# Patient Record
Sex: Male | Born: 1955
Health system: Southern US, Community
[De-identification: ages and names within clinical notes are randomized; demographics above are authoritative.]

## PROBLEM LIST (undated history)

## (undated) DIAGNOSIS — E785 Hyperlipidemia, unspecified: Secondary | ICD-10-CM

## (undated) DIAGNOSIS — I219 Acute myocardial infarction, unspecified: Secondary | ICD-10-CM

## (undated) DIAGNOSIS — Z9289 Personal history of other medical treatment: Secondary | ICD-10-CM

## (undated) DIAGNOSIS — G4730 Sleep apnea, unspecified: Secondary | ICD-10-CM

## (undated) DIAGNOSIS — I480 Paroxysmal atrial fibrillation: Secondary | ICD-10-CM

## (undated) DIAGNOSIS — K219 Gastro-esophageal reflux disease without esophagitis: Secondary | ICD-10-CM

## (undated) DIAGNOSIS — I4892 Unspecified atrial flutter: Secondary | ICD-10-CM

## (undated) DIAGNOSIS — I10 Essential (primary) hypertension: Secondary | ICD-10-CM

## (undated) DIAGNOSIS — I2510 Atherosclerotic heart disease of native coronary artery without angina pectoris: Secondary | ICD-10-CM

## (undated) DIAGNOSIS — I4510 Unspecified right bundle-branch block: Secondary | ICD-10-CM

## (undated) DIAGNOSIS — E1165 Type 2 diabetes mellitus with hyperglycemia: Secondary | ICD-10-CM

## (undated) HISTORY — DX: Hyperlipidemia, unspecified: E78.5

## (undated) HISTORY — DX: Type 2 diabetes mellitus with hyperglycemia: E11.65

## (undated) HISTORY — DX: Gastro-esophageal reflux disease without esophagitis: K21.9

## (undated) HISTORY — DX: Unspecified right bundle-branch block: I45.10

## (undated) HISTORY — DX: Paroxysmal atrial fibrillation: I48.0

## (undated) HISTORY — DX: Atherosclerotic heart disease of native coronary artery without angina pectoris: I25.10

## (undated) HISTORY — DX: Unspecified atrial flutter: I48.92

## (undated) HISTORY — DX: Essential (primary) hypertension: I10

---

## 2004-11-04 ENCOUNTER — Ambulatory Visit: Payer: Self-pay | Admitting: Internal Medicine

## 2009-06-28 ENCOUNTER — Emergency Department: Payer: Self-pay

## 2010-07-04 DIAGNOSIS — I2510 Atherosclerotic heart disease of native coronary artery without angina pectoris: Secondary | ICD-10-CM

## 2010-07-04 HISTORY — DX: Atherosclerotic heart disease of native coronary artery without angina pectoris: I25.10

## 2011-08-02 ENCOUNTER — Inpatient Hospital Stay: Payer: Self-pay | Admitting: *Deleted

## 2011-08-02 LAB — CBC
HCT: 43.1 % (ref 40.0–52.0)
HGB: 14.3 g/dL (ref 13.0–18.0)
MCH: 31.1 pg (ref 26.0–34.0)
MCHC: 33.2 g/dL (ref 32.0–36.0)
MCV: 94 fL (ref 80–100)
Platelet: 265 10*3/uL (ref 150–440)
RBC: 4.59 10*6/uL (ref 4.40–5.90)
RDW: 13.2 % (ref 11.5–14.5)
WBC: 11 10*3/uL — ABNORMAL HIGH (ref 3.8–10.6)

## 2011-08-02 LAB — BASIC METABOLIC PANEL
BUN: 15 mg/dL (ref 7–18)
Creatinine: 0.97 mg/dL (ref 0.60–1.30)
EGFR (African American): >60
Glucose: 106 mg/dL — ABNORMAL HIGH (ref 65–99)
Potassium: 3.7 mmol/L (ref 3.5–5.1)

## 2011-08-02 LAB — CK TOTAL AND CKMB (NOT AT ARMC)
CK, Total: 212 U/L (ref 35–232)
CK-MB: 6.4 ng/mL — ABNORMAL HIGH (ref 0.5–3.6)

## 2011-08-02 LAB — TROPONIN I: Troponin-I: 0.56 ng/mL — ABNORMAL HIGH

## 2011-08-03 DIAGNOSIS — I517 Cardiomegaly: Secondary | ICD-10-CM

## 2011-08-03 DIAGNOSIS — I2510 Atherosclerotic heart disease of native coronary artery without angina pectoris: Secondary | ICD-10-CM | POA: Insufficient documentation

## 2011-08-03 DIAGNOSIS — R748 Abnormal levels of other serum enzymes: Secondary | ICD-10-CM

## 2011-08-03 DIAGNOSIS — R079 Chest pain, unspecified: Secondary | ICD-10-CM

## 2011-08-03 HISTORY — PX: CARDIAC CATHETERIZATION: SHX172

## 2011-08-03 LAB — LIPID PANEL
HDL Cholesterol: 49 mg/dL (ref 40–60)
Triglycerides: 89 mg/dL (ref 0–200)
VLDL Cholesterol, Calc: 18 mg/dL (ref 5–40)

## 2011-08-03 LAB — CBC WITH DIFFERENTIAL/PLATELET
Basophil #: 0 10*3/uL (ref 0.0–0.1)
Basophil %: 0.2 %
Eosinophil #: 0.4 10*3/uL (ref 0.0–0.7)
HCT: 39 % — ABNORMAL LOW (ref 40.0–52.0)
HGB: 13.1 g/dL (ref 13.0–18.0)
Lymphocyte #: 2.4 10*3/uL (ref 1.0–3.6)
Lymphocyte %: 23.6 %
MCHC: 33.7 g/dL (ref 32.0–36.0)
MCV: 94 fL (ref 80–100)
Neutrophil #: 6.4 10*3/uL (ref 1.4–6.5)
Neutrophil %: 63.2 %
Platelet: 250 10*3/uL (ref 150–440)
RDW: 13.1 % (ref 11.5–14.5)

## 2011-08-03 LAB — BASIC METABOLIC PANEL
BUN: 15 mg/dL (ref 7–18)
Calcium, Total: 8.6 mg/dL (ref 8.5–10.1)
Co2: 24 mmol/L (ref 21–32)
EGFR (African American): >60
EGFR (Non-African Amer.): >60
Glucose: 216 mg/dL — ABNORMAL HIGH (ref 65–99)
Potassium: 3.9 mmol/L (ref 3.5–5.1)
Sodium: 137 mmol/L (ref 136–145)

## 2011-08-03 LAB — CK TOTAL AND CKMB (NOT AT ARMC)
CK, Total: 133 U/L (ref 35–232)
CK, Total: 105 U/L (ref 35–232)
CK-MB: 3.3 ng/mL (ref 0.5–3.6)
CK-MB: 1.8 ng/mL (ref 0.5–3.6)

## 2011-08-03 LAB — HEMOGLOBIN A1C: Hemoglobin A1C: 7.4 % — ABNORMAL HIGH (ref 4.2–6.3)

## 2011-08-03 LAB — MAGNESIUM: Magnesium: 1.6 mg/dL — ABNORMAL LOW

## 2011-08-03 LAB — TROPONIN I: Troponin-I: 0.36 ng/mL — ABNORMAL HIGH

## 2011-08-04 DIAGNOSIS — I214 Non-ST elevation (NSTEMI) myocardial infarction: Secondary | ICD-10-CM

## 2011-08-10 ENCOUNTER — Telehealth: Payer: Self-pay | Admitting: Cardiovascular Disease

## 2011-08-10 ENCOUNTER — Observation Stay: Payer: Self-pay | Admitting: Internal Medicine

## 2011-08-10 LAB — TROPONIN I: Troponin-I: <0.02 ng/mL

## 2011-08-10 LAB — BASIC METABOLIC PANEL
Anion Gap: 6 — ABNORMAL LOW (ref 7–16)
Calcium, Total: 9.3 mg/dL (ref 8.5–10.1)
Creatinine: 0.95 mg/dL (ref 0.60–1.30)
EGFR (African American): >60
Osmolality: 280 (ref 275–301)
Potassium: 4.1 mmol/L (ref 3.5–5.1)
Sodium: 139 mmol/L (ref 136–145)

## 2011-08-10 LAB — CBC
HCT: 40.5 % (ref 40.0–52.0)
HGB: 13.5 g/dL (ref 13.0–18.0)
MCH: 31.5 pg (ref 26.0–34.0)
MCV: 94 fL (ref 80–100)
Platelet: 251 10*3/uL (ref 150–440)
RDW: 13.1 % (ref 11.5–14.5)
WBC: 9 10*3/uL (ref 3.8–10.6)

## 2011-08-10 LAB — CK TOTAL AND CKMB (NOT AT ARMC): CK-MB: 2.2 ng/mL (ref 0.5–3.6)

## 2011-08-10 NOTE — Telephone Encounter (Signed)
Complaining of a "nagging pain" most of the day.  He states he was hospitalized last week and has been having the pain off and on since that time.  He has tried taking one ntg without relief.  The pain is in the center of his chest, dull (3-/10) lasting about 2-3 hours.  Eating helps relieve the pain.  TUMS may help a little.  BP is running 120-135/60-70.  HR is 50's.  No sob.  No weakness.  No dizziness.  No sweating.   Walking does make the pain worse.  Pain does not seem to bother him when he lays down at night.  He is not having the pain currently.

## 2011-08-10 NOTE — Telephone Encounter (Signed)
Still having some chest pain and wants to speak w/RN.  PT is post hospital

## 2011-08-10 NOTE — Telephone Encounter (Signed)
Perhaps he could try over-the-counter omeprazole twice a day Okay to take Pepcid or Zantac generic version with omeprazole Will probably need followup in the clinic. 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 Medical management recommended

## 2011-08-11 DIAGNOSIS — I517 Cardiomegaly: Secondary | ICD-10-CM

## 2011-08-11 DIAGNOSIS — R079 Chest pain, unspecified: Secondary | ICD-10-CM

## 2011-08-11 LAB — BASIC METABOLIC PANEL
Anion Gap: 8 (ref 7–16)
Calcium, Total: 8.4 mg/dL — ABNORMAL LOW (ref 8.5–10.1)
Creatinine: 1.02 mg/dL (ref 0.60–1.30)
EGFR (African American): >60
Glucose: 314 mg/dL — ABNORMAL HIGH (ref 65–99)
Potassium: 4.3 mmol/L (ref 3.5–5.1)
Sodium: 137 mmol/L (ref 136–145)

## 2011-08-11 LAB — CK TOTAL AND CKMB (NOT AT ARMC)
CK, Total: 97 U/L (ref 35–232)
CK-MB: 1.6 ng/mL (ref 0.5–3.6)
CK-MB: 1.6 ng/mL (ref 0.5–3.6)

## 2011-08-11 LAB — CBC WITH DIFFERENTIAL/PLATELET
Basophil #: 0 10*3/uL (ref 0.0–0.1)
Basophil %: 0.4 %
Eosinophil %: 4.4 %
HGB: 12.9 g/dL — ABNORMAL LOW (ref 13.0–18.0)
MCH: 31.3 pg (ref 26.0–34.0)
Monocyte #: 1 x10 3/mm (ref 0.2–1.0)
Monocyte %: 10.9 %
Neutrophil #: 5 10*3/uL (ref 1.4–6.5)
Neutrophil %: 53.4 %
RDW: 13.1 % (ref 11.5–14.5)
WBC: 9.4 10*3/uL (ref 3.8–10.6)

## 2011-08-17 ENCOUNTER — Encounter: Payer: Self-pay | Admitting: Cardiovascular Disease

## 2011-08-19 ENCOUNTER — Ambulatory Visit (INDEPENDENT_AMBULATORY_CARE_PROVIDER_SITE_OTHER): Payer: 59 | Admitting: Cardiovascular Disease

## 2011-08-19 ENCOUNTER — Encounter: Payer: Self-pay | Admitting: Cardiovascular Disease

## 2011-08-19 VITALS — BP 132/70 | HR 58 | Resp 18 | Ht 71.0 in | Wt 224.4 lb

## 2011-08-19 DIAGNOSIS — E1165 Type 2 diabetes mellitus with hyperglycemia: Secondary | ICD-10-CM | POA: Diagnosis present

## 2011-08-19 DIAGNOSIS — I10 Essential (primary) hypertension: Secondary | ICD-10-CM | POA: Insufficient documentation

## 2011-08-19 DIAGNOSIS — R079 Chest pain, unspecified: Secondary | ICD-10-CM

## 2011-08-19 DIAGNOSIS — I2510 Atherosclerotic heart disease of native coronary artery without angina pectoris: Secondary | ICD-10-CM

## 2011-08-19 DIAGNOSIS — E785 Hyperlipidemia, unspecified: Secondary | ICD-10-CM

## 2011-08-19 DIAGNOSIS — E119 Type 2 diabetes mellitus without complications: Secondary | ICD-10-CM

## 2011-08-19 MED ORDER — METOPROLOL TARTRATE 25 MG PO TABS: 12.5000 mg | ORAL_TABLET | Freq: Two times a day (BID) | ORAL | Status: DC

## 2011-08-19 MED ORDER — CLOPIDOGREL BISULFATE 75 MG PO TABS: 75.0000 mg | ORAL_TABLET | Freq: Every day | ORAL | Status: DC

## 2011-08-19 MED ORDER — ISOSORBIDE MONONITRATE ER 30 MG PO TB24: 30.0000 mg | ORAL_TABLET | Freq: Every day | ORAL | Status: DC

## 2011-08-19 MED ORDER — RANOLAZINE ER 1000 MG PO TB12: 1000.0000 mg | ORAL_TABLET | Freq: Two times a day (BID) | ORAL | Status: DC

## 2011-08-19 MED ORDER — ATORVASTATIN CALCIUM 40 MG PO TABS: 40.0000 mg | ORAL_TABLET | Freq: Every day | ORAL | Status: DC

## 2011-08-19 MED ORDER — BENAZEPRIL HCL 20 MG PO TABS: 20.0000 mg | ORAL_TABLET | Freq: Every day | ORAL | Status: DC

## 2011-08-19 NOTE — Assessment & Plan Note (Signed)
Severe left circumflex disease with collaterals. Medical management recommended.

## 2011-08-19 NOTE — Progress Notes (Signed)
Patient ID: Douglas Hunter, male    DOB: 10-Jan-1956, 56 y.o.   MRN: [id number]  HPI Comments: Douglas Hunter is a pleasant 56 year old gentleman with recent history of chest pain, non-STEMI with admission to the hospital in late April with cardiac catheterization showing severe mid left circumflex disease, subtotally occluded, with collaterals from the RCA to the LAD circumflex, normal ejection fraction who read presented to the hospital shortly later with chest pain. He was started on nitrates and ranexa for his symptoms.   He presents today and reports that he feels well. He denies any further chest pain. He is uncertain if symptoms were made better by starting proton pump inhibitor or ranexa. He is otherwise active. He continues to take isosorbide 30 mg daily. He is tolerating his statin well. He reports that his hemoglobin A1c is 7.3.  His case was discussed at the cardiac catheterization conference and medical management was recommended.  Echocardiogram shows normal LV systolic function, essentially normal study, May 2013  EKG today shows normal sinus rhythm with right bundle branch block, rate 52 beats per minute  Outpatient Encounter Prescriptions as of 08/19/2011  Medication Sig Dispense Refill  . aspirin 81 MG tablet Take 81 mg by mouth daily.      Marland Kitchen atorvastatin (LIPITOR) 40 MG tablet Take 1 tablet (40 mg total) by mouth daily.  30 tablet  11  . benazepril (LOTENSIN) 20 MG tablet Take 1 tablet (20 mg total) by mouth daily.  30 tablet  11  . clopidogrel (PLAVIX) 75 MG tablet Take 1 tablet (75 mg total) by mouth daily.  30 tablet  11  . insulin lispro protamine-insulin lispro (HUMALOG 75/25) (75-25) 100 UNIT/ML SUSP Inject 50-55 Units into the skin daily. 55 units in the am and 50 units in the pm.      . isosorbide mononitrate (IMDUR) 30 MG 24 hr tablet Take 1 tablet (30 mg total) by mouth daily.  30 tablet  11  . NOVOFINE 30G X 8 MM MISC       . omeprazole (PRILOSEC) 20 MG capsule Take 20  mg by mouth daily.       . ONE TOUCH ULTRA TEST test strip       . ranolazine (RANEXA) 1000 MG SR tablet Take 1 tablet (1,000 mg total) by mouth 2 (two) times daily.  60 tablet  11  . DISCONTD: atorvastatin (LIPITOR) 40 MG tablet Take 40 mg by mouth daily.      Marland Kitchen DISCONTD: benazepril (LOTENSIN) 20 MG tablet Take 20 mg by mouth daily.      Marland Kitchen DISCONTD: clopidogrel (PLAVIX) 75 MG tablet Take 75 mg by mouth daily.      Marland Kitchen DISCONTD: isosorbide mononitrate (IMDUR) 30 MG 24 hr tablet Take 30 mg by mouth daily.      Marland Kitchen DISCONTD: RANEXA 500 MG 12 hr tablet       . metoprolol tartrate (LOPRESSOR) 25 MG tablet Take 0.5 tablets (12.5 mg total) by mouth 2 (two) times daily.  90 tablet  3  . VIAGRA 100 MG tablet         Review of Systems  Constitutional: Negative.   HENT: Negative.   Eyes: Negative.   Respiratory: Negative.   Cardiovascular: Negative.   Gastrointestinal: Negative.   Musculoskeletal: Negative.   Skin: Negative.   Neurological: Negative.   Hematological: Negative.   Psychiatric/Behavioral: Negative.   All other systems reviewed and are negative.   BP 132/70  Pulse 58  Resp 18  Ht 5\' 11"  (1.803 m)  Wt 224 lb 6.4 oz (101.787 kg)  BMI 31.30 kg/m2  SpO2 95%  Physical Exam  Nursing note and vitals reviewed. Constitutional: He is oriented to person, place, and time. He appears well-developed and well-nourished.  HENT:  Head: Normocephalic.  Nose: Nose normal.  Mouth/Throat: Oropharynx is clear and moist.  Eyes: Conjunctivae are normal. Pupils are equal, round, and reactive to light.  Neck: Normal range of motion. Neck supple. No JVD present.  Cardiovascular: Normal rate, regular rhythm, S1 normal, S2 normal, normal heart sounds and intact distal pulses.  Exam reveals no gallop and no friction rub.   No murmur heard. Pulmonary/Chest: Effort normal and breath sounds normal. No respiratory distress. He has no wheezes. He has no rales. He exhibits no tenderness.  Abdominal: Soft.  Bowel sounds are normal. He exhibits no distension. There is no tenderness.  Musculoskeletal: Normal range of motion. He exhibits no edema and no tenderness.  Lymphadenopathy:    He has no cervical adenopathy.  Neurological: He is alert and oriented to person, place, and time. Coordination normal.  Skin: Skin is warm and dry. No rash noted. No erythema.  Psychiatric: He has a normal mood and affect. His behavior is normal. Judgment and thought content normal.           Assessment and Plan

## 2011-08-19 NOTE — Assessment & Plan Note (Signed)
He reports having followup with Dr. Dossie Arbour for routine blood work in the next few months. Call LDL less than 70 .

## 2011-08-19 NOTE — Assessment & Plan Note (Signed)
No recent chest pain. We have suggested he could hold his ranexa for a trial to see if his chest pain will recur. If chest pain does come back, we would continue him on 1000 mg twice a day.

## 2011-08-19 NOTE — Assessment & Plan Note (Signed)
Blood pressure is well controlled on today's visit. No changes made to the medications. 

## 2011-08-19 NOTE — Patient Instructions (Addendum)
You are doing well. No medication changes were made.  You can do a trial without the ranexa  Ranexa is used for chest pain If you continue to have chest pain, restart the ranexa  Please call us if you have new issues that need to be addressed before your next appt.  Your physician wants you to follow-up in: 6 months.  You will receive a reminder letter in the mail two months in advance. If you don't receive a letter, please call our office to schedule the follow-up appointment.

## 2011-10-10 ENCOUNTER — Other Ambulatory Visit: Payer: Self-pay | Admitting: Cardiovascular Disease

## 2011-10-10 NOTE — Telephone Encounter (Signed)
Refilled Omeprazole 

## 2012-02-20 ENCOUNTER — Encounter: Payer: Self-pay | Admitting: Cardiovascular Disease

## 2012-02-20 ENCOUNTER — Ambulatory Visit (INDEPENDENT_AMBULATORY_CARE_PROVIDER_SITE_OTHER): Payer: 59 | Admitting: Cardiovascular Disease

## 2012-02-20 VITALS — BP 142/64 | HR 57 | Ht 71.0 in | Wt 239.2 lb

## 2012-02-20 DIAGNOSIS — I499 Cardiac arrhythmia, unspecified: Secondary | ICD-10-CM

## 2012-02-20 DIAGNOSIS — I10 Essential (primary) hypertension: Secondary | ICD-10-CM

## 2012-02-20 DIAGNOSIS — I2510 Atherosclerotic heart disease of native coronary artery without angina pectoris: Secondary | ICD-10-CM

## 2012-02-20 DIAGNOSIS — E785 Hyperlipidemia, unspecified: Secondary | ICD-10-CM

## 2012-02-20 DIAGNOSIS — E119 Type 2 diabetes mellitus without complications: Secondary | ICD-10-CM

## 2012-02-20 MED ORDER — NITROGLYCERIN 0.4 MG SL SUBL: 0.4000 mg | SUBLINGUAL_TABLET | SUBLINGUAL | Status: DC | PRN

## 2012-02-20 NOTE — Assessment & Plan Note (Signed)
We have encouraged continued exercise, careful diet management in an effort to lose weight. 

## 2012-02-20 NOTE — Assessment & Plan Note (Signed)
Blood pressure is adequate on today's visit. No changes made to the medications.  

## 2012-02-20 NOTE — Patient Instructions (Addendum)
You are doing well. No medication changes were made.  For palpitations, take extra metoprolol whole pill (25 mg)  Please call us if you have new issues that need to be addressed before your next appt.  Your physician wants you to follow-up in: 6 months.  You will receive a reminder letter in the mail two months in advance. If you don't receive a letter, please call our office to schedule the follow-up appointment.

## 2012-02-20 NOTE — Assessment & Plan Note (Signed)
Continue statin. Goal LDL less than 70 

## 2012-02-20 NOTE — Progress Notes (Signed)
Patient ID: Douglas Hunter, male    DOB: 10-20-1955, 56 y.o.   MRN: [id number]  HPI Comments: Douglas Hunter is a pleasant 55 year old gentleman with CAD, non-STEMI with admission to the hospital in late April 2012 with cardiac catheterization showing severe mid left circumflex disease, subtotally occluded, with collaterals from the RCA to the LAD circumflex, normal ejection fraction who represented to the hospital shortly later with chest pain. He was started on nitrates and ranexa for his symptoms.   He presents today and reports that he feels well. He denies any further chest pain. He is otherwise active. He continues to take isosorbide 30 mg daily. He is tolerating his statin well. He does not exercise on a regular basis he has been having some palpitations at nighttime, rarely in the daytime. This is rare, sometimes lasting for 30 minutes Rare chest pain episodes at rest.  sometimes has to take nitroglycerin  He reports that his hemoglobin A1c is greater than 8, up from 7.3  Echocardiogram shows normal LV systolic function, essentially normal study, May 2013  EKG today shows normal sinus rhythm with right bundle branch block, rate 57 beats per minute  Outpatient Encounter Prescriptions as of 02/20/2012  Medication Sig Dispense Refill  . aspirin 81 MG tablet Take 81 mg by mouth daily.      Marland Kitchen atorvastatin (LIPITOR) 40 MG tablet Take 1 tablet (40 mg total) by mouth daily.  30 tablet  11  . benazepril (LOTENSIN) 20 MG tablet Take 1 tablet (20 mg total) by mouth daily.  30 tablet  11  . clopidogrel (PLAVIX) 75 MG tablet Take 1 tablet (75 mg total) by mouth daily.  30 tablet  11  . insulin lispro protamine-insulin lispro (HUMALOG 75/25) (75-25) 100 UNIT/ML SUSP Inject 50-55 Units into the skin daily. 55 units in the am and 50 units in the pm.      . isosorbide mononitrate (IMDUR) 30 MG 24 hr tablet Take 1 tablet (30 mg total) by mouth daily.  30 tablet  11  . metoprolol tartrate (LOPRESSOR) 25 MG  tablet Take 0.5 tablets (12.5 mg total) by mouth 2 (two) times daily.  90 tablet  3  . NOVOFINE 30G X 8 MM MISC       . omeprazole (PRILOSEC) 20 MG capsule TAKE 1 TABLET BY MOUTH EVERY DAY  30 capsule  5  . ONE TOUCH ULTRA TEST test strip       . ranolazine (RANEXA) 1000 MG SR tablet Take 1 tablet (1,000 mg total) by mouth 2 (two) times daily.  60 tablet  11  . VIAGRA 100 MG tablet Take 100 mg by mouth as needed.       . nitroGLYCERIN (NITROSTAT) 0.4 MG SL tablet Place 1 tablet (0.4 mg total) under the tongue every 5 (five) minutes as needed for chest pain.  25 tablet  6     Review of Systems  Constitutional: Negative.   HENT: Negative.   Eyes: Negative.   Respiratory: Negative.   Cardiovascular: Positive for chest pain and palpitations.  Gastrointestinal: Negative.   Musculoskeletal: Negative.   Skin: Negative.   Neurological: Negative.   Hematological: Negative.   Psychiatric/Behavioral: Negative.   All other systems reviewed and are negative.   BP 142/64  Pulse 57  Ht 5\' 11"  (1.803 m)  Wt 239 lb 4 oz (108.523 kg)  BMI 33.37 kg/m2  Physical Exam  Nursing note and vitals reviewed. Constitutional: He is oriented to person, place, and time.  He appears well-developed and well-nourished.  HENT:  Head: Normocephalic.  Nose: Nose normal.  Mouth/Throat: Oropharynx is clear and moist.  Eyes: Conjunctivae normal are normal. Pupils are equal, round, and reactive to light.  Neck: Normal Hunter of motion. Neck supple. No JVD present.  Cardiovascular: Normal rate, regular rhythm, S1 normal, S2 normal, normal heart sounds and intact distal pulses.  Exam reveals no gallop and no friction rub.   No murmur heard. Pulmonary/Chest: Effort normal and breath sounds normal. No respiratory distress. He has no wheezes. He has no rales. He exhibits no tenderness.  Abdominal: Soft. Bowel sounds are normal. He exhibits no distension. There is no tenderness.  Musculoskeletal: Normal Hunter of motion.  He exhibits no edema and no tenderness.  Lymphadenopathy:    He has no cervical adenopathy.  Neurological: He is alert and oriented to person, place, and time. Coordination normal.  Skin: Skin is warm and dry. No rash noted. No erythema.  Psychiatric: He has a normal mood and affect. His behavior is normal. Judgment and thought content normal.           Assessment and Plan

## 2012-02-20 NOTE — Assessment & Plan Note (Signed)
Currently with no symptoms of angina. No further workup at this time. Continue current medication regimen. Atypical type chest pain at rest. We have encouraged him to call our office if symptoms change.

## 2012-08-03 ENCOUNTER — Other Ambulatory Visit: Payer: Self-pay | Admitting: *Deleted

## 2012-08-03 MED ORDER — METOPROLOL TARTRATE 25 MG PO TABS: 12.5000 mg | ORAL_TABLET | Freq: Two times a day (BID) | ORAL | Status: DC

## 2012-08-03 NOTE — Telephone Encounter (Signed)
Refilled Metoprolol sent to [facility] pharmacy.

## 2012-08-11 ENCOUNTER — Inpatient Hospital Stay: Payer: Self-pay | Admitting: Specialist

## 2012-08-11 DIAGNOSIS — R7989 Other specified abnormal findings of blood chemistry: Secondary | ICD-10-CM

## 2012-08-11 DIAGNOSIS — I4892 Unspecified atrial flutter: Secondary | ICD-10-CM

## 2012-08-11 LAB — COMPREHENSIVE METABOLIC PANEL
Albumin: 3.4 g/dL (ref 3.4–5.0)
Alkaline Phosphatase: 121 U/L (ref 50–136)
Anion Gap: 7 (ref 7–16)
BUN: 18 mg/dL (ref 7–18)
Bilirubin,Total: 1 mg/dL (ref 0.2–1.0)
Calcium, Total: 8.9 mg/dL (ref 8.5–10.1)
Co2: 24 mmol/L (ref 21–32)
Creatinine: 1.16 mg/dL (ref 0.60–1.30)
EGFR (Non-African Amer.): >60
Osmolality: 286 (ref 275–301)
SGOT(AST): 21 U/L (ref 15–37)

## 2012-08-11 LAB — CBC
HCT: 42.2 % (ref 40.0–52.0)
HGB: 14.6 g/dL (ref 13.0–18.0)
MCH: 31.7 pg (ref 26.0–34.0)
RBC: 4.6 10*6/uL (ref 4.40–5.90)
RDW: 13.4 % (ref 11.5–14.5)
WBC: 10 10*3/uL (ref 3.8–10.6)

## 2012-08-11 LAB — TROPONIN I: Troponin-I: 0.21 ng/mL — ABNORMAL HIGH

## 2012-08-11 LAB — PROTIME-INR: INR: 0.9

## 2012-08-11 LAB — TSH: Thyroid Stimulating Horm: 1.67 u[IU]/mL

## 2012-08-11 LAB — MAGNESIUM: Magnesium: 1.2 mg/dL — ABNORMAL LOW

## 2012-08-11 LAB — CK TOTAL AND CKMB (NOT AT ARMC): CK-MB: 5.2 ng/mL — ABNORMAL HIGH (ref 0.5–3.6)

## 2012-08-11 LAB — APTT: Activated PTT: 27.2 secs (ref 23.6–35.9)

## 2012-08-12 LAB — CK TOTAL AND CKMB (NOT AT ARMC)
CK, Total: 126 U/L (ref 35–232)
CK-MB: 6.6 ng/mL — ABNORMAL HIGH (ref 0.5–3.6)

## 2012-08-12 LAB — BASIC METABOLIC PANEL
Anion Gap: 4 — ABNORMAL LOW (ref 7–16)
BUN: 15 mg/dL (ref 7–18)
Calcium, Total: 8.5 mg/dL (ref 8.5–10.1)
EGFR (African American): >60
EGFR (Non-African Amer.): >60
Osmolality: 281 (ref 275–301)
Sodium: 137 mmol/L (ref 136–145)

## 2012-08-12 LAB — CBC WITH DIFFERENTIAL/PLATELET
Eosinophil #: 0.4 10*3/uL (ref 0.0–0.7)
Eosinophil %: 3.2 %
Lymphocyte %: 26.8 %
Monocyte %: 9 %
Neutrophil #: 6.5 10*3/uL (ref 1.4–6.5)
Neutrophil %: 58.2 %
Platelet: 221 10*3/uL (ref 150–440)
RDW: 13.5 % (ref 11.5–14.5)

## 2012-08-12 LAB — APTT: Activated PTT: 103.1 secs — ABNORMAL HIGH (ref 23.6–35.9)

## 2012-08-12 LAB — TROPONIN I: Troponin-I: 0.28 ng/mL — ABNORMAL HIGH

## 2012-08-12 LAB — LIPID PANEL
HDL Cholesterol: 51 mg/dL (ref 40–60)
Triglycerides: 78 mg/dL (ref 0–200)
VLDL Cholesterol, Calc: 16 mg/dL (ref 5–40)

## 2012-08-16 ENCOUNTER — Other Ambulatory Visit: Payer: Self-pay

## 2012-08-16 ENCOUNTER — Other Ambulatory Visit: Payer: 59

## 2012-08-16 ENCOUNTER — Other Ambulatory Visit (INDEPENDENT_AMBULATORY_CARE_PROVIDER_SITE_OTHER): Payer: 59

## 2012-08-16 DIAGNOSIS — I10 Essential (primary) hypertension: Secondary | ICD-10-CM

## 2012-08-16 DIAGNOSIS — I4892 Unspecified atrial flutter: Secondary | ICD-10-CM

## 2012-08-16 DIAGNOSIS — I2510 Atherosclerotic heart disease of native coronary artery without angina pectoris: Secondary | ICD-10-CM

## 2012-08-16 DIAGNOSIS — I499 Cardiac arrhythmia, unspecified: Secondary | ICD-10-CM

## 2012-08-16 DIAGNOSIS — E785 Hyperlipidemia, unspecified: Secondary | ICD-10-CM

## 2012-08-20 ENCOUNTER — Encounter: Payer: Self-pay | Admitting: Cardiovascular Disease

## 2012-08-20 ENCOUNTER — Ambulatory Visit (INDEPENDENT_AMBULATORY_CARE_PROVIDER_SITE_OTHER): Payer: 59 | Admitting: Cardiovascular Disease

## 2012-08-20 VITALS — BP 128/74 | HR 53 | Ht 71.0 in | Wt 244.5 lb

## 2012-08-20 DIAGNOSIS — R000 Tachycardia, unspecified: Secondary | ICD-10-CM

## 2012-08-20 DIAGNOSIS — I10 Essential (primary) hypertension: Secondary | ICD-10-CM

## 2012-08-20 DIAGNOSIS — I4892 Unspecified atrial flutter: Secondary | ICD-10-CM | POA: Insufficient documentation

## 2012-08-20 DIAGNOSIS — E119 Type 2 diabetes mellitus without complications: Secondary | ICD-10-CM

## 2012-08-20 DIAGNOSIS — E785 Hyperlipidemia, unspecified: Secondary | ICD-10-CM

## 2012-08-20 DIAGNOSIS — I2510 Atherosclerotic heart disease of native coronary artery without angina pectoris: Secondary | ICD-10-CM

## 2012-08-20 MED ORDER — METOPROLOL TARTRATE 25 MG PO TABS: 25.0000 mg | ORAL_TABLET | Freq: Two times a day (BID) | ORAL | Status: DC

## 2012-08-20 MED ORDER — DILTIAZEM HCL 30 MG PO TABS: 30.0000 mg | ORAL_TABLET | Freq: Four times a day (QID) | ORAL | Status: DC | PRN

## 2012-08-20 NOTE — Progress Notes (Signed)
Patient ID: Douglas Hunter, male    DOB: 1955-05-04, 57 y.o.   MRN: [id number]  HPI Comments: Douglas Hunter is a pleasant 57 year old gentleman with CAD, non-STEMI with admission to the hospital in late April 2012 with cardiac catheterization showing severe mid left circumflex disease, subtotally occluded, with collaterals from the RCA to the LAD circumflex, normal ejection fraction who represented to the hospital shortly later with chest pain. He was started on nitrates and ranexa for his symptoms.   He has had 4 episodes of atrial flutter. Most recently admitted to the hospital 08/11/2012 with atrial flutter. Flutter waves were seen after adenosine was given IV. He was started on diltiazem infusion and his rhythm broke to normal sinus rhythm. He presents today for followup. He was started on xarelto. Metoprolol was increased. Otherwise active with no complaints  Echocardiogram shows normal LV systolic function, essentially normal study, May 2013  Recent lab work shows LDL 81, hemoglobin A1c 8.1, total cholesterol 144, HDL 50 In the hospital total cholesterol 130, LDL 30, HDL 51  EKG today shows normal sinus rhythm with right bundle branch block, rate 53 beats per minute  Outpatient Encounter Prescriptions as of 08/20/2012  Medication Sig Dispense Refill  . aspirin 81 MG tablet Take 81 mg by mouth daily.      Marland Kitchen atorvastatin (LIPITOR) 40 MG tablet Take 1 tablet (40 mg total) by mouth daily.  30 tablet  11  . benazepril (LOTENSIN) 20 MG tablet Take 1 tablet (20 mg total) by mouth daily.  30 tablet  11  . HUMULIN 70/30 (70-30) 100 UNIT/ML injection 60 units am and 50 units pm daily.      . isosorbide mononitrate (IMDUR) 30 MG 24 hr tablet Take 1 tablet (30 mg total) by mouth daily.  30 tablet  11  . metoprolol tartrate (LOPRESSOR) 25 MG tablet Take 25 mg by mouth 2 (two) times daily.      . nitroGLYCERIN (NITROSTAT) 0.4 MG SL tablet Place 1 tablet (0.4 mg total) under the tongue every 5 (five)  minutes as needed for chest pain.  25 tablet  6  . NOVOFINE 30G X 8 MM MISC       . ONE TOUCH ULTRA TEST test strip       . VIAGRA 100 MG tablet Take 100 mg by mouth as needed.       Douglas Hunter 20 MG TABS 20 mg daily.       . [DISCONTINUED] metoprolol tartrate (LOPRESSOR) 25 MG tablet Take 0.5 tablets (12.5 mg total) by mouth 2 (two) times daily.  90 tablet  3  . [DISCONTINUED] clopidogrel (PLAVIX) 75 MG tablet Take 1 tablet (75 mg total) by mouth daily.  30 tablet  11  . [DISCONTINUED] insulin lispro protamine-insulin lispro (HUMALOG 75/25) (75-25) 100 UNIT/ML SUSP Inject 50-55 Units into the skin daily. 55 units in the am and 50 units in the pm.      . [DISCONTINUED] omeprazole (PRILOSEC) 20 MG capsule TAKE 1 TABLET BY MOUTH EVERY DAY  30 capsule  5  . [DISCONTINUED] ranolazine (RANEXA) 1000 MG SR tablet Take 1 tablet (1,000 mg total) by mouth 2 (two) times daily.  60 tablet  11   No facility-administered encounter medications on file as of 08/20/2012.     Review of Systems  Constitutional: Negative.   HENT: Negative.   Eyes: Negative.   Respiratory: Negative.   Gastrointestinal: Negative.   Musculoskeletal: Negative.   Skin: Negative.   Neurological: Negative.  Psychiatric/Behavioral: Negative.   All other systems reviewed and are negative.   BP 128/74  Pulse 53  Ht 5\' 11"  (1.803 m)  Wt 244 lb 8 oz (110.904 kg)  BMI 34.12 kg/m2  Physical Exam  Nursing note and vitals reviewed. Constitutional: He is oriented to person, place, and time. He appears well-developed and well-nourished.  HENT:  Head: Normocephalic.  Nose: Nose normal.  Mouth/Throat: Oropharynx is clear and moist.  Eyes: Conjunctivae are normal. Pupils are equal, round, and reactive to light.  Neck: Normal range of motion. Neck supple. No JVD present.  Cardiovascular: Normal rate, regular rhythm, S1 normal, S2 normal, normal heart sounds and intact distal pulses.  Exam reveals no gallop and no friction rub.   No  murmur heard. Pulmonary/Chest: Effort normal and breath sounds normal. No respiratory distress. He has no wheezes. He has no rales. He exhibits no tenderness.  Abdominal: Soft. Bowel sounds are normal. He exhibits no distension. There is no tenderness.  Musculoskeletal: Normal range of motion. He exhibits no edema and no tenderness.  Lymphadenopathy:    He has no cervical adenopathy.  Neurological: He is alert and oriented to person, place, and time. Coordination normal.  Skin: Skin is warm and dry. No rash noted. No erythema.  Psychiatric: He has a normal mood and affect. His behavior is normal. Judgment and thought content normal.      Assessment and Plan        And

## 2012-08-20 NOTE — Assessment & Plan Note (Signed)
Encouraged him to stay on his Lipitor 

## 2012-08-20 NOTE — Assessment & Plan Note (Signed)
Blood pressure is well controlled on today's visit. No changes made to the medications. 

## 2012-08-20 NOTE — Patient Instructions (Addendum)
You are doing well. Please take diltiazem as needed with metoprolol for fast rhythm episodes  Please call us if you have new issues that need to be addressed before your next appt.  Your physician wants you to follow-up in: 6 months.  You will receive a reminder letter in the mail two months in advance. If you don't receive a letter, please call our office to schedule the follow-up appointment.

## 2012-08-20 NOTE — Assessment & Plan Note (Signed)
Currently with no symptoms of angina. No further workup at this time. Continue current medication regimen. 

## 2012-08-20 NOTE — Assessment & Plan Note (Signed)
He'll continue on anticoagulation and metoprolol. We have given him diltiazem to take as needed for arrhythmia

## 2012-08-20 NOTE — Assessment & Plan Note (Signed)
We have encouraged continued exercise, careful diet management in an effort to lose weight. Hemoglobin A1c is elevated

## 2012-09-03 ENCOUNTER — Other Ambulatory Visit: Payer: Self-pay

## 2012-09-03 MED ORDER — BENAZEPRIL HCL 20 MG PO TABS: 20.0000 mg | ORAL_TABLET | Freq: Every day | ORAL | Status: DC

## 2012-09-03 NOTE — Telephone Encounter (Signed)
Benazepril 

## 2012-09-03 NOTE — Telephone Encounter (Signed)
Refill sent for Benazepril 20 mg

## 2012-09-20 ENCOUNTER — Other Ambulatory Visit: Payer: Self-pay | Admitting: *Deleted

## 2012-09-20 MED ORDER — ISOSORBIDE MONONITRATE ER 30 MG PO TB24: 30.0000 mg | ORAL_TABLET | Freq: Every day | ORAL | Status: DC

## 2012-09-20 MED ORDER — ATORVASTATIN CALCIUM 40 MG PO TABS: 40.0000 mg | ORAL_TABLET | Freq: Every day | ORAL | Status: DC

## 2012-09-20 MED ORDER — SILDENAFIL CITRATE 100 MG PO TABS: 100.0000 mg | ORAL_TABLET | ORAL | Status: DC | PRN

## 2012-09-20 NOTE — Telephone Encounter (Signed)
Refilled Viagra #10 Refill#1 Isosorbide #30Refill#6 Atorvastatin #30 Refill#6 sent to [facility] pharmacy.

## 2012-10-31 ENCOUNTER — Other Ambulatory Visit: Payer: Self-pay | Admitting: *Deleted

## 2012-10-31 MED ORDER — RIVAROXABAN 20 MG PO TABS: 20.0000 mg | ORAL_TABLET | Freq: Every day | ORAL | Status: DC

## 2012-10-31 NOTE — Telephone Encounter (Signed)
Refilled Xarelto sent to [facility] pharmacy.

## 2013-01-11 ENCOUNTER — Observation Stay: Payer: Self-pay | Admitting: Internal Medicine

## 2013-01-11 DIAGNOSIS — I4891 Unspecified atrial fibrillation: Secondary | ICD-10-CM

## 2013-01-11 LAB — CBC
HCT: 44.8 % (ref 40.0–52.0)
HGB: 15.5 g/dL (ref 13.0–18.0)
MCH: 32.1 pg (ref 26.0–34.0)
Platelet: 238 10*3/uL (ref 150–440)
RBC: 4.83 10*6/uL (ref 4.40–5.90)
RDW: 13.2 % (ref 11.5–14.5)
WBC: 9 10*3/uL (ref 3.8–10.6)

## 2013-01-11 LAB — PROTIME-INR
INR: 1.1
Prothrombin Time: 14.4 secs (ref 11.5–14.7)

## 2013-01-11 LAB — BASIC METABOLIC PANEL
Chloride: 104 mmol/L (ref 98–107)
Co2: 26 mmol/L (ref 21–32)
EGFR (African American): >60
EGFR (Non-African Amer.): >60
Glucose: 229 mg/dL — ABNORMAL HIGH (ref 65–99)
Osmolality: 280 (ref 275–301)
Potassium: 4.3 mmol/L (ref 3.5–5.1)
Sodium: 136 mmol/L (ref 136–145)

## 2013-01-12 LAB — CBC WITH DIFFERENTIAL/PLATELET
Basophil #: 0.1 10*3/uL (ref 0.0–0.1)
Basophil %: 0.7 %
Eosinophil %: 3.8 %
HGB: 13 g/dL (ref 13.0–18.0)
Lymphocyte #: 2.5 10*3/uL (ref 1.0–3.6)
Lymphocyte %: 29.3 %
MCH: 32.1 pg (ref 26.0–34.0)
Monocyte %: 12.6 %
Neutrophil %: 53.6 %
RBC: 4.05 10*6/uL — ABNORMAL LOW (ref 4.40–5.90)

## 2013-01-12 LAB — LIPID PANEL
Ldl Cholesterol, Calc: 49 mg/dL (ref 0–100)
VLDL Cholesterol, Calc: 16 mg/dL (ref 5–40)

## 2013-01-12 LAB — BASIC METABOLIC PANEL
Calcium, Total: 8.4 mg/dL — ABNORMAL LOW (ref 8.5–10.1)
Chloride: 104 mmol/L (ref 98–107)
Co2: 24 mmol/L (ref 21–32)
Creatinine: 1.25 mg/dL (ref 0.60–1.30)
EGFR (African American): >60
EGFR (Non-African Amer.): >60
Glucose: 281 mg/dL — ABNORMAL HIGH (ref 65–99)
Osmolality: 283 (ref 275–301)

## 2013-01-12 LAB — TSH: Thyroid Stimulating Horm: 2.28 u[IU]/mL

## 2013-01-15 ENCOUNTER — Ambulatory Visit (INDEPENDENT_AMBULATORY_CARE_PROVIDER_SITE_OTHER): Payer: 59 | Admitting: Cardiovascular Disease

## 2013-01-15 ENCOUNTER — Encounter: Payer: Self-pay | Admitting: *Deleted

## 2013-01-15 ENCOUNTER — Encounter: Payer: Self-pay | Admitting: Cardiovascular Disease

## 2013-01-15 VITALS — BP 162/80 | HR 58 | Ht 71.0 in | Wt 249.2 lb

## 2013-01-15 DIAGNOSIS — R079 Chest pain, unspecified: Secondary | ICD-10-CM

## 2013-01-15 DIAGNOSIS — I10 Essential (primary) hypertension: Secondary | ICD-10-CM

## 2013-01-15 DIAGNOSIS — I2510 Atherosclerotic heart disease of native coronary artery without angina pectoris: Secondary | ICD-10-CM

## 2013-01-15 DIAGNOSIS — I4892 Unspecified atrial flutter: Secondary | ICD-10-CM

## 2013-01-15 DIAGNOSIS — E785 Hyperlipidemia, unspecified: Secondary | ICD-10-CM

## 2013-01-15 MED ORDER — AMLODIPINE BESYLATE 10 MG PO TABS: 10.0000 mg | ORAL_TABLET | Freq: Every day | ORAL | Status: DC

## 2013-01-15 NOTE — Addendum Note (Signed)
Addended by: [name] on: 01/15/2013 06:27 PM   Modules accepted: Level of Service

## 2013-01-15 NOTE — Assessment & Plan Note (Signed)
We have suggested he continue on his statin. Goal LDL less than 70.

## 2013-01-15 NOTE — Assessment & Plan Note (Addendum)
Prior cardiac cath showing severely diseased mid left circumflex, patent LAD and RCA. Collaterals  To the circumflex. Medical management. If he has chest pain , could repeat cardiac catheterization. Most recent troponin elevation likely from tachycardia from arrhythmia.

## 2013-01-15 NOTE — Progress Notes (Signed)
Patient ID: Douglas Hunter, male    DOB: 10-21-1955, 57 y.o.   MRN: [id number]  HPI Comments: Mr. Knecht is a pleasant 57 year old gentleman with CAD, non-STEMI with admission to the hospital in late April 2012 with cardiac catheterization showing severe mid left circumflex disease, subtotally occluded, with collaterals from the RCA to the LAD circumflex, normal ejection fraction who represented to the hospital shortly later with chest pain. He was started on medical management,  nitrates and ranexa for his symptoms.  He has had numerous episodes of atrial flutter. He reports symptoms dating back into his 30s. He has just tolerated it over the past many years. He has had 2 recent hospitalizations for atrial flutter, 08/11/2012 and recently on 01/11/2013. Both episodes required hospitalization, medications for cardioversion. He has had several episodes that broke on their own, one episode 2 months ago.  His most recent admission several days ago was for tachycardia/palpitations. He woke up at 3:30 in the morning with lightheadedness, took his morning metoprolol and diltiazem that he takes when necessary. He had no relief. 2 months prior, the episode didn't break with diltiazem. His wife reports that he has sleep apnea but has not had a sleep study. He has been taking anticoagulation on a consistent basis hemoglobin A1c 8.1. In the hospital, he was given several boluses of diltiazem before converting to normal sinus rhythm. He was started on Multaq twice a day at discharge with his metoprolol twice a day.  Troponin climbed to 1.1 in the setting of rapid rate of 140 beats per minute. Labs showed total cholesterol 112, LDL 49, TSH 2.28   In followup today, he is interested in learning more about ablation. He reports having similar episodes of tachycardia dating back to age 57. Events are happening frequently. He does not go to the emergency room for every episode .   Echocardiogram shows normal LV systolic  function, essentially normal study, May 2013  Recent lab work shows LDL 81, hemoglobin A1c 8.1, total cholesterol 144, HDL 50 In the hospital total cholesterol 130, LDL 30, HDL 51  EKG today shows normal sinus rhythm with right bundle branch block, rate 58 beats per minute  Outpatient Encounter Prescriptions as of 01/15/2013  Medication Sig Dispense Refill  . aspirin 81 MG tablet Take 81 mg by mouth daily.      Marland Kitchen atorvastatin (LIPITOR) 40 MG tablet Take 1 tablet (40 mg total) by mouth daily.  30 tablet  6  . benazepril (LOTENSIN) 20 MG tablet Take 40 mg by mouth daily.      Marland Kitchen diltiazem (CARDIZEM) 30 MG tablet Take 1 tablet (30 mg total) by mouth 4 (four) times daily as needed.  90 tablet  6  . HUMULIN 70/30 (70-30) 100 UNIT/ML injection 60 units am and 50 units pm daily.      . isosorbide mononitrate (IMDUR) 30 MG 24 hr tablet Take 1 tablet (30 mg total) by mouth daily.  30 tablet  6  . metoprolol tartrate (LOPRESSOR) 25 MG tablet Take 1 tablet (25 mg total) by mouth 2 (two) times daily.  180 tablet  3  . MULTAQ 400 MG tablet Take 400 mg by mouth 2 (two) times daily with a meal.       . nitroGLYCERIN (NITROSTAT) 0.4 MG SL tablet Place 1 tablet (0.4 mg total) under the tongue every 5 (five) minutes as needed for chest pain.  25 tablet  6  . ONE TOUCH ULTRA TEST test strip       .  Rivaroxaban (XARELTO) 20 MG TABS Take 1 tablet (20 mg total) by mouth daily.  30 tablet  5  . sildenafil (VIAGRA) 100 MG tablet Take 1 tablet (100 mg total) by mouth as needed.  10 tablet  1  . [DISCONTINUED] benazepril (LOTENSIN) 20 MG tablet Take 1 tablet (20 mg total) by mouth daily.  30 tablet  11  . [DISCONTINUED] NOVOFINE 30G X 8 MM MISC        No facility-administered encounter medications on file as of 01/15/2013.     Review of Systems  Constitutional: Negative.   HENT: Negative.   Eyes: Negative.   Respiratory: Negative.   Cardiovascular: Negative.   Gastrointestinal: Negative.   Endocrine:  Negative.   Musculoskeletal: Negative.   Skin: Negative.   Allergic/Immunologic: Negative.   Neurological: Negative.   Hematological: Negative.   Psychiatric/Behavioral: Negative.   All other systems reviewed and are negative.   BP 162/80  Pulse 58  Ht 5\' 11"  (1.803 m)  Wt 249 lb 4 oz (113.059 kg)  BMI 34.78 kg/m2  Physical Exam  Nursing note and vitals reviewed. Constitutional: He is oriented to person, place, and time. He appears well-developed and well-nourished.  HENT:  Head: Normocephalic.  Nose: Nose normal.  Mouth/Throat: Oropharynx is clear and moist.  Eyes: Conjunctivae are normal. Pupils are equal, round, and reactive to light.  Neck: Normal range of motion. Neck supple. No JVD present.  Cardiovascular: Normal rate, regular rhythm, S1 normal, S2 normal, normal heart sounds and intact distal pulses.  Exam reveals no gallop and no friction rub.   No murmur heard. Pulmonary/Chest: Effort normal and breath sounds normal. No respiratory distress. He has no wheezes. He has no rales. He exhibits no tenderness.  Abdominal: Soft. Bowel sounds are normal. He exhibits no distension. There is no tenderness.  Musculoskeletal: Normal range of motion. He exhibits no edema and no tenderness.  Lymphadenopathy:    He has no cervical adenopathy.  Neurological: He is alert and oriented to person, place, and time. Coordination normal.  Skin: Skin is warm and dry. No rash noted. No erythema.  Psychiatric: He has a normal mood and affect. His behavior is normal. Judgment and thought content normal.      Assessment and Plan        And

## 2013-01-15 NOTE — Assessment & Plan Note (Signed)
No recent episodes of chest pain since his discharge. Continue medical management. Recent climb in his troponin likely from episode of tachycardia January 11, 2013.

## 2013-01-15 NOTE — Assessment & Plan Note (Signed)
Several recent admissions this year for atrial flutter. Other episodes that he has not gone to the hospital for that broke on their own with extra diltiazem. He reports having arrhythmia dating back to his 30s. He is interested in learning more about flutter ablation. At his request, we will refer him to [facility] EP.

## 2013-01-15 NOTE — Assessment & Plan Note (Signed)
Blood pressure is elevated. We will add amlodipine 5 mg daily. Will increase to 10 mg as needed for hypertension.

## 2013-01-15 NOTE — Patient Instructions (Addendum)
You are doing well. Please start amlodipine 1/2 pill per day for high blood pressure If blood pressure continues to run high after one week, Take a full pill  We will set up an appt with Dr. Johney Frame for atrial flutter ablation  Oct 22nd  Please call us if you have new issues that need to be addressed before your next appt.  Your physician wants you to follow-up in: 6 months.  You will receive a reminder letter in the mail two months in advance. If you don't receive a letter, please call our office to schedule the follow-up appointment.

## 2013-01-23 ENCOUNTER — Ambulatory Visit (INDEPENDENT_AMBULATORY_CARE_PROVIDER_SITE_OTHER): Payer: 59 | Admitting: Internal Medicine

## 2013-01-23 ENCOUNTER — Encounter: Payer: Self-pay | Admitting: *Deleted

## 2013-01-23 ENCOUNTER — Encounter: Payer: Self-pay | Admitting: Internal Medicine

## 2013-01-23 VITALS — BP 134/74 | HR 52 | Ht 71.0 in | Wt 248.4 lb

## 2013-01-23 DIAGNOSIS — R0683 Snoring: Secondary | ICD-10-CM | POA: Insufficient documentation

## 2013-01-23 DIAGNOSIS — I4891 Unspecified atrial fibrillation: Secondary | ICD-10-CM

## 2013-01-23 DIAGNOSIS — R0609 Other forms of dyspnea: Secondary | ICD-10-CM

## 2013-01-23 DIAGNOSIS — I4892 Unspecified atrial flutter: Secondary | ICD-10-CM

## 2013-01-23 LAB — BASIC METABOLIC PANEL
BUN: 18 mg/dL (ref 6–23)
Calcium: 9.6 mg/dL (ref 8.4–10.5)
Chloride: 105 mEq/L (ref 96–112)
GFR: 62.92 mL/min (ref >60.00)
Glucose, Bld: 261 mg/dL — ABNORMAL HIGH (ref 70–99)
Sodium: 136 mEq/L (ref 135–145)

## 2013-01-23 LAB — CBC WITH DIFFERENTIAL/PLATELET
Basophils Absolute: 0 10*3/uL (ref 0.0–0.1)
Eosinophils Absolute: 0.3 10*3/uL (ref 0.0–0.7)
Eosinophils Relative: 3.2 % (ref 0.0–5.0)
Hemoglobin: 14.3 g/dL (ref 13.0–17.0)
Lymphocytes Relative: 19.2 % (ref 12.0–46.0)
MCV: 93.1 fl (ref 78.0–100.0)
Monocytes Relative: 10.6 % (ref 3.0–12.0)
Platelets: 267 10*3/uL (ref 150.0–400.0)
RDW: 13.6 % (ref 11.5–14.6)
WBC: 10.9 10*3/uL — ABNORMAL HIGH (ref 4.5–10.5)

## 2013-01-23 NOTE — Patient Instructions (Addendum)
Your physician has recommended that you have an ablation. Catheter ablation is a medical procedure used to treat some cardiac arrhythmias (irregular heartbeats). During catheter ablation, a long, thin, flexible tube is put into a blood vessel in your groin (upper thigh), or neck. This tube is called an ablation catheter. It is then guided to your heart through the blood vessel. Radio frequency waves destroy small areas of heart tissue where abnormal heartbeats may cause an arrhythmia to start. Please see the instruction sheet given to you today.  See instruction sheet   Your physician has recommended that you have a sleep study. This test records several body functions during sleep, including: brain activity, eye movement, oxygen and carbon dioxide blood levels, heart rate and rhythm, breathing rate and rhythm, the flow of air through your mouth and nose, snoring, body muscle movements, and chest and belly movement.

## 2013-01-23 NOTE — Progress Notes (Signed)
Primary Care Physician: Vonita Moss, MD Referring Physician:  Dr Dennison Nancy Dugdale is a 57 y.o. male with a h/o CAD, atrial flutter and atrial fibrillation who presents today for EP consultation.  He had non-STEMI with admission to the hospital in late April 2012 with cardiac catheterization showing severe mid left circumflex disease, subtotally occluded, with collaterals from the RCA to the LAD circumflex, normal ejection fraction.  Medical management was required.  He presented 5/14 with SVT.  He reports that adenosine was not successful in terminating this arrhythmia.  He was placed on IV diltiazem.  In retrospect, he has had similar tachypalpitations in the past.  Per Dr Mariah Milling, he has had numerous episodes of atrial flutter. He has had 2 recent hospitalizations for atrial flutter, 08/11/2012 and recently on 01/11/2013.  Both episodes required hospitalization, medications for rate control. He has had several episodes that broke on their own, one episode 2 months ago.  His most recent admission several days ago was for tachycardia/palpitations. He woke up at 3:30 in the morning with lightheadedness, took his morning metoprolol and diltiazem that he takes when necessary.  He snores but has not had a sleep study. He has been taking anticoagulation on a consistent basis.  He has been initiated medical therapy with Multaq and metoprolol twice a day.  He reports associated GI side effects with this medicine.   Echocardiogram shows normal LV systolic function, essentially normal study, May 2013  Today, he denies symptoms of palpitations, chest pain, shortness of breath, orthopnea, PND, lower extremity edema, dizziness, presyncope, syncope, or neurologic sequela. The patient is tolerating medications without difficulties and is otherwise without complaint today.   Past Medical History  Diagnosis Date  . Hypertension   . Diabetes mellitus   . Hyperlipidemia   . Coronary artery disease 4/12   NSTEMI, occluded RCA with collaterals, no intervention required  . Atrial flutter   . Paroxysmal atrial fibrillation   . RBBB   . GERD (gastroesophageal reflux disease)    Past Surgical History  Procedure Laterality Date  . Cardiac catheterization  08/03/2011    Current Outpatient Prescriptions  Medication Sig Dispense Refill  . amLODipine (NORVASC) 10 MG tablet Take 1 tablet (10 mg total) by mouth daily.  30 tablet  6  . aspirin 81 MG tablet Take 81 mg by mouth daily.      Marland Kitchen atorvastatin (LIPITOR) 40 MG tablet Take 1 tablet (40 mg total) by mouth daily.  30 tablet  6  . benazepril (LOTENSIN) 20 MG tablet Take 40 mg by mouth daily.      Marland Kitchen diltiazem (CARDIZEM) 30 MG tablet Take 1 tablet (30 mg total) by mouth 4 (four) times daily as needed.  90 tablet  6  . HUMULIN 70/30 (70-30) 100 UNIT/ML injection 60 units am and 50 units pm daily.      . isosorbide mononitrate (IMDUR) 30 MG 24 hr tablet Take 1 tablet (30 mg total) by mouth daily.  30 tablet  6  . metoprolol tartrate (LOPRESSOR) 25 MG tablet Take 1 tablet (25 mg total) by mouth 2 (two) times daily.  180 tablet  3  . MULTAQ 400 MG tablet Take 400 mg by mouth 2 (two) times daily with a meal.       . nitroGLYCERIN (NITROSTAT) 0.4 MG SL tablet Place 1 tablet (0.4 mg total) under the tongue every 5 (five) minutes as needed for chest pain.  25 tablet  6  . ONE TOUCH ULTRA TEST  test strip       . Rivaroxaban (XARELTO) 20 MG TABS Take 1 tablet (20 mg total) by mouth daily.  30 tablet  5  . sildenafil (VIAGRA) 100 MG tablet Take 1 tablet (100 mg total) by mouth as needed.  10 tablet  1   No current facility-administered medications for this visit.    Allergies  Allergen Reactions  . No Known Allergies     History   Social History  . Marital Status: Unknown    Spouse Name: N/A    Number of Children: N/A  . Years of Education: N/A   Occupational History  . Not on file.   Social History Main Topics  . Smoking status: Former Smoker  -- 1.50 packs/day for 35 years    Types: Cigarettes    Quit date: 01/16/2008  . Smokeless tobacco: Not on file  . Alcohol Use: Yes     Comment: occassional  . Drug Use: No  . Sexual Activity: Not on file   Other Topics Concern  . 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 Heart attack Maternal Uncle   . Heart attack Father 71    ROS- All systems are reviewed and negative except as per the HPI above  Physical Exam: Filed Vitals:   01/23/13 1113  BP: 134/74  Pulse: 52  Height: 5\' 11"  (1.803 m)  Weight: 248 lb 6.4 oz (112.674 kg)    GEN- The patient is well appearing, alert and oriented x 3 today.   Head- normocephalic, atraumatic Eyes-  Sclera clear, conjunctiva pink Ears- hearing intact Oropharynx- clear Neck- supple, no JVP Lymph- no cervical lymphadenopathy Lungs- Clear to ausculation bilaterally, normal work of breathing Heart- Regular rate and rhythm, no murmurs, rubs or gallops, PMI not laterally displaced GI- soft, NT, ND, + BS Extremities- no clubbing, cyanosis, or edema MS- no significant deformity or atrophy Skin- no rash or lesion Psych- euthymic mood, full affect Neuro- strength and sensation are intact  EKGs are reviewed which reveal both atrial flutter and also atrial fibrillation Echo reviewed Dr Ethelene Hal notes are reviewed  Assessment and Plan:  1. Atrial fibrillation and atrial flutter The patient has symptomatic atrial fibrillation and atrial flutter.  His predominant arrhythmia appears to be atrial flutter.  He has not tolerated multaq. Therapeutic strategies for afib and atrial flutter including medicine and ablation were discussed in detail with the patient today. Risk, benefits, and alternatives to EP study and radiofrequency ablation were also discussed in detail today. These  risks include but are not limited to stroke, bleeding, vascular damage, tamponade, perforation, damage to the esophagus, lungs, and other structures, pulmonary vein stenosis, AV block requiring PPM worsening renal function, and death. The patient understands these risk and wishes to proceed.  We will therefore proceed with catheter ablation at the next available time.  2. Snoring Sleep study is ordered.  3. CAD Stable No change required today

## 2013-01-24 ENCOUNTER — Ambulatory Visit: Payer: 59 | Admitting: Internal Medicine

## 2013-01-28 ENCOUNTER — Encounter (HOSPITAL_COMMUNITY): Payer: Self-pay | Admitting: *Deleted

## 2013-01-28 ENCOUNTER — Ambulatory Visit (HOSPITAL_COMMUNITY)
Admission: RE | Admit: 2013-01-28 | Discharge: 2013-01-28 | Disposition: A | Discharge status: Home / Self Care | Payer: 59 | Source: Ambulatory Visit | Attending: Cardiology | Admitting: Cardiology

## 2013-01-28 ENCOUNTER — Encounter (HOSPITAL_COMMUNITY)
Admission: RE | Disposition: A | Discharge status: Home / Self Care | Payer: Self-pay | Source: Ambulatory Visit | Attending: Cardiology

## 2013-01-28 DIAGNOSIS — I2510 Atherosclerotic heart disease of native coronary artery without angina pectoris: Secondary | ICD-10-CM | POA: Insufficient documentation

## 2013-01-28 DIAGNOSIS — R0609 Other forms of dyspnea: Secondary | ICD-10-CM | POA: Insufficient documentation

## 2013-01-28 DIAGNOSIS — I4892 Unspecified atrial flutter: Secondary | ICD-10-CM | POA: Insufficient documentation

## 2013-01-28 DIAGNOSIS — E785 Hyperlipidemia, unspecified: Secondary | ICD-10-CM | POA: Insufficient documentation

## 2013-01-28 DIAGNOSIS — R0989 Other specified symptoms and signs involving the circulatory and respiratory systems: Secondary | ICD-10-CM | POA: Insufficient documentation

## 2013-01-28 DIAGNOSIS — I4891 Unspecified atrial fibrillation: Secondary | ICD-10-CM | POA: Insufficient documentation

## 2013-01-28 DIAGNOSIS — K219 Gastro-esophageal reflux disease without esophagitis: Secondary | ICD-10-CM | POA: Insufficient documentation

## 2013-01-28 DIAGNOSIS — I4510 Unspecified right bundle-branch block: Secondary | ICD-10-CM | POA: Insufficient documentation

## 2013-01-28 DIAGNOSIS — I059 Rheumatic mitral valve disease, unspecified: Secondary | ICD-10-CM

## 2013-01-28 DIAGNOSIS — I10 Essential (primary) hypertension: Secondary | ICD-10-CM | POA: Insufficient documentation

## 2013-01-28 DIAGNOSIS — E119 Type 2 diabetes mellitus without complications: Secondary | ICD-10-CM | POA: Insufficient documentation

## 2013-01-28 HISTORY — PX: TEE WITHOUT CARDIOVERSION: SHX5443

## 2013-01-28 LAB — GLUCOSE, CAPILLARY: Glucose-Capillary: 217 mg/dL — ABNORMAL HIGH (ref 70–99)

## 2013-01-28 SURGERY — ECHOCARDIOGRAM, TRANSESOPHAGEAL
Anesthesia: Moderate Sedation

## 2013-01-28 MED ORDER — BUTAMBEN-TETRACAINE-BENZOCAINE 2-2-14 % EX AERO
INHALATION_SPRAY | CUTANEOUS | Status: DC | PRN
  Administered 2013-01-28: 2 via TOPICAL

## 2013-01-28 MED ORDER — MIDAZOLAM HCL 5 MG/ML IJ SOLN
INTRAMUSCULAR | Status: AC
  Filled 2013-01-28: qty 1

## 2013-01-28 MED ORDER — FENTANYL CITRATE 0.05 MG/ML IJ SOLN
INTRAMUSCULAR | Status: AC
  Filled 2013-01-28: qty 2

## 2013-01-28 MED ORDER — MIDAZOLAM HCL 10 MG/2ML IJ SOLN
INTRAMUSCULAR | Status: DC | PRN
  Administered 2013-01-28 (×2): 2 mg via INTRAVENOUS
  Administered 2013-01-28: 1 mg via INTRAVENOUS

## 2013-01-28 MED ORDER — SODIUM CHLORIDE 0.9 % IV SOLN
INTRAVENOUS | Status: DC
  Administered 2013-01-28: 500 mL via INTRAVENOUS

## 2013-01-28 MED ORDER — FENTANYL CITRATE 0.05 MG/ML IJ SOLN
INTRAMUSCULAR | Status: DC | PRN
  Administered 2013-01-28 (×2): 25 ug via INTRAVENOUS

## 2013-01-28 NOTE — H&P (Signed)
Douglas Hunter  01/23/2013 10:30 AM   Office Visit  MRN:  [id number]   Description: 57 year old male  Provider: Hillis Range, MD  Department: [facility] [facility]        Referring Provider    Vonita Moss, MD      Diagnoses    Atrial flutter    -  Primary    427.32    Snoring        786.09    Atrial fibrillation        427.31          Progress Notes    Hillis Range, MD at 01/23/2013 12:14 PM    Status: Signed                    Primary Care Physician: Vonita Moss, MD Referring Physician:  Dr Dennison Nancy Coone is a 57 y.o. male with a h/o CAD, atrial flutter and atrial fibrillation who presents today for EP consultation.  He had non-STEMI with admission to the hospital in late April 2012 with cardiac catheterization showing severe mid left circumflex disease, subtotally occluded, with collaterals from the RCA to the LAD circumflex, normal ejection fraction.  Medical management was required.   He presented 5/14 with SVT.  He reports that adenosine was not successful in terminating this arrhythmia.  He was placed on IV diltiazem.  In retrospect, he has had similar tachypalpitations in the past.  Per Dr Mariah Milling, he has had numerous episodes of atrial flutter. He has had 2 recent hospitalizations for atrial flutter, 08/11/2012 and recently on 01/11/2013.  Both episodes required hospitalization, medications for rate control. He has had several episodes that broke on their own, one episode 2 months ago.   His most recent admission several days ago was for tachycardia/palpitations. He woke up at 3:30 in the morning with lightheadedness, took his morning metoprolol and diltiazem that he takes when necessary.  He snores but has not had a sleep study. He has been taking anticoagulation on a consistent basis.  He has been initiated medical therapy with Multaq and metoprolol twice a day.  He reports associated GI side effects with this medicine.    Echocardiogram shows normal  LV systolic function, essentially normal study, May 2013   Today, he denies symptoms of palpitations, chest pain, shortness of breath, orthopnea, PND, lower extremity edema, dizziness, presyncope, syncope, or neurologic sequela. The patient is tolerating medications without difficulties and is otherwise without complaint today.     Past Medical History   Diagnosis  Date   .  Hypertension     .  Diabetes mellitus     .  Hyperlipidemia     .  Coronary artery disease  4/12       NSTEMI, occluded RCA with collaterals, no intervention required   .  Atrial flutter     .  Paroxysmal atrial fibrillation     .  RBBB     .  GERD (gastroesophageal reflux disease)         Past Surgical History   Procedure  Laterality  Date   .  Cardiac catheterization    08/03/2011         Current Outpatient Prescriptions   Medication  Sig  Dispense  Refill   .  amLODipine (NORVASC) 10 MG tablet  Take 1 tablet (10 mg total) by mouth daily.   30 tablet   6   .  aspirin 81 MG tablet  Take 81 mg by mouth daily.         Marland Kitchen  atorvastatin (LIPITOR) 40 MG tablet  Take 1 tablet (40 mg total) by mouth daily.   30 tablet   6   .  benazepril (LOTENSIN) 20 MG tablet  Take 40 mg by mouth daily.         Marland Kitchen  diltiazem (CARDIZEM) 30 MG tablet  Take 1 tablet (30 mg total) by mouth 4 (four) times daily as needed.   90 tablet   6   .  HUMULIN 70/30 (70-30) 100 UNIT/ML injection  60 units am and 50 units pm daily.         .  isosorbide mononitrate (IMDUR) 30 MG 24 hr tablet  Take 1 tablet (30 mg total) by mouth daily.   30 tablet   6   .  metoprolol tartrate (LOPRESSOR) 25 MG tablet  Take 1 tablet (25 mg total) by mouth 2 (two) times daily.   180 tablet   3   .  MULTAQ 400 MG tablet  Take 400 mg by mouth 2 (two) times daily with a meal.          .  nitroGLYCERIN (NITROSTAT) 0.4 MG SL tablet  Place 1 tablet (0.4 mg total) under the tongue every 5 (five) minutes as needed for chest pain.   25 tablet   6   .  ONE TOUCH ULTRA TEST test  strip           .  Rivaroxaban (XARELTO) 20 MG TABS  Take 1 tablet (20 mg total) by mouth daily.   30 tablet   5   .  sildenafil (VIAGRA) 100 MG tablet  Take 1 tablet (100 mg total) by mouth as needed.   10 tablet   1       No current facility-administered medications for this visit.         Allergies   Allergen  Reactions   .  No Known Allergies           History       Social History   .  Marital Status:  Unknown       Spouse Name:  N/A       Number of Children:  N/A   .  Years of Education:  N/A       Occupational History   .  Not on file.       Social History Main Topics   .  Smoking status:  Former Smoker -- 1.50 packs/day for 35 years       Types:  Cigarettes       Quit date:  01/16/2008   .  Smokeless tobacco:  Not on file   .  Alcohol Use:  Yes         Comment: occassional   .  Drug Use:  No   .  Sexual Activity:  Not on file       Other Topics  Concern   .  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  Heart attack  Maternal Uncle     .  Heart attack  Father  24  ROS- All systems are reviewed and negative except as per the HPI above   Physical Exam: Filed Vitals:     01/23/13 1113   BP:  134/74   Pulse:  52   Height:  5\' 11"  (1.803 m)   Weight:  248 lb 6.4 oz (112.674 kg)        GEN- The patient is well appearing, alert and oriented x 3 today.    Head- normocephalic, atraumatic Eyes-  Sclera clear, conjunctiva pink Ears- hearing intact Oropharynx- clear Neck- supple, no JVP Lymph- no cervical lymphadenopathy Lungs- Clear to ausculation bilaterally, normal work of breathing Heart- Regular rate and rhythm, no murmurs, rubs or gallops, PMI not laterally displaced GI- soft, NT, ND, + BS Extremities- no clubbing, cyanosis, or edema MS- no  significant deformity or atrophy Skin- no rash or lesion Psych- euthymic mood, full affect Neuro- strength and sensation are intact   EKGs are reviewed which reveal both atrial flutter and also atrial fibrillation Echo reviewed Dr Ethelene Hal notes are reviewed   Assessment and Plan:   1. Atrial fibrillation and atrial flutter The patient has symptomatic atrial fibrillation and atrial flutter.  His predominant arrhythmia appears to be atrial flutter.  He has not tolerated multaq. Therapeutic strategies for afib and atrial flutter including medicine and ablation were discussed in detail with the patient today. Risk, benefits, and alternatives to EP study and radiofrequency ablation were also discussed in detail today. These risks include but are not limited to stroke, bleeding, vascular damage, tamponade, perforation, damage to the esophagus, lungs, and other structures, pulmonary vein stenosis, AV block requiring PPM worsening renal function, and death. The patient understands these risk and wishes to proceed.  We will therefore proceed with catheter ablation at the next available time.   2. Snoring Sleep study is ordered.   3. CAD Stable No change required today    For TEE prior to ablation; no changes. Olga Millers

## 2013-01-28 NOTE — Interval H&P Note (Signed)
History and Physical Interval Note:  01/28/2013 9:19 AM  Douglas Hunter  has presented today for surgery, with the diagnosis of AFIB  The various methods of treatment have been discussed with the patient and family. After consideration of risks, benefits and other options for treatment, the patient has consented to  Procedure(s): TRANSESOPHAGEAL ECHOCARDIOGRAM (TEE) (N/A) as a surgical intervention .  The patient's history has been reviewed, patient examined, no change in status, stable for surgery.  I have reviewed the patient's chart and labs.  Questions were answered to the patient's satisfaction.     Olga Millers

## 2013-01-28 NOTE — Progress Notes (Signed)
  Echocardiogram Echocardiogram Transesophageal has been performed.  Perl Folmar 01/28/2013, 9:45 AM

## 2013-01-28 NOTE — CV Procedure (Signed)
See full TEE report in [facility]; normal LV function, mild LAE; no LAA thrombus; mild [name]

## 2013-01-29 ENCOUNTER — Encounter (HOSPITAL_COMMUNITY): Payer: Self-pay | Admitting: Cardiology

## 2013-01-29 ENCOUNTER — Encounter (HOSPITAL_COMMUNITY): Payer: 59 | Admitting: Anesthesiology

## 2013-01-29 ENCOUNTER — Encounter (HOSPITAL_COMMUNITY)
Admission: RE | Disposition: A | Discharge status: Home / Self Care | Payer: Self-pay | Source: Ambulatory Visit | Attending: Internal Medicine

## 2013-01-29 ENCOUNTER — Ambulatory Visit (HOSPITAL_COMMUNITY): Admission: RE | Admit: 2013-01-29 | Payer: 59 | Source: Ambulatory Visit | Admitting: Internal Medicine

## 2013-01-29 ENCOUNTER — Encounter (HOSPITAL_COMMUNITY)
Admission: RE | Disposition: A | Discharge status: Home / Self Care | Payer: Self-pay | Source: Ambulatory Visit | Attending: Cardiology

## 2013-01-29 ENCOUNTER — Ambulatory Visit (HOSPITAL_COMMUNITY): Payer: 59 | Admitting: Anesthesiology

## 2013-01-29 ENCOUNTER — Ambulatory Visit (HOSPITAL_COMMUNITY)
Admission: RE | Admit: 2013-01-29 | Discharge: 2013-01-30 | Disposition: A | Discharge status: Home / Self Care | Payer: 59 | Source: Ambulatory Visit | Attending: Internal Medicine | Admitting: Internal Medicine

## 2013-01-29 ENCOUNTER — Ambulatory Visit (HOSPITAL_COMMUNITY): Admit: 2013-01-29 | Payer: Self-pay | Admitting: Internal Medicine

## 2013-01-29 DIAGNOSIS — I4892 Unspecified atrial flutter: Secondary | ICD-10-CM

## 2013-01-29 DIAGNOSIS — E119 Type 2 diabetes mellitus without complications: Secondary | ICD-10-CM | POA: Insufficient documentation

## 2013-01-29 DIAGNOSIS — I4891 Unspecified atrial fibrillation: Secondary | ICD-10-CM

## 2013-01-29 DIAGNOSIS — I252 Old myocardial infarction: Secondary | ICD-10-CM | POA: Insufficient documentation

## 2013-01-29 DIAGNOSIS — I10 Essential (primary) hypertension: Secondary | ICD-10-CM | POA: Insufficient documentation

## 2013-01-29 DIAGNOSIS — E785 Hyperlipidemia, unspecified: Secondary | ICD-10-CM | POA: Insufficient documentation

## 2013-01-29 DIAGNOSIS — K219 Gastro-esophageal reflux disease without esophagitis: Secondary | ICD-10-CM | POA: Insufficient documentation

## 2013-01-29 DIAGNOSIS — I2510 Atherosclerotic heart disease of native coronary artery without angina pectoris: Secondary | ICD-10-CM | POA: Insufficient documentation

## 2013-01-29 DIAGNOSIS — Z7901 Long term (current) use of anticoagulants: Secondary | ICD-10-CM | POA: Insufficient documentation

## 2013-01-29 DIAGNOSIS — I4510 Unspecified right bundle-branch block: Secondary | ICD-10-CM | POA: Insufficient documentation

## 2013-01-29 DIAGNOSIS — Z79899 Other long term (current) drug therapy: Secondary | ICD-10-CM | POA: Insufficient documentation

## 2013-01-29 DIAGNOSIS — Z794 Long term (current) use of insulin: Secondary | ICD-10-CM | POA: Insufficient documentation

## 2013-01-29 HISTORY — PX: ATRIAL FIBRILLATION ABLATION: SHX5456

## 2013-01-29 HISTORY — PX: ABLATION: SHX5711

## 2013-01-29 LAB — GLUCOSE, CAPILLARY
Glucose-Capillary: 130 mg/dL — ABNORMAL HIGH (ref 70–99)
Glucose-Capillary: 229 mg/dL — ABNORMAL HIGH (ref 70–99)
Glucose-Capillary: 316 mg/dL — ABNORMAL HIGH (ref 70–99)

## 2013-01-29 LAB — POCT ACTIVATED CLOTTING TIME
Activated Clotting Time: 247 seconds
Activated Clotting Time: 268 seconds
Activated Clotting Time: 155 seconds

## 2013-01-29 SURGERY — ATRIAL FIBRILLATION ABLATION
Anesthesia: General

## 2013-01-29 SURGERY — ATRIAL FIBRILLATION ABLATION
Anesthesia: Monitor Anesthesia Care

## 2013-01-29 MED ORDER — INSULIN ASPART PROT & ASPART (70-30 MIX) 100 UNIT/ML ~~LOC~~ SUSP
50.0000 [IU] | Freq: Every day | SUBCUTANEOUS | Status: DC
  Administered 2013-01-29: 50 [IU] via SUBCUTANEOUS
  Filled 2013-01-29: qty 10

## 2013-01-29 MED ORDER — SODIUM CHLORIDE 0.9 % IV SOLN
INTRAVENOUS | Status: DC | PRN
  Administered 2013-01-29: 07:00:00 via INTRAVENOUS

## 2013-01-29 MED ORDER — ALBUMIN HUMAN 5 % IV SOLN
INTRAVENOUS | Status: DC | PRN
  Administered 2013-01-29: 08:00:00 via INTRAVENOUS

## 2013-01-29 MED ORDER — ISOSORBIDE MONONITRATE ER 30 MG PO TB24
30.0000 mg | ORAL_TABLET | Freq: Every day | ORAL | Status: DC
  Administered 2013-01-29 – 2013-01-30 (×2): 30 mg via ORAL
  Filled 2013-01-29 (×2): qty 1

## 2013-01-29 MED ORDER — PROPOFOL 10 MG/ML IV BOLUS
INTRAVENOUS | Status: DC | PRN
  Administered 2013-01-29: 120 mg via INTRAVENOUS
  Administered 2013-01-29 (×3): 10 mg via INTRAVENOUS

## 2013-01-29 MED ORDER — PROTAMINE SULFATE 10 MG/ML IV SOLN
INTRAVENOUS | Status: DC | PRN
  Administered 2013-01-29: 10 mg via INTRAVENOUS
  Administered 2013-01-29: 20 mg via INTRAVENOUS

## 2013-01-29 MED ORDER — ACETAMINOPHEN 325 MG PO TABS: 650.0000 mg | ORAL_TABLET | ORAL | Status: DC | PRN

## 2013-01-29 MED ORDER — INSULIN ASPART 100 UNIT/ML ~~LOC~~ SOLN
0.0000 [IU] | Freq: Every day | SUBCUTANEOUS | Status: DC
  Administered 2013-01-29: 5 [IU] via SUBCUTANEOUS

## 2013-01-29 MED ORDER — BENAZEPRIL HCL 40 MG PO TABS
40.0000 mg | ORAL_TABLET | Freq: Every day | ORAL | Status: DC
  Administered 2013-01-29 – 2013-01-30 (×2): 40 mg via ORAL
  Filled 2013-01-29 (×2): qty 1

## 2013-01-29 MED ORDER — FENTANYL CITRATE 0.05 MG/ML IJ SOLN
INTRAMUSCULAR | Status: DC | PRN
  Administered 2013-01-29 (×2): 50 ug via INTRAVENOUS
  Administered 2013-01-29 (×2): 25 ug via INTRAVENOUS

## 2013-01-29 MED ORDER — SODIUM CHLORIDE 0.9 % IJ SOLN
3.0000 mL | Freq: Two times a day (BID) | INTRAMUSCULAR | Status: DC
  Administered 2013-01-29: 16:00:00 via INTRAVENOUS
  Administered 2013-01-29 – 2013-01-30 (×2): 3 mL via INTRAVENOUS

## 2013-01-29 MED ORDER — INSULIN ASPART PROT & ASPART (70-30 MIX) 100 UNIT/ML ~~LOC~~ SUSP
60.0000 [IU] | Freq: Every day | SUBCUTANEOUS | Status: DC
  Administered 2013-01-30: 60 [IU] via SUBCUTANEOUS

## 2013-01-29 MED ORDER — HEPARIN SODIUM (PORCINE) 1000 UNIT/ML IJ SOLN
INTRAMUSCULAR | Status: AC
  Filled 2013-01-29: qty 1

## 2013-01-29 MED ORDER — HEPARIN SODIUM (PORCINE) 1000 UNIT/ML IJ SOLN
INTRAMUSCULAR | Status: DC | PRN
  Administered 2013-01-29: 4000 [IU] via INTRAVENOUS

## 2013-01-29 MED ORDER — LIDOCAINE HCL (CARDIAC) 20 MG/ML IV SOLN
INTRAVENOUS | Status: DC | PRN
  Administered 2013-01-29: 50 mg via INTRAVENOUS

## 2013-01-29 MED ORDER — HYDROMORPHONE HCL PF 1 MG/ML IJ SOLN: 0.2500 mg | INTRAMUSCULAR | Status: DC | PRN

## 2013-01-29 MED ORDER — MIDAZOLAM HCL 5 MG/5ML IJ SOLN
INTRAMUSCULAR | Status: DC | PRN
  Administered 2013-01-29: 2 mg via INTRAVENOUS

## 2013-01-29 MED ORDER — NITROGLYCERIN 0.4 MG SL SUBL: 0.4000 mg | SUBLINGUAL_TABLET | SUBLINGUAL | Status: DC | PRN

## 2013-01-29 MED ORDER — SODIUM CHLORIDE 0.9 % IJ SOLN: 3.0000 mL | INTRAMUSCULAR | Status: DC | PRN

## 2013-01-29 MED ORDER — INSULIN ASPART 100 UNIT/ML ~~LOC~~ SOLN
0.0000 [IU] | Freq: Three times a day (TID) | SUBCUTANEOUS | Status: DC
  Administered 2013-01-30: 5 [IU] via SUBCUTANEOUS

## 2013-01-29 MED ORDER — FUROSEMIDE 10 MG/ML IJ SOLN
INTRAMUSCULAR | Status: AC
  Filled 2013-01-29: qty 4

## 2013-01-29 MED ORDER — RIVAROXABAN 20 MG PO TABS
20.0000 mg | ORAL_TABLET | Freq: Every day | ORAL | Status: DC
  Administered 2013-01-29: 20 mg via ORAL
  Filled 2013-01-29 (×2): qty 1

## 2013-01-29 MED ORDER — SODIUM CHLORIDE 0.9 % IV SOLN: 250.0000 mL | INTRAVENOUS | Status: DC | PRN

## 2013-01-29 MED ORDER — FUROSEMIDE 10 MG/ML IJ SOLN
40.0000 mg | Freq: Once | INTRAMUSCULAR | Status: AC
  Filled 2013-01-29: qty 4

## 2013-01-29 MED ORDER — AMLODIPINE BESYLATE 5 MG PO TABS
5.0000 mg | ORAL_TABLET | Freq: Every day | ORAL | Status: DC
  Administered 2013-01-29 – 2013-01-30 (×2): 5 mg via ORAL
  Filled 2013-01-29 (×2): qty 1

## 2013-01-29 MED ORDER — HYDROCODONE-ACETAMINOPHEN 5-325 MG PO TABS
1.0000 | ORAL_TABLET | ORAL | Status: DC | PRN
  Administered 2013-01-30: 1 via ORAL
  Administered 2013-01-30: 2 via ORAL
  Filled 2013-01-29: qty 2
  Filled 2013-01-29: qty 1

## 2013-01-29 MED ORDER — ONDANSETRON HCL 4 MG/2ML IJ SOLN: 4.0000 mg | Freq: Four times a day (QID) | INTRAMUSCULAR | Status: DC | PRN

## 2013-01-29 NOTE — Anesthesia Postprocedure Evaluation (Signed)
  Anesthesia Post-op Note  Patient: Douglas Hunter  Procedure(s) Performed: Procedure(s): ATRIAL FIBRILLATION ABLATION (N/A)  Patient Location: Cath Lab  Anesthesia Type:General  Level of Consciousness: awake and alert   Airway and Oxygen Therapy: Patient Spontanous Breathing  Post-op Pain: none  Post-op Assessment: Post-op Vital signs reviewed, Patient's Cardiovascular Status Stable and Respiratory Function Stable  Post-op Vital Signs: Reviewed and stable  Complications: No apparent anesthesia complications

## 2013-01-29 NOTE — Interval H&P Note (Signed)
History and Physical Interval Note:  01/29/2013 7:32 AM  Douglas Hunter  has presented today for surgery, with the diagnosis of afib  The various methods of treatment have been discussed with the patient and family. After consideration of risks, benefits and other options for treatment, the patient has consented to  Procedure(s): ATRIAL FIBRILLATION ABLATION (N/A) as a surgical intervention .  The patient's history has been reviewed, patient examined, no change in status, stable for surgery.  I have reviewed the patient's chart and labs.  Questions were answered to the patient's satisfaction.     Hillis Range

## 2013-01-29 NOTE — Op Note (Signed)
SURGEON:  Hillis Range, MD  PREPROCEDURE DIAGNOSES: 1. Paroxysmal atrial fibrillation. 2. Typical appearing atrial flutter  POSTPROCEDURE DIAGNOSES: 1. Paroxysmal  atrial fibrillation. 2. Isthmus dependant right atrial flutter 3. Transient left atrial flutter not mapped or ablated today  PROCEDURES: 1. Comprehensive electrophysiologic study. 2. Coronary sinus pacing and recording. 3. Three-dimensional mapping of atrial fibrillation with additional mapping and ablation of a second discrete focus (atrial flutter) 4. Ablation of atrial fibrillation with additional mapping and ablation of a second discrete focus (atrial flutter) 5. Intracardiac echocardiography. 6. Transseptal puncture of an intact septum. 7. Arrhythmia induction with pacing   INTRODUCTION:  Douglas Hunter is a 57 y.o. male with a history of paroxysmal atrial fibrillation and typical appearing atrial flutter who now presents for EP study and radiofrequency ablation.  The patient reports initially being diagnosed with atrial fibrillation after presenting with symptomatic palpitations and fatgiue. The patient reports increasing frequency and duration of atrial arrhythmias since that time.  The patient has failed medical therapy with multaq.  The patient therefore presents today for catheter ablation of atrial fibrillation and atrial flutter.  DESCRIPTION OF PROCEDURE:  Informed written consent was obtained, and the patient was brought to the electrophysiology lab in a fasting state.  The patient was adequately sedated with intravenous medications as outlined in the anesthesia report.  The patient's left and right groins were prepped and draped in the usual sterile fashion by the EP lab staff.  Using a percutaneous Seldinger technique, two 7-French and one 11-French hemostasis sheaths were placed into the right common femoral vein.    3 Dimensional Rotational Angiography: A 5 french pigtail catheter was introduced through the right  common femoral vein and advanced into the inferior venocava.  3 demential rotational angiography was then performed by power injection of 100cc of nonionic contrast.  Due to technical difficulty with image acquisition, reprocessing of the image could not be performed today. The pigtail catheter was then removed.  Catheter Placement:  A 7-French [employer] Decapolar coronary sinus catheter was introduced through the right common femoral vein and advanced into the coronary sinus for recording and pacing from this location.  A quadrapolar catheter was introduced through the right common femoral vein and advanced into the right ventricle for recording and pacing.  This catheter was then pulled back to the His bundle location.    Initial Measurements: The patient presented to the electrophysiology lab in sinus rhythm.  The patients PR interval measured 1141 msec with a QRS duration of 136 msec (RBBB) and a QT interval of 451 msec.  The AH interval measured 72 msec and the HV interval measured 41 msec.     Intracardiac Echocardiography: A 10-French [employer] AcuNav intracardiac echocardiography catheter was introduced through the left common femoral vein and advanced into the right atrium. Intracardiac echocardiography was performed of the left atrium, and a three-dimensional anatomical rendering of the left atrium was performed using CARTO sound technology.  The patient was noted to have a moderate sized left atrium.  The interatrial septum was prominent but not aneurysmal. All 4 pulmonary veins were visualized and noted to have separate ostia.  The pulmonary veins were moderate in size.  The left atrial appendage was visualized and did not reveal thrombus.   There was no evidence of pulmonary vein stenosis.   Transseptal Puncture: The middle right common femoral vein sheath was exchanged for an 8.5 [place] SL2 transseptal sheath and transseptal access was achieved in a standard fashion using a  Brockenbrough needle under biplane fluoroscopy with intracardiac echocardiography confirmation of the transseptal puncture.  Once transseptal access had been achieved, heparin was administered intravenously and intra- arterially in order to maintain an ACT of greater than 300 seconds throughout the procedure.   3D Mapping and Ablation: The His bundle catheter was removed and in its place a 3.5 mm [employer] ablation catheter was advanced into the right atrium.  The transseptal sheath was pulled back into the IVC over a guidewire.  The ablation catheter was advanced across the transseptal hole using the wire as a guide.  The transseptal sheath was then re-advanced over the guidewire into the left atrium.  A duodecapolar Bios[employer] Webster circular mapping catheter was introduced through the transseptal sheath and positioned over the mouth of all 4 pulmonary veins.  Three-dimensional electroanatomical mapping was performed using CARTO technology.  This demonstrated electrical activity within all four pulmonary veins at baseline. The patient underwent successful sequential electrical isolation and anatomical encircling of all four pulmonary veins using radiofrequency current with a circular mapping catheter as a guide.    Rapid atrial pacing was performed which demonstrated sustained atrial flutter with a CL of 290 msec.  The surface electrocardiogram was consistent with typical atrial flutter.  The CS activation sequence was proximal to distal and suggestive of right atrial flutter.  Entrainment from the left atrium was performed which revealed a long PPI when compaired to the TCL.  Entrainment mapping from the isthmus was performed which revealed PPI=TCL confirming isthmus dependant right atrial flutter. The ablation catheter was then pulled back into the right atrial and positioned along the cavo-tricuspid isthmus.  Mapping along the atrial side of the isthmus was performed.  This  demonstrated a standard isthmus.  A series of extensive radiofrequency applications were then delivered along the isthmus.  Unfortunately I could not achieve isthmus block despite extensive ablation including through the SL2 sheath.  With rapid atrial pacing, atrial flutter could still be induced.  Interestingly, the atrial flutter converted to a left atrial flutter with clear distal to proximal CS activation.  This atrial flutter spontaneously terminated and could not be re-induced for additional mapping or ablation.   I therefore elected to use a better suited catheter for isthmus ablation. A Biosense webster 7.94F 8mm Celsius Flutter catheter was introduced through the 11 F femoral venous sheath and advanced into the right atrium.  Additional RF was delivered at 60 watts with a target temperature of 60 degrees.  Complete bidirectional cavotricuspid isthmus block was achieved as confirmed by differential atrial pacing from the low lateral right atrium.  A stimulus to earliest atrial activation across the isthmus measured 150 msec bi-directionally.  The patient was observe without return of conduction through the isthmus.  Measurements Following Ablation: In sinus rhythm with RR interval was 1359 msec, with PR 138 msec, QRS 146 msec, and Qt 495 msec.  Following ablation the AH interval measured 85 msec with an HV interval of 49 msec. Electroisolation was then again confirmed in all four pulmonary veins.  Frequent junctional rhythm was observed.  Ventricular pacing was performed, which revealed concentric decremental VA conduction with a VA WCL of 470 msec.  VEST was performed with a BCL of 500 msec which revealed decremental midline conduction with no retrograde echo beats, tachycardias, or jumps observed.  The retrograde AV nodal ERP was 500/330msec.   Rapid atrial pacing was performed, which revealed an AV Wenckebach cycle length of 360 msec with PR=RR but not >  RR.  AEST was performed which revealed  decremental AV conduction with no AH jumps, echo beats, or tachycardia induced.  The AV nodal ERP was 500/314msec.  No arrhythmias were induced with pacing down to a CL of 250 msec post ablation. The procedure was therefore considered completed.  All catheters were removed, and the sheaths were aspirated and flushed.  The patient was transferred to the recovery area for sheath removal per protocol.  A limited bedside transthoracic echocardiogram revealed no pericardial effusion.  There were no early apparent complications.  CONCLUSIONS: 1. Sinus rhythm upon presentation.   2.  No evidence of Accessory pathways or dual AV nodal physiology 3. Successful electrical isolation and anatomical encircling of all four pulmonary veins with radiofrequency current.    4. Cavo-tricuspid isthmus ablation was performed with complete bidirectional isthmus block achieved.  5. No inducible arrhythmias following ablation both on and off of Isuprel 6. No early apparent complications.   Annastasia Haskins,MD 11:28 AM 01/29/2013

## 2013-01-29 NOTE — Progress Notes (Signed)
Pts. Blood sugar was >400 pt. Appears comfortable, asymptomatic Dr. Adolm Joseph was notified and with orders made. Will give novolog bedtime coverage per MD and cont. to monitor blood sugar.

## 2013-01-29 NOTE — Anesthesia Preprocedure Evaluation (Addendum)
Anesthesia Evaluation  Patient identified by MRN, date of birth, ID band Patient awake    Reviewed: Allergy & Precautions, H&P , NPO status , Patient's Chart, lab work & pertinent test results, reviewed documented beta blocker date and time   Airway Mallampati: II TM Distance: >3 FB Neck ROM: Full    Dental no notable dental hx. (+) Dental Advisory Given and Teeth Intact   Pulmonary sleep apnea ,  breath sounds clear to auscultation  Pulmonary exam normal       Cardiovascular hypertension, On Medications, On Home Beta Blockers and Pt. on medications + CAD + dysrhythmias Atrial Fibrillation Rhythm:Regular Rate:Normal     Neuro/Psych negative neurological ROS  negative psych ROS   GI/Hepatic Neg liver ROS, GERD-  Medicated and Controlled,  Endo/Other  diabetes, Well Controlled, Type 1, Insulin Dependent  Renal/GU negative Renal ROS  negative genitourinary   Musculoskeletal   Abdominal (+)  Abdomen: soft. Bowel sounds: normal.  Peds  Hematology negative hematology ROS (+)   Anesthesia Other Findings   Reproductive/Obstetrics negative OB ROS                        Anesthesia Physical Anesthesia Plan  ASA: III  Anesthesia Plan: General   Post-op Pain Management:    Induction: Intravenous  Airway Management Planned: LMA  Additional Equipment:   Intra-op Plan:   Post-operative Plan: Extubation in OR  Informed Consent: I have reviewed the patients History and Physical, chart, labs and discussed the procedure including the risks, benefits and alternatives for the proposed anesthesia with the patient or authorized representative who has indicated his/her understanding and acceptance.   Dental advisory given  Plan Discussed with: CRNA  Anesthesia Plan Comments:         Anesthesia Quick Evaluation

## 2013-01-29 NOTE — Preoperative (Signed)
Beta Blockers   Reason not to administer Beta Blockers:Not Applicable 

## 2013-01-29 NOTE — Transfer of Care (Signed)
Immediate Anesthesia Transfer of Care Note  Patient: Douglas Hunter  Procedure(s) Performed: Procedure(s): ATRIAL FIBRILLATION ABLATION (N/A)  Patient Location: PACU  Anesthesia Type:General  Level of Consciousness: awake, alert  and oriented  Airway & Oxygen Therapy: Patient Spontanous Breathing  Post-op Assessment: Report given to PACU RN  Post vital signs: stable  Complications: No apparent anesthesia complications

## 2013-01-29 NOTE — H&P (View-Only) (Signed)
Primary Care Physician: CRISSMAN,MARK, MD Referring Physician:  Dr Gollan   Douglas Hunter is a 57 y.o. male with a h/o CAD, atrial flutter and atrial fibrillation who presents today for EP consultation.  He had non-STEMI with admission to the hospital in late April 2012 with cardiac catheterization showing severe mid left circumflex disease, subtotally occluded, with collaterals from the RCA to the LAD circumflex, normal ejection fraction.  Medical management was required.  He presented 5/14 with SVT.  He reports that adenosine was not successful in terminating this arrhythmia.  He was placed on IV diltiazem.  In retrospect, he has had similar tachypalpitations in the past.  Per Dr Gollan, he has had numerous episodes of atrial flutter. He has had 2 recent hospitalizations for atrial flutter, 08/11/2012 and recently on 01/11/2013.  Both episodes required hospitalization, medications for rate control. He has had several episodes that broke on their own, one episode 2 months ago.  His most recent admission several days ago was for tachycardia/palpitations. He woke up at 3:30 in the morning with lightheadedness, took his morning metoprolol and diltiazem that he takes when necessary.  He snores but has not had a sleep study. He has been taking anticoagulation on a consistent basis.  He has been initiated medical therapy with Multaq and metoprolol twice a day.  He reports associated GI side effects with this medicine.   Echocardiogram shows normal LV systolic function, essentially normal study, May 2013  Today, he denies symptoms of palpitations, chest pain, shortness of breath, orthopnea, PND, lower extremity edema, dizziness, presyncope, syncope, or neurologic sequela. The patient is tolerating medications without difficulties and is otherwise without complaint today.   Past Medical History  Diagnosis Date  . Hypertension   . Diabetes mellitus   . Hyperlipidemia   . Coronary artery disease 4/12   NSTEMI, occluded RCA with collaterals, no intervention required  . Atrial flutter   . Paroxysmal atrial fibrillation   . RBBB   . GERD (gastroesophageal reflux disease)    Past Surgical History  Procedure Laterality Date  . Cardiac catheterization  08/03/2011    Current Outpatient Prescriptions  Medication Sig Dispense Refill  . amLODipine (NORVASC) 10 MG tablet Take 1 tablet (10 mg total) by mouth daily.  30 tablet  6  . aspirin 81 MG tablet Take 81 mg by mouth daily.      . atorvastatin (LIPITOR) 40 MG tablet Take 1 tablet (40 mg total) by mouth daily.  30 tablet  6  . benazepril (LOTENSIN) 20 MG tablet Take 40 mg by mouth daily.      . diltiazem (CARDIZEM) 30 MG tablet Take 1 tablet (30 mg total) by mouth 4 (four) times daily as needed.  90 tablet  6  . HUMULIN 70/30 (70-30) 100 UNIT/ML injection 60 units am and 50 units pm daily.      . isosorbide mononitrate (IMDUR) 30 MG 24 hr tablet Take 1 tablet (30 mg total) by mouth daily.  30 tablet  6  . metoprolol tartrate (LOPRESSOR) 25 MG tablet Take 1 tablet (25 mg total) by mouth 2 (two) times daily.  180 tablet  3  . MULTAQ 400 MG tablet Take 400 mg by mouth 2 (two) times daily with a meal.       . nitroGLYCERIN (NITROSTAT) 0.4 MG SL tablet Place 1 tablet (0.4 mg total) under the tongue every 5 (five) minutes as needed for chest pain.  25 tablet  6  . ONE TOUCH ULTRA TEST   test strip       . Rivaroxaban (XARELTO) 20 MG TABS Take 1 tablet (20 mg total) by mouth daily.  30 tablet  5  . sildenafil (VIAGRA) 100 MG tablet Take 1 tablet (100 mg total) by mouth as needed.  10 tablet  1   No current facility-administered medications for this visit.    Allergies  Allergen Reactions  . No Known Allergies     History   Social History  . Marital Status: Unknown    Spouse Name: N/A    Number of Children: N/A  . Years of Education: N/A   Occupational History  . Not on file.   Social History Main Topics  . Smoking status: Former Smoker  -- 1.50 packs/day for 35 years    Types: Cigarettes    Quit date: 01/16/2008  . Smokeless tobacco: Not on file  . Alcohol Use: Yes     Comment: occassional  . Drug Use: No  . Sexual Activity: Not on file   Other Topics Concern  . 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 Heart attack Maternal Uncle   . Heart attack Father 81    ROS- All systems are reviewed and negative except as per the HPI above  Physical Exam: Filed Vitals:   01/23/13 1113  BP: 134/74  Pulse: 52  Height: 5' 11" (1.803 m)  Weight: 248 lb 6.4 oz (112.674 kg)    GEN- The patient is well appearing, alert and oriented x 3 today.   Head- normocephalic, atraumatic Eyes-  Sclera clear, conjunctiva pink Ears- hearing intact Oropharynx- clear Neck- supple, no JVP Lymph- no cervical lymphadenopathy Lungs- Clear to ausculation bilaterally, normal work of breathing Heart- Regular rate and rhythm, no murmurs, rubs or gallops, PMI not laterally displaced GI- soft, NT, ND, + BS Extremities- no clubbing, cyanosis, or edema MS- no significant deformity or atrophy Skin- no rash or lesion Psych- euthymic mood, full affect Neuro- strength and sensation are intact  EKGs are reviewed which reveal both atrial flutter and also atrial fibrillation Echo reviewed Dr Gollans notes are reviewed  Assessment and Plan:  1. Atrial fibrillation and atrial flutter The patient has symptomatic atrial fibrillation and atrial flutter.  His predominant arrhythmia appears to be atrial flutter.  He has not tolerated multaq. Therapeutic strategies for afib and atrial flutter including medicine and ablation were discussed in detail with the patient today. Risk, benefits, and alternatives to EP study and radiofrequency ablation were also discussed in detail today. These  risks include but are not limited to stroke, bleeding, vascular damage, tamponade, perforation, damage to the esophagus, lungs, and other structures, pulmonary vein stenosis, AV block requiring PPM worsening renal function, and death. The patient understands these risk and wishes to proceed.  We will therefore proceed with catheter ablation at the next available time.  2. Snoring Sleep study is ordered.  3. CAD Stable No change required today  

## 2013-01-29 NOTE — Anesthesia Procedure Notes (Signed)
Procedure Name: LMA Insertion Date/Time: 01/29/2013 7:54 AM Performed by: Ellin Goodie Pre-anesthesia Checklist: Patient identified, Emergency Drugs available, Suction available, Patient being monitored and Timeout performed Patient Re-evaluated:Patient Re-evaluated prior to inductionOxygen Delivery Method: Circle system utilized Preoxygenation: Pre-oxygenation with 100% oxygen Intubation Type: IV induction Ventilation: Mask ventilation without difficulty LMA: LMA inserted LMA Size: 5.0 Number of attempts: 1 Placement Confirmation: positive ETCO2 and breath sounds checked- equal and bilateral Tube secured with: Tape Dental Injury: Teeth and Oropharynx as per pre-operative assessment  Comments: Easy atraumatic induction and LMA insertion.  Dr. Sampson Goon verified placement of LMA.  Carlynn Herald, CRNA

## 2013-01-29 NOTE — Discharge Summary (Signed)
ELECTROPHYSIOLOGY PROCEDURE DISCHARGE SUMMARY    Patient ID: Douglas Hunter,  MRN: [id number], DOB/AGE: Aug 05, 1955 57 y.o.  Admit date: 01/29/2013 Discharge date: 01/30/2013  Primary Care Physician: Vonita Moss, MD Primary Cardiologist: Dossie Arbour, MD Electrophysiologist: Hillis Range, MD  Primary Discharge Diagnosis:  Atrial fibrillation and atrial flutter status post electrophysiology study and radiofrequency catheter ablation this admission.   Secondary Discharge Diagnosis:  1.  CAD - s/p NSTEMI in 07/2010 with cardiac catheterization showing severe mid left circumflex disease, subtotally occluded, with collaterals from the RCA to the LAD circumflex, normal ejection fraction. Medical management was recommended. 2.  Hypertension 3.  Diabetes 4.  Hyperlipidemia 5.  RBBB 6.  GERD   Procedures This Admission:  1.  Electrophysiology study and radiofrequency catheter ablation of atrial fibrillation and atrial flutter on 01-29-2013 by Dr Johney Frame. This study demonstrated sinus rhythm upon presentation; no evidence of Accessory pathways or dual AV nodal physiology; successful electrical isolation and anatomical encircling of all four pulmonary veins with radiofrequency current; cavo-tricuspid isthmus ablation was performed with complete bidirectional isthmus block achieved. There were no inducible arrhythmias following ablation both on and off of Isuprel and no early apparent complications.   Brief HPI: Douglas Hunter is a 57 y.o. male with a history of paroxysmal atrial fibrillation and typical appearing atrial flutter who now presents for EP study and radiofrequency ablation. The patient reports initially being diagnosed with atrial fibrillation after presenting with symptomatic palpitations and fatgiue. The patient reports increasing frequency and duration of atrial arrhythmias since that time. The patient has failed medical therapy with multaq. The patient therefore presents today  for catheter ablation of atrial fibrillation and atrial flutter.  Hospital Course:  The patient was admitted and underwent EPS/RFCA with details as outlined above.  TEE the day prior to the procedure demonstrated normal LV function, mild LAE, no LAA thrombus and mild MR.  He was monitored overnight on telemetry which demonstrated sinus rhythm with short nonsustained afib with RVR.  His groin was without complication.  His medications were continued.  He was evaluated by Dr Johney Frame and considered stable for discharge to home.    Physical Exam: Filed Vitals:   01/30/13 0550 01/30/13 0600 01/30/13 0800 01/30/13 0805  BP: 125/58  125/69 147/61  Pulse:    91  Temp:    98 F (36.7 C)  TempSrc:    Oral  Resp: 19  20 20   Height:      Weight:  246 lb 7.6 oz (111.8 kg)    SpO2: 96%   97%    GEN- The patient is well appearing, alert and oriented x 3 today.   Head- normocephalic, atraumatic Eyes-  Sclera clear, conjunctiva pink Ears- hearing intact Oropharynx- clear Neck- supple, no JVP Lymph- no cervical lymphadenopathy Lungs- Clear to ausculation bilaterally, normal work of breathing Heart- Regular rate and rhythm, no murmurs, rubs or gallops, PMI not laterally displaced GI- soft, NT, ND, + BS Extremities- no clubbing, cyanosis, or edema, no hematoma/ bruit MS- no significant deformity or atrophy Skin- no rash or lesion Psych- euthymic mood, full affect Neuro- strength and sensation are intact   Labs:   Lab Results  Component Value Date   WBC 10.9* 01/23/2013   HGB 14.3 01/23/2013   HCT 41.9 01/23/2013   MCV 93.1 01/23/2013   PLT 267.0 01/23/2013     Recent Labs Lab 01/30/13 0441  NA 134*  K 4.0  CL 101  CO2 24  BUN 22  CREATININE 1.22  CALCIUM 8.7  GLUCOSE 217*    Discharge Medications:    Medication List    ASK your doctor about these medications       amLODipine 10 MG tablet  Commonly known as:  NORVASC  Take 5 mg by mouth daily.     aspirin EC 81 MG  tablet  Take 81 mg by mouth daily.     atorvastatin 40 MG tablet  Commonly known as:  LIPITOR  Take 1 tablet (40 mg total) by mouth daily.     benazepril 20 MG tablet  Commonly known as:  LOTENSIN  Take 40 mg by mouth daily.     diltiazem 30 MG tablet  Commonly known as:  CARDIZEM  Take 30 mg by mouth 4 (four) times daily as needed (heart rate increases).     HUMULIN 70/30 (70-30) 100 UNIT/ML injection  Generic drug:  insulin NPH-regular  Inject 50-60 Units into the skin 2 (two) times daily with a meal. 60 units am and 50 units pm daily.     isosorbide mononitrate 30 MG 24 hr tablet  Commonly known as:  IMDUR  Take 1 tablet (30 mg total) by mouth daily.     metoprolol tartrate 25 MG tablet  Commonly known as:  LOPRESSOR  Take 1 tablet (25 mg total) by mouth 2 (two) times daily.     MULTAQ 400 MG tablet  Generic drug:  dronedarone  Take 400 mg by mouth 2 (two) times daily with a meal.     nitroGLYCERIN 0.4 MG SL tablet  Commonly known as:  NITROSTAT  Place 1 tablet (0.4 mg total) under the tongue every 5 (five) minutes as needed for chest pain.     ONE TOUCH ULTRA TEST test strip  Generic drug:  glucose blood     Rivaroxaban 20 MG Tabs tablet  Commonly known as:  XARELTO  Take 1 tablet (20 mg total) by mouth daily.     sildenafil 100 MG tablet  Commonly known as:  VIAGRA  Take 1 tablet (100 mg total) by mouth as needed.        Disposition:   Future Appointments Provider Department Dept Phone   02/18/2013 8:00 AM [name], MD [facility]  [phone]   03/01/2013 12:00 PM Hillis Range, MD [facility] Office [phone]   05/01/2013 11:15 AM Hillis Range, MD [facility] Office [phone]       Duration of Discharge Encounter: Greater than 30 minutes including physician time.  Signed,

## 2013-01-30 ENCOUNTER — Encounter (HOSPITAL_COMMUNITY): Payer: Self-pay | Admitting: *Deleted

## 2013-01-30 DIAGNOSIS — I4891 Unspecified atrial fibrillation: Secondary | ICD-10-CM

## 2013-01-30 LAB — BASIC METABOLIC PANEL
BUN: 22 mg/dL (ref 6–23)
CO2: 24 mEq/L (ref 19–32)
Chloride: 101 mEq/L (ref 96–112)
GFR calc Af Amer: 74 mL/min — ABNORMAL LOW (ref >90)
Potassium: 4 mEq/L (ref 3.5–5.1)

## 2013-01-30 LAB — GLUCOSE, CAPILLARY
Glucose-Capillary: 252 mg/dL — ABNORMAL HIGH (ref 70–99)
Glucose-Capillary: 244 mg/dL — ABNORMAL HIGH (ref 70–99)

## 2013-01-30 MED ORDER — PANTOPRAZOLE SODIUM 40 MG PO TBEC: 40.0000 mg | DELAYED_RELEASE_TABLET | Freq: Every day | ORAL | Status: DC

## 2013-01-30 MED ORDER — ACETAMINOPHEN 325 MG PO TABS: 650.0000 mg | ORAL_TABLET | Freq: Four times a day (QID) | ORAL | Status: DC | PRN

## 2013-01-30 NOTE — Progress Notes (Signed)
With on and off heart rate of 150's-180's ,pt cough out and bear down for few sec. And heart goes down to 80's-90's. MD made aware, explained to the pt. why the heart rate is rapid at times. Continue to monitor.

## 2013-01-30 NOTE — Progress Notes (Signed)
Discharged home accompanied by wife , stable. Discharge instructions and summary given to pt. Belongings with wife.

## 2013-02-01 ENCOUNTER — Telehealth: Payer: Self-pay

## 2013-02-01 ENCOUNTER — Encounter: Payer: Self-pay | Admitting: Cardiovascular Disease

## 2013-02-01 ENCOUNTER — Ambulatory Visit (INDEPENDENT_AMBULATORY_CARE_PROVIDER_SITE_OTHER): Payer: 59 | Admitting: Cardiovascular Disease

## 2013-02-01 ENCOUNTER — Telehealth: Payer: Self-pay | Admitting: *Deleted

## 2013-02-01 VITALS — BP 128/70 | HR 65 | Ht 71.0 in | Wt 246.5 lb

## 2013-02-01 DIAGNOSIS — R000 Tachycardia, unspecified: Secondary | ICD-10-CM

## 2013-02-01 DIAGNOSIS — I10 Essential (primary) hypertension: Secondary | ICD-10-CM

## 2013-02-01 DIAGNOSIS — I4891 Unspecified atrial fibrillation: Secondary | ICD-10-CM

## 2013-02-01 DIAGNOSIS — I2510 Atherosclerotic heart disease of native coronary artery without angina pectoris: Secondary | ICD-10-CM

## 2013-02-01 MED ORDER — AMIODARONE HCL 200 MG PO TABS: 200.0000 mg | ORAL_TABLET | Freq: Three times a day (TID) | ORAL | Status: DC

## 2013-02-01 MED ORDER — DILTIAZEM HCL ER COATED BEADS 120 MG PO CP24: 120.0000 mg | ORAL_CAPSULE | Freq: Every day | ORAL | Status: DC

## 2013-02-01 NOTE — Telephone Encounter (Signed)
Pt's wife called this am stating that pt is s/p cardiac ablation and his hr has been running high, but she does not have a reading. Instructed her on how to attain heart rate.   States that she was instructed to bring pt to our office to have his "heart shocked back into rhythm." Spoke w/ Dr. Mariah Milling and asked pt to come to office for evaluation.

## 2013-02-01 NOTE — Patient Instructions (Signed)
Please stop amlodipine Please start diltiazem 120 mg pill one a day  Stop multaq Start amiodarone 200 mg twice a day  If you have atrial fib or flutter, Take 1 to 2 extra amiodarone, with diltiazem 30 mg pill as needed  Please call us if you have new issues that need to be addressed before your next appt.  Your physician wants you to follow-up in: 1 month.

## 2013-02-01 NOTE — Assessment & Plan Note (Signed)
Currently with no symptoms of angina. No further workup at this time. Continue current medication regimen. 

## 2013-02-01 NOTE — Assessment & Plan Note (Signed)
Medication changes as above 

## 2013-02-01 NOTE — Assessment & Plan Note (Addendum)
Very symptomatic in followup today. Suspect he has irritation/inflammation following his recent ablation. An effort to improve his symptoms over the next several months while we wait for information to improve, we have suggested he hold his multaq and start amiodarone 200 mg twice a day, hold his amlodipine and start diltiazem 120 g daily. Suggested we could load him with amiodarone but I'm concerned about bradycardia. Suggested he take extra amiodarone 30 mg pills for any tachycardia in addition to diltiazem 30 mg pills. He has followup with Dr. Johney Frame next week

## 2013-02-01 NOTE — Telephone Encounter (Signed)
Called patient to follow up on how he was doing s/p atrial fibrillation ablation 01-29-2013.  Pt stated he had seen Dr Mariah Milling in the [place] office earlier today because he had been woken up this morning from sleep with atrial fibrillation. He had been discharged on Multaq, Metoprolol and Norvasc.  Dr Mariah Milling discontinued Multaq and Norvasc today and added Diltiazem 120mg  daily as well as low dose Amiodarone.   Pt states his pain with inspiration has improved.  His groin is bruised, but no swelling or tenderness.  He does have a dry non productive cough.  No fevers or chills, no trouble swallowing.  He will call back if not improved by Monday.  Otherwise, follow up as scheduled from discharge summary.  Pt aware and agrees with plan.   Gypsy Balsam, RN, BSN 02/01/2013 2:29 PM

## 2013-02-01 NOTE — Progress Notes (Signed)
Patient ID: Douglas Hunter, male    DOB: Nov 08, 1955, 57 y.o.   MRN: [id number]  HPI Comments: Douglas Hunter is a pleasant 57 year old gentleman with CAD, non-STEMI with admission to the hospital in late April 2012 with cardiac catheterization showing severe mid left circumflex disease, subtotally occluded, with collaterals from the RCA to the LAD circumflex, normal ejection fraction who represented to the hospital shortly later with chest pain.  started on medical management,  nitrates and ranexa for his symptoms. His wife reports that he has sleep apnea but has not had a sleep study.  He has had numerous episodes of atrial flutter. He reports symptoms dating back into his 30s.  He has had 2 recent hospitalizations for atrial flutter, 08/11/2012 and recently on 01/11/2013. Both episodes required hospitalization, medications for cardioversion. He has had several episodes that broke on their own  He recently underwent ablation by Dr. Johney Frame. Ablation for typical atrial flutter, atrial fibrillation.  Since the ablation last week, he has had frequent episodes of tachycardia, wife reporting rates up to 160 beats per minute. Frequent waking at nighttime. He presents to the clinic today for urgent add-on for tachycardia developing overnight and continuing this morning. Reports symptoms consistent with prior episodes of arrhythmia. He has been taking extra diltiazem 30 mg pills for symptom relief  In the clinic, heart rate was 65 beats per minute, normal sinus rhythm. He believes the rhythm broke on the car ride over to clinic. He is requesting medication changes for symptom relief.    Labs showed total cholesterol 112, LDL 49, TSH 2.28   Prior Echocardiogram shows normal LV systolic function, essentially normal study, May 2013  Recent lab work shows LDL 81, hemoglobin A1c 8.1, total cholesterol 144, HDL 50 In the hospital total cholesterol 130, LDL 30, HDL 51  EKG today shows normal sinus rhythm with  right bundle branch block, rate 58 beats per minute  Outpatient Encounter Prescriptions as of 02/01/2013  Medication Sig  . acetaminophen (TYLENOL) 325 MG tablet Take 2 tablets (650 mg total) by mouth every 6 (six) hours as needed for pain.  Marland Kitchen aspirin EC 81 MG tablet Take 81 mg by mouth daily.  Marland Kitchen atorvastatin (LIPITOR) 40 MG tablet Take 1 tablet (40 mg total) by mouth daily.  . benazepril (LOTENSIN) 20 MG tablet Take 40 mg by mouth daily.  Marland Kitchen diltiazem (CARDIZEM) 30 MG tablet Take 30 mg by mouth 4 (four) times daily as needed (heart rate increases).  Marland Kitchen HUMULIN 70/30 (70-30) 100 UNIT/ML injection Inject 50-60 Units into the skin 2 (two) times daily with a meal. 60 units am and 50 units pm daily.  . isosorbide mononitrate (IMDUR) 30 MG 24 hr tablet Take 1 tablet (30 mg total) by mouth daily.  . metoprolol tartrate (LOPRESSOR) 25 MG tablet Take 1 tablet (25 mg total) by mouth 2 (two) times daily.  . nitroGLYCERIN (NITROSTAT) 0.4 MG SL tablet Place 1 tablet (0.4 mg total) under the tongue every 5 (five) minutes as needed for chest pain.  . ONE TOUCH ULTRA TEST test strip   . pantoprazole (PROTONIX) 40 MG tablet Take 1 tablet (40 mg total) by mouth daily. For 6 weeks then stop.  . Rivaroxaban (XARELTO) 20 MG TABS Take 1 tablet (20 mg total) by mouth daily.  . sildenafil (VIAGRA) 100 MG tablet Take 1 tablet (100 mg total) by mouth as needed.  Marland Kitchen  amLODipine (NORVASC) 10 MG tablet Take 5 mg by mouth daily.  . MULTAQ 400  MG tablet Take 400 mg by mouth 2 (two) times daily with a meal.     Review of Systems  Constitutional: Negative.   HENT: Negative.   Eyes: Negative.   Respiratory: Negative.   Cardiovascular: Positive for palpitations.       Tachycardia  Gastrointestinal: Negative.   Endocrine: Negative.   Musculoskeletal: Negative.   Skin: Negative.   Allergic/Immunologic: Negative.   Neurological: Negative.   Hematological: Negative.   Psychiatric/Behavioral: Negative.   All other systems  reviewed and are negative.   BP 128/70  Pulse 65  Ht 5\' 11"  (1.803 m)  Wt 246 lb 8 oz (111.812 kg)  BMI 34.4 kg/m2  Physical Exam  Nursing note and vitals reviewed. Constitutional: He is oriented to person, place, and time. He appears well-developed and well-nourished.  HENT:  Head: Normocephalic.  Nose: Nose normal.  Mouth/Throat: Oropharynx is clear and moist.  Eyes: Conjunctivae are normal. Pupils are equal, round, and reactive to light.  Neck: Normal range of motion. Neck supple. No JVD present.  Cardiovascular: Normal rate, regular rhythm, S1 normal, S2 normal, normal heart sounds and intact distal pulses.  Exam reveals no gallop and no friction rub.   No murmur heard. Pulmonary/Chest: Effort normal and breath sounds normal. No respiratory distress. He has no wheezes. He has no rales. He exhibits no tenderness.  Abdominal: Soft. Bowel sounds are normal. He exhibits no distension. There is no tenderness.  Musculoskeletal: Normal range of motion. He exhibits no edema and no tenderness.  Lymphadenopathy:    He has no cervical adenopathy.  Neurological: He is alert and oriented to person, place, and time. Coordination normal.  Skin: Skin is warm and dry. No rash noted. No erythema.  Psychiatric: He has a normal mood and affect. His behavior is normal. Judgment and thought content normal.      Assessment and Plan

## 2013-02-05 ENCOUNTER — Encounter: Payer: Self-pay | Admitting: Physician Assistant

## 2013-02-05 ENCOUNTER — Ambulatory Visit (INDEPENDENT_AMBULATORY_CARE_PROVIDER_SITE_OTHER): Payer: 59 | Admitting: Physician Assistant

## 2013-02-05 ENCOUNTER — Telehealth: Payer: Self-pay

## 2013-02-05 VITALS — BP 138/76 | HR 55 | Ht 71.0 in | Wt 245.0 lb

## 2013-02-05 DIAGNOSIS — I4891 Unspecified atrial fibrillation: Secondary | ICD-10-CM

## 2013-02-05 DIAGNOSIS — I4892 Unspecified atrial flutter: Secondary | ICD-10-CM

## 2013-02-05 NOTE — Progress Notes (Signed)
Patient ID: Douglas Hunter, male   DOB: [birth year], 57 y.o.   MRN: [id number]   Date:  02/05/2013   ID:  Douglas Hunter, DOB 1955-11-27, MRN [id number]  PCP:  Vonita Moss, MD  Primary Cardiologist:  Concha Se, MD Primary Electrophysiologist:  Shela Commons. Allred, MD    History of Present Illness: [name] is a 57 y.o. male w/ PMHx s/f PAF, typical atrial flutter (s/p RFCA 01/29/13), CAD (s/p cath 07/2010 showing subtotal LCx occlusion with collaterals from the RCA and LAD, medically managed), chronic RBBB, DM2, HTN, HLD, OSA (not had a sleep study), obesity and GERD who presents today for palpitations.  He underwent pulmonary vein isolation + ablation and ablation of two discrete atrial flutter foci on 01/29/13. He tolerated the procedure well w/o complications and was discharged on home dilt 30mg  q6hr and Multaq.   He followed up with Dr. Mariah Milling on 10/31. He had been having several episodes of tachy-palpitations, HR as high as 160s. He was using diltiazem 30mg  PRN for rate-control. Irritation/inflammation and healing from recent ablation procedure was suspected to be contributing. Multaq was replaced with amiodarone 200mg  BID. Long-acting diltiazem 120mg  replaced amlodipine. Amio load not prescribed with concerns for bradycardia (HR low in past). Amiodarone 30mg  and dilt 30mg  PO PRN were recommended for breakthrough tachycardia. He has follow-up with Dr. Johney Frame on 11/28.  Labwork 01/2013: TSH 2.28, LDL 49, HDL 47, TG 80 on 01/12/13   He has continued to experience palpitations despite PRN measures described above. He had an episode of tach-palpitations and "flip flopping" from 5pm to midnight yesterday at rest. No lightheadedness, syncope or chest pain. No SOB/DOE. No caffeine or EtOH. He comes in today requesting further control of these palpitations.   EKG: NSR, 55 bpm, RBBB, no ST/T changes  Wt Readings from Last 3 Encounters:  02/05/13 245 lb (111.131 kg)  02/01/13 246 lb 8 oz (111.812 kg)    01/30/13 246 lb 7.6 oz (111.8 kg)     Past Medical History  Diagnosis Date  . Hypertension   . Diabetes mellitus   . Hyperlipidemia   . Coronary artery disease 4/12    NSTEMI, occluded RCA with collaterals, no intervention required  . Atrial flutter   . Paroxysmal atrial fibrillation   . RBBB   . GERD (gastroesophageal reflux disease)     Current Outpatient Prescriptions  Medication Sig Dispense Refill  . amiodarone (PACERONE) 200 MG tablet Take 2 tablets (400 mg total) by mouth BID (two) times daily.  90 tablet  3  . aspirin EC 81 MG tablet Take 81 mg by mouth daily.      Marland Kitchen atorvastatin (LIPITOR) 40 MG tablet Take 1 tablet (40 mg total) by mouth daily.  30 tablet  6  . benazepril (LOTENSIN) 20 MG tablet Take 20 mg by mouth daily.       Marland Kitchen diltiazem (CARDIZEM CD) 120 MG 24 hr capsule Take 1 capsule (120 mg total) by mouth daily.  30 capsule  6  . diltiazem (CARDIZEM) 30 MG tablet Take 30 mg by mouth 4 (four) times daily as needed (heart rate increases).      Marland Kitchen HUMULIN 70/30 (70-30) 100 UNIT/ML injection Inject 50-60 Units into the skin 2 (two) times daily with a meal. 60 units am and 50 units pm daily.      . isosorbide mononitrate (IMDUR) 30 MG 24 hr tablet Take 1 tablet (30 mg total) by mouth daily.  30 tablet  6  . metoprolol tartrate (LOPRESSOR) 25  MG tablet Take 25 mg by mouth daily.      . nitroGLYCERIN (NITROSTAT) 0.4 MG SL tablet Place 1 tablet (0.4 mg total) under the tongue every 5 (five) minutes as needed for chest pain.  25 tablet  6  . ONE TOUCH ULTRA TEST test strip       . pantoprazole (PROTONIX) 40 MG tablet Take 1 tablet (40 mg total) by mouth daily. For 6 weeks then stop.  30 tablet  1  . Rivaroxaban (XARELTO) 20 MG TABS Take 1 tablet (20 mg total) by mouth daily.  30 tablet  5  . sildenafil (VIAGRA) 100 MG tablet Take 1 tablet (100 mg total) by mouth as needed.  10 tablet  1   No current facility-administered medications for this visit.    Allergies:     Allergies  Allergen Reactions  . No Known Allergies     Social History:  The patient  reports that he quit smoking about 5 years ago. His smoking use included Cigarettes. He has a 52.5 pack-year smoking history. He does not have any smokeless tobacco history on file. He reports that he drinks alcohol. He reports that he does not use illicit drugs.   Family History:  Family History  Problem Relation Age of Onset  . Heart attack Maternal Uncle   . Heart attack Father 28    Review of Systems: General: negative for chills, fever, night sweats or weight changes.  Cardiovascular: positive for palpitations, negative for chest pain, dyspnea on exertion, edema, orthopnea, paroxysmal nocturnal dyspnea or shortness of breath Dermatological: negative for rash Respiratory:  negative for cough or wheezing Urologic:  negative for hematuria Abdominal: negative for nausea, vomiting, diarrhea, bright red blood per rectum, melena, or hematemesis Neurologic:  negative for visual changes, syncope, or dizziness All other systems reviewed and are otherwise negative except as noted above.  PHYSICAL EXAM: VS:  BP 138/76  Pulse 55  Ht 5\' 11"  (1.803 m)  Wt 245 lb (111.131 kg)  BMI 34.19 kg/m2 Well nourished, well developed, in no acute distress HEENT: normal, PERRL Neck: no JVD or bruits Cardiac:  normal S1, S2; RRR; no murmur or gallops Lungs:  clear to auscultation bilaterally, no wheezing, rhonchi or rales Abd: soft, nontender, no hepatomegaly, normoactive BS x 4 quads Ext: no edema, cyanosis or clubbing Skin: warm and dry, cap refill < 2 sec Neuro:  CNs 2-12 intact, no focal abnormalities noted Musculoskeletal: strength and tone appropriate for age  Psych: normal affect

## 2013-02-05 NOTE — Assessment & Plan Note (Addendum)
Patient continues to experience palpitations since recent ablation. Reassured given that he still has some degree of inflammation/irritation, these episodes are expected in the short term, and should improve with further recovery. Discussed further measures with Dr. Mariah Milling, will increase amiodarone to 400mg  PO BID. Amiodarone likely has not reached therapeutic concentrations at this point. Continue long-acting diltiazem, additional amiodarone and diltiazem 30 PRN for breakthrough palpitations. He will continue Xarelto as prescribed. He will continue to monitor these episodes and symptoms as he recovers further.

## 2013-02-05 NOTE — Patient Instructions (Signed)
We will increase your amiodarone to 400 mg (two tablets) twice a day for 4-5 days. Then back to 200mg  (one tablet) twice a day.   Continue to take amiodarone 200mg  one to two tablets as needed for breakthrough fluttering.  Continue to take diltiazem 30mg  one tablet as needed for breakthrough fluttering.   We will be in contact with Dr. Johney Frame regarding further recommendations.   Suspect that as you continued to heal and recover, and as amiodarone has time to develop a sufficient concentration in your body, these episodes will become less frequent and improve. These break through episodes of fluttering are expected shortly after an ablation procedure due to irritation/inflammation of the area.

## 2013-02-05 NOTE — Telephone Encounter (Signed)
Pt called stating that he is still in afib.   He does not know the rate, but states that his heart is "skipping, fluttering, and carrying on". He has been following instructions given on his last ov and taking extra amiodarone, but it does not seem to be helping.  He would really like to come in and been seen today. Pt sched to see Alinda Money, [place] today at 2:00.

## 2013-02-10 ENCOUNTER — Encounter (HOSPITAL_COMMUNITY): Payer: Self-pay | Admitting: *Deleted

## 2013-02-12 ENCOUNTER — Telehealth: Payer: Self-pay

## 2013-02-12 NOTE — Telephone Encounter (Signed)
Message copied by Marilynne Halsted on Tue Feb 12, 2013  4:43 PM ------      Message from: Odella Aquas A      Created: Mon Feb 11, 2013  7:50 AM       Can we contact Mr. Hamrick to follow-up on his symptoms? If persisting, I'd like to offer him the option of colchicine outlined by Dr. Johney Frame below. Just let me know and I can send in to his pharmacy if needed.             Thanks,             Alinda Money                  ----- Message -----         From: Hillis Range, MD         Sent: 02/08/2013  10:43 PM           To: Gery Pray, PA-C            You could add colchicine 0.6mg  BID x 3 months. This has been supported in the literature to decrease afib post ablation during the inflammatory/ healing phase.  Otherwise, I think you have covered it pretty well.                        ----- Message -----         From: Gery Pray, PA-C         Sent: 02/05/2013   9:39 PM           To: Hillis Range, MD            Hey Dr. Johney Frame, do you have any further recommendations for this gentleman regarding breakthrough palpitations in the short term post-AF/flutter ablation? Have increased amio, continued long acting dilt and PRN amio and dilt. Encouraged that further recovery will lead to improvement. Lesia Hausen             ------

## 2013-02-12 NOTE — Telephone Encounter (Signed)
Left message on machine for pt to call back.

## 2013-02-13 NOTE — Telephone Encounter (Signed)
Spoke w/ pt.  He reports that he is feeling "pretty good" and that his "episodes have cut way back".   He has not had any symptoms since Monday. States that he would like to hold off on the colchicine and see if he continues to improve. Pt to call the office if he would like rx sent in.

## 2013-02-18 ENCOUNTER — Ambulatory Visit: Payer: 59 | Admitting: Cardiovascular Disease

## 2013-03-01 ENCOUNTER — Encounter: Payer: Self-pay | Admitting: Internal Medicine

## 2013-03-01 ENCOUNTER — Ambulatory Visit (INDEPENDENT_AMBULATORY_CARE_PROVIDER_SITE_OTHER): Payer: 59 | Admitting: Internal Medicine

## 2013-03-01 VITALS — BP 143/69 | HR 53 | Ht 71.0 in | Wt 253.1 lb

## 2013-03-01 DIAGNOSIS — I4892 Unspecified atrial flutter: Secondary | ICD-10-CM

## 2013-03-01 DIAGNOSIS — I4891 Unspecified atrial fibrillation: Secondary | ICD-10-CM

## 2013-03-01 MED ORDER — AMIODARONE HCL 200 MG PO TABS: 200.0000 mg | ORAL_TABLET | Freq: Every day | ORAL | Status: DC

## 2013-03-01 NOTE — Patient Instructions (Signed)
Your physician has recommended you make the following change in your medication:  1) Decrease your Amiodarone to 200 mg daily  Your physician recommends that you schedule a follow-up appointment in: keep schedule appointment on 05/01/2013 at 11:15 am with Dr. Johney Frame.

## 2013-03-01 NOTE — Progress Notes (Signed)
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  Since his afib ablation, the patient reports doing reasonably well.  He did have ERAF early for which Dr Mariah Milling started amiodarone.  He has done very well with this.  He has had no afib in 2 weeks.  He denies procedure related complications.  Today, he denies symptoms of palpitations, chest pain, shortness of breath,  lower extremity edema, dizziness, presyncope, or syncope.  The patient is otherwise without complaint today.   Past Medical History  Diagnosis Date  . Hypertension   . Diabetes mellitus   . Hyperlipidemia   . Coronary artery disease 4/12    NSTEMI, occluded RCA with collaterals, no intervention required  . Atrial flutter   . Paroxysmal atrial fibrillation   . RBBB   . GERD (gastroesophageal reflux disease)    Past Surgical History  Procedure Laterality Date  . Cardiac catheterization  08/03/2011  . Tee without cardioversion N/A 01/28/2013    Procedure: TRANSESOPHAGEAL ECHOCARDIOGRAM (TEE);  Surgeon: [name], MD;  Location: [facility] ENDOSCOPY;  Service: Cardiovascular;  Laterality: N/A;  . Ablation  01/29/2013    PVI and CTI by Dr Johney Frame for atrial flutter and paroxysmal atrial fibrillation    Current Outpatient Prescriptions  Medication Sig Dispense Refill  . amiodarone (PACERONE) 200 MG tablet Take 1 tablet (200 mg total) by mouth 3 (three) times daily.  90 tablet  3  . amoxicillin (AMOXIL) 875 MG tablet Take 1 tablet by mouth 2 (two) times daily.       Marland Kitchen aspirin EC 81 MG tablet Take 81 mg by mouth daily.      Marland Kitchen atorvastatin (LIPITOR) 40 MG tablet Take 1 tablet (40 mg total) by mouth daily.  30 tablet  6  . benazepril (LOTENSIN) 20 MG tablet Take 20 mg by mouth daily.       Marland Kitchen diltiazem (CARDIZEM CD) 120 MG 24 hr capsule Take 1 capsule (120 mg total) by mouth daily.  30 capsule  6  . diltiazem (CARDIZEM) 30 MG tablet  Take 30 mg by mouth 4 (four) times daily as needed (heart rate increases).      Marland Kitchen HUMULIN 70/30 (70-30) 100 UNIT/ML injection Inject 50-60 Units into the skin 2 (two) times daily with a meal. 60 units am and 50 units pm daily.      . isosorbide mononitrate (IMDUR) 30 MG 24 hr tablet Take 1 tablet (30 mg total) by mouth daily.  30 tablet  6  . metoprolol tartrate (LOPRESSOR) 25 MG tablet Take 25 mg by mouth daily.      . nitroGLYCERIN (NITROSTAT) 0.4 MG SL tablet Place 1 tablet (0.4 mg total) under the tongue every 5 (five) minutes as needed for chest pain.  25 tablet  6  . pantoprazole (PROTONIX) 40 MG tablet Take 1 tablet (40 mg total) by mouth daily. For 6 weeks then stop.  30 tablet  1  . Rivaroxaban (XARELTO) 20 MG TABS Take 1 tablet (20 mg total) by mouth daily.  30 tablet  5  . sildenafil (VIAGRA) 100 MG tablet Take 1 tablet (100 mg total) by mouth as needed.  10 tablet  1   No current facility-administered medications for this visit.    Physical Exam: Filed Vitals:   03/01/13 1144  BP: 143/69  Pulse: 53  Height: 5\' 11"  (1.803 m)  Weight: 253 lb 1.9 oz (114.814 kg)  GEN- The patient is well appearing, alert and oriented x 3 today.   Head- normocephalic, atraumatic Eyes-  Sclera clear, conjunctiva pink Ears- hearing intact Oropharynx- clear Lungs- Clear to ausculation bilaterally, normal work of breathing Heart- Regular rate and rhythm, no murmurs, rubs or gallops, PMI not laterally displaced GI- soft, NT, ND, + BS Extremities- no clubbing, cyanosis, or edema  ekg today reveals sinus bradycardia 53 bpm, RBBB, Qtc 463  Assessment and Plan:  1. afib Doing well s/p ablation Decrease amiodarone to 200mg  daily No other changes today  I will see again as scheduled in January He will contact my office should any problems arise in the interim

## 2013-03-07 ENCOUNTER — Ambulatory Visit: Payer: 59 | Admitting: Cardiovascular Disease

## 2013-03-08 ENCOUNTER — Encounter: Payer: Self-pay | Admitting: Cardiovascular Disease

## 2013-03-08 ENCOUNTER — Ambulatory Visit (INDEPENDENT_AMBULATORY_CARE_PROVIDER_SITE_OTHER): Payer: 59 | Admitting: Cardiovascular Disease

## 2013-03-08 VITALS — BP 136/64 | HR 51 | Ht 71.0 in | Wt 253.0 lb

## 2013-03-08 DIAGNOSIS — I4892 Unspecified atrial flutter: Secondary | ICD-10-CM

## 2013-03-08 DIAGNOSIS — E119 Type 2 diabetes mellitus without complications: Secondary | ICD-10-CM

## 2013-03-08 DIAGNOSIS — I10 Essential (primary) hypertension: Secondary | ICD-10-CM

## 2013-03-08 DIAGNOSIS — I2510 Atherosclerotic heart disease of native coronary artery without angina pectoris: Secondary | ICD-10-CM

## 2013-03-08 DIAGNOSIS — I4891 Unspecified atrial fibrillation: Secondary | ICD-10-CM

## 2013-03-08 NOTE — Assessment & Plan Note (Signed)
Currently with no symptoms of angina. No further workup at this time. Continue current medication regimen. 

## 2013-03-08 NOTE — Assessment & Plan Note (Signed)
We have encouraged continued exercise, careful diet management in an effort to lose weight. 

## 2013-03-08 NOTE — Assessment & Plan Note (Signed)
Blood pressure is well controlled on today's visit. No changes made to the medications. 

## 2013-03-08 NOTE — Patient Instructions (Signed)
You are doing well. No medication changes were made.  Please call us if you have new issues that need to be addressed before your next appt.  Your physician wants you to follow-up in: 12 months.  You will receive a reminder letter in the mail two months in advance. If you don't receive a letter, please call our office to schedule the follow-up appointment. 

## 2013-03-08 NOTE — Progress Notes (Signed)
Patient ID: Douglas Hunter, male    DOB: Aug 21, 1955, 57 y.o.   MRN: [id number]  HPI Comments: Douglas Hunter is a pleasant 57 year old gentleman with CAD, non-STEMI with admission to the hospital in late April 2012 with cardiac catheterization showing severe mid left circumflex disease, subtotally occluded, with collaterals from the RCA to the LAD circumflex, normal ejection fraction who represented to the hospital shortly later with chest pain.  started on medical management,  nitrates and ranexa for his symptoms. Likely sleep apnea but has not had a sleep study.  He has had numerous episodes of atrial flutter. He reports symptoms dating back into his 30s.  2 recent hospitalizations for atrial flutter, 08/11/2012 and recently on 01/11/2013. Both episodes required hospitalization, medications for cardioversion. He has had several episodes that broke on their own  He recently underwent ablation by Dr. Johney Frame. Ablation for typical atrial flutter, atrial fibrillation.  Several episodes of flutter following the ablation that resolved with a amiodarone. Dose was recently decreased down to 200 mg daily. Overall he feels well with no complaints.   Labs from [facility] showed total cholesterol 112, LDL 49, TSH 2.28   Prior Echocardiogram shows normal LV systolic function, essentially normal study, May 2013  Recent lab work shows LDL 81, hemoglobin A1c 8.1, total cholesterol 144, HDL 50  EKG today shows normal sinus rhythm with right bundle branch block, rate 51 beats per minute  Outpatient Encounter Prescriptions as of 03/08/2013  Medication Sig  . amiodarone (PACERONE) 200 MG tablet Take 1 tablet (200 mg total) by mouth daily.  Marland Kitchen aspirin EC 81 MG tablet Take 81 mg by mouth daily.  Marland Kitchen atorvastatin (LIPITOR) 40 MG tablet Take 1 tablet (40 mg total) by mouth daily.  . benazepril (LOTENSIN) 20 MG tablet Take 20 mg by mouth daily.   Marland Kitchen diltiazem (CARDIZEM CD) 120 MG 24 hr capsule Take 1 capsule (120 mg total) by  mouth daily.  Marland Kitchen diltiazem (CARDIZEM) 30 MG tablet Take 30 mg by mouth 4 (four) times daily as needed (heart rate increases).  Marland Kitchen HUMULIN 70/30 (70-30) 100 UNIT/ML injection Inject 50-60 Units into the skin 2 (two) times daily with a meal. 60 units am and 50 units pm daily.  . isosorbide mononitrate (IMDUR) 30 MG 24 hr tablet Take 1 tablet (30 mg total) by mouth daily.  . metoprolol tartrate (LOPRESSOR) 25 MG tablet Take 25 mg by mouth daily.  . nitroGLYCERIN (NITROSTAT) 0.4 MG SL tablet Place 1 tablet (0.4 mg total) under the tongue every 5 (five) minutes as needed for chest pain.  . pantoprazole (PROTONIX) 40 MG tablet Take 1 tablet (40 mg total) by mouth daily. For 6 weeks then stop.  . Rivaroxaban (XARELTO) 20 MG TABS Take 1 tablet (20 mg total) by mouth daily.  . sildenafil (VIAGRA) 100 MG tablet Take 1 tablet (100 mg total) by mouth as needed.  . [DISCONTINUED] amoxicillin (AMOXIL) 875 MG tablet Take 1 tablet by mouth 2 (two) times daily.      Review of Systems  Constitutional: Negative.   HENT: Negative.   Eyes: Negative.   Respiratory: Negative.   Gastrointestinal: Negative.   Endocrine: Negative.   Musculoskeletal: Negative.   Skin: Negative.   Allergic/Immunologic: Negative.   Neurological: Negative.   Hematological: Negative.   Psychiatric/Behavioral: Negative.   All other systems reviewed and are negative.   BP 136/64  Pulse 51  Ht 5\' 11"  (1.803 m)  Wt 253 lb (114.76 kg)  BMI 35.30 kg/m2  Physical Exam  Nursing note and vitals reviewed. Constitutional: He is oriented to person, place, and time. He appears well-developed and well-nourished.  HENT:  Head: Normocephalic.  Nose: Nose normal.  Mouth/Throat: Oropharynx is clear and moist.  Eyes: Conjunctivae are normal. Pupils are equal, round, and reactive to light.  Neck: Normal range of motion. Neck supple. No JVD present.  Cardiovascular: Normal rate, regular rhythm, S1 normal, S2 normal, normal heart sounds and  intact distal pulses.  Exam reveals no gallop and no friction rub.   No murmur heard. Pulmonary/Chest: Effort normal and breath sounds normal. No respiratory distress. He has no wheezes. He has no rales. He exhibits no tenderness.  Abdominal: Soft. Bowel sounds are normal. He exhibits no distension. There is no tenderness.  Musculoskeletal: Normal range of motion. He exhibits no edema and no tenderness.  Lymphadenopathy:    He has no cervical adenopathy.  Neurological: He is alert and oriented to person, place, and time. Coordination normal.  Skin: Skin is warm and dry. No rash noted. No erythema.  Psychiatric: He has a normal mood and affect. His behavior is normal. Judgment and thought content normal.      Assessment and Plan

## 2013-03-08 NOTE — Assessment & Plan Note (Signed)
Recent ablation. He has followup with Dr. Johney Frame in January 2015. At that time he could have a discussion whether he needs to stay on amiodarone and anticoagulation

## 2013-03-29 ENCOUNTER — Emergency Department: Payer: Self-pay | Admitting: Emergency Medicine

## 2013-04-03 ENCOUNTER — Encounter: Payer: Self-pay | Admitting: Internal Medicine

## 2013-04-29 ENCOUNTER — Other Ambulatory Visit: Payer: Self-pay | Admitting: Cardiovascular Disease

## 2013-05-01 ENCOUNTER — Encounter: Payer: 59 | Admitting: Internal Medicine

## 2013-05-08 ENCOUNTER — Encounter: Payer: Self-pay | Admitting: Internal Medicine

## 2013-05-08 ENCOUNTER — Ambulatory Visit (INDEPENDENT_AMBULATORY_CARE_PROVIDER_SITE_OTHER): Payer: 59 | Admitting: Internal Medicine

## 2013-05-08 VITALS — BP 144/72 | HR 52 | Ht 71.0 in | Wt 249.8 lb

## 2013-05-08 DIAGNOSIS — I2510 Atherosclerotic heart disease of native coronary artery without angina pectoris: Secondary | ICD-10-CM

## 2013-05-08 DIAGNOSIS — I10 Essential (primary) hypertension: Secondary | ICD-10-CM

## 2013-05-08 DIAGNOSIS — I4891 Unspecified atrial fibrillation: Secondary | ICD-10-CM

## 2013-05-08 NOTE — Addendum Note (Signed)
Addended by: Janan Halter F on: 05/08/2013 10:51 AM   Modules accepted: Orders, Medications

## 2013-05-08 NOTE — Patient Instructions (Signed)
Your physician recommends that you schedule a follow-up appointment in: 3 months with Dr Rayann Heman   Your physician has recommended you make the following change in your medication:  1) Stop Amiodarone

## 2013-05-08 NOTE — Progress Notes (Signed)
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  Since his last visit, the patient reports doing very well.  He has.  He has done very well with this.  He has had no afib. Today, he denies symptoms of palpitations, chest pain, shortness of breath,  lower extremity edema, dizziness, presyncope, or syncope.  The patient is otherwise without complaint today.   Past Medical History  Diagnosis Date  . Hypertension   . Diabetes mellitus   . Hyperlipidemia   . Coronary artery disease 4/12    NSTEMI, occluded RCA with collaterals, no intervention required  . Atrial flutter   . Paroxysmal atrial fibrillation   . RBBB   . GERD (gastroesophageal reflux disease)    Past Surgical History  Procedure Laterality Date  . Cardiac catheterization  08/03/2011  . Tee without cardioversion N/A 01/28/2013    Procedure: TRANSESOPHAGEAL ECHOCARDIOGRAM (TEE);  Surgeon: Lelon Perla, MD;  Location: [facility] ENDOSCOPY;  Service: Cardiovascular;  Laterality: N/A;  . Ablation  01/29/2013    PVI and CTI by Dr Rayann Heman for atrial flutter and paroxysmal atrial fibrillation    Current Outpatient Prescriptions  Medication Sig Dispense Refill  . amiodarone (PACERONE) 200 MG tablet Take 1 tablet (200 mg total) by mouth daily.  30 tablet  6  . aspirin EC 81 MG tablet Take 81 mg by mouth daily.      Marland Kitchen atorvastatin (LIPITOR) 40 MG tablet Take 1 tablet (40 mg total) by mouth daily.  30 tablet  6  . benazepril (LOTENSIN) 20 MG tablet Take 20 mg by mouth daily.       Marland Kitchen diltiazem (CARDIZEM) 30 MG tablet Take 30 mg by mouth 4 (four) times daily as needed (heart rate increases).      Marland Kitchen diltiazem (DILACOR XR) 120 MG 24 hr capsule Take 120 mg by mouth daily.      Marland Kitchen HUMULIN 70/30 (70-30) 100 UNIT/ML injection Inject 50-60 Units into the skin 2 (two) times daily with a meal. 60 units am and 50 units pm daily.      . isosorbide  mononitrate (IMDUR) 30 MG 24 hr tablet Take 1 tablet (30 mg total) by mouth daily.  30 tablet  6  . metoprolol tartrate (LOPRESSOR) 25 MG tablet Take 25 mg by mouth 2 (two) times daily.       . nitroGLYCERIN (NITROSTAT) 0.4 MG SL tablet Place 1 tablet (0.4 mg total) under the tongue every 5 (five) minutes as needed for chest pain.  25 tablet  6  . sildenafil (VIAGRA) 100 MG tablet Take 1 tablet (100 mg total) by mouth as needed.  10 tablet  1  . XARELTO 20 MG TABS tablet TAKE 1 TABLET BY MOUTH EVERY DAY  30 tablet  6   No current facility-administered medications for this visit.    Physical Exam: Filed Vitals:   05/08/13 0955  BP: 144/72  Pulse: 52  Height: 5\' 11"  (1.803 m)  Weight: 249 lb 12.8 oz (113.309 kg)    GEN- The patient is well appearing, alert and oriented x 3 today.   Head- normocephalic, atraumatic Eyes-  Sclera clear, conjunctiva pink Ears- hearing intact Oropharynx- clear Lungs- Clear to ausculation bilaterally, normal work of breathing Heart- Regular rate and rhythm, no murmurs, rubs or gallops, PMI not laterally displaced GI- soft, NT, ND, + BS Extremities- no clubbing, cyanosis, or edema  ekg today reveals sinus  bradycardia 52 bpm, RBBB, Qtc 422  Assessment and Plan:  1. afib Doing well s/p ablation Stop amiodarone chads2vasc score is at least 3.  We will plan to continue anticoagulation long term.   2. CAD No ischemic symptoms Could consider stopping ASA as he will require long term oral anticoagulation with xarelto.  I will defer to Dr Rockey Situ.  3. HTN Stable No change required today  Return for EP follow-up in 3 months

## 2013-06-27 ENCOUNTER — Other Ambulatory Visit: Payer: Self-pay | Admitting: Cardiovascular Disease

## 2013-07-17 ENCOUNTER — Other Ambulatory Visit: Payer: Self-pay | Admitting: Cardiovascular Disease

## 2013-08-07 ENCOUNTER — Encounter: Payer: Self-pay | Admitting: Internal Medicine

## 2013-08-07 ENCOUNTER — Ambulatory Visit (INDEPENDENT_AMBULATORY_CARE_PROVIDER_SITE_OTHER): Payer: 59 | Admitting: Internal Medicine

## 2013-08-07 VITALS — BP 144/65 | HR 56 | Ht 71.0 in | Wt 258.2 lb

## 2013-08-07 DIAGNOSIS — I10 Essential (primary) hypertension: Secondary | ICD-10-CM

## 2013-08-07 DIAGNOSIS — I4891 Unspecified atrial fibrillation: Secondary | ICD-10-CM

## 2013-08-07 DIAGNOSIS — I2510 Atherosclerotic heart disease of native coronary artery without angina pectoris: Secondary | ICD-10-CM

## 2013-08-07 NOTE — Patient Instructions (Signed)
Your physician wants you to follow-up in: 6 months with Dr Vallery Ridge will receive a reminder letter in the mail two months in advance. If you don't receive a letter, please call our office to schedule the follow-up appointment.  Your physician has recommended you make the following change in your medication:  1) Stop Diltiazem

## 2013-08-07 NOTE — Progress Notes (Signed)
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  Since his last visit, the patient reports doing very well.  He has had no symptoms of afib.  Today, he denies symptoms of palpitations, chest pain, shortness of breath,  lower extremity edema, dizziness, presyncope, or syncope.  The patient is otherwise without complaint today.   Past Medical History  Diagnosis Date  . Hypertension   . Diabetes mellitus   . Hyperlipidemia   . Coronary artery disease 4/12    NSTEMI, occluded RCA with collaterals, no intervention required  . Atrial flutter   . Paroxysmal atrial fibrillation   . RBBB   . GERD (gastroesophageal reflux disease)    Past Surgical History  Procedure Laterality Date  . Cardiac catheterization  08/03/2011  . Tee without cardioversion N/A 01/28/2013    Procedure: TRANSESOPHAGEAL ECHOCARDIOGRAM (TEE);  Surgeon: Lelon Perla, MD;  Location: [facility] ENDOSCOPY;  Service: Cardiovascular;  Laterality: N/A;  . Ablation  01/29/2013    PVI and CTI by Dr Rayann Heman for atrial flutter and paroxysmal atrial fibrillation    Current Outpatient Prescriptions  Medication Sig Dispense Refill  . aspirin EC 81 MG tablet Take 81 mg by mouth daily.      Marland Kitchen atorvastatin (LIPITOR) 40 MG tablet Take 1 tablet (40 mg total) by mouth daily.  30 tablet  6  . benazepril (LOTENSIN) 20 MG tablet Take 20 mg by mouth daily.       Marland Kitchen diltiazem (CARDIZEM) 30 MG tablet Take 30 mg by mouth 4 (four) times daily as needed (heart rate increases).      Marland Kitchen HUMULIN 70/30 (70-30) 100 UNIT/ML injection Inject 50-60 Units into the skin 2 (two) times daily with a meal. 60 units am and 50 units pm daily.      . isosorbide mononitrate (IMDUR) 30 MG 24 hr tablet TAKE 1 TABLET BY MOUTH EVERY DAY  30 tablet  0  . metoprolol tartrate (LOPRESSOR) 25 MG tablet Take 25 mg by mouth 2 (two) times daily.       . nitroGLYCERIN (NITROSTAT) 0.4 MG  SL tablet Place 1 tablet (0.4 mg total) under the tongue every 5 (five) minutes as needed for chest pain.  25 tablet  6  . VIAGRA 100 MG tablet TAKE 1 TABLET BY MOUTH AS NEEDED  10 tablet  1  . XARELTO 20 MG TABS tablet TAKE 1 TABLET BY MOUTH EVERY DAY  30 tablet  6   No current facility-administered medications for this visit.    Physical Exam: Filed Vitals:   08/07/13 0932  BP: 144/65  Pulse: 56  Height: 5\' 11"  (1.803 m)  Weight: 258 lb 3.2 oz (117.119 kg)    GEN- The patient is well appearing, alert and oriented x 3 today.   Head- normocephalic, atraumatic Eyes-  Sclera clear, conjunctiva pink Ears- hearing intact Oropharynx- clear Lungs- Clear to ausculation bilaterally, normal work of breathing Heart- Regular rate and rhythm, no murmurs, rubs or gallops, PMI not laterally displaced GI- soft, NT, ND, + BS Extremities- no clubbing, cyanosis, or edema  ekg today reveals sinus bradycardia 56 bpm, RBBB, Qtc 430  Assessment and Plan:  1. afib Doing well s/p ablation off of AADs chads2vasc score is at least 3.  We will plan to continue anticoagulation long term.  Stop diltiazem today  2. CAD No ischemic symptoms Could consider stopping ASA as he will require long term  oral anticoagulation with xarelto.  I will defer to Dr Rockey Situ.  3. HTN Stable No change required today  Return for EP follow-up in 6 months

## 2013-08-08 ENCOUNTER — Other Ambulatory Visit: Payer: Self-pay | Admitting: Cardiovascular Disease

## 2013-09-03 ENCOUNTER — Other Ambulatory Visit: Payer: Self-pay | Admitting: Cardiovascular Disease

## 2013-11-28 ENCOUNTER — Other Ambulatory Visit: Payer: Self-pay | Admitting: Cardiovascular Disease

## 2013-12-25 ENCOUNTER — Other Ambulatory Visit: Payer: Self-pay | Admitting: Cardiovascular Disease

## 2014-01-27 ENCOUNTER — Emergency Department: Payer: Self-pay | Admitting: Emergency Medicine

## 2014-01-27 LAB — CBC
HCT: 43.9 % (ref 40.0–52.0)
HGB: 14.3 g/dL (ref 13.0–18.0)
MCH: 31.3 pg (ref 26.0–34.0)
MCHC: 32.4 g/dL (ref 32.0–36.0)
MCV: 97 fL (ref 80–100)
Platelet: 257 10*3/uL (ref 150–440)
RBC: 4.55 10*6/uL (ref 4.40–5.90)
RDW: 13.4 % (ref 11.5–14.5)
WBC: 11.2 10*3/uL — AB (ref 3.8–10.6)

## 2014-01-27 LAB — BASIC METABOLIC PANEL
ANION GAP: 10 (ref 7–16)
BUN: 12 mg/dL (ref 7–18)
CHLORIDE: 105 mmol/L (ref 98–107)
Calcium, Total: 8.5 mg/dL (ref 8.5–10.1)
Co2: 26 mmol/L (ref 21–32)
Creatinine: 0.87 mg/dL (ref 0.60–1.30)
Glucose: 90 mg/dL (ref 65–99)
POTASSIUM: 4.8 mmol/L (ref 3.5–5.1)
Sodium: 141 mmol/L (ref 136–145)

## 2014-01-27 LAB — TROPONIN I: TROPONIN-I: 0.02 ng/mL

## 2014-01-28 ENCOUNTER — Ambulatory Visit (INDEPENDENT_AMBULATORY_CARE_PROVIDER_SITE_OTHER): Payer: 59 | Admitting: Cardiovascular Disease

## 2014-01-28 ENCOUNTER — Telehealth: Payer: Self-pay | Admitting: Cardiovascular Disease

## 2014-01-28 ENCOUNTER — Encounter: Payer: Self-pay | Admitting: Cardiovascular Disease

## 2014-01-28 VITALS — BP 142/70 | HR 60 | Ht 71.0 in | Wt 250.8 lb

## 2014-01-28 DIAGNOSIS — R079 Chest pain, unspecified: Secondary | ICD-10-CM

## 2014-01-28 DIAGNOSIS — E785 Hyperlipidemia, unspecified: Secondary | ICD-10-CM

## 2014-01-28 DIAGNOSIS — I10 Essential (primary) hypertension: Secondary | ICD-10-CM

## 2014-01-28 DIAGNOSIS — I2510 Atherosclerotic heart disease of native coronary artery without angina pectoris: Secondary | ICD-10-CM

## 2014-01-28 DIAGNOSIS — E1159 Type 2 diabetes mellitus with other circulatory complications: Secondary | ICD-10-CM

## 2014-01-28 DIAGNOSIS — I480 Paroxysmal atrial fibrillation: Secondary | ICD-10-CM

## 2014-01-28 MED ORDER — NITROGLYCERIN 0.4 MG SL SUBL: 0.4000 mg | SUBLINGUAL_TABLET | SUBLINGUAL | Status: DC | PRN

## 2014-01-28 MED ORDER — OMEPRAZOLE 20 MG PO CPDR: 20.0000 mg | DELAYED_RELEASE_CAPSULE | Freq: Two times a day (BID) | ORAL | Status: DC

## 2014-01-28 NOTE — Patient Instructions (Addendum)
Your next appointment will be scheduled in our new office located at :  [address], [place]  [place], [place] [address]  For your epigastric pain,  Start omeprazole twice a day If no improvement, call the office  Please call us if you have new issues that need to be addressed before your next appt.  Your physician wants you to follow-up in: 6 months.  You will receive a reminder letter in the mail two months in advance. If you don't receive a letter, please call our office to schedule the follow-up appointment.

## 2014-01-28 NOTE — Assessment & Plan Note (Signed)
Most recent lipid panel not available. Goal LDL less than 70. Recommended that he stay on his Lipitor

## 2014-01-28 NOTE — Assessment & Plan Note (Signed)
We have encouraged continued exercise, careful diet management in an effort to lose weight. 

## 2014-01-28 NOTE — Assessment & Plan Note (Signed)
Rare episodes of palpitations. He reports 2 episodes lasting 30 minutes. He takes diltiazem and amiodarone when necessary. No further medication changes made

## 2014-01-28 NOTE — Assessment & Plan Note (Signed)
Currently with no symptoms of angina. No further workup at this time. Continue current medication regimen. 

## 2014-01-28 NOTE — Telephone Encounter (Signed)
Spoke w/ pt.  Advised him that we have a cancellation at 11:00. He is agreeable to coming in at that time.

## 2014-01-28 NOTE — Assessment & Plan Note (Signed)
Atypical chest pain.  No further workup at this time. Continue current medication regimen. Suggested he call if symptoms get worse especially with exertion

## 2014-01-28 NOTE — Telephone Encounter (Signed)
Pt was seen in ED last night and wanted to be seen today, states that doctor Rockey Situ said he would see him today. I offered the next apt available, he did not want it, he said doctor said today. Please call patient.

## 2014-01-28 NOTE — Progress Notes (Signed)
Patient ID: Douglas Hunter, male    DOB: [birth year], 58 y.o.   MRN: [id number]  HPI Comments: Douglas Hunter is a pleasant 10 -year-old gentleman with CAD, non-STEMI with admission to the hospital in late April 2012 with cardiac catheterization showing severe mid left circumflex disease, subtotally occluded, with collaterals from the RCA to the LAD circumflex, normal ejection fraction who represented to the hospital shortly later with chest pain.  started on medical management,  nitrates and ranexa for his symptoms. Likely sleep apnea but has not had a sleep study.  He has had numerous episodes of atrial flutter. He reports symptoms dating back into his 55s.  2 recent hospitalizations for atrial flutter, 08/11/2012 and recently on 01/11/2013. Both episodes required hospitalization, medications for cardioversion. He has had several episodes that broke on their own  Previous ablation by Dr. Rayann Heman. Ablation for typical atrial flutter, atrial fibrillation.  Several episodes of flutter following the ablation that resolved with a amiodarone. He currently takes amiodarone and diltiazem as needed for palpitations  In followup today, he reports having recent episodes of chest pain over the past 4-5 months. Symptoms present at rest, typically at night when he is sitting. Reports a discomfort in his xiphoid area. Previous anginal symptoms were upper mediastinal region, he has not had any symptoms there. Spells will last for a short period of time, usually improves with walking. Typically walking is not make his symptoms worse, activity in general we'll not bring on symptoms.  Wife encouraged him to go to the emergency room he for evaluation. He went to the emergency room yesterday  Cardiac enzymes negative, basic metabolic panel negative, CBC with white count 11, chest x-ray normal EKG unchanged   [facility] showed total cholesterol 112, LDL 49, TSH 2.28   Prior Echocardiogram shows normal LV  systolic function, essentially normal study, May 2013  LDL 81, hemoglobin A1c 8.1, total cholesterol 144, HDL 50  EKG today shows normal sinus rhythm with  rate 60 beats per minute, right bundle branch block  Outpatient Encounter Prescriptions as of 01/28/2014  Medication Sig  . amiodarone (PACERONE) 200 MG tablet Take 200 mg by mouth as needed.  Marland Kitchen aspirin EC 81 MG tablet Take 81 mg by mouth daily.  Marland Kitchen atorvastatin (LIPITOR) 40 MG tablet Take 1 tablet (40 mg total) by mouth daily.  . benazepril (LOTENSIN) 20 MG tablet Take 20 mg by mouth daily.   Marland Kitchen diltiazem (CARDIZEM) 30 MG tablet Take 30 mg by mouth 4 (four) times daily as needed (heart rate increases).  . Exenatide (BYDUREON Volin) Inject into the skin once a week.  Marland Kitchen HUMULIN 70/30 (70-30) 100 UNIT/ML injection Inject 50-60 Units into the skin 2 (two) times daily with a meal. 60 units am and 50 units pm daily.  . isosorbide mononitrate (IMDUR) 30 MG 24 hr tablet TAKE 1 TABLET BY MOUTH DAILY  . metoprolol tartrate (LOPRESSOR) 25 MG tablet TAKE 1 TABLET BY MOUTH TWICE DAILY  . nitroGLYCERIN (NITROSTAT) 0.4 MG SL tablet Place 1 tablet (0.4 mg total) under the tongue every 5 (five) minutes as needed for chest pain.  Marland Kitchen VIAGRA 100 MG tablet TAKE 1 TABLET BY MOUTH AS NEEDED  . XARELTO 20 MG TABS tablet TAKE 1 TABLET BY MOUTH EVERY DAY    Review of Systems  Constitutional: Negative.   HENT: Negative.   Eyes: Negative.   Respiratory: Negative.   Cardiovascular: Positive for chest pain.  Gastrointestinal: Negative.   Endocrine: Negative.  Musculoskeletal: Negative.   Skin: Negative.   Allergic/Immunologic: Negative.   Neurological: Negative.   Hematological: Negative.   Psychiatric/Behavioral: Negative.   All other systems reviewed and are negative.  BP 142/70  Pulse 60  Ht 5\' 11"  (1.803 m)  Wt 250 lb 12 oz (113.739 kg)  BMI 34.99 kg/m2  Physical Exam  Nursing note and vitals reviewed. Constitutional: He is oriented to person,  place, and time. He appears well-developed and well-nourished.  HENT:  Head: Normocephalic.  Nose: Nose normal.  Mouth/Throat: Oropharynx is clear and moist.  Eyes: Conjunctivae are normal. Pupils are equal, round, and reactive to light.  Neck: Normal range of motion. Neck supple. No JVD present.  Cardiovascular: Normal rate, regular rhythm, S1 normal, S2 normal, normal heart sounds and intact distal pulses.  Exam reveals no gallop and no friction rub.   No murmur heard. Pulmonary/Chest: Effort normal and breath sounds normal. No respiratory distress. He has no wheezes. He has no rales. He exhibits no tenderness.  Abdominal: Soft. Bowel sounds are normal. He exhibits no distension. There is no tenderness.  Musculoskeletal: Normal range of motion. He exhibits no edema and no tenderness.  Lymphadenopathy:    He has no cervical adenopathy.  Neurological: He is alert and oriented to person, place, and time. Coordination normal.  Skin: Skin is warm and dry. No rash noted. No erythema.  Psychiatric: He has a normal mood and affect. His behavior is normal. Judgment and thought content normal.      Assessment and Plan

## 2014-01-28 NOTE — Assessment & Plan Note (Signed)
Stuttering atypical chest pain at rest over the past 4-5 months. Concerning for Gerd. Symptoms are different from his previous anginal symptoms. We will start omeprazole 20 mg twice a day. Suggested he call he office if symptoms do not improve

## 2014-01-29 ENCOUNTER — Ambulatory Visit: Payer: 59 | Admitting: Internal Medicine

## 2014-03-02 ENCOUNTER — Emergency Department: Payer: Self-pay | Admitting: Internal Medicine

## 2014-03-02 LAB — URINALYSIS, COMPLETE
Bacteria: NEGATIVE
Bilirubin,UR: NEGATIVE
Glucose,UR: 300 mg/dL (ref 0–75)
Ketone: NEGATIVE
LEUKOCYTE ESTERASE: NEGATIVE
Nitrite: NEGATIVE
Ph: 6 (ref 4.5–8.0)
Protein: NEGATIVE
Specific Gravity: 1.005 (ref 1.003–1.030)

## 2014-03-02 LAB — COMPREHENSIVE METABOLIC PANEL
ALBUMIN: 3.1 g/dL — AB (ref 3.4–5.0)
Alkaline Phosphatase: 140 U/L — ABNORMAL HIGH
Anion Gap: 9 (ref 7–16)
BILIRUBIN TOTAL: 0.6 mg/dL (ref 0.2–1.0)
BUN: 18 mg/dL (ref 7–18)
CALCIUM: 8.7 mg/dL (ref 8.5–10.1)
CO2: 24 mmol/L (ref 21–32)
Chloride: 98 mmol/L (ref 98–107)
Creatinine: 1.27 mg/dL (ref 0.60–1.30)
Glucose: 374 mg/dL — ABNORMAL HIGH (ref 65–99)
Osmolality: 280 (ref 275–301)
Potassium: 4.1 mmol/L (ref 3.5–5.1)
SGOT(AST): 30 U/L (ref 15–37)
SGPT (ALT): 45 U/L
Sodium: 131 mmol/L — ABNORMAL LOW (ref 136–145)
TOTAL PROTEIN: 7.8 g/dL (ref 6.4–8.2)

## 2014-03-02 LAB — CBC WITH DIFFERENTIAL/PLATELET
Basophil #: 0.1 10*3/uL (ref 0.0–0.1)
Basophil %: 0.8 %
Eosinophil #: 0.3 10*3/uL (ref 0.0–0.7)
Eosinophil %: 2.2 %
HCT: 41.7 % (ref 40.0–52.0)
HGB: 13.7 g/dL (ref 13.0–18.0)
LYMPHS PCT: 11 %
Lymphocyte #: 1.5 10*3/uL (ref 1.0–3.6)
MCH: 31.2 pg (ref 26.0–34.0)
MCHC: 32.7 g/dL (ref 32.0–36.0)
MCV: 95 fL (ref 80–100)
MONOS PCT: 10.1 %
Monocyte #: 1.3 x10 3/mm — ABNORMAL HIGH (ref 0.2–1.0)
NEUTROS PCT: 75.9 %
Neutrophil #: 10.1 10*3/uL — ABNORMAL HIGH (ref 1.4–6.5)
Platelet: 276 10*3/uL (ref 150–440)
RBC: 4.38 10*6/uL — AB (ref 4.40–5.90)
RDW: 12.8 % (ref 11.5–14.5)
WBC: 13.4 10*3/uL — AB (ref 3.8–10.6)

## 2014-03-13 ENCOUNTER — Encounter (HOSPITAL_COMMUNITY): Payer: Self-pay | Admitting: Internal Medicine

## 2014-03-17 ENCOUNTER — Ambulatory Visit: Payer: 59 | Admitting: Cardiovascular Disease

## 2014-04-17 ENCOUNTER — Encounter (HOSPITAL_COMMUNITY): Payer: Self-pay | Admitting: Internal Medicine

## 2014-04-23 ENCOUNTER — Other Ambulatory Visit: Payer: Self-pay | Admitting: Cardiovascular Disease

## 2014-04-25 ENCOUNTER — Ambulatory Visit: Payer: 59 | Admitting: Internal Medicine

## 2014-05-12 ENCOUNTER — Other Ambulatory Visit: Payer: Self-pay | Admitting: Cardiovascular Disease

## 2014-06-11 ENCOUNTER — Other Ambulatory Visit: Payer: Self-pay | Admitting: Cardiovascular Disease

## 2014-06-23 ENCOUNTER — Encounter: Payer: Self-pay | Admitting: Internal Medicine

## 2014-06-23 ENCOUNTER — Ambulatory Visit (INDEPENDENT_AMBULATORY_CARE_PROVIDER_SITE_OTHER): Payer: 59 | Admitting: Internal Medicine

## 2014-06-23 ENCOUNTER — Other Ambulatory Visit: Payer: Self-pay

## 2014-06-23 VITALS — BP 122/60 | HR 61 | Ht 71.0 in | Wt 244.6 lb

## 2014-06-23 DIAGNOSIS — I480 Paroxysmal atrial fibrillation: Secondary | ICD-10-CM

## 2014-06-23 DIAGNOSIS — I10 Essential (primary) hypertension: Secondary | ICD-10-CM

## 2014-06-23 DIAGNOSIS — I2510 Atherosclerotic heart disease of native coronary artery without angina pectoris: Secondary | ICD-10-CM

## 2014-06-23 DIAGNOSIS — I4891 Unspecified atrial fibrillation: Secondary | ICD-10-CM

## 2014-06-23 NOTE — Progress Notes (Signed)
Electrophysiology Office Note   Date:  06/23/2014   ID:  Douglas Hunter, DOB Nov 16, 1955, MRN [id number]  PCP:  Golden Pop, MD  Cardiologist:  Dr Rockey Situ Primary Electrophysiologist: Thompson Grayer, MD    Chief Complaint  Patient presents with  . Follow-up    AFIB     History of Present Illness: Douglas Hunter is a 59 y.o. male who presents today for electrophysiology evaluation.   He is doing very well.  He has rare palpitations lasting <30 minutes (only twice since I saw him last).  He is not sure if this is afib or other.  He does not take amiodarone but will take one if he has palpitations.  He is pleased with is current health state.  Today, he denies symptoms of  chest pain, shortness of breath, orthopnea, PND, lower extremity edema, claudication, dizziness, presyncope, syncope, bleeding, or neurologic sequela. The patient is tolerating medications without difficulties and is otherwise without complaint today.    Past Medical History  Diagnosis Date  . Hypertension   . Diabetes mellitus   . Hyperlipidemia   . Coronary artery disease 4/12    NSTEMI, occluded RCA with collaterals, no intervention required  . Atrial flutter   . Paroxysmal atrial fibrillation   . RBBB   . GERD (gastroesophageal reflux disease)    Past Surgical History  Procedure Laterality Date  . Cardiac catheterization  08/03/2011  . Tee without cardioversion N/A 01/28/2013    Procedure: TRANSESOPHAGEAL ECHOCARDIOGRAM (TEE);  Surgeon: Lelon Perla, MD;  Location: [facility] ENDOSCOPY;  Service: Cardiovascular;  Laterality: N/A;  . Ablation  01/29/2013    PVI and CTI by Dr Rayann Heman for atrial flutter and paroxysmal atrial fibrillation  . Atrial fibrillation ablation N/A 01/29/2013    Procedure: ATRIAL FIBRILLATION ABLATION;  Surgeon: Coralyn Mark, MD;  Location: [facility] CATH LAB;  Service: Cardiovascular;  Laterality: N/A;     Current Outpatient Prescriptions  Medication Sig Dispense  Refill  . amiodarone (PACERONE) 200 MG tablet Take 200 mg by mouth daily as needed (AFIB).     Marland Kitchen aspirin EC 81 MG tablet Take 81 mg by mouth daily.    Marland Kitchen atorvastatin (LIPITOR) 40 MG tablet Take 1 tablet (40 mg total) by mouth daily. 30 tablet 6  . benazepril (LOTENSIN) 20 MG tablet Take 20 mg by mouth daily.     Marland Kitchen diltiazem (CARDIZEM) 30 MG tablet Take 30 mg by mouth 4 (four) times daily as needed (heart rate increases).    Marland Kitchen HUMULIN 70/30 (70-30) 100 UNIT/ML injection Inject 50-60 Units into the skin 2 (two) times daily with a meal. 60 units am and 50 units pm daily.    . isosorbide mononitrate (IMDUR) 30 MG 24 hr tablet TAKE ONE TABLET BY MOUTH EVERY DAY 30 tablet 6  . metoprolol tartrate (LOPRESSOR) 25 MG tablet TAKE 1 TABLET BY MOUTH TWICE DAILY 180 tablet 3  . nitroGLYCERIN (NITROSTAT) 0.4 MG SL tablet Place 1 tablet (0.4 mg total) under the tongue every 5 (five) minutes as needed for chest pain. (Patient taking differently: Place 0.4 mg under the tongue every 5 (five) minutes as needed for chest pain (MAX 3 TABLETS). ) 25 tablet 6  . omeprazole (PRILOSEC) 20 MG capsule Take 1 capsule (20 mg total) by mouth 2 (two) times daily before a meal. 60 capsule 11  . VIAGRA 100 MG tablet TAKE 1 TABLET BY MOUTH EVERY DAY AS NEEDED (Patient taking differently: TAKE 1 TABLET BY MOUTH EVERY  DAY AS NEEDED (ED)) 10 tablet 1  . XARELTO 20 MG TABS tablet TAKE 1 TABLET BY MOUTH EVERY DAY. 30 tablet 3   No current facility-administered medications for this visit.    Allergies:   No known allergies   Social History:  The patient  reports that he quit smoking about 6 years ago. His smoking use included Cigarettes. He has a 52.5 pack-year smoking history. He does not have any smokeless tobacco history on file. He reports that he drinks alcohol. He reports that he does not use illicit drugs.   Family History:  The patient's  family history includes Heart attack in his maternal uncle; Heart attack (age of onset:  53) in his father.    ROS:  Please see the history of present illness.   All other systems are reviewed and negative.    PHYSICAL EXAM: VS:  BP 122/60 mmHg  Pulse 61  Ht 5\' 11"  (1.803 m)  Wt 244 lb 9.6 oz (110.95 kg)  BMI 34.13 kg/m2 , BMI Body mass index is 34.13 kg/(m^2). GEN: Well nourished, well developed, in no acute distress HEENT: normal Neck: no JVD, carotid bruits, or masses Cardiac: RRR; no murmurs, rubs, or gallops,no edema  Respiratory:  clear to auscultation bilaterally, normal work of breathing GI: soft, nontender, nondistended, + BS MS: no deformity or atrophy Skin: warm and dry  Neuro:  Strength and sensation are intact Psych: euthymic mood, full affect  EKG:  EKG is ordered today. The ekg ordered today shows sinus rhythm with PR 146, RBBB, Qtc 432 (unchanged from prior ekg)   Recent Labs: No results found for requested labs within last 365 days.    Lipid Panel  No results found for: CHOL, TRIG, HDL, CHOLHDL, VLDL, LDLCALC, LDLDIRECT   Wt Readings from Last 3 Encounters:  06/23/14 244 lb 9.6 oz (110.95 kg)  01/28/14 250 lb 12 oz (113.739 kg)  08/07/13 258 lb 3.2 oz (117.119 kg)      Other studies Reviewed: Additional studies/ records that were reviewed today include: Dr Gwenyth Ober notes    ASSESSMENT AND PLAN:   1. afib Doing well s/p ablation off of AADs chads2vasc score is at least 3.  We will plan to continue anticoagulation long term.  I offered implantable loop recorder today to further evaluate his palpitations post ablation.  He is clear in his decision to decline at this time. Should he have additional palpitations he may consider ILR implantation.  I would not recommend 30 day monitoring for this patient post ablation.  2. CAD No ischemic symptoms Could consider stopping ASA as he will require long term oral anticoagulation with xarelto.  I will defer to Dr Rockey Situ.  3. HTN Stable No change required today  Return for EP follow-up in  40months  Current medicines are reviewed at length with the patient today.   The patient does not have concerns regarding his medicines.  The following changes were made today:  none  Follow-up with Dr Rockey Situ as scheduled, I will see again in 1 year  Signed, Thompson Grayer, MD  06/23/2014 9:48 AM     [facility] [address] [place] [address] [phone] (office) [phone] (fax)

## 2014-06-23 NOTE — Patient Instructions (Addendum)
Your physician recommends that you continue on your current medications as directed. Please refer to the Current Medication list given to you today.  Your physician wants you to follow-up in: 12 months with Dr. Rayann Heman. You will receive a reminder letter in the mail two months in advance. If you don't receive a letter, please call our office to schedule the follow-up appointment.

## 2014-07-25 NOTE — Consult Note (Signed)
General Aspect atrial flutter with RVR   Present Illness 59 year-old gentleman with diabetes presents to the ER with tachypalpitations. He awoke at 4am today with a feeling that his heart was pounding. He's had similar episodes in past, last about one month ago. Episodes have always terminated on their own within 1-2 hours. On arrival to ER, heart rate was 145 bpm with RBBB-morphology QRS. He was given adenosine 6 and 12 mg demonstrating underlying atrial flutter. He remains tachycardic on my evaluation.   He otherwise has been in his normal state of health. Denies chest pain, pressure, dyspnea, edema, lightheadedness, or syncope. No recent heavy Etoh use, drinks on occasion but only one drink. Drinks moderate caffeine (1 coffee, 1-2 sodas). No recent illness and specifically denies cough, fever, chills, or GI symptoms.  PMHx includes: 1. CAD s/p NSTEMI 2013. Subtotal LCx occlusion with collaterals, managed medically. LVEF preserved 60%. 2. Type 2 DM - on insulin 70/30 3. HTN 4. Hyperlipidemia 5. GERD  Social History: Married, [place], nonsmoker, rare Etoh, works at [employer].  Family history: mother has pacemaker, no premature CAD or MI in family   Physical Exam:  GEN well developed, well nourished, no acute distress   HEENT pink conjunctivae, PERRL, Oropharynx clear   NECK supple  No masses   RESP normal resp effort  clear BS  no use of accessory muscles   CARD Tachycardic  Normal, S1, S2  No murmur  regular tachy   ABD denies tenderness  no liver/spleen enlargement  no hernia  soft  normal BS  no Abdominal Bruits   LYMPH negative neck   EXTR negative cyanosis/clubbing, negative edema   SKIN normal to palpation, No rashes, skin turgor good   NEURO cranial nerves intact, follows commands, motor/sensory function intact   PSYCH A+O to time, place, person, good insight   Review of Systems:  Subjective/Chief Complaint as per HPI    General: No Complaints   Skin: No Complaints   ENT: No Complaints   Eyes: No Complaints   Neck: No Complaints   Respiratory: No Complaints   Cardiovascular: Palpitations  as per HPI   Gastrointestinal: No Complaints   Genitourinary: No Complaints   Vascular: No Complaints   Musculoskeletal: No Complaints   Neurologic: No Complaints   Hematologic: No Complaints   Endocrine: No Complaints   Psychiatric: No Complaints   Review of Systems: All other systems were reviewed and found to be negative   Medications/Allergies Reviewed Medications/Allergies reviewed     Hypercholesterolemia:    Diabetes:    Hypertension:   Home Medications: Medication Instructions Status  benazepril 20 mg oral tablet 1 tab(s) orally once a day. Active  Aspirin Enteric Coated 81 mg oral delayed release tablet 1 tab(s) orally once a day Active  atorvastatin 40 mg oral tablet 1 tab(s) orally once a day Active  clopidogrel 75 mg oral tablet 1 tab(s) orally once a day Active  isosorbide mononitrate 30 mg oral tablet, extended release 1 tab(s) orally once a day. Active  Metoprolol Tartrate 25 mg oral tablet 0.5 tab (12.5mg ) orally 2 times a day. Active  HumuLIN 70/30 human recombinant 70 units-30 units/mL subcutaneous suspension 60 unit(s) subcutaneous once a day (in the morning) and 50 units subcutaneous once a day (in the evening). Active  Viagra 100 mg oral tablet 1 tab(s) orally once a day as needed. Active   EKG:  EKG Interp. by me   Interpretation atrial flutter  with RVR 145 bpm, RBBB    No Known Allergies:    Impression 1. Atrial flutter with RVR. Clear flutter waves after adenosine administration (I have reviewed tracings). CHADS-Vasc = 3 (HTN, DM, CAD) so he meets criteria for anticoagulation. Recommend diltiazem bolus and drip. Hopefully he will convert on diltiazem over next 24 hours. If not, would be reasonable to consider a bolus of IV amio in the am since we know onset of  AF was at 0400 today (would be within 48 hour window). Anticoagulate with IV heparin and will consider a novel anticoagulant at discharge. If he does not convert spontaneously, consider cardioversion Monday. Will follow-up in AM. Appreciate hospitalist care and management.  2. Elevated cardiac markers. Suspect Type 2 NSTEMI (demand ischemia) with known LCx occlusion and collaterals and elvated ventricular rate. Treat AFib and lower heart rate. Anticoagulate with heparin. As long as enzyme trend is flat, would manage medically. If he has large enzyme rise with peak and trough pattern will need cardiac cath, but seem to have minimal symptoms. Continue metoprolol but increase dose to 25 mg BID.  3. Type 2 DM - management per hospitalist team  4. Dyslipidemia - continue statin drug.  Will follow closely with you, thx.  3.   Electronic Signatures: Sherren Mocha (MD)  (Signed 10-May-14 12:35)  Authored: General Aspect/Present Illness, History and Physical Exam, Review of System, Past Medical History, Home Medications, EKG , Allergies, Impression/Plan   Last Updated: 10-May-14 12:35 by Sherren Mocha (MD)

## 2014-07-25 NOTE — Consult Note (Signed)
General Aspect Douglas Hunter is a 59yo male w/ PMHx s/f CAD, paroxysmal atrial flutter, RBBB, DM2, HTN, HLD, obesity and GERD who was admitted to [facility] today for tachy-palpitations.   He has a history of NSTEMI in 07/2010 s/p cardiac catheterization revealing LCx CTO w/ RCA/LAD collateralization. Angina was treated with BB, nitrates and ranolazine. He re-presented 08/2012 with tachycardia. He was administered adenosine, underlying flutter waves were appreciated. He was rate-controlled on diltiazem IV and heparinized. He converted overnight and was started on Xarelto. BB therapy was up-titrated. Note, there was a mild troponin plateau due to demand ischemia. No further intervention planned. He followed up w/ Dr. Rockey Situ post-discharge. Echo revealed EF 55-65%, mild LA dilatation. He was deemed stable overall.  He awoke at 0330 this AM w/ tachy-palpitations and associated lightheadedness similar to prior episodes of atrial flutter. He took his morning metoprolol and PRN diltiazem w/o relief. Denies cp, SOB/DOE, PND, orthopnea, LE edema or syncope. He did have an episode 2 months ago which broke with diltiazem PO. His wife does indicate he snores and gasps for breath at night. He has not had a sleep study. No further angina. He continues to take Xarelto as prescribed. No abnormal bleeding.   Present Illness In the ED, EKG reveals 2:1 atrial flutter, 140 bpm, then atrial fib 88 bpm. TnI 0.63. BMP and CBC largely WNL. CXR indicates no acute process.   PMHx includes: 1. CAD s/p NSTEMI 2013. Subtotal LCx occlusion with collaterals, managed medically. LVEF preserved 60%. 2. Poorly controlled type 2 DM - on insulin 70/30. Hgb A1C 8.1% checked this week.  3. HTN 4. Hyperlipidemia 5. 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  Family history: mother has pacemaker, no premature CAD or MI in family    Physical Exam:  GEN no acute distress, obese   HEENT pink conjunctivae, hearing intact to voice   NECK supple  No masses  thyroid tender  no JVD or bruits   RESP normal resp effort  clear BS  no use of accessory muscles  no wheezes, rales or rhonchi   CARD Regular rate and rhythm  Bradycardic  Normal, S1, S2  No murmur   ABD denies tenderness  soft  normal BS  central adiposity   EXTR negative cyanosis/clubbing, negative edema   SKIN normal to palpation, skin turgor good   NEURO follows commands, motor/sensory function intact   PSYCH alert, A+O to time, place, person   Review of Systems:  Subjective/Chief Complaint tachy-palpitations, lightheadedness   Cardiovascular: Palpitations   Neurologic: Dizzness   Review of Systems: All other systems were reviewed and found to be negative   Home Medications: Medication Instructions Status  benazepril 20 mg oral tablet 1 tab(s) orally once a day. Active  Aspirin Enteric Coated 81 mg oral delayed release tablet 1 tab(s) orally once a day Active  atorvastatin 40 mg oral tablet 1 tab(s) orally once a day Active  isosorbide mononitrate 30 mg oral tablet, extended release 1 tab(s) orally once a day. Active  HumuLIN 70/30 human recombinant 70 units-30 units/mL subcutaneous suspension 62 unit(s) subcutaneous once a day (in the morning) and 52 units subcutaneous once a day (in the evening). Active  Viagra 100 mg oral tablet 1 tab(s) orally once a day as needed. Active  Xarelto 20 mg oral tablet 1 tab(s) orally once a day (in the evening) Active  metoprolol 25  mg oral tablet 1 tab(s) orally in the morning and 2 tabs in the evening  Active  diltiazem 30 mg oral tablet 1 tab(s) orally once a day, As Needed Active   Lab Results:  Routine Chem:  10-Oct-14 06:51   Result Comment TROPONIN - RESULTS VERIFIED BY REPEAT TESTING.  - NOTIFIED OF CRITICAL VALUE  - C/ERICA COLLINS 01/11/13 AT 0752 BY HS  - READ-BACK PROCESS PERFORMED.   Result(s) reported on 11 Jan 2013 at 07:56AM.  Glucose, Serum  229  BUN 15  Creatinine (comp) 1.18  Sodium, Serum 136  Potassium, Serum 4.3  Chloride, Serum 104  CO2, Serum 26  Calcium (Total), Serum 8.8  Anion Gap  6  Osmolality (calc) 280  eGFR (African American) >60  eGFR (Non-African American) >60 (eGFR values <93m/min/1.73 m2 may be an indication of chronic kidney disease (CKD). Calculated eGFR is useful in patients with stable renal function. The eGFR calculation will not be reliable in acutely ill patients when serum creatinine is changing rapidly. It is not useful in  patients on dialysis. The eGFR calculation may not be applicable to patients at the low and high extremes of body sizes, pregnant women, and vegetarians.)  Cardiac:  10-Oct-14 06:51   Troponin I  0.63 (0.00-0.05 0.05 ng/mL or less: NEGATIVE  Repeat testing in 3-6 hrs  if clinically indicated. >0.05 ng/mL: POTENTIAL  MYOCARDIAL INJURY. Repeat  testing in 3-6 hrs if  clinically indicated. NOTE: An increase or decrease  of 30% or more on serial  testing suggests a  clinically important change)  CK, Total  396  CPK-MB, Serum  11.2 (Result(s) reported on 11 Jan 2013 at 07:40AM.)  Routine Hem:  10-Oct-14 06:51   WBC (CBC) 9.0  RBC (CBC) 4.83  Hemoglobin (CBC) 15.5  Hematocrit (CBC) 44.8  Platelet Count (CBC) 238 (Result(s) reported on 11 Jan 2013 at 07:13AM.)  MCV 93  MCH 32.1  MCHC 34.6  RDW 13.2   EKG:  Interpretation 1) atrial flutter, 140 bpm 2:1 conduction, RBBB, LAD, diffuse upsloping ST depressions 2) atrial fibrillation, 88 bpm, LAD, RBBB, isoelectric STs   Radiology Results: XRay:    10-Oct-14 07:10, Chest Portable Single View  Chest Portable Single View   REASON FOR EXAM:    Chest Pain  COMMENTS:       PROCEDURE: DXR - DXR PORTABLE CHEST SINGLE VIEW  - Jan 11 2013  7:10AM     RESULT: Comparison is made to the study of 08/11/2012. The lungs are   clear. The heart and pulmonary  vessels are normal. The bony and   mediastinal structures are unremarkable. There is no effusion. There is   no pneumothorax or evidence of congestive failure.    IMPRESSION:  No acute cardiopulmonary disease. Stable appearance.    Dictation Site: 2      Verified By: GSundra Aland M.D., MD    No Known Allergies:    Impression 89yomale w/ PMHx s/f CAD, paroxysmal atrial flutter, RBBB, DM2, HTN, HLD, obesity and GERD [facility].   1. PAF/flutter He reports a similar scenario to that in May of this year. He awoke in the early morning with tachy-palpitations and lightheadedness c/w prior episodes of atrial flutter. Rhythm persisted despite PRN diltiazem PO this AM. Mild LA dilatation on echo in May. He is obese and does snore/gasp for breath at night. Given the consistent timing of these episodes, suspect occult OSA triggering while asleep. The patient  has converted back to NSR after successive diltiazem boluses. HR 50s currently (normally 70s). EKGs indicate both atrial flutter & fib. He has had good response to diltiazem twice now.  -- Favor pursuing rate-control strategy- add long-acting diltiazem, could increase metoprolol to 50 BID (hold for HR < 60)- give PM dose -- He needs a sleep study +/- CPAP for likely OSA. Check nocturnal O2 sat this admission. -- Advised on dietary adjustments and weight loss to treat obesity (well-established cause of atrial arrhythmias) and control DM2 -- If persists beyond these recommendations, consider RFA in the future -- Continue Xarelto -- Could update echo  2. CAD/NSTEMI Suspect type 2 NSTEMI. He has a LCx CTO at baseline. Denies angina or dyspnea. EKG w/ nonspecific ST changes.  -- Trend CEs, if significant elevation, pursue ischemic work-up.   3. RBBB Chronic.   4. HTN Normotensive.  -- Continue current antihypertensives.   5. HLD Check lipid panel in AM.  -- Continue statin.   6. DM2 Hgb  A1C 8.1%. Poorly controlled.  -- Diet, weight loss stressed -- Management per primary team   Electronic Signatures for Addendum Section:  Kathlyn Sacramento (MD) (Signed Addendum 10-Oct-14 18:46)  The patient was seen and examined. Agree with the above. He had tachcyardia associated with chest tightness. Similar presentation in May but TnI was not elevated to this degree. He has a known systolic murmur by exam. Initial EKG is likely atrial flutter.  Due to known CAD and diabetes, I favor proceeding with cardiac cath to evaluate NSTEMI. TnI elevation could be due to supply demand ischemia but it seems to be higher than expected.  He will likely require an antiarrhythmic medication or ablation.   Electronic Signatures: Danella Sensing A (PA-C)  (Signed 10-Oct-14 10:52)  Authored: General Aspect/Present Illness, History and Physical Exam, Review of System, Home Medications, Labs, EKG , Radiology, Allergies, Impression/Plan Kathlyn Sacramento (MD)  (Signed 10-Oct-14 18:46)  Co-Signer: General Aspect/Present Illness, Home Medications, Labs, Radiology, Allergies   Last Updated: 10-Oct-14 18:46 by Kathlyn Sacramento (MD)

## 2014-07-25 NOTE — H&P (Signed)
PATIENT NAME:  Douglas Hunter, Douglas Hunter MR#:  [id number] DATE OF BIRTH:  March 13, 1956  DATE OF ADMISSION:  01/11/2013  PRIMARY CARE PHYSICIAN:  Dr. Jeananne Rama.   CARDIOLOGIST:  Dr. Rockey Situ.   CHIEF COMPLAINT:  Palpitations.   HISTORY OF PRESENT ILLNESS:  This is a 59 year old man with a history of supraventricular tachycardia, atrial fibrillation. He presents to the ER with palpitations, which woke him up at 3:30 in the morning, radiated up into his neck. He took one of his p.r.n. diltiazem without relief, ended up coming to the Emergency Room, having a heart rate in the 140s on EKG. He received pushes of diltiazem 10 mg, 20 mg and 25 mg. The first EKG was SVT. The EKG was atrial fibrillation and then he converted to normal sinus rhythm. Hospitalist services were contacted for further evaluation. Troponin was borderline at 0.63. The patient with no complaints of chest pain or shortness of breath.   PAST MEDICAL HISTORY:  SVT, atrial fibrillation, diabetes, hypertension, coronary artery disease, hyperlipidemia.   PAST SURGICAL HISTORY:  No operations.   ALLERGIES:  No known drug allergies.   MEDICATIONS:  As per prescription writer include:  Aspirin 81 mg daily, atorvastatin 40 mg daily, benazepril 20 mg daily, diltiazem 30 mg once a day as needed, Humulin 70/30, 62 units in the a.m., 52 units in the p.m., Imdur 30 mg daily, metoprolol tartrate 25 mg 1 tablet in the morning, 2 tablets in the evenin-0 chromic Viagra 100 mg daily, Xarelto 20 mg in the evening.   SOCIAL HISTORY:  No smoking. Does drink a can of beer per day. No drug use. Does drink caffeinated beverages. He works for a [employer].   FAMILY HISTORY:  Father died at 41 of an MI. Mother died at 18 of a CVA.    REVIEW OF SYSTEMS: CONSTITUTIONAL:  No fever, chills, or sweats. Positive for weight gain. No weakness or fatigue.  EYES:  He does wear glasses.  EARS, NOSE, MOUTH, AND THROAT:  No hearing loss. No sore throat. No difficulty  swallowing.  CARDIOVASCULAR:  No chest pain. Positive for palpitations.  RESPIRATORY:  No shortness of breath. Positive for cough. No sputum. No hemoptysis.  GASTROINTESTINAL:  No nausea. No vomiting. No abdominal pain. No diarrhea. No constipation. No bright-red blood per rectum. No melena.  GENITOURINARY:  No burning on urination. No hematuria.  MUSCULOSKELETAL:  Positive for back pain, which he sees a chiropractor for.  INTEGUMENT:  No rashes or eruptions.  NEUROLOGIC:  No fainting or blackouts.  PSYCHIATRIC:  No anxiety or depression.  ENDOCRINE:  No thyroid problems.  HEMATOLOGIC AND LYMPHATIC:  No anemia, no easy bruising or bleeding.   PHYSICAL EXAMINATION:  VITAL SIGNS:  Temperature 98.3, pulse 56, respirations 16, blood pressure 120/58, pulse ox 98% on room air.  GENERAL:  No respiratory distress.  EYES:  Conjunctivae and lids normal. Pupils equal, round, and reactive to light. Extraocular muscles intact. No nystagmus.  EARS, NOSE, MOUTH, AND THROAT:  Tympanic membranes:  No erythema. Nasal mucosa:  No erythema. Throat:  No erythema, no exudate seen. Lips and Gums:  No lesions.  NECK:  No JVD. No bruits. No lymphadenopathy. No thyromegaly. No thyroid nodules palpated.  LUNGS:  Clear to auscultation. No use of accessory muscles to breathe. No rhonchi, rales, or wheeze heard.  CARDIOVASCULAR:  S1, S2 normal. No gallops, rubs or murmurs heard. Carotid upstroke 2+ bilaterally. No bruits.  EXTREMITIES:  Dorsalis pedis pulses 2+ bilaterally. No edema of the  lower extremity.  ABDOMEN:  Soft, nontender. No organosplenomegaly. Normoactive bowel sounds. No masses felt.  LYMPHATIC:  No lymph nodes in the neck.  MUSCULOSKELETAL:  No clubbing, edema or cyanosis.  SKIN:  No rashes or ulcers seen.  NEUROLOGIC:  Cranial nerves II through XII grossly intact. Deep tendon reflexes 2+ bilateral lower extremities.  PSYCHIATRIC:  Oriented to person, place and time.   LABORATORY AND RADIOLOGICAL DATA:   A chest x-ray showed no acute cardiopulmonary disease. White blood cell count 9.0, H and H 15.5 and 44.8, platelet count of 238. Glucose 229, BUN 15, creatinine 1.18, sodium 135, potassium 4.3, chloride 104, CO2 26, calcium 8.8. Troponin borderline at 0.63.   ASSESSMENT AND PLAN:  1.  Supraventricular tachycardia, atrial fibrillation with a rapid heart rate. We will admit as an observation since the patient converted to normal sinus rhythm, have Cardiology adjust medications. At this point, I will increase metoprolol to 50 mg b.i.d. and p.r.n. Cardizem. Cardiology can consider other treatment options but we will have them eval.  2.  Elevated troponin with a history of coronary artery disease: This is likely rate-related. We will check enzymes and telemetry.  3.  Diabetes. We will keep on patient's usual medications.  4.  Hypertension. The patient's blood pressure is stable. Watch closely with the increase in metoprolol.  5.  Hyperlipidemia. We will check a lipid profile in the morning. Continue the statin.  TIME SPENT ON ADMISSION:  55 minutes.  ____________________________ Tana Conch. Leslye Peer, MD rjw:jm D: 01/11/2013 16:06:55 ET T: 01/11/2013 16:48:51 ET JOB#: [id number]  cc: Tana Conch. Leslye Peer, MD, <Dictator> Guadalupe Maple, MD Minna Merritts, MD Marisue Brooklyn MD ELECTRONICALLY SIGNED 01/21/2013 10:15

## 2014-07-25 NOTE — Discharge Summary (Signed)
PATIENT NAME:  Douglas Hunter, GOYNE MR#:  [id number] DATE OF BIRTH:  07-19-1955  DATE OF ADMISSION:  08/11/2012 DATE OF DISCHARGE:  08/12/2012  For a detailed note, please see the history and physical done on admission by Dr. Laurin Coder.   DIAGNOSES AT DISCHARGE: Are as follows: Supraventricular tachycardia, likely atrial flutter, new-onset; diabetes, uncontrolled; hypertension, history of coronary artery disease,  hyperlipidemia.   DIET: The patient is being discharged on a low-sodium, low-fat, American Diabetic Association 1800-calorie diet.   ACTIVITY: As tolerated.   FOLLOWUP: With Dr. [name] in the next 1 week, also follow with  Dr. Golden Pop in the next 1 to 2 weeks.   DISCHARGE MEDICATIONS: Are: Benazepril 20 mg daily, aspirin 81 mg daily, atorvastatin 40 mg daily, Imdur 30 mg daily, metoprolol tartrate 12.5 mg b.i.d., Humulin 70/30, 60 units in the morning, 50 units in the evening, Viagra 100 mg as needed, and Xarelto 20 mg daily.   [place] COURSE: Dr. Sherren Mocha from [facility].   [place] COURSE: Chest x-ray done on admission showing no evidence of acute cardiopulmonary disease.   BRIEF HOSPITAL COURSE:  1.  This is a 59 year old male with medical problems as mentioned above, presented to the hospital with palpitations and noted to be in SVT and new-onset atrial flutter. SVT/atrial flutter: The patient presented with symptoms of palpitations and was noted to be in an irregular rhythm with atrial flutter. The patient was given pulse-doses of IV Cardizem in the ER, eventually placed on a Cardizem drip and admitted to the intensive care unit. He converted back to a sinus rhythm shortly after being on the Cardizem drip. He actually became a bit bradycardic, therefore he was not put on any oral rate-controlling meds. He already is on-low-dose metoprolol and he will continue that for rate-control for now. The patient's CHADS  VASc score was III, therefore he is at high risk for a stroke. He was initially started on a heparin drip, although he is currently being discharged on Xarelto. The patient was on Plavix prior to coming in, and that has now been discontinued. He will continue the aspirin and Xarelto for now. The patient will follow up with his cardiologist, Dr. Rockey Situ, outpatient next week.  2.  Elevated troponin: The patient had a mildly elevated troponin at 0.2. It did not any significantly higher. This was likely thought to be demand ischemia from his SVT, and no evidence of an acute coronary syndrome. The patient is currently chest pain-free and hemodynamically stable.  3.  Diabetes: His diabetes is not under good control. His hemoglobin A1c is around 8. His blood sugars were fairly high. He was given pulse-doses of sliding scale insulin coverage along with his 70/30 insulin. Further titrations or adjustments to his insulin can be done by his primary care physician as an outpatient.  4. Hypertension: The patient remained hemodynamically stable. He will continue his Imdur, metoprolol and benazepril, as stated.  5.  History of previous coronary artery disease: The patient had a cath in May of 2013 which showed some significant disease to the left circumflex, but had good collateral flow. The patient currently is chest pain-free. He will continue his aspirin, beta blocker and statin. He was taken off the Plavix, as he was started on Xarelto. Further followup of his coronary artery disease is with his cardiologist, Dr. Rockey Situ, as an outpatient.   THE PATIENT IS A FULL CODE.   Time spent on discharge is 40  minutes.    ____________________________ Belia Heman. Verdell Carmine, MD vjs:dm D: 08/12/2012 13:26:25 ET T: 08/13/2012 09:16:29 ET JOB#: [id number]  cc: Belia Heman. Verdell Carmine, MD, <Dictator> Minna Merritts, MD Guadalupe Maple, MD Henreitta Leber MD ELECTRONICALLY SIGNED 08/21/2012 19:57

## 2014-07-25 NOTE — Discharge Summary (Signed)
PATIENT NAME:  Douglas Hunter, Douglas Hunter MR#:  [id number] DATE OF BIRTH:  [birth year]  DATE OF ADMISSION:  01/11/2013 DATE OF DISCHARGE:  01/12/2013  ADMITTING DIAGNOSIS: Palpitations.   DISCHARGE DIAGNOSES:  1.  Palpitations due to paroxysmal atrial flutter as well as supraventricular tachycardia.  2.  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 He will need outpatient followup.  3.  Possible sleep apnea. Needs outpatient sleep study.  4.  Diabetes.  5.  Hypertension.  6.  Coronary artery disease.  7.  Hyperlipidemia.  8.  Previous history of supraventricular tachycardia.  9.  Previous history of atrial fibrillation.  CONSULTANTS:  [facility].   PERTINENT LABS AND EVALUATIONS: Admitting glucose 229, BUN 15, creatinine 1.18, sodium 136, potassium 4.3, chloride 104, CO2 was 26. His calcium was 8.8. CPK was 396. CK-MB was 11.2. Troponin was 0.63, then subsequently went to 1.40 and then 1.10. TSH 2.28. WBC 9.0, hemoglobin 15.5, platelet count was 238. INR was 1.1. Chest x-ray showed no acute abnormality. EKG initially showed atrial fibrillation also SVT on a previous EKG and then subsequently converted to normal sinus rhythm.   HOSPITAL COURSE: Please refer to H and P done by the admitting physician. The patient is a 59 year old white male with previous history of coronary artery disease with a severe left circumflex disease, who is being medically managed for this. He has a previous history of SVT and atrial fibrillation. He came to the ED with complaint of palpitations. He had some chest discomfort when he palpitations. When initially arrived to the ED, he was noted to have heart rate in the 140s. Initial EKG showed SVT. He received 10, 20 and 25 mg of diltiazem and then the next EKG showed atrial fibrillation and then he converted to normal sinus rhythm. Due to these  symptoms, we were asked to admit the patient for observation. He was also noted to have elevated cardiac enzymes. The patient was kept in the hospital. Cardiology consult was obtained by Dr. Fletcher Anon initially and then was seen by Dr. Burt Knack the next day. The patient was recommended to have outpatient EP study and possible ablation for his atrial flutter. He is already chronically anticoagulated, so that was continued. He also was noted to have elevated cardiac enzymes, but he has a known history of left circumflex severe blockage, so he was felt to have possible demand ischemia or type II non-ST MI per cardiology. They recommended medical management for this. He is currently doing much better. He has remained sinus rhythm throughout the night. Cardiology recommended starting the patient on Multaq, which he is started on. At this time, he is stable for discharge.   DISCHARGE MEDICATIONS: Benazepril 20 mg daily, aspirin 81 mg 1 tab p.o. daily, Atorvastatin 40 mg daily, isosorbide mononitrate 30 mg daily, Humulin 70/30, 62 units in the morning and 52 units in the evening, magnesium 100 mg 1 tab p.o. daily as needed, Xarelto 20 mg 1 tab p.o. daily, diltiazem 30 mg daily as needed, Multaq 400 mg 1 tab p.o. b.i.d. and metoprolol 25 mg 1 tab p.o. in the morning and 2 tabs in the evening.   DIET: Regular.   ACTIVITY: As tolerated.   DISCHARGE FOLLOWUP: Follow with Dr. Rockey Situ next week. Follow up with Dr. Raul Del in 2 to 4 weeks for possible outpatient sleep study.   NOTE: 45 minutes spent on the discharge.  ____________________________ Lafonda Mosses Posey Pronto, MD shp:aw D: 01/13/2013 08:27:36 ET T: 01/13/2013 09:10:20 ET JOB#: [id number]  cc: Taylor Levick H. Posey Pronto, MD, <Dictator> Alric Seton MD ELECTRONICALLY SIGNED 01/18/2013 18:28

## 2014-07-25 NOTE — H&P (Signed)
PATIENT NAME:  Douglas Hunter, Douglas Hunter MR#:  [id number] DATE OF BIRTH:  [birth year]  DATE OF ADMISSION:  08/11/2012  REFERRING PHYSICIAN: Conni Slipper, MD, and also cardiology, Juanda Bond. Burt Knack, MD, from [facility].   PRIMARY CARDIOLOGY: Minna Merritts, MD  PRIMARY CARE PHYSICIAN: Guadalupe Maple, MD  CHIEF COMPLAINT: Palpitations and tachycardia.   HISTORY OF PRESENT ILLNESS: Douglas Hunter is a very nice 59 year old gentleman who has history of multiple medical problems, including hypertension, insulin-dependent diabetes, hyperlipidemia, previous tobacco use and significant for coronary artery disease. The patient states that within the past 6 months, has had 4 big episodes of tachycardia and palpitations that happen especially in the middle of the night and wake him up. Fortunately, they go away after he starts breathing and relaxing, and sometimes just sitting down and taking deep breaths.  The patient states that he was seeing Dr. [name] 6 months ago, and these episodes started happening just after he had a followup with Dr. [name] to talk to him about this issue, but this morning around 4:00 a.m. in the morning, he woke up with tachycardia. His heart rate was speeding up significantly and has not stopped. He feels like his heart is pounding, and his previous episode was about 1 month ago and lasted for 1 to 2 hours, but this one has not resolved.   During the ER evaluation, he was found to be on significant SVT at 145 a minute. Adenosine was given 6, then 12, and then after that, it demonstrated that the patient was in underlying atrial flutter. The patient received also diltiazem, and he has not changed his rhythm, for which cardiology was consulted. Dr. Burt Knack discussed the case with me. He is going to put him in the CCU. He is planning on doing a TEE for cardioversion if the patient does not transform into normal sinus rhythm overnight. He is okay with using amiodarone if  necessary to try to transform the rhythm, but for now, he wants him to be on diltiazem. He wants to keep him anticoagulated as he has multiple risk factors for stroke.   REVIEW OF SYSTEMS: A 12-system review of systems is done. GENERAL: The patient denies any fever, weakness, fatigue, weight loss or weight gain.  EYES: No blurry vision, double vision.  ENT: No tinnitus. No snoring. No difficulty swallowing.  RESPIRATORY: No cough. No wheezing. No hemoptysis. He used to smoke, but he quit 6 years ago.  CARDIOVASCULAR: No chest pain. No orthopnea, no edema. Positive palpitations. Positive tachycardia. Positive arrhythmia. No syncope.  GASTROINTESTINAL: No nausea, vomiting, abdominal pain, constipation or diarrhea.  GENITOURINARY: No dysuria, hematuria or changes in frequency. No prostatitis.  ENDOCRINOLOGY: No polyuria, polydipsia or polyphagia. He has diabetes and has bad control. He does not check his blood sugars often. He denies any thyroid problems. No cold or heat intolerance.  HEMATOLOGIC AND LYMPHATIC: No anemia, easy bruising or bleeding. He takes aspirin and Plavix.  MUSCULOSKELETAL: No significant chest pain, back pain or arthritis. No gout.  NEUROLOGIC: No numbness, tingling. No CVAs. No TIAs.  PSYCHIATRIC: No anxiety, insomnia or nervousness.   PAST MEDICAL HISTORY:  1. Insulin-dependent diabetes.  2. Coronary artery disease with severe mid left circumflex disease and ejection fraction of 55%. 3. Diastolic dysfunction.  4. Former smoker.  5. Hyperlipidemia.  6. Hypertension.   ALLERGIES: No known drug allergies.   PAST SURGICAL HISTORY: Positive for stent placement due to subtotal left circumflex occlusion with collaterals.   SOCIAL  HISTORY: The patient is married and lives with his wife. He does not drink important amounts; very seldom, he drinks a beer. He used to smoke 2 packs a day for over 20 years, but he quit 6 years ago. He denies any drug use.   FAMILY HISTORY:  Positive for MI and a stroke in his mother, and there is significant history of coronary artery disease in his family. His mother also had a pacemaker.   CURRENT MEDICATIONS:  1. Viagra 100 mg once a day. The patient states that he has not taken Viagra within the last 72 hours.  2. Metoprolol tartrate 12.5 mg twice daily.  3. Isosorbide 1 once daily.  4. Humulin 70/30 takes 60 units in the morning and 50 units at night. 5. Plavix 75 mg daily.  6. Benazepril 20 mg daily.  7. Atorvastatin 40 mg daily.  8. Aspirin enteric-coated 81 mg daily.   PHYSICAL EXAMINATION:  VITAL SIGNS: Blood pressure 112/72, pulse 146, respiratory rate 20, temperature 97.8.  GENERAL: Alert and oriented x3, in no acute distress. No respiratory distress. Hemodynamically stable.  HEENT: Pupils are equal and reactive. Extraocular movements intact. Mucosa is moist. Anicteric sclerae. Pink conjunctivae. Normocephalic, atraumatic.  NECK: Supple. No JVD. No thyromegaly. No adenopathy. No masses. No bruits. No rigidity.  CARDIOVASCULAR: Tachycardic in the 140s, regular. No murmurs are appreciated. No rubs are appreciated. No displacement of PMI. No thrills.  LUNGS: Clear, without any wheezing or crepitus. No use of accessory muscles.  ABDOMEN: Soft, nontender, nondistended. No hepatosplenomegaly. No masses. Bowel sounds are positive.  GENITAL: Deferred.  EXTREMITIES: No edema, no cyanosis, no clubbing. Pulses +2. Capillary refill less than 3. Sensation is normal in 4 extremities.  NEUROLOGIC: Cranial nerves II through XII intact. Strength is 5 out of 5 in 4 extremities.  PSYCHIATRIC: Mood is normal, without any signs of significant agitation, anxiety or depression.  SKIN: Without any rashes or petechiae. MUSCULOSKELETAL: No significant joint effusions.  LYMPHATIC: No lymphadenopathy in neck or supraclavicular areas.  DIAGNOSTIC STUDIES: EKG: SVT, transformed into flutter after adenosine given, right now SVT in 140s. There  is some ST depression that is actually related to the tachycardia. Glucose is 143, BUN 18, creatinine 116, serum sodium 135, potassium 4.4. LFTs within normal limits. CK 194, troponin 0.21. White count 10.0, hemoglobin 14 and platelets 240. INR is 0.9.   ASSESSMENT AND PLAN: A 59 year old gentleman with history of coronary artery disease, diabetes, hypertension, previous smoking, he also has diastolic dysfunction, who comes with a history of tachycardia.   1. Tachycardia, supraventricular tachycardia/atrial flutter. Cardiology consulted already. The patient has multiple risk factors of stroke, including known coronary artery disease, hypertension, type 2 diabetes and diastolic dysfunction. He is only 27. The patient does not have peripheral vascular disease as we know. His CHADSVASC equals 3, for which he meets criteria for anticoagulation. The patient is going to be admitted to the CCU, put on the diltiazem drip to see if he converts overnight. If he does not convert overnight and his heart rate is still really high, consider amiodarone IV. If he does not transform by the morning, TEE and cardioversion will be the next step. Cardiology is going to follow up in the morning.  For his tachycardia evaluation, also I am going to get a magnesium level and a TSH.  2. Elevated cardiac markers. The patient has elevation of troponin which is likely due to the demand ischemia. He has known history of occlusion of the left circumflex,  and he has good collaterals. His ventricular function is normal for which, at this moment, we are going to continue anticoagulation with heparin and cycle cardiac enzymes. Again, this is likely to be demand ischemia, but we are going to rule out the possibility of acute coronary syndrome.  3. Coronary artery disease. Continue aspirin, Plavix and metoprolol. Continue statin. Also continue isosorbide for his coronary artery disease.  4. Type 2 diabetes. At this moment, the patient has  elevation of blood sugars. I am going to start him on insulin sliding scale. If his blood sugars continue to be so elevated, we are going to change his regular regimen of 70/30. For now, we are going to do 60 in p.m. and 50 in a.m. The patient is noncompliant.  5. Erectile dysfunction. The patient takes Viagra. I am going to hold on this medication for now. 6. Hypertension. Continue benazepril, hold if systolic blood pressure is below 90.  7. DVT prophylaxis with heparin.  8. Gastrointestinal prophylaxis with Protonix.   CODE STATUS: The patient is a full code.  TIME SPENT: I spent about 45 minutes with this patient.   ____________________________ [place] Sink, MD rsg:OSi D: 08/11/2012 12:51:30 ET T: 08/11/2012 13:21:07 ET JOB#: [id number]  cc: [place] Sink, MD, <Dictator> Malyiah Fellows America Brown MD ELECTRONICALLY SIGNED 08/13/2012 12:08

## 2014-07-27 NOTE — Discharge Summary (Signed)
PATIENT NAME:  Douglas Hunter, Douglas Hunter MR#:  [id number] DATE OF BIRTH:  06/02/1955  DATE OF ADMISSION:  08/10/2011 DATE OF DISCHARGE:  08/12/2011  DIAGNOSES:  1. Atypical chest pain, unclear etiology, likely noncardiac, possibly related to anxiety.  2. Coronary artery disease. 3. Hypertension.  4. Insulin-dependent diabetes.  5. Hyperlipidemia.   DISPOSITION: Patient is being discharged home.   FOLLOW UP: Follow up with Dr. Rockey Situ within one week. Follow up with Dr. Jeananne Rama in 1 to 2 weeks.   DIET: Low sodium, 1800 calorie ADA diet.   ACTIVITY: As tolerated.   DISCHARGE MEDICATIONS:  1. Ranexa 500 mg b.i.d.   2. Plavix 75 mg daily.  3. Lipitor 40 mg daily.  4. Benazepril 20 mg daily.  5. Imdur 30 mg daily.  6. Lopressor 12.5 mg b.i.d.  7. Aspirin 81 mg daily.  8. Humalog 75/25, 55 units in the morning and 50 units in the evening.   CONSULTATION: Cardiology consultation with Dr. Fletcher Anon.   LABORATORY, DIAGNOSTIC, AND RADIOLOGICAL DATA: Chest x-ray showed no acute abnormalities. Echo showed left ventricular systolic function is normal. Ejection fraction more than 55%. Mild concentric left ventricular hypertrophy, impaired LV relaxation. Cardiac enzymes negative. CBC essentially normal. BNP normal.   HOSPITAL COURSE: Patient is a 59 year old male with past medical history of coronary artery disease. Patient was recently admitted to [facility] for NSTEMI and was found to have complex severe mid left circumflex disease which was not stented. Patient was told that if he continued to have symptoms then he may require stenting of the lesion at [facility]. Patient was discharged home in stable condition. He returned with chest pain radiating to his left upper extremity. Given his recent MI  patient was admitted to the hospital and ruled out for acute coronary syndrome by three sets of negative cardiac enzymes. A cardiology consultation with Dr. Fletcher Anon was obtained who recommended repeating an echocardiogram  to rule out other causes of chest pain including pericarditis. Repeat echo was completely normal. Patient was chest pain free after his initial presentation. Dr. Fletcher Anon has added Ranexa to his medication regimen. The rest of his medical problems remained stable during the hospitalization and he is being discharged home in stable condition.   TIME SPENT: 45 minutes.   ____________________________ Cherre Huger, MD sp:cms D: 08/12/2011 15:42:51 ET T: 08/15/2011 11:14:37 ET JOB#: [id number]  cc: Cherre Huger, MD, <Dictator> Cherre Huger MD ELECTRONICALLY SIGNED 08/17/2011 13:04

## 2014-07-27 NOTE — Consult Note (Signed)
General Aspect 59 yo male with PMH of diabetes, HTN, hyperlipidemia, long smoking hx (stopped a few years ago) was recently hospitalized for non-ST elevation myocardial infarction. He underwent cardiac catheterization by Dr.Gollan which showed severe disease in the left circumflex with collaterals. Ejection fraction was normal. The lesion was high risk for intervention due to a steep angulation of the left circumflex coming from the left main coronary artery as well as heavy calcifications. The case was discussed during cath conference and the consensus was to recommend initial medical therapy. The patient went back to work this week. He continued to have chest pain described as an aching sensation substernally mostly at rest and does not worsen with physical activities. This lasts anywhere from 5-15 minutes and does not improve with nitroglycerin. He also has heartburn. his ECG did not show any acute changes and cardiac enzymes are negative so far.    Present Illness . Social:  Long smoking hx, stopped 4 to 5 years ago. No significant ETOH. Lives with his wife, works in a factory  Family Fx:  Parents had CAD, HTN   Physical Exam:   GEN well developed, well nourished, no acute distress    HEENT red conjunctivae    NECK supple  No masses    RESP normal resp effort  clear BS    CARD Regular rate and rhythm  Murmur    Murmur Systolic    ABD denies tenderness  soft    LYMPH negative neck    EXTR negative edema    SKIN normal to palpation    NEURO cranial nerves intact, motor/sensory function intact    PSYCH alert, A+O to time, place, person, good insight   Review of Systems:   Subjective/Chief Complaint Chest pain, now resolved    General: No Complaints    Skin: No Complaints    ENT: No Complaints    Eyes: No Complaints    Neck: No Complaints    Respiratory: No Complaints    Cardiovascular: Chest pain or discomfort    Gastrointestinal: No Complaints     Genitourinary: No Complaints    Vascular: No Complaints    Musculoskeletal: No Complaints    Neurologic: No Complaints    Hematologic: No Complaints    Endocrine: No Complaints    Psychiatric: No Complaints    Review of Systems: All other systems were reviewed and found to be negative    Medications/Allergies Reviewed Medications/Allergies reviewed   Home Medications: Medication Instructions Status  Humalog Mix 75/25 subcutaneous suspension 55 unit(s) in a.m and 50 unit(s) in p.m. subcutaneous  Active  Ecotrin Adult Low Strength 81 mg oral delayed release tablet 1 tab(s) orally once a day Active  metoprolol 12.5 milligram(s) orally 2 times a day Active  Imdur 30 mg oral tablet, extended release 1 tab(s) orally once a day (in the morning) Active  benazepril 20 mg oral tablet 1 tab(s) orally once a day (1/2 of 40 mg tab) Active  atorvastatin 40 mg oral tablet 1 tab(s) orally once a day (at bedtime) Active  Plavix 75 mg oral tablet 1 tab(s) orally once a day Active   EKG:   Interpretation EKG shows NSR with RBBB, no significant ST or T wave changes    Rate 66    No Known Allergies:   Vital Signs/Nurse's Notes: **Vital Signs.:   09-May-13 09:00   Vital Signs Type Routine   Temperature Temperature (F) 99.2   Celsius 37.3   Temperature Source oral  Pulse Pulse 64   Pulse source per Dinamap   Respirations Respirations 20   Systolic BP Systolic BP 629   Diastolic BP (mmHg) Diastolic BP (mmHg) 63   Mean BP 85   BP Source Dinamap   Pulse Ox % Pulse Ox % 94   Pulse Ox Activity Level  At rest   Oxygen Delivery Room Air/ 21 %     Impression 59y/o M with PMH of HTN and IDDM, long smoking hx, hyperlipidemia, comes in for chest pain. recent non-ST elevation myocardial infarction.  1)  chest pain: His symptoms are somewhat atypical and do not seem to be anginal in nature. Given his recent MI, I will check an echocardiogram to make sure he does not have pericarditis or some  other etiologies for his chest pain.ECG does not show any acute changes and cardiac enzymes are negative. He probably will need to stay off work also for another week and will likely benefit from cardiac rehabilitation. I definitely think that there is a component of anxiety to his symptoms. I will add Ranexa 500 mg twice daily. No plans for PCI at this time. heparin can be discontinued if his cardiac enzymes remain negative.  2) HTN-  continue current medications.  3) IDDM-  hba1c elevated,  on ssi, humalog 75/25 bid  4) Hyperlipidemia-  continue atorvastatin.  5) GERD: This is likely contributing to some of his symptoms. I recommend Protonix 40 mg once daily. Continue to monitor today. He likely can be discharged home tomorrow if he remains stable.   Electronic Signatures: Kathlyn Sacramento (MD)  (Signed 09-May-13 11:33)  Authored: General Aspect/Present Illness, History and Physical Exam, Review of System, Home Medications, EKG , Allergies, Vital Signs/Nurse's Notes, Impression/Plan   Last Updated: 09-May-13 11:33 by Kathlyn Sacramento (MD)

## 2014-07-27 NOTE — H&P (Signed)
PATIENT NAME:  Douglas Hunter, Douglas Hunter MR#:  [id number] DATE OF BIRTH:  [birth year]  DATE OF ADMISSION:  08/02/2011  ADMITTING PHYSICIAN: Dr. Gladstone Lighter  PRIMARY CARE PHYSICIAN: Kathrine Haddock, NP at Dr. Alice Rieger practice  CHIEF COMPLAINT: Chest pain.   HISTORY OF PRESENT ILLNESS: Douglas Hunter is a 59 year old Caucasian male with past medical history significant for hypertension, insulin-dependent diabetes mellitus and hyperlipidemia comes in with chest pain that started this morning. Patient has not had prior cardiac history, no prior stress test. He says he woke up and has been having intermittent chest pain all day long. It was more like a dull ache. No radiation. Not associated with any nausea, vomiting, or diaphoresis. His pain improved with nitroglycerin and aspirin in the ED and had an elevation of first set of troponin at 0.56. So he is being admitted for NSTEMI treatment. Currently he is chest pain free.   PAST MEDICAL HISTORY:  1. Hypertension for nine years. 2. Insulin-dependent type 2 diabetes mellitus for 25 years.  3. Hyperlipidemia.   PAST SURGICAL HISTORY: None.   MEDICATIONS AT HOME: He does not know what medications he takes at home and he is going to call his wife to get the medication list but he does know he takes Humalog 75/25, insulin 55 units in the morning and 50 at bedtime.   ALLERGIES: No known drug allergies.   SOCIAL HISTORY: Lives at home with wife. Used to smoke 2 packs per day but quit five years ago. Occasional beer intake.   FAMILY HISTORY: Mom lived up to 51, had a few strokes before she passed away. Does not know much about his dad.   REVIEW OF SYSTEMS: CONSTITUTIONAL: No fever, fatigue, or weakness. EYES: No blurred vision, double vision, pain, redness, inflammation, or glaucoma. ENT: No tinnitus, ear pain, epistaxis, hearing loss. RESPIRATORY: No cough, wheeze, hemoptysis, or chronic obstructive pulmonary disease. CARDIOVASCULAR: Positive for chest  pain. No orthopnea, edema, arrhythmia, palpitations, or syncope. GASTROINTESTINAL: No nausea, vomiting, diarrhea, abdominal pain, hematemesis, or melena. GENITOURINARY: No dysuria, hematuria, renal calculus, or frequency. ENDOCRINE: No polyuria, nocturia, thyroid problems, heat or cold intolerance. HEMATOLOGY: No anemia, easy bruising or bleeding. SKIN: No acne, rash, or lesions. MUSCULOSKELETAL: No neck, back, shoulder pain, arthritis, or gout. NEUROLOGIC: No numbness, weakness, cerebrovascular accident, transient ischemic attack, or seizures. PSYCHOLOGICAL: No anxiety, insomnia, or depression.   PHYSICAL EXAMINATION:  VITAL SIGNS: Temperature 98.2 degrees Fahrenheit, pulse 76, respirations 20, blood pressure 176/82, pulse oximetry 98% on room air.   GENERAL: Well built, well nourished male lying in bed, not in any acute distress.   HEENT: Normocephalic, atraumatic. Pupils equal, round, reacting to light. Anicteric sclerae. Extraocular movements intact. Oropharynx clear without erythema, mass, or exudates.   NECK: Supple. No thyromegaly, jugular venous distention or carotid bruits. No lymphadenopathy.   LUNGS: Moving air bilaterally. No wheeze or crackles. No use of accessory muscles for breathing.   CARDIOVASCULAR: S1, S2 regular rate and rhythm. No murmurs, rubs, or gallops.   ABDOMEN: Soft, nontender, nondistended. No hepatosplenomegaly. Normal bowel sounds.  EXTREMITIES: No pedal edema. No clubbing or cyanosis. 2+ dorsalis pedis pulses palpable bilaterally.   LYMPHATICS: No cervical lymphadenopathy.   NEUROLOGIC: Cranial nerves intact. No focal motor or sensory deficits.   PSYCH: Patient is awake, alert, oriented x3.   LABORATORY, DIAGNOSTIC AND RADIOLOGICAL DATA: WBC 11.3, hemoglobin 14.3, hematocrit 42.1, platelet count 265.  Sodium 137, potassium 3.7, chloride 103, bicarbonate 26, BUN 15, creatinine 0.97, glucose 106, calcium 9.3. CK  212, CK-MB 6.4, troponin 0.56. Chest x-ray was  performed, results not available yet. Initial EKG was normal sinus rhythm. Repeat EKG showed normal sinus rhythm with right bundle branch block.   ASSESSMENT AND PLAN: 60 year old male with history of hypertension and diabetes admitted for chest pain and first set of troponins elevated.  1. NSTEMI. Admit to telemetry. Recycle further cardiac enzymes. Cardiology already consulted, discussed with Dr. Rockey Situ, will keep him n.p.o. after midnight. If enzymes keep rising he probably might get cardiac catheterization tomorrow morning. He received one dose of Lovenox tonight so will hold off on IV heparin for now and depending upon his catheterization timing discuss with cardiology before restarting heparin drip. Continue aspirin, nitro patch, betablocker, lisinopril and statin.  2. Hypertension. For now placed on nitroglycerin, metoprolol and lisinopril. Verify home medications.  3. Insulin-dependent diabetes mellitus. Follow up hemoglobin A1c. Continue Humalog 75/25 b.i.d. and sliding scale insulin.  4. Hyperlipidemia. On simvastatin for now. 5. GI and deep vein thrombosis prophylaxis. Patient on Protonix and received Lovenox. 6. CODE STATUS: FULL CODE.   TIME SPENT ON ADMISSION: 50 minutes.  ____________________________ Gladstone Lighter, MD rk:cms D: 08/02/2011 21:48:46 ET T: 08/03/2011 08:20:43 ET JOB#: [id number]  cc: Gladstone Lighter, MD, <Dictator> Cheryl A. Julian Hy, NP  Gladstone Lighter MD ELECTRONICALLY SIGNED 08/09/2011 14:25

## 2014-07-27 NOTE — Consult Note (Signed)
General Aspect 59 yo male with PMH of diabetes, HTN, hyperlipidemia, long smoking hx (stopped a few years ago) presenting with chest pain stuttering through the day yesterday. Cardiology was consulted for chest pain and elevated cardiac enz.  He had fluttering in his chest from irregular heart rate on Monday night. He woke up on tuesday with chest pain. He went to work and had waxing waning chest pain through the day. Level of pain estimated 5/10 at times. He tried walking at work and uncertain if pain got worse. That evening he decided to go to the hospital as it was still bothering him.  In the ER, troponin was mildly elevated. He was given lovenox, b-blocker, ASA, NTG patch. He continued to have some mild chest pain, last episode was 2 AM.  he denies chest pain like this before.    Present Illness . Social:  Long smoking hx, stopped 4 to 5 years ago. No significant ETOH. Lives with his wife, works in a factory  Family Fx:  Parents had CAD, HTN   Physical Exam:   GEN well developed, well nourished, no acute distress    HEENT red conjunctivae    NECK supple  No masses    RESP normal resp effort  clear BS    CARD Regular rate and rhythm  Murmur    Murmur Systolic    ABD denies tenderness  soft    LYMPH negative neck    EXTR negative edema    SKIN normal to palpation    NEURO cranial nerves intact, motor/sensory function intact    PSYCH alert, A+O to time, place, person, good insight   Review of Systems:   Subjective/Chief Complaint Chest pain, now resolved    General: No Complaints    Skin: No Complaints    ENT: No Complaints    Eyes: No Complaints    Neck: No Complaints    Respiratory: No Complaints    Cardiovascular: Chest pain or discomfort    Gastrointestinal: No Complaints    Genitourinary: No Complaints    Vascular: No Complaints    Musculoskeletal: No Complaints    Neurologic: No Complaints    Hematologic: No Complaints    Endocrine: No  Complaints    Psychiatric: No Complaints    Review of Systems: All other systems were reviewed and found to be negative    Medications/Allergies Reviewed Medications/Allergies reviewed     Hypercholesterolemia:    Diabetes:    Hypertension:        Admit Diagnosis:   NSTEMI: 03-Aug-2011, Active, NSTEMI      Admit Reason:   NSTEMI (non-ST elevated myocardial infarction): (410.70) Active, ICD9, Acute myocardial infarction, subendocardial infarction, episode of care unspecified  Home Medications: Medication Instructions Status  Humalog Mix 75/25 subcutaneous suspension 55 unit(s) in a.m and 50 unit(s) in p.m. subcutaneous  Active  benazepril 40 mg oral tablet 1 tab(s) orally once a day Active  hydrochlorothiazide 25 mg oral tablet 1 tab(s) orally once a day Active  atorvastatin 20 mg oral tablet 1 tab(s) orally once a day (at bedtime) Active  amlodipine 10 mg oral tablet 1 tab(s) orally once a day Active  Viagra 25 milligram(s) orally , As Needed Active   Routine Hem:  30-Apr-13 18:33    WBC (CBC) 11.0   RBC (CBC) 4.59   Hemoglobin (CBC) 14.3   Hematocrit (CBC) 43.1   Platelet Count (CBC) 265   MCV 94   MCH 31.1   MCHC 33.2  RDW 13.2  Routine Chem:  30-Apr-13 18:33    Glucose, Serum 106   BUN 15   Creatinine (comp) 0.97   Sodium, Serum 137   Potassium, Serum 3.7   Chloride, Serum 103   CO2, Serum 26   Calcium (Total), Serum 9.3   Anion Gap 8   Osmolality (calc) 275   eGFR (African American) >60   eGFR (Non-African American) >60  Cardiac:  30-Apr-13 18:33    Troponin I 0.56   CK, Total 212   CPK-MB, Serum 6.4   EKG:   Interpretation EKG shows NSR with RBBB, no significant ST or T wave changes    Rate 66   Radiology Results: XRay:    30-Apr-13 20:47, Chest Portable Single View   Chest Portable Single View    PRELIMINARY REPORT    The following is a PRELIMINARY Radiology report.  A final report will follow pending radiologist  verification.      REASON FOR EXAM:    chest pain  COMMENTS:       PROCEDURE: DXR - DXR PORTABLE CHEST SINGLE VIEW  - Aug 02 2011  8:47PM     RESULT: Comparison is made to the prior exam of 06/28/2009. The lung   fields are clear. The heart, mediastinal and osseous structures show no   significant abnormalities.    IMPRESSION:     No acute changes are identified.      Thank you for the opportunity to contribute to the care of your patient.     Dictated By: Dionne Ano WALL, M.D., MD    No Known Allergies:   Vital Signs/Nurse's Notes: **Vital Signs.:   01-May-13 04:04   Vital Signs Type Q 4hr   Temperature Temperature (F) 98.2   Celsius 36.7   Temperature Source oral   Pulse Pulse 52   Pulse source per Dinamap   Respirations Respirations 18   Systolic BP Systolic BP 161   Diastolic BP (mmHg) Diastolic BP (mmHg) 69   Mean BP 92   BP Source Dinamap   Pulse Ox % Pulse Ox % 96   Pulse Ox Activity Level  At rest   Oxygen Delivery Room Air/ 21 %     Impression 59y/o M with PMH of HTN and IDDM, long smoking hx, hyperlipidemia, comes in for chest pain.  CKMB and troponin elevated, now trending down EKG with RBBB  1)  NSTEMI-  on asa, nitro, metoprolol, lisinopril and statin recived lovenox last night --I have discussed various options with him for management. He would like a cardiac cath today. Will schedule for around noon. Clear liquid diet now then NPO. Hold on heparin as no active chest pain.  2) HTN-  Will need to verify home meds  3) IDDM-  hba1c elevated,  on ssi, humalog 75/25 bid  4) Hyperlipidemia-  on simvastatin   Electronic Signatures: [name] (MD)  (Signed 01-May-13 08:06)  Authored: General Aspect/Present Illness, History and Physical Exam, Review of System, Past Medical History, Health Issues, Home Medications, Labs, EKG , Radiology, Allergies, Vital Signs/Nurse's Notes, Impression/Plan   Last Updated: 01-May-13 08:06 by [name]  (MD)

## 2014-07-27 NOTE — H&P (Signed)
PATIENT NAME:  Douglas Hunter, Douglas Hunter MR#:  [id number] DATE OF BIRTH:  1955/05/13  DATE OF ADMISSION:  08/10/2011  REFERRING PHYSICIAN:  Ponciano Ort, MD  PRIMARY CARE PHYSICIAN: Golden Pop, MD  CARDIOLOGIST: [name], MD  CHIEF COMPLAINT: Chest pain.  HISTORY OF PRESENT ILLNESS: The patient is a 59 year old Caucasian male with history of coronary artery disease status post recent NSTEMI who was just hospitalized from late April until early May for NSTEMI and underwent cardiac catheterization showing severe mid left circumflex disease with collaterals from RCA to left circumflex with normal ejection fraction of greater than 55% who was evaluated by Dr. Rockey Situ and discharged with aspirin, Plavix, beta blocker, ACE inhibitor, and Imdur. He was told the lesion is a high risk lesion and if the patient is  experiencing significant chronic chest pain he is to come back for further evaluation and possible intervention at [facility]. His medications were optimized and he was discharged on 08/04/2011. The patient states that he has had off and on chest pain with activity since then. Chest pain is from 4 to 5/10 in severity without radiation, shortness of breath, or dizziness. There are no visual changes or nausea associated with this. Today he had to take two nitroglycerin. Therefore, he came into the hospital. Currently he is chest pain free. He has a negative troponin. Furthermore, his EKG does not show any acute ST elevations and the hospitalist service was contacted for further evaluation and management.   PAST MEDICAL HISTORY:  1. Hypertension. 2. Insulin-dependent diabetes mellitus. 3. Hyperlipidemia. 4. Previous tobacco abuse. 5. History of coronary artery disease with cardiac catheterization on 08/03/2011 showing severe mid left circumflex disease with collaterals from RCA to left circumflex. Normal ejection fraction with ejection fraction of greater than 55%. No significant AS or MR.   6. Possible diastolic dysfunction which appears to be chronic based on echo done on 08/03/2011.   MEDICATIONS:  1. Aspirin 81 mg daily.  2. Plavix 75 mg daily.  3. Metoprolol 12.5 mg twice daily.  4. Imdur 30 mg daily.  5. Benazepril 20 mg daily.  6. Lipitor 40 mg at bedtime.  7. Humalog mix 75/25 55 units in the morning and 50 units at bedtime.   ALLERGIES: No known drug allergies.   SOCIAL HISTORY: He lives with his wife. He used to smoke two packs per day but quit five years ago. Occasional alcohol. No drugs.   FAMILY HISTORY: Mom with a stroke and myocardial infarction. Uncles with myocardial infarction.  REVIEW OF SYSTEMS: CONSTITUTIONAL: No fever, fatigue or weakness. Chest pain as above. No weight changes. EYES: Denies blurry vision, glaucoma, or cataracts. ENT: No tinnitus or hearing loss. RESPIRATORY: No cough, wheezing, shortness of breath, or COPD. CARDIOVASCULAR: Chest pain as above. No orthopnea, edema, or arrhythmia. No syncope. Has no shortness of breath on ambulation. GI: No nausea, vomiting, diarrhea, abdominal pain, rectal bleeding, or emesis. GU: No dysuria, hematuria, or frequency. ENDOCRINE: No polyuria or nocturia or thyroid problems. MUSCULOSKELETAL: No arthritis. NEUROLOGIC: No numbness, weakness, or transient ischemic attack. PSYCHIATRIC: No anxiety, insomnia, or depression.   PHYSICAL EXAMINATION:   VITAL SIGNS: Temperature 98, pulse currently 63, respiratory rate 16, blood pressure on arrival 172/81 and currently 156/61, and oxygen saturation 98% on room air.   GENERAL: The patient is a well-built, well-developed Caucasian male sitting in bed, in no obvious distress.   HEENT: Normocephalic, atraumatic. Pupils are equal and reactive. Anicteric sclerae. Moist mucous membranes.   NECK: Supple. No  JVD. No thyroid tenderness. No lymphadenopathy in the cervical region.   HEART:  S1 and S2 regular rate and rhythm. No murmurs, rubs, or gallops.   LUNGS: Clear to  auscultation without wheezing or rhonchi.   ABDOMEN: Soft, nontender, and nondistended.   EXTREMITIES: No significant edema.   NEUROLOGIC: Cranial nerves II through XII grossly intact. Strength 5/5 in all extremities.   PSYCH: Awake, alert, and oriented x3. Pleasant. Cooperative.   LABS/STUDIES: Sodium 139, creatinine 0.95, BUN 14, glucose 135, potassium 4.1, and chloride 106. Troponin negative. CK-MB 2.2. CK total 151. WBC 9, hemoglobin 13.5, hematocrit 40.5, and platelets 251.   Cardiac catheterization as above.   Echocardiogram with result as above.   EKG: Normal sinus rhythm, right bundle branch block. No acute ST elevations or depressions.   ASSESSMENT AND PLAN: We have a 59 year old Caucasian male with hypertension, hyperlipidemia, diabetes, coronary artery disease status post cardiac catheterization and previous hospitalization on 08/03/2011 who was discharged with maximization of his medications who presents with recurrent continued chest pain with negative troponin and no significant EKG changes. The patient likely has uncontrolled unstable angina and the source being the previous left circumflex lesion, likely. At this point, would admit the patient for observation and start the patient on heparin drip as well as resume his aspirin, Plavix, beta blocker, and ACE inhibitor. I would also start the patient on nitro paste and morphine p.r.n. for symptomatic relief. Would consult with cardiology. It is possible that the patient might need transfer for high risk intervention to [facility] per previous notes from Dr. Rockey Situ. In regards to high blood pressure, I would continue his outpatient medications. In regards to his diabetes, would     resume his outpatient insulin. His hemoglobin A1c last hospitalization was 7.4. We would also start him on sliding scale insulin. I would continue his statin. The patient is FULL CODE.  TOTAL TIME SPENT: 40  minutes. ____________________________ Vivien Presto, MD sa:slb D: 08/10/2011 22:07:34 ET T: 08/11/2011 08:57:03 ET JOB#: [id number]  cc: Vivien Presto, MD, <Dictator> Guadalupe Maple, MD Vivien Presto MD ELECTRONICALLY SIGNED 08/19/2011 16:30

## 2014-07-27 NOTE — Discharge Summary (Signed)
PATIENT NAME:  Douglas Hunter, Douglas Hunter MR#:  [id number] DATE OF BIRTH:  Oct 14, 1955  DATE OF ADMISSION:  08/02/2011 DATE OF DISCHARGE:  08/04/2011  DISCHARGE DIAGNOSES:  1. Acute non-ST elevation myocardial infarction status post cardiac catheterization suggestive of severe mid left circumflex disease, advised medical management.  2. Diabetes.  3. Hypertension.  4. Hyperlipidemia.   CONSULTANT: [name], MD - Cardiology.  PROCEDURE: Cardiac catheterization.   HOSPITAL COURSE: This is a 59 year old male with history of diabetes for more than 20 years, hypertension and hyperlipidemia. He presented with chest pain which has some typical features. It improved with nitroglycerin and also at rest. He was admitted as acute non-ST elevation myocardial infarction. When he came in his troponin was in the range of 0.56 with MB of 6.4 and CK of 212. His troponin went down to 0.36 and now is 0.23. He was already on aspirin. He was placed on a statin, beta blocker, and ACE inhibitor, and he was also started on Lovenox. He was taken to cardiac catheterization on 08/03/2011 which showed that the patient has severe mid left circumflex disease, collaterals are present from RCA to left circumflex, normal ejection fraction of greater than 55%, and no significant MR. Cardiology suggested to start him on Plavix and Imdur and also low dose beta blocker. The case was presented in Cath Conference and they suggested medical management for now and if he continues to have chest pain then he may need a higher risk stent later. So his medications have been optimized. Now he is on Plavix, aspirin, Imdur, low dose beta blocker, ACE inhibitor, and his statin has been advanced. We are going to stop his hydrochlorothiazide and Norvasc. Currently he is chest pain free. When he came in, his white count was a little bit elevated at 11,000, repeat one was normal at 10.1, and hemoglobin stable in the range of 13 to 14. He had an echocardiogram  done which showed that he has LV function normal, ejection fraction greater than 55%, some impaired LV relaxation, right ventricular function normal, RV is mildly dilated, and right ventricle pressures are normal. EKG when he came in showed that he had normal sinus rhythm with left axis deviation with right bundle branch block. His chest x-ray was negative. His creatinine is stable at 0.93. His Hemoglobin A1c was 7.4. His lipid profile shows a LDL of 76. His magnesium yesterday was 1.6. We gave him 2 grams of magnesium sulfate.  DISCHARGE MEDICATIONS:  1. Ecotrin 81 mg p.o. once daily. 2. Plavix 75 mg p.o. once daily. 3. Metoprolol 12.5 mg p.o. twice a day. 4. Imdur 30 mg p.o. once daily. 5. Change Benazepril to 20 mg 1/2 tablet of 40 mg p.o. once daily. 6. Change Atorvastatin to 40 mg p.o. at bedtime.  7. Humalog Mix 75/25, 55 units in the morning and 50 until at bedtime subcutaneously.  NOTE: Do not take amlodipine, do not take hydrochlorothiazide, do not take Viagra.   CONDITION AT DISCHARGE: He is comfortable. No chest pain. T-max 97.7, heart rate 56 to 61, blood pressure 127/75, and saturating 97% on room air. Chest is clear. Heart sounds are regular. Abdomen is soft and nontender. His sugars have been fluctuating in the range of 81 to 79. I will not change his diabetic regimen at this time. He needs to followup with his primary care physician and see how sugars do at home. Considering his Lipitor has been advanced, I would suggest to check an LFT as an outpatient.  DIET: I advised a low sodium, ADA, low cholesterol diet.   ACTIVITY: As tolerated.    DISCHARGE FOLLOWUP: Followup with Ms. Kathrine Haddock and Dr. Jeananne Rama in 1 to 2 weeks. Followup LFTs in PCP's office. Followup with [facility], Dr. Rockey Situ, in one to two weeks.   TIME SPENT: 45 minutes. ____________________________ Mena Pauls, MD ag:slb D: 08/04/2011 10:05:50 ET T: 08/05/2011 09:45:48  ET JOB#: [id number]  cc: Mena Pauls, MD, <Dictator> Minna Merritts, MD Guadalupe Maple, MD Mena Pauls MD ELECTRONICALLY SIGNED 08/13/2011 12:30

## 2014-07-28 ENCOUNTER — Ambulatory Visit (INDEPENDENT_AMBULATORY_CARE_PROVIDER_SITE_OTHER): Payer: 59 | Admitting: Cardiovascular Disease

## 2014-07-28 ENCOUNTER — Encounter: Payer: Self-pay | Admitting: Cardiovascular Disease

## 2014-07-28 VITALS — BP 130/68 | HR 52 | Ht 71.0 in | Wt 245.0 lb

## 2014-07-28 DIAGNOSIS — I480 Paroxysmal atrial fibrillation: Secondary | ICD-10-CM | POA: Diagnosis not present

## 2014-07-28 DIAGNOSIS — I10 Essential (primary) hypertension: Secondary | ICD-10-CM

## 2014-07-28 DIAGNOSIS — R079 Chest pain, unspecified: Secondary | ICD-10-CM | POA: Diagnosis not present

## 2014-07-28 DIAGNOSIS — E785 Hyperlipidemia, unspecified: Secondary | ICD-10-CM

## 2014-07-28 DIAGNOSIS — I2510 Atherosclerotic heart disease of native coronary artery without angina pectoris: Secondary | ICD-10-CM

## 2014-07-28 DIAGNOSIS — E1159 Type 2 diabetes mellitus with other circulatory complications: Secondary | ICD-10-CM

## 2014-07-28 MED ORDER — SILDENAFIL CITRATE 20 MG PO TABS: 20.0000 mg | ORAL_TABLET | Freq: Three times a day (TID) | ORAL | Status: DC | PRN
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
## 2014-07-28 NOTE — Assessment & Plan Note (Signed)
We have encouraged continued exercise, careful diet management in an effort to lose weight. 

## 2014-07-28 NOTE — Assessment & Plan Note (Signed)
Currently with no symptoms of angina. No further workup at this time. Continue current medication regimen. 
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
## 2014-07-28 NOTE — Assessment & Plan Note (Signed)
Rare short episodes. He is currently on anticoagulation. No changes to his medications

## 2014-07-28 NOTE — Patient Instructions (Addendum)
You are doing well.  Please call the office if chest pain gets worse, We would order a stress test  Please call us if you have new issues that need to be addressed before your next appt.  Your physician wants you to follow-up in: 6 months.  You will receive a reminder letter in the mail two months in advance. If you don't receive a letter, please call our office to schedule the follow-up appointment.

## 2014-07-28 NOTE — Assessment & Plan Note (Signed)
Encouraged him to stay on his Lipitor 

## 2014-08-04 ENCOUNTER — Telehealth: Payer: Self-pay | Admitting: *Deleted

## 2014-08-04 NOTE — Telephone Encounter (Signed)
Patient may stop his Xarelto 2 days prior to his dental procedure. Restart 24 hours post procedure or when hemostasis/DDS has said it is ok to resume.

## 2014-08-04 NOTE — Telephone Encounter (Signed)
Pt calling needed [place] to clear him for the teeth pulling, he is needing to be off his blood thinner.  Please advise.   1. What dental office are you calling from? [facility], Dr Roselyn Reef   2. What is your office phone and fax number? [phone] and fax number [phone]  3. What type of procedure is the patient having performed? Tooth being pulled   4. What date is procedure scheduled? Waiting on [place]   5. What is your question (ex. Antibiotics prior to procedure, holding medication-we need to know how long dentist wants pt to hold med)? Needs to be off the blood thinner  6.

## 2014-08-04 NOTE — Telephone Encounter (Signed)
Spoke w/ pt.  Advised him of Ryan's recommendation and that I am sending this info to his dentist.   He is appreciative and will call back w/ any questions or concerns.

## 2014-08-26 ENCOUNTER — Other Ambulatory Visit: Payer: Self-pay | Admitting: Cardiovascular Disease

## 2014-09-10 ENCOUNTER — Other Ambulatory Visit: Payer: Self-pay | Admitting: Cardiovascular Disease

## 2014-11-04 ENCOUNTER — Other Ambulatory Visit: Payer: Self-pay | Admitting: Unknown Physician Specialty

## 2014-12-03 ENCOUNTER — Other Ambulatory Visit: Payer: Self-pay | Admitting: Unknown Physician Specialty

## 2014-12-03 NOTE — Telephone Encounter (Signed)
Pt needs seen.

## 2014-12-29 ENCOUNTER — Other Ambulatory Visit: Payer: Self-pay | Admitting: Unknown Physician Specialty

## 2014-12-29 NOTE — Telephone Encounter (Signed)
Needs check further refills 

## 2014-12-29 NOTE — Telephone Encounter (Signed)
Called and scheduled patient an appointment for 01/13/15.

## 2015-01-13 ENCOUNTER — Encounter: Payer: Self-pay | Admitting: Unknown Physician Specialty

## 2015-01-13 ENCOUNTER — Ambulatory Visit (INDEPENDENT_AMBULATORY_CARE_PROVIDER_SITE_OTHER): Payer: 59 | Admitting: Unknown Physician Specialty

## 2015-01-13 VITALS — BP 161/77 | HR 53 | Temp 98.5°F | Ht 70.5 in | Wt 245.6 lb

## 2015-01-13 DIAGNOSIS — E1165 Type 2 diabetes mellitus with hyperglycemia: Secondary | ICD-10-CM | POA: Diagnosis not present

## 2015-01-13 DIAGNOSIS — E785 Hyperlipidemia, unspecified: Secondary | ICD-10-CM | POA: Diagnosis not present

## 2015-01-13 DIAGNOSIS — Z23 Encounter for immunization: Secondary | ICD-10-CM

## 2015-01-13 DIAGNOSIS — I10 Essential (primary) hypertension: Secondary | ICD-10-CM

## 2015-01-13 DIAGNOSIS — I2510 Atherosclerotic heart disease of native coronary artery without angina pectoris: Secondary | ICD-10-CM

## 2015-01-13 DIAGNOSIS — E1159 Type 2 diabetes mellitus with other circulatory complications: Secondary | ICD-10-CM

## 2015-01-13 LAB — MICROALBUMIN, URINE WAIVED
CREATININE, URINE WAIVED: 10 mg/dL (ref 10–300)
MICROALB, UR WAIVED: 10 mg/L (ref 0–19)

## 2015-01-13 LAB — LIPID PANEL PICCOLO, WAIVED
CHOL/HDL RATIO PICCOLO,WAIVE: 3.1 mg/dL
Cholesterol Piccolo, Waived: 162 mg/dL (ref <200)
HDL CHOL PICCOLO, WAIVED: 53 mg/dL — AB (ref >59)
LDL Chol Calc Piccolo Waived: 97 mg/dL (ref <100)
TRIGLYCERIDES PICCOLO,WAIVED: 62 mg/dL (ref <150)
VLDL CHOL CALC PICCOLO,WAIVE: 12 mg/dL (ref <30)

## 2015-01-13 LAB — BAYER DCA HB A1C WAIVED: HB A1C (BAYER DCA - WAIVED): 7.8 % — ABNORMAL HIGH (ref <7.0)

## 2015-01-13 MED ORDER — INSULIN NPH ISOPHANE & REGULAR (70-30) 100 UNIT/ML ~~LOC~~ SUSP: 60.0000 [IU] | Freq: Two times a day (BID) | SUBCUTANEOUS | Status: DC

## 2015-01-13 MED ORDER — OMEPRAZOLE 20 MG PO CPDR: 20.0000 mg | DELAYED_RELEASE_CAPSULE | Freq: Two times a day (BID) | ORAL | Status: DC

## 2015-01-13 MED ORDER — BENAZEPRIL HCL 40 MG PO TABS: 40.0000 mg | ORAL_TABLET | Freq: Every day | ORAL | Status: DC

## 2015-01-13 MED ORDER — ATORVASTATIN CALCIUM 80 MG PO TABS: 80.0000 mg | ORAL_TABLET | Freq: Every day | ORAL | Status: DC

## 2015-01-13 MED ORDER — METFORMIN HCL 500 MG PO TABS: 500.0000 mg | ORAL_TABLET | Freq: Four times a day (QID) | ORAL | Status: DC

## 2015-01-13 NOTE — Assessment & Plan Note (Addendum)
Increase Lipitor to 80 mg

## 2015-01-13 NOTE — Assessment & Plan Note (Signed)
Hgb A1C is 7.8 Increase Metformin to 4 QD

## 2015-01-13 NOTE — Assessment & Plan Note (Addendum)
Not to goal.  Increase Benazepril from 20 to 40 mg

## 2015-01-13 NOTE — Progress Notes (Signed)
BP 161/77 mmHg  Pulse 53  Temp(Src) 98.5 F (36.9 C)  Ht 5' 10.5" (1.791 m)  Wt 245 lb 9.6 oz (111.403 kg)  BMI 34.73 kg/m2  SpO2 97%   Subjective:    Patient ID: Douglas Hunter, male    DOB: 11-06-1955, 59 y.o.   MRN: [id number]  HPI: Douglas Hunter is a 59 y.o. male  Chief Complaint  Patient presents with  . Diabetes  . Hyperlipidemia  . Hypertension   Diabetes He presents for his follow-up diabetic visit. He has type 2 diabetes mellitus. There are no hypoglycemic associated symptoms. Pertinent negatives for diabetes include no chest pain, no fatigue, no foot paresthesias, no polydipsia, no polyphagia, no polyuria and no weight loss. There are no hypoglycemic complications. Symptoms are stable. He is compliant with treatment all of the time (Takes 60 in the AM and 50 at night). He is currently taking insulin pre-breakfast and pre-dinner. His weight is increasing steadily. He monitors blood glucose at home 1-2 x per day. Blood glucose monitoring compliance is excellent. His home blood glucose trend is increasing steadily. An ACE inhibitor/angiotensin II receptor blocker is being taken. Eye exam is current.  Hyperlipidemia This is a chronic problem. The problem is uncontrolled (Not controlled in office.  Doesn't check at home). There are no known factors aggravating his hyperlipidemia. Pertinent negatives include no chest pain or myalgias. Current antihyperlipidemic treatment includes statins. There are no compliance problems.   Hypertension Pertinent negatives include no chest pain.    Relevant past medical, surgical, family and social history reviewed and updated as indicated. Interim medical history since our last visit reviewed. Allergies and medications reviewed and updated.  Review of Systems  Constitutional: Negative for weight loss and fatigue.  Cardiovascular: Negative for chest pain.  Endocrine: Negative for polydipsia, polyphagia and polyuria.   Musculoskeletal: Negative for myalgias.    Per HPI unless specifically indicated above     Objective:    BP 161/77 mmHg  Pulse 53  Temp(Src) 98.5 F (36.9 C)  Ht 5' 10.5" (1.791 m)  Wt 245 lb 9.6 oz (111.403 kg)  BMI 34.73 kg/m2  SpO2 97%  Wt Readings from Last 3 Encounters:  01/13/15 245 lb 9.6 oz (111.403 kg)  07/28/14 245 lb (111.131 kg)  06/23/14 244 lb 9.6 oz (110.95 kg)    Physical Exam  Constitutional: He is oriented to person, place, and time. He appears well-developed and well-nourished. No distress.  HENT:  Head: Normocephalic and atraumatic.  Eyes: Conjunctivae and lids are normal. Right eye exhibits no discharge. Left eye exhibits no discharge. No scleral icterus.  Cardiovascular: Normal rate, regular rhythm and normal heart sounds.   Pulmonary/Chest: Effort normal and breath sounds normal. No respiratory distress. He has no wheezes. He has no rales.  Abdominal: Normal appearance. There is no splenomegaly or hepatomegaly.  Musculoskeletal: Normal range of motion.  Neurological: He is alert and oriented to person, place, and time.  Skin: Skin is intact. No rash noted. No pallor.  Psychiatric: He has a normal mood and affect. His behavior is normal. Judgment and thought content normal.    Assessment & Plan:   Problem List Items Addressed This Visit      Unprioritized   CAD (coronary artery disease)   Relevant Medications   benazepril (LOTENSIN) 40 MG tablet   atorvastatin (LIPITOR) 80 MG tablet   Other Relevant Orders   Lipid Panel Piccolo, Waived   HTN (hypertension)    Not to goal.  Increase Benazepril from 20 to 40 mg      Relevant Medications   benazepril (LOTENSIN) 40 MG tablet   atorvastatin (LIPITOR) 80 MG tablet   Other Relevant Orders   Lipid Panel Piccolo, Waived   Microalbumin, Urine Waived   Uric acid   Comprehensive metabolic panel   Hyperlipidemia    On max statin.  Defer to Dr. Rockey Situ      Relevant Medications   benazepril  (LOTENSIN) 40 MG tablet   atorvastatin (LIPITOR) 80 MG tablet   Other Relevant Orders   Lipid Panel Piccolo, Waived   Poorly controlled type 2 diabetes mellitus (HCC)    Hgb A1C is 7.8 Increase Metformin to 4 QD      Relevant Medications   benazepril (LOTENSIN) 40 MG tablet   metFORMIN (GLUCOPHAGE) 500 MG tablet   atorvastatin (LIPITOR) 80 MG tablet   insulin NPH-regular Human (HUMULIN 70/30) (70-30) 100 UNIT/ML injection    Other Visit Diagnoses    Immunization due    -  Primary    Relevant Orders    Flu Vaccine QUAD 36+ mos PF IM (Fluarix & Fluzone Quad PF) (Completed)        Follow up plan: Return in about 3 months (around 04/15/2015).

## 2015-01-13 NOTE — Patient Instructions (Signed)
Increase Atorvastatin to 80 mg Increase Benazepril to 40 mg.

## 2015-01-14 LAB — COMPREHENSIVE METABOLIC PANEL
ALBUMIN: 3.9 g/dL (ref 3.6–4.8)
ALK PHOS: 108 IU/L (ref 39–117)
ALT: 28 IU/L (ref 0–44)
AST: 26 IU/L (ref 0–40)
Albumin/Globulin Ratio: 1.3 (ref 1.1–2.5)
BILIRUBIN TOTAL: 0.8 mg/dL (ref 0.0–1.2)
BUN/Creatinine Ratio: 17 (ref 10–22)
BUN: 17 mg/dL (ref 8–27)
CHLORIDE: 101 mmol/L (ref 97–108)
CO2: 21 mmol/L (ref 18–29)
Calcium: 8.9 mg/dL (ref 8.6–10.2)
Creatinine, Ser: 1 mg/dL (ref 0.76–1.27)
GFR calc non Af Amer: 81 mL/min/{1.73_m2} (ref >59)
GFR, EST AFRICAN AMERICAN: 94 mL/min/{1.73_m2} (ref >59)
GLUCOSE: 197 mg/dL — AB (ref 65–99)
Globulin, Total: 2.9 g/dL (ref 1.5–4.5)
Potassium: 4.7 mmol/L (ref 3.5–5.2)
Sodium: 137 mmol/L (ref 134–144)
TOTAL PROTEIN: 6.8 g/dL (ref 6.0–8.5)

## 2015-01-14 LAB — URIC ACID: URIC ACID: 7.1 mg/dL (ref 3.7–8.6)

## 2015-02-04 ENCOUNTER — Other Ambulatory Visit: Payer: Self-pay | Admitting: Unknown Physician Specialty

## 2015-02-13 ENCOUNTER — Other Ambulatory Visit: Payer: Self-pay | Admitting: Cardiovascular Disease

## 2015-03-16 ENCOUNTER — Ambulatory Visit (INDEPENDENT_AMBULATORY_CARE_PROVIDER_SITE_OTHER): Payer: 59 | Admitting: Cardiovascular Disease

## 2015-03-16 ENCOUNTER — Encounter: Payer: Self-pay | Admitting: Cardiovascular Disease

## 2015-03-16 VITALS — BP 120/62 | HR 53 | Ht 71.0 in | Wt 249.8 lb

## 2015-03-16 DIAGNOSIS — I2510 Atherosclerotic heart disease of native coronary artery without angina pectoris: Secondary | ICD-10-CM | POA: Diagnosis not present

## 2015-03-16 DIAGNOSIS — I480 Paroxysmal atrial fibrillation: Secondary | ICD-10-CM

## 2015-03-16 DIAGNOSIS — E785 Hyperlipidemia, unspecified: Secondary | ICD-10-CM

## 2015-03-16 DIAGNOSIS — E1165 Type 2 diabetes mellitus with hyperglycemia: Secondary | ICD-10-CM

## 2015-03-16 DIAGNOSIS — I10 Essential (primary) hypertension: Secondary | ICD-10-CM

## 2015-03-16 MED ORDER — DILTIAZEM HCL 30 MG PO TABS: 30.0000 mg | ORAL_TABLET | Freq: Three times a day (TID) | ORAL | Status: DC | PRN

## 2015-03-16 MED ORDER — AMIODARONE HCL 200 MG PO TABS: 200.0000 mg | ORAL_TABLET | Freq: Two times a day (BID) | ORAL | Status: DC | PRN

## 2015-03-16 MED ORDER — EZETIMIBE 10 MG PO TABS: 10.0000 mg | ORAL_TABLET | Freq: Every day | ORAL | Status: DC

## 2015-03-16 NOTE — Assessment & Plan Note (Signed)
Currently with no symptoms of angina. No further workup at this time. Continue current medication regimen. 

## 2015-03-16 NOTE — Assessment & Plan Note (Signed)
Continues to have rare episodes of paroxysmal atrial fibrillation or flutter. Typically symptoms resolved with diltiazem and amiodarone No changes to his medications, recommended he stay on anticoagulation

## 2015-03-16 NOTE — Progress Notes (Signed)
Patient ID: Douglas Hunter, male    DOB: 08/19/1955, 59 y.o.   MRN: [id number]  HPI Comments: Douglas Hunter is a pleasant 55 -year-old gentleman with CAD, non-STEMI with admission to the hospital in late April 2012 with cardiac catheterization showing severe mid left circumflex disease, subtotally occluded, with collaterals from the RCA to the LAD circumflex, normal ejection fraction who represented to the hospital shortly later with chest pain.  started on medical management,  nitrates and ranexa for his symptoms. Likely sleep apnea but has not had a sleep study.  He has had numerous episodes of atrial flutter. He reports symptoms dating back into his 31s.  2 recent hospitalizations for atrial flutter, 08/11/2012 and recently on 01/11/2013. Both episodes required hospitalization, medications for cardioversion. He has had several episodes that broke on their own  Previous ablation by Dr. Rayann Heman. Ablation for typical atrial flutter, atrial fibrillation.  Several episodes of flutter following the ablation that resolved with a amiodarone. 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 One lasted 30 minutes, second episode one hour. On each episode he took one diltiazem pill, 1 amiodarone and it resolved Denies appreciating any palpitations at night. He has been compliant with his anticoagulation Reports his weight is up, hemoglobin A1c up to 7.8 Total cholesterol 160s, LDL 97, compliant with his Lipitor. This was increased up to 80 mg late last year He denies having any significant chest pain concerning for angina  EKG on today's visit shows normal sinus rhythm with rate 53 bpm, right bundle branch block  Other past medical history   Prior  Echocardiogram shows normal LV systolic function, essentially normal study, May 2013  LDL 81, hemoglobin A1c 8.1, total cholesterol 144, HDL 50   Allergies  Allergen Reactions  . No Known Allergies     Outpatient Encounter Prescriptions as of 03/16/2015  Medication Sig  . amiodarone (PACERONE) 200 MG tablet Take 200 mg by mouth daily as needed (AFIB).   Marland Kitchen aspirin EC 81 MG tablet Take 81 mg by mouth daily.  Marland Kitchen atorvastatin (LIPITOR) 80 MG tablet Take 1 tablet (80 mg total) by mouth daily.  . benazepril (LOTENSIN) 40 MG tablet Take 1 tablet (40 mg total) by mouth daily.  Marland Kitchen diltiazem (CARDIZEM) 30 MG tablet Take 30 mg by mouth 4 (four) times daily as needed (heart rate increases).  . insulin NPH-regular Human (HUMULIN 70/30) (70-30) 100 UNIT/ML injection Inject 60 Units into the skin 2 (two) times daily with a meal.  . isosorbide mononitrate (IMDUR) 30 MG 24 hr tablet TAKE 1 TABLET BY MOUTH EVERY DAY.  . metFORMIN (GLUCOPHAGE) 500 MG tablet Take 1 tablet (500 mg total) by mouth 4 (four) times daily.  . metoprolol tartrate (LOPRESSOR) 25 MG tablet TAKE 1 TABLET BY MOUTH TWICE DAILY  . nitroGLYCERIN (NITROSTAT) 0.4 MG SL tablet Place 1 tablet (0.4 mg total) under the tongue every 5 (five) minutes as needed for chest pain. (Patient taking differently: Place 0.4 mg under the tongue every 5 (five) minutes as needed for chest pain (MAX 3 TABLETS). )  . omeprazole (PRILOSEC) 20 MG capsule Take 1 capsule (20 mg total) by mouth 2 (two) times daily before a meal.  . ONE TOUCH ULTRA TEST test strip TEST THREE  TIMES A DAY AS DIRECTED.  Marland Kitchen VIAGRA 100 MG tablet TAKE 1 TABLET BY MOUTH EVERY DAY AS NEEDED (Patient taking differently: TAKE 1 TABLET BY MOUTH EVERY DAY AS NEEDED (ED))  . XARELTO 20 MG TABS tablet TAKE 1 TABLET BY MOUTH EVERY DAY.   No facility-administered encounter medications on file as of 03/16/2015.    Past Medical History  Diagnosis Date  . Hypertension   . Diabetes mellitus   .  Hyperlipidemia   . Coronary artery disease 4/12    NSTEMI, occluded RCA with collaterals, no intervention required  . Atrial flutter ([place])   . Paroxysmal atrial fibrillation ([facility])   . RBBB   . GERD (gastroesophageal reflux disease)     Past Surgical History  Procedure Laterality Date  . Cardiac catheterization  08/03/2011  . Tee without cardioversion N/A 01/28/2013    Procedure: TRANSESOPHAGEAL ECHOCARDIOGRAM (TEE);  Surgeon: Lelon Perla, MD;  Location: [facility] ENDOSCOPY;  Service: Cardiovascular;  Laterality: N/A;  . Ablation  01/29/2013    PVI and CTI by Dr Rayann Heman for atrial flutter and paroxysmal atrial fibrillation  . Atrial fibrillation ablation N/A 01/29/2013    Procedure: ATRIAL FIBRILLATION ABLATION;  Surgeon: Coralyn Mark, MD;  Location: [place] [facility] CATH LAB;  Service: Cardiovascular;  Laterality: N/A;    Social History  reports that he quit smoking about 7 years ago. His smoking use included Cigarettes. He has a 52.5 pack-year smoking history. He has never used smokeless tobacco. He reports that he drinks alcohol. He reports that he does not use illicit drugs.  Family History family history includes Cancer in his father; Diabetes in his son; Heart attack in his maternal uncle; Heart attack (age of onset: 7) in his father; Heart disease in his brother, mother, and paternal grandfather; Stroke in his mother.  Review of Systems  Constitutional: Negative.   Respiratory: Negative.   Cardiovascular: Negative.   Gastrointestinal: Negative.   Musculoskeletal: Negative.   Skin: Negative.   Neurological: Negative.   Hematological: Negative.   Psychiatric/Behavioral: Negative.   All other systems reviewed and are negative.  BP 120/62 mmHg  Pulse 53  Ht 5\' 11"  (1.803 m)  Wt 249 lb 12 oz (113.286 kg)  BMI 34.85 kg/m2  Physical Exam  Constitutional: He is oriented to person, place, and time. He appears well-developed and well-nourished.  obese  HENT:  Head: Normocephalic.  Nose:  Nose normal.  Mouth/Throat: Oropharynx is clear and moist.  Eyes: Conjunctivae are normal. Pupils are equal, round, and reactive to light.  Neck: Normal range of motion. Neck supple. No JVD present.  Cardiovascular: Normal rate, regular rhythm, S1 normal, S2 normal and intact distal pulses.  Exam reveals no gallop and no friction rub.   Murmur heard.  Systolic murmur is present with a grade of 2/6  Pulmonary/Chest: Effort normal and breath sounds normal. No respiratory distress. He has no wheezes. He has no rales. He exhibits no tenderness.  Abdominal: Soft. Bowel sounds are normal. He exhibits no distension. There is no tenderness.  Musculoskeletal: Normal range of motion. He exhibits no edema or tenderness.  Lymphadenopathy:    He has no cervical adenopathy.  Neurological: He is alert and oriented to person, place, and time. Coordination normal.  Skin: Skin is warm and dry. No rash noted. No erythema.  Psychiatric: He has a normal mood and affect. His behavior is normal. Judgment and thought content normal.      Assessment and Plan   Nursing note and vitals reviewed.

## 2015-03-16 NOTE — Assessment & Plan Note (Signed)
Blood pressure is well controlled on today's visit. No changes made to the medications. 

## 2015-03-16 NOTE — Assessment & Plan Note (Signed)
Encouraged him to start zetia 10 mg daily in addition to Lipitor Goal LDL less than 70

## 2015-03-16 NOTE — Patient Instructions (Signed)
You are doing well.  Please start zetia one a day for cholesterol Stay on generic lipitor  Please call us if you have new issues that need to be addressed before your next appt.  Your physician wants you to follow-up in: 12  months.  You will receive a reminder letter in the mail two months in advance. If you don't receive a letter, please call our office to schedule the follow-up appointment.

## 2015-03-16 NOTE — Assessment & Plan Note (Signed)
We have encouraged continued exercise, careful diet management in an effort to lose weight. Dietary guide provided

## 2015-03-27 ENCOUNTER — Other Ambulatory Visit: Payer: Self-pay | Admitting: Cardiovascular Disease

## 2015-04-15 ENCOUNTER — Ambulatory Visit: Payer: 59 | Admitting: Unknown Physician Specialty

## 2015-04-29 ENCOUNTER — Other Ambulatory Visit: Payer: Self-pay | Admitting: Cardiovascular Disease

## 2015-05-05 ENCOUNTER — Other Ambulatory Visit: Payer: Self-pay | Admitting: Cardiovascular Disease

## 2015-05-27 ENCOUNTER — Other Ambulatory Visit: Payer: Self-pay | Admitting: Unknown Physician Specialty

## 2015-09-08 ENCOUNTER — Other Ambulatory Visit: Payer: Self-pay | Admitting: Cardiovascular Disease

## 2015-09-30 LAB — HM DIABETES EYE EXAM

## 2015-10-19 ENCOUNTER — Other Ambulatory Visit: Payer: Self-pay | Admitting: Cardiovascular Disease

## 2015-10-28 ENCOUNTER — Other Ambulatory Visit: Payer: Self-pay | Admitting: Cardiovascular Disease

## 2015-10-28 ENCOUNTER — Encounter: Payer: 59 | Admitting: Unknown Physician Specialty

## 2015-11-23 ENCOUNTER — Telehealth: Payer: Self-pay | Admitting: Cardiovascular Disease

## 2015-11-23 NOTE — Telephone Encounter (Signed)
Pt states he has had an insurance change, and cannot afford Xarelto, would like to know if there is a different blood thinner that can be prescribed. Please call and advise.

## 2015-11-24 NOTE — Telephone Encounter (Signed)
Patient can't afford Xarelto and would like to know what to do.  He is living to go out of town and needs to know what to change the xarelto to.  Please advise.

## 2015-11-24 NOTE — Telephone Encounter (Signed)
He could check the price of one of the other medications, eliquis and pradaxa Other alternative would be warfarin Would give samples so he can make transition

## 2015-11-24 NOTE — Telephone Encounter (Signed)
Pt calling stating he took his last pill and he is going out of town tomorrow afternoon.  Would like some advise (see note before)

## 2015-11-25 NOTE — Telephone Encounter (Signed)
Pt calling stating he was told to call back to let us know how long he was going out of town He is leaving this evening and won't be back until Monday  He would need samples until then

## 2015-11-25 NOTE — Telephone Encounter (Signed)
Notified patient need to contact his insurance company to see which is less expensive, pradaxa, eliquis or warfarin. The patient will contact our office on which medication to call in.  The patient was given Xarelto 6 bottles of samples.

## 2015-12-01 ENCOUNTER — Telehealth: Payer: Self-pay | Admitting: Cardiovascular Disease

## 2015-12-01 NOTE — Telephone Encounter (Signed)
Please advise patient.  

## 2015-12-01 NOTE — Telephone Encounter (Signed)
Pt calling stating he spoke to nurse last week  He called his insurance to find out which blood thinner would be covered for him He was told anything that is generic  But he did not want to do the Terminous  Please advise.

## 2015-12-03 ENCOUNTER — Telehealth: Payer: Self-pay | Admitting: Gastroenterology

## 2015-12-03 DIAGNOSIS — K219 Gastro-esophageal reflux disease without esophagitis: Secondary | ICD-10-CM | POA: Insufficient documentation

## 2015-12-03 NOTE — Telephone Encounter (Signed)
Spoke w/ pt.  Advised him that the only generic blood thinner options warfarin (Xarelto, Pradaxa & Eliquis do not have generic formulations right now). He states that coumadin checks "would be a big hassle and sounds like a headache". He has some samples of xarelto to last "for a while". He will contact his pharmacy regarding the prices and call us back after he has made a decision.

## 2015-12-03 NOTE — Telephone Encounter (Signed)
colonoscopy

## 2015-12-03 NOTE — Telephone Encounter (Signed)
LMOM for patient to call our office back regarding the Xarelto.

## 2015-12-04 NOTE — Telephone Encounter (Signed)
Called patient and left a voicemail. CHECK PATIENTS INSURANCE & VERIFY!!!

## 2015-12-08 NOTE — Telephone Encounter (Signed)
Called patient and LVM.

## 2015-12-09 ENCOUNTER — Telehealth: Payer: Self-pay | Admitting: Gastroenterology

## 2015-12-09 NOTE — Telephone Encounter (Signed)
Patient has returned phone call and would like to schedule his colonoscopy. Please contact patient at number verified in the chart.

## 2015-12-10 ENCOUNTER — Other Ambulatory Visit: Payer: Self-pay

## 2015-12-10 ENCOUNTER — Telehealth: Payer: Self-pay | Admitting: Cardiovascular Disease

## 2015-12-10 DIAGNOSIS — N529 Male erectile dysfunction, unspecified: Secondary | ICD-10-CM | POA: Insufficient documentation

## 2015-12-10 NOTE — Telephone Encounter (Signed)
Patient returned your call regarding a colonoscopy

## 2015-12-10 NOTE — Telephone Encounter (Signed)
Gastroenterology Pre-Procedure Review  Request Date: 02/02/2016 Requesting Physician: Dr. Kandice Robinsons  PATIENT REVIEW QUESTIONS: The patient responded to the following health history questions as indicated:    1. Are you having any GI issues? no 2. Do you have a personal history of Polyps? no 3. Do you have a family history of Colon Cancer or Polyps? no 4. Diabetes Mellitus? yes (Type 2) 5. Joint replacements in the past 12 months?no 6. Major health problems in the past 3 months?no 7. Any artificial heart valves, MVP, or defibrillator?no    MEDICATIONS & ALLERGIES:    Patient reports the following regarding taking any anticoagulation/antiplatelet therapy:   Plavix, Coumadin, Eliquis, Xarelto, Lovenox, Pradaxa, Brilinta, or Effient? yes (Xarelto ) Aspirin? yes ([facility] )  Patient confirms/reports the following medications:  Current Outpatient Prescriptions  Medication Sig Dispense Refill  . Acetaminophen 500 MG coapsule Take by mouth.    Marland Kitchen amiodarone (PACERONE) 200 MG tablet Take 1 tablet (200 mg total) by mouth 2 (two) times daily as needed (AFIB). 60 tablet 3  . aspirin EC 81 MG tablet Take 81 mg by mouth daily.    Marland Kitchen atorvastatin (LIPITOR) 80 MG tablet   0  . benazepril (LOTENSIN) 40 MG tablet   3  . diltiazem (CARDIZEM) 30 MG tablet Take 1 tablet (30 mg total) by mouth 3 (three) times daily as needed (heart rate increases). 90 tablet 3  . insulin NPH-regular Human (HUMULIN 70/30) (70-30) 100 UNIT/ML injection Inject 60 Units into the skin 2 (two) times daily with a meal. 30 mL 12  . INSULIN SYRINGE 1CC/29G 29G X 1/2" 1 ML MISC USE AS DIRECTED 100 each PRN  . isosorbide mononitrate (IMDUR) 30 MG 24 hr tablet TAKE 1 TABLET BY MOUTH EVERY DAY. 30 tablet 6  . metFORMIN (GLUCOPHAGE) 500 MG tablet Take 1 tablet (500 mg total) by mouth 4 (four) times daily. 360 tablet 3  . metoprolol tartrate (LOPRESSOR) 25 MG tablet TAKE 1 TABLET BY MOUTH TWICE DAILY 180 tablet 3  . nitroGLYCERIN  (NITROSTAT) 0.4 MG SL tablet Place 1 tablet (0.4 mg total) under the tongue every 5 (five) minutes as needed for chest pain. (Patient taking differently: Place 0.4 mg under the tongue every 5 (five) minutes as needed for chest pain (MAX 3 TABLETS). ) 25 tablet 6  . omeprazole (PRILOSEC) 20 MG capsule Take 1 capsule (20 mg total) by mouth 2 (two) times daily before a meal. 180 capsule 3  . ONE TOUCH ULTRA TEST test strip TEST THREE TIMES A DAY AS DIRECTED. 100 each 12  . VIAGRA 100 MG tablet TAKE 1 TABLET BY MOUTH EVERY DAY AS NEEDED 10 tablet 0  . XARELTO 20 MG TABS tablet TAKE 1 TABLET BY MOUTH EVERY DAY 30 tablet 3   No current facility-administered medications for this visit.     Patient confirms/reports the following allergies:  No Known Allergies  No orders of the defined types were placed in this encounter.   AUTHORIZATION INFORMATION Primary Insurance: 1D#: Group #:  Secondary Insurance: 1D#: Group #:  SCHEDULE INFORMATION: Date: 02/02/2016 Time: Location: [place]

## 2015-12-10 NOTE — Telephone Encounter (Signed)
Called patient and LVM. Notification letter is sent.

## 2015-12-10 NOTE — Telephone Encounter (Signed)
Received cardiac clearance request for pt to proceed w/ colonoscopy on 02/02/16 w/ Dr. Allen Norris. They would like instructions on holding Xarelto prior to procedure.  Please fax clearance and recommendations to [place], Attn: Nedra Hai. [place] @ [phone].

## 2015-12-10 NOTE — Telephone Encounter (Signed)
Screening Colonoscopy Z12.11 [facility] 02/02/2016 Patient is on Xarelto. I have sent the medical clearance to his Cardiologist and will call the patient as soon I receive Instructions back.  Patient Has Holland Falling, it has been verified by phone and fax. No authorization is needed for this procedure.

## 2015-12-14 NOTE — Telephone Encounter (Signed)
Acceptable risk for colonoscopy Would stop anticoagulation 2 days prior to procedure Restart after procedures complete

## 2015-12-15 NOTE — Telephone Encounter (Signed)
Routed through Epic to [employer].

## 2015-12-16 ENCOUNTER — Telehealth: Payer: Self-pay

## 2015-12-16 NOTE — Telephone Encounter (Signed)
Patient received clearance to stop his Xarelto 2 days prior to his colonoscopy. He is to resume after the procedure has been completed. I spoke with the patient, he is aware.

## 2016-01-04 ENCOUNTER — Encounter: Payer: 59 | Admitting: Unknown Physician Specialty

## 2016-01-05 ENCOUNTER — Telehealth: Payer: Self-pay | Admitting: Cardiovascular Disease

## 2016-01-05 MED ORDER — WARFARIN SODIUM 5 MG PO TABS: 5.0000 mg | ORAL_TABLET | Freq: Every day | ORAL | 6 refills | Status: DC

## 2016-01-05 NOTE — Telephone Encounter (Signed)
Pt states due to his insurance, he needs to be switched to Warfarin, or something generic. Please call.

## 2016-01-05 NOTE — Telephone Encounter (Signed)
Spoke w/ pt.  He reports that Xarelto is too expensive and he would like to switch to warfarin due to cost. Per Dr. Rockey Situ, will start pt on 5 mg once daily and est w/ coumadin clinic on 01/13/16. Pt will start his first pill on Saturday night.

## 2016-01-13 ENCOUNTER — Ambulatory Visit (INDEPENDENT_AMBULATORY_CARE_PROVIDER_SITE_OTHER): Payer: Managed Care, Other (non HMO)

## 2016-01-13 DIAGNOSIS — I480 Paroxysmal atrial fibrillation: Secondary | ICD-10-CM

## 2016-01-13 DIAGNOSIS — I4892 Unspecified atrial flutter: Secondary | ICD-10-CM

## 2016-01-13 DIAGNOSIS — Z5181 Encounter for therapeutic drug level monitoring: Secondary | ICD-10-CM | POA: Diagnosis not present

## 2016-01-13 LAB — POCT INR: INR: 2.3

## 2016-01-13 NOTE — Patient Instructions (Signed)

## 2016-01-18 ENCOUNTER — Telehealth: Payer: Self-pay

## 2016-01-18 NOTE — Telephone Encounter (Signed)
Clearance noted for upcoming colonoscopy on 02/02/16.

## 2016-01-18 NOTE — Telephone Encounter (Signed)
-----   Message from Minna Merritts, MD sent at 01/17/2016 11:53 AM EDT ----- Should be fine to hold the warfarin, thx TG  ----- Message ----- From: Brynda Peon, RN Sent: 01/13/2016   1:30 PM To: Minna Merritts, MD  Pt is scheduled for colonoscopy on 02/02/16, he was previously cleared to hold Xarelto x 2 days prior to procedure.  Pt has been switched from Xarelto to Warfarin.  Please advise if ok to hold Warfarin 5 days prior to colonoscopy on 02/02/16 with Dr Allen Norris.  Thanks

## 2016-01-19 ENCOUNTER — Other Ambulatory Visit: Payer: Self-pay | Admitting: Cardiovascular Disease

## 2016-01-20 ENCOUNTER — Ambulatory Visit (INDEPENDENT_AMBULATORY_CARE_PROVIDER_SITE_OTHER): Payer: Managed Care, Other (non HMO) | Admitting: *Deleted

## 2016-01-20 DIAGNOSIS — I480 Paroxysmal atrial fibrillation: Secondary | ICD-10-CM | POA: Diagnosis not present

## 2016-01-20 DIAGNOSIS — I4892 Unspecified atrial flutter: Secondary | ICD-10-CM

## 2016-01-20 DIAGNOSIS — Z5181 Encounter for therapeutic drug level monitoring: Secondary | ICD-10-CM | POA: Diagnosis not present

## 2016-01-20 LAB — POCT INR: INR: 2.8

## 2016-01-27 ENCOUNTER — Ambulatory Visit (INDEPENDENT_AMBULATORY_CARE_PROVIDER_SITE_OTHER): Payer: Managed Care, Other (non HMO)

## 2016-01-27 DIAGNOSIS — I4892 Unspecified atrial flutter: Secondary | ICD-10-CM

## 2016-01-27 DIAGNOSIS — Z5181 Encounter for therapeutic drug level monitoring: Secondary | ICD-10-CM

## 2016-01-27 DIAGNOSIS — Z23 Encounter for immunization: Secondary | ICD-10-CM

## 2016-01-27 DIAGNOSIS — I480 Paroxysmal atrial fibrillation: Secondary | ICD-10-CM

## 2016-01-27 LAB — POCT INR: INR: 2.2

## 2016-02-02 ENCOUNTER — Encounter
Admission: RE | Disposition: A | Discharge status: Home / Self Care | Payer: Self-pay | Source: Ambulatory Visit | Attending: Gastroenterology

## 2016-02-02 ENCOUNTER — Ambulatory Visit
Admission: RE | Admit: 2016-02-02 | Discharge: 2016-02-02 | Disposition: A | Discharge status: Home / Self Care | Payer: Managed Care, Other (non HMO) | Source: Ambulatory Visit | Attending: Gastroenterology | Admitting: Gastroenterology

## 2016-02-02 ENCOUNTER — Encounter: Payer: Self-pay | Admitting: *Deleted

## 2016-02-02 ENCOUNTER — Ambulatory Visit: Payer: Managed Care, Other (non HMO) | Admitting: Anesthesiology

## 2016-02-02 DIAGNOSIS — I2510 Atherosclerotic heart disease of native coronary artery without angina pectoris: Secondary | ICD-10-CM | POA: Insufficient documentation

## 2016-02-02 DIAGNOSIS — I252 Old myocardial infarction: Secondary | ICD-10-CM | POA: Diagnosis not present

## 2016-02-02 DIAGNOSIS — I4892 Unspecified atrial flutter: Secondary | ICD-10-CM | POA: Diagnosis not present

## 2016-02-02 DIAGNOSIS — K219 Gastro-esophageal reflux disease without esophagitis: Secondary | ICD-10-CM | POA: Diagnosis not present

## 2016-02-02 DIAGNOSIS — Z79899 Other long term (current) drug therapy: Secondary | ICD-10-CM | POA: Insufficient documentation

## 2016-02-02 DIAGNOSIS — I480 Paroxysmal atrial fibrillation: Secondary | ICD-10-CM | POA: Insufficient documentation

## 2016-02-02 DIAGNOSIS — Z794 Long term (current) use of insulin: Secondary | ICD-10-CM | POA: Diagnosis not present

## 2016-02-02 DIAGNOSIS — Z6832 Body mass index (BMI) 32.0-32.9, adult: Secondary | ICD-10-CM | POA: Insufficient documentation

## 2016-02-02 DIAGNOSIS — Z87891 Personal history of nicotine dependence: Secondary | ICD-10-CM | POA: Insufficient documentation

## 2016-02-02 DIAGNOSIS — I10 Essential (primary) hypertension: Secondary | ICD-10-CM | POA: Insufficient documentation

## 2016-02-02 DIAGNOSIS — Z7901 Long term (current) use of anticoagulants: Secondary | ICD-10-CM | POA: Insufficient documentation

## 2016-02-02 DIAGNOSIS — K635 Polyp of colon: Secondary | ICD-10-CM | POA: Diagnosis not present

## 2016-02-02 DIAGNOSIS — E669 Obesity, unspecified: Secondary | ICD-10-CM | POA: Diagnosis not present

## 2016-02-02 DIAGNOSIS — E785 Hyperlipidemia, unspecified: Secondary | ICD-10-CM | POA: Insufficient documentation

## 2016-02-02 DIAGNOSIS — E119 Type 2 diabetes mellitus without complications: Secondary | ICD-10-CM | POA: Diagnosis not present

## 2016-02-02 DIAGNOSIS — Z1211 Encounter for screening for malignant neoplasm of colon: Secondary | ICD-10-CM

## 2016-02-02 DIAGNOSIS — D125 Benign neoplasm of sigmoid colon: Secondary | ICD-10-CM | POA: Diagnosis not present

## 2016-02-02 DIAGNOSIS — Z7982 Long term (current) use of aspirin: Secondary | ICD-10-CM | POA: Diagnosis not present

## 2016-02-02 DIAGNOSIS — K5730 Diverticulosis of large intestine without perforation or abscess without bleeding: Secondary | ICD-10-CM | POA: Insufficient documentation

## 2016-02-02 DIAGNOSIS — D122 Benign neoplasm of ascending colon: Secondary | ICD-10-CM | POA: Diagnosis not present

## 2016-02-02 DIAGNOSIS — K641 Second degree hemorrhoids: Secondary | ICD-10-CM | POA: Diagnosis not present

## 2016-02-02 HISTORY — PX: COLONOSCOPY WITH PROPOFOL: SHX5780

## 2016-02-02 LAB — GLUCOSE, CAPILLARY: Glucose-Capillary: 238 mg/dL — ABNORMAL HIGH (ref 65–99)

## 2016-02-02 SURGERY — COLONOSCOPY WITH PROPOFOL
Anesthesia: General

## 2016-02-02 MED ORDER — MIDAZOLAM HCL 5 MG/5ML IJ SOLN
INTRAMUSCULAR | Status: DC | PRN
  Administered 2016-02-02: 1 mg via INTRAVENOUS

## 2016-02-02 MED ORDER — EPHEDRINE SULFATE 50 MG/ML IJ SOLN
INTRAMUSCULAR | Status: DC | PRN
  Administered 2016-02-02: 10 mg via INTRAVENOUS
  Administered 2016-02-02: 5 mg via INTRAVENOUS

## 2016-02-02 MED ORDER — PROPOFOL 500 MG/50ML IV EMUL
INTRAVENOUS | Status: DC | PRN
Start: 2016-02-02 — End: 2016-02-02
  Administered 2016-02-02: 150 ug/kg/min via INTRAVENOUS

## 2016-02-02 MED ORDER — FENTANYL CITRATE (PF) 100 MCG/2ML IJ SOLN
INTRAMUSCULAR | Status: DC | PRN
  Administered 2016-02-02: 50 ug via INTRAVENOUS

## 2016-02-02 MED ORDER — PROPOFOL 10 MG/ML IV BOLUS
INTRAVENOUS | Status: DC | PRN
  Administered 2016-02-02: 100 mg via INTRAVENOUS

## 2016-02-02 MED ORDER — PHENYLEPHRINE HCL 10 MG/ML IJ SOLN
INTRAMUSCULAR | Status: DC | PRN
  Administered 2016-02-02: 100 ug via INTRAVENOUS

## 2016-02-02 MED ORDER — LIDOCAINE 2% (20 MG/ML) 5 ML SYRINGE
INTRAMUSCULAR | Status: DC | PRN
  Administered 2016-02-02: 40 mg via INTRAVENOUS

## 2016-02-02 MED ORDER — SODIUM CHLORIDE 0.9 % IV SOLN
INTRAVENOUS | Status: DC | PRN
  Administered 2016-02-02: 09:00:00 via INTRAVENOUS

## 2016-02-02 MED ORDER — SODIUM CHLORIDE 0.9 % IV SOLN
INTRAVENOUS | Status: DC
  Administered 2016-02-02: 09:00:00 via INTRAVENOUS

## 2016-02-02 NOTE — Transfer of Care (Signed)
Immediate Anesthesia Transfer of Care Note  Patient: Douglas Hunter Bring  Procedure(s) Performed: Procedure(s): COLONOSCOPY WITH PROPOFOL (N/A)  Patient Location: PACU and Endoscopy Unit  Anesthesia Type:General  Level of Consciousness: sedated  Airway & Oxygen Therapy: Patient Spontanous Breathing and Patient connected to nasal cannula oxygen  Post-op Assessment: Report given to RN and Post -op Vital signs reviewed and stable  Post vital signs: Reviewed and stable  Last Vitals:  Vitals:   02/02/16 0852  BP: (!) 160/77  Pulse: (!) 59  Resp: 18  Temp: 37.2 C    Last Pain:  Vitals:   02/02/16 0852  TempSrc: Tympanic         Complications: No apparent anesthesia complications

## 2016-02-02 NOTE — Anesthesia Preprocedure Evaluation (Signed)
Anesthesia Evaluation  Patient identified by MRN, date of birth, ID band Patient awake    Reviewed: Allergy & Precautions, NPO status , Patient's Chart, lab work & pertinent test results, reviewed documented beta blocker date and time   History of Anesthesia Complications Negative for: history of anesthetic complications  Airway Mallampati: I  TM Distance: >3 FB Neck ROM: Full    Dental no notable dental hx.    Pulmonary neg sleep apnea, neg COPD, former smoker,    breath sounds clear to auscultation- rhonchi (-) wheezing      Cardiovascular Exercise Tolerance: Good hypertension, Pt. on medications and Pt. on home beta blockers + CAD and + Past MI (hx of NSTEMI)  (-) Cardiac Stents and (-) CABG + dysrhythmias (s/p ablation ) Atrial Fibrillation  Rhythm:Regular Rate:Normal - Systolic murmurs and - Diastolic murmurs    Neuro/Psych negative neurological ROS  negative psych ROS   GI/Hepatic Neg liver ROS, GERD  ,  Endo/Other  diabetes, Type 2, Oral Hypoglycemic Agents, Insulin Dependent  Renal/GU negative Renal ROS     Musculoskeletal   Abdominal (+) + obese,   Peds  Hematology   Anesthesia Other Findings Past Medical History: No date: Atrial flutter ([place]) 4/12: Coronary artery disease     Comment: NSTEMI, occluded RCA with collaterals, no               intervention required No date: Diabetes mellitus No date: GERD (gastroesophageal reflux disease) No date: Hyperlipidemia No date: Hypertension No date: Paroxysmal atrial fibrillation ([facility]) No date: RBBB   Reproductive/Obstetrics                             Anesthesia Physical Anesthesia Plan  ASA: III  Anesthesia Plan: General   Post-op Pain Management:    Induction: Intravenous  Airway Management Planned: Natural Airway  Additional Equipment:   Intra-op Plan:   Post-operative Plan:   Informed Consent: I have reviewed the  patients History and Physical, chart, labs and discussed the procedure including the risks, benefits and alternatives for the proposed anesthesia with the patient or authorized representative who has indicated his/her understanding and acceptance.   Dental advisory given  Plan Discussed with: CRNA and Anesthesiologist  Anesthesia Plan Comments:         Anesthesia Quick Evaluation

## 2016-02-02 NOTE — H&P (Signed)
Lucilla Lame, MD [address] [place], [place] [address] Phone: [phone] Fax : [phone]  Primary Care Physician:  Sherrin Daisy, MD Primary Gastroenterologist:  Dr. Allen Norris  Pre-Procedure History & Physical: HPI:  Douglas Hunter is a 60 y.o. male is here for a screening colonoscopy.   Past Medical History:  Diagnosis Date  . Atrial flutter ([place])   . Coronary artery disease 4/12   NSTEMI, occluded RCA with collaterals, no intervention required  . Diabetes mellitus   . GERD (gastroesophageal reflux disease)   . Hyperlipidemia   . Hypertension   . Paroxysmal atrial fibrillation (HCC)   . RBBB     Past Surgical History:  Procedure Laterality Date  . ABLATION  01/29/2013   PVI and CTI by Dr Rayann Heman for atrial flutter and paroxysmal atrial fibrillation  . ATRIAL FIBRILLATION ABLATION N/A 01/29/2013   Procedure: ATRIAL FIBRILLATION ABLATION;  Surgeon: Coralyn Mark, MD;  Location: [facility] CATH LAB;  Service: Cardiovascular;  Laterality: N/A;  . CARDIAC CATHETERIZATION  08/03/2011  . TEE WITHOUT CARDIOVERSION N/A 01/28/2013   Procedure: TRANSESOPHAGEAL ECHOCARDIOGRAM (TEE);  Surgeon: Lelon Perla, MD;  Location: [facility] ENDOSCOPY;  Service: Cardiovascular;  Laterality: N/A;    Prior to Admission medications   Medication Sig Start Date End Date Taking? Authorizing Provider  Acetaminophen 500 MG coapsule Take by mouth.   Yes Historical Provider, MD  aspirin EC 81 MG tablet Take 81 mg by mouth daily.   Yes Historical Provider, MD  atorvastatin (LIPITOR) 80 MG tablet  11/19/15  Yes Historical Provider, MD  benazepril (LOTENSIN) 40 MG tablet  11/01/15  Yes Historical Provider, MD  insulin NPH-regular Human (HUMULIN 70/30) (70-30) 100 UNIT/ML injection Inject 60 Units into the skin 2 (two) times daily with a meal. 01/13/15  Yes Kathrine Haddock, NP  isosorbide mononitrate (IMDUR) 30 MG 24 hr tablet TAKE 1 TABLET BY MOUTH EVERY DAY. 01/19/16  Yes Minna Merritts, MD    metFORMIN (GLUCOPHAGE) 500 MG tablet Take 1 tablet (500 mg total) by mouth 4 (four) times daily. 01/13/15  Yes Kathrine Haddock, NP  metoprolol tartrate (LOPRESSOR) 25 MG tablet TAKE 1 TABLET BY MOUTH TWICE DAILY 09/08/15  Yes Minna Merritts, MD  nitroGLYCERIN (NITROSTAT) 0.4 MG SL tablet Place 1 tablet (0.4 mg total) under the tongue every 5 (five) minutes as needed for chest pain. Patient taking differently: Place 0.4 mg under the tongue every 5 (five) minutes as needed for chest pain (MAX 3 TABLETS).  01/28/14  Yes Minna Merritts, MD  omeprazole (PRILOSEC) 20 MG capsule Take 1 capsule (20 mg total) by mouth 2 (two) times daily before a meal. 01/13/15  Yes Kathrine Haddock, NP  warfarin (COUMADIN) 5 MG tablet Take 1 tablet (5 mg total) by mouth daily. 01/05/16  Yes Minna Merritts, MD  amiodarone (PACERONE) 200 MG tablet Take 1 tablet (200 mg total) by mouth 2 (two) times daily as needed (AFIB). 03/16/15   Minna Merritts, MD  diltiazem (CARDIZEM) 30 MG tablet Take 1 tablet (30 mg total) by mouth 3 (three) times daily as needed (heart rate increases). Patient not taking: Reported on 02/02/2016 03/16/15   Minna Merritts, MD  INSULIN SYRINGE 1CC/29G 29G X 1/2" 1 ML MISC USE AS DIRECTED 05/27/15   Kathrine Haddock, NP  ONE TOUCH ULTRA TEST test strip TEST THREE TIMES A DAY AS DIRECTED. 02/04/15   Kathrine Haddock, NP  VIAGRA 100 MG tablet TAKE 1 TABLET BY MOUTH EVERY DAY  AS NEEDED 10/28/15   Minna Merritts, MD    Allergies as of 12/10/2015  . (No Known Allergies)    Family History  Problem Relation Age of Onset  . Heart attack Maternal Uncle   . Heart attack Father 48  . Cancer Father     throat  . Stroke Mother   . Heart disease Mother     pacemaker  . Heart disease Brother     heart murmur  . Diabetes Son   . Heart disease Paternal Grandfather     MI    Social History   Social History  . Marital status: Married    Spouse name: N/A  . Number of children: N/A  . Years of education:  N/A   Occupational History  . Not on file.   Social History Main Topics  . Smoking status: Former Smoker    Packs/day: 1.50    Years: 35.00    Types: Cigarettes    Quit date: 01/16/2008  . Smokeless tobacco: Never Used  . Alcohol use Yes     Comment: occassional  . Drug use: No  . Sexual activity: Yes   Other Topics Concern  . Not on file   Social History Narrative   Pt lives in [place] (right outside of [place])   Works for a [employer] and exercise psychologist in [place]: See HPI, otherwise negative ROS  Physical Exam: BP (!) 160/77   Pulse (!) 59   Temp 98.9 F (37.2 C) (Tympanic)   Resp 18   Ht 5\' 11"  (1.803 m)   Wt 234 lb (106.1 kg)   SpO2 99%   BMI 32.64 kg/m  General:   Alert,  pleasant and cooperative in NAD Head:  Normocephalic and atraumatic. Neck:  Supple; no masses or thyromegaly. Lungs:  Clear throughout to auscultation.    Heart:  Regular rate and rhythm. Abdomen:  Soft, nontender and nondistended. Normal bowel sounds, without guarding, and without rebound.   Neurologic:  Alert and  oriented x4;  grossly normal neurologically.  Impression/Plan: Douglas Hunter is now here to undergo a screening colonoscopy.  Risks, benefits, and alternatives regarding colonoscopy have been reviewed with the patient.  Questions have been answered.  All parties agreeable.

## 2016-02-02 NOTE — Anesthesia Postprocedure Evaluation (Signed)
Anesthesia Post Note  Patient: [name]  Procedure(s) Performed: Procedure(s) (LRB): COLONOSCOPY WITH PROPOFOL (N/A)  Patient location during evaluation: Endoscopy Anesthesia Type: General Level of consciousness: awake and alert and oriented Pain management: pain level controlled Vital Signs Assessment: post-procedure vital signs reviewed and stable Respiratory status: spontaneous breathing, nonlabored ventilation and respiratory function stable Cardiovascular status: blood pressure returned to baseline and stable Postop Assessment: no signs of nausea or vomiting Anesthetic complications: no    Last Vitals:  Vitals:   02/02/16 1002 02/02/16 1012  BP: 99/86 137/62  Pulse: (!) 59 (!) 53  Resp: (!) 21 20  Temp:      Last Pain:  Vitals:   02/02/16 0943  TempSrc: Tympanic                 Douglas Hunter

## 2016-02-02 NOTE — Op Note (Signed)
[facility] Gastroenterology Patient Name: Douglas Hunter Procedure Date: 02/02/2016 9:15 AM MRN: [id number] Account #: [id number] Date of Birth: [birth year] Admit Type: Outpatient Age: 60 Room: [facility] ENDO ROOM 4 Gender: Male Note Status: Finalized Procedure:            Colonoscopy Indications:          Screening for colorectal malignant neoplasm Providers:            Lucilla Lame MD, MD Referring MD:         Shirline Frees (Referring MD) Medicines:            Propofol per Anesthesia Complications:        No immediate complications. Procedure:            Pre-Anesthesia Assessment:                       - Prior to the procedure, a History and Physical was                        performed, and patient medications and allergies were                        reviewed. The patient's tolerance of previous                        anesthesia was also reviewed. The risks and benefits of                        the procedure and the sedation options and risks were                        discussed with the patient. All questions were                        answered, and informed consent was obtained. Prior                        Anticoagulants: The patient has taken no previous                        anticoagulant or antiplatelet agents. ASA Grade                        Assessment: II - A patient with mild systemic disease.                        After reviewing the risks and benefits, the patient was                        deemed in satisfactory condition to undergo the                        procedure.                       After obtaining informed consent, the colonoscope was                        passed under direct vision. Throughout the procedure,  the patient's blood pressure, pulse, and oxygen                        saturations were monitored continuously. The                        Colonoscope was introduced through the anus and                         advanced to the the cecum, identified by appendiceal                        orifice and ileocecal valve. The colonoscopy was                        performed without difficulty. The patient tolerated the                        procedure well. The quality of the bowel preparation                        was good. Findings:      The perianal and digital rectal examinations were normal.      Three sessile polyps were found in the sigmoid colon. The polyps were 2       to 4 mm in size. These polyps were removed with a cold snare. Resection       and retrieval were complete.      A 4 mm polyp was found in the ascending colon. The polyp was sessile.       The polyp was removed with a cold snare. Resection and retrieval were       complete.      A few small-mouthed diverticula were found in the sigmoid colon.      Non-bleeding internal hemorrhoids were found during retroflexion. The       hemorrhoids were Grade II (internal hemorrhoids that prolapse but reduce       spontaneously). Impression:           - Three 2 to 4 mm polyps in the sigmoid colon, removed                        with a cold snare. Resected and retrieved.                       - One 4 mm polyp in the ascending colon, removed with a                        cold snare. Resected and retrieved.                       - Diverticulosis in the sigmoid colon.                       - Non-bleeding internal hemorrhoids. Recommendation:       - Discharge patient to home.                       - Resume previous diet.                       -  Continue present medications.                       - Await pathology results.                       - Repeat colonoscopy in 5 years if polyp adenoma and 10                        years if hyperplastic Procedure Code(s):    --- Professional ---                       [phone], Colonoscopy, flexible; with removal of tumor(s),                        polyp(s), or other lesion(s) by snare  technique Diagnosis Code(s):    --- Professional ---                       Z12.11, Encounter for screening for malignant neoplasm                        of colon                       D12.5, Benign neoplasm of sigmoid colon                       D12.2, Benign neoplasm of ascending colon CPT copyright 2016 American Medical Association. All rights reserved. The codes documented in this report are preliminary and upon coder review may  be revised to meet current compliance requirements. Lucilla Lame MD, MD 02/02/2016 9:35:55 AM This report has been signed electronically. Number of Addenda: 0 Note Initiated On: 02/02/2016 9:15 AM Scope Withdrawal Time: 0 hours 8 minutes 8 seconds  Total Procedure Duration: 0 hours 11 minutes 46 seconds       [facility]

## 2016-02-03 ENCOUNTER — Other Ambulatory Visit: Payer: Self-pay | Admitting: Unknown Physician Specialty

## 2016-02-03 ENCOUNTER — Encounter: Payer: Self-pay | Admitting: Gastroenterology

## 2016-02-03 LAB — SURGICAL PATHOLOGY

## 2016-02-04 ENCOUNTER — Encounter: Payer: Self-pay | Admitting: Gastroenterology

## 2016-02-10 ENCOUNTER — Ambulatory Visit (INDEPENDENT_AMBULATORY_CARE_PROVIDER_SITE_OTHER): Payer: Managed Care, Other (non HMO)

## 2016-02-10 DIAGNOSIS — I4892 Unspecified atrial flutter: Secondary | ICD-10-CM | POA: Diagnosis not present

## 2016-02-10 DIAGNOSIS — I480 Paroxysmal atrial fibrillation: Secondary | ICD-10-CM

## 2016-02-10 DIAGNOSIS — Z5181 Encounter for therapeutic drug level monitoring: Secondary | ICD-10-CM | POA: Diagnosis not present

## 2016-02-10 LAB — POCT INR: INR: 2.5

## 2016-03-02 ENCOUNTER — Ambulatory Visit (INDEPENDENT_AMBULATORY_CARE_PROVIDER_SITE_OTHER): Payer: Managed Care, Other (non HMO)

## 2016-03-02 DIAGNOSIS — I4892 Unspecified atrial flutter: Secondary | ICD-10-CM

## 2016-03-02 DIAGNOSIS — Z5181 Encounter for therapeutic drug level monitoring: Secondary | ICD-10-CM | POA: Diagnosis not present

## 2016-03-02 DIAGNOSIS — I480 Paroxysmal atrial fibrillation: Secondary | ICD-10-CM

## 2016-03-02 LAB — POCT INR: INR: 2.7

## 2016-03-15 ENCOUNTER — Encounter: Payer: Self-pay | Admitting: Cardiovascular Disease

## 2016-03-15 ENCOUNTER — Ambulatory Visit (INDEPENDENT_AMBULATORY_CARE_PROVIDER_SITE_OTHER): Payer: Managed Care, Other (non HMO) | Admitting: Cardiovascular Disease

## 2016-03-15 VITALS — BP 154/80 | HR 54 | Ht 71.0 in | Wt 243.5 lb

## 2016-03-15 DIAGNOSIS — Z794 Long term (current) use of insulin: Secondary | ICD-10-CM

## 2016-03-15 DIAGNOSIS — I10 Essential (primary) hypertension: Secondary | ICD-10-CM | POA: Diagnosis not present

## 2016-03-15 DIAGNOSIS — I2510 Atherosclerotic heart disease of native coronary artery without angina pectoris: Secondary | ICD-10-CM | POA: Diagnosis not present

## 2016-03-15 DIAGNOSIS — I480 Paroxysmal atrial fibrillation: Secondary | ICD-10-CM | POA: Diagnosis not present

## 2016-03-15 DIAGNOSIS — E782 Mixed hyperlipidemia: Secondary | ICD-10-CM

## 2016-03-15 DIAGNOSIS — E1159 Type 2 diabetes mellitus with other circulatory complications: Secondary | ICD-10-CM

## 2016-03-15 MED ORDER — NITROGLYCERIN 0.4 MG SL SUBL: 0.4000 mg | SUBLINGUAL_TABLET | SUBLINGUAL | 6 refills | Status: DC | PRN

## 2016-03-15 MED ORDER — EZETIMIBE 10 MG PO TABS: 10.0000 mg | ORAL_TABLET | Freq: Every day | ORAL | 11 refills | Status: DC

## 2016-03-15 NOTE — Patient Instructions (Addendum)
Medication Instructions:   Please start zetia one pill a day Should be less than 21$ Check www.goodrx.com   Labwork:  No new labs needed  Testing/Procedures:  No further testing at this time   I recommend watching educational videos on topics of interest to you at:       [email or web address].[email or web address]  Enter code: HEARTCARE    Follow-Up: It was a pleasure seeing you in the office today. Please call us if you have new issues that need to be addressed before your next appt.  [phone]  Your physician wants you to follow-up in: 12 months.  You will receive a reminder letter in the mail two months in advance. If you don't receive a letter, please call our office to schedule the follow-up appointment.  If you need a refill on your cardiac medications before your next appointment, please call your pharmacy.

## 2016-03-15 NOTE — Progress Notes (Signed)
Cardiology Office Note  Date:  03/15/2016   ID:  Douglas Hunter, Douglas Hunter 1955-04-18, MRN [id number]  PCP:  Sherrin Daisy, MD   Chief Complaint  Patient presents with  . other    1 yr f/u no complaints. Meds reviewed verbally with pt.    HPI:  Douglas Hunter is a pleasant 60 -year-old gentleman with CAD, non-STEMI with admission to the hospital in late April 2012 with cardiac catheterization showing severe mid left circumflex disease, subtotally occluded, with collaterals from the RCA to the LAD circumflex, normal ejection fraction who represented to the hospital shortly later with chest pain.  started on medical management,  nitrates and ranexa for his symptoms. Likely sleep apnea but has not had a sleep study.  He has had numerous episodes of atrial flutter. He reports symptoms dating back into his 30s.  2 recent hospitalizations for atrial flutter, 08/11/2012 and recently on 01/11/2013. Both episodes required hospitalization, medications for cardioversion. He has had several episodes that broke on their own  Previous ablation by Dr. Rayann Heman. Ablation for typical atrial flutter, atrial fibrillation.  Several episodes of flutter following the ablation that resolved with amiodarone. 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 Less than one hour Last one was a few hours Takes amiodarone as needed  compliant with his anticoagulation Reports his weight is up, hemoglobin A1c up to 8.0 No regular exercise program  Total cholesterol 154, LDL 92, compliant with his Lipitor.  He denies having any significant chest pain concerning for angina  EKG on today's visit shows normal sinus rhythm with rate 54 bpm, right bundle branch block  Other past  medical history Prior Echocardiogram shows normal LV systolic function, essentially normal study, May 2013  LDL 81, hemoglobin A1c 8.1, total cholesterol 144, HDL 50   PMH:   has a past medical history of Atrial flutter ([place]); Coronary artery disease (4/12); Diabetes mellitus; GERD (gastroesophageal reflux disease); Hyperlipidemia; Hypertension; Paroxysmal atrial fibrillation ([place]); and RBBB.  PSH:    Past Surgical History:  Procedure Laterality Date  . ABLATION  01/29/2013   PVI and CTI by Dr Rayann Heman for atrial flutter and paroxysmal atrial fibrillation  . ATRIAL FIBRILLATION ABLATION N/A 01/29/2013   Procedure: ATRIAL FIBRILLATION ABLATION;  Surgeon: Coralyn Mark, MD;  Location: [facility] CATH LAB;  Service: Cardiovascular;  Laterality: N/A;  . CARDIAC CATHETERIZATION  08/03/2011  . COLONOSCOPY WITH PROPOFOL N/A 02/02/2016   Procedure: COLONOSCOPY WITH PROPOFOL;  Surgeon: Lucilla Lame, MD;  Location: [facility] ENDOSCOPY;  Service: Endoscopy;  Laterality: N/A;  . TEE WITHOUT CARDIOVERSION N/A 01/28/2013   Procedure: TRANSESOPHAGEAL ECHOCARDIOGRAM (TEE);  Surgeon: Lelon Perla, MD;  Location: [facility] ENDOSCOPY;  Service: Cardiovascular;  Laterality: N/A;    Current Outpatient Prescriptions  Medication Sig Dispense Refill  . Acetaminophen 500 MG coapsule Take by mouth.    Marland Kitchen amiodarone (PACERONE) 200 MG tablet Take 1 tablet (200 mg total) by mouth 2 (two) times daily as needed (AFIB). 60 tablet 3  . aspirin EC 81 MG tablet Take 81 mg by mouth daily.    Marland Kitchen atorvastatin (LIPITOR) 80 MG tablet   0  . benazepril (LOTENSIN) 40 MG tablet TAKE 1 TABLET(40 MG) BY MOUTH DAILY 90 tablet 0  . diltiazem (CARDIZEM) 30 MG  tablet Take 1 tablet (30 mg total) by mouth 3 (three) times daily as needed (heart rate increases). 90 tablet 3  . insulin NPH-regular Human (HUMULIN 70/30) (70-30) 100 UNIT/ML injection Inject 60 Units into the skin 2 (two) times daily with a meal. 30 mL 12  . INSULIN SYRINGE 1CC/29G 29G X 1/2" 1  ML MISC USE AS DIRECTED 100 each PRN  . isosorbide mononitrate (IMDUR) 30 MG 24 hr tablet TAKE 1 TABLET BY MOUTH EVERY DAY. 30 tablet 3  . metFORMIN (GLUCOPHAGE) 500 MG tablet TAKE 1 TABLET(500 MG) BY MOUTH FOUR TIMES DAILY 360 tablet 0  . metoprolol tartrate (LOPRESSOR) 25 MG tablet TAKE 1 TABLET BY MOUTH TWICE DAILY 180 tablet 3  . nitroGLYCERIN (NITROSTAT) 0.4 MG SL tablet Place 1 tablet (0.4 mg total) under the tongue every 5 (five) minutes as needed for chest pain. 25 tablet 6  . omeprazole (PRILOSEC) 20 MG capsule Take 1 capsule (20 mg total) by mouth 2 (two) times daily before a meal. 180 capsule 3  . ONE TOUCH ULTRA TEST test strip TEST THREE TIMES A DAY AS DIRECTED. 100 each 12  . VIAGRA 100 MG tablet TAKE 1 TABLET BY MOUTH EVERY DAY AS NEEDED 10 tablet 0  . warfarin (COUMADIN) 5 MG tablet Take 1 tablet (5 mg total) by mouth daily. 30 tablet 6  . ezetimibe (ZETIA) 10 MG tablet Take 1 tablet (10 mg total) by mouth daily. 30 tablet 11   No current facility-administered medications for this visit.      Allergies:   Patient has no known allergies.   Social History:  The patient  reports that he quit smoking about 8 years ago. His smoking use included Cigarettes. He has a 52.50 pack-year smoking history. He has never used smokeless tobacco. He reports that he drinks alcohol. He reports that he does not use drugs.   Family History:   family history includes Cancer in his father; Diabetes in his son; Heart attack in his maternal uncle; Heart attack (age of onset: 70) in his father; Heart disease in his brother, mother, and paternal grandfather; Stroke in his mother.    Review of Systems: Review of Systems  Constitutional: Negative.   Respiratory: Negative.   Cardiovascular: Positive for palpitations.  Gastrointestinal: Negative.   Musculoskeletal: Negative.   Neurological: Negative.   Psychiatric/Behavioral: Negative.   All other systems reviewed and are negative.    PHYSICAL  EXAM: VS:  BP (!) 154/80 (BP Location: Left Arm, Patient Position: Sitting, Cuff Size: Normal)   Pulse (!) 54   Ht 5\' 11"  (1.803 m)   Wt 243 lb 8 oz (110.5 kg)   BMI 33.96 kg/m  , BMI Body mass index is 33.96 kg/m. GEN: Well nourished, well developed, in no acute distress, obese  HEENT: normal  Neck: no JVD, carotid bruits, or masses Cardiac: RRR; no murmurs, rubs, or gallops,no edema  Respiratory:  clear to auscultation bilaterally, normal work of breathing GI: soft, nontender, nondistended, + BS MS: no deformity or atrophy  Skin: warm and dry, no rash Neuro:  Strength and sensation are intact Psych: euthymic mood, full affect    Recent Labs: No results found for requested labs within last 8760 hours.    Lipid Panel Lab Results  Component Value Date   CHOL 162 01/13/2015   HDL 47 01/12/2013   LDLCALC 49 01/12/2013   TRIG 62 01/13/2015      Wt Readings from Last 3 Encounters:  03/15/16 243 lb  8 oz (110.5 kg)  02/02/16 234 lb (106.1 kg)  03/16/15 249 lb 12 oz (113.3 kg)       ASSESSMENT AND PLAN:  Paroxysmal atrial fibrillation (HCC) - Plan: EKG 12-Lead Discussed various regimens for his paroxysmal atrial fibrillation He prefers to continue to take amiodarone as needed Typically he takes 2 pills when he has breakthrough atrial fibrillation/flutter Has not been taking diltiazem Recommended he call our office if he starts to have more frequent episodes  Coronary artery disease involving native coronary artery of native heart without angina pectoris Currently with no symptoms of angina. No further workup at this time.  Essential hypertension  Blood pressure elevated on today's visit. Likely from weight gain recommended he check his blood pressure at home, call our office back with some numbers Also follow-up with primary care  Mixed hyperlipidemia recommended that he add zetia daily, 10 mg Discussed ways that he can obtain this generic pricing  Type 2  diabetes mellitus with other circulatory complication, with long-term current use of insulin ([place]) Long discussion concerning his diet A1c of 8   Total encounter time more than 25 minutes  Greater than 50% was spent in counseling and coordination of care with the patient  Disposition:   F/U  12 months   Orders Placed This Encounter  Procedures  . EKG 12-Lead     Signed, Esmond Plants, M.D., Ph.D. 03/15/2016  [place], [place]

## 2016-04-06 ENCOUNTER — Ambulatory Visit (INDEPENDENT_AMBULATORY_CARE_PROVIDER_SITE_OTHER): Payer: Managed Care, Other (non HMO)

## 2016-04-06 DIAGNOSIS — I480 Paroxysmal atrial fibrillation: Secondary | ICD-10-CM | POA: Diagnosis not present

## 2016-04-06 DIAGNOSIS — Z5181 Encounter for therapeutic drug level monitoring: Secondary | ICD-10-CM

## 2016-04-06 DIAGNOSIS — I4892 Unspecified atrial flutter: Secondary | ICD-10-CM

## 2016-04-06 LAB — POCT INR: INR: 3

## 2016-05-11 ENCOUNTER — Other Ambulatory Visit: Payer: Self-pay | Admitting: Cardiovascular Disease

## 2016-05-18 ENCOUNTER — Ambulatory Visit (INDEPENDENT_AMBULATORY_CARE_PROVIDER_SITE_OTHER): Payer: Managed Care, Other (non HMO)

## 2016-05-18 DIAGNOSIS — I4892 Unspecified atrial flutter: Secondary | ICD-10-CM | POA: Diagnosis not present

## 2016-05-18 DIAGNOSIS — Z5181 Encounter for therapeutic drug level monitoring: Secondary | ICD-10-CM | POA: Diagnosis not present

## 2016-05-18 DIAGNOSIS — I480 Paroxysmal atrial fibrillation: Secondary | ICD-10-CM | POA: Diagnosis not present

## 2016-05-18 LAB — POCT INR: INR: 3

## 2016-06-15 ENCOUNTER — Other Ambulatory Visit: Payer: Self-pay | Admitting: Unknown Physician Specialty

## 2016-06-15 NOTE — Telephone Encounter (Signed)
It looks like this patient has established somewhere else in family medicine according to chart review.

## 2016-06-29 ENCOUNTER — Ambulatory Visit (INDEPENDENT_AMBULATORY_CARE_PROVIDER_SITE_OTHER): Payer: Managed Care, Other (non HMO)

## 2016-06-29 DIAGNOSIS — Z5181 Encounter for therapeutic drug level monitoring: Secondary | ICD-10-CM

## 2016-06-29 DIAGNOSIS — I480 Paroxysmal atrial fibrillation: Secondary | ICD-10-CM

## 2016-06-29 DIAGNOSIS — I4892 Unspecified atrial flutter: Secondary | ICD-10-CM

## 2016-06-29 LAB — POCT INR: INR: 2.8

## 2016-07-27 ENCOUNTER — Other Ambulatory Visit: Payer: Self-pay | Admitting: Unknown Physician Specialty

## 2016-07-27 NOTE — Telephone Encounter (Signed)
Patient has a PCP not in this office.

## 2016-08-10 ENCOUNTER — Ambulatory Visit (INDEPENDENT_AMBULATORY_CARE_PROVIDER_SITE_OTHER): Payer: Managed Care, Other (non HMO) | Admitting: *Deleted

## 2016-08-10 DIAGNOSIS — Z5181 Encounter for therapeutic drug level monitoring: Secondary | ICD-10-CM | POA: Diagnosis not present

## 2016-08-10 DIAGNOSIS — I4892 Unspecified atrial flutter: Secondary | ICD-10-CM | POA: Diagnosis not present

## 2016-08-10 DIAGNOSIS — I480 Paroxysmal atrial fibrillation: Secondary | ICD-10-CM

## 2016-08-10 LAB — POCT INR: INR: 2.9

## 2016-09-08 ENCOUNTER — Other Ambulatory Visit: Payer: Self-pay | Admitting: Cardiovascular Disease

## 2016-09-08 NOTE — Telephone Encounter (Signed)
Please review for refill thanks 

## 2016-09-20 ENCOUNTER — Other Ambulatory Visit: Payer: Self-pay | Admitting: Cardiovascular Disease

## 2016-09-21 ENCOUNTER — Ambulatory Visit (INDEPENDENT_AMBULATORY_CARE_PROVIDER_SITE_OTHER): Payer: Managed Care, Other (non HMO) | Admitting: *Deleted

## 2016-09-21 DIAGNOSIS — I480 Paroxysmal atrial fibrillation: Secondary | ICD-10-CM

## 2016-09-21 DIAGNOSIS — Z5181 Encounter for therapeutic drug level monitoring: Secondary | ICD-10-CM

## 2016-09-21 DIAGNOSIS — I4892 Unspecified atrial flutter: Secondary | ICD-10-CM

## 2016-09-21 LAB — POCT INR: INR: 4.1

## 2016-10-09 IMAGING — CR DG CHEST 2V
1 series · 1 of 1 positions shown · non-contrast
Comparison: 01/11/2013

CLINICAL DATA: Mid chest pain for 2-3 weeks. Subsequent evaluation.

EXAM:
CHEST  2 VIEW

[dxr portable chest single view]
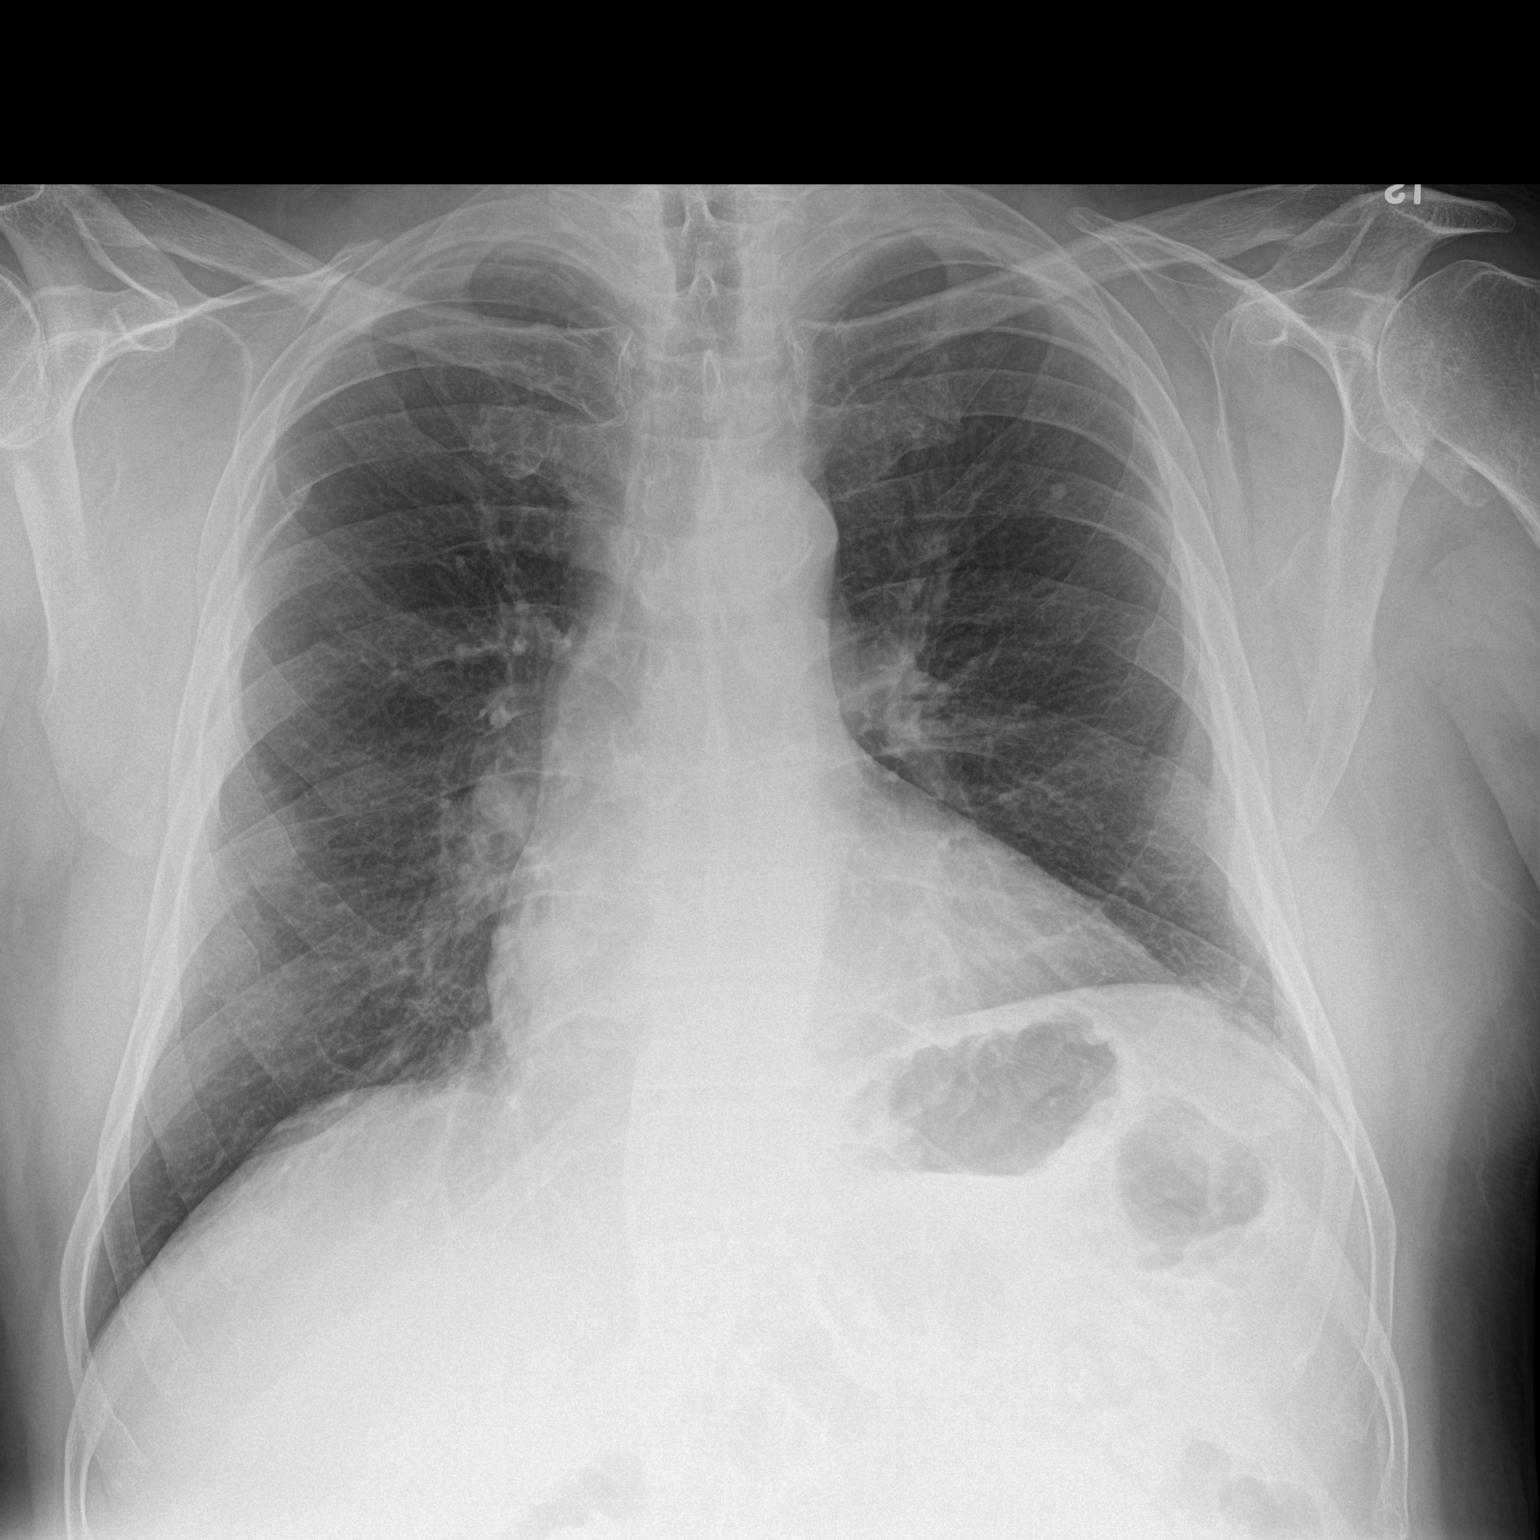

[1 of 1 positions shown; findings below may reference images not displayed]

FINDINGS: Normal mediastinum and cardiac silhouette. Normal pulmonary
vasculature. No evidence of effusion, infiltrate, or pneumothorax.
Several stable small pulmonary nodules. No acute bony abnormality.
IMPRESSION: No acute cardiopulmonary process.

## 2016-10-12 ENCOUNTER — Ambulatory Visit (INDEPENDENT_AMBULATORY_CARE_PROVIDER_SITE_OTHER): Payer: Managed Care, Other (non HMO) | Admitting: *Deleted

## 2016-10-12 DIAGNOSIS — I4892 Unspecified atrial flutter: Secondary | ICD-10-CM | POA: Diagnosis not present

## 2016-10-12 DIAGNOSIS — I480 Paroxysmal atrial fibrillation: Secondary | ICD-10-CM

## 2016-10-12 DIAGNOSIS — Z5181 Encounter for therapeutic drug level monitoring: Secondary | ICD-10-CM

## 2016-10-12 LAB — POCT INR: INR: 4.4

## 2016-10-26 ENCOUNTER — Telehealth: Payer: Self-pay | Admitting: Cardiovascular Disease

## 2016-10-26 ENCOUNTER — Ambulatory Visit (INDEPENDENT_AMBULATORY_CARE_PROVIDER_SITE_OTHER): Payer: Managed Care, Other (non HMO)

## 2016-10-26 ENCOUNTER — Encounter: Payer: Self-pay | Admitting: Physician Assistant

## 2016-10-26 ENCOUNTER — Ambulatory Visit (INDEPENDENT_AMBULATORY_CARE_PROVIDER_SITE_OTHER): Payer: Managed Care, Other (non HMO) | Admitting: Physician Assistant

## 2016-10-26 VITALS — BP 158/70 | HR 57 | Ht 71.0 in | Wt 250.2 lb

## 2016-10-26 DIAGNOSIS — E1165 Type 2 diabetes mellitus with hyperglycemia: Secondary | ICD-10-CM

## 2016-10-26 DIAGNOSIS — R0683 Snoring: Secondary | ICD-10-CM

## 2016-10-26 DIAGNOSIS — Z5181 Encounter for therapeutic drug level monitoring: Secondary | ICD-10-CM | POA: Diagnosis not present

## 2016-10-26 DIAGNOSIS — M7989 Other specified soft tissue disorders: Secondary | ICD-10-CM

## 2016-10-26 DIAGNOSIS — Z79899 Other long term (current) drug therapy: Secondary | ICD-10-CM | POA: Diagnosis not present

## 2016-10-26 DIAGNOSIS — I2510 Atherosclerotic heart disease of native coronary artery without angina pectoris: Secondary | ICD-10-CM

## 2016-10-26 DIAGNOSIS — I480 Paroxysmal atrial fibrillation: Secondary | ICD-10-CM

## 2016-10-26 DIAGNOSIS — I10 Essential (primary) hypertension: Secondary | ICD-10-CM | POA: Diagnosis not present

## 2016-10-26 DIAGNOSIS — E782 Mixed hyperlipidemia: Secondary | ICD-10-CM

## 2016-10-26 DIAGNOSIS — I4892 Unspecified atrial flutter: Secondary | ICD-10-CM | POA: Diagnosis not present

## 2016-10-26 DIAGNOSIS — R011 Cardiac murmur, unspecified: Secondary | ICD-10-CM

## 2016-10-26 LAB — POCT INR: INR: 3.9

## 2016-10-26 NOTE — Patient Instructions (Signed)
Medication Instructions:  Your physician recommends that you continue on your current medications as directed. Please refer to the Current Medication list given to you today.   Labwork: Your physician recommends that you return for lab work in: TODAY (CMET, CBC, TSH, Mg).   Testing/Procedures: Your physician has requested that you have an echocardiogram. Echocardiography is a painless test that uses sound waves to create images of your heart. It provides your doctor with information about the size and shape of your heart and how well your heart's chambers and valves are working. This procedure takes approximately one hour. There are no restrictions for this procedure.  Your physician has recommended that you wear an 14 DAY ZIO event monitor. Event monitors are medical devices that record the heart's electrical activity. Doctors most often [place] these monitors to diagnose arrhythmias. Arrhythmias are problems with the speed or rhythm of the heartbeat. The monitor is a small, portable device. You can wear one while you do your normal daily activities. This is usually used to diagnose what is causing palpitations/syncope (passing out).  A Zio Patch Event Heart monitor will be applied to your chest today.  You will wear the patch for 14 days. After 24 hours, you may shower with the heart monitor on. If you feel any SYMPTOMS, you may press and release the button in the middle of the monitor. REMOVE ON 11/09/16.     Follow-Up: You have been referred to PULMONOLOGY FOR SLEEP STUDY.  Your physician recommends that you schedule a follow-up appointment in: 3-4 [place].   If you need a refill on your cardiac medications before your next appointment, please call your pharmacy.   Echocardiogram An echocardiogram, or echocardiography, uses sound waves (ultrasound) to produce an image of your heart. The echocardiogram is simple, painless, obtained within a short period of time, and offers  valuable information to your health care provider. The images from an echocardiogram can provide information such as:  Evidence of coronary artery disease (CAD).  Heart size.  Heart muscle function.  Heart valve function.  Aneurysm detection.  Evidence of a past heart attack.  Fluid buildup around the heart.  Heart muscle thickening.  Assess heart valve function.  Tell a health care provider about:  Any allergies you have.  All medicines you are taking, including vitamins, herbs, eye drops, creams, and over-the-counter medicines.  Any problems you or family members have had with anesthetic medicines.  Any blood disorders you have.  Any surgeries you have had.  Any medical conditions you have.  Whether you are pregnant or may be pregnant. What happens before the procedure? No special preparation is needed. Eat and drink normally. What happens during the procedure?  In order to produce an image of your heart, gel will be applied to your chest and a wand-like tool (transducer) will be moved over your chest. The gel will help transmit the sound waves from the transducer. The sound waves will harmlessly bounce off your heart to allow the heart images to be captured in real-time motion. These images will then be recorded.  You may need an IV to receive a medicine that improves the quality of the pictures. What happens after the procedure? You may return to your normal schedule including diet, activities, and medicines, unless your health care provider tells you otherwise. This information is not intended to replace advice given to you by your health care provider. Make sure you discuss any questions you have with your health care  provider. Document Released: 03/18/2000 Document Revised: 11/07/2015 Document Reviewed: 11/26/2012 Elsevier Interactive Patient Education  2017 [employer].

## 2016-10-26 NOTE — Telephone Encounter (Signed)
Christell Faith, PA has an opening this afternoon. Would he like to come in today?

## 2016-10-26 NOTE — Progress Notes (Signed)
Cardiology Office Note Date:  10/26/2016  Patient ID:  Douglas Hunter, Douglas Hunter 01-29-1956, MRN [id number] PCP:  Sherrin Daisy, MD  Cardiologist:  Dr. Rockey Situ, MD    Chief Complaint: Palpitations  History of Present Illness: Douglas Hunter is a 61 y.o. male with history of CAD s/p NSTEMI in 08/2011 with medical management advised as detailed below, PAF/flutter s/p prior Afib ablation in 01/2013 by Dr. Rayann Heman, MD on Coumadin, poorly controlled DM2, HTN, HLD, snoring with probable sleep apnea without prior sleep study, obesity, RBBB, and GERD who presents for evaluation of palpitations.   Admitted to hospital in 08/2011 with NSTEMI. He underwent LHC that showed severe disease in the LCx with collaterals. EF was normal. The LCx lesion was high-risk for intervention due to a steep angulation coming from the left main coronary artery as well as heavy calcifications. The case was discussed at cath conference with consensus of medical therapy advised. No ischemic evaluations since. He has had multiple episodes of Afib/flutter over the years along with hospital admissions requiring cardioversion. Previous Afib ablation as above. Now on prn amiodarone, prn short-acting diltiazem, Lopressor bid, and Coumadin daily. He was most recently seen by Dr. Rockey Situ on 03/15/16 and reported 3 episode of palpitations at that time lasting from < 1 hour to a few hours. Taking prn medications as above at that time. He has been continued on dual therapy with Coumadin and ASA. Has previously refused ILR for palpitations. TTE from 08/2012 showed EF 55-65%, mild concentric LVH, no RWMA, normal LV diastolic function parameters, mildly dilated left atrium. TEE from 01/2013 showed EF 55-65%, no RWMA, mild MR, mildly dilated left atrium, no valvular vegetations, no PFO, no evidence of LAA or LA thrombus.  Patient called the office on 7/25 indicating he was having intermittent palpitations. Patient reports over the past 1-2 months he has  had 3 episodes of palpitations that he describes as his "Afib." Episodes will last 2-3 hours after taking prn amiodarone. He has not been taking any prn diltiazem. No recent illnesses, pains, trauma, increased stress, vomiting, or diarrhea. He has also noted a slow swelling of the bilateral lower extremities. No orthopnea, early satiety, cough, or PND. No chest pain. He continues to take Lopressor 25 mg bid and Coumadin daily. His most recent episode of palpitations was on 7/22 around 10:30 PM and lasted until ~ 1 AM with resolution following amiodarone 400 mg x 1 followed by 200 mg x 1 around 12:30 AM. No episodes since. He also notes significant daytime somnolence and does not feel well rested after a sleeping overnight.   Documented INR readings have been therapeutic to supratherapeutic dating back to 02/10/2016, with most recent reading of 3.9 on 10/26/2016. He is followed by our [facility].   Most recent labs from 05/2016 show potassium 4.8, SCr 1.0, glucose 363, Na 136, WBC 8.1, HGB 13.6 (baseline ~ 13), PLT 229, A1c 8.6, LDL 54.   Past Medical History:  Diagnosis Date  . Atrial flutter ([place])   . Coronary artery disease 08/2011   a. NSTEMI 08/2011, LHC w/ severe disease of LCx with good collaterals, lesion was high-risk for intervention due to a steep angulation of the LCx coming from the left main coronary artery as well as heavy calcifications, discused at cath conference w/ consensus advising med Rx  . GERD (gastroesophageal reflux disease)   . Hyperlipidemia   . Hypertension   . Paroxysmal atrial fibrillation ([place])    a. s/p ablation 01/2013; b.  prn amio/dilt; c. CHADS2VASc => 3 (HTN, DM, vascular disease); d. on Coumadin  . Poorly controlled diabetes mellitus ([place])   . RBBB     Past Surgical History:  Procedure Laterality Date  . ABLATION  01/29/2013   PVI and CTI by Dr Rayann Heman for atrial flutter and paroxysmal atrial fibrillation  . ATRIAL FIBRILLATION ABLATION N/A 01/29/2013    Procedure: ATRIAL FIBRILLATION ABLATION;  Surgeon: Coralyn Mark, MD;  Location: [facility];  Service: Cardiovascular;  Laterality: N/A;  . CARDIAC CATHETERIZATION  08/03/2011  . COLONOSCOPY WITH PROPOFOL N/A 02/02/2016   Procedure: COLONOSCOPY WITH PROPOFOL;  Surgeon: Lucilla Lame, MD;  Location: [facility] ENDOSCOPY;  Service: Endoscopy;  Laterality: N/A;  . TEE WITHOUT CARDIOVERSION N/A 01/28/2013   Procedure: TRANSESOPHAGEAL ECHOCARDIOGRAM (TEE);  Surgeon: Lelon Perla, MD;  Location: [facility] ENDOSCOPY;  Service: Cardiovascular;  Laterality: N/A;    Current Meds  Medication Sig  . Acetaminophen 500 MG coapsule Take by mouth.  Marland Kitchen amiodarone (PACERONE) 200 MG tablet Take 1 tablet (200 mg total) by mouth 2 (two) times daily as needed (AFIB).  Marland Kitchen aspirin EC 81 MG tablet Take 81 mg by mouth daily.  Marland Kitchen atorvastatin (LIPITOR) 80 MG tablet   . benazepril (LOTENSIN) 40 MG tablet TAKE 1 TABLET(40 MG) BY MOUTH DAILY  . diltiazem (CARDIZEM) 30 MG tablet Take 1 tablet (30 mg total) by mouth 3 (three) times daily as needed (heart rate increases).  . ezetimibe (ZETIA) 10 MG tablet Take 1 tablet (10 mg total) by mouth daily.  . insulin NPH-regular Human (HUMULIN 70/30) (70-30) 100 UNIT/ML injection Inject 60 Units into the skin 2 (two) times daily with a meal.  . INSULIN SYRINGE 1CC/29G 29G X 1/2" 1 ML MISC USE AS DIRECTED  . isosorbide mononitrate (IMDUR) 30 MG 24 hr tablet TAKE 1 TABLET BY MOUTH EVERY DAY.  . metFORMIN (GLUCOPHAGE) 500 MG tablet TAKE 1 TABLET(500 MG) BY MOUTH FOUR TIMES DAILY  . metoprolol tartrate (LOPRESSOR) 25 MG tablet TAKE 1 TABLET BY MOUTH TWICE DAILY  . nitroGLYCERIN (NITROSTAT) 0.4 MG SL tablet Place 1 tablet (0.4 mg total) under the tongue every 5 (five) minutes as needed for chest pain.  Marland Kitchen omeprazole (PRILOSEC) 20 MG capsule Take 1 capsule (20 mg total) by mouth 2 (two) times daily before a meal.  . ONE TOUCH ULTRA TEST test strip TEST THREE TIMES A DAY AS DIRECTED.  Marland Kitchen VIAGRA 100 MG  tablet TAKE 1 TABLET BY MOUTH EVERY DAY AS NEEDED  . warfarin (COUMADIN) 5 MG tablet TAKE 1 TABLET(5 MG) BY MOUTH DAILY    Allergies:   Patient has no known allergies.   Social History:  The patient  reports that he quit smoking about 8 years ago. His smoking use included Cigarettes. He has a 52.50 pack-year smoking history. He has never used smokeless tobacco. He reports that he drinks alcohol. He reports that he does not use drugs.   Family History:  The patient's family history includes Cancer in his father; Diabetes in his son; Heart attack in his maternal uncle; Heart attack (age of onset: 67) in his father; Heart disease in his brother, mother, and paternal grandfather; Stroke in his mother.  ROS:   Review of Systems  Constitutional: Positive for malaise/fatigue. Negative for chills, diaphoresis, fever and weight loss.  HENT: Negative for congestion.   Eyes: Negative for discharge and redness.  Respiratory: Negative for cough, hemoptysis, sputum production, shortness of breath and wheezing.   Cardiovascular: Positive for palpitations  and leg swelling. Negative for chest pain, orthopnea, claudication and PND.  Gastrointestinal: Negative for abdominal pain, blood in stool, heartburn, melena, nausea and vomiting.  Genitourinary: Negative for hematuria.  Musculoskeletal: Negative for falls and myalgias.  Skin: Negative for rash.  Neurological: Positive for weakness. Negative for dizziness, tingling, tremors, sensory change, speech change, focal weakness and loss of consciousness.  Endo/Heme/Allergies: Does not bruise/bleed easily.  Psychiatric/Behavioral: Negative for substance abuse. The patient is not nervous/anxious.   All other systems reviewed and are negative.    PHYSICAL EXAM:  VS:  BP (!) 158/70 (BP Location: Left Arm, Patient Position: Sitting, Cuff Size: Normal)   Pulse (!) 57   Ht 5\' 11"  (1.803 m)   Wt 250 lb 4 oz (113.5 kg)   BMI 34.90 kg/m  BMI: Body mass index is 34.9  kg/m.  Physical Exam  Constitutional: He is oriented to person, place, and time. He appears well-developed and well-nourished.  HENT:  Head: Normocephalic and atraumatic.  Eyes: Right eye exhibits no discharge. Left eye exhibits no discharge.  Neck: Normal range of motion. No JVD present.  Cardiovascular: Regular rhythm, S1 normal and S2 normal.  Bradycardia present.  Exam reveals no distant heart sounds, no friction rub, no midsystolic click and no opening snap.   Murmur heard.  Systolic murmur is present with a grade of 2/6  at the upper left sternal border Pulmonary/Chest: Effort normal and breath sounds normal. No respiratory distress. He has no decreased breath sounds. He has no wheezes. He has no rales. He exhibits no tenderness.  Abdominal: Soft. He exhibits no distension. There is no tenderness.  Musculoskeletal: He exhibits edema.  Trace pre-tibial edema bilaterally.   Neurological: He is alert and oriented to person, place, and time.  Skin: Skin is warm and dry. No cyanosis. Nails show no clubbing.  Psychiatric: He has a normal mood and affect. His speech is normal and behavior is normal. Judgment and thought content normal.     EKG:  Was ordered and interpreted by me today. Shows sinus bradycardia, 57 bpm, RBBB, no acute st/t changes  Recent Labs: Per HPI.  Wt Readings from Last 3 Encounters:  10/26/16 250 lb 4 oz (113.5 kg)  03/15/16 243 lb 8 oz (110.5 kg)  02/02/16 234 lb (106.1 kg)     Other studies reviewed: Additional studies/records reviewed today include: summarized above  ASSESSMENT AND PLAN:  1. PAF: He remains in sinus rhythm with a bradycardic rate at this time. He does indicate 3 separate episodes of palpitations that he describes as Afib over the past 1-2 months. These episodes are short-lived, lasting ~ a few hours and resolving with prn amiodarone. His heart rate precludes titration of Lopressor at this time or addition of Cardizem for added rate  control. I do think he is a bit too young to go on long term amiodarone at this time. He is not a candidate for flecainide given his history of CAD with prior NSTEMI. Moving forward, Multaq vs Tikosyn could be an option if he would like. For now, he would like to evaluate for underlying sleep apnea via a sleep study, along with checking an echo and labs to include cmet, tsh, magnesium, and cbc. I do think it is reasonable to start with the above evaluation, as if he is found to have sleep apnea, we would want to treat that prior to initiating AAT. Antiarrhythmic therapy is unlikely to be successful long term with underlying poorly controlled sleep apnea. Weight  loss would also help with his Afib burden. We will check a 14-day Zio monitor to evaluate his Afib burden. Consider ILR. Continue Coumadin for now as his insurance does not cover DOACs. CHADS2VASC at least 3 (HTN, DM, vascular disease).   2. CAD in native artery: No symptoms concerning for angina at this time. On dual therapy with ASA 81 mg daily and Coumadin per primary cardiologist. Will defer long term management of his dual therapy to primary cardiologist. Continue Lopressor and Lipitor. No indication for ischemia work up at this time.   3. Lower extremity swelling: Slowly progressing. Check TTE. Elevate legs. Compression stockings. Will not add diuretic at this time until we evaluate his echo. Has not been taking diltiazem.   4. Cardiac murmur: Check TTE as above.   5. HTN: BP is modestly elevated today. He is slightly stressed. If BP readings continue to be elevated, would suggest addition of ACEi/ARB to a goal SBP < 120 mmHg. Continue Lopressor. Heart rate of 57 bpm precludes titration of this medication at this time.   6. HLD: Continue Lipitor. Recent LDL per HPI.  7. Poorly controlled DM: Per PCP.  8. Snoring/probable sleep apnea/obesity: Suspect this is playing a significant role in his Afib/palpitations. Discussed the long term  healthy ramifications of poorly treated sleep apnea. He agrees to pulmonology referral for sleep study.   9. High risk medication use: Previously on Xarelto, though had to change to Coumadin as he had to go on his wife's insurance. He does continue to take amiodarone prn. I will defer long term management of this medication to his primary cardiologist.   Disposition: F/u with me in 3-4 weeks.    Current medicines are reviewed at length with the patient today.  The patient did not have any concerns regarding medicines.  Melvern Banker PA-C 10/26/2016 1:57 PM     [address] [place], [place] [address] [phone]

## 2016-10-26 NOTE — Telephone Encounter (Signed)
Pt came in to office stated he was symptoms of a fib for the last month Twice this month  Scheduled appt for Sept  Would like to be seen sooner, please call.

## 2016-10-27 LAB — COMPREHENSIVE METABOLIC PANEL
ALT: 30 IU/L (ref 0–44)
AST: 36 IU/L (ref 0–40)
Albumin/Globulin Ratio: 1.6 (ref 1.2–2.2)
Albumin: 4.2 g/dL (ref 3.6–4.8)
Alkaline Phosphatase: 93 IU/L (ref 39–117)
BUN/Creatinine Ratio: 15 (ref 10–24)
BUN: 18 mg/dL (ref 8–27)
Bilirubin Total: 0.7 mg/dL (ref 0.0–1.2)
CALCIUM: 9.1 mg/dL (ref 8.6–10.2)
CO2: 18 mmol/L — AB (ref 20–29)
CREATININE: 1.18 mg/dL (ref 0.76–1.27)
Chloride: 104 mmol/L (ref 96–106)
GFR calc Af Amer: 76 mL/min/{1.73_m2} (ref >59)
GFR, EST NON AFRICAN AMERICAN: 66 mL/min/{1.73_m2} (ref >59)
GLOBULIN, TOTAL: 2.7 g/dL (ref 1.5–4.5)
GLUCOSE: 160 mg/dL — AB (ref 65–99)
Potassium: 4.5 mmol/L (ref 3.5–5.2)
Sodium: 140 mmol/L (ref 134–144)
Total Protein: 6.9 g/dL (ref 6.0–8.5)

## 2016-10-27 LAB — CBC WITH DIFFERENTIAL/PLATELET
BASOS ABS: 0 10*3/uL (ref 0.0–0.2)
Basos: 0 %
EOS (ABSOLUTE): 0.3 10*3/uL (ref 0.0–0.4)
EOS: 3 %
HEMATOCRIT: 38.9 % (ref 37.5–51.0)
Hemoglobin: 13.1 g/dL (ref 13.0–17.7)
IMMATURE GRANULOCYTES: 0 %
Immature Grans (Abs): 0 10*3/uL (ref 0.0–0.1)
Lymphocytes Absolute: 2 10*3/uL (ref 0.7–3.1)
Lymphs: 24 %
MCH: 31.3 pg (ref 26.6–33.0)
MCHC: 33.7 g/dL (ref 31.5–35.7)
MCV: 93 fL (ref 79–97)
Monocytes Absolute: 0.5 10*3/uL (ref 0.1–0.9)
Monocytes: 6 %
NEUTROS PCT: 67 %
Neutrophils Absolute: 5.5 10*3/uL (ref 1.4–7.0)
PLATELETS: 255 10*3/uL (ref 150–379)
RBC: 4.18 x10E6/uL (ref 4.14–5.80)
RDW: 13.8 % (ref 12.3–15.4)
WBC: 8.4 10*3/uL (ref 3.4–10.8)

## 2016-10-27 LAB — TSH: TSH: 4.19 u[IU]/mL (ref 0.450–4.500)

## 2016-10-27 LAB — MAGNESIUM: MAGNESIUM: 1.7 mg/dL (ref 1.6–2.3)

## 2016-10-28 ENCOUNTER — Other Ambulatory Visit: Payer: Managed Care, Other (non HMO)

## 2016-11-09 ENCOUNTER — Ambulatory Visit (INDEPENDENT_AMBULATORY_CARE_PROVIDER_SITE_OTHER): Payer: Managed Care, Other (non HMO)

## 2016-11-09 DIAGNOSIS — Z5181 Encounter for therapeutic drug level monitoring: Secondary | ICD-10-CM | POA: Diagnosis not present

## 2016-11-09 DIAGNOSIS — I4892 Unspecified atrial flutter: Secondary | ICD-10-CM | POA: Diagnosis not present

## 2016-11-09 DIAGNOSIS — I480 Paroxysmal atrial fibrillation: Secondary | ICD-10-CM

## 2016-11-09 LAB — POCT INR: INR: 2.6

## 2016-11-14 ENCOUNTER — Other Ambulatory Visit: Payer: Self-pay

## 2016-11-14 ENCOUNTER — Ambulatory Visit (INDEPENDENT_AMBULATORY_CARE_PROVIDER_SITE_OTHER): Payer: Managed Care, Other (non HMO)

## 2016-11-14 DIAGNOSIS — M7989 Other specified soft tissue disorders: Secondary | ICD-10-CM

## 2016-11-14 DIAGNOSIS — I480 Paroxysmal atrial fibrillation: Secondary | ICD-10-CM

## 2016-11-28 ENCOUNTER — Ambulatory Visit (INDEPENDENT_AMBULATORY_CARE_PROVIDER_SITE_OTHER): Payer: 59

## 2016-11-28 ENCOUNTER — Encounter: Payer: Self-pay | Admitting: Internal Medicine

## 2016-11-28 ENCOUNTER — Encounter: Payer: Self-pay | Admitting: Physician Assistant

## 2016-11-28 ENCOUNTER — Ambulatory Visit (INDEPENDENT_AMBULATORY_CARE_PROVIDER_SITE_OTHER): Payer: 59 | Admitting: Physician Assistant

## 2016-11-28 ENCOUNTER — Ambulatory Visit (INDEPENDENT_AMBULATORY_CARE_PROVIDER_SITE_OTHER): Payer: 59 | Admitting: Internal Medicine

## 2016-11-28 VITALS — BP 144/60 | HR 56 | Ht 71.0 in | Wt 250.2 lb

## 2016-11-28 VITALS — BP 142/70 | HR 54 | Ht 71.0 in | Wt 250.0 lb

## 2016-11-28 DIAGNOSIS — I480 Paroxysmal atrial fibrillation: Secondary | ICD-10-CM | POA: Diagnosis not present

## 2016-11-28 DIAGNOSIS — I2510 Atherosclerotic heart disease of native coronary artery without angina pectoris: Secondary | ICD-10-CM | POA: Diagnosis not present

## 2016-11-28 DIAGNOSIS — E782 Mixed hyperlipidemia: Secondary | ICD-10-CM

## 2016-11-28 DIAGNOSIS — M7989 Other specified soft tissue disorders: Secondary | ICD-10-CM

## 2016-11-28 DIAGNOSIS — I4892 Unspecified atrial flutter: Secondary | ICD-10-CM

## 2016-11-28 DIAGNOSIS — Z79899 Other long term (current) drug therapy: Secondary | ICD-10-CM | POA: Diagnosis not present

## 2016-11-28 DIAGNOSIS — I10 Essential (primary) hypertension: Secondary | ICD-10-CM

## 2016-11-28 DIAGNOSIS — Z5181 Encounter for therapeutic drug level monitoring: Secondary | ICD-10-CM | POA: Diagnosis not present

## 2016-11-28 DIAGNOSIS — G4719 Other hypersomnia: Secondary | ICD-10-CM

## 2016-11-28 LAB — POCT INR: INR: 2.6

## 2016-11-28 MED ORDER — FUROSEMIDE 20 MG PO TABS: 20.0000 mg | ORAL_TABLET | Freq: Every day | ORAL | 3 refills | Status: DC

## 2016-11-28 NOTE — Progress Notes (Signed)
Cardiology Office Note Date:  11/28/2016  Patient ID:  Douglas Hunter 04-15-1955, MRN [id number] PCP:  Sherrin Daisy, MD  Cardiologist:  Dr. Rockey Situ, MD    Chief Complaint: Follow up Afib  History of Present Illness: Douglas Hunter is a 61 y.o. male with history of CAD s/p NSTEMI in 08/2011 with medical management advised as detailed below, PAF/flutter s/p prior Afib ablation in 01/2013 by Dr. Rayann Heman, MD on Coumadin, poorly controlled DM2, HTN, HLD, snoring with probable sleep apnea without prior sleep study, obesity, RBBB, and GERD who presents for follow up of PAF.    Admitted to hospital in 08/2011 with NSTEMI. He underwent LHC that showed severe disease in the LCx with collaterals. EF was normal. The LCx lesion was high-risk for intervention due to a steep angulation coming from the left main coronary artery as well as heavy calcifications. The case was discussed at cath conference with consensus of medical therapy advised. No ischemic evaluations since. He has had multiple episodes of Afib/flutter over the years along with hospital admissions requiring cardioversion. Previous Afib ablation as above. Now on prn amiodarone, prn short-acting diltiazem, Lopressor bid, and Coumadin daily. He was seen by Dr. Rockey Situ on 03/15/16 and reported 3 episodes of palpitations at that time lasting from < 1 hour to a few hours. Taking prn medications as above at that time. He had been continued on dual therapy with Coumadin and ASA. Has previously refused ILR for palpitations. TTE from 08/2012 showed EF 55-65%, mild concentric LVH, no RWMA, normal LV diastolic function parameters, mildly dilated left atrium. TEE from 01/2013 showed EF 55-65%, no RWMA, mild MR, mildly dilated left atrium, no valvular vegetations, no PFO, no evidence of LAA or LA thrombus. Patient was seen on 7/25 with palpitations over the prior 1-2 months that he described as his "Afib." Episodes would last 2-3 hours after taking prn  amiodarone. Sleep study was scheduled given possible underlying sleep apnea, which is pending at this time. Labs showed a mildly low magnesium at 1.7, potassium at goal, normal tsh, renal and liver function. Echo on 11/14/16 showed EF 55-60%, normal wall motion, normal LV diastolic function, calcified mitral annulus, mildly dilated left atrium, RV systolic function normal, PASP mildly elevated at 39 mmHg. He was noted to be sinus bradycardia with a heart rate of 47 bpm. Zio monitor currently pending. INR has remained therapeutic.  Patient comes in doing well today. He does indicate a brief episode of palpitations/self-reported A. fib on 8/22 while [place] vacation at the beach. He reports he had several beers that day and feels like the association of palpitations and alcohol is significant for him. He reports he has now cut out all alcohol. This episode of palpitations on 8/22 lasted approximately 1-2 hours and resolved with when necessary amiodarone. He has not missed any doses of Coumadin. Blood pressure continues to run in the 409-811 systolic range. No chest pain. He has an appointment to get his sleep study scheduled later this afternoon. He does not have any other concerns at this time.   Past Medical History:  Diagnosis Date  . Atrial flutter ([place])   . Coronary artery disease 08/2011   a. NSTEMI 08/2011, LHC w/ severe disease of LCx with good collaterals, lesion was high-risk for intervention due to a steep angulation of the LCx coming from the left main coronary artery as well as heavy calcifications, discused at cath conference w/ consensus advising med Rx  . GERD (gastroesophageal reflux disease)   .  Hyperlipidemia   . Hypertension   . Paroxysmal atrial fibrillation ([place])    a. s/p ablation 01/2013; b. prn amio/dilt; c. CHADS2VASc => 3 (HTN, DM, vascular disease); d. on Coumadin  . Poorly controlled diabetes mellitus ([place])   . RBBB     Past Surgical History:  Procedure Laterality Date  .  ABLATION  01/29/2013   PVI and CTI by Dr Rayann Heman for atrial flutter and paroxysmal atrial fibrillation  . ATRIAL FIBRILLATION ABLATION N/A 01/29/2013   Procedure: ATRIAL FIBRILLATION ABLATION;  Surgeon: Coralyn Mark, MD;  Location: [facility] CATH LAB;  Service: Cardiovascular;  Laterality: N/A;  . CARDIAC CATHETERIZATION  08/03/2011  . COLONOSCOPY WITH PROPOFOL N/A 02/02/2016   Procedure: COLONOSCOPY WITH PROPOFOL;  Surgeon: Lucilla Lame, MD;  Location: [facility] ENDOSCOPY;  Service: Endoscopy;  Laterality: N/A;  . TEE WITHOUT CARDIOVERSION N/A 01/28/2013   Procedure: TRANSESOPHAGEAL ECHOCARDIOGRAM (TEE);  Surgeon: Lelon Perla, MD;  Location: [facility] ENDOSCOPY;  Service: Cardiovascular;  Laterality: N/A;    Current Meds  Medication Sig  . Acetaminophen 500 MG coapsule Take by mouth.  Marland Kitchen amiodarone (PACERONE) 200 MG tablet Take 1 tablet (200 mg total) by mouth 2 (two) times daily as needed (AFIB).  Marland Kitchen aspirin EC 81 MG tablet Take 81 mg by mouth daily.  Marland Kitchen atorvastatin (LIPITOR) 80 MG tablet Take 80 mg by mouth daily at 6 PM.   . benazepril (LOTENSIN) 40 MG tablet TAKE 1 TABLET(40 MG) BY MOUTH DAILY  . diltiazem (CARDIZEM) 30 MG tablet Take 1 tablet (30 mg total) by mouth 3 (three) times daily as needed (heart rate increases).  . ezetimibe (ZETIA) 10 MG tablet Take 1 tablet (10 mg total) by mouth daily.  . insulin NPH-regular Human (HUMULIN 70/30) (70-30) 100 UNIT/ML injection Inject 60 Units into the skin 2 (two) times daily with a meal.  . INSULIN SYRINGE 1CC/29G 29G X 1/2" 1 ML MISC USE AS DIRECTED  . isosorbide mononitrate (IMDUR) 30 MG 24 hr tablet TAKE 1 TABLET BY MOUTH EVERY DAY.  . metFORMIN (GLUCOPHAGE) 500 MG tablet TAKE 1 TABLET(500 MG) BY MOUTH FOUR TIMES DAILY (Patient taking differently: TAKE 1 TABLET(500 MG) BY MOUTH TWO TIMES DAILY)  . metoprolol tartrate (LOPRESSOR) 25 MG tablet TAKE 1 TABLET BY MOUTH TWICE DAILY  . nitroGLYCERIN (NITROSTAT) 0.4 MG SL tablet Place 1 tablet (0.4 mg total) under  the tongue every 5 (five) minutes as needed for chest pain.  Marland Kitchen omeprazole (PRILOSEC) 20 MG capsule Take 1 capsule (20 mg total) by mouth 2 (two) times daily before a meal. (Patient taking differently: Take 20 mg by mouth daily. )  . ONE TOUCH ULTRA TEST test strip TEST THREE TIMES A DAY AS DIRECTED.  Marland Kitchen VIAGRA 100 MG tablet TAKE 1 TABLET BY MOUTH EVERY DAY AS NEEDED  . warfarin (COUMADIN) 5 MG tablet TAKE 1 TABLET(5 MG) BY MOUTH DAILY    Allergies:   Patient has no known allergies.   Social History:  The patient  reports that he quit smoking about 8 years ago. His smoking use included Cigarettes. He has a 52.50 pack-year smoking history. He has never used smokeless tobacco. He reports that he drinks alcohol. He reports that he does not use drugs.   Family History:  The patient's family history includes Cancer in his father; Diabetes in his son; Heart attack in his maternal uncle; Heart attack (age of onset: 36) in his father; Heart disease in his brother, mother, and paternal grandfather; Stroke in his mother.  ROS:   Review of Systems  Constitutional: Positive for malaise/fatigue. Negative for chills, diaphoresis, fever and weight loss.  HENT: Negative for congestion.   Eyes: Negative for discharge and redness.  Respiratory: Negative for cough, hemoptysis, sputum production, shortness of breath and wheezing.   Cardiovascular: Positive for palpitations and leg swelling. Negative for chest pain, orthopnea, claudication and PND.  Gastrointestinal: Negative for abdominal pain, blood in stool, heartburn, melena, nausea and vomiting.  Genitourinary: Negative for hematuria.  Musculoskeletal: Negative for falls and myalgias.  Skin: Negative for rash.  Neurological: Negative for dizziness, tingling, tremors, sensory change, speech change, focal weakness, loss of consciousness and weakness.  Endo/Heme/Allergies: Does not bruise/bleed easily.  Psychiatric/Behavioral: Negative for substance abuse. The  patient is not nervous/anxious.   All other systems reviewed and are negative.    PHYSICAL EXAM:  VS:  BP (!) 144/60 (BP Location: Left Arm, Patient Position: Sitting, Cuff Size: Normal)   Pulse (!) 56   Ht 5\' 11"  (1.803 m)   Wt 250 lb 4 oz (113.5 kg)   BMI 34.90 kg/m  BMI: Body mass index is 34.9 kg/m.  Physical Exam  Constitutional: He is oriented to person, place, and time. He appears well-developed and well-nourished.  HENT:  Head: Normocephalic and atraumatic.  Eyes: Right eye exhibits no discharge. Left eye exhibits no discharge.  Neck: Normal range of motion. No JVD present.  Cardiovascular: Regular rhythm, S1 normal and S2 normal.  Bradycardia present.  Exam reveals no distant heart sounds, no friction rub, no midsystolic click and no opening snap.   Murmur heard. High-pitched blowing holosystolic murmur is present with a grade of 1/6  at the apex Pulmonary/Chest: Effort normal and breath sounds normal. No respiratory distress. He has no decreased breath sounds. He has no wheezes. He has no rales. He exhibits no tenderness.  Abdominal: Soft. He exhibits no distension. There is no tenderness.  Musculoskeletal: He exhibits edema.  Trace bilateral pretibial edema  Neurological: He is alert and oriented to person, place, and time.  Skin: Skin is warm and dry. No cyanosis. Nails show no clubbing.  Psychiatric: He has a normal mood and affect. His speech is normal and behavior is normal. Judgment and thought content normal.     EKG:  Was ordered and interpreted by me today. Shows sinus bradycardia, 56 bpm, right bundle branch block  Recent Labs: 10/26/2016: ALT 30; BUN 18; Creatinine, Ser 1.18; Hemoglobin 13.1; Magnesium 1.7; Platelets 255; Potassium 4.5; Sodium 140; TSH 4.190  No results found for requested labs within last 8760 hours.   CrCl cannot be calculated (Patient's most recent lab result is older than the maximum 21 days allowed.).   Wt Readings from Last 3  Encounters:  11/28/16 250 lb 4 oz (113.5 kg)  10/26/16 250 lb 4 oz (113.5 kg)  03/15/16 243 lb 8 oz (110.5 kg)     Other studies reviewed: Additional studies/records reviewed today include: summarized above  ASSESSMENT AND PLAN:  1. PAF: Remains in sinus rhythm with bradycardic heart rate at this time. Asymptomatic. He does indicate one episode of palpitations that he describes as A. fib on 8/22. This occurred after an episode of alcohol. He has since stated he will cut out any further alcohol. He is getting his sleep study scheduled later this afternoon. Continues to take Lopressor twice a day without issues. His episode of palpitations on 8/22 resolved with when necessary amiodarone and lasted approximately one to 2 hours. He has not missed any doses  of Coumadin. He is not a great candidate for long-term, standing amiodarone given his young age. He is not a candidate for flecainide given his history of CAD with prior non-STEMI. Moving forward, could consider Multaq versus Tikosyn, though would need to evaluate for underlying uncontrolled sleep apnea via sleep study prior to initiating antiarrhythmic therapy agree with his plan to discontinue alcohol. Awake 14 days Zio monitor. CHADS2VASC at least 3 (HTN, DM, vascular disease).   2. CAD in native artery: No symptoms concerning for angina at this time. On dual therapy with ASA 81 mg daily and Coumadin per primary cardiologist. Will defer long-term management of his dual therapy to primary cardiologist. Continue Lopressor and Lipitor. No indication for ischemic workup at this time.  3. Lower extremity swelling/likely venous stasis: Stable. Transthoracic echocardiogram as above. Elevate legs. He is hesitant to wear compression stockings at this time as his job is mostly outdoors. Start low-dose Lasix 20 mg daily today. Check bmet today. Follow-up bmet in one week. He has not been taking any of the when necessary diltiazem.  4. Hypertension: Remains  mildly elevated. Add Lasix as above. Continue benazepril, Imdur, and Lopressor.  5. Hyperlipidemia: Continue Lipitor.  6. Snoring/probable sleep apnea/obesity/elevated right-sided heart pressure: Sleep study pending. Will need sleep study evaluation prior to any potential antiarrhythmic therapy.  7. High-risk medication use: Taking amiodarone when necessary. Recent liver and thyroid function normal. Recommend annual eye exams and routine pulmonology evaluation. Previously on Xarelto, now on Coumadin secondary to insurance.  Disposition: F/u with me in 4-6 weeks.   Current medicines are reviewed at length with the patient today.  The patient did not have any concerns regarding medicines.  Melvern Banker PA-C 11/28/2016 1:24 PM     [address], [place] [address] [phone]

## 2016-11-28 NOTE — Progress Notes (Signed)
[facility] Consultation      Assessment and Plan:  The patient is a 61 year old male with symptoms and signs of obstructive sleep apnea.  Excessive daytime sleepiness. --Possible OSA, will send for sleep study.   Insomnia -Sleep maintenance.  --Will see how treatment of sleep apnea will affect this, if needed can start on a sedative medication for this.   Diabetes mellitus, atrial fibrillation, coronary artery disease, GERD. --Above conditions can be affected by OSA, therefore it is important to see if they improve after starting on PAP.    Date: 11/28/2016  MRN# [id number] Douglas Hunter 10/12/1955    Douglas Hunter is a 61 y.o. old male seen in consultation for chief complaint of:    Chief Complaint  Patient presents with  . sleep consult    per Christell Faith- no prior sleep study.  pt reports of loud snoring, trouble satying alseep, and occ daytime sleepiness.     HPI:   The patient is a 61 year old male referred for symptoms of excessive daytime sleepiness, as well as snoring. He usually goes to bed at 11 PM, falls asleep within 15 minutes. He usually wakes up around 6 AM.  His wife notes that he snores loudly, he notes that he wakes at night and often has trouble falling asleep. He notes that he is sleepy during the day, and often can fall asleep easily during the day. He usually wakes around 2 to 3 am and then has trouble falling back to sleep. He works for a [employer], but does not drive.    PMHX:   Past Medical History:  Diagnosis Date  . Atrial flutter ([place])   . Coronary artery disease 08/2011   a. NSTEMI 08/2011, LHC w/ severe disease of LCx with good collaterals, lesion was high-risk for intervention due to a steep angulation of the LCx coming from the left main coronary artery as well as heavy calcifications, discused at cath conference w/ consensus advising med Rx  . GERD (gastroesophageal reflux disease)   . Hyperlipidemia   .  Hypertension   . Paroxysmal atrial fibrillation ([place])    a. s/p ablation 01/2013; b. prn amio/dilt; c. CHADS2VASc => 3 (HTN, DM, vascular disease); d. on Coumadin  . Poorly controlled diabetes mellitus ([place])   . RBBB    Surgical Hx:  Past Surgical History:  Procedure Laterality Date  . ABLATION  01/29/2013   PVI and CTI by Dr Rayann Heman for atrial flutter and paroxysmal atrial fibrillation  . ATRIAL FIBRILLATION ABLATION N/A 01/29/2013   Procedure: ATRIAL FIBRILLATION ABLATION;  Surgeon: Coralyn Mark, MD;  Location: [facility] CATH LAB;  Service: Cardiovascular;  Laterality: N/A;  . CARDIAC CATHETERIZATION  08/03/2011  . COLONOSCOPY WITH PROPOFOL N/A 02/02/2016   Procedure: COLONOSCOPY WITH PROPOFOL;  Surgeon: Lucilla Lame, MD;  Location: [facility] ENDOSCOPY;  Service: Endoscopy;  Laterality: N/A;  . TEE WITHOUT CARDIOVERSION N/A 01/28/2013   Procedure: TRANSESOPHAGEAL ECHOCARDIOGRAM (TEE);  Surgeon: Lelon Perla, MD;  Location: [facility] ENDOSCOPY;  Service: Cardiovascular;  Laterality: N/A;   Family Hx:  Family History  Problem Relation Age of Onset  . Heart attack Maternal Uncle   . Heart attack Father 45  . Cancer Father        throat  . Stroke Mother   . Heart disease Mother        pacemaker  . Heart disease Brother        heart murmur  . Diabetes Son   .  Heart disease Paternal Grandfather        MI   Social Hx:   Social History  Substance Use Topics  . Smoking status: Former Smoker    Packs/day: 1.50    Years: 35.00    Types: Cigarettes    Quit date: 01/16/2008  . Smokeless tobacco: Never Used  . Alcohol use Yes     Comment: occassional   Medication:    Current Outpatient Prescriptions:  .  Acetaminophen 500 MG coapsule, Take by mouth., Disp: , Rfl:  .  amiodarone (PACERONE) 200 MG tablet, Take 1 tablet (200 mg total) by mouth 2 (two) times daily as needed (AFIB)., Disp: 60 tablet, Rfl: 3 .  aspirin EC 81 MG tablet, Take 81 mg by mouth daily., Disp: , Rfl:  .  atorvastatin  (LIPITOR) 80 MG tablet, Take 80 mg by mouth daily at 6 PM. , Disp: , Rfl: 0 .  benazepril (LOTENSIN) 40 MG tablet, TAKE 1 TABLET(40 MG) BY MOUTH DAILY, Disp: 90 tablet, Rfl: 0 .  diltiazem (CARDIZEM) 30 MG tablet, Take 1 tablet (30 mg total) by mouth 3 (three) times daily as needed (heart rate increases)., Disp: 90 tablet, Rfl: 3 .  ezetimibe (ZETIA) 10 MG tablet, Take 1 tablet (10 mg total) by mouth daily., Disp: 30 tablet, Rfl: 11 .  furosemide (LASIX) 20 MG tablet, Take 1 tablet (20 mg total) by mouth daily., Disp: 90 tablet, Rfl: 3 .  insulin NPH-regular Human (HUMULIN 70/30) (70-30) 100 UNIT/ML injection, Inject 60 Units into the skin 2 (two) times daily with a meal., Disp: 30 mL, Rfl: 12 .  INSULIN SYRINGE 1CC/29G 29G X 1/2" 1 ML MISC, USE AS DIRECTED, Disp: 100 each, Rfl: PRN .  isosorbide mononitrate (IMDUR) 30 MG 24 hr tablet, TAKE 1 TABLET BY MOUTH EVERY DAY., Disp: 30 tablet, Rfl: 3 .  metFORMIN (GLUCOPHAGE) 500 MG tablet, TAKE 1 TABLET(500 MG) BY MOUTH FOUR TIMES DAILY (Patient taking differently: TAKE 1 TABLET(500 MG) BY MOUTH TWO TIMES DAILY), Disp: 360 tablet, Rfl: 0 .  metoprolol tartrate (LOPRESSOR) 25 MG tablet, TAKE 1 TABLET BY MOUTH TWICE DAILY, Disp: 180 tablet, Rfl: 1 .  nitroGLYCERIN (NITROSTAT) 0.4 MG SL tablet, Place 1 tablet (0.4 mg total) under the tongue every 5 (five) minutes as needed for chest pain., Disp: 25 tablet, Rfl: 6 .  omeprazole (PRILOSEC) 20 MG capsule, Take 1 capsule (20 mg total) by mouth 2 (two) times daily before a meal. (Patient taking differently: Take 20 mg by mouth daily. ), Disp: 180 capsule, Rfl: 3 .  ONE TOUCH ULTRA TEST test strip, TEST THREE TIMES A DAY AS DIRECTED., Disp: 100 each, Rfl: 12 .  VIAGRA 100 MG tablet, TAKE 1 TABLET BY MOUTH EVERY DAY AS NEEDED, Disp: 10 tablet, Rfl: 0 .  warfarin (COUMADIN) 5 MG tablet, TAKE 1 TABLET(5 MG) BY MOUTH DAILY, Disp: 30 tablet, Rfl: 3   Allergies:  Patient has no known allergies.  Review of  Systems: Gen:  Denies  fever, sweats, chills HEENT: Denies blurred vision, double vision. bleeds, sore throat Cvc:  No dizziness, chest pain. Resp:   Denies cough or sputum production, shortness of breath Gi: Denies swallowing difficulty, stomach pain. Gu:  Denies bladder incontinence, burning urine Ext:   No Joint pain, stiffness. Skin: No skin rash,  hives  Endoc:  No polyuria, polydipsia. Psych: No depression, insomnia. Other:  All other systems were reviewed with the patient and were negative other that what is mentioned in the  HPI.   Physical Examination:   VS: BP (!) 142/70 (BP Location: Left Arm, Cuff Size: Normal)   Pulse (!) 54   Ht 5\' 11"  (1.803 m)   Wt 250 lb (113.4 kg)   SpO2 97%   BMI 34.87 kg/m   General Appearance: No distress  Neuro:without focal findings,  speech normal,  HEENT: PERRLA, EOM intact.  [place] 3.  Pulmonary: normal breath sounds, No wheezing.  CardiovascularNormal S1,S2.  No m/r/g.   Abdomen: Benign, Soft, non-tender. Renal:  No costovertebral tenderness  GU:  No performed at this time. Endoc: No evident thyromegaly, no signs of acromegaly. Skin:   warm, no rashes, no ecchymosis  Extremities: normal, no cyanosis, clubbing.  Other findings:    LABORATORY PANEL:   CBC No results for input(s): WBC, HGB, HCT, PLT in the last 168 hours. ------------------------------------------------------------------------------------------------------------------  Chemistries  No results for input(s): NA, K, CL, CO2, GLUCOSE, BUN, CREATININE, CALCIUM, MG, AST, ALT, ALKPHOS, BILITOT in the last 168 hours.  Invalid input(s): GFRCGP ------------------------------------------------------------------------------------------------------------------  Cardiac Enzymes No results for input(s): TROPONINI in the last 168 hours. ------------------------------------------------------------  RADIOLOGY:  No results found.     Thank  you for the consultation  and for allowing [facility] to assist in the care of your patient. Our recommendations are noted above.  Please contact us if we can be of further service.   Marda Stalker, MD.  Board Certified in Internal Medicine, Pulmonary Medicine, [place], and Sleep Medicine.   Pulmonary and Critical Care Office Number: [phone]  Patricia Pesa, M.D.  Merton Border, M.D  11/28/2016

## 2016-11-28 NOTE — Patient Instructions (Signed)
Medication Instructions:  Your physician has recommended you make the following change in your medication:  1- START Furosemide 20 mg (1 tablet) by mouth once a day.   Labwork: Your physician recommends that you return for lab work in: TODAY (BMP, TSH).   Testing/Procedures: None   Follow-Up: Your physician recommends that you schedule a follow-up appointment in: 2-3 [place].   If you need a refill on your cardiac medications before your next appointment, please call your pharmacy.

## 2016-11-28 NOTE — Patient Instructions (Signed)
  Sleep Apnea Sleep apnea is disorder that affects a person's sleep. A person with sleep apnea has abnormal pauses in their breathing when they sleep. It is hard for them to get a good sleep. This makes a person tired during the day. It also can lead to other physical problems. There are three types of sleep apnea. One type is when breathing stops for a short time because your airway is blocked (obstructive sleep apnea). Another type is when the brain sometimes fails to give the normal signal to breathe to the muscles that control your breathing (central sleep apnea). The third type is a combination of the other two types. HOME CARE   Take all medicine as told by your doctor.  Avoid alcohol, calming medicines (sedatives), and depressant drugs.  Try to lose weight if you are overweight. Talk to your doctor about a healthy weight goal.  Your doctor may have you use a device that helps to open your airway. It can help you get the air that you need. It is called a positive airway pressure (PAP) device.   MAKE SURE YOU:   Understand these instructions.  Will watch your condition.  Will get help right away if you are not doing well or get worse.  It may take approximately 1 month for you to get used to wearing her CPAP every night.  Be sure to work with your machine to get used to it, be patient, it may take time!

## 2016-11-29 ENCOUNTER — Other Ambulatory Visit: Payer: Self-pay | Admitting: *Deleted

## 2016-11-29 LAB — BASIC METABOLIC PANEL
BUN / CREAT RATIO: 16 (ref 10–24)
BUN: 19 mg/dL (ref 8–27)
CO2: 20 mmol/L (ref 20–29)
CREATININE: 1.18 mg/dL (ref 0.76–1.27)
Calcium: 9.5 mg/dL (ref 8.6–10.2)
Chloride: 104 mmol/L (ref 96–106)
GFR, EST AFRICAN AMERICAN: 76 mL/min/{1.73_m2} (ref >59)
GFR, EST NON AFRICAN AMERICAN: 66 mL/min/{1.73_m2} (ref >59)
Glucose: 201 mg/dL — ABNORMAL HIGH (ref 65–99)
Potassium: 4.2 mmol/L (ref 3.5–5.2)
Sodium: 139 mmol/L (ref 134–144)

## 2016-11-29 LAB — MAGNESIUM: Magnesium: 1.7 mg/dL (ref 1.6–2.3)

## 2016-11-29 MED ORDER — MAGNESIUM OXIDE 400 MG PO TABS: 400.0000 mg | ORAL_TABLET | Freq: Two times a day (BID) | ORAL | 3 refills | Status: DC

## 2016-12-07 ENCOUNTER — Ambulatory Visit: Payer: Managed Care, Other (non HMO) | Admitting: Cardiovascular Disease

## 2016-12-08 ENCOUNTER — Encounter: Payer: Self-pay | Admitting: Internal Medicine

## 2016-12-09 ENCOUNTER — Other Ambulatory Visit: Payer: Self-pay

## 2016-12-09 DIAGNOSIS — I471 Supraventricular tachycardia: Secondary | ICD-10-CM

## 2016-12-15 DIAGNOSIS — G4733 Obstructive sleep apnea (adult) (pediatric): Secondary | ICD-10-CM | POA: Diagnosis not present

## 2016-12-16 ENCOUNTER — Telehealth: Payer: Self-pay | Admitting: *Deleted

## 2016-12-16 DIAGNOSIS — G4733 Obstructive sleep apnea (adult) (pediatric): Secondary | ICD-10-CM

## 2016-12-16 NOTE — Telephone Encounter (Signed)
LMTCB:  Obstructive Sleep Apnea, AHI of 30 (Severe OSA).   Recommend:  Auto-CPAP with pressure settings of 5-20 cm H2O.

## 2016-12-19 ENCOUNTER — Encounter: Payer: Self-pay | Admitting: Internal Medicine

## 2016-12-19 ENCOUNTER — Ambulatory Visit (INDEPENDENT_AMBULATORY_CARE_PROVIDER_SITE_OTHER): Payer: 59 | Admitting: Internal Medicine

## 2016-12-19 VITALS — BP 130/56 | HR 53 | Ht 71.0 in | Wt 245.2 lb

## 2016-12-19 DIAGNOSIS — I471 Supraventricular tachycardia: Secondary | ICD-10-CM | POA: Diagnosis not present

## 2016-12-19 DIAGNOSIS — I472 Ventricular tachycardia: Secondary | ICD-10-CM | POA: Diagnosis not present

## 2016-12-19 DIAGNOSIS — I4729 Other ventricular tachycardia: Secondary | ICD-10-CM

## 2016-12-19 MED ORDER — METOPROLOL TARTRATE 25 MG PO TABS: ORAL_TABLET | ORAL | Status: DC

## 2016-12-19 NOTE — Patient Instructions (Signed)
Medication Instructions: - Your physician has recommended you make the following change in your medication:  1) STOP amiodarone 2) STOP aspirin 3) Continue metoprolol as you are currently taking, but you may also take an extra 25 mg of metoprolol as needed for episodes of a-fib  Labwork: - none ordered  Procedures/Testing: - none ordered  Follow-Up: - Your physician recommends that you schedule a follow-up appointment in: December with Dr. Rockey Situ.   - Dr. Caryl Comes will see you back on an as needed basis.  Any Additional Special Instructions Will Be Listed Below (If Applicable).     If you need a refill on your cardiac medications before your next appointment, please call your pharmacy.

## 2016-12-19 NOTE — Progress Notes (Signed)
ELECTROPHYSIOLOGY CONSULT NOTE  Patient ID: Douglas Hunter, MRN: [id number], DOB/AGE: 04-16-1955 61 y.o. Admit date: (Not on file) Date of Consult: 12/19/2016  Primary Physician: Sherrin Daisy, MD Primary Cardiologist: Douglas Hunter is a 61 y.o. male who is being seen today for the evaluation of Atrial ectopy  at the request of TG.    HPI Douglas Hunter is a 61 y.o. male seen with palpitations.  He has a history of atrial fibrillation and atrial flutter previously seen by Dr. Greggory Brandy;  10/14 he underwent EP testing with pulmonary vein isolation, ablation of the caval tricuspid isthmus as well as identification of a left atrial flutter.  Thereafter he was largely asymptomatic after the first couple of weeks. Over the last 1 is noted more palpitations and now has had recurrent episodes of atrial fibrillation. These typically has started the night in the last 4-8 hours. He will take amiodarone 4-600 mg on an as-needed basis for these episodes. More notably, he thinks that they terminate when he takes his metoprolol.  He was given an event recorder for the month of August. He had no atrial fibrillation episodes while wearing it did have multiple symptomatic events (see below)  Echocardiogram 8/18 normal LVEF mild LAE   He has a history of coronary artery disease with prior non-STEMI 2012 treated medically with a subtotal circumflex filling collateralization.  He denies chest pain or shortness of breath. He is mostly limited by his back. His only syncope was associated with documented hypoglycemia.    Past Medical History:  Diagnosis Date  . Atrial flutter ([place])   . Coronary artery disease 08/2011   a. NSTEMI 08/2011, LHC w/ severe disease of LCx with good collaterals, lesion was high-risk for intervention due to a steep angulation of the LCx coming from the left main coronary artery as well as heavy calcifications, discused at cath conference w/ consensus advising med Rx    . GERD (gastroesophageal reflux disease)   . Hyperlipidemia   . Hypertension   . Paroxysmal atrial fibrillation ([place])    a. s/p ablation 01/2013; b. prn amio/dilt; c. CHADS2VASc => 3 (HTN, DM, vascular disease); d. on Coumadin  . Poorly controlled diabetes mellitus ([place])   . RBBB       Surgical History:  Past Surgical History:  Procedure Laterality Date  . ABLATION  01/29/2013   PVI and CTI by Dr Rayann Heman for atrial flutter and paroxysmal atrial fibrillation  . ATRIAL FIBRILLATION ABLATION N/A 01/29/2013   Procedure: ATRIAL FIBRILLATION ABLATION;  Surgeon: Coralyn Mark, MD;  Location: [facility]ATH [facility];  Service: Cardiovascular;  Laterality: N/A;  . CARDIAC CATHETERIZATION  08/03/2011  . COLONOSCOPY WITH PROPOFOL N/A 02/02/2016   Procedure: COLONOSCOPY WITH PROPOFOL;  Surgeon: Lucilla Lame, MD;  Location: [facility] ENDOSCOPY;  Service: Endoscopy;  Laterality: N/A;  . TEE WITHOUT CARDIOVERSION N/A 01/28/2013   Procedure: TRANSESOPHAGEAL ECHOCARDIOGRAM (TEE);  Surgeon: Lelon Perla, MD;  Location: [facility] ENDOSCOPY;  Service: Cardiovascular;  Laterality: N/A;     Home Meds: Prior to Admission medications   Medication Sig Start Date End Date Taking? Authorizing Provider  Acetaminophen 500 MG coapsule Take by mouth.   Yes [provider]  amiodarone (PACERONE) 200 MG tablet Take 1 tablet (200 mg total) by mouth 2 (two) times daily as needed (AFIB). 03/16/15  Yes Minna Merritts, MD  aspirin EC 81 MG tablet Take 81 mg by mouth daily.   Yes [provider]  atorvastatin (LIPITOR) 80 MG tablet Take 80 mg by mouth daily at 6 PM.  11/19/15  Yes [provider]  benazepril (LOTENSIN) 40 MG tablet TAKE 1 TABLET(40 MG) BY MOUTH DAILY 02/03/16  Yes Kathrine Haddock, NP  diltiazem (CARDIZEM) 30 MG tablet Take 1 tablet (30 mg total) by mouth 3 (three) times daily as needed (heart rate increases). 03/16/15  Yes Minna Merritts, MD  ezetimibe (ZETIA) 10 MG tablet Take 1 tablet (10 mg  total) by mouth daily. 03/15/16  Yes Gollan, Kathlene November, MD  furosemide (LASIX) 20 MG tablet Take 1 tablet (20 mg total) by mouth daily. 11/28/16 02/26/17 Yes Dunn, Areta Haber, PA-C  insulin NPH-regular Human (HUMULIN 70/30) (70-30) 100 UNIT/ML injection Inject 60 Units into the skin 2 (two) times daily with a meal. 01/13/15  Yes Kathrine Haddock, NP  INSULIN SYRINGE 1CC/29G 29G X 1/2" 1 ML MISC USE AS DIRECTED 05/27/15  Yes Kathrine Haddock, NP  isosorbide mononitrate (IMDUR) 30 MG 24 hr tablet TAKE 1 TABLET BY MOUTH EVERY DAY. 09/20/16  Yes Gollan, Kathlene November, MD  magnesium oxide (MAG-OX) 400 MG tablet Take 1 tablet (400 mg total) by mouth 2 (two) times daily. 11/29/16  Yes Dunn, Areta Haber, PA-C  metFORMIN (GLUCOPHAGE) 500 MG tablet TAKE 1 TABLET(500 MG) BY MOUTH FOUR TIMES DAILY Patient taking differently: TAKE 1 TABLET(500 MG) BY MOUTH TWO TIMES DAILY 07/27/16  Yes Kathrine Haddock, NP  metoprolol tartrate (LOPRESSOR) 25 MG tablet TAKE 1 TABLET BY MOUTH TWICE DAILY 09/08/16  Yes Gollan, Kathlene November, MD  nitroGLYCERIN (NITROSTAT) 0.4 MG SL tablet Place 1 tablet (0.4 mg total) under the tongue every 5 (five) minutes as needed for chest pain. 03/15/16  Yes Gollan, Kathlene November, MD  omeprazole (PRILOSEC) 20 MG capsule Take 1 capsule (20 mg total) by mouth 2 (two) times daily before a meal. Patient taking differently: Take 20 mg by mouth daily.  01/13/15  Yes Kathrine Haddock, NP  ONE TOUCH ULTRA TEST test strip TEST THREE TIMES A DAY AS DIRECTED. 02/04/15  Yes Kathrine Haddock, NP  VIAGRA 100 MG tablet TAKE 1 TABLET BY MOUTH EVERY DAY AS NEEDED 10/28/15  Yes Gollan, Kathlene November, MD  warfarin (COUMADIN) 5 MG tablet TAKE 1 TABLET(5 MG) BY MOUTH DAILY 09/08/16  Yes Gollan, Kathlene November, MD    Allergies: No Known Allergies  Social History   Social History  . Marital status: Married    Spouse name: N/A  . Number of children: N/A  . Years of education: N/A   Occupational History  . Not on file.   Social History Main Topics  .  Smoking status: Former Smoker    Packs/day: 1.50    Years: 35.00    Types: Cigarettes    Quit date: 01/16/2008  . Smokeless tobacco: Never Used  . Alcohol use Yes     Comment: occassional  . Drug use: No  . Sexual activity: Yes   Other Topics Concern  . 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 Heart attack Maternal Uncle   . Heart attack Father 41  . Cancer Father        throat  . Stroke Mother   . Heart disease Mother        pacemaker  .  Heart disease Brother        heart murmur  . Diabetes Son   . Heart disease Paternal Grandfather        MI     ROS:  Please see the history of present illness.     All other systems reviewed and negative.    Physical Exam: Blood pressure (!) 130/56, pulse (!) 53, height 5\' 11"  (1.803 m), weight 245 lb 4 oz (111.2 kg). General: Well developed, well nourished male in no acute distress. Head: Normocephalic, atraumatic, sclera non-icteric, no xanthomas, nares are without discharge. EENT: normal  Lymph Nodes:  none Neck: Negative for carotid bruits. JVD not elevated. Back:without scoliosis kyphosis Lungs: Clear bilaterally to auscultation without wheezes, rales, or rhonchi. Breathing is unlabored. Heart: RRR with S1 S2. No murmur . No rubs, or gallops appreciated. Abdomen: Soft, non-tender, non-distended with normoactive bowel sounds. No hepatomegaly. No rebound/guarding. No obvious abdominal masses. Msk:  Strength and tone appear normal for age. Extremities: No clubbing or cyanosis. No edema.  Distal pedal pulses are 2+ and equal bilaterally. Skin: Warm and Dry Neuro: Alert and oriented X 3. CN III-XII intact Grossly normal sensory and motor function . Psych:  Responds to questions appropriately with a normal affect.      Labs: Cardiac Enzymes No results for  input(s): CKTOTAL, CKMB, TROPONINI in the last 72 hours. CBC Lab Results  Component Value Date   WBC 8.4 10/26/2016   HGB 13.1 10/26/2016   HCT 38.9 10/26/2016   MCV 93 10/26/2016   PLT 255 10/26/2016   PROTIME: No results for input(s): LABPROT, INR in the last 72 hours. Chemistry No results for input(s): NA, K, CL, CO2, BUN, CREATININE, CALCIUM, PROT, BILITOT, ALKPHOS, ALT, AST, GLUCOSE in the last 168 hours.  Invalid input(s): LABALBU Lipids Lab Results  Component Value Date   CHOL 162 01/13/2015   HDL 47 01/12/2013   LDLCALC 49 01/12/2013   TRIG 62 01/13/2015   BNP No results found for: PROBNP Thyroid Function Tests: No results for input(s): TSH, T4TOTAL, T3FREE, THYROIDAB in the last 72 hours.  Invalid input(s): FREET3 Miscellaneous No results found for: DDIMER  Radiology/Studies:  No results found.  EKG: Sinus rhythm at 53 Intervals 15/13/42 Right bundle branch block  Event recorder 8/18 was personally reviewed. Symptoms were associated with sinus rhythm, PACs, atrial couplets triplets and nonsustained atrial tachycardia. He was also found to have nonsustained ventricular tachycardia   Assessment and Plan:  Atrial fibrillation-paroxysmal and symptomatic nonsustained atrial ectopy  History of atrial fibrillation ablation  Ventricular tachycardia-nonsustained  Coronary artery disease medical therapy  Obstructive sleep apnea CPAP anticipated     the patient has symptomatic atrial ectopy. He also has had 2 episodes of atrial fibrillation of somewhat longer duration 4-6 hours, both of which occurred while sleeping. Having been recently identified as having sleep apnea CPAP anticipated, follow-up after initiation of CPAP seems most reasonable for the treatment of the more prolonged episodes of atrial fibrillation. Unfortunately, he is also very symptomatic from even simple PACs couplets and short runs. In the event that these become discombobulating,  antiarrhythmic therapy would be appropriate to consider. I discussed with Dr. Deidre Ala the role of when necessary amiodarone and we will stop this. In the event that he has longer runs of atrial fibrillation, he will use when necessary metoprolol in addition to his twice a day dosing.  We have also discussed aspirin in conjunction with his warfarin; we will stop his aspirin.  The implications  of nonsustained ventricular tachycardia in the context of normal LV function are related to symptoms and her appropriately addressed with his beta blocker.  I worry a little bit about the status of his coronary artery disease but he is quite fit at this point and is without chest pain or dyspnea on exertion. He is on lipid therapy.    Douglas Hunter

## 2016-12-26 ENCOUNTER — Ambulatory Visit (INDEPENDENT_AMBULATORY_CARE_PROVIDER_SITE_OTHER): Payer: 59

## 2016-12-26 ENCOUNTER — Other Ambulatory Visit: Payer: Self-pay

## 2016-12-26 DIAGNOSIS — D122 Benign neoplasm of ascending colon: Secondary | ICD-10-CM | POA: Diagnosis not present

## 2016-12-26 DIAGNOSIS — I4892 Unspecified atrial flutter: Secondary | ICD-10-CM | POA: Diagnosis not present

## 2016-12-26 DIAGNOSIS — I480 Paroxysmal atrial fibrillation: Secondary | ICD-10-CM

## 2016-12-26 DIAGNOSIS — Z5181 Encounter for therapeutic drug level monitoring: Secondary | ICD-10-CM

## 2016-12-26 LAB — POCT INR: INR: 2.8

## 2016-12-29 ENCOUNTER — Telehealth: Payer: Self-pay | Admitting: Physician Assistant

## 2016-12-29 NOTE — Telephone Encounter (Signed)
Pt calling stating a few weeks ago he had a sleep study done  He states we were to call his insurance and let him know about getting the correct CPAP machine for him He would like a call back for update  He states he's not heard from [place] in a few weeks

## 2016-12-29 NOTE — Telephone Encounter (Signed)
Order placed on 12/16/16 for CPAP that [facility] sent to [place] on 12/20/16. Pt states he still hasn't heard anything about receiving his CPAP machine. Please advise if there is something else we need to send. Thanks.

## 2016-12-29 NOTE — Telephone Encounter (Signed)
Routed to pulmonary 

## 2017-01-04 ENCOUNTER — Other Ambulatory Visit: Payer: Self-pay | Admitting: Cardiovascular Disease

## 2017-01-06 NOTE — Telephone Encounter (Signed)
Pt is calling back regarding the status of his CPAP. Please call.

## 2017-01-31 ENCOUNTER — Other Ambulatory Visit: Payer: Self-pay | Admitting: Physical Medicine and Rehabilitation

## 2017-01-31 DIAGNOSIS — M5416 Radiculopathy, lumbar region: Secondary | ICD-10-CM

## 2017-02-07 ENCOUNTER — Other Ambulatory Visit: Payer: Self-pay | Admitting: Cardiovascular Disease

## 2017-02-08 NOTE — Telephone Encounter (Signed)
Please review for refill. Thanks!  

## 2017-02-09 ENCOUNTER — Ambulatory Visit: Payer: 59 | Admitting: Internal Medicine

## 2017-02-09 ENCOUNTER — Ambulatory Visit
Admission: RE | Admit: 2017-02-09 | Discharge: 2017-02-09 | Disposition: A | Discharge status: Home / Self Care | Payer: 59 | Source: Ambulatory Visit | Attending: Physical Medicine and Rehabilitation | Admitting: Physical Medicine and Rehabilitation

## 2017-02-09 DIAGNOSIS — M48061 Spinal stenosis, lumbar region without neurogenic claudication: Secondary | ICD-10-CM | POA: Diagnosis not present

## 2017-02-09 DIAGNOSIS — M5136 Other intervertebral disc degeneration, lumbar region: Secondary | ICD-10-CM | POA: Diagnosis not present

## 2017-02-09 DIAGNOSIS — M5416 Radiculopathy, lumbar region: Secondary | ICD-10-CM | POA: Diagnosis present

## 2017-02-13 ENCOUNTER — Ambulatory Visit (INDEPENDENT_AMBULATORY_CARE_PROVIDER_SITE_OTHER): Payer: 59

## 2017-02-13 DIAGNOSIS — I4892 Unspecified atrial flutter: Secondary | ICD-10-CM

## 2017-02-13 DIAGNOSIS — I480 Paroxysmal atrial fibrillation: Secondary | ICD-10-CM

## 2017-02-13 DIAGNOSIS — Z23 Encounter for immunization: Secondary | ICD-10-CM

## 2017-02-13 DIAGNOSIS — Z5181 Encounter for therapeutic drug level monitoring: Secondary | ICD-10-CM | POA: Diagnosis not present

## 2017-02-13 LAB — POCT INR: INR: 2.8

## 2017-02-21 ENCOUNTER — Encounter: Payer: Self-pay | Admitting: Internal Medicine

## 2017-02-22 NOTE — Progress Notes (Signed)
[facility] Consultation      Assessment and Plan:  The patient is a 61 year old male with symptoms and signs of obstructive sleep apnea.  He also has a history of atrial fibrillation, diabetes mellitus, GERD, coronary artery disease  Obstructive sleep apnea --Obstructive Sleep Apnea, AHI of 30 (Severe OSA) on Auto-CPAP with pressure settings of 5-20 cm H2O.  -We will change auto settings to 7-15 centimeters of water.  Insomnia --Sleep maintenance.  --This has resolved since starting on CPAP treatment  Diabetes mellitus, atrial fibrillation, coronary artery disease, GERD. --Above conditions can be affected by OSA, therefore it is important to see if they improve after starting on PAP.  --BP mildly elevated today, following up with cardiology soon.   Former smoker.  --Will send for lung cancer screening.    Date: 02/22/2017  MRN# [id number] Douglas Hunter 20-Jul-1955    Douglas Hunter is a 61 y.o. old male seen in consultation for chief complaint of:    Chief Complaint  Patient presents with  . Sleep Apnea    pt wears cpap at least 6 hours night, he has no concerns.    HPI:   The patient is a 61 year old male with OSA. He is feeling much better, he is much more rested in the am and feeling "like a different person". He is no longer snoring.  He was previously having insomnia with difficulty initiating and maintaining sleep, these issues have resolved since starting on CPAP.  Reviewed 3 days download data as of 02/21/17, usage greater than 4 hours is 100%.  Average usage is 5 hours 56 minutes.  Current setting is auto 5-20.  Median pressure 7, 95th percentile pressure is 10, maximum is 12.  Residual AHI is 1.3.  Social Hx:   Social History   Tobacco Use  . Smoking status: Former Smoker    Packs/day: 1.50    Years: 35.00    Pack years: 52.50    Types: Cigarettes    Last attempt to quit: 01/16/2008    Years since quitting: 9.1  . Smokeless  tobacco: Never Used  Substance Use Topics  . Alcohol use: Yes    Comment: occassional  . Drug use: No   Medication:    Current Outpatient Medications:  .  Acetaminophen 500 MG coapsule, Take by mouth., Disp: , Rfl:  .  atorvastatin (LIPITOR) 80 MG tablet, Take 80 mg by mouth daily at 6 PM. , Disp: , Rfl: 0 .  benazepril (LOTENSIN) 40 MG tablet, TAKE 1 TABLET(40 MG) BY MOUTH DAILY, Disp: 90 tablet, Rfl: 0 .  diltiazem (CARDIZEM) 30 MG tablet, Take 1 tablet (30 mg total) by mouth 3 (three) times daily as needed (heart rate increases)., Disp: 90 tablet, Rfl: 3 .  ezetimibe (ZETIA) 10 MG tablet, Take 1 tablet (10 mg total) by mouth daily., Disp: 30 tablet, Rfl: 11 .  furosemide (LASIX) 20 MG tablet, Take 1 tablet (20 mg total) by mouth daily., Disp: 90 tablet, Rfl: 3 .  insulin NPH-regular Human (HUMULIN 70/30) (70-30) 100 UNIT/ML injection, Inject 60 Units into the skin 2 (two) times daily with a meal., Disp: 30 mL, Rfl: 12 .  INSULIN SYRINGE 1CC/29G 29G X 1/2" 1 ML MISC, USE AS DIRECTED, Disp: 100 each, Rfl: PRN .  isosorbide mononitrate (IMDUR) 30 MG 24 hr tablet, TAKE 1 TABLET BY MOUTH EVERY DAY., Disp: 30 tablet, Rfl: 6 .  magnesium oxide (MAG-OX) 400 MG tablet, Take 1 tablet (400 mg total) by  mouth 2 (two) times daily., Disp: 180 tablet, Rfl: 3 .  metFORMIN (GLUCOPHAGE) 500 MG tablet, TAKE 1 TABLET(500 MG) BY MOUTH FOUR TIMES DAILY (Patient taking differently: TAKE 1 TABLET(500 MG) BY MOUTH TWO TIMES DAILY), Disp: 360 tablet, Rfl: 0 .  metoprolol tartrate (LOPRESSOR) 25 MG tablet, Take 1 tablet (25 mg) by mouth twice daily, you may also take an extra 25 mg daily if you have an episode of a-fib, Disp: , Rfl:  .  nitroGLYCERIN (NITROSTAT) 0.4 MG SL tablet, Place 1 tablet (0.4 mg total) under the tongue every 5 (five) minutes as needed for chest pain., Disp: 25 tablet, Rfl: 6 .  omeprazole (PRILOSEC) 20 MG capsule, Take 1 capsule (20 mg total) by mouth 2 (two) times daily before a meal. (Patient  taking differently: Take 20 mg by mouth daily. ), Disp: 180 capsule, Rfl: 3 .  ONE TOUCH ULTRA TEST test strip, TEST THREE TIMES A DAY AS DIRECTED., Disp: 100 each, Rfl: 12 .  VIAGRA 100 MG tablet, TAKE 1 TABLET BY MOUTH EVERY DAY AS NEEDED, Disp: 10 tablet, Rfl: 0 .  warfarin (COUMADIN) 5 MG tablet, TAKE 1 TABLET BY MOUTH DAILY, Disp: 30 tablet, Rfl: 0   Allergies:  Patient has no known allergies.  Review of Systems: Gen:  Denies  fever, sweats, chills HEENT: Denies blurred vision, double vision. bleeds, sore throat Cvc:  No dizziness, chest pain. Resp:   Denies cough or sputum production, shortness of breath Gi: Denies swallowing difficulty, stomach pain. Gu:  Denies bladder incontinence, burning urine Ext:   No Joint pain, stiffness. Skin: No skin rash,  hives  Endoc:  No polyuria, polydipsia. Psych: No depression, insomnia. Other:  All other systems were reviewed with the patient and were negative other that what is mentioned in the HPI.   Physical Examination:   VS: BP (!) 156/76 (BP Location: Left Arm, Cuff Size: Large)   Pulse 67   Resp 16   Ht 5\' 11"  (1.803 m)   Wt 250 lb (113.4 kg)   SpO2 97%   BMI 34.87 kg/m   General Appearance: No distress  Neuro:without focal findings,  speech normal,  HEENT: PERRLA, EOM intact.  [place] 3.  Pulmonary: normal breath sounds, No wheezing.  CardiovascularNormal S1,S2.  No m/r/g.   Abdomen: Benign, Soft, non-tender. Renal:  No costovertebral tenderness  GU:  No performed at this time. Endoc: No evident thyromegaly, no signs of acromegaly. Skin:   warm, no rashes, no ecchymosis  Extremities: normal, no cyanosis, clubbing.  Other findings:    LABORATORY PANEL:   CBC No results for input(s): WBC, HGB, HCT, PLT in the last 168 hours. ------------------------------------------------------------------------------------------------------------------  Chemistries  No results for input(s): NA, K, CL, CO2, GLUCOSE, BUN,  CREATININE, CALCIUM, MG, AST, ALT, ALKPHOS, BILITOT in the last 168 hours.  Invalid input(s): GFRCGP ------------------------------------------------------------------------------------------------------------------  Cardiac Enzymes No results for input(s): TROPONINI in the last 168 hours. ------------------------------------------------------------  RADIOLOGY:  No results found.     Thank  you for the consultation and for allowing [facility] to assist in the care of your patient. Our recommendations are noted above.  Please contact us if we can be of further service.   Marda Stalker, MD.  Board Certified in Internal Medicine, Pulmonary Medicine, [place], and Sleep Medicine.   Pulmonary and Critical Care Office Number: [phone]  Patricia Pesa, M.D.  Merton Border, M.D  02/22/2017

## 2017-02-27 ENCOUNTER — Other Ambulatory Visit: Payer: Self-pay | Admitting: Internal Medicine

## 2017-02-27 ENCOUNTER — Ambulatory Visit: Payer: 59 | Admitting: Internal Medicine

## 2017-02-27 ENCOUNTER — Encounter: Payer: Self-pay | Admitting: Internal Medicine

## 2017-02-27 VITALS — BP 156/76 | HR 67 | Resp 16 | Ht 71.0 in | Wt 250.0 lb

## 2017-02-27 DIAGNOSIS — G4733 Obstructive sleep apnea (adult) (pediatric): Secondary | ICD-10-CM | POA: Diagnosis not present

## 2017-02-27 NOTE — Patient Instructions (Addendum)
-  We will change auto settings to 7-15 centimeters of water.  -Continue using CPAP every night.  --Will send for lung cancer screening.

## 2017-02-28 ENCOUNTER — Other Ambulatory Visit: Payer: Self-pay | Admitting: Cardiovascular Disease

## 2017-03-01 ENCOUNTER — Telehealth: Payer: Self-pay | Admitting: *Deleted

## 2017-03-01 NOTE — Telephone Encounter (Signed)
Received referral for low dose lung cancer screening CT scan. Contacted patient who refused scan stating that he didn't think he needed it. Patient is aware of rationale for screening scans without presence of symptoms of lung cancer. Assistance with copay or deductible charges is also offered. Patient continues to refuse scan at this time.

## 2017-03-01 NOTE — Telephone Encounter (Signed)
Received referral for low dose lung cancer screening CT scan. Message left at phone number listed in EMR for patient to call me back to facilitate scheduling scan.  

## 2017-03-01 NOTE — Telephone Encounter (Signed)
Thanks

## 2017-03-07 ENCOUNTER — Other Ambulatory Visit: Payer: Self-pay | Admitting: Cardiovascular Disease

## 2017-03-08 ENCOUNTER — Other Ambulatory Visit: Payer: Self-pay

## 2017-03-08 MED ORDER — WARFARIN SODIUM 5 MG PO TABS: 5.0000 mg | ORAL_TABLET | Freq: Every day | ORAL | 0 refills | Status: DC

## 2017-03-08 NOTE — Progress Notes (Signed)
Cardiology Office Note  Date:  03/09/2017   ID:  Douglas Hunter, Douglas Hunter [birth year], MRN [id number]  PCP:  Sherrin Daisy, MD   Chief Complaint  Patient presents with  . Other    2 month follow up. Patient denies chest pain and SOB. Meds reviewed verbally with patient.     HPI:  Douglas Hunter is a pleasant 61 -year-old gentleman with  CAD,  non-STEMI with admission to the hospital in late April 2012  cardiac catheterization showing severe mid left circumflex disease, subtotally occluded, with collaterals from the RCA to the LAD circumflex, normal ejection fraction who represented to the hospital shortly later with chest pain.   started on medical management,  nitrates and ranexa for his symptoms.  sleep apnea but has not had a sleep study.  episodes of atrial flutter. He reports symptoms dating back into his 42s.  2 recent hospitalizations for atrial flutter, 08/11/2012 and recently on 01/11/2013. Both episodes required hospitalization, medications for cardioversion. He has had several episodes that broke on their own  Previous ablation by Dr. Rayann Heman. Ablation for typical atrial flutter, atrial fibrillation.  Several episodes of flutter following the ablation that resolved with amiodarone. 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  Does not even use this as needed  compliant with his anticoagulation  hemoglobin A1c up to 8.0 No regular exercise program  Total cholesterol 124  He denies having any significant chest pain concerning for angina Would like Viagra No symptoms from bradycardia or PVCs  EKG on today's visit shows normal sinus  rhythm with rate 52 bpm, right bundle branch block with PVCs  Other past medical history Prior Echocardiogram shows normal LV systolic function, essentially normal study, May 2013  LDL 81, hemoglobin A1c 8.1, total cholesterol 144, HDL 50   PMH:   has a past medical history of Atrial flutter ([place]), Coronary artery disease (08/2011), GERD (gastroesophageal reflux disease), Hyperlipidemia, Hypertension, Paroxysmal atrial fibrillation ([place]), Poorly controlled diabetes mellitus ([place]), and RBBB.  PSH:    Past Surgical History:  Procedure Laterality Date  . ABLATION  01/29/2013   PVI and CTI by Dr Rayann Heman for atrial flutter and paroxysmal atrial fibrillation  . ATRIAL FIBRILLATION ABLATION N/A 01/29/2013   Procedure: ATRIAL FIBRILLATION ABLATION;  Surgeon: Coralyn Mark, MD;  Location: [facility] CATH [facility];  Service: Cardiovascular;  Laterality: N/A;  . CARDIAC CATHETERIZATION  08/03/2011  . COLONOSCOPY WITH PROPOFOL N/A 02/02/2016   Procedure: COLONOSCOPY WITH PROPOFOL;  Surgeon: Lucilla Lame, MD;  Location: [facility] ENDOSCOPY;  Service: Endoscopy;  Laterality: N/A;  . TEE WITHOUT CARDIOVERSION N/A 01/28/2013   Procedure: TRANSESOPHAGEAL ECHOCARDIOGRAM (TEE);  Surgeon: Lelon Perla, MD;  Location[facility] ENDOSCOPY;  Service: Cardiovascular;  Laterality: N/A;    Current Outpatient Medications  Medication Sig Dispense Refill  . Acetaminophen 500 MG coapsule Take by mouth.    Marland Kitchen atorvastatin (LIPITOR) 80 MG tablet Take 80 mg by mouth daily at 6 PM.   0  . benazepril (LOTENSIN) 40 MG tablet TAKE 1 TABLET(40 MG) BY MOUTH DAILY 90 tablet 0  . diltiazem (CARDIZEM) 30 MG tablet Take 1 tablet (30 mg total) by mouth 3 (  three) times daily as needed (heart rate increases). 90 tablet 3  . ezetimibe (ZETIA) 10 MG tablet TAKE 1 TABLET(10 MG) BY MOUTH DAILY 30 tablet 0  . insulin NPH-regular Human (HUMULIN 70/30) (70-30) 100 UNIT/ML injection Inject 60 Units into the skin 2 (two) times daily with a meal. 30 mL 12  .  INSULIN SYRINGE 1CC/29G 29G X 1/2" 1 ML MISC USE AS DIRECTED 100 each PRN  . isosorbide mononitrate (IMDUR) 30 MG 24 hr tablet TAKE 1 TABLET BY MOUTH EVERY DAY. 30 tablet 6  . magnesium oxide (MAG-OX) 400 MG tablet Take 1 tablet (400 mg total) by mouth 2 (two) times daily. 180 tablet 3  . metFORMIN (GLUCOPHAGE) 500 MG tablet TAKE 1 TABLET(500 MG) BY MOUTH FOUR TIMES DAILY (Patient taking differently: TAKE 1 TABLET(500 MG) BY MOUTH TWO TIMES DAILY) 360 tablet 0  . metoprolol tartrate (LOPRESSOR) 25 MG tablet Take 1 tablet (25 mg) by mouth twice daily, you may also take an extra 25 mg daily if you have an episode of a-fib    . metoprolol tartrate (LOPRESSOR) 25 MG tablet TAKE 1 TABLET BY MOUTH TWICE DAILY 180 tablet 3  . nitroGLYCERIN (NITROSTAT) 0.4 MG SL tablet Place 1 tablet (0.4 mg total) under the tongue every 5 (five) minutes as needed for chest pain. 25 tablet 6  . omeprazole (PRILOSEC) 20 MG capsule Take 1 capsule (20 mg total) by mouth 2 (two) times daily before a meal. (Patient taking differently: Take 20 mg by mouth daily. ) 180 capsule 3  . ONE TOUCH ULTRA TEST test strip TEST THREE TIMES A DAY AS DIRECTED. 100 each 12  . VIAGRA 100 MG tablet TAKE 1 TABLET BY MOUTH EVERY DAY AS NEEDED 10 tablet 0  . warfarin (COUMADIN) 5 MG tablet Take 1 tablet (5 mg total) by mouth daily. As directed by the coumadin clinic 30 tablet 0  . furosemide (LASIX) 20 MG tablet Take 1 tablet (20 mg total) by mouth daily. 90 tablet 3   No current facility-administered medications for this visit.      Allergies:   Patient has no known allergies.   Social History:  The patient  reports that he quit smoking about 9 years ago. His smoking use included cigarettes. He has a 52.50 pack-year smoking history. he has never used smokeless tobacco. He reports that he drinks alcohol. He reports that he does not use drugs.   Family History:   family history includes Cancer in his father; Diabetes in his son; Heart attack in  his maternal uncle; Heart attack (age of onset: 22) in his father; Heart disease in his brother, mother, and paternal grandfather; Stroke in his mother.    Review of Systems: Review of Systems  Constitutional: Negative.   Respiratory: Negative.   Cardiovascular: Positive for palpitations.  Gastrointestinal: Negative.   Musculoskeletal: Negative.   Neurological: Negative.   Psychiatric/Behavioral: Negative.   All other systems reviewed and are negative.    PHYSICAL EXAM: VS:  BP 130/62 (BP Location: Left Arm, Patient Position: Sitting, Cuff Size: Normal)   Pulse (!) 52   Ht 5\' 11"  (1.803 m)   Wt 248 lb (112.5 kg)   BMI 34.59 kg/m  , BMI Body mass index is 34.59 kg/m. GEN: Well nourished, well developed, in no acute distress, obese  HEENT: normal  Neck: no JVD, carotid bruits, or masses Cardiac: RRR; no murmurs, rubs, or gallops,no edema  Respiratory:  clear to auscultation bilaterally, normal work of breathing GI:  soft, nontender, nondistended, + BS MS: no deformity or atrophy  Skin: warm and dry, no rash Neuro:  Strength and sensation are intact Psych: euthymic mood, full affect    Recent Labs: 10/26/2016: ALT 30; Hemoglobin 13.1; Platelets 255; TSH 4.190 11/28/2016: BUN 19; Creatinine, Ser 1.18; Magnesium 1.7; Potassium 4.2; Sodium 139    Lipid Panel Lab Results  Component Value Date   CHOL 162 01/13/2015   HDL 47 01/12/2013   LDLCALC 49 01/12/2013   TRIG 62 01/13/2015      Wt Readings from Last 3 Encounters:  03/09/17 248 lb (112.5 kg)  02/27/17 250 lb (113.4 kg)  12/19/16 245 lb 4 oz (111.2 kg)       ASSESSMENT AND PLAN:  Paroxysmal atrial fibrillation (HCC) - Plan: EKG 12-Lead No changes to his medications, compliant with his warfarin, extra metoprolol as needed for breakthrough atrial fibrillation  Coronary artery disease involving native coronary artery of native heart without angina pectoris Currently with no symptoms of angina. No further workup  at this time. Stable  Essential hypertension Blood pressure is well controlled on today's visit. No changes made to the medications.  Mixed hyperlipidemia Cholesterol at goal LDL less than 70 No medication changes made  Type 2 diabetes mellitus with other circulatory complication, with long-term current use of insulin ([place]) Long discussion concerning his diet A1c of 8 Recommended he consider adding additional medication such as [employer] provided, he will talk with primary care   Total encounter time more than 25 minutes  Greater than 50% was spent in counseling and coordination of care with the patient  Disposition:   F/U  12 months   Orders Placed This Encounter  Procedures  . EKG 12-Lead     Signed, Esmond Plants, M.D., Ph.D. 03/09/2017  [place], [place]

## 2017-03-08 NOTE — Telephone Encounter (Signed)
Please review for refill.  I sent in a refill for warfarin with metoprolol by accident but did cancel the refill for warfarin. Can you review and send the Warfarin into [facility].

## 2017-03-09 ENCOUNTER — Encounter: Payer: Self-pay | Admitting: Cardiovascular Disease

## 2017-03-09 ENCOUNTER — Ambulatory Visit: Payer: 59 | Admitting: Cardiovascular Disease

## 2017-03-09 VITALS — BP 130/62 | HR 52 | Ht 71.0 in | Wt 248.0 lb

## 2017-03-09 DIAGNOSIS — R079 Chest pain, unspecified: Secondary | ICD-10-CM | POA: Diagnosis not present

## 2017-03-09 DIAGNOSIS — I4892 Unspecified atrial flutter: Secondary | ICD-10-CM | POA: Diagnosis not present

## 2017-03-09 DIAGNOSIS — E1165 Type 2 diabetes mellitus with hyperglycemia: Secondary | ICD-10-CM | POA: Diagnosis not present

## 2017-03-09 DIAGNOSIS — I10 Essential (primary) hypertension: Secondary | ICD-10-CM

## 2017-03-09 DIAGNOSIS — I25118 Atherosclerotic heart disease of native coronary artery with other forms of angina pectoris: Secondary | ICD-10-CM

## 2017-03-09 DIAGNOSIS — E782 Mixed hyperlipidemia: Secondary | ICD-10-CM

## 2017-03-09 DIAGNOSIS — I480 Paroxysmal atrial fibrillation: Secondary | ICD-10-CM

## 2017-03-09 MED ORDER — SILDENAFIL CITRATE 20 MG PO TABS: 20.0000 mg | ORAL_TABLET | Freq: Three times a day (TID) | ORAL | 3 refills | Status: DC | PRN

## 2017-03-09 NOTE — Patient Instructions (Addendum)

## 2017-03-13 ENCOUNTER — Ambulatory Visit: Payer: 59 | Admitting: Cardiovascular Disease

## 2017-03-29 ENCOUNTER — Ambulatory Visit (INDEPENDENT_AMBULATORY_CARE_PROVIDER_SITE_OTHER): Payer: 59

## 2017-03-29 DIAGNOSIS — I480 Paroxysmal atrial fibrillation: Secondary | ICD-10-CM | POA: Diagnosis not present

## 2017-03-29 DIAGNOSIS — Z5181 Encounter for therapeutic drug level monitoring: Secondary | ICD-10-CM | POA: Diagnosis not present

## 2017-03-29 DIAGNOSIS — I4892 Unspecified atrial flutter: Secondary | ICD-10-CM | POA: Diagnosis not present

## 2017-03-29 LAB — POCT INR: INR: 2.6

## 2017-03-29 NOTE — Patient Instructions (Signed)
Continue taking 1/2 tablet daily except 1 tablet on Mondays, Wednesdays, and Fridays.   Recheck in 6 weeks.

## 2017-04-04 ENCOUNTER — Other Ambulatory Visit: Payer: Self-pay | Admitting: Cardiovascular Disease

## 2017-05-10 ENCOUNTER — Ambulatory Visit (INDEPENDENT_AMBULATORY_CARE_PROVIDER_SITE_OTHER): Payer: 59

## 2017-05-10 DIAGNOSIS — Z5181 Encounter for therapeutic drug level monitoring: Secondary | ICD-10-CM

## 2017-05-10 DIAGNOSIS — I480 Paroxysmal atrial fibrillation: Secondary | ICD-10-CM

## 2017-05-10 DIAGNOSIS — I4892 Unspecified atrial flutter: Secondary | ICD-10-CM | POA: Diagnosis not present

## 2017-05-10 LAB — POCT INR: INR: 2.7

## 2017-05-10 NOTE — Patient Instructions (Signed)
Continue taking 1/2 tablet daily except 1 tablet on Mondays, Wednesdays, and Fridays.   Recheck in 6 weeks.

## 2017-05-18 ENCOUNTER — Telehealth: Payer: Self-pay | Admitting: Cardiovascular Disease

## 2017-05-18 NOTE — Telephone Encounter (Signed)
° °   [facility] Pre-operative Risk Assessment    Request for surgical clearance:  1. What type of surgery is being performed? Microdiscectomy   2. When is this surgery scheduled? 06/07/2017  3. What type of clearance is required (medical clearance vs. Pharmacy clearance to hold med vs. Both)?  Both   4. Are there any medications that need to be held prior to surgery and how long? Please advise  5. Practice name and name of physician performing surgery? [facility]  6. What is your office phone and fax number? [place] [phone]  7. Anesthesia type (None, local, MAC, general) ? UNKNOWN    Clarisse Gouge 05/18/2017, 9:59 AM  _________________________________________________________________   (provider comments below)

## 2017-05-18 NOTE — Telephone Encounter (Signed)
Left voicemail message to call back  

## 2017-05-18 NOTE — Telephone Encounter (Signed)
Note routed to number listed.

## 2017-05-18 NOTE — Telephone Encounter (Signed)
Except risk to hold warfarin They can let us know how many days, 5 or 7 Would restart warfarin when surgeon approves

## 2017-05-19 NOTE — Telephone Encounter (Signed)
Pt returning our call  ° °

## 2017-05-22 NOTE — Telephone Encounter (Signed)
S/w patient. He verbalized understanding of clearance. We discussed he may need an appt with coumadin clinic prior to and after for INR management. Routing to [employer], Therapist, sports. Patient's next appointment with Coumadin Clinic is not until 06/21/17.

## 2017-05-22 NOTE — Telephone Encounter (Signed)
Spoke w/ pt.  Advised him that once MD clears him to resume coumadin, to take an extra 1/2 dose x 2 days. Moved his appt up a week to 3/13 to check INR. He verbalizes understanding and is agreeable.  Asked him to call back w/ any questions or concerns.

## 2017-05-31 ENCOUNTER — Other Ambulatory Visit: Payer: Self-pay

## 2017-05-31 ENCOUNTER — Encounter
Admission: RE | Admit: 2017-05-31 | Discharge: 2017-05-31 | Disposition: A | Discharge status: Home / Self Care | Payer: 59 | Source: Ambulatory Visit | Attending: Neurosurgery | Admitting: Neurosurgery

## 2017-05-31 DIAGNOSIS — Z01812 Encounter for preprocedural laboratory examination: Secondary | ICD-10-CM | POA: Diagnosis present

## 2017-05-31 DIAGNOSIS — Z0183 Encounter for blood typing: Secondary | ICD-10-CM | POA: Diagnosis not present

## 2017-05-31 HISTORY — DX: Sleep apnea, unspecified: G47.30

## 2017-05-31 LAB — DIFFERENTIAL
Basophils Absolute: 0 10*3/uL (ref 0–0.1)
Basophils Relative: 0 %
EOS ABS: 0.8 10*3/uL — AB (ref 0–0.7)
EOS PCT: 8 %
LYMPHS ABS: 1.7 10*3/uL (ref 1.0–3.6)
LYMPHS PCT: 18 %
Monocytes Absolute: 0.9 10*3/uL (ref 0.2–1.0)
Monocytes Relative: 10 %
NEUTROS PCT: 64 %
Neutro Abs: 5.9 10*3/uL (ref 1.4–6.5)

## 2017-05-31 LAB — URINALYSIS, ROUTINE W REFLEX MICROSCOPIC
Bilirubin Urine: NEGATIVE
GLUCOSE, UA: 50 mg/dL — AB
Hgb urine dipstick: NEGATIVE
Ketones, ur: NEGATIVE mg/dL
LEUKOCYTES UA: NEGATIVE
NITRITE: NEGATIVE
PH: 6 (ref 5.0–8.0)
Protein, ur: NEGATIVE mg/dL
Specific Gravity, Urine: 1.01 (ref 1.005–1.030)

## 2017-05-31 LAB — CBC
HEMATOCRIT: 41.6 % (ref 40.0–52.0)
HEMOGLOBIN: 13.9 g/dL (ref 13.0–18.0)
MCH: 31.1 pg (ref 26.0–34.0)
MCHC: 33.4 g/dL (ref 32.0–36.0)
MCV: 93.3 fL (ref 80.0–100.0)
Platelets: 240 10*3/uL (ref 150–440)
RBC: 4.46 MIL/uL (ref 4.40–5.90)
RDW: 13.6 % (ref 11.5–14.5)
WBC: 9.3 10*3/uL (ref 3.8–10.6)

## 2017-05-31 LAB — BASIC METABOLIC PANEL
Anion gap: 9 (ref 5–15)
BUN: 18 mg/dL (ref 6–20)
CHLORIDE: 104 mmol/L (ref 101–111)
CO2: 22 mmol/L (ref 22–32)
CREATININE: 0.96 mg/dL (ref 0.61–1.24)
Calcium: 8.9 mg/dL (ref 8.9–10.3)
GFR calc non Af Amer: >60 mL/min (ref >60)
GLUCOSE: 254 mg/dL — AB (ref 65–99)
Potassium: 4.2 mmol/L (ref 3.5–5.1)
Sodium: 135 mmol/L (ref 135–145)

## 2017-05-31 LAB — SURGICAL PCR SCREEN
MRSA, PCR: NEGATIVE
STAPHYLOCOCCUS AUREUS: NEGATIVE

## 2017-05-31 LAB — TYPE AND SCREEN
ABO/RH(D): O POS
Antibody Screen: NEGATIVE

## 2017-05-31 LAB — APTT: aPTT: 44 seconds — ABNORMAL HIGH (ref 24–36)

## 2017-05-31 LAB — PROTIME-INR
INR: 2.44
Prothrombin Time: 26.3 seconds — ABNORMAL HIGH (ref 11.4–15.2)

## 2017-05-31 NOTE — Patient Instructions (Signed)
Your procedure is scheduled on: Wednesday June 07, 2017 Report to Same Day Surgery 2nd floor medical mall ([facility] Entrance-take elevator on left to 2nd floor.  Check in with surgery information desk.) To find out your arrival time please call [phone] between 1PM - 3PM on Tuesday June 06, 2017  Remember: Instructions that are not followed completely may result in serious medical risk, up to and including death, or upon the discretion of your surgeon and anesthesiologist your surgery may need to be rescheduled.    _x___ 1. Do not eat food after midnight the night before your procedure. You may drink water up to 2 hours before you are scheduled to arrive at the hospital for your procedure.  Do not drink anything within 2 hours of your scheduled arrival to the hospital.   No gum chewing or hard candies.     __x__ 2. No Alcohol for 24 hours before or after surgery.   __x__3. No Smoking or e-cigarettes for 24 prior to surgery.  Do not use any chewable tobacco products for at least 6 hour prior to surgery   ____  4. Bring all medications with you on the day of surgery if instructed.    __x__ 5. Notify your doctor if there is any change in your medical condition     (cold, fever, infections).   __x__6. On the morning of surgery brush your teeth with toothpaste and water.  You may rinse your mouth with mouth wash if you wish.  Do not swallow any toothpaste or mouthwash.   Do not wear jewelry.  Do not wear lotions, powders, or perfumes. You may wear deodorant.  Do not shave 48 hours prior to surgery. Men may shave face and neck.  Do not bring valuables to the hospital.    [facility] is not responsible for any belongings or valuables.               Contacts, dentures or bridgework may not be worn into surgery.  Leave your suitcase in the car. After surgery it may be brought to your room.  For patients admitted to the hospital, discharge time is determined by your treatment  team.    Patients discharged the day of surgery will not be allowed to drive home.  You will need someone to drive you home and stay with you the night of your procedure.    Please read over the following fact sheets that you were given:   [facility] Preparing for Surgery and or MRSA Information   _x___ Take anti-hypertensive listed below, cardiac, seizure, asthma, anti-reflux and psychiatric medicines. These include:  1. Atorvastatin/Lipitor  2. Ezetimibe/Zetia  3. Isosorbide/Imdur  4. Magnesium  5. Metoprolol/Lopressor  6. Omeprazole/Prilosec   Do not take your Benazepril/Lotensin the day of surgery.  _x___ Use CHG Soap or sage wipes as directed on instruction sheet   _x___ Stop Metformin and Janumet 2 days prior to surgery, Monday June 05, 2017.    _x___ Take 1/2 of usual insulin dose the night before surgery and none on the morning surgery.   _x___ Follow recommendations from Cardiologist, Pulmonologist or PCP regarding stopping Aspirin, Coumadin, Plavix ,Eliquis, Effient, or Pradaxa, and Pletal. Stop Warfarin 7 days prior to surgery, May 31, 2017.  _x___Stop Anti-inflammatories such as Advil, Aleve, Ibuprofen, Motrin, Naproxen, Naprosyn, Goodies powders or aspirin products. OK to take Tylenol and Celebrex.   _x___ Stop supplements until after surgery.  But may continue Vitamin D, Vitamin B, and multivitamin.  _x___ Bring C-Pap to the hospital.

## 2017-06-01 NOTE — Pre-Procedure Instructions (Signed)
LABS FAXED TO DR [place] PT/APTT ORDERED FOR AM SURGERY

## 2017-06-07 ENCOUNTER — Encounter
Admission: RE | Disposition: A | Discharge status: Home / Self Care | Payer: Self-pay | Source: Ambulatory Visit | Attending: Neurosurgery

## 2017-06-07 ENCOUNTER — Ambulatory Visit
Admission: RE | Admit: 2017-06-07 | Discharge: 2017-06-07 | Disposition: A | Discharge status: Home / Self Care | Payer: No Typology Code available for payment source | Source: Ambulatory Visit | Attending: Neurosurgery | Admitting: Neurosurgery

## 2017-06-07 ENCOUNTER — Ambulatory Visit: Payer: No Typology Code available for payment source | Admitting: Certified Registered"

## 2017-06-07 ENCOUNTER — Other Ambulatory Visit: Payer: Self-pay

## 2017-06-07 ENCOUNTER — Encounter: Payer: Self-pay | Admitting: *Deleted

## 2017-06-07 ENCOUNTER — Ambulatory Visit: Payer: No Typology Code available for payment source

## 2017-06-07 DIAGNOSIS — I2510 Atherosclerotic heart disease of native coronary artery without angina pectoris: Secondary | ICD-10-CM | POA: Diagnosis not present

## 2017-06-07 DIAGNOSIS — Z87891 Personal history of nicotine dependence: Secondary | ICD-10-CM | POA: Insufficient documentation

## 2017-06-07 DIAGNOSIS — I10 Essential (primary) hypertension: Secondary | ICD-10-CM | POA: Diagnosis not present

## 2017-06-07 DIAGNOSIS — Z794 Long term (current) use of insulin: Secondary | ICD-10-CM | POA: Diagnosis not present

## 2017-06-07 DIAGNOSIS — I4891 Unspecified atrial fibrillation: Secondary | ICD-10-CM | POA: Insufficient documentation

## 2017-06-07 DIAGNOSIS — M48062 Spinal stenosis, lumbar region with neurogenic claudication: Secondary | ICD-10-CM | POA: Insufficient documentation

## 2017-06-07 DIAGNOSIS — Z7982 Long term (current) use of aspirin: Secondary | ICD-10-CM | POA: Diagnosis not present

## 2017-06-07 DIAGNOSIS — Z79899 Other long term (current) drug therapy: Secondary | ICD-10-CM | POA: Diagnosis not present

## 2017-06-07 DIAGNOSIS — M5416 Radiculopathy, lumbar region: Secondary | ICD-10-CM | POA: Insufficient documentation

## 2017-06-07 DIAGNOSIS — E119 Type 2 diabetes mellitus without complications: Secondary | ICD-10-CM | POA: Diagnosis not present

## 2017-06-07 DIAGNOSIS — Z6834 Body mass index (BMI) 34.0-34.9, adult: Secondary | ICD-10-CM | POA: Diagnosis not present

## 2017-06-07 DIAGNOSIS — E669 Obesity, unspecified: Secondary | ICD-10-CM | POA: Insufficient documentation

## 2017-06-07 DIAGNOSIS — I4892 Unspecified atrial flutter: Secondary | ICD-10-CM | POA: Insufficient documentation

## 2017-06-07 DIAGNOSIS — E785 Hyperlipidemia, unspecified: Secondary | ICD-10-CM | POA: Insufficient documentation

## 2017-06-07 DIAGNOSIS — Z7901 Long term (current) use of anticoagulants: Secondary | ICD-10-CM | POA: Insufficient documentation

## 2017-06-07 DIAGNOSIS — N529 Male erectile dysfunction, unspecified: Secondary | ICD-10-CM | POA: Insufficient documentation

## 2017-06-07 DIAGNOSIS — I480 Paroxysmal atrial fibrillation: Secondary | ICD-10-CM | POA: Insufficient documentation

## 2017-06-07 DIAGNOSIS — G4730 Sleep apnea, unspecified: Secondary | ICD-10-CM | POA: Insufficient documentation

## 2017-06-07 DIAGNOSIS — I252 Old myocardial infarction: Secondary | ICD-10-CM | POA: Diagnosis not present

## 2017-06-07 DIAGNOSIS — K219 Gastro-esophageal reflux disease without esophagitis: Secondary | ICD-10-CM | POA: Insufficient documentation

## 2017-06-07 DIAGNOSIS — Z419 Encounter for procedure for purposes other than remedying health state, unspecified: Secondary | ICD-10-CM

## 2017-06-07 HISTORY — PX: LUMBAR LAMINECTOMY/DECOMPRESSION MICRODISCECTOMY: SHX5026

## 2017-06-07 LAB — PROTIME-INR
INR: 0.96
PROTHROMBIN TIME: 12.7 s (ref 11.4–15.2)

## 2017-06-07 LAB — ABO/RH: ABO/RH(D): O POS

## 2017-06-07 LAB — GLUCOSE, CAPILLARY
GLUCOSE-CAPILLARY: 181 mg/dL — AB (ref 65–99)
GLUCOSE-CAPILLARY: 292 mg/dL — AB (ref 65–99)

## 2017-06-07 LAB — APTT: aPTT: 30 seconds (ref 24–36)

## 2017-06-07 SURGERY — LUMBAR LAMINECTOMY/DECOMPRESSION MICRODISCECTOMY 2 LEVELS
Anesthesia: General | Site: Spine Lumbar | Wound class: Clean

## 2017-06-07 MED ORDER — LIDOCAINE HCL (PF) 2 % IJ SOLN
INTRAMUSCULAR | Status: AC
  Filled 2017-06-07: qty 10

## 2017-06-07 MED ORDER — METHYLPREDNISOLONE ACETATE 40 MG/ML IJ SUSP
INTRAMUSCULAR | Status: AC
  Filled 2017-06-07: qty 1

## 2017-06-07 MED ORDER — BUPIVACAINE-EPINEPHRINE (PF) 0.5% -1:200000 IJ SOLN
INTRAMUSCULAR | Status: DC | PRN
  Administered 2017-06-07: 10 mL

## 2017-06-07 MED ORDER — OXYCODONE HCL 5 MG PO TABS
5.0000 mg | ORAL_TABLET | Freq: Once | ORAL | Status: AC | PRN
  Administered 2017-06-07: 5 mg via ORAL

## 2017-06-07 MED ORDER — METHOCARBAMOL 500 MG PO TABS: 500.0000 mg | ORAL_TABLET | Freq: Four times a day (QID) | ORAL | 0 refills | Status: DC | PRN

## 2017-06-07 MED ORDER — ROCURONIUM BROMIDE 100 MG/10ML IV SOLN
INTRAVENOUS | Status: DC | PRN
  Administered 2017-06-07: 50 mg via INTRAVENOUS

## 2017-06-07 MED ORDER — LIDOCAINE HCL (CARDIAC) 20 MG/ML IV SOLN
INTRAVENOUS | Status: DC | PRN
  Administered 2017-06-07: 60 mg via INTRAVENOUS

## 2017-06-07 MED ORDER — EPHEDRINE SULFATE 50 MG/ML IJ SOLN
INTRAMUSCULAR | Status: DC | PRN
  Administered 2017-06-07 (×3): 10 mg via INTRAVENOUS

## 2017-06-07 MED ORDER — HYDROMORPHONE HCL 1 MG/ML IJ SOLN
INTRAMUSCULAR | Status: AC
Start: 2017-06-07 — End: ?
  Filled 2017-06-07: qty 2

## 2017-06-07 MED ORDER — ONDANSETRON HCL 4 MG/2ML IJ SOLN: 4.0000 mg | Freq: Once | INTRAMUSCULAR | Status: DC | PRN

## 2017-06-07 MED ORDER — DEXMEDETOMIDINE HCL 200 MCG/2ML IV SOLN
INTRAVENOUS | Status: DC | PRN
  Administered 2017-06-07: 12 ug via INTRAVENOUS
  Administered 2017-06-07: 8 ug via INTRAVENOUS

## 2017-06-07 MED ORDER — ACETAMINOPHEN 10 MG/ML IV SOLN
INTRAVENOUS | Status: DC | PRN
  Administered 2017-06-07: 1000 mg via INTRAVENOUS

## 2017-06-07 MED ORDER — ROCURONIUM BROMIDE 50 MG/5ML IV SOLN
INTRAVENOUS | Status: AC
  Filled 2017-06-07: qty 1

## 2017-06-07 MED ORDER — BUPIVACAINE LIPOSOME 1.3 % IJ SUSP
INTRAMUSCULAR | Status: DC | PRN
  Administered 2017-06-07: 40 mL

## 2017-06-07 MED ORDER — FENTANYL CITRATE (PF) 100 MCG/2ML IJ SOLN
INTRAMUSCULAR | Status: AC
  Administered 2017-06-07: 25 ug via INTRAVENOUS
  Filled 2017-06-07: qty 2

## 2017-06-07 MED ORDER — SODIUM CHLORIDE FLUSH 0.9 % IV SOLN
INTRAVENOUS | Status: AC
  Filled 2017-06-07: qty 20

## 2017-06-07 MED ORDER — OXYCODONE HCL 5 MG PO TABS
ORAL_TABLET | ORAL | Status: AC
  Filled 2017-06-07: qty 1

## 2017-06-07 MED ORDER — BACITRACIN 50000 UNITS IM SOLR
INTRAMUSCULAR | Status: AC
  Filled 2017-06-07: qty 1

## 2017-06-07 MED ORDER — INSULIN ASPART 100 UNIT/ML ~~LOC~~ SOLN
5.0000 [IU] | Freq: Once | SUBCUTANEOUS | Status: AC
  Administered 2017-06-07: 5 [IU] via SUBCUTANEOUS

## 2017-06-07 MED ORDER — OXYCODONE HCL 5 MG PO TABS: 5.0000 mg | ORAL_TABLET | ORAL | 0 refills | Status: DC | PRN

## 2017-06-07 MED ORDER — EPHEDRINE SULFATE 50 MG/ML IJ SOLN
INTRAMUSCULAR | Status: AC
  Filled 2017-06-07: qty 1

## 2017-06-07 MED ORDER — KETAMINE HCL 10 MG/ML IJ SOLN
INTRAMUSCULAR | Status: DC | PRN
  Administered 2017-06-07: 50 mg via INTRAVENOUS

## 2017-06-07 MED ORDER — CEFAZOLIN SODIUM-DEXTROSE 2-4 GM/100ML-% IV SOLN
2.0000 g | Freq: Once | INTRAVENOUS | Status: AC
  Administered 2017-06-07: 2 g via INTRAVENOUS

## 2017-06-07 MED ORDER — BUPIVACAINE LIPOSOME 1.3 % IJ SUSP
INTRAMUSCULAR | Status: AC
  Filled 2017-06-07: qty 20

## 2017-06-07 MED ORDER — ACETAMINOPHEN 10 MG/ML IV SOLN
INTRAVENOUS | Status: AC
  Filled 2017-06-07: qty 100

## 2017-06-07 MED ORDER — SUGAMMADEX SODIUM 500 MG/5ML IV SOLN
INTRAVENOUS | Status: DC | PRN
  Administered 2017-06-07: 230 mg via INTRAVENOUS

## 2017-06-07 MED ORDER — MIDAZOLAM HCL 2 MG/2ML IJ SOLN
INTRAMUSCULAR | Status: DC | PRN
  Administered 2017-06-07: 2 mg via INTRAVENOUS

## 2017-06-07 MED ORDER — SUGAMMADEX SODIUM 500 MG/5ML IV SOLN
INTRAVENOUS | Status: AC
  Filled 2017-06-07: qty 5

## 2017-06-07 MED ORDER — METHYLPREDNISOLONE ACETATE 40 MG/ML IJ SUSP
INTRAMUSCULAR | Status: DC | PRN
  Administered 2017-06-07: 40 mg

## 2017-06-07 MED ORDER — BUPIVACAINE HCL (PF) 0.5 % IJ SOLN
INTRAMUSCULAR | Status: DC | PRN
  Administered 2017-06-07: 20 mL

## 2017-06-07 MED ORDER — FENTANYL CITRATE (PF) 100 MCG/2ML IJ SOLN
25.0000 ug | INTRAMUSCULAR | Status: DC | PRN
  Administered 2017-06-07 (×4): 25 ug via INTRAVENOUS

## 2017-06-07 MED ORDER — DEXAMETHASONE SODIUM PHOSPHATE 10 MG/ML IJ SOLN
INTRAMUSCULAR | Status: AC
Start: 2017-06-07 — End: ?
  Filled 2017-06-07: qty 1

## 2017-06-07 MED ORDER — THROMBIN (RECOMBINANT) 5000 UNITS EX SOLR
CUTANEOUS | Status: DC | PRN
  Administered 2017-06-07: 5000 [IU] via TOPICAL

## 2017-06-07 MED ORDER — ONDANSETRON HCL 4 MG/2ML IJ SOLN
INTRAMUSCULAR | Status: AC
  Filled 2017-06-07: qty 2

## 2017-06-07 MED ORDER — THROMBIN (RECOMBINANT) 5000 UNITS EX SOLR
CUTANEOUS | Status: AC
  Filled 2017-06-07: qty 5000

## 2017-06-07 MED ORDER — KETAMINE HCL 50 MG/ML IJ SOLN
INTRAMUSCULAR | Status: AC
  Filled 2017-06-07: qty 10

## 2017-06-07 MED ORDER — BACITRACIN 50000 UNITS IM SOLR
INTRAMUSCULAR | Status: DC | PRN
  Administered 2017-06-07: 50000 [IU]

## 2017-06-07 MED ORDER — BUPIVACAINE HCL (PF) 0.5 % IJ SOLN
INTRAMUSCULAR | Status: AC
  Filled 2017-06-07: qty 30

## 2017-06-07 MED ORDER — MIDAZOLAM HCL 2 MG/2ML IJ SOLN
INTRAMUSCULAR | Status: AC
  Filled 2017-06-07: qty 4

## 2017-06-07 MED ORDER — PHENYLEPHRINE HCL 10 MG/ML IJ SOLN
INTRAMUSCULAR | Status: AC
  Filled 2017-06-07: qty 2

## 2017-06-07 MED ORDER — GLYCOPYRROLATE 0.2 MG/ML IJ SOLN
INTRAMUSCULAR | Status: DC | PRN
  Administered 2017-06-07 (×2): 0.1 mg via INTRAVENOUS

## 2017-06-07 MED ORDER — SEVOFLURANE IN SOLN
RESPIRATORY_TRACT | Status: AC
  Filled 2017-06-07: qty 250

## 2017-06-07 MED ORDER — INSULIN ASPART 100 UNIT/ML ~~LOC~~ SOLN
SUBCUTANEOUS | Status: AC
  Filled 2017-06-07: qty 1

## 2017-06-07 MED ORDER — LACTATED RINGERS IV SOLN
INTRAVENOUS | Status: DC | PRN
  Administered 2017-06-07 (×2): via INTRAVENOUS

## 2017-06-07 MED ORDER — PROPOFOL 10 MG/ML IV BOLUS
INTRAVENOUS | Status: DC | PRN
  Administered 2017-06-07: 150 mg via INTRAVENOUS
  Administered 2017-06-07: 50 mg via INTRAVENOUS

## 2017-06-07 MED ORDER — SODIUM CHLORIDE 0.9 % IV SOLN: INTRAVENOUS | Status: DC

## 2017-06-07 MED ORDER — ONDANSETRON HCL 4 MG/2ML IJ SOLN
INTRAMUSCULAR | Status: DC | PRN
  Administered 2017-06-07: 4 mg via INTRAVENOUS

## 2017-06-07 MED ORDER — PROPOFOL 10 MG/ML IV BOLUS
INTRAVENOUS | Status: AC
  Filled 2017-06-07: qty 40

## 2017-06-07 MED ORDER — DEXAMETHASONE SODIUM PHOSPHATE 10 MG/ML IJ SOLN
INTRAMUSCULAR | Status: DC | PRN
  Administered 2017-06-07: 5 mg via INTRAVENOUS

## 2017-06-07 MED ORDER — HYDROMORPHONE HCL 1 MG/ML IJ SOLN
INTRAMUSCULAR | Status: DC | PRN
  Administered 2017-06-07 (×2): 1 mg via INTRAVENOUS

## 2017-06-07 MED ORDER — BUPIVACAINE-EPINEPHRINE (PF) 0.5% -1:200000 IJ SOLN
INTRAMUSCULAR | Status: AC
  Filled 2017-06-07: qty 30

## 2017-06-07 MED ORDER — GLYCOPYRROLATE 0.2 MG/ML IJ SOLN
INTRAMUSCULAR | Status: AC
  Filled 2017-06-07: qty 1

## 2017-06-07 SURGICAL SUPPLY — 66 items
BLADE BOVIE TIP EXT 4 (BLADE) ×3 IMPLANT
BUR NEURO DRILL SOFT 3.0X3.8M (BURR) ×3 IMPLANT
CANISTER SUCT 1200ML W/VALVE (MISCELLANEOUS) ×6 IMPLANT
CHLORAPREP W/TINT 26ML (MISCELLANEOUS) ×6 IMPLANT
CNTNR SPEC 2.5X3XGRAD LEK (MISCELLANEOUS) ×1
CONT SPEC 4OZ STER OR WHT (MISCELLANEOUS) ×2
CONTAINER SPEC 2.5X3XGRAD LEK (MISCELLANEOUS) ×1 IMPLANT
COUNTER NEEDLE 20/40 LG (NEEDLE) ×3 IMPLANT
COVER LIGHT HANDLE STERIS (MISCELLANEOUS) ×6 IMPLANT
CUP MEDICINE 2OZ PLAST GRAD ST (MISCELLANEOUS) ×6 IMPLANT
DERMABOND ADVANCED (GAUZE/BANDAGES/DRESSINGS) ×2
DERMABOND ADVANCED .7 DNX12 (GAUZE/BANDAGES/DRESSINGS) ×1 IMPLANT
DRAPE C-ARM 42X72 X-RAY (DRAPES) ×6 IMPLANT
DRAPE LAPAROTOMY 100X77 ABD (DRAPES) ×3 IMPLANT
DRAPE MICROSCOPE SPINE 48X150 (DRAPES) ×3 IMPLANT
DRAPE POUCH INSTRU U-SHP 10X18 (DRAPES) ×3 IMPLANT
DRAPE SURG 17X11 SM STRL (DRAPES) ×12 IMPLANT
DRSG TEGADERM 4X4.75 (GAUZE/BANDAGES/DRESSINGS) ×3 IMPLANT
DRSG TELFA 4X3 1S NADH ST (GAUZE/BANDAGES/DRESSINGS) IMPLANT
ELECT CAUTERY BLADE TIP 2.5 (TIP) ×3
ELECT EZSTD 165MM 6.5IN (MISCELLANEOUS) ×3
ELECTRODE CAUTERY BLDE TIP 2.5 (TIP) ×1 IMPLANT
ELECTRODE EZSTD 165MM 6.5IN (MISCELLANEOUS) ×1 IMPLANT
EVICEL AIRLESS SPRAY ACCES (MISCELLANEOUS) IMPLANT
FRAME EYE SHIELD (PROTECTIVE WEAR) ×3 IMPLANT
GLOVE BIO SURGEON STRL SZ 6.5 (GLOVE) ×4 IMPLANT
GLOVE BIO SURGEONS STRL SZ 6.5 (GLOVE) ×2
GLOVE BIOGEL PI IND STRL 7.0 (GLOVE) ×2 IMPLANT
GLOVE BIOGEL PI INDICATOR 7.0 (GLOVE) ×4
GLOVE SURG SYN 8.5  E (GLOVE) ×6
GLOVE SURG SYN 8.5 E (GLOVE) ×3 IMPLANT
GOWN SRG XL LVL 3 NONREINFORCE (GOWNS) ×1 IMPLANT
GOWN STRL NON-REIN TWL XL LVL3 (GOWNS) ×2
GOWN STRL REUS W/ TWL LRG LVL3 (GOWN DISPOSABLE) ×1 IMPLANT
GOWN STRL REUS W/TWL LRG LVL3 (GOWN DISPOSABLE) ×2
GRADUATE 1200CC STRL 31836 (MISCELLANEOUS) ×3 IMPLANT
IV CATH ANGIO 12GX3 LT BLUE (NEEDLE) ×3 IMPLANT
KIT SPINAL PRONEVIEW (KITS) ×3 IMPLANT
KNIFE BAYONET SHORT DISCETOMY (MISCELLANEOUS) ×3 IMPLANT
MARKER SKIN DUAL TIP RULER LAB (MISCELLANEOUS) ×9 IMPLANT
NDL SAFETY ECLIPSE 18X1.5 (NEEDLE) ×1 IMPLANT
NEEDLE HYPO 18GX1.5 SHARP (NEEDLE) ×2
NEEDLE HYPO 22GX1.5 SAFETY (NEEDLE) ×3 IMPLANT
NS IRRIG 1000ML POUR BTL (IV SOLUTION) ×3 IMPLANT
PACK LAMINECTOMY NEURO (CUSTOM PROCEDURE TRAY) ×3 IMPLANT
PAD ARMBOARD 7.5X6 YLW CONV (MISCELLANEOUS) ×3 IMPLANT
PATTIES SURGICAL .5X1.5 (GAUZE/BANDAGES/DRESSINGS) IMPLANT
SPOGE SURGIFLO 8M (HEMOSTASIS) ×4
SPONGE SURGIFLO 8M (HEMOSTASIS) ×2 IMPLANT
STAPLER SKIN PROX 35W (STAPLE) IMPLANT
SUT DVC VLOC 3-0 CL 6 P-12 (SUTURE) ×3 IMPLANT
SUT NURALON 4 0 TR CR/8 (SUTURE) IMPLANT
SUT VIC AB 0 CT1 27 (SUTURE)
SUT VIC AB 0 CT1 27XCR 8 STRN (SUTURE) IMPLANT
SUT VIC AB 2-0 CT1 18 (SUTURE) ×3 IMPLANT
SUT VICRYL 0 AB UR-6 (SUTURE) ×3 IMPLANT
SUT VICRYL 0 UR6 27IN ABS (SUTURE) IMPLANT
SYR 10ML LL (SYRINGE) ×3 IMPLANT
SYR 20CC LL (SYRINGE) ×3 IMPLANT
SYR 30ML LL (SYRINGE) ×6 IMPLANT
SYR 3ML LL SCALE MARK (SYRINGE) ×3 IMPLANT
TOWEL OR 17X26 4PK STRL BLUE (TOWEL DISPOSABLE) ×12 IMPLANT
TUBE MATRX SPINL 18MM 8CM DISP (INSTRUMENTS) ×2
TUBE METRX SPINAL 18X8 DISP (INSTRUMENTS) ×1 IMPLANT
TUBING CONNECTING 10 (TUBING) ×2 IMPLANT
TUBING CONNECTING 10' (TUBING) ×1

## 2017-06-07 NOTE — Anesthesia Preprocedure Evaluation (Addendum)
Anesthesia Evaluation  Patient identified by MRN, date of birth, ID band Patient awake    Reviewed: Allergy & Precautions, H&P , NPO status , Patient's Chart, lab work & pertinent test results, reviewed documented beta blocker date and time   Airway Mallampati: II  TM Distance: >3 FB Neck ROM: full    Dental  (+) Teeth Intact   Pulmonary neg pulmonary ROS, sleep apnea and Continuous Positive Airway Pressure Ventilation , former smoker,    Pulmonary exam normal        Cardiovascular Exercise Tolerance: Good hypertension, + CAD  negative cardio ROS Normal cardiovascular exam+ dysrhythmias Supra Ventricular Tachycardia  Rhythm:regular Rate:Normal     Neuro/Psych negative neurological ROS  negative psych ROS   GI/Hepatic negative GI ROS, Neg liver ROS, GERD  Medicated,  Endo/Other  negative endocrine ROSdiabetes, Well Controlled  Renal/GU negative Renal ROS  negative genitourinary   Musculoskeletal   Abdominal   Peds  Hematology negative hematology ROS (+)   Anesthesia Other Findings Past Medical History: No date: Atrial flutter ([place]) 08/2011: Coronary artery disease     Comment:  a. NSTEMI 08/2011, LHC w/ severe disease of LCx with good              collaterals, lesion was high-risk for intervention due to              a steep angulation of the LCx coming from the left main               coronary artery as well as heavy calcifications, discused              at cath conference w/ consensus advising med Rx No date: GERD (gastroesophageal reflux disease) No date: Hyperlipidemia No date: Hypertension No date: Paroxysmal atrial fibrillation ([place])     Comment:  a. s/p ablation 01/2013; b. prn amio/dilt; c. CHADS2VASc              => 3 (HTN, DM, vascular disease); d. on Coumadin No date: Poorly controlled diabetes mellitus ([place]) No date: RBBB No date: Sleep apnea Past Surgical History: 01/29/2013: ABLATION  Comment:  PVI and CTI by Dr Rayann Heman for atrial flutter and               paroxysmal atrial fibrillation 01/29/2013: ATRIAL FIBRILLATION ABLATION; N/A     Comment:  Procedure: ATRIAL FIBRILLATION ABLATION;  Surgeon: Coralyn Mark, MD;  Location: [facility] CATH LAB;  Service:               Cardiovascular;  Laterality: N/A; 08/03/2011: CARDIAC CATHETERIZATION 02/02/2016: COLONOSCOPY WITH PROPOFOL; N/A     Comment:  Procedure: COLONOSCOPY WITH PROPOFOL;  Surgeon: Lucilla Lame, MD;  Location: [facility] ENDOSCOPY;  Service: Endoscopy;              Laterality: N/A; 01/28/2013: TEE WITHOUT CARDIOVERSION; N/A     Comment:  Procedure: TRANSESOPHAGEAL ECHOCARDIOGRAM (TEE);                Surgeon: Lelon Perla, MD;  Location: [facility] ENDOSCOPY;                Service: Cardiovascular;  Laterality: N/A; BMI    Body Mass Index:  34.17 kg/m     Reproductive/Obstetrics negative OB ROS  Anesthesia Physical Anesthesia Plan  ASA: III  Anesthesia Plan: General ETT   Post-op Pain Management:    Induction:   PONV Risk Score and Plan:   Airway Management Planned:   Additional Equipment:   Intra-op Plan:   Post-operative Plan:   Informed Consent: I have reviewed the patients History and Physical, chart, labs and discussed the procedure including the risks, benefits and alternatives for the proposed anesthesia with the patient or authorized representative who has indicated his/her understanding and acceptance.   Dental Advisory Given  Plan Discussed with: CRNA  Anesthesia Plan Comments:         Anesthesia Quick Evaluation

## 2017-06-07 NOTE — Progress Notes (Signed)
Patient seen in PACU and in recovery directly before discharge. Leg pain present prior to surgery has resolved at this time. Back pain 3-4/10 localized to incision site. Patient has ambulated, voided, and eaten without issue.  Post op instructions and wound care were reviewed with patient and spouse. Understanding was verbalized. All questions and concerns were addressed. Advised to contact office if additional questions or concerns arise.   PE: Strength: 5/5 throughout upper and lower extremities.  Sensation: Intact and symmetric in upper and lower extremities.  Skin: incision site intact. Glue present.

## 2017-06-07 NOTE — Op Note (Signed)
Indications: [name] is a 62 yo male who presented with lumbar stenosis causing neurogenic claudication.  He failed conservative management and elected for surgical intervention.  Findings: lumbar stenosis   Preoperative Diagnosis: Lumbar radiculopathy Postoperative Diagnosis: same   EBL: 50 ml IVF: 1000 ml Drains: none Disposition: Extubated and Stable to PACU Complications: none  No foley catheter was placed.   Preoperative Note:   Risks of surgery discussed include: infection, bleeding, stroke, coma, death, paralysis, CSF leak, nerve/spinal cord injury, numbness, tingling, weakness, complex regional pain syndrome, recurrent stenosis and/or disc herniation, vascular injury, development of instability, neck/back pain, need for further surgery, persistent symptoms, development of deformity, and the risks of anesthesia. The patient understood these risks and agreed to proceed.  Operative Note:    The patient was then brought from the preoperative center with intravenous access established.  The patient underwent general anesthesia and endotracheal tube intubation, and was then rotated on the [place] rail top where all pressure points were appropriately padded.  The skin was then thoroughly cleansed.  Perioperative antibiotic prophylaxis was administered.  Sterile prep and drapes were then applied and a timeout was then observed.  C-arm was brought into the field under sterile conditions, and the L4-5 disc space identified and marked with an incision on the right 1cm lateral to midline.  Once this was complete a 4 cm incision was opened with the use of a #10 blade knife.  The metrics tubes were sequentially advanced under lateral fluoroscopy until a 18 x 80 mm Metrix tube was placed over the facet and lamina and secured to the bed.    The microscope was then sterilely brought into the field and muscle creep was hemostased with a bipolar and resected with a pituitary rongeur.  A Bovie  extender was then used to expose the spinous process and lamina.  Careful attention was placed to not violate the facet capsule. A 3 mm matchstick drill bit was then used to make a hemi-laminotomy trough until the ligamentum flavum was exposed.  This was extended to the base of the spinous process.  Once this was complete and the underlying ligamentum flavum was visualized this was dissected with an up angle curette and resected with a #2 and #3 mm biting Kerrison.  The laminotomy opening was also expanded in similar fashion and hemostasis was obtained with Surgifoam and a patty as well as bone wax.  The rostral aspect of the caudal level of the lamina was also resected with a #2 biting Kerrison effort to further enhance exposure.  Once the underlying dura was visualized a [place] 4 was then used to dissect and expose the traversing nerve root.  Once this was identified a nerve root retractor suction was used to mobilize this medially.  The venous plexus was hemostased with Surgifoam and light bipolar use.  #15 blade knife was then used to make a small annulotomy within the disc space and disc space contents were noted to to come through the annulus.    The disc herniation was identified and dissected free using a balltip probe. The pituitary rongeur was used to remove the extruded disc fragments. Once the thecal sac and nerve root were noted to be relaxed and under less tension the ball-tipped feeler was passed along the foramen distally to to ensure no residual compression was noted.    A Depo-Medrol soaked Gelfoam pledget was placed along the nerve root for 2 minutes and removed.  The area was irrigated. The tube system  was then removed under microscopic visualization and hemostasis was obtained with a bipolar.    Additional attention was paid to completion of the contralateral L4-5 foraminotomy until the left L5 nerve root was completely free.  Once this was complete, L4-5 central decompression including  medial facetectomy and foraminotomy was confirmed and decompression on both sides was confirmed. No CSF leak was noted.  We then turned attention to the L3-4 level. The metrx tubes were sequentially advanced and confirmed in position. An 5mm by 17mm tube was locked in place to the bed side attachment.  Fluoroscopy was then removed from the field.  The microscope was then sterilely brought into the field and muscle creep was hemostased with a bipolar and resected with a pituitary rongeur.  A Bovie extender was then used to expose the spinous process and lamina.  Careful attention was placed to not violate the facet capsule. A 3 mm matchstick drill bit was then used to make a hemi-laminotomy trough until the ligamentum flavum was exposed.  This was extended to the base of the spinous process and to the contralateral side to remove all the central bone from each side.  Once this was complete and the underlying ligamentum flavum was visualized, it was dissected with a curette and resected with Kerrison rongeurs.  Extensive ligamentum hypertrophy was noted, requiring a substantial amount of time and care for removal.  The dura was identified and palpated. The kerrison rongeur was then used to remove the medial facet bilaterally until no compression was noted.  A balltip probe was used to confirm decompression of the right L4 nerve root.  Additional attention was paid to completion of the contralateral L3-4 foraminotomy until the left L4 nerve root was completely free.  Once this was complete, L3-4 central decompression including medial facetectomy and foraminotomy was confirmed and decompression on both sides was confirmed. No CSF leak was noted.  A Depo-Medrol soaked Gelfoam pledget was placed in the defect.  The wound was copiously irrigated. The tube system was then removed under microscopic visualization and hemostasis was obtained with a bipolar.  The fascial layer was reapproximated with the use of a 0-  Vicryl suture.  Subcutaneous tissue layer was reapproximated using 2-0 Vicryl suture.  3-0 monocryl was used on the skin. The skin was then cleansed and Dermabond was used to close the skin opening.  Patient was then rotated back to the preoperative bed awakened from anesthesia and taken to recovery all counts are correct in this case.   I performed the entire procedure with the assistance of Marin Olp PA as an Pensions consultant.  Meade Maw MD

## 2017-06-07 NOTE — Anesthesia Procedure Notes (Addendum)
Procedure Name: Intubation Date/Time: 06/07/2017 7:46 AM Performed by: Nile Riggs, CRNA Pre-anesthesia Checklist: Patient identified, Emergency Drugs available, Suction available, Patient being monitored and Timeout performed Patient Re-evaluated:Patient Re-evaluated prior to induction Oxygen Delivery Method: Circle system utilized Preoxygenation: Pre-oxygenation with 100% oxygen Induction Type: IV induction and Cricoid Pressure applied Ventilation: Mask ventilation without difficulty and Oral airway inserted - appropriate to patient size Laryngoscope Size: Mac and 4 Grade View: Grade II Tube type: Oral Tube size: 7.5 mm Number of attempts: 1 Airway Equipment and Method: Stylet Placement Confirmation: ETT inserted through vocal cords under direct vision,  positive ETCO2,  CO2 detector and breath sounds checked- equal and bilateral Secured at: 22 cm Tube secured with: Tape Dental Injury: Teeth and Oropharynx as per pre-operative assessment

## 2017-06-07 NOTE — H&P (Signed)
  I have reviewed and confirmed my history and physical from 05/18/17 with no additions or changes. Plan for L3-4 decompression and L4-5 discectomy.  Risks and benefits reviewed.  Heart sounds normal no MRG. Chest Clear to Auscultation Bilaterally.

## 2017-06-07 NOTE — Anesthesia Postprocedure Evaluation (Signed)
Anesthesia Post Note  Patient: Douglas Hunter  Procedure(s) Performed: LUMBAR LAMINECTOMY/DECOMPRESSION MICRODISCECTOMY 2 LEVELS-L3-4,L4-5 (N/A Spine Lumbar)  Patient location during evaluation: PACU Anesthesia Type: General Level of consciousness: awake and alert Pain management: pain level controlled Vital Signs Assessment: post-procedure vital signs reviewed and stable Respiratory status: spontaneous breathing, nonlabored ventilation, respiratory function stable and patient connected to nasal cannula oxygen Cardiovascular status: blood pressure returned to baseline and stable Postop Assessment: no apparent nausea or vomiting Anesthetic complications: no     Last Vitals:  Vitals:   06/07/17 1115 06/07/17 1127  BP: 126/61   Pulse: 65 (!) 59  Resp: (!) 26 20  Temp: 36.7 C 36.6 C  SpO2: 93% 94%    Last Pain:  Vitals:   06/07/17 1127  TempSrc: Temporal  PainSc: [place]

## 2017-06-07 NOTE — Transfer of Care (Signed)
Immediate Anesthesia Transfer of Care Note  Patient: Douglas Hunter  Procedure(s) Performed: LUMBAR LAMINECTOMY/DECOMPRESSION MICRODISCECTOMY 2 LEVELS-L3-4,L4-5 (N/A Spine Lumbar)  Patient Location: PACU  Anesthesia Type:General  Level of Consciousness: awake, alert , oriented and patient cooperative  Airway & Oxygen Therapy: Patient Spontanous Breathing and Patient connected to face mask oxygen  Post-op Assessment: Report given to RN, Post -op Vital signs reviewed and stable and Patient moving all extremities X 4  Post vital signs: Reviewed and stable  Last Vitals:  Vitals:   06/07/17 0605 06/07/17 1015  BP: (!) 151/79   Pulse: (!) 52   Resp: 18   Temp: 36.6 C 36.7 C  SpO2: 99%     Last Pain:  Vitals:   06/07/17 0605  TempSrc: Oral  PainSc: 4          Complications: No apparent anesthesia complications

## 2017-06-07 NOTE — Anesthesia Post-op Follow-up Note (Signed)
Anesthesia QCD[facility] form completed.        

## 2017-06-07 NOTE — OR Nursing (Signed)
To BR 50cc clear yellow urine.  Marin Olp, PA in to see pt, advises 50cc void ok.  Per Dr. Cari Caraway verbal, pt not to resume warfarin, he will be instructed when to resume when back to office for follow up appt.

## 2017-06-07 NOTE — OR Nursing (Signed)
ambul to BR - unable to void - back to room, toler well.

## 2017-06-07 NOTE — Discharge Instructions (Addendum)
°Your surgeon has performed an operation on your lumbar spine (low back) to relieve pressure on one or more nerves. Many times, patients feel better immediately after surgery and can “overdo it.” Even if you feel well, it is important that you follow these activity guidelines. If you do not let your back heal properly from the surgery, you can increase the chance of a disc herniation and/or return of your symptoms. The following are instructions to help in your recovery once you have been discharged from the hospital. ° °* Do not take anti-inflammatory medications for 3 days after surgery (naproxen [Aleve], ibuprofen [Advil, Motrin], celecoxib [Celebrex], etc.) ° °Activity  °  °No bending, lifting, or twisting (“BLT”). Avoid lifting objects heavier than 10 pounds (gallon milk jug).  Where possible, avoid household activities that involve lifting, bending, pushing, or pulling such as laundry, vacuuming, grocery shopping, and childcare. Try to arrange for help from friends and family for these activities while your back heals. ° °Increase physical activity slowly as tolerated.  Taking short walks is encouraged, but avoid strenuous exercise. Do not jog, run, bicycle, lift weights, or participate in any other exercises unless specifically allowed by your doctor. Avoid prolonged sitting, including car rides. ° °Talk to your doctor before resuming sexual activity. ° °You should not drive until cleared by your doctor. ° °Until released by your doctor, you should not return to work or school.  You should rest at home and let your body heal.  ° °You may shower two days after your surgery.  After showering, lightly dab your incision dry. Do not take a tub bath or go swimming for 3 weeks, or until approved by your doctor at your follow-up appointment. ° °If you smoke, we strongly recommend that you quit.  Smoking has been proven to interfere with normal healing in your back and will dramatically reduce the success rate of  your surgery. Please contact [facility] (800-QUIT-NOW) and use the resources at www.[facility].com for assistance in stopping smoking. ° °Surgical Incision °  °If you have a dressing on your incision, you may remove it three days after your surgery. Keep your incision area clean and dry. ° °If you have staples or stitches on your incision, you should have a follow up scheduled for removal. If you do not have staples or stitches, you will have steri-strips (small pieces of surgical tape) or Dermabond glue. The steri-strips/glue should begin to peel away within about a week (it is fine if the steri-strips fall off before then). If the strips are still in place one week after your surgery, you may gently remove them. ° °Diet          ° ° You may return to your usual diet. Be sure to stay hydrated. ° °When to Contact Us ° °Although your surgery and recovery will likely be uneventful, you may have some residual numbness, aches, and pains in your back and/or legs. This is normal and should improve in the next few weeks. ° °However, should you experience any of the following, contact us immediately: °• New numbness or weakness °• Pain that is progressively getting worse, and is not relieved by your pain medications or rest °• Bleeding, redness, swelling, pain, or drainage from surgical incision °• Chills or flu-like symptoms °• Fever greater than 101.0 F (38.3 C) °• Problems with bowel or bladder functions °• Difficulty breathing or shortness of breath °• Warmth, tenderness, or swelling in your calf ° °Contact Information °• During office hours (Monday-Friday   9 am to 5 pm), please call your physician at [phone] °• After hours and weekends, please call the [facility] at [phone] and ask for the Neurosurgery Resident On Call  °• For a life-threatening emergency, call 911 ° °AMBULATORY SURGERY  °DISCHARGE INSTRUCTIONS ° ° °1) The drugs that you were given will stay in your system until tomorrow so for the next 24  hours you should not: ° °A) Drive an automobile °B) Make any legal decisions °C) Drink any alcoholic beverage ° ° °2) You may resume regular meals tomorrow.  Today it is better to start with liquids and gradually work up to solid foods. ° °You may eat anything you prefer, but it is better to start with liquids, then soup and crackers, and gradually work up to solid foods. ° ° °3) Please notify your doctor immediately if you have any unusual bleeding, trouble breathing, redness and pain at the surgery site, drainage, fever, or pain not relieved by medication. ° ° ° °4) Additional Instructions: ° °Please contact your physician with any problems or Same Day Surgery at [phone], Monday through Friday 6 am to 4 pm, or [place] [facility] [place] number at [phone]. ° °

## 2017-06-26 ENCOUNTER — Ambulatory Visit (INDEPENDENT_AMBULATORY_CARE_PROVIDER_SITE_OTHER): Payer: 59

## 2017-06-26 DIAGNOSIS — I480 Paroxysmal atrial fibrillation: Secondary | ICD-10-CM | POA: Diagnosis not present

## 2017-06-26 DIAGNOSIS — Z5181 Encounter for therapeutic drug level monitoring: Secondary | ICD-10-CM | POA: Diagnosis not present

## 2017-06-26 DIAGNOSIS — I4892 Unspecified atrial flutter: Secondary | ICD-10-CM

## 2017-06-26 LAB — POCT INR: INR: 2.6

## 2017-06-26 NOTE — Patient Instructions (Signed)
Continue taking 1/2 tablet daily except 1 tablet on Mondays, Wednesdays, and Fridays.   Recheck in 6 weeks.

## 2017-07-02 NOTE — Progress Notes (Signed)
Cardiology Office Note  Date:  07/04/2017   ID:  Douglas, Hunter 02-11-1956, MRN [id number]  PCP:  Sherrin Daisy, MD   Chief Complaint  Patient presents with  . other    Afib. Meds reviewed verbally with pt.    HPI:  Douglas Hunter is a pleasant 62 -year-old gentleman with  CAD,  non-STEMI with admission to the hospital in late April 2012  cardiac catheterization showing severe mid left circumflex disease, subtotally occluded, with collaterals from the RCA to the LAD circumflex, normal ejection fraction who represented to the hospital shortly later with chest pain.   started on medical management,  nitrates and ranexa for his symptoms.  sleep apnea but has not had a sleep study.  episodes of atrial flutter. He reports symptoms dating back into his 50s.  2 recent hospitalizations for atrial flutter, 08/11/2012 and recently on 01/11/2013. Both episodes required hospitalization, medications for cardioversion. He has had several episodes that broke on their own Who presents today for follow-up of his atrial fibrillation  Reports having arrhythmia at least once per month if not twice a month since October 2018 Reports having it twice in March lasting 2-3 hours at a time He is getting tired of having atrial fibrillation wonders if he should restart different medication  Previous ablation with Dr. Rayann Heman,  for typical atrial flutter, atrial fibrillation.  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 compliant with his anticoagulation No regular exercise program Denies any chest pain concerning for angina  EKG personally reviewed by myself on todays visit Shows normal sinus rhythm/sinus bradycardia rate 51 bpm right bundle branch block  Other past medical history Prior Echocardiogram  shows normal LV systolic function, essentially normal study, May 2013  LDL 81, hemoglobin A1c 8.1, total cholesterol 144, HDL 50   PMH:   has a past medical history of Atrial flutter ([place]), Coronary artery disease (08/2011), GERD (gastroesophageal reflux disease), Hyperlipidemia, Hypertension, Paroxysmal atrial fibrillation ([place]), Poorly controlled diabetes mellitus ([place]), RBBB, and Sleep apnea.  PSH:    Past Surgical History:  Procedure Laterality Date  . ABLATION  01/29/2013   PVI and CTI by Dr Rayann Heman for atrial flutter and paroxysmal atrial fibrillation  . ATRIAL FIBRILLATION ABLATION N/A 01/29/2013   Procedure: ATRIAL FIBRILLATION ABLATION;  Surgeon: Coralyn Mark, MD;  Location: [facility];  Service: Cardiovascular;  Laterality: N/A;  . CARDIAC CATHETERIZATION  08/03/2011  . COLONOSCOPY WITH PROPOFOL N/A 02/02/2016   Procedure: COLONOSCOPY WITH PROPOFOL;  Surgeon: Lucilla Lame, MD;  Location: [facility] ENDOSCOPY;  Service: Endoscopy;  Laterality: N/A;  . LUMBAR LAMINECTOMY/DECOMPRESSION MICRODISCECTOMY N/A 06/07/2017   Procedure: LUMBAR LAMINECTOMY/DECOMPRESSION MICRODISCECTOMY 2 LEVELS-L3-4,L4-5;  Surgeon: Meade Maw, MD;  Location: [facility] ORS;  Service: Neurosurgery;  Laterality: N/A;  . TEE WITHOUT CARDIOVERSION N/A 01/28/2013   Procedure: TRANSESOPHAGEAL ECHOCARDIOGRAM (TEE);  Surgeon: Lelon Perla, MD;  Location: [facility] ENDOSCOPY;  Service: Cardiovascular;  Laterality: N/A;    Current Outpatient Medications  Medication Sig Dispense Refill  . Acetaminophen 500 MG coapsule Take by mouth.    Marland Kitchen atorvastatin (LIPITOR) 80 MG tablet Take 80 mg by mouth daily at 6 PM.   0  . benazepril (LOTENSIN) 40 MG tablet TAKE 1 TABLET(40 MG) BY MOUTH DAILY 90 tablet 0  . ezetimibe (ZETIA) 10 MG tablet TAKE 1  TABLET(10 MG) BY MOUTH DAILY 30 tablet 3  . furosemide (LASIX) 20 MG tablet Take 1 tablet (20 mg total) by mouth daily. 90 tablet 3  . insulin NPH-regular Human (HUMULIN 70/30) (70-30) 100  UNIT/ML injection Inject 60 Units into the skin 2 (two) times daily with a meal. 30 mL 12  . INSULIN SYRINGE 1CC/29G 29G X 1/2" 1 ML MISC USE AS DIRECTED 100 each PRN  . isosorbide mononitrate (IMDUR) 30 MG 24 hr tablet TAKE 1 TABLET BY MOUTH EVERY DAY. 30 tablet 6  . magnesium oxide (MAG-OX) 400 MG tablet Take 1 tablet (400 mg total) by mouth 2 (two) times daily. 180 tablet 3  . metFORMIN (GLUCOPHAGE) 500 MG tablet TAKE 1 TABLET(500 MG) BY MOUTH FOUR TIMES DAILY (Patient taking differently: TAKE 1 TABLET(500 MG) BY MOUTH TWO TIMES DAILY) 360 tablet 0  . methocarbamol (ROBAXIN) 500 MG tablet Take 1 tablet (500 mg total) by mouth every 6 (six) hours as needed for muscle spasms. 90 tablet 0  . metoprolol tartrate (LOPRESSOR) 25 MG tablet Take 1 tablet (25 mg) by mouth twice daily, you may also take an extra 25 mg daily if you have an episode of a-fib    . nitroGLYCERIN (NITROSTAT) 0.4 MG SL tablet Place 1 tablet (0.4 mg total) under the tongue every 5 (five) minutes as needed for chest pain. 25 tablet 6  . omeprazole (PRILOSEC) 20 MG capsule Take 1 capsule (20 mg total) by mouth 2 (two) times daily before a meal. (Patient taking differently: Take 20 mg by mouth daily. ) 180 capsule 3  . ONE TOUCH ULTRA TEST test strip TEST THREE TIMES A DAY AS DIRECTED. 100 each 12  . oxyCODONE (ROXICODONE) 5 MG immediate release tablet Take 1 tablet (5 mg total) by mouth every 4 (four) hours as needed for breakthrough pain. 30 tablet 0  . sildenafil (REVATIO) 20 MG tablet Take 1 tablet (20 mg total) by mouth 3 (three) times daily as needed. 90 tablet 3  . VIAGRA 100 MG tablet TAKE 1 TABLET BY MOUTH EVERY DAY AS NEEDED 10 tablet 0   No current facility-administered medications for this visit.      Allergies:   Patient has no known allergies.   Social History:  The patient  reports that he quit smoking about 9 years ago. His smoking use included cigarettes. He has a 52.50 pack-year smoking history. He has never used  smokeless tobacco. He reports that he drinks alcohol. He reports that he does not use drugs.   Family History:   family history includes Cancer in his father; Diabetes in his son; Heart attack in his maternal uncle; Heart attack (age of onset: 56) in his father; Heart disease in his brother, mother, and paternal grandfather; Stroke in his mother.    Review of Systems: Review of Systems  Constitutional: Negative.   Respiratory: Negative.   Cardiovascular: Positive for palpitations.       Tachycardia  Gastrointestinal: Negative.   Musculoskeletal: Negative.   Neurological: Negative.   Psychiatric/Behavioral: Negative.   All other systems reviewed and are negative.    PHYSICAL EXAM: VS:  BP 128/60 (BP Location: Left Arm, Patient Position: Sitting, Cuff Size: Normal)   Pulse (!) 51   Ht 5\' 11"  (1.803 m)   Wt 238 lb 8 oz (108.2 kg)   BMI 33.26 kg/m  , BMI Body mass index is 33.26 kg/m. Constitutional:  oriented to person, place, and time. No distress.  HENT:  Head: Normocephalic  and atraumatic.  Eyes:  no discharge. No scleral icterus.  Neck: Normal range of motion. Neck supple. No JVD present.  Cardiovascular: Normal rate, regular rhythm, normal heart sounds and intact distal pulses. Exam reveals no gallop and no friction rub. No edema No murmur heard. Pulmonary/Chest: Effort normal and breath sounds normal. No stridor. No respiratory distress.  no wheezes.  no rales.  no tenderness.  Abdominal: Soft.  no distension.  no tenderness.  Musculoskeletal: Normal range of motion.  no  tenderness or deformity.  Neurological:  normal muscle tone. Coordination normal. No atrophy Skin: Skin is warm and dry. No rash noted. not diaphoretic.  Psychiatric:  normal mood and affect. behavior is normal. Thought content normal.    Recent Labs: 10/26/2016: ALT 30; TSH 4.190 11/28/2016: Magnesium 1.7 05/31/2017: BUN 18; Creatinine, Ser 0.96; Hemoglobin 13.9; Platelets 240; Potassium 4.2; Sodium 135     Lipid Panel Lab Results  Component Value Date   CHOL 162 01/13/2015   HDL 47 01/12/2013   LDLCALC 49 01/12/2013   TRIG 62 01/13/2015      Wt Readings from Last 3 Encounters:  07/04/17 238 lb 8 oz (108.2 kg)  06/07/17 245 lb (111.1 kg)  05/31/17 245 lb (111.1 kg)     ASSESSMENT AND PLAN:  Paroxysmal atrial fibrillation (HCC) - Plan: EKG 12-Lead  compliant with his warfarin,  He is taking extra metoprolol as needed for breakthrough atrial fibrillation But still bothered by his recurrent arrhythmia Heart rate is low and we will not be able to do amiodarone load Recommend he start amiodarone 200 mg daily, monitor heart rate Stay on metoprolol if symptoms get worse we would order event monitor to determine if he is having flutter or fibrillation  Coronary artery disease involving native coronary artery of native heart without angina pectoris Currently with no symptoms of angina. No further workup at this time. Continue current medication regimen. Stable  Essential hypertension Blood pressure is well controlled on today's visit. No changes made to the medications. Stable  Mixed hyperlipidemia Cholesterol at goal LDL less than 70 Managed by primary care  Type 2 diabetes mellitus with other circulatory complication, with long-term current use of insulin ([place]) We have encouraged continued exercise, careful diet management in an effort to lose weight.   Total encounter time more than 25 minutes  Greater than 50% was spent in counseling and coordination of care with the patient  Disposition:   F/U  12 months   Orders Placed This Encounter  Procedures  . EKG 12-Lead     Signed, Esmond Plants, M.D., Ph.D. 07/04/2017  [place], [place]

## 2017-07-04 ENCOUNTER — Encounter: Payer: Self-pay | Admitting: Cardiovascular Disease

## 2017-07-04 ENCOUNTER — Ambulatory Visit (INDEPENDENT_AMBULATORY_CARE_PROVIDER_SITE_OTHER): Payer: 59 | Admitting: Cardiovascular Disease

## 2017-07-04 VITALS — BP 128/60 | HR 51 | Ht 71.0 in | Wt 238.5 lb

## 2017-07-04 DIAGNOSIS — E1165 Type 2 diabetes mellitus with hyperglycemia: Secondary | ICD-10-CM | POA: Diagnosis not present

## 2017-07-04 DIAGNOSIS — I10 Essential (primary) hypertension: Secondary | ICD-10-CM | POA: Diagnosis not present

## 2017-07-04 DIAGNOSIS — I4892 Unspecified atrial flutter: Secondary | ICD-10-CM | POA: Diagnosis not present

## 2017-07-04 DIAGNOSIS — E782 Mixed hyperlipidemia: Secondary | ICD-10-CM

## 2017-07-04 DIAGNOSIS — I480 Paroxysmal atrial fibrillation: Secondary | ICD-10-CM

## 2017-07-04 DIAGNOSIS — R079 Chest pain, unspecified: Secondary | ICD-10-CM | POA: Diagnosis not present

## 2017-07-04 MED ORDER — AMIODARONE HCL 200 MG PO TABS: 200.0000 mg | ORAL_TABLET | Freq: Every day | ORAL | 3 refills | Status: DC

## 2017-07-04 NOTE — Patient Instructions (Addendum)
Medication Instructions:   Please start amiodarone one a day  If you have atrial fib episode, Take additional metoprolol and amiodarone  Labwork:  No new labs needed  Testing/Procedures:  No further testing at this time   Follow-Up: It was a pleasure seeing you in the office today. Please call us if you have new issues that need to be addressed before your next appt.  [phone]  Your physician wants you to follow-up in: 12 months.  You will receive a reminder letter in the mail two months in advance. If you don't receive a letter, please call our office to schedule the follow-up appointment.  If you need a refill on your cardiac medications before your next appointment, please call your pharmacy.  For educational health videos Log in to : [email or web address] Or : [email or web address], password : [email or web address]

## 2017-07-06 ENCOUNTER — Other Ambulatory Visit: Payer: Self-pay | Admitting: Cardiovascular Disease

## 2017-07-06 ENCOUNTER — Telehealth: Payer: Self-pay | Admitting: Cardiovascular Disease

## 2017-07-06 MED ORDER — AMIODARONE HCL 200 MG PO TABS: 200.0000 mg | ORAL_TABLET | Freq: Every day | ORAL | 3 refills | Status: DC

## 2017-07-06 NOTE — Telephone Encounter (Signed)
Pt calling stating we placed him back on Amiodarone He states when we sent in it, we sent it in as a pacerone  He is calling stating insurance will not cover this  Please advise

## 2017-07-06 NOTE — Telephone Encounter (Signed)
Patient states that when he went to pick up his medication that it was not covered by his insurance. Advised that I would check orders and then resend prescription if needed. He verbalized understanding with no further questions at this time.

## 2017-07-06 NOTE — Telephone Encounter (Signed)
Called patient and let him know that we sent in new prescription and to please give me a call back if he should have any further problems. He was appreciative for the call back with no further questions.

## 2017-07-06 NOTE — Telephone Encounter (Signed)
Refill Request.  

## 2017-07-06 NOTE — Telephone Encounter (Signed)
Please see note insurance will not cover [place].

## 2017-07-18 ENCOUNTER — Encounter: Payer: Self-pay | Admitting: Internal Medicine

## 2017-07-18 NOTE — Progress Notes (Signed)
[facility] Consultation      Assessment and Plan:  The patient is a 62 year old male with symptoms and signs of obstructive sleep apnea.  He also has a history of atrial fibrillation, diabetes mellitus, GERD, coronary artery disease  Obstructive sleep apnea --Obstructive Sleep Apnea, AHI of 30 (Severe OSA) on Auto-CPAP with pressure settings of 5-20 cm H2O.  -Has trouble adjusting to the pressure. We will change auto settings to 5-15 from  7-15 centimeters of water.  Insomnia --Sleep initiation and maintenance.  --asked to start OTC melatonin.   Diabetes mellitus, atrial fibrillation, coronary artery disease, GERD. --Above conditions can be affected by OSA, therefore it is important to see if they improve after starting on PAP.  --BP mildly elevated today, following up with cardiology soon.   Former smoker.  --Referred to lung cancer screening, but could not afford it.    Date: 07/18/2017  MRN# [id number] Douglas Hunter Dec 09, 1955    Douglas Hunter is a 62 y.o. old male seen in consultation for chief complaint of:    Chief Complaint  Patient presents with  . Sleep Apnea    pt having trouble cpap    HPI:   The patient is a 62 year old male with OSA. He is feeling much better, he is much more rested in the am and feeling "like a different person". He is no longer snoring.  He was previously having insomnia with difficulty initiating and maintaining sleep, these issues have resolved since starting on CPAP. At last visit his pressure was changed to 7-15 cm H2O. He notes that when he gets up to go to the bathroom and then comes back the pressure is too high. He has tried the ramp but not sure that it helps.  He also has trouble falling asleep as well as going back to sleep.   **Personally Reviewed download data 30 days as of 07/18/17; avg uses greater than 4 hours a 16/30 days.  Average usage on days used is 4 hours 5 minutes.  Auto set 7-15.  Median  pressure 8, 95th percentile pressure 10, maximum pressure 11.  Residual AHI is 1.  This shows less than ideal usage, with excellent control of sleep apnea on CPAP. **Reviewed 3 days download data as of 02/21/17, usage greater than 4 hours is 100%.  Average usage is 5 hours 56 minutes.  Current setting is auto 5-20.  Median pressure 7, 95th percentile pressure is 10, maximum is 12.  Residual AHI is 1.3.  Social Hx:   Social History   Tobacco Use  . Smoking status: Former Smoker    Packs/day: 1.50    Years: 35.00    Pack years: 52.50    Types: Cigarettes    Last attempt to quit: 01/16/2008    Years since quitting: 9.5  . Smokeless tobacco: Never Used  Substance Use Topics  . Alcohol use: Yes    Comment: occasional  . Drug use: No   Medication:    Current Outpatient Medications:  .  Acetaminophen 500 MG coapsule, Take by mouth., Disp: , Rfl:  .  amiodarone (PACERONE) 200 MG tablet, Take 1 tablet (200 mg total) by mouth daily., Disp: 90 tablet, Rfl: 3 .  atorvastatin (LIPITOR) 80 MG tablet, Take 80 mg by mouth daily at 6 PM. , Disp: , Rfl: 0 .  benazepril (LOTENSIN) 40 MG tablet, TAKE 1 TABLET(40 MG) BY MOUTH DAILY, Disp: 90 tablet, Rfl: 0 .  ezetimibe (ZETIA) 10 MG tablet, TAKE  1 TABLET(10 MG) BY MOUTH DAILY, Disp: 30 tablet, Rfl: 3 .  furosemide (LASIX) 20 MG tablet, Take 1 tablet (20 mg total) by mouth daily., Disp: 90 tablet, Rfl: 3 .  insulin NPH-regular Human (HUMULIN 70/30) (70-30) 100 UNIT/ML injection, Inject 60 Units into the skin 2 (two) times daily with a meal., Disp: 30 mL, Rfl: 12 .  INSULIN SYRINGE 1CC/29G 29G X 1/2" 1 ML MISC, USE AS DIRECTED, Disp: 100 each, Rfl: PRN .  isosorbide mononitrate (IMDUR) 30 MG 24 hr tablet, TAKE 1 TABLET BY MOUTH EVERY DAY., Disp: 30 tablet, Rfl: 6 .  magnesium oxide (MAG-OX) 400 MG tablet, Take 1 tablet (400 mg total) by mouth 2 (two) times daily., Disp: 180 tablet, Rfl: 3 .  metFORMIN (GLUCOPHAGE) 500 MG tablet, TAKE 1 TABLET(500 MG) BY  MOUTH FOUR TIMES DAILY (Patient taking differently: TAKE 1 TABLET(500 MG) BY MOUTH TWO TIMES DAILY), Disp: 360 tablet, Rfl: 0 .  methocarbamol (ROBAXIN) 500 MG tablet, Take 1 tablet (500 mg total) by mouth every 6 (six) hours as needed for muscle spasms., Disp: 90 tablet, Rfl: 0 .  metoprolol tartrate (LOPRESSOR) 25 MG tablet, Take 1 tablet (25 mg) by mouth twice daily, you may also take an extra 25 mg daily if you have an episode of a-fib, Disp: , Rfl:  .  nitroGLYCERIN (NITROSTAT) 0.4 MG SL tablet, Place 1 tablet (0.4 mg total) under the tongue every 5 (five) minutes as needed for chest pain., Disp: 25 tablet, Rfl: 6 .  omeprazole (PRILOSEC) 20 MG capsule, Take 1 capsule (20 mg total) by mouth 2 (two) times daily before a meal. (Patient taking differently: Take 20 mg by mouth daily. ), Disp: 180 capsule, Rfl: 3 .  ONE TOUCH ULTRA TEST test strip, TEST THREE TIMES A DAY AS DIRECTED., Disp: 100 each, Rfl: 12 .  oxyCODONE (ROXICODONE) 5 MG immediate release tablet, Take 1 tablet (5 mg total) by mouth every 4 (four) hours as needed for breakthrough pain., Disp: 30 tablet, Rfl: 0 .  sildenafil (REVATIO) 20 MG tablet, Take 1 tablet (20 mg total) by mouth 3 (three) times daily as needed., Disp: 90 tablet, Rfl: 3 .  VIAGRA 100 MG tablet, TAKE 1 TABLET BY MOUTH EVERY DAY AS NEEDED, Disp: 10 tablet, Rfl: 0 .  warfarin (COUMADIN) 5 MG tablet, TAKE 1 TABLET BY MOUTH DAILY AS DIRECTED BY COUMADIN CLINIC, Disp: 30 tablet, Rfl: 0   Allergies:  Patient has no known allergies.  Review of Systems: Gen:  Denies  fever, sweats, chills HEENT: Denies blurred vision, double vision. bleeds, sore throat Cvc:  No dizziness, chest pain. Resp:   Denies cough or sputum production, shortness of breath Gi: Denies swallowing difficulty, stomach pain. Gu:  Denies bladder incontinence, burning urine Ext:   No Joint pain, stiffness. Skin: No skin rash,  hives  Endoc:  No polyuria, polydipsia. Psych: No depression,  insomnia. Other:  All other systems were reviewed with the patient and were negative other that what is mentioned in the HPI.   Physical Examination:   VS: BP 122/72 (BP Location: Left Arm, Cuff Size: Normal)   Pulse (!) 54   Resp 16   Ht 5\' 11"  (1.803 m)   Wt 246 lb (111.6 kg)   SpO2 97%   BMI 34.31 kg/m   General Appearance: No distress  Neuro:without focal findings,  speech normal,  HEENT: PERRLA, EOM intact.  [place] 3.  Pulmonary: normal breath sounds, No wheezing.  CardiovascularNormal S1,S2.  No  m/r/g.   Abdomen: Benign, Soft, non-tender. Renal:  No costovertebral tenderness  GU:  No performed at this time. Endoc: No evident thyromegaly, no signs of acromegaly. Skin:   warm, no rashes, no ecchymosis  Extremities: normal, no cyanosis, clubbing.  Other findings:    LABORATORY PANEL:   CBC No results for input(s): WBC, HGB, HCT, PLT in the last 168 hours. ------------------------------------------------------------------------------------------------------------------  Chemistries  No results for input(s): NA, K, CL, CO2, GLUCOSE, BUN, CREATININE, CALCIUM, MG, AST, ALT, ALKPHOS, BILITOT in the last 168 hours.  Invalid input(s): GFRCGP ------------------------------------------------------------------------------------------------------------------  Cardiac Enzymes No results for input(s): TROPONINI in the last 168 hours. ------------------------------------------------------------  RADIOLOGY:  No results found.     Thank  you for the consultation and for allowing [facility] to assist in the care of your patient. Our recommendations are noted above.  Please contact us if we can be of further service.   Marda Stalker, MD.  Board Certified in Internal Medicine, Pulmonary Medicine, [place], and Sleep Medicine.  [facility] Office Number: [phone]  Patricia Pesa, M.D.  Merton Border,  M.D  07/18/2017

## 2017-07-19 ENCOUNTER — Encounter: Payer: Self-pay | Admitting: Internal Medicine

## 2017-07-19 ENCOUNTER — Ambulatory Visit (INDEPENDENT_AMBULATORY_CARE_PROVIDER_SITE_OTHER): Payer: 59 | Admitting: Internal Medicine

## 2017-07-19 VITALS — BP 122/72 | HR 54 | Resp 16 | Ht 71.0 in | Wt 246.0 lb

## 2017-07-19 DIAGNOSIS — E669 Obesity, unspecified: Secondary | ICD-10-CM | POA: Insufficient documentation

## 2017-07-19 DIAGNOSIS — G4733 Obstructive sleep apnea (adult) (pediatric): Secondary | ICD-10-CM | POA: Diagnosis not present

## 2017-07-19 NOTE — Patient Instructions (Addendum)
Will lower CPAP pressure to 5-15 cm H2O.  Take over the counter melatonin supplement every night for sleep.

## 2017-08-01 ENCOUNTER — Encounter: Payer: Self-pay | Admitting: Internal Medicine

## 2017-08-06 ENCOUNTER — Other Ambulatory Visit: Payer: Self-pay | Admitting: Cardiovascular Disease

## 2017-08-07 ENCOUNTER — Ambulatory Visit (INDEPENDENT_AMBULATORY_CARE_PROVIDER_SITE_OTHER): Payer: 59

## 2017-08-07 DIAGNOSIS — I4892 Unspecified atrial flutter: Secondary | ICD-10-CM

## 2017-08-07 DIAGNOSIS — I480 Paroxysmal atrial fibrillation: Secondary | ICD-10-CM | POA: Diagnosis not present

## 2017-08-07 DIAGNOSIS — Z5181 Encounter for therapeutic drug level monitoring: Secondary | ICD-10-CM | POA: Diagnosis not present

## 2017-08-07 LAB — POCT INR: INR: 3.1

## 2017-08-07 NOTE — Patient Instructions (Signed)
Please have a serving of greens today and continue taking 1/2 tablet daily except 1 tablet on Mondays, Wednesdays, and Fridays.   Recheck in 6 weeks. 

## 2017-08-07 NOTE — Telephone Encounter (Signed)
Please review for refill. Thanks!  

## 2017-08-14 ENCOUNTER — Other Ambulatory Visit: Payer: Self-pay | Admitting: Physician Assistant

## 2017-08-14 ENCOUNTER — Other Ambulatory Visit: Payer: Self-pay | Admitting: Cardiovascular Disease

## 2017-08-17 ENCOUNTER — Emergency Department
Admission: EM | Admit: 2017-08-17 | Discharge: 2017-08-17 | Disposition: A | Discharge status: Home / Self Care | Payer: 59 | Attending: Emergency Medicine | Admitting: Emergency Medicine

## 2017-08-17 ENCOUNTER — Telehealth: Payer: Self-pay | Admitting: *Deleted

## 2017-08-17 ENCOUNTER — Other Ambulatory Visit: Payer: Self-pay

## 2017-08-17 ENCOUNTER — Emergency Department: Payer: 59

## 2017-08-17 DIAGNOSIS — Z79899 Other long term (current) drug therapy: Secondary | ICD-10-CM | POA: Diagnosis not present

## 2017-08-17 DIAGNOSIS — Z87891 Personal history of nicotine dependence: Secondary | ICD-10-CM | POA: Diagnosis not present

## 2017-08-17 DIAGNOSIS — I10 Essential (primary) hypertension: Secondary | ICD-10-CM | POA: Diagnosis not present

## 2017-08-17 DIAGNOSIS — E119 Type 2 diabetes mellitus without complications: Secondary | ICD-10-CM | POA: Diagnosis not present

## 2017-08-17 DIAGNOSIS — Z794 Long term (current) use of insulin: Secondary | ICD-10-CM | POA: Diagnosis not present

## 2017-08-17 DIAGNOSIS — Z7901 Long term (current) use of anticoagulants: Secondary | ICD-10-CM | POA: Diagnosis not present

## 2017-08-17 DIAGNOSIS — R0602 Shortness of breath: Secondary | ICD-10-CM

## 2017-08-17 DIAGNOSIS — R0789 Other chest pain: Secondary | ICD-10-CM | POA: Insufficient documentation

## 2017-08-17 DIAGNOSIS — J810 Acute pulmonary edema: Secondary | ICD-10-CM | POA: Insufficient documentation

## 2017-08-17 DIAGNOSIS — I2510 Atherosclerotic heart disease of native coronary artery without angina pectoris: Secondary | ICD-10-CM | POA: Insufficient documentation

## 2017-08-17 DIAGNOSIS — R0989 Other specified symptoms and signs involving the circulatory and respiratory systems: Secondary | ICD-10-CM

## 2017-08-17 LAB — CBC
HEMATOCRIT: 39.3 % — AB (ref 40.0–52.0)
Hemoglobin: 13.2 g/dL (ref 13.0–18.0)
MCH: 31.2 pg (ref 26.0–34.0)
MCHC: 33.6 g/dL (ref 32.0–36.0)
MCV: 92.9 fL (ref 80.0–100.0)
PLATELETS: 247 10*3/uL (ref 150–440)
RBC: 4.24 MIL/uL — ABNORMAL LOW (ref 4.40–5.90)
RDW: 14.2 % (ref 11.5–14.5)
WBC: 7.4 10*3/uL (ref 3.8–10.6)

## 2017-08-17 LAB — BASIC METABOLIC PANEL
Anion gap: 7 (ref 5–15)
BUN: 19 mg/dL (ref 6–20)
CALCIUM: 8.8 mg/dL — AB (ref 8.9–10.3)
CO2: 24 mmol/L (ref 22–32)
CREATININE: 1.14 mg/dL (ref 0.61–1.24)
Chloride: 106 mmol/L (ref 101–111)
GFR calc Af Amer: >60 mL/min (ref >60)
Glucose, Bld: 148 mg/dL — ABNORMAL HIGH (ref 65–99)
POTASSIUM: 4.3 mmol/L (ref 3.5–5.1)
SODIUM: 137 mmol/L (ref 135–145)

## 2017-08-17 LAB — BRAIN NATRIURETIC PEPTIDE: B NATRIURETIC PEPTIDE 5: 236 pg/mL — AB (ref 0.0–100.0)

## 2017-08-17 LAB — TROPONIN I: Troponin I: <0.03 ng/mL (ref <0.03)

## 2017-08-17 LAB — PROTIME-INR
INR: 2.72
PROTHROMBIN TIME: 28.6 s — AB (ref 11.4–15.2)

## 2017-08-17 MED ORDER — FUROSEMIDE 10 MG/ML IJ SOLN
40.0000 mg | Freq: Once | INTRAMUSCULAR | Status: AC
  Administered 2017-08-17: 40 mg via INTRAVENOUS
  Filled 2017-08-17: qty 4

## 2017-08-17 MED ORDER — FUROSEMIDE 20 MG PO TABS: 40.0000 mg | ORAL_TABLET | Freq: Every day | ORAL | 2 refills | Status: DC

## 2017-08-17 NOTE — ED Notes (Addendum)
Pt uprite on stretcher in exam room with no distress noted; card monitor placed--SB; resp even/unlab, lungs clear, apical audible & regular, +BS, abd dist/nontender, +PP, -edema; reports mid CP, nonradiating accomp by [facility] since yesterday; wife at bedside; denies any current pain or SHOB; took 1 NTG at 545am with relief

## 2017-08-17 NOTE — Telephone Encounter (Signed)
-----   Message from Valora Corporal, RN sent at 08/17/2017  9:50 AM EDT ----- Could you arrange for this appointment? Thanks  ----- Message ----- From: Wellington Hampshire, MD Sent: 08/17/2017   9:21 AM To: Valora Corporal, RN, Minna Merritts, MD  This is a patient of Dr. Rockey Situ who was in the ED today with atypical chest pain and increased dyspnea.  Troponin negative and BNP was mildly elevated.  Chest x-ray showed mild heart failure.  He improved with the 1 dose of IV furosemide and will be discharged home with increasing oral furosemide to 40 mg daily.  Please arrange a follow-up visit within 1 to 2 weeks.

## 2017-08-17 NOTE — Discharge Instructions (Addendum)
Please follow up closely with Dr. Rockey Situ. You will need further testing and adjusting of your medications. Please increase the lasix as directed.

## 2017-08-17 NOTE — Telephone Encounter (Signed)
Pt is scheduled for 08/22/17 with Dr Rockey Situ

## 2017-08-17 NOTE — ED Provider Notes (Signed)
[facility] Emergency Department Provider Note   ____________________________________________    I have reviewed the triage vital signs and the nursing notes.   HISTORY  Chief Complaint Chest Pain and Shortness of Breath     HPI Douglas Hunter is a 62 y.o. male who presents with complaints of chest pain shortness of breath.  Patient reports at approximately 4 AM he had a brief episode of chest pressure and also felt short of breath.  Patient has a history of coronary artery disease, paroxysmal A. fib, on Coumadin, diabetes, follows with Dr. Rockey Situ of cardiology.  Reports he feels well currently.  No chest pain.  No nausea or vomiting or diaphoresis.  No calf pain or swelling.  No fevers or chills or cough.  No recent travel.   Past Medical History:  Diagnosis Date  . Atrial flutter ([place])   . Coronary artery disease 08/2011   a. NSTEMI 08/2011, LHC w/ severe disease of LCx with good collaterals, lesion was high-risk for intervention due to a steep angulation of the LCx coming from the left main coronary artery as well as heavy calcifications, discused at cath conference w/ consensus advising med Rx  . GERD (gastroesophageal reflux disease)   . Hyperlipidemia   . Hypertension   . Paroxysmal atrial fibrillation ([place])    a. s/p ablation 01/2013; b. prn amio/dilt; c. CHADS2VASc => 3 (HTN, DM, vascular disease); d. on Coumadin  . Poorly controlled diabetes mellitus ([place])   . RBBB   . Sleep apnea     Patient Active Problem List   Diagnosis Date Noted  . Obesity, unspecified 07/19/2017  . Paroxysmal atrial fibrillation (HCC)   . Poorly controlled diabetes mellitus ([place])   . Special screening for malignant neoplasms, colon   . Polyp of sigmoid colon   . Benign neoplasm of ascending colon   . Encounter for therapeutic drug monitoring 01/13/2016  . Erectile dysfunction 12/10/2015  . Gastroesophageal reflux disease without esophagitis 12/03/2015  .  Snoring 01/23/2013  . Atrial flutter ([place]) 08/20/2012  . Chest pain 08/19/2011  . HTN (hypertension) 08/19/2011  . HLD (hyperlipidemia) 08/19/2011  . Coronary artery disease 08/03/2011    Past Surgical History:  Procedure Laterality Date  . ABLATION  01/29/2013   PVI and CTI by Dr Rayann Heman for atrial flutter and paroxysmal atrial fibrillation  . ATRIAL FIBRILLATION ABLATION N/A 01/29/2013   Procedure: ATRIAL FIBRILLATION ABLATION;  Surgeon: Coralyn Mark, MD;  Location: [facility] CATH LAB;  Service: Cardiovascular;  Laterality: N/A;  . CARDIAC CATHETERIZATION  08/03/2011  . COLONOSCOPY WITH PROPOFOL N/A 02/02/2016   Procedure: COLONOSCOPY WITH PROPOFOL;  Surgeon: Lucilla Lame, MD;  Location: [facility] ENDOSCOPY;  Service: Endoscopy;  Laterality: N/A;  . LUMBAR LAMINECTOMY/DECOMPRESSION MICRODISCECTOMY N/A 06/07/2017   Procedure: LUMBAR LAMINECTOMY/DECOMPRESSION MICRODISCECTOMY 2 LEVELS-L3-4,L4-5;  Surgeon: Meade Maw, MD;  Location: [facility] ORS;  Service: Neurosurgery;  Laterality: N/A;  . TEE WITHOUT CARDIOVERSION N/A 01/28/2013   Procedure: TRANSESOPHAGEAL ECHOCARDIOGRAM (TEE);  Surgeon: Lelon Perla, MD;  Location: [facility] ENDOSCOPY;  Service: Cardiovascular;  Laterality: N/A;    Prior to Admission medications   Medication Sig Start Date End Date Taking? Authorizing Provider  amiodarone (PACERONE) 200 MG tablet Take 1 tablet (200 mg total) by mouth daily. 07/06/17  Yes Minna Merritts, MD  atorvastatin (LIPITOR) 80 MG tablet Take 80 mg by mouth daily at 6 PM.  11/19/15  Yes [provider]  benazepril (LOTENSIN) 40 MG tablet TAKE 1 TABLET(40 MG) BY  MOUTH DAILY 02/03/16  Yes Kathrine Haddock, NP  ezetimibe (ZETIA) 10 MG tablet TAKE 1 TABLET(10 MG) BY MOUTH DAILY 08/14/17  Yes Gollan, Kathlene November, MD  insulin NPH-regular Human (HUMULIN 70/30) (70-30) 100 UNIT/ML injection Inject 60 Units into the skin 2 (two) times daily with a meal. 01/13/15  Yes Kathrine Haddock, NP  isosorbide mononitrate (IMDUR)  30 MG 24 hr tablet TAKE 1 TABLET BY MOUTH EVERY DAY 08/14/17  Yes Dunn, Areta Haber, PA-C  magnesium oxide (MAG-OX) 400 MG tablet Take 1 tablet (400 mg total) by mouth 2 (two) times daily. 11/29/16  Yes Dunn, Areta Haber, PA-C  metFORMIN (GLUCOPHAGE) 500 MG tablet TAKE 1 TABLET(500 MG) BY MOUTH FOUR TIMES DAILY Patient taking differently: TAKE 1 TABLET(500 MG) BY MOUTH TWO TIMES DAILY 07/27/16  Yes Kathrine Haddock, NP  metoprolol tartrate (LOPRESSOR) 25 MG tablet Take 1 tablet (25 mg) by mouth twice daily, you may also take an extra 25 mg daily if you have an episode of a-fib Patient taking differently: Take 25 mg by mouth 2 (two) times daily. Take 1 tablet (25 mg) by mouth twice daily, you may also take an extra 25 mg daily if you have an episode of a-fib 12/19/16  Yes Deboraha Sprang, MD  omeprazole (PRILOSEC) 20 MG capsule Take 1 capsule (20 mg total) by mouth 2 (two) times daily before a meal. Patient taking differently: Take 20 mg by mouth daily.  01/13/15  Yes Kathrine Haddock, NP  oxyCODONE (ROXICODONE) 5 MG immediate release tablet Take 1 tablet (5 mg total) by mouth every 4 (four) hours as needed for breakthrough pain. 06/07/17  Yes Marin Olp, PA-C  warfarin (COUMADIN) 5 MG tablet TAKE 1 TABLET BY MOUTH DAILY AS DIRECTED BY COUMADIN CLINIC Patient taking differently: TAKE 5MG  ON MONDAY, WEDNESDAY, AND FRIDAY. TAKE 2.5 ON TUESDAY, THURSDAY, SATURDAY, AND SUNDAY 08/07/17  Yes Gollan, Kathlene November, MD  Acetaminophen 500 MG coapsule Take 1 capsule by mouth every 6 (six) hours as needed for pain.     [provider]  furosemide (LASIX) 20 MG tablet Take 2 tablets (40 mg total) by mouth daily. 08/17/17 11/15/17  Lavonia Drafts, MD  INSULIN SYRINGE 1CC/29G 29G X 1/2" 1 ML MISC USE AS DIRECTED 05/27/15   Kathrine Haddock, NP  methocarbamol (ROBAXIN) 500 MG tablet Take 1 tablet (500 mg total) by mouth every 6 (six) hours as needed for muscle spasms. Patient not taking: Reported on 08/17/2017 06/07/17   Marin Olp,  PA-C  nitroGLYCERIN (NITROSTAT) 0.4 MG SL tablet Place 1 tablet (0.4 mg total) under the tongue every 5 (five) minutes as needed for chest pain. 03/15/16   Minna Merritts, MD  ONE TOUCH ULTRA TEST test strip TEST THREE TIMES A DAY AS DIRECTED. 02/04/15   Kathrine Haddock, NP  sildenafil (REVATIO) 20 MG tablet Take 1 tablet (20 mg total) by mouth 3 (three) times daily as needed. Patient not taking: Reported on 08/17/2017 03/09/17   Minna Merritts, MD  VIAGRA 100 MG tablet TAKE 1 TABLET BY MOUTH EVERY DAY AS NEEDED 10/28/15   Minna Merritts, MD     Allergies Patient has no known allergies.  Family History  Problem Relation Age of Onset  . Heart attack Maternal Uncle   . Heart attack Father 12  . Cancer Father        throat  . Stroke Mother   . Heart disease Mother        pacemaker  . Heart disease Brother  heart murmur  . Diabetes Son   . Heart disease Paternal Grandfather        MI    Social History Social History   Tobacco Use  . Smoking status: Former Smoker    Packs/day: 1.50    Years: 35.00    Pack years: 52.50    Types: Cigarettes    Last attempt to quit: 01/16/2008    Years since quitting: 9.5  . Smokeless tobacco: Never Used  Substance Use Topics  . Alcohol use: Yes    Comment: occasional  . Drug use: No    Review of Systems  Constitutional: No fever/chills Eyes: No visual changes.  ENT: No sore throat. Cardiovascular: As above Respiratory: As above Gastrointestinal: No abdominal pain.  No nausea, no vomiting.   Genitourinary: Negative for hematuria Musculoskeletal: Negative for back pain. Skin: Negative for rash. Neurological: Negative for headaches    ____________________________________________   PHYSICAL EXAM:  VITAL SIGNS: ED Triage Vitals  Enc Vitals Group     BP 08/17/17 0623 (!) 177/75     Pulse Rate 08/17/17 0623 (!) 54     Resp 08/17/17 0623 18     Temp 08/17/17 0623 98.4 F (36.9 C)     Temp Source 08/17/17 0623 Oral      SpO2 08/17/17 0623 98 %     Weight 08/17/17 0624 111.1 kg (245 lb)     Height 08/17/17 0624 1.803 m (5\' 11" )     Head Circumference --      Peak Flow --      Pain Score 08/17/17 0623 3     Pain Loc --      Pain Edu? --      Excl. in [place]? --     Constitutional: Alert and oriented. No acute distress. Pleasant and interactive Eyes: Conjunctivae are normal.   Nose: No congestion/rhinnorhea. Mouth/Throat: Mucous membranes are moist.    Cardiovascular: Normal rate, regular rhythm. Grossly normal heart sounds.  Good peripheral circulation. Respiratory: Normal respiratory effort.  No retractions. Lungs CTAB. Gastrointestinal: Soft and nontender. No distention.  No CVA tenderness. Genitourinary: deferred Musculoskeletal: No lower extremity tenderness nor edema.  Warm and well perfused Neurologic:  Normal speech and language. No gross focal neurologic deficits are appreciated.  Skin:  Skin is warm, dry and intact. No rash noted. Psychiatric: Mood and affect are normal. Speech and behavior are normal.  ____________________________________________   LABS (all labs ordered are listed, but only abnormal results are displayed)  Labs Reviewed  BASIC METABOLIC PANEL - Abnormal; Notable for the following components:      Result Value   Glucose, Bld 148 (*)    Calcium 8.8 (*)    All other components within normal limits  CBC - Abnormal; Notable for the following components:   RBC 4.24 (*)    HCT 39.3 (*)    All other components within normal limits  BRAIN NATRIURETIC PEPTIDE - Abnormal; Notable for the following components:   B Natriuretic Peptide 236.0 (*)    All other components within normal limits  PROTIME-INR - Abnormal; Notable for the following components:   Prothrombin Time 28.6 (*)    All other components within normal limits  TROPONIN I  TROPONIN I   ____________________________________________  EKG  ED ECG REPORT I, Lavonia Drafts, the attending physician, personally  viewed and interpreted this ECG.  Date: 08/17/2017  Rhythm: normal sinus rhythm QRS Axis: normal Intervals: Right bundle branch block ST/T Wave abnormalities: normal Narrative Interpretation: no  evidence of acute ischemia  ____________________________________________  RADIOLOGY  Chest x-ray shows mild pulmonary edema ____________________________________________   PROCEDURES  Procedure(s) performed: No  Procedures   Critical Care performed: No ____________________________________________   INITIAL IMPRESSION / ASSESSMENT AND PLAN / ED COURSE  Pertinent labs & imaging results that were available during my care of the patient were reviewed by me and considered in my medical decision making (see chart for details).  Patient with a history of CAD, diabetes, paroxysmal A. fib presents with brief episode of chest pressure and mild shortness of breath.  No history of CHF but chest x-ray concerning for mild pulmonary edema.  Initial troponin normal.  EKG unremarkable.  Discussed with Dr. Fletcher Anon of cardiology who recommends second troponin, BNP, IV Lasix   Second troponin is normal.  BNP is 236.  Patient has urinated multiple times of her Lasix.  He feels well and has no complaints.  No chest pain or shortness of breath.  Discussed with Dr. Fletcher Anon who agrees with outpatient follow-up  Patient and family are comfortable with plan. Return precautions discussed ___________________________   FINAL CLINICAL IMPRESSION(S) / ED DIAGNOSES  Final diagnoses:  Shortness of breath  Atypical chest pain  Pulmonary vascular congestion        Note:  This document was prepared using Dragon voice recognition software and may include unintentional dictation errors.    Lavonia Drafts, MD 08/17/17 1050

## 2017-08-17 NOTE — ED Triage Notes (Signed)
Pt arrives to ED via POV with c/o CP yesterday and SHOB that started upon waking this morning. Pt No c/o N/V/D, no lightheadedness, dizziness or diaphoresis. Pt reports centralized non-radiating CP. Pt reports previous MI about 2 years ago. Pt is A&O, in NAD; RR even, regular and unlabored.

## 2017-08-17 NOTE — ED Notes (Signed)
Pt ambulatory to bathroom without difficulty.  

## 2017-08-17 NOTE — Telephone Encounter (Signed)
-----   Message from Wellington Hampshire, MD sent at 08/17/2017  9:21 AM EDT ----- This is a patient of Dr. Rockey Situ who was in the ED today with atypical chest pain and increased dyspnea.  Troponin negative and BNP was mildly elevated.  Chest x-ray showed mild heart failure.  He improved with the 1 dose of IV furosemide and will be discharged home with increasing oral furosemide to 40 mg daily.  Please arrange a follow-up visit within 1 to 2 weeks.

## 2017-08-17 NOTE — ED Notes (Signed)
Pt to xray via stretcher accomp by xray tech 

## 2017-08-21 NOTE — Progress Notes (Signed)
Cardiology Office Note  Date:  08/22/2017   ID:  Douglas, Hunter [birth year], MRN [id number]  PCP:  Douglas Daisy, MD   Chief Complaint  Patient presents with  . other    F/u ED chest pain/sob c/o leg weakness getting worse. Meds reviewed verbally with pt.    HPI:  Douglas Hunter is a pleasant 62 -year-old gentleman with  CAD,  non-STEMI with admission to the hospital in late April 2012  cardiac catheterization showing severe mid left circumflex disease, subtotally occluded, with collaterals from the RCA to the LAD circumflex,  normal ejection fraction  hospital shortly later with chest pain.   started on medical management,  nitrates and ranexa for his symptoms.  sleep apnea but has not had a sleep study.  episodes of atrial flutter. He reports symptoms dating back into his 25s.  2 recent hospitalizations for atrial flutter, 08/11/2012 and recently on 01/11/2013. Both episodes required hospitalization, medications for cardioversion. He has had several episodes that broke on their own Who presents today for follow-up of his atrial fibrillation  He reports last week he had some shortness of breath on Wed during the day Did not think much of it  Last Thursday woke up with shortness of breath overnight, Little bit of chest tightness5/16/2019 Has been drinking lots of Gatorade Went to the emergency room Hospital records reviewed with the patient in detail CXR pulm edema Reports that his Lasix dose was increased up to 40 daily Since then has cut back on his Gatorade  Other issues include chronic leg weakness Etiology unclear wonders if it is one of his medications No regular exercise program Reports having leg weakness previously from back issue but had back surgery 2018 and had improvement of his symptoms now seems to be coming back again  Taking amiodarone 100 mg daily, felt bradycardia can tired on 200 mg daily No significant episodes ofatrial fib and flutter  EKG  personally reviewed by myself on todays visit Shows sinus bradycardia rate 53 bpm by bundle branch block  Other past medical history reviewed Previous ablation with Dr. Rayann Hunter,  for typical atrial flutter, atrial fibrillation.  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 compliant with his anticoagulation No regular exercise program Denies any chest pain concerning for angina  EKG personally reviewed by myself on todays visit Shows normal sinus rhythm/sinus bradycardia rate 51 bpm right bundle branch block  Other past medical history Prior Echocardiogram shows normal LV systolic function, essentially normal study, May 2013  LDL 81, hemoglobin A1c 8.1, total cholesterol 144, HDL 50   PMH:   has a past medical history of Atrial flutter ([place]), Coronary artery disease (08/2011), GERD (gastroesophageal reflux disease), Hyperlipidemia, Hypertension, Paroxysmal atrial fibrillation ([place]), Poorly controlled diabetes mellitus ([place]), RBBB, and Sleep apnea.  PSH:    Past Surgical History:  Procedure Laterality Date  . ABLATION  01/29/2013   PVI and CTI by Dr Douglas Hunter for atrial flutter and paroxysmal atrial fibrillation  . ATRIAL FIBRILLATION ABLATION N/A 01/29/2013   Procedure: ATRIAL FIBRILLATION ABLATION;  Surgeon: Douglas Mark, MD;  Location: [facility] CATH LAB;  Service: Cardiovascular;  Laterality: N/A;  . CARDIAC CATHETERIZATION  08/03/2011  . COLONOSCOPY WITH PROPOFOL N/A 02/02/2016   Procedure: COLONOSCOPY WITH PROPOFOL;  Surgeon: Douglas Lame, MD;  Location: [facility] ENDOSCOPY;  Service: Endoscopy;  Laterality: N/A;  . LUMBAR LAMINECTOMY/DECOMPRESSION MICRODISCECTOMY N/A 06/07/2017   Procedure: LUMBAR LAMINECTOMY/DECOMPRESSION MICRODISCECTOMY 2 LEVELS-L3-4,L4-5;  Surgeon: Douglas Maw, MD;   Location: [facility] ORS;  Service: Neurosurgery;  Laterality: N/A;  . TEE WITHOUT CARDIOVERSION N/A 01/28/2013   Procedure: TRANSESOPHAGEAL ECHOCARDIOGRAM (TEE);  Surgeon: Douglas Perla, MD;  Location: [facility] ENDOSCOPY;  Service: Cardiovascular;  Laterality: N/A;    Current Outpatient Medications  Medication Sig Dispense Refill  . Acetaminophen 500 MG coapsule Take 1 capsule by mouth every 6 (six) hours as needed for pain.     Marland Kitchen amiodarone (PACERONE) 200 MG tablet Take 1 tablet (200 mg total) by mouth daily. 90 tablet 3  . atorvastatin (LIPITOR) 80 MG tablet Take 80 mg by mouth daily at 6 PM.   0  . benazepril (LOTENSIN) 40 MG tablet TAKE 1 TABLET(40 MG) BY MOUTH DAILY 90 tablet 0  . ezetimibe (ZETIA) 10 MG tablet TAKE 1 TABLET(10 MG) BY MOUTH DAILY 30 tablet 3  . furosemide (LASIX) 20 MG tablet Take 2 tablets (40 mg total) by mouth daily. 60 tablet 2  . insulin NPH-regular Human (HUMULIN 70/30) (70-30) 100 UNIT/ML injection Inject 60 Units into the skin 2 (two) times daily with a meal. 30 mL 12  . INSULIN SYRINGE 1CC/29G 29G X 1/2" 1 ML MISC USE AS DIRECTED 100 each PRN  . isosorbide mononitrate (IMDUR) 30 MG 24 hr tablet TAKE 1 TABLET BY MOUTH EVERY DAY 30 tablet 3  . magnesium oxide (MAG-OX) 400 MG tablet Take 1 tablet (400 mg total) by mouth 2 (two) times daily. 180 tablet 3  . metFORMIN (GLUCOPHAGE) 500 MG tablet TAKE 1 TABLET(500 MG) BY MOUTH FOUR TIMES DAILY (Patient taking differently: TAKE 1 TABLET(500 MG) BY MOUTH TWO TIMES DAILY) 360 tablet 0  . methocarbamol (ROBAXIN) 500 MG tablet Take 1 tablet (500 mg total) by mouth every 6 (six) hours as needed for muscle spasms. 90 tablet 0  . metoprolol tartrate (LOPRESSOR) 25 MG tablet Take 1 tablet (25 mg) by mouth twice daily, you may also take an extra 25 mg daily if you have an episode of a-fib (Patient taking differently: Take 25 mg by mouth 2 (two) times daily. Take 1 tablet (25 mg) by mouth twice daily, you may also take an extra 25 mg daily if  you have an episode of a-fib)    . nitroGLYCERIN (NITROSTAT) 0.4 MG SL tablet Place 1 tablet (0.4 mg total) under the tongue every 5 (five) minutes as needed for chest pain. 25 tablet 6  . omeprazole (PRILOSEC) 20 MG capsule Take 1 capsule (20 mg total) by mouth 2 (two) times daily before a meal. (Patient taking differently: Take 20 mg by mouth daily. ) 180 capsule 3  . ONE TOUCH ULTRA TEST test strip TEST THREE TIMES A DAY AS DIRECTED. 100 each 12  . oxyCODONE (ROXICODONE) 5 MG immediate release tablet Take 1 tablet (5 mg total) by mouth every 4 (four) hours as needed for breakthrough pain. 30 tablet 0  . sildenafil (REVATIO) 20 MG tablet Take 1 tablet (20 mg total) by mouth 3 (three) times daily as needed. 90 tablet 3  . VIAGRA 100 MG tablet TAKE 1 TABLET BY MOUTH EVERY DAY AS NEEDED 10 tablet 0  . warfarin (COUMADIN) 5 MG tablet TAKE 1 TABLET BY MOUTH DAILY AS DIRECTED BY COUMADIN CLINIC (Patient taking differently: TAKE 5MG  ON MONDAY, WEDNESDAY, AND FRIDAY. TAKE 2.5 ON TUESDAY, THURSDAY, SATURDAY, AND SUNDAY) 30 tablet 0  No current facility-administered medications for this visit.      Allergies:   Patient has no known allergies.   Social History:  The patient  reports that he quit smoking about 9 years ago. His smoking use included cigarettes. He has a 52.50 pack-year smoking history. He has never used smokeless tobacco. He reports that he drinks alcohol. He reports that he does not use drugs.   Family History:   family history includes Cancer in his father; Diabetes in his son; Heart attack in his maternal uncle; Heart attack (age of onset: 38) in his father; Heart disease in his brother, mother, and paternal grandfather; Stroke in his mother.    Review of Systems: Review of Systems  Constitutional: Negative.        Leg weakness  Respiratory: Negative.   Cardiovascular: Negative.        Tachycardia  Gastrointestinal: Negative.   Musculoskeletal: Negative.   Neurological:  Negative.   Psychiatric/Behavioral: Negative.   All other systems reviewed and are negative.    PHYSICAL EXAM: VS:  BP (!) 158/62 (BP Location: Left Arm, Patient Position: Sitting, Cuff Size: Normal)   Pulse (!) 53   Ht 5\' 11"  (1.803 m)   Wt 247 lb 8 oz (112.3 kg)   BMI 34.52 kg/m  , BMI Body mass index is 34.52 kg/m. Constitutional:  oriented to person, place, and time. No distress. obese HENT:  Head: Normocephalic and atraumatic.  Eyes:  no discharge. No scleral icterus.  Neck: Normal range of motion. Neck supple. No JVD present.  Cardiovascular: Normal rate, regular rhythm, normal heart sounds and intact distal pulses. Exam reveals no gallop and no friction rub. No edema No murmur heard. Pulmonary/Chest: Effort normal and breath sounds normal. No stridor. No respiratory distress.  no wheezes.  no rales.  no tenderness.  Abdominal: Soft.  no distension.  no tenderness.  Musculoskeletal: Normal range of motion.  no  tenderness or deformity.  Neurological:  normal muscle tone. Coordination normal. No atrophy Skin: Skin is warm and dry. No rash noted. not diaphoretic.  Psychiatric:  normal mood and affect. behavior is normal. Thought content normal.    Recent Labs: 10/26/2016: ALT 30; TSH 4.190 11/28/2016: Magnesium 1.7 08/17/2017: B Natriuretic Peptide 236.0; BUN 19; Creatinine, Ser 1.14; Hemoglobin 13.2; Platelets 247; Potassium 4.3; Sodium 137    Lipid Panel Lab Results  Component Value Date   CHOL 162 01/13/2015   HDL 47 01/12/2013   LDLCALC 49 01/12/2013   TRIG 62 01/13/2015      Wt Readings from Last 3 Encounters:  08/22/17 247 lb 8 oz (112.3 kg)  08/17/17 245 lb (111.1 kg)  07/19/17 246 lb (111.6 kg)     ASSESSMENT AND PLAN:  Paroxysmal atrial fibrillation (HCC) - Plan: EKG 12-Lead  compliant with his warfarin,  Continue amiodarone 100 mg daily Continue metoprolol 25 twice a day  Leg weakness Etiology unclear, possibly from deconditioning Unable to  exclude muscle weakness from his statin He will hold the Lipitor for several weeks to see if this helps his symptoms  Coronary artery disease involving native coronary artery of native heart without angina pectoris Current he would no anginal symptoms, no further ischemic workup at this time  Essential hypertension Blood pressure elevated, previously well controlled at home even one month ago Recommended he monitor blood pressure at home and call our office with numbers No changes made to his medications  Mixed hyperlipidemia We'll hold Lipitor as above for leg weakness but restart  if no change in symptoms Stay on Zetia Recommended exercise, weight loss  Type 2 diabetes mellitus with other circulatory complication, with long-term current use of insulin ([place]) We have encouraged continued exercise, careful diet management in an effort to lose weight. Again discussed with him   Total encounter time more than 25 minutes  Greater than 50% was spent in counseling and coordination of care with the patient  Disposition:   F/U  12 months   Orders Placed This Encounter  Procedures  . EKG 12-Lead     Signed, Esmond Plants, M.D., Ph.D. 08/22/2017  [place], [place]

## 2017-08-22 ENCOUNTER — Encounter

## 2017-08-22 ENCOUNTER — Ambulatory Visit (INDEPENDENT_AMBULATORY_CARE_PROVIDER_SITE_OTHER): Payer: 59 | Admitting: Cardiovascular Disease

## 2017-08-22 ENCOUNTER — Encounter: Payer: Self-pay | Admitting: Cardiovascular Disease

## 2017-08-22 VITALS — BP 158/62 | HR 53 | Ht 71.0 in | Wt 247.5 lb

## 2017-08-22 DIAGNOSIS — I480 Paroxysmal atrial fibrillation: Secondary | ICD-10-CM | POA: Diagnosis not present

## 2017-08-22 DIAGNOSIS — I4892 Unspecified atrial flutter: Secondary | ICD-10-CM | POA: Diagnosis not present

## 2017-08-22 DIAGNOSIS — I10 Essential (primary) hypertension: Secondary | ICD-10-CM | POA: Diagnosis not present

## 2017-08-22 DIAGNOSIS — I25118 Atherosclerotic heart disease of native coronary artery with other forms of angina pectoris: Secondary | ICD-10-CM | POA: Diagnosis not present

## 2017-08-22 DIAGNOSIS — E1165 Type 2 diabetes mellitus with hyperglycemia: Secondary | ICD-10-CM | POA: Diagnosis not present

## 2017-08-22 DIAGNOSIS — E782 Mixed hyperlipidemia: Secondary | ICD-10-CM | POA: Diagnosis not present

## 2017-08-22 MED ORDER — ATORVASTATIN CALCIUM 80 MG PO TABS: 80.0000 mg | ORAL_TABLET | Freq: Every day | ORAL | 3 refills | Status: DC

## 2017-08-22 MED ORDER — BENAZEPRIL HCL 40 MG PO TABS: 40.0000 mg | ORAL_TABLET | Freq: Every day | ORAL | 3 refills | Status: DC

## 2017-08-22 MED ORDER — FUROSEMIDE 20 MG PO TABS: 20.0000 mg | ORAL_TABLET | Freq: Two times a day (BID) | ORAL | 6 refills | Status: DC | PRN

## 2017-08-22 NOTE — Patient Instructions (Addendum)
Medication Instructions:   No medication changes made  You can do a trial hold on the lipitor for a few weeks to see if leg weakness gets better  If legs get better call the office   Labwork:  No new labs needed  Testing/Procedures:  No further testing at this time   Follow-Up: It was a pleasure seeing you in the office today. Please call us if you have new issues that need to be addressed before your next appt.  [phone]  Your physician wants you to follow-up in: 12 months.  You will receive a reminder letter in the mail two months in advance. If you don't receive a letter, please call our office to schedule the follow-up appointment.  If you need a refill on your cardiac medications before your next appointment, please call your pharmacy.  For educational health videos Log in to : [email or web address] Or : [email or web address], password : [email or web address]

## 2017-09-01 ENCOUNTER — Other Ambulatory Visit: Payer: Self-pay | Admitting: Cardiovascular Disease

## 2017-09-01 NOTE — Telephone Encounter (Signed)
Refill Request.  

## 2017-09-13 ENCOUNTER — Ambulatory Visit: Payer: 59 | Admitting: Cardiovascular Disease

## 2017-09-25 ENCOUNTER — Ambulatory Visit (INDEPENDENT_AMBULATORY_CARE_PROVIDER_SITE_OTHER): Payer: 59

## 2017-09-25 DIAGNOSIS — I480 Paroxysmal atrial fibrillation: Secondary | ICD-10-CM

## 2017-09-25 DIAGNOSIS — I4892 Unspecified atrial flutter: Secondary | ICD-10-CM

## 2017-09-25 DIAGNOSIS — Z5181 Encounter for therapeutic drug level monitoring: Secondary | ICD-10-CM

## 2017-09-25 LAB — POCT INR: INR: 2.9 (ref 2.0–3.0)

## 2017-09-25 NOTE — Patient Instructions (Signed)
Please have a serving of greens today and continue taking 1/2 tablet daily except 1 tablet on Mondays, Wednesdays, and Fridays.   Recheck in 6 weeks.

## 2017-10-03 ENCOUNTER — Other Ambulatory Visit: Payer: Self-pay | Admitting: *Deleted

## 2017-10-03 ENCOUNTER — Telehealth: Payer: Self-pay | Admitting: Cardiovascular Disease

## 2017-10-03 MED ORDER — FUROSEMIDE 20 MG PO TABS: 20.0000 mg | ORAL_TABLET | Freq: Two times a day (BID) | ORAL | 5 refills | Status: DC | PRN

## 2017-10-03 NOTE — Telephone Encounter (Signed)
Pt c/o medication issue:  1. Name of Medication: Furosamide   2. How are you currently taking this medication (dosage and times per day)? 20 mg twice a day   3. Are you having a reaction (difficulty breathing--STAT)? no  4. What is your medication issue? He was told to take 20 mg twice a day but he will run out of it sooner than expected  He is asking [place] to please send in refill to [facility] in [place]

## 2017-10-03 NOTE — Telephone Encounter (Signed)
Requested Prescriptions   Signed Prescriptions Disp Refills  . furosemide (LASIX) 20 MG tablet 60 tablet 5    Sig: Take 1 tablet (20 mg total) by mouth 2 (two) times daily as needed.    Authorizing Provider: Minna Merritts    Ordering User: Britt Bottom

## 2017-10-08 ENCOUNTER — Other Ambulatory Visit: Payer: Self-pay | Admitting: Cardiovascular Disease

## 2017-10-09 NOTE — Telephone Encounter (Signed)
Please review for refill, Thanks !  

## 2017-11-06 ENCOUNTER — Ambulatory Visit (INDEPENDENT_AMBULATORY_CARE_PROVIDER_SITE_OTHER): Payer: 59

## 2017-11-06 DIAGNOSIS — I480 Paroxysmal atrial fibrillation: Secondary | ICD-10-CM

## 2017-11-06 DIAGNOSIS — Z5181 Encounter for therapeutic drug level monitoring: Secondary | ICD-10-CM

## 2017-11-06 DIAGNOSIS — I4892 Unspecified atrial flutter: Secondary | ICD-10-CM | POA: Diagnosis not present

## 2017-11-06 LAB — POCT INR: INR: 3.4 — AB (ref 2.0–3.0)

## 2017-11-06 NOTE — Patient Instructions (Signed)
Please take 1/2 tablet today, have a serving of greens today, then continue taking 1/2 tablet daily except 1 tablet on Mondays, Wednesdays, and Fridays.   Recheck in 5 weeks

## 2017-11-14 ENCOUNTER — Other Ambulatory Visit: Payer: Self-pay

## 2017-11-14 NOTE — Telephone Encounter (Signed)
*  STAT* If patient is at the pharmacy, call can be transferred to refill team.   1. Which medications need to be refilled? (please list name of each medication and dose if known) LASIX  2. Which pharmacy/location (including street and city if local pharmacy) is medication to be sent to? [facility] [place]  3. Do they need a 30 day or 90 day supply? 30  PT STATES HE NEEDS THIS CHANGED TO 2 A DAY.

## 2017-11-16 NOTE — Telephone Encounter (Signed)
I called the patient to follow up on his RX request as his lasix was just refilled a month ago for # 60 tablets w/ 5 refills. He states the pharmacy had to call the insurance for some reason, but this got straightened out so he picked up his RX last night.

## 2017-12-06 ENCOUNTER — Other Ambulatory Visit: Payer: Self-pay | Admitting: Cardiovascular Disease

## 2017-12-11 ENCOUNTER — Ambulatory Visit (INDEPENDENT_AMBULATORY_CARE_PROVIDER_SITE_OTHER): Payer: 59

## 2017-12-11 DIAGNOSIS — Z5181 Encounter for therapeutic drug level monitoring: Secondary | ICD-10-CM | POA: Diagnosis not present

## 2017-12-11 DIAGNOSIS — I4892 Unspecified atrial flutter: Secondary | ICD-10-CM | POA: Diagnosis not present

## 2017-12-11 DIAGNOSIS — I480 Paroxysmal atrial fibrillation: Secondary | ICD-10-CM

## 2017-12-11 LAB — POCT INR: INR: 3.4 — AB (ref 2.0–3.0)

## 2017-12-11 NOTE — Patient Instructions (Signed)
Please skip coumadin today, then START NEW DOSAGE of 1/2 tablet daily except 1 tablet on Mondays and Fridays.   Recheck in 3 weeks.

## 2018-01-06 ENCOUNTER — Other Ambulatory Visit: Payer: Self-pay | Admitting: Cardiovascular Disease

## 2018-01-07 ENCOUNTER — Emergency Department
Admission: EM | Admit: 2018-01-07 | Discharge: 2018-01-07 | Disposition: A | Discharge status: Home / Self Care | Payer: 59 | Attending: Emergency Medicine | Admitting: Emergency Medicine

## 2018-01-07 ENCOUNTER — Encounter: Payer: Self-pay | Admitting: Emergency Medicine

## 2018-01-07 ENCOUNTER — Emergency Department: Payer: 59

## 2018-01-07 ENCOUNTER — Other Ambulatory Visit: Payer: Self-pay

## 2018-01-07 DIAGNOSIS — I700 Atherosclerosis of aorta: Secondary | ICD-10-CM | POA: Insufficient documentation

## 2018-01-07 DIAGNOSIS — I252 Old myocardial infarction: Secondary | ICD-10-CM | POA: Insufficient documentation

## 2018-01-07 DIAGNOSIS — E119 Type 2 diabetes mellitus without complications: Secondary | ICD-10-CM | POA: Insufficient documentation

## 2018-01-07 DIAGNOSIS — I10 Essential (primary) hypertension: Secondary | ICD-10-CM | POA: Insufficient documentation

## 2018-01-07 DIAGNOSIS — M545 Low back pain, unspecified: Secondary | ICD-10-CM

## 2018-01-07 DIAGNOSIS — I2510 Atherosclerotic heart disease of native coronary artery without angina pectoris: Secondary | ICD-10-CM | POA: Insufficient documentation

## 2018-01-07 DIAGNOSIS — Z79899 Other long term (current) drug therapy: Secondary | ICD-10-CM | POA: Diagnosis not present

## 2018-01-07 DIAGNOSIS — M47816 Spondylosis without myelopathy or radiculopathy, lumbar region: Secondary | ICD-10-CM

## 2018-01-07 DIAGNOSIS — Z87891 Personal history of nicotine dependence: Secondary | ICD-10-CM | POA: Diagnosis not present

## 2018-01-07 DIAGNOSIS — Z794 Long term (current) use of insulin: Secondary | ICD-10-CM | POA: Diagnosis not present

## 2018-01-07 DIAGNOSIS — Z7901 Long term (current) use of anticoagulants: Secondary | ICD-10-CM | POA: Insufficient documentation

## 2018-01-07 MED ORDER — ORPHENADRINE CITRATE 30 MG/ML IJ SOLN
60.0000 mg | Freq: Two times a day (BID) | INTRAMUSCULAR | Status: DC
  Administered 2018-01-07: 60 mg via INTRAMUSCULAR
  Filled 2018-01-07: qty 2

## 2018-01-07 MED ORDER — LIDOCAINE 5 % EX PTCH: 1.0000 | MEDICATED_PATCH | Freq: Two times a day (BID) | CUTANEOUS | 0 refills | Status: DC

## 2018-01-07 MED ORDER — BACLOFEN 10 MG PO TABS: 10.0000 mg | ORAL_TABLET | Freq: Every day | ORAL | 0 refills | Status: DC

## 2018-01-07 MED ORDER — LIDOCAINE 5 % EX PTCH
1.0000 | MEDICATED_PATCH | CUTANEOUS | Status: DC
  Administered 2018-01-07: 1 via TRANSDERMAL
  Filled 2018-01-07: qty 1

## 2018-01-07 NOTE — ED Notes (Signed)

## 2018-01-07 NOTE — ED Triage Notes (Signed)
Pt to ED via POV c/o lower back pain x 2 weeks. Pt denies known injury. Pt states that he was seen by ortho on Tuesday and was told that he was having muscle spasms and was given muscle relaxer but it has not helped. Pt is in NAD at this time.

## 2018-01-07 NOTE — ED Provider Notes (Signed)
[facility] Emergency Department Provider Note  ____________________________________________  Time seen: Approximately 11:07 AM  I have reviewed the triage vital signs and the nursing notes.   HISTORY  Chief Complaint Back Pain    HPI Douglas Hunter is a 62 y.o. male presents to emergency department for evaluation of low back pain worse on the right for 1 week.  Pain radiates into both hips occasionally.  Patient had a microdiscectomy in March with Dr. Cari Caraway.  He saw the PA on Tuesday Particia Lather and was told he was having muscle spasms.  He was given a prescription for Flexeril, which is not helping.  No recent trauma.  No bowel or bladder dysfunction or saddle anesthesias.  He has insulin controlled diabetes.  He has an appointment with Ortho on Tuesday for knee pain.  No fever, chills, nausea, vomiting, abdominal pain, urinary symptoms, weakness.  Past Medical History:  Diagnosis Date  . Atrial flutter ([place])   . Coronary artery disease 08/2011   a. NSTEMI 08/2011, LHC w/ severe disease of LCx with good collaterals, lesion was high-risk for intervention due to a steep angulation of the LCx coming from the left main coronary artery as well as heavy calcifications, discused at cath conference w/ consensus advising med Rx  . GERD (gastroesophageal reflux disease)   . Hyperlipidemia   . Hypertension   . Paroxysmal atrial fibrillation ([place])    a. s/p ablation 01/2013; b. prn amio/dilt; c. CHADS2VASc => 3 (HTN, DM, vascular disease); d. on Coumadin  . Poorly controlled diabetes mellitus ([place])   . RBBB   . Sleep apnea     Patient Active Problem List   Diagnosis Date Noted  . Obesity, unspecified 07/19/2017  . Paroxysmal atrial fibrillation (H[facility])   . Poorly controlled diabetes mellitus ([place])   . Special screening for malignant neoplasms, colon   . Polyp of sigmoid colon   . Benign neoplasm of ascending colon   . Encounter for therapeutic drug  monitoring 01/13/2016  . Erectile dysfunction 12/10/2015  . Gastroesophageal reflux disease without esophagitis 12/03/2015  . Snoring 01/23/2013  . Atrial flutter ([place]) 08/20/2012  . Chest pain 08/19/2011  . HTN (hypertension) 08/19/2011  . HLD (hyperlipidemia) 08/19/2011  . Coronary artery disease 08/03/2011    Past Surgical History:  Procedure Laterality Date  . ABLATION  01/29/2013   PVI and CTI by Dr Rayann Heman for atrial flutter and paroxysmal atrial fibrillation  . ATRIAL FIBRILLATION ABLATION N/A 01/29/2013   Procedure: ATRIAL FIBRILLATION ABLATION;  Surgeon: Coralyn Mark, MD;  Location: [facility] CATH LAB;  Service: Cardiovascular;  Laterality: N/A;  . CARDIAC CATHETERIZATION  08/03/2011  . COLONOSCOPY WITH PROPOFOL N/A 02/02/2016   Procedure: COLONOSCOPY WITH PROPOFOL;  Surgeon: Lucilla Lame, MD;  Location: [facility] ENDOSCOPY;  Service: Endoscopy;  Laterality: N/A;  . LUMBAR LAMINECTOMY/DECOMPRESSION MICRODISCECTOMY N/A 06/07/2017   Procedure: LUMBAR LAMINECTOMY/DECOMPRESSION MICRODISCECTOMY 2 LEVELS-L3-4,L4-5;  Surgeon: Meade Maw, MD;  Location: [facility] ORS;  Service: Neurosurgery;  Laterality: N/A;  . TEE WITHOUT CARDIOVERSION N/A 01/28/2013   Procedure: TRANSESOPHAGEAL ECHOCARDIOGRAM (TEE);  Surgeon: Lelon Perla, MD;  Location: [facility] ENDOSCOPY;  Service: Cardiovascular;  Laterality: N/A;    Prior to Admission medications   Medication Sig Start Date End Date Taking? Authorizing Provider  Acetaminophen 500 MG coapsule Take 1 capsule by mouth every 6 (six) hours as needed for pain.     [provider]  amiodarone (PACERONE) 200 MG tablet Take 1 tablet (200 mg total) by mouth daily. 07/06/17  Minna Merritts, MD  atorvastatin (LIPITOR) 80 MG tablet Take 1 tablet (80 mg total) by mouth daily at 6 PM. 08/22/17   Gollan, Kathlene November, MD  baclofen (LIORESAL) 10 MG tablet Take 1 tablet (10 mg total) by mouth daily. 01/07/18 01/07/19  Laban Emperor, PA-C  benazepril (LOTENSIN) 40 MG  tablet Take 1 tablet (40 mg total) by mouth daily. 08/22/17   Minna Merritts, MD  ezetimibe (ZETIA) 10 MG tablet TAKE 1 TABLET(10 MG) BY MOUTH DAILY 12/07/17   Minna Merritts, MD  furosemide (LASIX) 20 MG tablet Take 1 tablet (20 mg total) by mouth 2 (two) times daily as needed. 10/03/17 01/01/18  Minna Merritts, MD  insulin NPH-regular Human (HUMULIN 70/30) (70-30) 100 UNIT/ML injection Inject 60 Units into the skin 2 (two) times daily with a meal. 01/13/15   Kathrine Haddock, NP  INSULIN SYRINGE 1CC/29G 29G X 1/2" 1 ML MISC USE AS DIRECTED 05/27/15   Kathrine Haddock, NP  isosorbide mononitrate (IMDUR) 30 MG 24 hr tablet TAKE 1 TABLET BY MOUTH EVERY DAY 08/14/17   Rise Mu, PA-C  lidocaine (LIDODERM) 5 % Place 1 patch onto the skin every 12 (twelve) hours. Remove & Discard patch within 12 hours or as directed by MD 01/07/18 01/07/19  Laban Emperor, PA-C  magnesium oxide (MAG-OX) 400 MG tablet Take 1 tablet (400 mg total) by mouth 2 (two) times daily. 11/29/16   Dunn, Areta Haber, PA-C  metFORMIN (GLUCOPHAGE) 500 MG tablet TAKE 1 TABLET(500 MG) BY MOUTH FOUR TIMES DAILY Patient taking differently: TAKE 1 TABLET(500 MG) BY MOUTH TWO TIMES DAILY 07/27/16   Kathrine Haddock, NP  methocarbamol (ROBAXIN) 500 MG tablet Take 1 tablet (500 mg total) by mouth every 6 (six) hours as needed for muscle spasms. 06/07/17   Marin Olp, PA-C  metoprolol tartrate (LOPRESSOR) 25 MG tablet Take 1 tablet (25 mg) by mouth twice daily, you may also take an extra 25 mg daily if you have an episode of a-fib Patient taking differently: Take 25 mg by mouth 2 (two) times daily. Take 1 tablet (25 mg) by mouth twice daily, you may also take an extra 25 mg daily if you have an episode of a-fib 12/19/16   Deboraha Sprang, MD  nitroGLYCERIN (NITROSTAT) 0.4 MG SL tablet Place 1 tablet (0.4 mg total) under the tongue every 5 (five) minutes as needed for chest pain. 03/15/16   Minna Merritts, MD  omeprazole (PRILOSEC) 20 MG capsule Take 1  capsule (20 mg total) by mouth 2 (two) times daily before a meal. Patient taking differently: Take 20 mg by mouth daily.  01/13/15   Kathrine Haddock, NP  ONE TOUCH ULTRA TEST test strip TEST THREE TIMES A DAY AS DIRECTED. 02/04/15   Kathrine Haddock, NP  oxyCODONE (ROXICODONE) 5 MG immediate release tablet Take 1 tablet (5 mg total) by mouth every 4 (four) hours as needed for breakthrough pain. 06/07/17   Marin Olp, PA-C  sildenafil (REVATIO) 20 MG tablet Take 1 tablet (20 mg total) by mouth 3 (three) times daily as needed. 03/09/17   Minna Merritts, MD  VIAGRA 100 MG tablet TAKE 1 TABLET BY MOUTH EVERY DAY AS NEEDED 10/28/15   Minna Merritts, MD  warfarin (COUMADIN) 5 MG tablet TAKE 1 TABLET BY MOUTH DAILY AS DIRECTED BY COUMADIN CLINIC 10/09/17   Minna Merritts, MD    Allergies Patient has no known allergies.  Family History  Problem Relation Age of Onset  .  Heart attack Maternal Uncle   . Heart attack Father 29  . Cancer Father        throat  . Stroke Mother   . Heart disease Mother        pacemaker  . Heart disease Brother        heart murmur  . Diabetes Son   . Heart disease Paternal Grandfather        MI    Social History Social History   Tobacco Use  . Smoking status: Former Smoker    Packs/day: 1.50    Years: 35.00    Pack years: 52.50    Types: Cigarettes    Last attempt to quit: 01/16/2008    Years since quitting: 9.9  . Smokeless tobacco: Never Used  Substance Use Topics  . Alcohol use: Yes    Comment: occasional  . Drug use: No     Review of Systems  Constitutional: No fever/chills Cardiovascular: No chest pain. Respiratory: No SOB. Gastrointestinal: No abdominal pain.  No nausea, no vomiting.  Musculoskeletal: Positive for low back pain. Skin: Negative for rash, abrasions, lacerations, ecchymosis. Neurological: Negative for headaches, numbness or tingling   ____________________________________________   PHYSICAL EXAM:  VITAL SIGNS: ED  Triage Vitals  Enc Vitals Group     BP 01/07/18 1025 (!) 118/59     Pulse Rate 01/07/18 1025 (!) 47     Resp 01/07/18 1025 16     Temp 01/07/18 1025 98 F (36.7 C)     Temp Source 01/07/18 1025 Oral     SpO2 01/07/18 1025 96 %     Weight 01/07/18 1026 254 lb (115.2 kg)     Height 01/07/18 1026 5\' 11"  (1.803 m)     Head Circumference --      Peak Flow --      Pain Score 01/07/18 1026 7     Pain Loc --      Pain Edu? --      Excl. in [place]? --      Constitutional: Alert and oriented. Well appearing and in no acute distress. Eyes: Conjunctivae are normal. PERRL. EOMI. Head: Atraumatic. ENT:      Ears:      Nose: No congestion/rhinnorhea.      Mouth/Throat: Mucous membranes are moist.  Neck: No stridor.  Cardiovascular: Normal rate, regular rhythm.  Good peripheral circulation. Respiratory: Normal respiratory effort without tachypnea or retractions. Lungs CTAB. Good air entry to the bases with no decreased or absent breath sounds. Musculoskeletal: Full range of motion to all extremities. No gross deformities appreciated.  Tenderness to palpation to inferior spine and right lumbar paraspinal muscles.  Strength and sensation equal in lower extremities bilaterally. Neurologic:  Normal speech and language. No gross focal neurologic deficits are appreciated.  Skin:  Skin is warm, dry and intact. No rash noted. Psychiatric: Mood and affect are normal. Speech and behavior are normal. Patient exhibits appropriate insight and judgement.   ____________________________________________   LABS (all labs ordered are listed, but only abnormal results are displayed)  Labs Reviewed - No data to display ____________________________________________  EKG  SB ____________________________________________  RADIOLOGY I, Laban Emperor, personally viewed and evaluated these images (plain radiographs) as part of my medical decision making, as well as reviewing the written report by the  radiologist.  Dg Lumbar Spine Complete  Result Date: 01/07/2018 CLINICAL DATA:  Low back pain radiating into the right hip for 2 weeks. EXAM: LUMBAR SPINE - COMPLETE 4+ VIEW COMPARISON:  MRI lumbar spine 02/09/2017. FINDINGS: Vertebral body height is maintained. Trace retrolisthesis L3 on L4 is chronic. Intervertebral disc space height is normal. Facet degenerative disease is seen in the lower lumbar spine. Aortic atherosclerosis noted. IMPRESSION: No acute abnormality. Lower lumbar facet degenerative disease noted. Atherosclerosis. Electronically Signed   By: Inge Rise M.D.   On: 01/07/2018 11:49    ____________________________________________    PROCEDURES  Procedure(s) performed:    Procedures    Medications  orphenadrine (NORFLEX) injection 60 mg (60 mg Intramuscular Given 01/07/18 1115)  lidocaine (LIDODERM) 5 % 1 patch (1 patch Transdermal Patch Applied 01/07/18 1113)     ____________________________________________   INITIAL IMPRESSION / ASSESSMENT AND PLAN / ED COURSE  Pertinent labs & imaging results that were available during my care of the patient were reviewed by me and considered in my medical decision making (see chart for details).  Review of the [place] CSRS was performed in accordance of the [place] prior to dispensing any controlled drugs.    Patient presented to emergency department for evaluation of low back pain for 1 week.  Vital signs and exam are reassuring.  Lumbar x-ray consistent with chronic changes.  Norflex and Lidoderm patch were given.  EKG shows sinus bradycardia and is consistent with previous.  Patient was told not to take NSAIDs from his cardiologist.  He has insulin controlled diabetes and has a blood sugar of 103 on his meter in the emergency department but has spiked as high as 350 in the last couple of days.  He is agreeable to hold off on steroids at this time due to blood sugar.  Patient has an appointment with orthopedics on Tuesday for knee  pain.  His muscle relaxer will be changed.  He will use a heating pad.  Patient will be discharged home with prescriptions for Lidoderm and baclofen. Patient is to follow up with orthopedics, primary care, neuro surgery as directed. Patient is given ED precautions to return to the ED for any worsening or new symptoms.     ____________________________________________  FINAL CLINICAL IMPRESSION(S) / ED DIAGNOSES  Final diagnoses:  Acute right-sided low back pain without sciatica  Osteoarthritis of lumbar spine, unspecified spinal osteoarthritis complication status  Aortic atherosclerosis (HCC)      NEW MEDICATIONS STARTED DURING THIS VISIT:  ED Discharge Orders         Ordered    lidocaine (LIDODERM) 5 %  Every 12 hours     01/07/18 1229    baclofen (LIORESAL) 10 MG tablet  Daily     01/07/18 1229              This chart was dictated using voice recognition software/Dragon. Despite best efforts to proofread, errors can occur which can change the meaning. Any change was purely unintentional.    Laban Emperor, PA-C 01/07/18 1539    Delman Kitten, MD 01/07/18 [phone]

## 2018-01-07 NOTE — ED Provider Notes (Signed)
EKG reviewed and interpreted by me at 11 AM Heart rate approximately 50 Sinus bradycardia QRS 140 QTc 410 Right bundle branch block without evidence of ischemia.   Delman Kitten, MD 01/07/18 1058

## 2018-01-08 ENCOUNTER — Ambulatory Visit (INDEPENDENT_AMBULATORY_CARE_PROVIDER_SITE_OTHER): Payer: 59

## 2018-01-08 DIAGNOSIS — I480 Paroxysmal atrial fibrillation: Secondary | ICD-10-CM

## 2018-01-08 DIAGNOSIS — I4892 Unspecified atrial flutter: Secondary | ICD-10-CM

## 2018-01-08 DIAGNOSIS — Z5181 Encounter for therapeutic drug level monitoring: Secondary | ICD-10-CM

## 2018-01-08 LAB — POCT INR: INR: 5.3 — AB (ref 2.0–3.0)

## 2018-01-08 NOTE — Patient Instructions (Addendum)
Please skip coumadin today & tomorrow, then START NEW DOSAGE of 1/2 tablet every day. Have a serving of greens today and try to be more consistent w/ them. Recheck in 3 weeks.

## 2018-01-08 NOTE — Telephone Encounter (Signed)
Refill Request.  

## 2018-01-11 ENCOUNTER — Encounter: Payer: Self-pay | Admitting: Internal Medicine

## 2018-01-12 NOTE — Progress Notes (Signed)
[facility] Consultation      Assessment and Plan:  The patient is a 62 year old male with symptoms and signs of obstructive sleep apnea.  He also has a history of atrial fibrillation, diabetes mellitus, GERD, coronary artery disease  Obstructive sleep apnea. --Obstructive Sleep Apnea, AHI of 30 (Severe OSA) on Auto-CPAP with pressure settings of 5-20 cm H2O.  -Doing well with CPAP, reminded to keep mask on the whole night.   Insomnia. --Doing much better since starting CPAP.   Diabetes mellitus, atrial fibrillation, coronary artery disease, GERD. --Above conditions can be affected by OSA, therefore it is important to see if they improve after starting on PAP.  --BP mildly elevated today, following up with cardiology soon.   Former smoker. --Referred to lung cancer screening, but could not afford it.   Flu vaccine given today.    Date: 01/12/2018  MRN# [id number] Douglas Hunter [birth year]    Douglas Hunter is a 62 y.o. old male seen in consultation for chief complaint of:    Chief Complaint  Patient presents with  . Sleep Apnea    pt states cpap therapy going well. He states he has more energy on cpap.    HPI:  The patient is a 62 year old male with a history of OSA, insomnia.  At last visit he was doing much better though having trouble tolerating high pressures.  His insomnia resolved with use of CPAP, and he was doing better at a lower pressure setting of 5-15 instead of 7-15. Today he notes that he is doing well, he is more awake, other than his wife snoring. He is no longer snoring.  He is no longer having issues with falling asleep. He was tried on melatonin but did not help.   **CPAP download data 12/13/2017-01/11/2018>> raw data personally reviewed.  Usage greater than 4 hours is 22/30 days.  Average usage on days used is 5 hours 1 minutes.  Pressure ranges 5-15.  Pressure median is 7.2, 95th percentile pressure is 11, maximum pressure 12.   Residual AHI is 1.5.  Overall this shows good compliance with CPAP with excellent control of obstructive sleep apnea. **Personally Reviewed download data 30 days as of 07/18/17; avg uses greater than 4 hours a 16/30 days.  Average usage on days used is 4 hours 5 minutes.  Auto set 7-15.  Median pressure 8, 95th percentile pressure 10, maximum pressure 11.  Residual AHI is 1.  This shows less than ideal usage, with excellent control of sleep apnea on CPAP. **Reviewed 3 days download data as of 02/21/17, usage greater than 4 hours is 100%.  Average usage is 5 hours 56 minutes.  Current setting is auto 5-20.  Median pressure 7, 95th percentile pressure is 10, maximum is 12.  Residual AHI is 1.3.  Social Hx:   Social History   Tobacco Use  . Smoking status: Former Smoker    Packs/day: 1.50    Years: 35.00    Pack years: 52.50    Types: Cigarettes    Last attempt to quit: 01/16/2008    Years since quitting: 9.9  . Smokeless tobacco: Never Used  Substance Use Topics  . Alcohol use: Yes    Comment: occasional  . Drug use: No   Medication:    Current Outpatient Medications:  .  Acetaminophen 500 MG coapsule, Take 1 capsule by mouth every 6 (six) hours as needed for pain. , Disp: , Rfl:  .  amiodarone (PACERONE) 200 MG tablet,  Take 1 tablet (200 mg total) by mouth daily., Disp: 90 tablet, Rfl: 3 .  atorvastatin (LIPITOR) 80 MG tablet, Take 1 tablet (80 mg total) by mouth daily at 6 PM., Disp: 90 tablet, Rfl: 3 .  baclofen (LIORESAL) 10 MG tablet, Take 1 tablet (10 mg total) by mouth daily., Disp: 30 tablet, Rfl: 0 .  benazepril (LOTENSIN) 40 MG tablet, Take 1 tablet (40 mg total) by mouth daily., Disp: 90 tablet, Rfl: 3 .  ezetimibe (ZETIA) 10 MG tablet, TAKE 1 TABLET(10 MG) BY MOUTH DAILY, Disp: 30 tablet, Rfl: 5 .  furosemide (LASIX) 20 MG tablet, Take 1 tablet (20 mg total) by mouth 2 (two) times daily as needed., Disp: 60 tablet, Rfl: 5 .  insulin NPH-regular Human (HUMULIN 70/30) (70-30)  100 UNIT/ML injection, Inject 60 Units into the skin 2 (two) times daily with a meal., Disp: 30 mL, Rfl: 12 .  INSULIN SYRINGE 1CC/29G 29G X 1/2" 1 ML MISC, USE AS DIRECTED, Disp: 100 each, Rfl: PRN .  isosorbide mononitrate (IMDUR) 30 MG 24 hr tablet, TAKE 1 TABLET BY MOUTH EVERY DAY, Disp: 30 tablet, Rfl: 3 .  lidocaine (LIDODERM) 5 %, Place 1 patch onto the skin every 12 (twelve) hours. Remove & Discard patch within 12 hours or as directed by MD, Disp: 10 patch, Rfl: 0 .  magnesium oxide (MAG-OX) 400 MG tablet, Take 1 tablet (400 mg total) by mouth 2 (two) times daily., Disp: 180 tablet, Rfl: 3 .  metFORMIN (GLUCOPHAGE) 500 MG tablet, TAKE 1 TABLET(500 MG) BY MOUTH FOUR TIMES DAILY (Patient taking differently: TAKE 1 TABLET(500 MG) BY MOUTH TWO TIMES DAILY), Disp: 360 tablet, Rfl: 0 .  methocarbamol (ROBAXIN) 500 MG tablet, Take 1 tablet (500 mg total) by mouth every 6 (six) hours as needed for muscle spasms., Disp: 90 tablet, Rfl: 0 .  metoprolol tartrate (LOPRESSOR) 25 MG tablet, Take 1 tablet (25 mg) by mouth twice daily, you may also take an extra 25 mg daily if you have an episode of a-fib (Patient taking differently: Take 25 mg by mouth 2 (two) times daily. Take 1 tablet (25 mg) by mouth twice daily, you may also take an extra 25 mg daily if you have an episode of a-fib), Disp: , Rfl:  .  nitroGLYCERIN (NITROSTAT) 0.4 MG SL tablet, Place 1 tablet (0.4 mg total) under the tongue every 5 (five) minutes as needed for chest pain., Disp: 25 tablet, Rfl: 6 .  omeprazole (PRILOSEC) 20 MG capsule, Take 1 capsule (20 mg total) by mouth 2 (two) times daily before a meal. (Patient taking differently: Take 20 mg by mouth daily. ), Disp: 180 capsule, Rfl: 3 .  ONE TOUCH ULTRA TEST test strip, TEST THREE TIMES A DAY AS DIRECTED., Disp: 100 each, Rfl: 12 .  oxyCODONE (ROXICODONE) 5 MG immediate release tablet, Take 1 tablet (5 mg total) by mouth every 4 (four) hours as needed for breakthrough pain., Disp: 30  tablet, Rfl: 0 .  sildenafil (REVATIO) 20 MG tablet, Take 1 tablet (20 mg total) by mouth 3 (three) times daily as needed., Disp: 90 tablet, Rfl: 3 .  VIAGRA 100 MG tablet, TAKE 1 TABLET BY MOUTH EVERY DAY AS NEEDED, Disp: 10 tablet, Rfl: 0 .  warfarin (COUMADIN) 5 MG tablet, TAKE 1 TABLET BY MOUTH EVERY DAY AS DIRECTED BY COUMADIN CLINIC, Disp: 30 tablet, Rfl: 0   Allergies:  Patient has no known allergies.    Review of Systems:  Constitutional: Feels well. Cardiovascular:  Denies chest pain, exertional chest pain.  Pulmonary: Denies hemoptysis, pleuritic chest pain.   The remainder of systems were reviewed and were found to be negative other than what is documented in the HPI.    Physical Examination:   VS: BP 122/66 (BP Location: Left Arm, Cuff Size: Large)   Pulse (!) 52   Ht 5\' 11"  (1.803 m)   Wt 249 lb (112.9 kg)   HC 16" (40.6 cm)   SpO2 96%   BMI 34.73 kg/m   General Appearance: No distress  Neuro:without focal findings, mental status, speech normal, alert and oriented HEENT: PERRLA, EOM intact Pulmonary: No wheezing, No rales  CardiovascularNormal S1,S2.  No m/r/g.  Abdomen: Benign, Soft, non-tender, No masses Renal:  No costovertebral tenderness  GU:  No performed at this time. Endoc: No evident thyromegaly, no signs of acromegaly or Cushing features Skin:   warm, no rashes, no ecchymosis  Extremities: normal, no cyanosis, clubbing.    LABORATORY PANEL:   CBC No results for input(s): WBC, HGB, HCT, PLT in the last 168 hours. ------------------------------------------------------------------------------------------------------------------  Chemistries  No results for input(s): NA, K, CL, CO2, GLUCOSE, BUN, CREATININE, CALCIUM, MG, AST, ALT, ALKPHOS, BILITOT in the last 168 hours.  Invalid input(s): GFRCGP ------------------------------------------------------------------------------------------------------------------  Cardiac Enzymes No results for  input(s): TROPONINI in the last 168 hours. ------------------------------------------------------------  RADIOLOGY:  No results found.     Thank  you for the consultation and for allowing [facility] to assist in the care of your patient. Our recommendations are noted above.  Please contact us if we can be of further service.   Marda Stalker, M.D., F.C.C.P.  Board Certified in Internal Medicine, Pulmonary Medicine, [place], and Sleep Medicine.  [facility] Office Number: [phone]   01/12/2018

## 2018-01-15 ENCOUNTER — Encounter: Payer: Self-pay | Admitting: Internal Medicine

## 2018-01-15 ENCOUNTER — Ambulatory Visit (INDEPENDENT_AMBULATORY_CARE_PROVIDER_SITE_OTHER): Payer: 59 | Admitting: Internal Medicine

## 2018-01-15 VITALS — BP 122/66 | HR 52 | Ht 71.0 in | Wt 249.0 lb

## 2018-01-15 DIAGNOSIS — G4733 Obstructive sleep apnea (adult) (pediatric): Secondary | ICD-10-CM | POA: Diagnosis not present

## 2018-01-15 DIAGNOSIS — Z23 Encounter for immunization: Secondary | ICD-10-CM

## 2018-01-15 NOTE — Patient Instructions (Signed)
Continue to use CPAP every night, keep your cpap on the the whole night.

## 2018-01-29 ENCOUNTER — Ambulatory Visit (INDEPENDENT_AMBULATORY_CARE_PROVIDER_SITE_OTHER): Payer: 59

## 2018-01-29 DIAGNOSIS — I4892 Unspecified atrial flutter: Secondary | ICD-10-CM

## 2018-01-29 DIAGNOSIS — Z5181 Encounter for therapeutic drug level monitoring: Secondary | ICD-10-CM | POA: Diagnosis not present

## 2018-01-29 DIAGNOSIS — I480 Paroxysmal atrial fibrillation: Secondary | ICD-10-CM

## 2018-01-29 LAB — POCT INR: INR: 2 (ref 2.0–3.0)

## 2018-01-29 NOTE — Patient Instructions (Signed)
Please continue dosage of 1/2 tablet every day. Recheck in 4 weeks.  

## 2018-02-20 ENCOUNTER — Other Ambulatory Visit: Payer: Self-pay | Admitting: Cardiovascular Disease

## 2018-02-20 ENCOUNTER — Telehealth: Payer: Self-pay

## 2018-02-20 MED ORDER — ISOSORBIDE MONONITRATE ER 30 MG PO TB24: 30.0000 mg | ORAL_TABLET | Freq: Every day | ORAL | 2 refills | Status: DC

## 2018-02-20 NOTE — Telephone Encounter (Signed)
Refill  Of Isosorb Mona Tab 30 mg 90 day supply sent to pt pharmacy [facility], left message for pt that this has been completed.

## 2018-02-21 NOTE — Telephone Encounter (Signed)
Refill Request.  

## 2018-02-26 ENCOUNTER — Ambulatory Visit (INDEPENDENT_AMBULATORY_CARE_PROVIDER_SITE_OTHER): Payer: 59

## 2018-02-26 DIAGNOSIS — Z5181 Encounter for therapeutic drug level monitoring: Secondary | ICD-10-CM | POA: Diagnosis not present

## 2018-02-26 DIAGNOSIS — I480 Paroxysmal atrial fibrillation: Secondary | ICD-10-CM

## 2018-02-26 DIAGNOSIS — I4892 Unspecified atrial flutter: Secondary | ICD-10-CM | POA: Diagnosis not present

## 2018-02-26 LAB — POCT INR: INR: 1.9 — AB (ref 2.0–3.0)

## 2018-02-26 NOTE — Patient Instructions (Signed)
Please take whole tablet tonight, then continue dosage of 1/2 tablet every day. Recheck in 4 weeks

## 2018-03-02 ENCOUNTER — Other Ambulatory Visit: Payer: Self-pay | Admitting: Internal Medicine

## 2018-03-08 ENCOUNTER — Other Ambulatory Visit: Payer: Self-pay | Admitting: Neurology

## 2018-03-08 DIAGNOSIS — R29898 Other symptoms and signs involving the musculoskeletal system: Secondary | ICD-10-CM

## 2018-03-19 ENCOUNTER — Other Ambulatory Visit: Payer: Self-pay | Admitting: Cardiovascular Disease

## 2018-03-20 NOTE — Telephone Encounter (Signed)
Refill Request.  

## 2018-03-23 ENCOUNTER — Ambulatory Visit
Admission: RE | Admit: 2018-03-23 | Discharge: 2018-03-23 | Disposition: A | Discharge status: Home / Self Care | Payer: 59 | Source: Ambulatory Visit | Attending: Neurology | Admitting: Neurology

## 2018-03-23 DIAGNOSIS — R531 Weakness: Secondary | ICD-10-CM | POA: Diagnosis present

## 2018-03-23 DIAGNOSIS — R29898 Other symptoms and signs involving the musculoskeletal system: Secondary | ICD-10-CM

## 2018-03-23 DIAGNOSIS — M50223 Other cervical disc displacement at C6-C7 level: Secondary | ICD-10-CM | POA: Diagnosis not present

## 2018-03-23 DIAGNOSIS — M2578 Osteophyte, vertebrae: Secondary | ICD-10-CM | POA: Diagnosis not present

## 2018-03-23 DIAGNOSIS — M4802 Spinal stenosis, cervical region: Secondary | ICD-10-CM | POA: Diagnosis not present

## 2018-03-27 ENCOUNTER — Telehealth: Payer: Self-pay | Admitting: Cardiovascular Disease

## 2018-03-27 NOTE — Telephone Encounter (Signed)
° °  [facility] Pre-operative Risk Assessment    Request for surgical clearance:  1. What type of surgery is being performed? C5-7 anterior cervical discectomy and fusion    2. When is this surgery scheduled? 04/18/2018   3. What type of clearance is required (medical clearance vs. Pharmacy clearance to hold med vs. Both)? both  4. Are there any medications that need to be held prior to surgery and how long? Stopping and restarting anticoagulant / blood thinners  5. Practice name and name of physician performing surgery? [facility] / [facility]   6. What is your office phone number [phone]    7.   What is your office fax number [phone]  8.   Anesthesia type (None, local, MAC, general) ?    Ace Gins 03/27/2018, 8:49 AM  _________________________________________________________________   (provider comments below)

## 2018-03-27 NOTE — Telephone Encounter (Signed)
Patient last seen by Dr. Rockey Situ on 08/22/17. He has a history of a NSTEMI & atrial flutter (post ablation).  To Dr. Rockey Situ to review- I wasn't sure if you would want to see him prior to surgery since it's been > 6 months.  Please adivse.   Surgery date 04/18/18.

## 2018-03-30 NOTE — Telephone Encounter (Signed)
Reviewed with patient that provider would like to see him here on Tuesday to review clearance for his upcoming procedure. He verbalized understanding and states that appointment time would be fine with him. Scheduling assisting with this and patient confirmed date and time.

## 2018-03-30 NOTE — Telephone Encounter (Signed)
We probably need to see him before surgery to check his heart rhythm and given prior history of diastolic CHF/shortness of breath Perhaps he can come in December 31 at 11:20 AM

## 2018-03-30 NOTE — Telephone Encounter (Signed)
Left voicemail message to call back  

## 2018-03-30 NOTE — Telephone Encounter (Signed)
Patient returned call Scheduled for 12/31

## 2018-04-02 ENCOUNTER — Ambulatory Visit (INDEPENDENT_AMBULATORY_CARE_PROVIDER_SITE_OTHER): Payer: 59

## 2018-04-02 DIAGNOSIS — I480 Paroxysmal atrial fibrillation: Secondary | ICD-10-CM | POA: Diagnosis not present

## 2018-04-02 DIAGNOSIS — Z5181 Encounter for therapeutic drug level monitoring: Secondary | ICD-10-CM | POA: Diagnosis not present

## 2018-04-02 DIAGNOSIS — I4892 Unspecified atrial flutter: Secondary | ICD-10-CM

## 2018-04-02 LAB — POCT INR: INR: 1.8 — AB (ref 2.0–3.0)

## 2018-04-02 NOTE — Progress Notes (Signed)
Cardiology Office Note  Date:  04/03/2018   ID:  Douglas Hunter, Douglas Hunter 05-Aug-1955, MRN [id number]  PCP:  Sofie Hartigan, MD   Chief Complaint  Patient presents with  . Other    Clearance for back surgery 1/15- needs ok to hold coumadin. Meds reviewed verbally with patient.     HPI:  Douglas Hunter is a pleasant 62 -year-old gentleman with  CAD,  non-STEMI with admission to the hospital in late April 2012  cardiac catheterization showing severe mid left circumflex disease, subtotally occluded, with collaterals from the RCA to the LAD circumflex,  normal ejection fraction  hospital shortly later with chest pain.   started on medical management,  nitrates and ranexa for his symptoms.  sleep apnea but has not had a sleep study.  episodes of atrial flutter. He reports symptoms dating back into his 8s.  2 recent hospitalizations for atrial flutter, 08/11/2012 and recently on 01/11/2013. Both episodes required hospitalization, medications for cardioversion. He has had several episodes that broke on their own Who presents today for follow-up of his atrial fibrillation  Needs neck surgery in 2 weeks  Severe spinal stenosis   Recent blood pressure measurements When seen by surgery BP 146/63 With endocrine: 126/76 BP with PMD 150/84 He does not monitor blood pressure at home  Takes lasix 40 daily, denies any lower extremity edema Also on benazepril and metoprolol  Lab work reviewed HBA1c 9.0 Total chol 100, LDL 47 TSH 5.9, up from 4.1 in 10/2016  Denies any shortness of breath or chest pain on exertion  No significant episodes ofatrial fib and flutter Asymptomatic bradycardia  EKG personally reviewed by myself on todays visit Shows sinus bradycardia rate 51 bpm right bundle branch block  Other past medical history reviewed Previous ablation with Dr. Rayann Heman,  for typical atrial flutter, atrial fibrillation.  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, works on his farm Home compliant with his anticoagulation No regular exercise program Denies any chest pain concerning for angina  EKG personally reviewed by myself on todays visit Shows normal sinus rhythm/sinus bradycardia rate 51 bpm right bundle branch block  Other past medical history Prior Echocardiogram shows normal LV systolic function, essentially normal study, May 2013  LDL 81, hemoglobin A1c 8.1, total cholesterol 144, HDL 50   PMH:   has a past medical history of Atrial flutter ([place]), Coronary artery disease (08/2011), GERD (gastroesophageal reflux disease), Hyperlipidemia, Hypertension, Paroxysmal atrial fibrillation ([place]), Poorly controlled diabetes mellitus ([place]), RBBB, and Sleep apnea.  PSH:    Past Surgical History:  Procedure Laterality Date  . ABLATION  01/29/2013   PVI and CTI by Dr Rayann Heman for atrial flutter and paroxysmal atrial fibrillation  . ATRIAL FIBRILLATION ABLATION N/A 01/29/2013   Procedure: ATRIAL FIBRILLATION ABLATION;  Surgeon: Coralyn Mark, MD;  Location: [facility];  Service: Cardiovascular;  Laterality: N/A;  . CARDIAC CATHETERIZATION  08/03/2011  . COLONOSCOPY WITH PROPOFOL N/A 02/02/2016   Procedure: COLONOSCOPY WITH PROPOFOL;  Surgeon: Lucilla Lame, MD;  Location: [facility] ENDOSCOPY;  Service: Endoscopy;  Laterality: N/A;  . LUMBAR LAMINECTOMY/DECOMPRESSION MICRODISCECTOMY N/A 06/07/2017   Procedure: LUMBAR LAMINECTOMY/DECOMPRESSION MICRODISCECTOMY 2 LEVELS-L3-4,L4-5;  Surgeon: Meade Maw, MD;  Location: [facility] ORS;  Service: Neurosurgery;  Laterality: N/A;  . TEE WITHOUT CARDIOVERSION N/A 01/28/2013   Procedure: TRANSESOPHAGEAL ECHOCARDIOGRAM (TEE);  Surgeon:  Lelon Perla, MD;  Location: [facility] ENDOSCOPY;  Service: Cardiovascular;  Laterality: N/A;    Current Outpatient Medications   Medication Sig Dispense Refill  . Acetaminophen 500 MG coapsule Take 1 capsule by mouth every 6 (six) hours as needed for pain.     Marland Kitchen amiodarone (PACERONE) 200 MG tablet Take 100 mg by mouth daily.    Marland Kitchen atorvastatin (LIPITOR) 80 MG tablet Take 80 mg by mouth daily.    . benazepril (LOTENSIN) 40 MG tablet Take 1 tablet (40 mg total) by mouth daily. 90 tablet 3  . ezetimibe (ZETIA) 10 MG tablet TAKE 1 TABLET(10 MG) BY MOUTH DAILY 30 tablet 5  . insulin degludec (TRESIBA) 100 UNIT/ML SOPN FlexTouch Pen Inject 45 Units into the skin daily.    . insulin lispro (HUMALOG) 100 UNIT/ML KiwkPen Inject 10 Units into the skin 3 (three) times daily.    . INSULIN SYRINGE 1CC/29G 29G X 1/2" 1 ML MISC USE AS DIRECTED 100 each PRN  . isosorbide mononitrate (IMDUR) 30 MG 24 hr tablet Take 1 tablet (30 mg total) by mouth daily. 90 tablet 2  . magnesium oxide (MAG-OX) 400 MG tablet Take 1 tablet (400 mg total) by mouth 2 (two) times daily. 180 tablet 3  . metFORMIN (GLUCOPHAGE) 500 MG tablet TAKE 1 TABLET(500 MG) BY MOUTH FOUR TIMES DAILY (Patient taking differently: TAKE 1 TABLET(500 MG) BY MOUTH TWO TIMES DAILY) 360 tablet 0  . metoprolol tartrate (LOPRESSOR) 25 MG tablet Take 1 tablet (25 mg) by mouth twice daily, you may also take an extra 25 mg daily if you have an episode of a-fib (Patient taking differently: Take 25 mg by mouth 2 (two) times daily. Take 1 tablet (25 mg) by mouth twice daily, you may also take an extra 25 mg daily if you have an episode of a-fib)    . nitroGLYCERIN (NITROSTAT) 0.4 MG SL tablet Place 1 tablet (0.4 mg total) under the tongue every 5 (five) minutes as needed for chest pain. 25 tablet 6  . omeprazole (PRILOSEC) 20 MG capsule Take 1 capsule (20 mg total) by mouth 2 (two) times daily before a meal. (Patient taking differently: Take 20 mg by mouth daily. ) 180 capsule 3  . ONE TOUCH ULTRA TEST test strip TEST THREE TIMES A DAY AS DIRECTED. 100 each 12  . sildenafil (REVATIO) 20 MG  tablet Take 1 tablet (20 mg total) by mouth 3 (three) times daily as needed. 90 tablet 3  . VIAGRA 100 MG tablet TAKE 1 TABLET BY MOUTH EVERY DAY AS NEEDED 10 tablet 0  . warfarin (COUMADIN) 5 MG tablet TAKE 1 TABLET BY MOUTH EVERY DAY AS DIRECTED BY COUMADIN CLINIC 30 tablet 0  . furosemide (LASIX) 20 MG tablet Take 2 tablets (40 mg total) by mouth daily. 60 tablet 5   No current facility-administered medications for this visit.      Allergies:   Patient has no known allergies.   Social History:  The patient  reports that he quit smoking about 10 years ago. His smoking use included cigarettes. He has a 52.50 pack-year smoking history. He has never used smokeless tobacco. He reports current alcohol use. He reports that he does not use drugs.   Family History:   family history includes Cancer in his father; Diabetes in his son; Heart attack in his maternal uncle; Heart attack (age of onset: 3) in his father; Heart disease in his brother, mother, and paternal grandfather; Stroke in his mother.  Review of Systems: Review of Systems  Constitutional: Negative.        Leg weakness  Respiratory: Negative.   Cardiovascular: Negative.   Gastrointestinal: Negative.   Musculoskeletal: Negative.   Neurological: Negative.   Psychiatric/Behavioral: Negative.   All other systems reviewed and are negative.    PHYSICAL EXAM: VS:  BP (!) 148/60 (BP Location: Left Arm, Patient Position: Sitting, Cuff Size: Large)   Pulse (!) 51   Ht 5\' 11"  (1.803 m)   Wt 251 lb 8 oz (114.1 kg)   BMI 35.08 kg/m  , BMI Body mass index is 35.08 kg/m. Constitutional:  oriented to person, place, and time. No distress.  Obese HENT:  Head: Grossly normal home Eyes:  no discharge. No scleral icterus.  Neck: No JVD, no carotid bruits  Cardiovascular: Regular rate and rhythm, no murmurs appreciated Pulmonary/Chest: Clear to auscultation bilaterally, no wheezes or rails Abdominal: Soft.  no distension.  no  tenderness.  Musculoskeletal: Normal range of motion Neurological:  normal muscle tone. Coordination normal. No atrophy Skin: Skin warm and dry Psychiatric: normal affect, pleasant  Recent Labs: 08/17/2017: B Natriuretic Peptide 236.0; BUN 19; Creatinine, Ser 1.14; Hemoglobin 13.2; Platelets 247; Potassium 4.3; Sodium 137    Lipid Panel Lab Results  Component Value Date   CHOL 162 01/13/2015   HDL 47 01/12/2013   LDLCALC 49 01/12/2013   TRIG 62 01/13/2015      Wt Readings from Last 3 Encounters:  04/03/18 251 lb 8 oz (114.1 kg)  01/15/18 249 lb (112.9 kg)  01/07/18 254 lb (115.2 kg)     ASSESSMENT AND PLAN:  Paroxysmal atrial fibrillation (HCC) - Plan: EKG 12-Lead  compliant with his warfarin, will continue low-dose amiodarone and metoprolol We will need to monitor TSH given climb from 4 up to 5.9 Asymptomatic bradycardia  Leg weakness Attributes this to spinal stenosis, cervical Scheduled to have surgery in 2 weeks Acceptable risk for surgery from cardiac perspective No further testing needed  Coronary artery disease involving native coronary artery of native heart without angina pectoris Currently with no symptoms of angina. No further workup at this time. Continue current medication regimen.  Stable  Essential hypertension Blood pressure seems to range from 120s up to 150 in office visits Does not check his blood pressure at home Does not have a blood pressure cuff No medication changes made  Mixed hyperlipidemia Cholesterol is at goal on the current lipid regimen. No changes to the medications were made.  Stable  Type 2 diabetes mellitus with other circulatory complication, with long-term current use of insulin (HCC) Hemoglobin A1c trending in the wrong direction up to 9 Recommended low carbohydrates   Total encounter time more than 25 minutes  Greater than 50% was spent in counseling and coordination of care with the patient  Disposition:   F/U  12  months   Orders Placed This Encounter  Procedures  . EKG 12-Lead     Signed, Esmond Plants, M.D., Ph.D. 04/03/2018  [place], [place]

## 2018-04-02 NOTE — Patient Instructions (Signed)
Please START NEW DOSAGE of 1/2 tablet every day EXCEPT 1 TABLET ON MONDAYS & FRIDAYS. You have an appt w/ Dr. Rockey Situ tomorrow- if he clears you to hold coumadin prior to your back surgery on 1/15, please follow his instructions on when to stop and resume taking.   Once resumed, please take extra 1/2 tablet for 2 days, then resume previous dosage.   Recheck 2 weeks after surgery - once you are able to leave your house.

## 2018-04-03 ENCOUNTER — Encounter: Payer: Self-pay | Admitting: Cardiovascular Disease

## 2018-04-03 ENCOUNTER — Ambulatory Visit (INDEPENDENT_AMBULATORY_CARE_PROVIDER_SITE_OTHER): Payer: 59 | Admitting: Cardiovascular Disease

## 2018-04-03 VITALS — BP 148/60 | HR 51 | Ht 71.0 in | Wt 251.5 lb

## 2018-04-03 DIAGNOSIS — E1165 Type 2 diabetes mellitus with hyperglycemia: Secondary | ICD-10-CM

## 2018-04-03 DIAGNOSIS — E782 Mixed hyperlipidemia: Secondary | ICD-10-CM

## 2018-04-03 DIAGNOSIS — I25118 Atherosclerotic heart disease of native coronary artery with other forms of angina pectoris: Secondary | ICD-10-CM

## 2018-04-03 DIAGNOSIS — I10 Essential (primary) hypertension: Secondary | ICD-10-CM

## 2018-04-03 DIAGNOSIS — I480 Paroxysmal atrial fibrillation: Secondary | ICD-10-CM | POA: Diagnosis not present

## 2018-04-03 DIAGNOSIS — I4892 Unspecified atrial flutter: Secondary | ICD-10-CM

## 2018-04-03 NOTE — Patient Instructions (Addendum)
Ok to stop warfarin before surgery, 7 days if surgery requires, Restart warfarin after surgery   Medication Instructions:  Consider holding the fluid pills on the weekends  If you need a refill on your cardiac medications before your next appointment, please call your pharmacy.    Lab work No new labs needed   If you have labs (blood work) drawn today and your tests are completely normal, you will receive your results only by: Marland Kitchen MyChart Message (if you have MyChart) OR . A paper copy in the mail If you have any lab test that is abnormal or we need to change your treatment, we will call you to review the results.   Testing/Procedures: No new testing needed   Follow-Up: At [facility], you and your health needs are our priority.  As part of our continuing mission to provide you with exceptional heart care, we have created designated Provider Care Teams.  These Care Teams include your primary Cardiologist (physician) and Advanced Practice Providers (APPs -  Physician Assistants and Nurse Practitioners) who all work together to provide you with the care you need, when you need it.  . You will need a follow up appointment in 12 months .   Please call our office 2 months in advance to schedule this appointment.    . Providers on your designated Care Team:   . Murray Hodgkins, NP . Christell Faith, PA-C . Marrianne Mood, PA-C  Any Other Special Instructions Will Be Listed Below (If Applicable).  For educational health videos Log in to : [email or web address] Or : [email or web address], password : [email or web address]

## 2018-04-11 ENCOUNTER — Encounter
Admission: RE | Admit: 2018-04-11 | Discharge: 2018-04-11 | Disposition: A | Discharge status: Home / Self Care | Payer: 59 | Source: Ambulatory Visit | Attending: Neurosurgery | Admitting: Neurosurgery

## 2018-04-11 ENCOUNTER — Other Ambulatory Visit: Payer: Self-pay

## 2018-04-11 DIAGNOSIS — Z01812 Encounter for preprocedural laboratory examination: Secondary | ICD-10-CM | POA: Diagnosis present

## 2018-04-11 HISTORY — DX: Acute myocardial infarction, unspecified: I21.9

## 2018-04-11 LAB — URINALYSIS, COMPLETE (UACMP) WITH MICROSCOPIC
BACTERIA UA: NONE SEEN
Bilirubin Urine: NEGATIVE
Glucose, UA: 50 mg/dL — AB
Hgb urine dipstick: NEGATIVE
Ketones, ur: NEGATIVE mg/dL
Leukocytes, UA: NEGATIVE
Nitrite: NEGATIVE
Protein, ur: NEGATIVE mg/dL
Specific Gravity, Urine: 1.011 (ref 1.005–1.030)
Squamous Epithelial / HPF: NONE SEEN (ref 0–5)
pH: 5 (ref 5.0–8.0)

## 2018-04-11 LAB — BASIC METABOLIC PANEL
Anion gap: 9 (ref 5–15)
BUN: 31 mg/dL — ABNORMAL HIGH (ref 8–23)
CO2: 20 mmol/L — ABNORMAL LOW (ref 22–32)
Calcium: 8.9 mg/dL (ref 8.9–10.3)
Chloride: 105 mmol/L (ref 98–111)
Creatinine, Ser: 1.24 mg/dL (ref 0.61–1.24)
GFR calc non Af Amer: >60 mL/min (ref >60)
Glucose, Bld: 266 mg/dL — ABNORMAL HIGH (ref 70–99)
Potassium: 4.6 mmol/L (ref 3.5–5.1)
Sodium: 134 mmol/L — ABNORMAL LOW (ref 135–145)

## 2018-04-11 LAB — APTT: aPTT: 41 seconds — ABNORMAL HIGH (ref 24–36)

## 2018-04-11 LAB — CBC
HCT: 38.8 % — ABNORMAL LOW (ref 39.0–52.0)
Hemoglobin: 13.1 g/dL (ref 13.0–17.0)
MCH: 32 pg (ref 26.0–34.0)
MCHC: 33.8 g/dL (ref 30.0–36.0)
MCV: 94.9 fL (ref 80.0–100.0)
Platelets: 249 10*3/uL (ref 150–400)
RBC: 4.09 MIL/uL — AB (ref 4.22–5.81)
RDW: 12.9 % (ref 11.5–15.5)
WBC: 9.2 10*3/uL (ref 4.0–10.5)
nRBC: 0 % (ref 0.0–0.2)

## 2018-04-11 LAB — TYPE AND SCREEN
ABO/RH(D): O POS
Antibody Screen: NEGATIVE

## 2018-04-11 LAB — SURGICAL PCR SCREEN
MRSA, PCR: NEGATIVE
Staphylococcus aureus: NEGATIVE

## 2018-04-11 LAB — PROTIME-INR
INR: 2.28
Prothrombin Time: 24.8 seconds — ABNORMAL HIGH (ref 11.4–15.2)

## 2018-04-11 NOTE — Pre-Procedure Instructions (Signed)
Cardiac clearance on chart. 

## 2018-04-11 NOTE — Patient Instructions (Addendum)
Your procedure is scheduled on: 04/18/18 Wed Report to Same Day Surgery 2nd floor medical mall [facility] Entrance-take elevator on left to 2nd floor.  Check in with surgery information desk.) To find out your arrival time please call [phone] between 1PM - 3PM on 1/14 Tues  Remember: Instructions that are not followed completely may result in serious medical risk, up to and including death, or upon the discretion of your surgeon and anesthesiologist your surgery may need to be rescheduled.    _x___ 1. Do not eat food after midnight the night before your procedure. You may drink clear liquids up to 2 hours before you are scheduled to arrive at the hospital for your procedure.  Do not drink clear liquids within 2 hours of your scheduled arrival to the hospital.  Clear liquids include  --Water or Apple juice without pulp  --Clear carbohydrate beverage such as ClearFast or Gatorade  --Black Coffee or Clear Tea (No milk, no creamers, do not add anything to                  the coffee or Tea Type 1 and type 2 diabetics should only drink water.   ____Ensure clear carbohydrate drink on the way to the hospital for bariatric patients  ____Ensure clear carbohydrate drink 3 hours before surgery for Dr Dwyane Luo patients if physician instructed.   No gum chewing or hard candies.     __x__ 2. No Alcohol for 24 hours before or after surgery.   __x__3. No Smoking or e-cigarettes for 24 prior to surgery.  Do not use any chewable tobacco products for at least 6 hour prior to surgery   ____  4. Bring all medications with you on the day of surgery if instructed.    __x__ 5. Notify your doctor if there is any change in your medical condition     (cold, fever, infections).    x___6. On the morning of surgery brush your teeth with toothpaste and water.  You may rinse your mouth with mouth wash if you wish.  Do not swallow any toothpaste or mouthwash.   Do not wear jewelry, make-up, hairpins, clips  or nail polish.  Do not wear lotions, powders, or perfumes. You may wear deodorant.  Do not shave 48 hours prior to surgery. Men may shave face and neck.  Do not bring valuables to the hospital.    [facility] is not responsible for any belongings or valuables.               Contacts, dentures or bridgework may not be worn into surgery.  Leave your suitcase in the car. After surgery it may be brought to your room.  For patients admitted to the hospital, discharge time is determined by your                       treatment team.  _  Patients discharged the day of surgery will not be allowed to drive home.  You will need someone to drive you home and stay with you the night of your procedure.    Please read over the following fact sheets that you were given:   [facility] Preparing for Surgery and or MRSA Information   _x___ Take anti-hypertensive listed below, cardiac, seizure, asthma,     anti-reflux and psychiatric medicines. These include:  1. amiodarone (PACERONE  2.isosorbide mononitrate (IMDUR) 30 MG 24 hr tablet  3.metoprolol tartrate (LOPRESSOR) 25 MG tablet  4.  5.  6.  ____Fleets enema or Magnesium Citrate as directed.   _x___ Use CHG Soap or sage wipes as directed on instruction sheet   ____ Use inhalers on the day of surgery and bring to hospital day of surgery  __x__ Stop Metformin and Janumet 2 days prior to surgery.    _x___ Take 1/2 of usual insulin dose the night before surgery and none on the morning     surgery.   _x___ Follow recommendations from Cardiologist, Pulmonologist or PCP regarding          stopping Aspirin, Coumadin, Plavix ,Eliquis, Effient, or Pradaxa, and Pletal. To stop Warfarin 1 week before surgery  X____Stop Anti-inflammatories such as Advil, Aleve, Ibuprofen, Motrin, Naproxen, Naprosyn, Goodies powders or aspirin products. OK to take Tylenol and                          Celebrex.   _x___ Stop supplements until after surgery.  But may continue  Vitamin D, Vitamin B,       and multivitamin.   _x___ Bring C-Pap to the hospital.

## 2018-04-12 NOTE — Pre-Procedure Instructions (Signed)
LABS FAXED TO DR Izora Ribas

## 2018-04-18 ENCOUNTER — Ambulatory Visit: Payer: No Typology Code available for payment source

## 2018-04-18 ENCOUNTER — Observation Stay
Admission: RE | Admit: 2018-04-18 | Discharge: 2018-04-19 | Disposition: A | Discharge status: Home / Self Care | Payer: No Typology Code available for payment source | Attending: Neurosurgery | Admitting: Neurosurgery

## 2018-04-18 ENCOUNTER — Ambulatory Visit: Payer: No Typology Code available for payment source | Admitting: Anesthesiology

## 2018-04-18 ENCOUNTER — Other Ambulatory Visit: Payer: Self-pay

## 2018-04-18 ENCOUNTER — Encounter: Payer: Self-pay | Admitting: Anesthesiology

## 2018-04-18 ENCOUNTER — Encounter
Admission: RE | Disposition: A | Discharge status: Home / Self Care | Payer: Self-pay | Source: Home / Self Care | Attending: Neurosurgery

## 2018-04-18 DIAGNOSIS — Z794 Long term (current) use of insulin: Secondary | ICD-10-CM | POA: Diagnosis not present

## 2018-04-18 DIAGNOSIS — Z7901 Long term (current) use of anticoagulants: Secondary | ICD-10-CM | POA: Diagnosis not present

## 2018-04-18 DIAGNOSIS — K219 Gastro-esophageal reflux disease without esophagitis: Secondary | ICD-10-CM | POA: Insufficient documentation

## 2018-04-18 DIAGNOSIS — Z6834 Body mass index (BMI) 34.0-34.9, adult: Secondary | ICD-10-CM | POA: Insufficient documentation

## 2018-04-18 DIAGNOSIS — M4802 Spinal stenosis, cervical region: Secondary | ICD-10-CM | POA: Diagnosis not present

## 2018-04-18 DIAGNOSIS — I10 Essential (primary) hypertension: Secondary | ICD-10-CM | POA: Insufficient documentation

## 2018-04-18 DIAGNOSIS — G4730 Sleep apnea, unspecified: Secondary | ICD-10-CM | POA: Diagnosis not present

## 2018-04-18 DIAGNOSIS — Z87891 Personal history of nicotine dependence: Secondary | ICD-10-CM | POA: Diagnosis not present

## 2018-04-18 DIAGNOSIS — G959 Disease of spinal cord, unspecified: Principal | ICD-10-CM | POA: Insufficient documentation

## 2018-04-18 DIAGNOSIS — I4891 Unspecified atrial fibrillation: Secondary | ICD-10-CM | POA: Diagnosis not present

## 2018-04-18 DIAGNOSIS — I252 Old myocardial infarction: Secondary | ICD-10-CM | POA: Insufficient documentation

## 2018-04-18 DIAGNOSIS — G992 Myelopathy in diseases classified elsewhere: Secondary | ICD-10-CM | POA: Insufficient documentation

## 2018-04-18 DIAGNOSIS — M5412 Radiculopathy, cervical region: Secondary | ICD-10-CM | POA: Diagnosis present

## 2018-04-18 DIAGNOSIS — E669 Obesity, unspecified: Secondary | ICD-10-CM | POA: Diagnosis not present

## 2018-04-18 DIAGNOSIS — Z79899 Other long term (current) drug therapy: Secondary | ICD-10-CM | POA: Diagnosis not present

## 2018-04-18 DIAGNOSIS — I2510 Atherosclerotic heart disease of native coronary artery without angina pectoris: Secondary | ICD-10-CM | POA: Diagnosis not present

## 2018-04-18 DIAGNOSIS — Z981 Arthrodesis status: Secondary | ICD-10-CM

## 2018-04-18 DIAGNOSIS — E119 Type 2 diabetes mellitus without complications: Secondary | ICD-10-CM | POA: Insufficient documentation

## 2018-04-18 DIAGNOSIS — Z419 Encounter for procedure for purposes other than remedying health state, unspecified: Secondary | ICD-10-CM

## 2018-04-18 HISTORY — PX: ANTERIOR CERVICAL DECOMP/DISCECTOMY FUSION: SHX1161

## 2018-04-18 LAB — GLUCOSE, CAPILLARY
GLUCOSE-CAPILLARY: 260 mg/dL — AB (ref 70–99)
Glucose-Capillary: 201 mg/dL — ABNORMAL HIGH (ref 70–99)
Glucose-Capillary: 287 mg/dL — ABNORMAL HIGH (ref 70–99)
Glucose-Capillary: 260 mg/dL — ABNORMAL HIGH (ref 70–99)
Glucose-Capillary: 274 mg/dL — ABNORMAL HIGH (ref 70–99)

## 2018-04-18 LAB — PROTIME-INR
INR: 0.93
Prothrombin Time: 12.4 seconds (ref 11.4–15.2)

## 2018-04-18 SURGERY — ANTERIOR CERVICAL DECOMPRESSION/DISCECTOMY FUSION 2 LEVELS
Anesthesia: General | Site: Neck

## 2018-04-18 MED ORDER — REMIFENTANIL HCL 1 MG IV SOLR
INTRAVENOUS | Status: AC
  Filled 2018-04-18: qty 1000

## 2018-04-18 MED ORDER — EPHEDRINE SULFATE 50 MG/ML IJ SOLN
INTRAMUSCULAR | Status: DC | PRN
  Administered 2018-04-18 (×4): 10 mg via INTRAVENOUS

## 2018-04-18 MED ORDER — PROPOFOL 500 MG/50ML IV EMUL
INTRAVENOUS | Status: AC
  Filled 2018-04-18: qty 50

## 2018-04-18 MED ORDER — FENTANYL CITRATE (PF) 100 MCG/2ML IJ SOLN
INTRAMUSCULAR | Status: AC
  Administered 2018-04-18: 25 ug via INTRAVENOUS
  Filled 2018-04-18: qty 2

## 2018-04-18 MED ORDER — ACETAMINOPHEN 650 MG RE SUPP: 650.0000 mg | RECTAL | Status: DC | PRN

## 2018-04-18 MED ORDER — BUPIVACAINE-EPINEPHRINE (PF) 0.5% -1:200000 IJ SOLN
INTRAMUSCULAR | Status: DC | PRN
  Administered 2018-04-18: 10 mL

## 2018-04-18 MED ORDER — FUROSEMIDE 40 MG PO TABS
40.0000 mg | ORAL_TABLET | ORAL | Status: DC
  Administered 2018-04-19: 40 mg via ORAL
  Filled 2018-04-18: qty 1

## 2018-04-18 MED ORDER — BUPIVACAINE-EPINEPHRINE (PF) 0.5% -1:200000 IJ SOLN
INTRAMUSCULAR | Status: AC
  Filled 2018-04-18: qty 30

## 2018-04-18 MED ORDER — HYDROMORPHONE HCL 1 MG/ML IJ SOLN: 0.5000 mg | INTRAMUSCULAR | Status: DC | PRN

## 2018-04-18 MED ORDER — LABETALOL HCL 5 MG/ML IV SOLN
5.0000 mg | INTRAVENOUS | Status: DC | PRN
  Administered 2018-04-18: 5 mg via INTRAVENOUS

## 2018-04-18 MED ORDER — INSULIN ASPART 100 UNIT/ML ~~LOC~~ SOLN
0.0000 [IU] | Freq: Three times a day (TID) | SUBCUTANEOUS | Status: DC
  Administered 2018-04-18: 8 [IU] via SUBCUTANEOUS
  Administered 2018-04-19: 11 [IU] via SUBCUTANEOUS
  Filled 2018-04-18 (×2): qty 1

## 2018-04-18 MED ORDER — METFORMIN HCL 500 MG PO TABS
500.0000 mg | ORAL_TABLET | Freq: Two times a day (BID) | ORAL | Status: DC
  Administered 2018-04-18 – 2018-04-19 (×2): 500 mg via ORAL
  Filled 2018-04-18 (×3): qty 1

## 2018-04-18 MED ORDER — LABETALOL HCL 5 MG/ML IV SOLN
INTRAVENOUS | Status: AC
  Filled 2018-04-18: qty 4

## 2018-04-18 MED ORDER — MIDAZOLAM HCL 2 MG/2ML IJ SOLN
INTRAMUSCULAR | Status: AC
  Filled 2018-04-18: qty 2

## 2018-04-18 MED ORDER — SODIUM CHLORIDE 0.9% FLUSH: 3.0000 mL | INTRAVENOUS | Status: DC | PRN

## 2018-04-18 MED ORDER — ONDANSETRON HCL 4 MG PO TABS: 4.0000 mg | ORAL_TABLET | Freq: Four times a day (QID) | ORAL | Status: DC | PRN

## 2018-04-18 MED ORDER — METHOCARBAMOL 1000 MG/10ML IJ SOLN
500.0000 mg | Freq: Four times a day (QID) | INTRAVENOUS | Status: DC | PRN
  Filled 2018-04-18: qty 5

## 2018-04-18 MED ORDER — FAMOTIDINE 20 MG PO TABS
20.0000 mg | ORAL_TABLET | Freq: Once | ORAL | Status: AC
  Administered 2018-04-18: 20 mg via ORAL

## 2018-04-18 MED ORDER — SODIUM CHLORIDE 0.9 % IV SOLN
INTRAVENOUS | Status: DC
  Administered 2018-04-18 – 2018-04-19 (×2): via INTRAVENOUS

## 2018-04-18 MED ORDER — ONDANSETRON HCL 4 MG/2ML IJ SOLN: 4.0000 mg | Freq: Four times a day (QID) | INTRAMUSCULAR | Status: DC | PRN

## 2018-04-18 MED ORDER — METOPROLOL TARTRATE 25 MG PO TABS
25.0000 mg | ORAL_TABLET | Freq: Two times a day (BID) | ORAL | Status: DC
  Administered 2018-04-18 – 2018-04-19 (×2): 25 mg via ORAL
  Filled 2018-04-18 (×2): qty 1

## 2018-04-18 MED ORDER — ISOSORBIDE MONONITRATE ER 30 MG PO TB24
30.0000 mg | ORAL_TABLET | Freq: Every day | ORAL | Status: DC
  Administered 2018-04-19: 30 mg via ORAL
  Filled 2018-04-18: qty 1

## 2018-04-18 MED ORDER — OXYCODONE HCL 5 MG PO TABS
10.0000 mg | ORAL_TABLET | ORAL | Status: DC | PRN
  Administered 2018-04-18: 10 mg via ORAL
  Filled 2018-04-18 (×2): qty 2

## 2018-04-18 MED ORDER — SODIUM CHLORIDE 0.9 % IV SOLN: 250.0000 mL | INTRAVENOUS | Status: DC

## 2018-04-18 MED ORDER — ONDANSETRON HCL 4 MG/2ML IJ SOLN
INTRAMUSCULAR | Status: DC | PRN
  Administered 2018-04-18: 4 mg via INTRAVENOUS

## 2018-04-18 MED ORDER — HYDROMORPHONE HCL 1 MG/ML IJ SOLN
0.5000 mg | INTRAMUSCULAR | Status: AC | PRN
  Administered 2018-04-18 (×4): 0.5 mg via INTRAVENOUS

## 2018-04-18 MED ORDER — LIDOCAINE HCL (PF) 2 % IJ SOLN
INTRAMUSCULAR | Status: AC
  Filled 2018-04-18: qty 10

## 2018-04-18 MED ORDER — SUCCINYLCHOLINE CHLORIDE 20 MG/ML IJ SOLN
INTRAMUSCULAR | Status: AC
  Filled 2018-04-18: qty 1

## 2018-04-18 MED ORDER — DEXAMETHASONE SODIUM PHOSPHATE 10 MG/ML IJ SOLN
INTRAMUSCULAR | Status: DC | PRN
  Administered 2018-04-18: 10 mg via INTRAVENOUS

## 2018-04-18 MED ORDER — MENTHOL 3 MG MT LOZG
1.0000 | LOZENGE | OROMUCOSAL | Status: DC | PRN
  Filled 2018-04-18: qty 9

## 2018-04-18 MED ORDER — ROCURONIUM BROMIDE 100 MG/10ML IV SOLN
INTRAVENOUS | Status: DC | PRN
  Administered 2018-04-18: 5 mg via INTRAVENOUS
  Administered 2018-04-18: 10 mg via INTRAVENOUS

## 2018-04-18 MED ORDER — METHOCARBAMOL 500 MG PO TABS
500.0000 mg | ORAL_TABLET | Freq: Four times a day (QID) | ORAL | Status: DC | PRN
  Administered 2018-04-18: 500 mg via ORAL
  Filled 2018-04-18 (×2): qty 1

## 2018-04-18 MED ORDER — PHENOL 1.4 % MT LIQD
1.0000 | OROMUCOSAL | Status: DC | PRN
  Filled 2018-04-18: qty 177

## 2018-04-18 MED ORDER — ROCURONIUM BROMIDE 50 MG/5ML IV SOLN
INTRAVENOUS | Status: AC
  Filled 2018-04-18: qty 1

## 2018-04-18 MED ORDER — SODIUM CHLORIDE 0.9 % IV SOLN
INTRAVENOUS | Status: DC | PRN
  Administered 2018-04-18: 25 ug/min via INTRAVENOUS

## 2018-04-18 MED ORDER — GLYCOPYRROLATE 0.2 MG/ML IJ SOLN
INTRAMUSCULAR | Status: AC
  Filled 2018-04-18: qty 1

## 2018-04-18 MED ORDER — DEXAMETHASONE SODIUM PHOSPHATE 10 MG/ML IJ SOLN
INTRAMUSCULAR | Status: AC
  Filled 2018-04-18: qty 1

## 2018-04-18 MED ORDER — SODIUM CHLORIDE FLUSH 0.9 % IV SOLN
INTRAVENOUS | Status: AC
  Filled 2018-04-18: qty 10

## 2018-04-18 MED ORDER — CEFAZOLIN SODIUM-DEXTROSE 2-4 GM/100ML-% IV SOLN
INTRAVENOUS | Status: AC
  Filled 2018-04-18: qty 100

## 2018-04-18 MED ORDER — THROMBIN 5000 UNITS EX SOLR
CUTANEOUS | Status: DC | PRN
  Administered 2018-04-18: 5000 [IU] via TOPICAL

## 2018-04-18 MED ORDER — PROPOFOL 10 MG/ML IV BOLUS
INTRAVENOUS | Status: DC | PRN
  Administered 2018-04-18: 200 mg via INTRAVENOUS

## 2018-04-18 MED ORDER — BENAZEPRIL HCL 20 MG PO TABS
40.0000 mg | ORAL_TABLET | Freq: Every day | ORAL | Status: DC
  Administered 2018-04-19: 40 mg via ORAL
  Filled 2018-04-18 (×2): qty 2

## 2018-04-18 MED ORDER — SODIUM CHLORIDE 0.9 % IV SOLN
INTRAVENOUS | Status: DC | PRN
  Administered 2018-04-18: .15 ug/kg/min via INTRAVENOUS
  Administered 2018-04-18: 12:00:00 via INTRAVENOUS

## 2018-04-18 MED ORDER — OXYCODONE HCL 5 MG PO TABS
5.0000 mg | ORAL_TABLET | ORAL | Status: DC | PRN
  Administered 2018-04-18: 5 mg via ORAL
  Filled 2018-04-18: qty 1

## 2018-04-18 MED ORDER — SODIUM CHLORIDE 0.9 % IR SOLN
Status: DC | PRN
  Administered 2018-04-18: 10000 mL

## 2018-04-18 MED ORDER — PHENYLEPHRINE HCL 10 MG/ML IJ SOLN
INTRAMUSCULAR | Status: AC
  Filled 2018-04-18: qty 1

## 2018-04-18 MED ORDER — INSULIN GLARGINE 100 UNIT/ML ~~LOC~~ SOLN
55.0000 [IU] | Freq: Every day | SUBCUTANEOUS | Status: DC
  Administered 2018-04-18: 55 [IU] via SUBCUTANEOUS
  Filled 2018-04-18 (×2): qty 0.55

## 2018-04-18 MED ORDER — FAMOTIDINE 20 MG PO TABS
ORAL_TABLET | ORAL | Status: AC
  Administered 2018-04-18: 20 mg via ORAL
  Filled 2018-04-18: qty 1

## 2018-04-18 MED ORDER — FENTANYL CITRATE (PF) 250 MCG/5ML IJ SOLN
INTRAMUSCULAR | Status: AC
  Filled 2018-04-18: qty 5

## 2018-04-18 MED ORDER — AMIODARONE HCL 100 MG PO TABS
100.0000 mg | ORAL_TABLET | Freq: Every day | ORAL | Status: DC
  Administered 2018-04-19: 100 mg via ORAL
  Filled 2018-04-18: qty 1

## 2018-04-18 MED ORDER — SUCCINYLCHOLINE CHLORIDE 20 MG/ML IJ SOLN
INTRAMUSCULAR | Status: DC | PRN
  Administered 2018-04-18: 120 mg via INTRAVENOUS

## 2018-04-18 MED ORDER — ONDANSETRON HCL 4 MG/2ML IJ SOLN: 4.0000 mg | Freq: Once | INTRAMUSCULAR | Status: DC | PRN

## 2018-04-18 MED ORDER — LACTATED RINGERS IV SOLN
INTRAVENOUS | Status: DC | PRN
  Administered 2018-04-18: 11:00:00 via INTRAVENOUS

## 2018-04-18 MED ORDER — EZETIMIBE 10 MG PO TABS
10.0000 mg | ORAL_TABLET | Freq: Every day | ORAL | Status: DC
  Administered 2018-04-18: 10 mg via ORAL
  Filled 2018-04-18 (×2): qty 1

## 2018-04-18 MED ORDER — SUGAMMADEX SODIUM 500 MG/5ML IV SOLN
INTRAVENOUS | Status: AC
  Filled 2018-04-18: qty 5

## 2018-04-18 MED ORDER — FENTANYL CITRATE (PF) 100 MCG/2ML IJ SOLN
INTRAMUSCULAR | Status: DC | PRN
  Administered 2018-04-18: 50 ug via INTRAVENOUS
  Administered 2018-04-18: 100 ug via INTRAVENOUS

## 2018-04-18 MED ORDER — INSULIN ASPART 100 UNIT/ML ~~LOC~~ SOLN
8.0000 [IU] | Freq: Once | SUBCUTANEOUS | Status: AC
  Administered 2018-04-18: 8 [IU] via SUBCUTANEOUS

## 2018-04-18 MED ORDER — PROPOFOL 10 MG/ML IV BOLUS
INTRAVENOUS | Status: AC
  Filled 2018-04-18: qty 20

## 2018-04-18 MED ORDER — SENNOSIDES-DOCUSATE SODIUM 8.6-50 MG PO TABS: 1.0000 | ORAL_TABLET | Freq: Every evening | ORAL | Status: DC | PRN

## 2018-04-18 MED ORDER — GLYCOPYRROLATE 0.2 MG/ML IJ SOLN
INTRAMUSCULAR | Status: DC | PRN
  Administered 2018-04-18: 0.2 mg via INTRAVENOUS

## 2018-04-18 MED ORDER — ATORVASTATIN CALCIUM 20 MG PO TABS
80.0000 mg | ORAL_TABLET | Freq: Every day | ORAL | Status: DC
  Administered 2018-04-18: 80 mg via ORAL
  Filled 2018-04-18: qty 4

## 2018-04-18 MED ORDER — INSULIN ASPART PROT & ASPART (70-30 MIX) 100 UNIT/ML ~~LOC~~ SUSP
8.0000 [IU] | Freq: Two times a day (BID) | SUBCUTANEOUS | Status: DC
  Administered 2018-04-18 – 2018-04-19 (×2): 8 [IU] via SUBCUTANEOUS
  Filled 2018-04-18 (×2): qty 10

## 2018-04-18 MED ORDER — NITROGLYCERIN 0.4 MG SL SUBL: 0.4000 mg | SUBLINGUAL_TABLET | SUBLINGUAL | Status: DC | PRN

## 2018-04-18 MED ORDER — MIDAZOLAM HCL 2 MG/2ML IJ SOLN
INTRAMUSCULAR | Status: DC | PRN
  Administered 2018-04-18: 2 mg via INTRAVENOUS

## 2018-04-18 MED ORDER — SODIUM CHLORIDE 0.9% FLUSH: 3.0000 mL | Freq: Two times a day (BID) | INTRAVENOUS | Status: DC

## 2018-04-18 MED ORDER — HYDROMORPHONE HCL 1 MG/ML IJ SOLN
INTRAMUSCULAR | Status: AC
  Administered 2018-04-18: 0.5 mg via INTRAVENOUS
  Filled 2018-04-18: qty 1

## 2018-04-18 MED ORDER — ONDANSETRON HCL 4 MG/2ML IJ SOLN
INTRAMUSCULAR | Status: AC
  Filled 2018-04-18: qty 2

## 2018-04-18 MED ORDER — INSULIN DEGLUDEC 100 UNIT/ML ~~LOC~~ SOPN: 55.0000 [IU] | PEN_INJECTOR | Freq: Every day | SUBCUTANEOUS | Status: DC

## 2018-04-18 MED ORDER — FENTANYL CITRATE (PF) 100 MCG/2ML IJ SOLN
25.0000 ug | INTRAMUSCULAR | Status: AC | PRN
  Administered 2018-04-18 (×6): 25 ug via INTRAVENOUS

## 2018-04-18 MED ORDER — ACETAMINOPHEN 500 MG PO TABS
1000.0000 mg | ORAL_TABLET | Freq: Four times a day (QID) | ORAL | Status: DC
  Administered 2018-04-18 – 2018-04-19 (×3): 1000 mg via ORAL
  Filled 2018-04-18 (×3): qty 2

## 2018-04-18 MED ORDER — ACETAMINOPHEN 325 MG PO TABS: 650.0000 mg | ORAL_TABLET | ORAL | Status: DC | PRN

## 2018-04-18 MED ORDER — SODIUM CHLORIDE 0.9 % IV SOLN
INTRAVENOUS | Status: DC
  Administered 2018-04-18 (×2): via INTRAVENOUS

## 2018-04-18 MED ORDER — INSULIN ASPART 100 UNIT/ML ~~LOC~~ SOLN
SUBCUTANEOUS | Status: AC
  Filled 2018-04-18: qty 1

## 2018-04-18 MED ORDER — THROMBIN 5000 UNITS EX SOLR
CUTANEOUS | Status: AC
  Filled 2018-04-18: qty 5000

## 2018-04-18 MED ORDER — PROPOFOL 500 MG/50ML IV EMUL
INTRAVENOUS | Status: DC | PRN
  Administered 2018-04-18: 100 ug/kg/min via INTRAVENOUS

## 2018-04-18 MED ORDER — LIDOCAINE HCL (CARDIAC) PF 100 MG/5ML IV SOSY
PREFILLED_SYRINGE | INTRAVENOUS | Status: DC | PRN
  Administered 2018-04-18: 50 mg via INTRAVENOUS

## 2018-04-18 MED ORDER — BACITRACIN 50000 UNITS IM SOLR
INTRAMUSCULAR | Status: AC
  Filled 2018-04-18: qty 1

## 2018-04-18 MED ORDER — CEFAZOLIN SODIUM-DEXTROSE 2-4 GM/100ML-% IV SOLN
2.0000 g | Freq: Once | INTRAVENOUS | Status: AC
  Administered 2018-04-18: 2 g via INTRAVENOUS

## 2018-04-18 MED ORDER — SUGAMMADEX SODIUM 500 MG/5ML IV SOLN
INTRAVENOUS | Status: DC | PRN
  Administered 2018-04-18: 225 mg via INTRAVENOUS

## 2018-04-18 MED FILL — Remifentanil HCl For IV Soln 1 MG: INTRAVENOUS | Qty: 1000 | Status: AC

## 2018-04-18 SURGICAL SUPPLY — 61 items
BASKET BONE COLLECTION (BASKET) IMPLANT
BULB RESERV EVAC DRAIN JP 100C (MISCELLANEOUS) IMPLANT
BUR NEURO DRILL SOFT 3.0X3.8M (BURR) ×3 IMPLANT
CANISTER SUCT 1200ML W/VALVE (MISCELLANEOUS) ×6 IMPLANT
CHLORAPREP W/TINT 26ML (MISCELLANEOUS) ×6 IMPLANT
COUNTER NEEDLE 20/40 LG (NEEDLE) ×3 IMPLANT
COVER LIGHT HANDLE STERIS (MISCELLANEOUS) ×6 IMPLANT
COVER WAND RF STERILE (DRAPES) ×3 IMPLANT
CRADLE LAMINECT ARM (MISCELLANEOUS) ×3 IMPLANT
CUP MEDICINE 2OZ PLAST GRAD ST (MISCELLANEOUS) ×3 IMPLANT
DERMABOND ADVANCED (GAUZE/BANDAGES/DRESSINGS) ×2
DERMABOND ADVANCED .7 DNX12 (GAUZE/BANDAGES/DRESSINGS) ×1 IMPLANT
DRAIN CHANNEL JP 10F RND 20C F (MISCELLANEOUS) IMPLANT
DRAPE C-ARM 42X72 X-RAY (DRAPES) ×6 IMPLANT
DRAPE LAPAROTOMY 77X122 PED (DRAPES) ×3 IMPLANT
DRAPE MICROSCOPE SPINE 48X150 (DRAPES) ×3 IMPLANT
DRAPE POUCH INSTRU U-SHP 10X18 (DRAPES) ×3 IMPLANT
DRAPE SURG 17X11 SM STRL (DRAPES) ×12 IMPLANT
ELECT CAUTERY BLADE TIP 2.5 (TIP) ×3
ELECT REM PT RETURN 9FT ADLT (ELECTROSURGICAL) ×3
ELECTRODE CAUTERY BLDE TIP 2.5 (TIP) ×1 IMPLANT
ELECTRODE REM PT RTRN 9FT ADLT (ELECTROSURGICAL) ×1 IMPLANT
FEE INTRAOP MONITOR IMPULS NCS (MISCELLANEOUS) IMPLANT
FRAME EYE SHIELD (PROTECTIVE WEAR) ×6 IMPLANT
GLOVE BIOGEL PI IND STRL 7.0 (GLOVE) ×1 IMPLANT
GLOVE BIOGEL PI INDICATOR 7.0 (GLOVE) ×2
GLOVE SURG SYN 7.0 (GLOVE) ×6 IMPLANT
GLOVE SURG SYN 7.0 PF PI (GLOVE) ×2 IMPLANT
GLOVE SURG SYN 8.5  E (GLOVE) ×6
GLOVE SURG SYN 8.5 E (GLOVE) ×3 IMPLANT
GLOVE SURG SYN 8.5 PF PI (GLOVE) ×3 IMPLANT
GOWN SRG XL LVL 3 NONREINFORCE (GOWNS) ×1 IMPLANT
GOWN STRL NON-REIN TWL XL LVL3 (GOWNS) ×2
GOWN STRL REUS W/TWL MED LVL3 (GOWN DISPOSABLE) ×3 IMPLANT
GRADUATE 1200CC STRL 31836 (MISCELLANEOUS) ×3 IMPLANT
INTRAOP MONITOR FEE IMPULS NCS (MISCELLANEOUS) ×1
INTRAOP MONITOR FEE IMPULSE (MISCELLANEOUS) ×2
KIT TURNOVER KIT A (KITS) ×3 IMPLANT
MARKER SKIN DUAL TIP RULER LAB (MISCELLANEOUS) ×6 IMPLANT
NDL SAFETY ECLIPSE 18X1.5 (NEEDLE) ×1 IMPLANT
NEEDLE HYPO 18GX1.5 SHARP (NEEDLE) ×2
NEEDLE HYPO 22GX1.5 SAFETY (NEEDLE) ×3 IMPLANT
NS IRRIG 1000ML POUR BTL (IV SOLUTION) ×3 IMPLANT
PACK LAMINECTOMY NEURO (CUSTOM PROCEDURE TRAY) ×3 IMPLANT
PIN CASPAR 14 (PIN) ×1 IMPLANT
PIN CASPAR 14MM (PIN) ×3
PLATE ANT CERV XTEND 2 LV 38 (Plate) ×2 IMPLANT
SCREW VAR 4.2 XD SELF DRILL 16 (Screw) ×12 IMPLANT
SPACER CERVICAL FRGE 12X14X8-7 (Spacer) ×4 IMPLANT
SPOGE SURGIFLO 8M (HEMOSTASIS) ×2
SPONGE KITTNER 5P (MISCELLANEOUS) ×3 IMPLANT
SPONGE SURGIFLO 8M (HEMOSTASIS) ×1 IMPLANT
STAPLER SKIN PROX 35W (STAPLE) IMPLANT
SUT V-LOC 90 ABS DVC 3-0 CL (SUTURE) ×3 IMPLANT
SUT VIC AB 3-0 SH 8-18 (SUTURE) ×3 IMPLANT
SYR 30ML LL (SYRINGE) ×3 IMPLANT
TAPE CLOTH 3X10 WHT NS LF (GAUZE/BANDAGES/DRESSINGS) ×3 IMPLANT
TOWEL OR 17X26 4PK STRL BLUE (TOWEL DISPOSABLE) ×9 IMPLANT
TRAY FOLEY MTR SLVR 16FR STAT (SET/KITS/TRAYS/PACK) IMPLANT
TUBING CONNECTING 10 (TUBING) ×2 IMPLANT
TUBING CONNECTING 10' (TUBING) ×1

## 2018-04-18 NOTE — Anesthesia Preprocedure Evaluation (Addendum)
Anesthesia Evaluation  Patient identified by MRN, date of birth, ID band Patient awake    Reviewed: Allergy & Precautions, NPO status , Patient's Chart, lab work & pertinent test results, reviewed documented beta blocker date and time   Airway Mallampati: III  TM Distance: >3 FB     Dental  (+) Chipped, Missing   Pulmonary sleep apnea , former smoker,           Cardiovascular hypertension, Pt. on medications and Pt. on home beta blockers + CAD and + Past MI  + dysrhythmias Atrial Fibrillation      Neuro/Psych    GI/Hepatic GERD  Controlled,  Endo/Other  diabetes, Type 2  Renal/GU      Musculoskeletal   Abdominal   Peds  Hematology   Anesthesia Other Findings Obese. EF 55-60 1 yr ago.  Reproductive/Obstetrics                            Anesthesia Physical Anesthesia Plan  ASA: III  Anesthesia Plan: General   Post-op Pain Management:    Induction: Intravenous  PONV Risk Score and Plan:   Airway Management Planned: Oral ETT  Additional Equipment:   Intra-op Plan:   Post-operative Plan:   Informed Consent: I have reviewed the patients History and Physical, chart, labs and discussed the procedure including the risks, benefits and alternatives for the proposed anesthesia with the patient or authorized representative who has indicated his/her understanding and acceptance.       Plan Discussed with: CRNA  Anesthesia Plan Comments:         Anesthesia Quick Evaluation

## 2018-04-18 NOTE — Anesthesia Procedure Notes (Signed)
Procedure Name: Intubation Performed by: Rolla Plate, CRNA Pre-anesthesia Checklist: Patient identified, Patient being monitored, Timeout performed, Emergency Drugs available and Suction available Patient Re-evaluated:Patient Re-evaluated prior to induction Oxygen Delivery Method: Circle system utilized Preoxygenation: Pre-oxygenation with 100% oxygen Induction Type: IV induction Ventilation: Mask ventilation without difficulty Laryngoscope Size: 3 and McGraph Grade View: Grade I Tube type: Oral Tube size: 7.0 mm Number of attempts: 1 Airway Equipment and Method: Stylet Placement Confirmation: ETT inserted through vocal cords under direct vision,  positive ETCO2 and breath sounds checked- equal and bilateral Secured at: 23 cm Tube secured with: Tape Dental Injury: Teeth and Oropharynx as per pre-operative assessment

## 2018-04-18 NOTE — Anesthesia Postprocedure Evaluation (Signed)
Anesthesia Post Note  Patient: Douglas Hunter  Procedure(s) Performed: ANTERIOR CERVICAL DECOMPRESSION/DISCECTOMY FUSION 2 LEVELS C5-7 (N/A Neck)  Patient location during evaluation: PACU Anesthesia Type: General Level of consciousness: awake and alert Pain management: pain level controlled Vital Signs Assessment: post-procedure vital signs reviewed and stable Respiratory status: spontaneous breathing, nonlabored ventilation, respiratory function stable and patient connected to nasal cannula oxygen Cardiovascular status: blood pressure returned to baseline and stable Postop Assessment: no apparent nausea or vomiting Anesthetic complications: no     Last Vitals:  Vitals:   04/18/18 1818 04/18/18 1857  BP: (!) 157/70 (!) 177/76  Pulse: 69 72  Resp:    Temp:  36.8 C  SpO2: 93% 98%    Last Pain:  Vitals:   04/18/18 1857  TempSrc: Oral  PainSc:                  Guynell Kleiber S

## 2018-04-18 NOTE — Progress Notes (Signed)
Pharmacy Antibiotic Note  Douglas Hunter is a 63 y.o. male admitted on 04/18/2018 with surgical prophylaxis.  Pharmacy has been consulted for Ancef dosing.  Plan: Will give Ancef 2 g x 1 30 min prior to the procedure.      No data recorded.  Recent Labs  Lab 04/11/18 1114  WBC 9.2  CREATININE 1.24    Estimated Creatinine Clearance: 75.4 mL/min (by C-G formula based on SCr of 1.24 mg/dL).    No Known Allergies  Antimicrobials this admission: 1/15 ancef >>    Dose adjustments this admission: none  Microbiology results: None  Thank you for allowing pharmacy to be a part of this patient's care.  Oswald Hillock, PharmD, BCPS 04/18/2018 9:04 AM

## 2018-04-18 NOTE — Anesthesia Post-op Follow-up Note (Signed)
Anesthesia QCD[facility] form completed.        

## 2018-04-18 NOTE — H&P (Signed)
  I have reviewed and confirmed my history and physical from 03/26/2018 with no additions or changes. Plan for ACDF C5-7.  Risks and benefits reviewed.  Heart sounds normal no MRG. Chest Clear to Auscultation Bilaterally.

## 2018-04-18 NOTE — Anesthesia Procedure Notes (Signed)
Performed by: Fletcher-Harrison, Cathlyn Tersigni, CRNA ° ° ° ° ° ° °

## 2018-04-18 NOTE — Op Note (Signed)
Indications: [name] is a 63 yo male who presented with cervical myelopathy with worsening symptoms.  He elected for surgical intervention.  Findings: severe cervical stenosis  Preoperative Diagnosis: Cervical myelopathy Postoperative Diagnosis: same   EBL: 25 ml IVF: 1200 ml Drains: none Disposition: Extubated and Stable to PACU Complications: none  No foley catheter was placed.   Preoperative Note:   Risks of surgery discussed include: infection, bleeding, stroke, coma, death, paralysis, CSF leak, nerve/spinal cord injury, numbness, tingling, weakness, complex regional pain syndrome, recurrent stenosis and/or disc herniation, vascular injury, development of instability, neck/back pain, need for further surgery, persistent symptoms, development of deformity, and the risks of anesthesia. The patient understood these risks and agreed to proceed.   Procedure:  1) Anterior cervical diskectomy and fusion at C5/6 and C6/7 2) Anterior cervical instrumentation at C5 - 7 using Globus Xtend 3) Placement of structural allograft  4) Use of operative microscope 5) Use of flouroscopy   Procedure: After obtaining informed consent, the patient taken to the operating room, placed in supine position, general anesthesia induced.  The patient had a small shoulder roll placed behind their shoulders.  The patient received preop antibiotics and IV Decadron.  The patient had a neck incision outlined, was prepped and draped in usual sterile fashion. The incision was injected with local anesthetic.   An incision was opened, dissection taken down medial to the carotid artery and jugular vein, lateral to the trachea and esophagus.  The prevertebral fascia identified and a localizing x-ray demonstrated the correct level.  The longus colli were dissected laterally, and self-retaining retractors placed to open the operative field. The microscope was then brought into the field.  With this complete, distractor  pins were placed in the vertebral bodies of C5 and C7. The distractor was placed, and the anuli at C5/6 and C6/7 were opened using a bovie.  Curettes and pituitary rongeurs used to remove the majority of disk, then the drill was used to remove the posterior osteophyte and begin the foraminotomies. The nerve hook was used to elevate the posterior longitudinal ligament, which was then removed with Kerrison rongeurs. The microblunt nerve hook could be passed out the foramen bilaterally at each level.   Meticulous hemostasis was obtained.  Structural allograft coated with demineralized bone matrix was tapped behind the anterior lip of the vertebral body at C5/6 (8 mm) and C6/7 (8 mm).    The caspar distractor was removed, and bone wax used for hemostasis. A separate, 38 mm Globus Xtend plate was chosen.  Two screws placed in each vertebral body, respectively making sure the screws were behind the locking mechanism.  Final AP and lateral radiographs were taken.   With everything in good position, the wound was irrigated copiously with bacitracin-containing solution and meticulous hemostasis obtained.  Wound was closed in 2 layers using interrupted inverted 3-0 Vicryl sutures.  The wound was dressed with dermabond, the head of bed at 30 degrees, taken to recovery room in stable condition.  No new postop neurological deficits were identified.  Sponge and pattie counts were correct at the end of the procedure.   Monitoring was stable throughout.   I performed the entire procedure with the assistance of Marin Olp PA as an Pensions consultant.  Meade Maw MD

## 2018-04-18 NOTE — Transfer of Care (Signed)
Immediate Anesthesia Transfer of Care Note  Patient: Douglas Hunter  Procedure(s) Performed: ANTERIOR CERVICAL DECOMPRESSION/DISCECTOMY FUSION 2 LEVELS C5-7 (N/A Neck)  Patient Location: PACU  Anesthesia Type:General  Level of Consciousness: awake  Airway & Oxygen Therapy: Patient Spontanous Breathing and Patient connected to face mask oxygen  Post-op Assessment: Report given to RN, Post -op Vital signs reviewed and stable and Patient moving all extremities X 4  Post vital signs: Reviewed  Last Vitals:  Vitals Value Taken Time  BP 165/73 04/18/2018  1:43 PM  Temp    Pulse 73 04/18/2018  1:43 PM  Resp 12 04/18/2018  1:43 PM  SpO2 100 % 04/18/2018  1:43 PM    Last Pain:  Vitals:   04/18/18 0900  TempSrc: Temporal  PainSc: 0-No pain         Complications: No apparent anesthesia complications

## 2018-04-18 NOTE — Progress Notes (Signed)
Procedure: C5-C7 ACDF Procedure date: 04/18/2018 Diagnosis: Cervical myelopathy  History: POD0:  Douglas Hunter is status post C5-C7 ACDF for cervical myelopathy.  Tolerated procedure well without complication.  Seen postop recovery still disoriented from anesthesia but able to answer questions and obey commands.  Denies any pain at this time.  Denies any upper extremity symptoms such as pain/numbness/tingling.  No issues swallowing.  Physical Exam: Vitals:   04/18/18 0900 04/18/18 1343  BP: (!) 159/64 (!) 165/73  Pulse: (!) 49 73  Resp:  12  Temp: 97.8 F (36.6 C) (!) 97.1 F (36.2 C)  SpO2: 100% 100%    AA Ox3 Strength:5/5 throughout upper and lower extremities Sensation: Intact and symmetric throughout upper and lower extremities Skin: Glue intact at incision site.  Data:  No results for input(s): NA, K, CL, CO2, BUN, CREATININE, LABGLOM, GLUCOSE, CALCIUM in the last 168 hours. No results for input(s): AST, ALT, ALKPHOS in the last 168 hours.  Invalid input(s): TBILI   No results for input(s): WBC, HGB, HCT, PLT in the last 168 hours. Recent Labs  Lab 04/18/18 0914  INR 0.93         Other tests/results: Cervical x-rays pending  Assessment/Plan:  Douglas Hunter is POD0 status post C5-C7 ACDF for cervical myelopathy.  He is recovering well.  We will continue to observe for adequate pain control and to monitor symptom resolution.  - mobilize - pain control - DVT prophylaxis  Marin Olp PA-C Department of Neurosurgery

## 2018-04-19 ENCOUNTER — Observation Stay: Payer: No Typology Code available for payment source

## 2018-04-19 DIAGNOSIS — G959 Disease of spinal cord, unspecified: Secondary | ICD-10-CM | POA: Diagnosis not present

## 2018-04-19 LAB — GLUCOSE, CAPILLARY: Glucose-Capillary: 337 mg/dL — ABNORMAL HIGH (ref 70–99)

## 2018-04-19 MED ORDER — METHOCARBAMOL 500 MG PO TABS: 500.0000 mg | ORAL_TABLET | Freq: Four times a day (QID) | ORAL | 0 refills | Status: DC | PRN

## 2018-04-19 MED ORDER — OXYCODONE HCL 5 MG PO TABS: 5.0000 mg | ORAL_TABLET | ORAL | 0 refills | Status: DC | PRN

## 2018-04-19 NOTE — Progress Notes (Signed)
Pt. has been trying to void without success. Bladder scan performed with a result of 424. Pt. request to be monitored with no intervention at this time. Pt. states that he usually does not have difficulty with voiding.  Will continue to monitor.

## 2018-04-19 NOTE — Progress Notes (Signed)
Pt. Discharged to home via private vehicle. Discharge instructions and medication regimen reviewed at bedside with patient. Pt. verbalizes understanding of instructions and medication regimen. Patient assessment unchanged from this morning. IV discontinued per policy.  

## 2018-04-19 NOTE — Discharge Summary (Addendum)
Procedure: C5-C7 ACDF Procedure date: 04/18/2018 Diagnosis: Cervical myelopathy  History: POD1:  He is recovering well. Has issues with voiding yesterday - bladder scan and catheter performed. Has been able to void independently at this time. Ambulated without issue and feels lower extremities are a little stronger. Swallowing with some discomfort but abl to tolerate soft food and liquid without issue. Currently denies any pain at this time.  Denies and new upper or lower extremity complaints including pain/numbness/tingling.   POD0:  Douglas Hunter is status post C5-C7 ACDF for cervical myelopathy.  Tolerated procedure well without complication.  Seen postop recovery still disoriented from anesthesia but able to answer questions and obey commands.  Denies any pain at this time.  Denies any upper extremity symptoms such as pain/numbness/tingling.  No issues swallowing.  Physical Exam: Vitals:   04/19/18 0412 04/19/18 0756  BP: (!) 150/70 (!) 144/55  Pulse: 60 (!) 55  Resp: 18 18  Temp: (!) 97.5 F (36.4 C) 98 F (36.7 C)  SpO2: 95% 99%    AA Ox3 Strength:5/5 throughout upper and lower extremities Sensation: Intact and symmetric throughout upper and lower extremities Skin: Glue intact at incision site. Bruising noted at inferior/lateral aspect on incision site. Minimal swelling. No tenderness, drainage, or active bleeding noted.   Data:  No results for input(s): NA, K, CL, CO2, BUN, CREATININE, LABGLOM, GLUCOSE, CALCIUM in the last 168 hours. No results for input(s): AST, ALT, ALKPHOS in the last 168 hours.  Invalid input(s): TBILI   No results for input(s): WBC, HGB, HCT, PLT in the last 168 hours. Recent Labs  Lab 04/18/18 0914  INR 0.93         Other tests/results: EXAM: CERVICAL SPINE - 2-3 VIEW  COMPARISON:  Fluoroscopy yesterday  FINDINGS: C5-6 and C6-7 ACDF with ventral plate. Hardware is in expected position. No evident fracture.  Prominent cervical  carotid calcification.  Mild prevertebral soft tissue thickening.  IMPRESSION: Expected appearance of recent C5-C7 ACDF.   Electronically Signed   By: Monte Fantasia M.D.   On: 04/19/2018 07:18  Assessment/Plan:  Douglas Hunter is recovering well and ready to go home. Will continue pain control with tylenol, oxycodone, and robaxin as needed. He will follow up in 2 weeks in clinic to monitor progress. Advised to contact office if any questions or concerns arise before then.  Advised on wound care and continuing soft diet until solid food is more tolerable.   Marin Olp PA-C Department of Neurosurgery

## 2018-04-19 NOTE — Care Management (Signed)
Met with patient and spouse to discuss DC, Patient states that he has no needs at this time.

## 2018-04-19 NOTE — Progress Notes (Signed)
Pt. voided 50 ml's of clear urine.  Will continue to monitor.

## 2018-04-19 NOTE — Discharge Instructions (Signed)

## 2018-04-20 ENCOUNTER — Encounter: Payer: Self-pay | Admitting: Neurosurgery

## 2018-05-01 ENCOUNTER — Other Ambulatory Visit: Payer: Self-pay | Admitting: Cardiovascular Disease

## 2018-05-02 NOTE — Telephone Encounter (Signed)
Please review for refill, Thanks !  

## 2018-05-09 ENCOUNTER — Ambulatory Visit (INDEPENDENT_AMBULATORY_CARE_PROVIDER_SITE_OTHER): Payer: 59

## 2018-05-09 DIAGNOSIS — I4892 Unspecified atrial flutter: Secondary | ICD-10-CM

## 2018-05-09 DIAGNOSIS — I480 Paroxysmal atrial fibrillation: Secondary | ICD-10-CM | POA: Diagnosis not present

## 2018-05-09 DIAGNOSIS — Z5181 Encounter for therapeutic drug level monitoring: Secondary | ICD-10-CM

## 2018-05-09 LAB — POCT INR: INR: 2.8 (ref 2.0–3.0)

## 2018-05-09 NOTE — Patient Instructions (Signed)
Please continue dosage of 1/2 tablet every day EXCEPT 1 TABLET ON MONDAYS & FRIDAYS. Recheck 5 weeks.

## 2018-06-06 ENCOUNTER — Other Ambulatory Visit: Payer: Self-pay | Admitting: Internal Medicine

## 2018-06-09 ENCOUNTER — Other Ambulatory Visit: Payer: Self-pay | Admitting: Cardiovascular Disease

## 2018-06-11 ENCOUNTER — Ambulatory Visit (INDEPENDENT_AMBULATORY_CARE_PROVIDER_SITE_OTHER): Payer: 59

## 2018-06-11 DIAGNOSIS — I4892 Unspecified atrial flutter: Secondary | ICD-10-CM

## 2018-06-11 DIAGNOSIS — I480 Paroxysmal atrial fibrillation: Secondary | ICD-10-CM

## 2018-06-11 DIAGNOSIS — Z5181 Encounter for therapeutic drug level monitoring: Secondary | ICD-10-CM

## 2018-06-11 LAB — POCT INR: INR: 3.1 — AB (ref 2.0–3.0)

## 2018-06-11 NOTE — Patient Instructions (Signed)
Please have a LARGE SERVING OF [place]ENS TODAY and continue dosage of 1/2 tablet every day EXCEPT 1 TABLET ON MONDAYS & FRIDAYS. Recheck 5 weeks.

## 2018-07-13 ENCOUNTER — Telehealth: Payer: Self-pay

## 2018-07-13 NOTE — Telephone Encounter (Signed)

## 2018-07-16 ENCOUNTER — Ambulatory Visit (INDEPENDENT_AMBULATORY_CARE_PROVIDER_SITE_OTHER): Payer: 59

## 2018-07-16 DIAGNOSIS — Z5181 Encounter for therapeutic drug level monitoring: Secondary | ICD-10-CM

## 2018-07-16 DIAGNOSIS — I4892 Unspecified atrial flutter: Secondary | ICD-10-CM | POA: Diagnosis not present

## 2018-07-16 DIAGNOSIS — I480 Paroxysmal atrial fibrillation: Secondary | ICD-10-CM | POA: Diagnosis not present

## 2018-07-16 LAB — POCT INR: INR: 3.2 — AB (ref 2.0–3.0)

## 2018-07-16 NOTE — Patient Instructions (Signed)
Please skip coumadin tonight, then continue dosage of 1/2 tablet every day EXCEPT 1 TABLET ON MONDAYS & FRIDAYS. Recheck 7 weeks.

## 2018-07-17 ENCOUNTER — Other Ambulatory Visit: Payer: Self-pay

## 2018-08-07 ENCOUNTER — Other Ambulatory Visit: Payer: Self-pay | Admitting: Cardiovascular Disease

## 2018-08-08 NOTE — Telephone Encounter (Signed)
Refill Request.  

## 2018-08-09 ENCOUNTER — Other Ambulatory Visit: Payer: Self-pay | Admitting: Cardiovascular Disease

## 2018-08-19 ENCOUNTER — Other Ambulatory Visit: Payer: Self-pay | Admitting: Cardiovascular Disease

## 2018-08-27 ENCOUNTER — Other Ambulatory Visit: Payer: Self-pay | Admitting: Cardiovascular Disease

## 2018-08-31 ENCOUNTER — Telehealth: Payer: Self-pay

## 2018-08-31 NOTE — Telephone Encounter (Signed)
Left message on pt's vm asking him to call and reschedule if he answers yes to any of the following questions:  1. COVID-19 Pre-Screening Questions:  . In the past 7 to 10 days have you had a cough,  shortness of breath, headache, congestion, fever (100 or greater) body aches, chills, sore throat, or sudden loss of taste or sense of smell? . Have you been around anyone with known Covid 19. . Have you been around anyone who is awaiting Covid 19 test results in the past 7 to 10 days? . Have you been around anyone who has been exposed to Covid 19, or has mentioned symptoms of Covid 19 within the past 7 to 10 days?   2. Pt advised of visitor restrictions (no visitors allowed except if needed to conduct the visit). Also advised to arrive at appointment time and wear a mask.

## 2018-09-03 ENCOUNTER — Other Ambulatory Visit: Payer: Self-pay

## 2018-09-03 ENCOUNTER — Ambulatory Visit (INDEPENDENT_AMBULATORY_CARE_PROVIDER_SITE_OTHER): Payer: 59

## 2018-09-03 DIAGNOSIS — Z5181 Encounter for therapeutic drug level monitoring: Secondary | ICD-10-CM | POA: Diagnosis not present

## 2018-09-03 DIAGNOSIS — I480 Paroxysmal atrial fibrillation: Secondary | ICD-10-CM

## 2018-09-03 DIAGNOSIS — I4892 Unspecified atrial flutter: Secondary | ICD-10-CM | POA: Diagnosis not present

## 2018-09-03 LAB — POCT INR: INR: 2.7 (ref 2.0–3.0)

## 2018-09-03 NOTE — Patient Instructions (Signed)
Please continue dosage of 1/2 tablet every day EXCEPT 1 TABLET ON MONDAYS.  Recheck 6 weeks.

## 2018-09-10 ENCOUNTER — Other Ambulatory Visit: Payer: Self-pay | Admitting: Cardiovascular Disease

## 2018-09-11 NOTE — Telephone Encounter (Signed)
Please review for refill.  

## 2018-09-19 ENCOUNTER — Other Ambulatory Visit: Payer: Self-pay | Admitting: Cardiovascular Disease

## 2018-09-21 ENCOUNTER — Telehealth: Payer: Self-pay | Admitting: Cardiovascular Disease

## 2018-09-21 ENCOUNTER — Encounter: Payer: Self-pay | Admitting: Cardiovascular Disease

## 2018-09-21 NOTE — Telephone Encounter (Signed)
error 

## 2018-09-21 NOTE — Telephone Encounter (Signed)
This encounter was created in error - please disregard.

## 2018-10-12 ENCOUNTER — Telehealth: Payer: Self-pay

## 2018-10-12 NOTE — Telephone Encounter (Signed)

## 2018-10-15 ENCOUNTER — Other Ambulatory Visit: Payer: Self-pay

## 2018-10-15 ENCOUNTER — Ambulatory Visit (INDEPENDENT_AMBULATORY_CARE_PROVIDER_SITE_OTHER): Payer: 59

## 2018-10-15 DIAGNOSIS — I480 Paroxysmal atrial fibrillation: Secondary | ICD-10-CM | POA: Diagnosis not present

## 2018-10-15 DIAGNOSIS — Z5181 Encounter for therapeutic drug level monitoring: Secondary | ICD-10-CM

## 2018-10-15 DIAGNOSIS — I4892 Unspecified atrial flutter: Secondary | ICD-10-CM | POA: Diagnosis not present

## 2018-10-15 LAB — POCT INR: INR: 2.8 (ref 2.0–3.0)

## 2018-10-15 NOTE — Patient Instructions (Signed)
Please continue dosage of 1/2 tablet every day EXCEPT 1 TABLET ON MONDAYS.  Recheck 6 weeks.

## 2018-10-29 ENCOUNTER — Other Ambulatory Visit: Payer: Self-pay | Admitting: Cardiovascular Disease

## 2018-11-19 ENCOUNTER — Other Ambulatory Visit: Payer: Self-pay

## 2018-11-19 ENCOUNTER — Ambulatory Visit (INDEPENDENT_AMBULATORY_CARE_PROVIDER_SITE_OTHER): Payer: 59

## 2018-11-19 DIAGNOSIS — I4892 Unspecified atrial flutter: Secondary | ICD-10-CM | POA: Diagnosis not present

## 2018-11-19 DIAGNOSIS — I480 Paroxysmal atrial fibrillation: Secondary | ICD-10-CM

## 2018-11-19 DIAGNOSIS — Z5181 Encounter for therapeutic drug level monitoring: Secondary | ICD-10-CM

## 2018-11-19 LAB — POCT INR: INR: 2.3 (ref 2.0–3.0)

## 2018-11-19 NOTE — Patient Instructions (Signed)
Please continue dosage of 1/2 tablet every day EXCEPT 1 TABLET ON MONDAYS.  Recheck 6 weeks.

## 2018-11-21 ENCOUNTER — Other Ambulatory Visit: Payer: Self-pay | Admitting: Cardiovascular Disease

## 2018-11-21 NOTE — Telephone Encounter (Signed)
Please review for refill.  

## 2018-12-02 ENCOUNTER — Other Ambulatory Visit: Payer: Self-pay | Admitting: Internal Medicine

## 2018-12-10 ENCOUNTER — Other Ambulatory Visit: Payer: Self-pay | Admitting: Cardiovascular Disease

## 2018-12-31 ENCOUNTER — Ambulatory Visit (INDEPENDENT_AMBULATORY_CARE_PROVIDER_SITE_OTHER): Payer: 59

## 2018-12-31 ENCOUNTER — Other Ambulatory Visit: Payer: Self-pay

## 2018-12-31 DIAGNOSIS — I480 Paroxysmal atrial fibrillation: Secondary | ICD-10-CM

## 2018-12-31 DIAGNOSIS — I4892 Unspecified atrial flutter: Secondary | ICD-10-CM

## 2018-12-31 DIAGNOSIS — Z5181 Encounter for therapeutic drug level monitoring: Secondary | ICD-10-CM | POA: Diagnosis not present

## 2018-12-31 LAB — POCT INR: INR: 3.1 — AB (ref 2.0–3.0)

## 2018-12-31 NOTE — Patient Instructions (Signed)
Please take 1/2 tablet tonight, then continue dosage of 1/2 tablet every day EXCEPT 1 TABLET ON MONDAYS.  Recheck 6 weeks.

## 2019-01-16 ENCOUNTER — Other Ambulatory Visit: Payer: Self-pay | Admitting: Cardiovascular Disease

## 2019-01-16 NOTE — Telephone Encounter (Signed)
Refill request

## 2019-02-05 ENCOUNTER — Telehealth: Payer: Self-pay

## 2019-02-05 MED ORDER — ISOSORBIDE MONONITRATE ER 30 MG PO TB24: 30.0000 mg | ORAL_TABLET | Freq: Every day | ORAL | 0 refills | Status: DC

## 2019-02-05 NOTE — Telephone Encounter (Signed)
Requested Prescriptions   Signed Prescriptions Disp Refills  . isosorbide mononitrate (IMDUR) 30 MG 24 hr tablet 90 tablet 0    Sig: Take 1 tablet (30 mg total) by mouth daily. *NEEDS OFFICE VISIT FOR FURTHER REFILLS-PLEASE CALL [phone] TO SCHEDULE.*    Authorizing Provider: Rise Mu    Ordering User: Raelene Bott, BRANDY L

## 2019-02-11 ENCOUNTER — Ambulatory Visit (INDEPENDENT_AMBULATORY_CARE_PROVIDER_SITE_OTHER): Payer: 59

## 2019-02-11 ENCOUNTER — Other Ambulatory Visit: Payer: Self-pay

## 2019-02-11 DIAGNOSIS — Z5181 Encounter for therapeutic drug level monitoring: Secondary | ICD-10-CM

## 2019-02-11 DIAGNOSIS — I4892 Unspecified atrial flutter: Secondary | ICD-10-CM

## 2019-02-11 LAB — POCT INR: INR: 2.5 (ref 2.0–3.0)

## 2019-02-11 NOTE — Patient Instructions (Signed)
Please continue dosage of 1/2 tablet every day EXCEPT 1 TABLET ON MONDAYS.  Recheck 6 weeks.   

## 2019-02-19 ENCOUNTER — Other Ambulatory Visit: Payer: Self-pay

## 2019-02-19 MED ORDER — AMIODARONE HCL 200 MG PO TABS: 100.0000 mg | ORAL_TABLET | Freq: Every day | ORAL | 0 refills | Status: DC

## 2019-02-19 NOTE — Telephone Encounter (Signed)
Requested Prescriptions   Signed Prescriptions Disp Refills  . amiodarone (PACERONE) 200 MG tablet 45 tablet 0    Sig: Take 0.5 tablets (100 mg total) by mouth daily. *NEEDS OFFICE VISIT FOR FURTHER REFILLS*    Authorizing Provider: Minna Merritts    Ordering User: Raelene Bott, Keithen Capo L

## 2019-03-13 ENCOUNTER — Other Ambulatory Visit: Payer: Self-pay | Admitting: Cardiovascular Disease

## 2019-03-13 NOTE — Telephone Encounter (Signed)
Please review for refill. Thanks!  

## 2019-03-25 ENCOUNTER — Other Ambulatory Visit: Payer: Self-pay

## 2019-03-25 ENCOUNTER — Ambulatory Visit (INDEPENDENT_AMBULATORY_CARE_PROVIDER_SITE_OTHER): Payer: 59

## 2019-03-25 DIAGNOSIS — I480 Paroxysmal atrial fibrillation: Secondary | ICD-10-CM | POA: Diagnosis not present

## 2019-03-25 DIAGNOSIS — I4892 Unspecified atrial flutter: Secondary | ICD-10-CM | POA: Diagnosis not present

## 2019-03-25 DIAGNOSIS — Z5181 Encounter for therapeutic drug level monitoring: Secondary | ICD-10-CM

## 2019-03-25 LAB — POCT INR: INR: 2.9 (ref 2.0–3.0)

## 2019-03-25 NOTE — Patient Instructions (Signed)
Please continue dosage of 1/2 tablet every day EXCEPT 1 TABLET ON MONDAYS.  Recheck 6 weeks.

## 2019-05-06 ENCOUNTER — Ambulatory Visit (INDEPENDENT_AMBULATORY_CARE_PROVIDER_SITE_OTHER): Payer: 59

## 2019-05-06 ENCOUNTER — Other Ambulatory Visit: Payer: Self-pay | Admitting: Cardiovascular Disease

## 2019-05-06 ENCOUNTER — Other Ambulatory Visit: Payer: Self-pay

## 2019-05-06 DIAGNOSIS — I4892 Unspecified atrial flutter: Secondary | ICD-10-CM | POA: Diagnosis not present

## 2019-05-06 DIAGNOSIS — I480 Paroxysmal atrial fibrillation: Secondary | ICD-10-CM | POA: Diagnosis not present

## 2019-05-06 DIAGNOSIS — Z5181 Encounter for therapeutic drug level monitoring: Secondary | ICD-10-CM

## 2019-05-06 LAB — POCT INR: INR: 2.6 (ref 2.0–3.0)

## 2019-05-06 NOTE — Telephone Encounter (Signed)
Refill request

## 2019-05-06 NOTE — Patient Instructions (Signed)
Please continue dosage of 1/2 tablet every day EXCEPT 1 TABLET ON MONDAYS.  Recheck 6 weeks.

## 2019-05-14 ENCOUNTER — Other Ambulatory Visit: Payer: Self-pay | Admitting: Cardiovascular Disease

## 2019-05-27 NOTE — Progress Notes (Signed)
Cardiology Office Note  Date:  05/28/2019   ID:  Douglas Hunter, Douglas Hunter Aug 22, 1955, MRN [id number]  PCP:  Sofie Hartigan, MD   Chief Complaint  Patient presents with  . office visit    12 mo F/U; Meds verbally reviewed with patient.    HPI:  Douglas Hunter is a pleasant 64 -year-old gentleman with  CAD,  non-STEMI with admission to the hospital in late April 2012  cardiac catheterization showing severe mid left circumflex disease, subtotally occluded, with collaterals from the RCA to the LAD circumflex,  normal ejection fraction  hospital shortly later with chest pain.   started on medical management,  nitrates and ranexa for his symptoms.  sleep apnea but has not had a sleep study.  episodes of atrial flutter. He reports symptoms dating back into his 60s.  2 recent hospitalizations for atrial flutter, 08/11/2012 and recently on 01/11/2013. Both episodes required hospitalization, medications for cardioversion. 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 Rayann Heman,  for typical atrial flutter, atrial fibrillation.   Prior Echocardiogram shows normal LV systolic function, essentially normal study, May 2013  LDL 81, hemoglobin A1c 8.1, total cholesterol 144, HDL 50   PMH:   has a past medical history of Atrial flutter  ([place]), Coronary artery disease (08/2011), GERD (gastroesophageal reflux disease), Hyperlipidemia, Hypertension, Myocardial infarction [facility]), Paroxysmal atrial fibrillation ([place]), Poorly controlled diabetes mellitus ([place]), RBBB, and Sleep apnea.  PSH:    Past Surgical History:  Procedure Laterality Date  . ABLATION  01/29/2013   PVI and CTI by Dr Rayann Heman for atrial flutter and paroxysmal atrial fibrillation  . ANTERIOR CERVICAL DECOMP/DISCECTOMY FUSION N/A 04/18/2018   Procedure: ANTERIOR CERVICAL DECOMPRESSION/DISCECTOMY FUSION 2 LEVELS C5-7;  Surgeon: Meade Maw, MD;  Location: [facility] ORS;  Service: Neurosurgery;  Laterality: N/A;  . ATRIAL FIBRILLATION ABLATION N/A 01/29/2013   Procedure: ATRIAL FIBRILLATION ABLATION;  Surgeon: Coralyn Mark, MD;  Location: [place] CATH LAB;  Service: Cardiovascular;  Laterality: N/A;  . CARDIAC CATHETERIZATION  08/03/2011  . COLONOSCOPY WITH PROPOFOL N/A 02/02/2016   Procedure: COLONOSCOPY WITH PROPOFOL;  Surgeon: Lucilla Lame, MD;  Location: [facility] ENDOSCOPY;  Service: Endoscopy;  Laterality: N/A;  . LUMBAR LAMINECTOMY/DECOMPRESSION MICRODISCECTOMY N/A 06/07/2017   Procedure: LUMBAR LAMINECTOMY/DECOMPRESSION MICRODISCECTOMY 2 LEVELS-L3-4,L4-5;  Surgeon: Meade Maw, MD;  Location: [facility] ORS;  Service: Neurosurgery;  Laterality: N/A;  . TEE WITHOUT CARDIOVERSION N/A 01/28/2013   Procedure: TRANSESOPHAGEAL ECHOCARDIOGRAM (TEE);  Surgeon: Lelon Perla, MD;  Location: [facility] ENDOSCOPY;  Service: Cardiovascular;  Laterality: N/A;    Current Outpatient Medications  Medication Sig Dispense Refill  . acetaminophen (TYLENOL) 500 MG tablet Take 500 mg by mouth every 6 (six) hours as needed for pain.     Marland Kitchen amiodarone (PACERONE) 200 MG tablet TAKE 1/2 TABLET(100 MG) BY MOUTH DAILY 45 tablet 3  . atorvastatin (  LIPITOR) 80 MG tablet Take 80 mg by mouth at bedtime.     . benazepril (LOTENSIN) 40 MG tablet Take 1 tablet (40 mg total) by mouth daily. 90 tablet 3  .  ezetimibe (ZETIA) 10 MG tablet TAKE 1 TABLET(10 MG) BY MOUTH DAILY 90 tablet 3  . furosemide (LASIX) 20 MG tablet TAKE 1 TABLET BY MOUTH TWICE DAILY AS NEEDED 60 tablet 5  . insulin degludec (TRESIBA) 100 UNIT/ML SOPN FlexTouch Pen Inject 55 Units into the skin at bedtime.     . insulin lispro (HUMALOG) 100 UNIT/ML KiwkPen Inject 8 Units into the skin 3 (three) times daily.     . INSULIN SYRINGE 1CC/29G 29G X 1/2" 1 ML MISC USE AS DIRECTED 100 each PRN  . isosorbide mononitrate (IMDUR) 30 MG 24 hr tablet Take 1 tablet (30 mg total) by mouth daily. 90 tablet 3  . levothyroxine (SYNTHROID) 50 MCG tablet Take 50 mcg by mouth daily.    . metoprolol tartrate (LOPRESSOR) 25 MG tablet Take 1 tablet (25 mg total) by mouth 2 (two) times daily. 180 tablet 3  . nitroGLYCERIN (NITROSTAT) 0.4 MG SL tablet Place 1 tablet (0.4 mg total) under the tongue every 5 (five) minutes as needed for chest pain. 25 tablet 3  . ONE TOUCH ULTRA TEST test strip TEST THREE TIMES A DAY AS DIRECTED. 100 each 12  . sildenafil (REVATIO) 20 MG tablet Take 1 tablet by mouth three times daily as needed 90 tablet 2  . warfarin (COUMADIN) 5 MG tablet TAKE 1 TABLET BY MOUTH EVERY DAY AS DIRECTED BY COUMADIN CLINIC 30 tablet 1  . insulin aspart protamine- aspart (NOVOLOG MIX 70/30) (70-30) 100 UNIT/ML injection Inject 8 Units into the skin 2 (two) times daily with a meal.    . magnesium oxide (MAG-OX) 400 MG tablet Take 1 tablet (400 mg total) by mouth 2 (two) times daily. 180 tablet 3  . metFORMIN (GLUCOPHAGE) 500 MG tablet TAKE 1 TABLET(500 MG) BY MOUTH FOUR TIMES DAILY (Patient taking differently: Take 500 mg by mouth 2 (two) times daily with a meal. ) 360 tablet 0  . methocarbamol (ROBAXIN) 500 MG tablet Take 1 tablet (500 mg total) by mouth every 6 (six) hours as needed for muscle spasms. 30 tablet 0  . omeprazole (PRILOSEC) 20 MG capsule Take 1 capsule (20 mg total) by mouth 2 (two) times daily before a meal. (Patient not taking:  Reported on 04/09/2018) 180 capsule 3  . oxyCODONE (OXY IR/ROXICODONE) 5 MG immediate release tablet Take 1 tablet (5 mg total) by mouth every 3 (three) hours as needed for moderate pain ((score 4 to 6)). 10 tablet 0  . VIAGRA 100 MG tablet TAKE 1 TABLET BY MOUTH EVERY DAY AS NEEDED (Patient not taking: Reported on 04/18/2018) 10 tablet 0   No current facility-administered medications for this visit.    Allergies:   Patient has no known allergies.   Social History:  The patient  reports that he quit smoking about 11 years ago. His smoking use included cigarettes. He has a 52.50 pack-year smoking history. He has never used smokeless tobacco. He reports previous alcohol use. He reports that he does not use drugs.   Family History:   family history includes Cancer in his father; Diabetes in his son; Heart attack in his maternal uncle; Heart attack (age of onset: 79) in his father; Heart disease in his brother, mother, and paternal grandfather; Stroke in his mother.    Review of Systems:  Review of Systems  Constitutional: Negative.   HENT: Negative.   Respiratory: Negative.   Cardiovascular: Negative.   Gastrointestinal: Negative.   Musculoskeletal: Negative.   Neurological: Negative.   Psychiatric/Behavioral: Negative.   All other systems reviewed and are negative.   PHYSICAL EXAM: VS:  BP 120/62 (BP Location: Left Arm, Patient Position: Sitting, Cuff Size: Large)   Pulse (!) 53   Temp 99.7 F (37.6 C)   Ht 5\' 11"  (1.803 m)   Wt 259 lb (117.5 kg)   SpO2 98%   BMI 36.12 kg/m  , BMI Body mass index is 36.12 kg/m. Constitutional:  oriented to person, place, and time. No distress.  HENT:  Head: Grossly normal Eyes:  no discharge. No scleral icterus.  Neck: No JVD, no carotid bruits  Cardiovascular: Regular rate and rhythm, no murmurs appreciated Pulmonary/Chest: Clear to auscultation bilaterally, no wheezes or rails Abdominal: Soft.  no distension.  no tenderness.   Musculoskeletal: Normal range of motion Neurological:  normal muscle tone. Coordination normal. No atrophy Skin: Skin warm and dry Psychiatric: normal affect, pleasant   Recent Labs: No results found for requested labs within last 8760 hours.    Lipid Panel Lab Results  Component Value Date   CHOL 162 01/13/2015   HDL 47 01/12/2013   LDLCALC 49 01/12/2013   TRIG 62 01/13/2015  old numbers    Wt Readings from Last 3 Encounters:  05/28/19 259 lb (117.5 kg)  04/18/18 240 lb 4.8 oz (109 kg)  04/11/18 240 lb (108.9 kg)     ASSESSMENT AND PLAN:  Paroxysmal atrial fibrillation ([place]) - Plan: EKG 12-Lead  compliant with his warfarin,  On low-dose amiodarone and metoprolol  Leg weakness Has some, but better after surgery  Coronary artery disease involving native coronary artery of native heart without angina pectoris Currently with no symptoms of angina. No further workup at this time. Continue current medication regimen.  Essential hypertension Blood pressure is well controlled on today's visit. No changes made to the medications.  Mixed hyperlipidemia Cholesterol is at goal on the current lipid regimen. No changes to the medications were made.  Type 2 diabetes mellitus with other circulatory complication, with long-term current use of insulin (HCC) Hemoglobin A1c9 Recommended low carbohydrates Diet guide provided   Total encounter time more than 25 minutes  Greater than 50% was spent in counseling and coordination of care with the patient  Disposition:   F/U  12 months   Orders Placed This Encounter  Procedures  . EKG 12-Lead     Signed, Esmond Plants, M.D., Ph.D. 05/28/2019  [place], [place]

## 2019-05-28 ENCOUNTER — Other Ambulatory Visit: Payer: Self-pay

## 2019-05-28 ENCOUNTER — Ambulatory Visit (INDEPENDENT_AMBULATORY_CARE_PROVIDER_SITE_OTHER): Payer: 59 | Admitting: Cardiovascular Disease

## 2019-05-28 ENCOUNTER — Encounter: Payer: Self-pay | Admitting: Cardiovascular Disease

## 2019-05-28 VITALS — BP 120/62 | HR 53 | Temp 99.7°F | Ht 71.0 in | Wt 259.0 lb

## 2019-05-28 DIAGNOSIS — E1165 Type 2 diabetes mellitus with hyperglycemia: Secondary | ICD-10-CM | POA: Diagnosis not present

## 2019-05-28 DIAGNOSIS — I10 Essential (primary) hypertension: Secondary | ICD-10-CM | POA: Diagnosis not present

## 2019-05-28 DIAGNOSIS — I480 Paroxysmal atrial fibrillation: Secondary | ICD-10-CM | POA: Diagnosis not present

## 2019-05-28 DIAGNOSIS — I4892 Unspecified atrial flutter: Secondary | ICD-10-CM | POA: Diagnosis not present

## 2019-05-28 DIAGNOSIS — E782 Mixed hyperlipidemia: Secondary | ICD-10-CM

## 2019-05-28 MED ORDER — METOPROLOL TARTRATE 25 MG PO TABS: 25.0000 mg | ORAL_TABLET | Freq: Two times a day (BID) | ORAL | 3 refills | Status: DC

## 2019-05-28 MED ORDER — AMIODARONE HCL 200 MG PO TABS: ORAL_TABLET | ORAL | 3 refills | Status: DC

## 2019-05-28 MED ORDER — ISOSORBIDE MONONITRATE ER 30 MG PO TB24: 30.0000 mg | ORAL_TABLET | Freq: Every day | ORAL | 3 refills | Status: DC

## 2019-05-28 MED ORDER — BENAZEPRIL HCL 40 MG PO TABS: 40.0000 mg | ORAL_TABLET | Freq: Every day | ORAL | 3 refills | Status: DC

## 2019-05-28 MED ORDER — NITROGLYCERIN 0.4 MG SL SUBL: 0.4000 mg | SUBLINGUAL_TABLET | SUBLINGUAL | 3 refills | Status: DC | PRN

## 2019-05-28 MED ORDER — EZETIMIBE 10 MG PO TABS: ORAL_TABLET | ORAL | 3 refills | Status: DC

## 2019-05-28 NOTE — Patient Instructions (Addendum)
Medication Instructions:  Skip the lasix /furosemide 2 days a week  If you need a refill on your cardiac medications before your next appointment, please call your pharmacy.    Lab work: No new labs needed   If you have labs (blood work) drawn today and your tests are completely normal, you will receive your results only by: Marland Kitchen MyChart Message (if you have MyChart) OR . A paper copy in the mail If you have any lab test that is abnormal or we need to change your treatment, we will call you to review the results.   Testing/Procedures: No new testing needed   Follow-Up: At [facility], you and your health needs are our priority.  As part of our continuing mission to provide you with exceptional heart care, we have created designated Provider Care Teams.  These Care Teams include your primary Cardiologist (physician) and Advanced Practice Providers (APPs -  Physician Assistants and Nurse Practitioners) who all work together to provide you with the care you need, when you need it.  . You will need a follow up appointment in 12 months   . Providers on your designated Care Team:   . Murray Hodgkins, NP . Christell Faith, PA-C . Marrianne Mood, PA-C  Any Other Special Instructions Will Be Listed Below (If Applicable).  COVID-19 Vaccine Information can be found at: [email or web address].uk For questions related to vaccine distribution or appointments, please email [email or web address] or call [phone].

## 2019-06-03 ENCOUNTER — Telehealth: Payer: Self-pay | Admitting: Cardiovascular Disease

## 2019-06-03 NOTE — Telephone Encounter (Signed)
Spoke with patient and he wanted to know if Dr. Rockey Situ could send in something for his nerves due to loss of his brother. Provided emotional support for his loss but that he would need to check with his primary care provider. He verbalized understanding with no further questions at this time.

## 2019-06-03 NOTE — Telephone Encounter (Signed)
Patient calling in due to a death in the family/. Patients brother died recently and patient is having a very difficult time. Patient wanting to know if Dr. Rockey Situ may be able to call something in for him  Please advise

## 2019-06-04 ENCOUNTER — Other Ambulatory Visit: Payer: Self-pay | Admitting: Cardiovascular Disease

## 2019-06-17 ENCOUNTER — Ambulatory Visit (INDEPENDENT_AMBULATORY_CARE_PROVIDER_SITE_OTHER): Payer: Medicare Other

## 2019-06-17 ENCOUNTER — Other Ambulatory Visit: Payer: Self-pay

## 2019-06-17 DIAGNOSIS — I480 Paroxysmal atrial fibrillation: Secondary | ICD-10-CM | POA: Diagnosis not present

## 2019-06-17 DIAGNOSIS — I4892 Unspecified atrial flutter: Secondary | ICD-10-CM | POA: Diagnosis not present

## 2019-06-17 DIAGNOSIS — Z5181 Encounter for therapeutic drug level monitoring: Secondary | ICD-10-CM

## 2019-06-17 LAB — POCT INR: INR: 2.7 (ref 2.0–3.0)

## 2019-06-17 NOTE — Patient Instructions (Signed)
Please continue warfarin dosage of 1/2 tablet every day EXCEPT 1 TABLET ON MONDAYS.  Recheck 6 weeks.

## 2019-06-24 ENCOUNTER — Other Ambulatory Visit: Payer: Self-pay

## 2019-06-24 ENCOUNTER — Ambulatory Visit: Payer: Medicare Other | Attending: Internal Medicine

## 2019-06-24 DIAGNOSIS — Z23 Encounter for immunization: Secondary | ICD-10-CM

## 2019-06-24 NOTE — Progress Notes (Signed)
   Covid-19 Vaccination Clinic  Name:  COOLIDGE COFFING    MRN: [id number] DOB: 02/14/1956  06/24/2019  Mr. Deloatch was observed post Covid-19 immunization for 15 minutes without incident. He was provided with Vaccine Information Sheet and instruction to access the V-Safe system.   Mr. Kuefler was instructed to call 911 with any severe reactions post vaccine: Marland Kitchen Difficulty breathing  . Swelling of face and throat  . A fast heartbeat  . A bad rash all over body  . Dizziness and weakness   Immunizations Administered    Name Date Dose VIS Date Route   Pfizer COVID-19 Vaccine 06/24/2019 10:36 AM 0.3 mL 03/15/2019 Intramuscular   Manufacturer: [place]   Lot: [id number]   [place]: [id number]

## 2019-07-10 ENCOUNTER — Other Ambulatory Visit: Payer: Self-pay | Admitting: Cardiovascular Disease

## 2019-07-10 NOTE — Telephone Encounter (Signed)
Please review for refill, Thanks !  

## 2019-07-17 ENCOUNTER — Ambulatory Visit: Payer: Medicare Other | Attending: Internal Medicine

## 2019-07-17 DIAGNOSIS — Z23 Encounter for immunization: Secondary | ICD-10-CM

## 2019-07-17 NOTE — Progress Notes (Signed)
   Covid-19 Vaccination Clinic  Name:  Douglas Hunter    MRN: [id number] DOB: [birth year]  07/17/2019  [name] was observed post Covid-19 immunization for 15 minutes without incident. He was provided with Vaccine Information Sheet and instruction to access the V-Safe system.   [name] was instructed to call 911 with any severe reactions post vaccine: Marland Kitchen Difficulty breathing  . Swelling of face and throat  . A fast heartbeat  . A bad rash all over body  . Dizziness and weakness   Immunizations Administered    Name Date Dose VIS Date Route   Pfizer COVID-19 Vaccine 07/17/2019 10:52 AM 0.3 mL 03/15/2019 Intramuscular   Manufacturer: [place]   Lot: [id number]   [place]: [id number]

## 2019-07-29 ENCOUNTER — Ambulatory Visit (INDEPENDENT_AMBULATORY_CARE_PROVIDER_SITE_OTHER): Payer: Medicare Other

## 2019-07-29 ENCOUNTER — Other Ambulatory Visit: Payer: Self-pay

## 2019-07-29 DIAGNOSIS — Z5181 Encounter for therapeutic drug level monitoring: Secondary | ICD-10-CM

## 2019-07-29 DIAGNOSIS — I480 Paroxysmal atrial fibrillation: Secondary | ICD-10-CM | POA: Diagnosis not present

## 2019-07-29 DIAGNOSIS — I4892 Unspecified atrial flutter: Secondary | ICD-10-CM | POA: Diagnosis not present

## 2019-07-29 LAB — POCT INR: INR: 2.3 (ref 2.0–3.0)

## 2019-07-29 NOTE — Patient Instructions (Signed)
Please continue warfarin dosage of 1/2 tablet every day EXCEPT 1 TABLET ON MONDAYS.  Recheck INR 6 weeks. 

## 2019-08-08 ENCOUNTER — Ambulatory Visit (INDEPENDENT_AMBULATORY_CARE_PROVIDER_SITE_OTHER): Payer: Medicare Other | Admitting: Pulmonary Disease

## 2019-08-08 ENCOUNTER — Encounter: Payer: Self-pay | Admitting: Pulmonary Disease

## 2019-08-08 ENCOUNTER — Other Ambulatory Visit: Payer: Self-pay

## 2019-08-08 VITALS — BP 120/62 | HR 45 | Temp 97.8°F | Ht 71.0 in | Wt 256.0 lb

## 2019-08-08 DIAGNOSIS — G4730 Sleep apnea, unspecified: Secondary | ICD-10-CM

## 2019-08-08 DIAGNOSIS — Z9989 Dependence on other enabling machines and devices: Secondary | ICD-10-CM

## 2019-08-08 DIAGNOSIS — E669 Obesity, unspecified: Secondary | ICD-10-CM

## 2019-08-08 DIAGNOSIS — G4700 Insomnia, unspecified: Secondary | ICD-10-CM | POA: Diagnosis not present

## 2019-08-08 DIAGNOSIS — G4733 Obstructive sleep apnea (adult) (pediatric): Secondary | ICD-10-CM

## 2019-08-08 MED ORDER — ZOLPIDEM TARTRATE 1.75 MG SL SUBL: 1.7500 mg | SUBLINGUAL_TABLET | Freq: Every evening | SUBLINGUAL | 1 refills | Status: DC | PRN

## 2019-08-08 NOTE — Patient Instructions (Signed)
Zolpidem 1.75 mg under your tongue as needed when you have trouble falling back to sleep  Follow up in 1 year

## 2019-08-08 NOTE — Progress Notes (Signed)
[facility]  Chief Complaint  Patient presents with  . Follow-up    c/o trouble staying asleep at nigh, waking up 2-3 times a  night, using C-Pap    Constitutional:  BP 120/62 (BP Location: Left Arm, Cuff Size: Large)   Pulse (!) 45   Temp 97.8 F (36.6 C) (Temporal)   Ht 5\' 11"  (1.803 m)   Wt 256 lb (116.1 kg)   SpO2 98%   BMI 35.70 kg/m   Past Medical History:  A fib, DM, GERD, CAD, ED, HTN, HLD  Summary:  Douglas Hunter is a 64 y.o. male with obstructive sleep apnea, and insomnia.  Subjective:   Previously seen by Dr. Ashby Dawes.  Had home sleep study in 2018.  Severe OSA.  Has been on CPAP since.  Uses nasal pillows.  Occasional mouth dryness.  Goes to bed at 11 pm.  Falls asleep okay, but then wakes up to use bathroom after few hours.  Will have trouble falling back to sleep.  Denies feelings of anxiety/depression, back/leg pain.  He usually puts his CPAP on after he goes back to sleep.  He sometimes has to go out to the couch to sleep.  He wakes up around 530 on weekdays, and 7 am on weekends.  Feels better when he has more time to sleep.   He got the [place] vaccines.  Physical Exam:   Appearance - well kempt  ENMT - no sinus tenderness, no nasal discharge, no oral exudate, Mallampati 3, elongated uvula.  Respiratory - no wheeze, or rales  CV - regular rate and rhythm, no murmurs  GI - soft, non tender  Lymph - no adenopathy noted in neck  Ext - no edema  Skin - no rashes  Neuro - normal strength, oriented x 3  Psych - normal mood and affect   Assessment/Plan:   Obstructive sleep apnea. - he is compliant with CPAP and reports benefit - continue auto CPAP  Insomnia with sleep apnea. - reviewed proper sleep hygiene - discussed importance of maintaining a regular sleep/wake schedule - will have him try zolpidem sublingual 1.75 mg prn when he has trouble falling back to sleep - advised him to use  caution with medication until he knows how he will respond to medication  Obesity. - reviewed importance of weight loss  Insufficient sleep. - discussed importance of allowing enough time for sleep  A total of  32 minutes spent addressing patient care issues on day of visit.  Follow up:  Patient Instructions  Zolpidem 1.75 mg under your tongue as needed when you have trouble falling back to sleep  Follow up in 1 year   Signature:  Chesley Mires, MD [place] Pager: [phone] 08/08/2019, 9:22 AM  Flow Sheet    Sleep tests:  HST 12/08/16 >> AHI 30, SpO2 low 81% Auto CPAP 07/08/19 to 08/06/19 >> used on 28 of 30 nights with average 4 hrs 51 min.  Average AHI 2.4 with median CPAP 8 and 95 th percentile CPAP 12 cm H2O.  Cardiac tests:  Echo 11/14/16 >> EF 55 to 60%, PAS 39 mmHg  Medications:   Allergies as of 08/08/2019   No Known Allergies     Medication List       Accurate as of Aug 08, 2019  9:22 AM. If you have any questions, ask your nurse or doctor.        acetaminophen 500 MG tablet Commonly known as:  TYLENOL Take 500 mg by mouth every 6 (six) hours as needed for pain.   amiodarone 200 MG tablet Commonly known as: PACERONE TAKE 1/2 TABLET(100 MG) BY MOUTH DAILY   atorvastatin 80 MG tablet Commonly known as: LIPITOR Take 80 mg by mouth at bedtime.   benazepril 40 MG tablet Commonly known as: LOTENSIN Take 1 tablet (40 mg total) by mouth daily.   ezetimibe 10 MG tablet Commonly known as: ZETIA TAKE 1 TABLET(10 MG) BY MOUTH DAILY   furosemide 20 MG tablet Commonly known as: LASIX TAKE 1 TABLET BY MOUTH TWICE DAILY AS NEEDED   insulin aspart protamine- aspart (70-30) 100 UNIT/ML injection Commonly known as: NOVOLOG MIX 70/30 Inject 8 Units into the skin 2 (two) times daily with a meal.   insulin degludec 100 UNIT/ML FlexTouch Pen Commonly known as: TRESIBA Inject 55 Units into the skin at bedtime.   insulin lispro 100 UNIT/ML  KiwkPen Commonly known as: HUMALOG Inject 8 Units into the skin 3 (three) times daily.   INSULIN SYRINGE 1CC/29G 29G X 1/2" 1 ML Misc USE AS DIRECTED   isosorbide mononitrate 30 MG 24 hr tablet Commonly known as: IMDUR Take 1 tablet (30 mg total) by mouth daily.   levothyroxine 50 MCG tablet Commonly known as: SYNTHROID Take 50 mcg by mouth daily.   magnesium oxide 400 MG tablet Commonly known as: MAG-OX Take 1 tablet (400 mg total) by mouth 2 (two) times daily.   metFORMIN 500 MG tablet Commonly known as: GLUCOPHAGE TAKE 1 TABLET(500 MG) BY MOUTH FOUR TIMES DAILY   methocarbamol 500 MG tablet Commonly known as: ROBAXIN Take 1 tablet (500 mg total) by mouth every 6 (six) hours as needed for muscle spasms.   metoprolol tartrate 25 MG tablet Commonly known as: LOPRESSOR Take 1 tablet (25 mg total) by mouth 2 (two) times daily.   nitroGLYCERIN 0.4 MG SL tablet Commonly known as: NITROSTAT Place 1 tablet (0.4 mg total) under the tongue every 5 (five) minutes as needed for chest pain.   omeprazole 20 MG capsule Commonly known as: PRILOSEC Take 1 capsule (20 mg total) by mouth 2 (two) times daily before a meal.   ONE TOUCH ULTRA TEST test strip Generic drug: glucose blood TEST THREE TIMES A DAY AS DIRECTED.   oxyCODONE 5 MG immediate release tablet Commonly known as: Oxy IR/ROXICODONE Take 1 tablet (5 mg total) by mouth every 3 (three) hours as needed for moderate pain ((score 4 to 6)).   sildenafil 20 MG tablet Commonly known as: REVATIO Take 1 tablet by mouth three times daily as needed   Viagra 100 MG tablet Generic drug: sildenafil TAKE 1 TABLET BY MOUTH EVERY DAY AS NEEDED   warfarin 5 MG tablet Commonly known as: COUMADIN Take as directed by the anticoagulation clinic. If you are unsure how to take this medication, talk to your nurse or doctor. Original instructions: TAKE 1 TABLET BY MOUTH EVERY DAY AS DIRECTED BY COUMADIN CLINIC   Zolpidem Tartrate 1.75 MG  Subl Place 1.75 mg under the tongue at bedtime as needed (When you have trouble falling back to sleep). Started by: Chesley Mires, MD       Past Surgical History:  He  has a past surgical history that includes Cardiac catheterization (08/03/2011); TEE without cardioversion (N/A, 01/28/2013); Ablation (01/29/2013); atrial fibrillation ablation (N/A, 01/29/2013); Colonoscopy with propofol (N/A, 02/02/2016); Lumbar laminectomy/decompression microdiscectomy (N/A, 06/07/2017); and Anterior cervical decomp/discectomy fusion (N/A, 04/18/2018).  Family History:  His family history includes Cancer  in his father; Diabetes in his son; Heart attack in his maternal uncle; Heart attack (age of onset: 29) in his father; Heart disease in his brother, mother, and paternal grandfather; Stroke in his mother.  Social History:  He  reports that he quit smoking about 11 years ago. His smoking use included cigarettes. He has a 52.50 pack-year smoking history. He has never used smokeless tobacco. He reports previous alcohol use. He reports that he does not use drugs.

## 2019-09-09 ENCOUNTER — Ambulatory Visit (INDEPENDENT_AMBULATORY_CARE_PROVIDER_SITE_OTHER): Payer: Medicare Other

## 2019-09-09 ENCOUNTER — Other Ambulatory Visit: Payer: Self-pay

## 2019-09-09 DIAGNOSIS — Z5181 Encounter for therapeutic drug level monitoring: Secondary | ICD-10-CM | POA: Diagnosis not present

## 2019-09-09 DIAGNOSIS — I4892 Unspecified atrial flutter: Secondary | ICD-10-CM | POA: Diagnosis not present

## 2019-09-09 DIAGNOSIS — I480 Paroxysmal atrial fibrillation: Secondary | ICD-10-CM | POA: Diagnosis not present

## 2019-09-09 LAB — POCT INR: INR: 2.1 (ref 2.0–3.0)

## 2019-09-09 NOTE — Patient Instructions (Signed)
Please continue warfarin dosage of 1/2 tablet every day EXCEPT 1 TABLET ON MONDAYS.  Recheck INR 6 weeks.

## 2019-10-23 ENCOUNTER — Other Ambulatory Visit: Payer: Self-pay

## 2019-10-23 ENCOUNTER — Ambulatory Visit (INDEPENDENT_AMBULATORY_CARE_PROVIDER_SITE_OTHER): Payer: 59

## 2019-10-23 DIAGNOSIS — I480 Paroxysmal atrial fibrillation: Secondary | ICD-10-CM | POA: Diagnosis not present

## 2019-10-23 DIAGNOSIS — I4892 Unspecified atrial flutter: Secondary | ICD-10-CM

## 2019-10-23 DIAGNOSIS — Z5181 Encounter for therapeutic drug level monitoring: Secondary | ICD-10-CM

## 2019-10-23 LAB — POCT INR: INR: 1.9 — AB (ref 2.0–3.0)

## 2019-10-23 NOTE — Patient Instructions (Signed)
-   take 1 whole warfarin tablet tonight - then continue warfarin dosage of 1/2 tablet every day EXCEPT 1 TABLET ON MONDAYS.  - recheck INR 6 weeks.

## 2019-11-20 ENCOUNTER — Other Ambulatory Visit: Payer: Self-pay

## 2019-11-20 ENCOUNTER — Encounter: Payer: Self-pay | Admitting: Family

## 2019-11-20 ENCOUNTER — Telehealth: Payer: Self-pay | Admitting: Cardiovascular Disease

## 2019-11-20 ENCOUNTER — Ambulatory Visit (INDEPENDENT_AMBULATORY_CARE_PROVIDER_SITE_OTHER): Payer: 59 | Admitting: Family

## 2019-11-20 ENCOUNTER — Ambulatory Visit (INDEPENDENT_AMBULATORY_CARE_PROVIDER_SITE_OTHER): Payer: 59

## 2019-11-20 VITALS — BP 120/80 | HR 159 | Ht 71.0 in | Wt 248.4 lb

## 2019-11-20 DIAGNOSIS — Z5181 Encounter for therapeutic drug level monitoring: Secondary | ICD-10-CM

## 2019-11-20 DIAGNOSIS — I480 Paroxysmal atrial fibrillation: Secondary | ICD-10-CM

## 2019-11-20 DIAGNOSIS — I10 Essential (primary) hypertension: Secondary | ICD-10-CM | POA: Diagnosis not present

## 2019-11-20 DIAGNOSIS — I4892 Unspecified atrial flutter: Secondary | ICD-10-CM | POA: Diagnosis not present

## 2019-11-20 DIAGNOSIS — R600 Localized edema: Secondary | ICD-10-CM

## 2019-11-20 DIAGNOSIS — I25118 Atherosclerotic heart disease of native coronary artery with other forms of angina pectoris: Secondary | ICD-10-CM

## 2019-11-20 DIAGNOSIS — E039 Hypothyroidism, unspecified: Secondary | ICD-10-CM

## 2019-11-20 DIAGNOSIS — E785 Hyperlipidemia, unspecified: Secondary | ICD-10-CM

## 2019-11-20 LAB — POCT INR: INR: 3.3 — AB (ref 2.0–3.0)

## 2019-11-20 MED ORDER — METOPROLOL TARTRATE 25 MG PO TABS
50.0000 mg | ORAL_TABLET | Freq: Two times a day (BID) | ORAL | 3 refills | Status: DC
Start: 2019-11-20 — End: 2020-01-01

## 2019-11-20 MED ORDER — ISOSORBIDE MONONITRATE ER 30 MG PO TB24
30.0000 mg | ORAL_TABLET | Freq: Every day | ORAL | 3 refills | Status: DC
Start: 2019-11-20 — End: 2020-03-13

## 2019-11-20 MED ORDER — BENAZEPRIL HCL 40 MG PO TABS
40.0000 mg | ORAL_TABLET | Freq: Every day | ORAL | 3 refills | Status: DC
Start: 2019-11-20 — End: 2020-07-30

## 2019-11-20 MED ORDER — AMIODARONE HCL 200 MG PO TABS
200.0000 mg | ORAL_TABLET | Freq: Every day | ORAL | 3 refills | Status: DC
Start: 2019-11-20 — End: 2020-01-10

## 2019-11-20 MED ORDER — FUROSEMIDE 20 MG PO TABS
20.0000 mg | ORAL_TABLET | Freq: Two times a day (BID) | ORAL | 3 refills | Status: DC | PRN
Start: 2019-11-20 — End: 2020-03-18

## 2019-11-20 NOTE — Telephone Encounter (Signed)
Spoke with patient and he went into afib at 3 AM. He took amiodarone 1 whole tablet at 3 AM, 05:30 AM. and 08:30 AM. He also took Metoprolol at 3:00 AM and again at 9 AM. He reports he is still in afib and states that it has not lasted this long before. Scheduled him to come in today to see provider and he was agreeable with plan. He verbalized understanding of plan with no further questions at this time.

## 2019-11-20 NOTE — Patient Instructions (Signed)
-   skip warfarin tonight - then continue warfarin dosage of 1/2 tablet every day EXCEPT 1 TABLET ON MONDAYS.  - recheck INR 6 weeks.

## 2019-11-20 NOTE — Progress Notes (Signed)
Office Visit    Patient Name: TASHAWN LASWELL Date of Encounter: 11/20/2019  Primary Care Provider:  Sofie Hartigan, MD Primary Cardiologist:  [name], MD Electrophysiologist:  None   Chief Complaint    AZLAAN ISIDORE is a 64 y.o. male with a hx of CAD s/p NSTEMI 08/2011 recommended medical management, PAF/flutter s/p prior A. fib ablation 01/2013 by Dr. Rayann Heman, HTN, HLD, DM 2, OSA (diagnosed 2018), obesity, RBBB, GERD presents today for atrial fibrillation  Past Medical History    Past Medical History:  Diagnosis Date  . Atrial flutter ([place])   . Coronary artery disease 08/2011   a. NSTEMI 08/2011, LHC w/ severe disease of LCx with good collaterals, lesion was high-risk for intervention due to a steep angulation of the LCx coming from the left main coronary artery as well as heavy calcifications, discused at cath conference w/ consensus advising med Rx  . GERD (gastroesophageal reflux disease)   . Hyperlipidemia   . Hypertension   . Myocardial infarction ([place])   . Paroxysmal atrial fibrillation ([place])    a. s/p ablation 01/2013; b. prn amio/dilt; c. CHADS2VASc => 3 (HTN, DM, vascular disease); d. on Coumadin  . Poorly controlled diabetes mellitus ([place])   . RBBB   . Sleep apnea    Past Surgical History:  Procedure Laterality Date  . ABLATION  01/29/2013   PVI and CTI by Dr Rayann Heman for atrial flutter and paroxysmal atrial fibrillation  . ANTERIOR CERVICAL DECOMP/DISCECTOMY FUSION N/A 04/18/2018   Procedure: ANTERIOR CERVICAL DECOMPRESSION/DISCECTOMY FUSION 2 LEVELS C5-7;  Surgeon: Meade Maw, MD;  Location: [facility] ORS;  Service: Neurosurgery;  Laterality: N/A;  . ATRIAL FIBRILLATION ABLATION N/A 01/29/2013   Procedure: ATRIAL FIBRILLATION ABLATION;  Surgeon: Coralyn Mark, MD;  Location: [facility] CATH LAB;  Service: Cardiovascular;  Laterality: N/A;  . CARDIAC CATHETERIZATION  08/03/2011  . COLONOSCOPY WITH PROPOFOL N/A 02/02/2016   Procedure: COLONOSCOPY WITH  PROPOFOL;  Surgeon: Lucilla Lame, MD;  Location: [facility] ENDOSCOPY;  Service: Endoscopy;  Laterality: N/A;  . LUMBAR LAMINECTOMY/DECOMPRESSION MICRODISCECTOMY N/A 06/07/2017   Procedure: LUMBAR LAMINECTOMY/DECOMPRESSION MICRODISCECTOMY 2 LEVELS-L3-4,L4-5;  Surgeon: Meade Maw, MD;  Location: [facility] ORS;  Service: Neurosurgery;  Laterality: N/A;  . TEE WITHOUT CARDIOVERSION N/A 01/28/2013   Procedure: TRANSESOPHAGEAL ECHOCARDIOGRAM (TEE);  Surgeon: Lelon Perla, MD;  Location: [facility] ENDOSCOPY;  Service: Cardiovascular;  Laterality: N/A;    Allergies  No Known Allergies  History of Present Illness    LEMOINE GOYNE is a 64 y.o. male with a hx of CAD s/p NSTEMI 08/2011 recommended medical management, PAF/flutter s/p prior A. fib ablation 01/2013 by Dr. Rayann Heman, HTN, HLD, DM 2, OSA (diagnosed 2018), obesity, hypothyroidism, RBBB, GERD.  He was last seen 05/28/2019 by Dr. Rockey Situ.  Admitted to the hospital May 2013 with NSTEMI.  Underwent LHC showing severe disease in LCx with collaterals and normal LVEF.  LCx high risk for intervention due to steep angulation coming from LM coronary artery as well as heavy calcifications.  Discussed at cath conference and consensus of medical therapy.  No ischemic eval since that time.  Multiple episodes of atrial fib/flutter of the years with hospital admissions requiring cardioversion.  He has been continue on Coumadin for anticoagulation.  He has previously refused ILR for palpitations.   Monitor 10/2016 showed NSR average rate 54bpm with 4 runs of NSVT, 38 runs of SVT. He was seen by EP with recommendation to stop PRN amiodarone. He was recommended to continue to Metoprolol  for tachycardias and use extra 25mg  PRN for episodes of atrial fib.  Echo 11/2016 EF 55-60%, no RWMA, normal diastolic parameters, LA mildly dilated, PASP mildly elevated.   07/2017 due to recurrent arrhythmia he was started on Amiodarone 200mg  daily. Dose subsequently reduced to 100mg  daily  due to bradycardia and fatigue.   Seen in clinic 05/28/19 doing overall well. No changes at that time. Called office today noting recurrent episode of atrial fibrillation at 3 AM. He took a Amiodarone tablet at 3AM, 5:30AM, and 8:30AM. He additionally took Metoprolol at 3AM and 9AM.   Labs via Care Everywhere 11/04/2019:  TSH 0.044, A1c 9 Labs via care everywhere 10/04/2019:  Creatinine 1.4, GFR 51, AST 15, ALT 18, alkaline phosphatase 113, K4.5  Total cholesterol 111, triglycerides 83, LDL 56, HDL 38.3  His levothyroxine dose has been reduced by 50% due to has low TSH.  His EKG today does confirm that he is in recurrent atrial fibrillation.  He is very symptomatic palpitations, dyspnea, chest tightness.  Tells me this all started with the onset of atrial fibrillation and no symptoms prior to that event.  He has been taking his metoprolol tartrate 25mg  twice per day. He has been off of his Amiodarone 100mg  for about 6 months.  He self discontinued it he did not think he needed it anymore as his rhythm was well controlled.  We reviewed that amiodarone is a long-lasting medication and likely gradually took time to exit his system and this along with low TSH prompted recurrent atrial fibrillation.  Reports no excessive caffeine intake.  No alcohol use, tobacco use.  He has been compliant with his Coumadin and reports no bleeding complications.  Of note he and his wife have a beach trip planned for August 25-29.  EKGs/Labs/Other Studies Reviewed:   The following studies were reviewed today:   EKG:  EKG is ordered today.  The ekg ordered today demonstrates atrial fibrillation with RVR rate 159 bpm with [id number]   Recent Labs: No results found for requested labs within last 8760 hours.  Recent Lipid Panel    Component Value Date/Time   CHOL 162 01/13/2015 0854   CHOL 112 01/12/2013 0013   TRIG 62 01/13/2015 0854   TRIG 80 01/12/2013 0013   HDL 47 01/12/2013 0013   VLDL 12 01/13/2015  0854   VLDL 16 01/12/2013 0013   LDLCALC 49 01/12/2013 0013    Home Medications   Current Meds  Medication Sig  . benazepril (LOTENSIN) 40 MG tablet Take 1 tablet (40 mg total) by mouth daily.  . diclofenac Sodium (VOLTAREN) 1 % GEL Apply topically.  Marland Kitchen levothyroxine (SYNTHROID) 25 MCG tablet Take by mouth.  Marland Kitchen VIAGRA 100 MG tablet TAKE 1 TABLET BY MOUTH EVERY DAY AS NEEDED  . [DISCONTINUED] benazepril (LOTENSIN) 40 MG tablet Take 1 tablet (40 mg total) by mouth daily.    Review of Systems   Review of Systems  Constitutional: Negative for chills, fever and malaise/fatigue.  Cardiovascular: Positive for dyspnea on exertion and palpitations. Negative for chest pain, irregular heartbeat, leg swelling, near-syncope, orthopnea and syncope.  Respiratory: Positive for shortness of breath. Negative for cough and wheezing.   Gastrointestinal: Negative for melena, nausea and vomiting.  Genitourinary: Negative for hematuria.  Neurological: Negative for dizziness, light-headedness and weakness.   All other systems reviewed and are otherwise negative except as noted above.  Physical Exam    VS:  BP 120/80 (BP Location: Left Arm, Patient Position: Sitting, Cuff Size: Normal)  Pulse (!) 159   Ht 5\' 11"  (1.803 m)   Wt 248 lb 6.4 oz (112.7 kg)   SpO2 97%   BMI 34.64 kg/m  , BMI Body mass index is 34.64 kg/m. GEN: Well nourished, well developed, in no acute distress. HEENT: normal. Neck: Supple, no JVD, carotid bruits, or masses. Cardiac: Irregularly irregular and tachycardic, no murmurs, rubs, or gallops. No clubbing, cyanosis, edema.  Radials/DP/PT 2+ and equal bilaterally.  Respiratory:  Respirations regular and unlabored, clear to auscultation bilaterally. GI: Soft, nontender, nondistended, BS + x 4. MS: No deformity or atrophy. Skin: Warm and dry, no rash. Neuro:  Strength and sensation are intact. Psych: Normal affect.  Assessment & Plan    1. PAF/flutter/chronic anticoagulation  -EKG today shows recurrent atrial fibrillation with RVR rate 159 bpm.  He is not in any acute distress with normal oxygen level and blood pressure.  He will take an additional 25 mg of metoprolol upon returning home.  Increase metoprolol tartrate to 50 mg twice daily.  He has been off of his amiodarone for the last 6 months which likely is contributory to recurrent atrial fibrillation.  Resume amiodarone at 200 mg daily.  Will need to monitor carefully for bradycardia.  If he converts on his own to NSR he was instructed to reduce metoprolol tartrate to 25 mg twice daily and amiodarone to 100 mg daily.  We will call him Friday to check in on symptoms.  At that time we will evaluate whether there is need for cardioversion. We discussed the risks and benefits of cardioversion. Denies bleeding complications on warfarin and denies missed doses.  He was seen by Coumadin clinic RN today.  2. CAD -stable no anginal symptoms.  No indication for ischemic evaluation this time.  GDMT includes beta-blocker, statin, long-acting nitrate.  No aspirin secondary to chronic anticoagulation with warfarin.  3. Lower extremity edema - Continue PRN Lasix.  He has been taking it daily with good control of edema.  We reviewed recommendation for low-sodium,, elevating lower extremities when sitting.  Recent kidney function normal.  4. HTN - BP well controlled. Continue current antihypertensive regimen.  Refills provided of Lasix 20 mg, Imdur 30 mg, benazepril 40 mg.  Requested they purchase BP cuff for monitoring of BP/heart rate.  5. HLD, LDL less than 70 -10/2019 LDL 56.  Continue Zetia 10 mg daily.  Refill provided.  6. OSA-CPAP compliance encouraged.  7. High risk medication use/On amiodarone therapy -amiodarone resumed today after being off of it for 6 months.  Will need monitoring labs at least 4 weeks after being on the medication.  8. Hypothyroidism -recent TSH 0.04.  His levothyroxine dose was reduced by half by  endocrinology.  Possibly contributory to recurrent atrial fibrillation  9. DM2 -most recent A1c 9.  Encouraged to continue to follow with endocrinology.  Disposition: Phone call Friday for reassessment of HR and rhythm. If NSR restored, reduce medications as above. If NSR not restored, but symptoms improve - defer cardioversion until after his planned beach trip. If NSR not restored and symptoms continue, plan for cardioversion 11/26/19. Will wait to schedule until phone conversation Friday.   Loel Dubonnet, NP 11/20/2019, 4:10 PM

## 2019-11-20 NOTE — Patient Instructions (Addendum)
Medication Instructions:  Your physician has recommended you make the following change in your medication:   [place] Take an extra 25mg  tablet of Metoprolol   CHANGE Metoprolol to 50mg  (twice daily) -If we convert to sinus rhythm, go back to 25mg  (twice daily)  START Amiodarone 200mg  daily IF you convert to normal sinus rhythm CHANGE Amiodarone 100mg  daily  *If you need a refill on your cardiac medications before your next appointment, please call your pharmacy*   Lab Work: No lab work today.  Testing/Procedures: Your EKG today shows atrial fibrillation.  Follow-Up: At [facility], you and your health needs are our priority.  As part of our continuing mission to provide you with exceptional heart care, we have created designated Provider Care Teams.  These Care Teams include your primary Cardiologist (physician) and Advanced Practice Providers (APPs -  Physician Assistants and Nurse Practitioners) who all work together to provide you with the care you need, when you need it.  We recommend signing up for the patient portal called "MyChart".  Sign up information is provided on this After Visit Summary.  MyChart is used to connect with patients for Virtual Visits (Telemedicine).  Patients are able to view lab/test results, encounter notes, upcoming appointments, etc.  Non-urgent messages can be sent to your provider as well.   To learn more about what you can do with MyChart, go to [email or web address].    Your next appointment:   To be determined based on phone call Friday   Other Instructions

## 2019-11-20 NOTE — Telephone Encounter (Signed)
Thanks for heads up.   Loel Dubonnet, NP

## 2019-11-20 NOTE — Telephone Encounter (Signed)
Patient woke up at 3 a  Patient c/o Palpitations:  High priority if patient c/o lightheadedness, shortness of breath, or chest pain  1) How long have you had palpitations/irregular HR/ Afib? Are you having the symptoms now? A fib, started at 3 am  2) Are you currently experiencing lightheadedness, SOB or CP? no  3) Do you have a history of afib (atrial fibrillation) or irregular heart rhythm? yes  4) Have you checked your BP or HR? (document readings if available):   n/a  5) Are you experiencing any other symptoms? No. Patient states he has this every now and then and typically is last 3 hours. This time it has been for 10 hours. Patient has taken medication as prescribed

## 2019-11-22 ENCOUNTER — Telehealth: Payer: Self-pay | Admitting: Family

## 2019-11-22 ENCOUNTER — Other Ambulatory Visit: Payer: Self-pay

## 2019-11-22 DIAGNOSIS — I480 Paroxysmal atrial fibrillation: Secondary | ICD-10-CM

## 2019-11-22 NOTE — Telephone Encounter (Signed)
Tells me the last few days were "rough" but he feels "okay" today. He purchased a pulse oximeter. It is showing that his heart rate has been labile. 60bpm - 130bpm. HR today in the 70s and 90s. His heart rate wakes him up around 4 or 5 in the morning. He takes his medicine and it "eases off". His oxygen levels have been good per his report. He's still feeling fatigued. He isn't quite sure whether he is still in atrial fibrillation or if he is in normal sinus rhythm.Thinks he might have converted yesterday.   Scheduled for EKG in [place] at [facility] Monday with assistance of Lattie Haw, [place]. Appreciative of assistance. Informed to arrive at 9:45 AM.   Loel Dubonnet, NP

## 2019-11-25 ENCOUNTER — Ambulatory Visit
Admission: RE | Admit: 2019-11-25 | Discharge: 2019-11-25 | Disposition: A | Discharge status: Home / Self Care | Payer: 59 | Source: Ambulatory Visit | Attending: Cardiovascular Disease | Admitting: Cardiovascular Disease

## 2019-11-25 ENCOUNTER — Other Ambulatory Visit: Payer: Self-pay

## 2019-11-25 ENCOUNTER — Telehealth: Payer: Self-pay | Admitting: Family

## 2019-11-25 DIAGNOSIS — I480 Paroxysmal atrial fibrillation: Secondary | ICD-10-CM

## 2019-11-25 NOTE — Telephone Encounter (Signed)
Douglas Hunter was seen in clinic 11/20/19 with recurrent atrial fibrillation 159 bpm. He had self discontinued his Amiodarone approximately 6 months prior. He had an EKG this morning in short stay as recommended which showed atrial fibrillation with known RBBB rate 102 bpm. He is presently taking Metoprolol 50mg  twice daily and Amiodarone 200mg  daily.   Spoke with him on the phone. He reports feeling overall well. He notes his heart rate is staying 70-100 bpm most often up around 100 bpm. Tells me he does not feel "that bad ". Does notice palpitations particularly in the evening. We discussed that he could take an extra half tablet of his metoprolol in the evening as needed for palpitations.  As he has not self converted despite addition of amiodarone, plan for cardioversion with Dr. Rockey Situ next week. He is leaving town 11/27/19 - 12/01/19 and we will schedule cardioversion for after his trip. Risks and benefits of the planned procedure were reviewed at clinic visit 11/20/19 as well as via phone today and he is agreeable to proceed. He is appropriately anticoagulated with Warfarin.   Will route to nursing triage team for assistance in scheduling.   Loel Dubonnet, NP

## 2019-11-26 NOTE — Addendum Note (Signed)
Addended by: Verlon Au on: 11/26/2019 10:21 AM   Modules accepted: Orders

## 2019-11-26 NOTE — Telephone Encounter (Signed)
Instructions reviewed verbally and sent to patient via mychart.  Pt verbalized understanding and all questions were answered.   You are scheduled for a Cardioversion on 9/2 with Dr. Rockey Situ.  Please arrive at the [place] [facility] at 0630 am/pm. (1 hour prior to procedure unless lab work is needed; if lab work is needed arrive 1.5 hours ahead)  DIET: Nothing to eat or drink after midnight except a sip of water with medications (see medication instructions below)  Medication Instructions: Hold (fluid pill/DM meds) Lasix  Take Metoprolol  Continue your anticoagulant: Warfarin You will need to continue your anticoagulant after your procedure until you are told by your provider that it is safe to stop.   Labs:  1- Your physician recommends that you return for lab work on 8/30 at the medical mall. No appt is needed. Hours are M-F 7AM- 6 PM.   2- CV19 Pre admit testing DRIVE THRU  Please report to the PAT testing site (medical arts building) on ____8/30_____ date ______8 AM-1 PM_____ time for your DRIVE [facility] covid testing that is required prior to your procedure.  Following covid testing, please remain in quarantine. If you must be around others, please wash hands, avoid touching face and wear your mask.    You must have a responsible person to drive you home and stay in the waiting area during your procedure. Failure to do so could result in cancellation.  Bring your insurance cards.  *Special Note: Every effort is made to have your procedure done on time. Occasionally there are emergencies that occur at the hospital that may cause delays. Please be patient if a delay does occur.

## 2019-12-02 ENCOUNTER — Telehealth: Payer: Self-pay | Admitting: Cardiovascular Disease

## 2019-12-02 DIAGNOSIS — I480 Paroxysmal atrial fibrillation: Secondary | ICD-10-CM

## 2019-12-02 NOTE — Telephone Encounter (Signed)
Would be best to get EKG to confirm. Call short stay to see if we can have him get an EKG scheduled either today or tomorrow? He was able to do that once before.   Douglas Dubonnet, NP

## 2019-12-02 NOTE — Telephone Encounter (Signed)
EKG scheduling information sent to patient via mychart. Order placed.

## 2019-12-02 NOTE — Telephone Encounter (Signed)
Patient calling States that his heart has been back in normal rhythm since Thursday Would like to know if he should still follow through with cardioversion Please call to discuss

## 2019-12-02 NOTE — Telephone Encounter (Signed)
Call to patient to discuss POC from Laurann Montana, NP. No answer. LMTCB.   EKG needed this afternoon or tomorrow.

## 2019-12-03 ENCOUNTER — Inpatient Hospital Stay: Admission: RE | Admit: 2019-12-03 | Payer: Medicare Other | Source: Ambulatory Visit

## 2019-12-03 ENCOUNTER — Ambulatory Visit
Admission: RE | Admit: 2019-12-03 | Discharge: 2019-12-03 | Disposition: A | Discharge status: Home / Self Care | Payer: 59 | Source: Ambulatory Visit | Attending: Family Medicine | Admitting: Family Medicine

## 2019-12-03 ENCOUNTER — Other Ambulatory Visit: Payer: Self-pay

## 2019-12-03 DIAGNOSIS — I480 Paroxysmal atrial fibrillation: Secondary | ICD-10-CM | POA: Insufficient documentation

## 2019-12-04 ENCOUNTER — Other Ambulatory Visit: Payer: Self-pay | Admitting: Cardiovascular Disease

## 2019-12-04 ENCOUNTER — Telehealth: Payer: Self-pay

## 2019-12-04 NOTE — Telephone Encounter (Signed)
-----   Message from Loel Dubonnet, NP sent at 12/03/2019  3:54 PM EDT ----- Douglas Hunter EKG this morning shows he has converted to normal sinus rhythm. Please cancel cardioversion. Continue Amiodarone 200mg  daily and Metoprolol 50mg  twice daily.   Please monitor HR at home. If his HR is consistently less than 60 bpm, recommend he call us as will will plan to reduce Metoprolol dose to 25mg  twice daily. Would recommend a follow up appointment in clinic in 1-2 weeks.   Loel Dubonnet, NP

## 2019-12-04 NOTE — Telephone Encounter (Signed)
Attempted to schedule no ans no vm  

## 2019-12-04 NOTE — Telephone Encounter (Signed)
Refill request

## 2019-12-04 NOTE — Telephone Encounter (Signed)
Call to patient to review EKG results.    Pt verbalized understanding and has no further questions at this time. he agrees to POC.   Advised pt to call for any further questions or concerns.  No further orders.   Call to scheduling to make them aware we are cancelling DCCV tomorrow.   Routing to scheduling to make appt to be seen in clinic in 2 weeks.

## 2019-12-05 ENCOUNTER — Ambulatory Visit: Admission: RE | Admit: 2019-12-05 | Payer: 59 | Source: Home / Self Care | Admitting: Cardiovascular Disease

## 2019-12-05 ENCOUNTER — Encounter: Admission: RE | Payer: Self-pay | Source: Home / Self Care

## 2019-12-05 SURGERY — CARDIOVERSION
Anesthesia: General

## 2019-12-23 NOTE — Telephone Encounter (Signed)
Attempted to schedule.  LMOV to call office.  ° °

## 2020-01-01 ENCOUNTER — Encounter: Payer: Self-pay | Admitting: Family

## 2020-01-01 ENCOUNTER — Ambulatory Visit (INDEPENDENT_AMBULATORY_CARE_PROVIDER_SITE_OTHER): Payer: 59 | Admitting: Family

## 2020-01-01 ENCOUNTER — Ambulatory Visit (INDEPENDENT_AMBULATORY_CARE_PROVIDER_SITE_OTHER): Payer: 59

## 2020-01-01 ENCOUNTER — Other Ambulatory Visit: Payer: Self-pay

## 2020-01-01 VITALS — BP 140/62 | HR 53 | Ht 71.0 in | Wt 245.5 lb

## 2020-01-01 DIAGNOSIS — I480 Paroxysmal atrial fibrillation: Secondary | ICD-10-CM

## 2020-01-01 DIAGNOSIS — Z5181 Encounter for therapeutic drug level monitoring: Secondary | ICD-10-CM | POA: Diagnosis not present

## 2020-01-01 DIAGNOSIS — I10 Essential (primary) hypertension: Secondary | ICD-10-CM

## 2020-01-01 DIAGNOSIS — Z7901 Long term (current) use of anticoagulants: Secondary | ICD-10-CM

## 2020-01-01 DIAGNOSIS — E785 Hyperlipidemia, unspecified: Secondary | ICD-10-CM

## 2020-01-01 DIAGNOSIS — Z79899 Other long term (current) drug therapy: Secondary | ICD-10-CM | POA: Diagnosis not present

## 2020-01-01 DIAGNOSIS — I4892 Unspecified atrial flutter: Secondary | ICD-10-CM

## 2020-01-01 DIAGNOSIS — I25118 Atherosclerotic heart disease of native coronary artery with other forms of angina pectoris: Secondary | ICD-10-CM

## 2020-01-01 LAB — POCT INR: INR: 5.2 — AB (ref 2.0–3.0)

## 2020-01-01 MED ORDER — METOPROLOL TARTRATE 25 MG PO TABS: ORAL_TABLET | ORAL | 1 refills | Status: DC

## 2020-01-01 NOTE — Patient Instructions (Addendum)
Medication Instructions:  No medication changes today.   *If you need a refill on your cardiac medications before your next appointment, please call your pharmacy*  Lab Work: Your physician recommends that you have lab work today: CMP, TSH  If you have labs (blood work) drawn today and your tests are completely normal, you will receive your results only by:  [place] (if you have [place]) OR  A paper copy in the mail If you have any lab test that is abnormal or we need to change your treatment, we will call you to review the results.  Testing/Procedures: Your EKG today shows sinus bradycardia which is a stable finding.   Follow-Up: At [facility], you and your health needs are our priority.  As part of our continuing mission to provide you with exceptional heart care, we have created designated Provider Care Teams.  These Care Teams include your primary Cardiologist (physician) and Advanced Practice Providers (APPs -  Physician Assistants and Nurse Practitioners) who all work together to provide you with the care you need, when you need it.  We recommend signing up for the patient portal called "MyChart".  Sign up information is provided on this After Visit Summary.  MyChart is used to connect with patients for Virtual Visits (Telemedicine).  Patients are able to view lab/test results, encounter notes, upcoming appointments, etc.  Non-urgent messages can be sent to your provider as well.   To learn more about what you can do with MyChart, go to [email or web address].    Your next appointment:   6 month(s)  The format for your next appointment:   In Person  Provider:   You may see [name], MD or one of the following Advanced Practice Providers on your designated Care Team:   Murray Hodgkins, NP  Christell Faith, PA-C  Marrianne Mood, PA-C  Cadence Cando, [place]  Laurann Montana, NP  Other Instructions

## 2020-01-01 NOTE — Progress Notes (Signed)
Office Visit    Patient Name: Douglas Hunter Date of Encounter: 01/01/2020  Primary Care Provider:  Sofie Hartigan, MD Primary Cardiologist:  [name], MD Electrophysiologist:  None   Chief Complaint    Douglas Hunter is a 64 y.o. male with a hx of CAD s/p NSTEMI 08/2011 recommended medical management, PAF/flutter s/p prior A. fib ablation 01/2013 by Dr. Rayann Heman, HTN, HLD, DM 2, OSA (diagnosed 2018), obesity, RBBB, GERD presents today for follow-up of atrial fibrillation  Past Medical History    Past Medical History:  Diagnosis Date  . Atrial flutter ([place])   . Coronary artery disease 08/2011   a. NSTEMI 08/2011, LHC w/ severe disease of LCx with good collaterals, lesion was high-risk for intervention due to a steep angulation of the LCx coming from the left main coronary artery as well as heavy calcifications, discused at cath conference w/ consensus advising med Rx  . GERD (gastroesophageal reflux disease)   . Hyperlipidemia   . Hypertension   . Myocardial infarction ([place])   . Paroxysmal atrial fibrillation ([place])    a. s/p ablation 01/2013; b. prn amio/dilt; c. CHADS2VASc => 3 (HTN, DM, vascular disease); d. on Coumadin  . Poorly controlled diabetes mellitus ([place])   . RBBB   . Sleep apnea    Past Surgical History:  Procedure Laterality Date  . ABLATION  01/29/2013   PVI and CTI by Dr Rayann Heman for atrial flutter and paroxysmal atrial fibrillation  . ANTERIOR CERVICAL DECOMP/DISCECTOMY FUSION N/A 04/18/2018   Procedure: ANTERIOR CERVICAL DECOMPRESSION/DISCECTOMY FUSION 2 LEVELS C5-7;  Surgeon: Meade Maw, MD;  Location: [facility] ORS;  Service: Neurosurgery;  Laterality: N/A;  . ATRIAL FIBRILLATION ABLATION N/A 01/29/2013   Procedure: ATRIAL FIBRILLATION ABLATION;  Surgeon: Coralyn Mark, MD;  Location: [facility] CATH [facility];  Service: Cardiovascular;  Laterality: N/A;  . CARDIAC CATHETERIZATION  08/03/2011  . COLONOSCOPY WITH PROPOFOL N/A 02/02/2016   Procedure: COLONOSCOPY  WITH PROPOFOL;  Surgeon: Lucilla Lame, MD;  Location: [facility] ENDOSCOPY;  Service: Endoscopy;  Laterality: N/A;  . LUMBAR LAMINECTOMY/DECOMPRESSION MICRODISCECTOMY N/A 06/07/2017   Procedure: LUMBAR LAMINECTOMY/DECOMPRESSION MICRODISCECTOMY 2 LEVELS-L3-4,L4-5;  Surgeon: Meade Maw, MD;  Location: [facility] ORS;  Service: Neurosurgery;  Laterality: N/A;  . TEE WITHOUT CARDIOVERSION N/A 01/28/2013   Procedure: TRANSESOPHAGEAL ECHOCARDIOGRAM (TEE);  Surgeon: Lelon Perla, MD;  Location: [facility] ENDOSCOPY;  Service: Cardiovascular;  Laterality: N/A;    Allergies  No Known Allergies  History of Present Illness    Douglas Hunter is a 64 y.o. male with a hx of CAD s/p NSTEMI 08/2011 recommended medical management, PAF/flutter s/p prior A. fib ablation 01/2013 by Dr. Rayann Heman, HTN, HLD, DM 2, OSA (diagnosed 2018), obesity, hypothyroidism, RBBB, GERD.  He was last seen 11/20/19.   Admitted to the hospital May 2013 with NSTEMI.  Underwent LHC showing severe disease in LCx with collaterals and normal LVEF.  LCx high risk for intervention due to steep angulation coming from LM coronary artery as well as heavy calcifications.  Discussed at cath conference and consensus of medical therapy.  No ischemic eval since that time.  Multiple episodes of atrial fib/flutter of the years with hospital admissions requiring cardioversion.  He has been continue on Coumadin for anticoagulation.  He has previously refused ILR for palpitations.   Monitor 10/2016 showed NSR average rate 54bpm with 4 runs of NSVT, 38 runs of SVT. He was seen by EP with recommendation to stop PRN amiodarone. He was recommended to continue to Metoprolol for  tachycardias and use extra 25mg  PRN for episodes of atrial fib.  Echo 11/2016 EF 55-60%, no RWMA, normal diastolic parameters, LA mildly dilated, PASP mildly elevated.   07/2017 due to recurrent arrhythmia he was started on Amiodarone 200mg  daily. Dose subsequently reduced to 100mg  daily due to  bradycardia and fatigue.   Seen in clinic 05/28/19 doing overall well. No changes at that time. Called office 11/20/19 noting recurrent episode of atrial fibrillation at 3 AM. He took a Amiodarone tablet at 3AM, 5:30AM, and 8:30AM. He additionally took Metoprolol at 3AM and 9AM. He was symptomatic with palpitations, dyspnea, chest tightness. He was noted to be taking Metoprolol Tartrate 25mg  BID and off Amiodarone for 6 months.  He self discontinued it he did not think he needed it anymore as his rhythm was well controlled. He was set up for cardioversion, however he self converted and this was discontinued.   Labs via Care Everywhere 11/04/2019:  TSH 0.044, A1c 9 Labs via care everywhere 10/04/2019:  Creatinine 1.4, GFR 51, AST 15, ALT 18, alkaline phosphatase 113, K4.5  Total cholesterol 111, triglycerides 83, LDL 56, HDL 38.3  Reports one episode of atrial fibrillation which lasted 3 hours since last seen.  He took an extra 25 mg of metoprolol tartrate and it resolved.  He monitors his HR at home with readings around 60 bpm. Oxygen level at home has been 95%.  Reports no shortness of breath nor dyspnea on exertion. Reports no chest pain, pressure, or tightness. No edema, orthopnea, PND.  EKGs/Labs/Other Studies Reviewed:   The following studies were reviewed today:   EKG:  EKG is ordered today.  The ekg ordered today demonstrates SB 55 bpm with RBBB and no acute ST/T wave changes.   Recent Labs: No results found for requested labs within last 8760 hours.  Recent Lipid Panel    Component Value Date/Time   CHOL 162 01/13/2015 0854   CHOL 112 01/12/2013 0013   TRIG 62 01/13/2015 0854   TRIG 80 01/12/2013 0013   HDL 47 01/12/2013 0013   VLDL 12 01/13/2015 0854   VLDL 16 01/12/2013 0013   LDLCALC 49 01/12/2013 0013    Home Medications   Current Meds  Medication Sig  . acetaminophen (TYLENOL) 500 MG tablet Take 500 mg by mouth every 6 (six) hours as needed for pain.   Marland Kitchen amiodarone  (PACERONE) 200 MG tablet Take 1 tablet (200 mg total) by mouth daily.  Marland Kitchen atorvastatin (LIPITOR) 80 MG tablet Take 80 mg by mouth at bedtime.  . benazepril (LOTENSIN) 40 MG tablet Take 1 tablet (40 mg total) by mouth daily.  . cholecalciferol (VITAMIN D3) 25 MCG (1000 UNIT) tablet Take 1,000 Units by mouth daily.  Marland Kitchen ezetimibe (ZETIA) 10 MG tablet TAKE 1 TABLET(10 MG) BY MOUTH DAILY (Patient taking differently: Take 10 mg by mouth at bedtime. )  . furosemide (LASIX) 20 MG tablet Take 1 tablet (20 mg total) by mouth 2 (two) times daily as needed. (Patient taking differently: Take 40 mg by mouth daily. )  . insulin glargine (LANTUS) 100 UNIT/ML injection Inject 65 Units into the skin daily.  . insulin lispro (HUMALOG) 100 UNIT/ML KiwkPen Inject 10-16 Units into the skin 3 (three) times daily with meals.   . INSULIN SYRINGE 1CC/29G 29G X 1/2" 1 ML MISC USE AS DIRECTED  . isosorbide mononitrate (IMDUR) 30 MG 24 hr tablet Take 1 tablet (30 mg total) by mouth daily.  Marland Kitchen levothyroxine (SYNTHROID) 25 MCG tablet Take 25  mcg by mouth daily before breakfast.   . metoprolol tartrate (LOPRESSOR) 25 MG tablet Take one tablet twice daily. Take an additional tablet as needed for palpitations, atrial fibrillation.  . nitroGLYCERIN (NITROSTAT) 0.4 MG SL tablet Place 1 tablet (0.4 mg total) under the tongue every 5 (five) minutes as needed for chest pain.  . ONE TOUCH ULTRA TEST test strip TEST THREE TIMES A DAY AS DIRECTED.  Marland Kitchen VIAGRA 100 MG tablet TAKE 1 TABLET BY MOUTH EVERY DAY AS NEEDED (Patient taking differently: Take 100 mg by mouth as needed for erectile dysfunction. )  . warfarin (COUMADIN) 5 MG tablet TAKE 1 TABLET BY MOUTH EVERY DAY AS DIRECTED BY COUMADIN CLINIC  . [DISCONTINUED] metoprolol tartrate (LOPRESSOR) 25 MG tablet Take 2 tablets (50 mg total) by mouth 2 (two) times daily.    Review of Systems   Review of Systems  Constitutional: Negative for chills, fever and malaise/fatigue.  Cardiovascular:  Negative for chest pain, dyspnea on exertion, irregular heartbeat, leg swelling, near-syncope, orthopnea, palpitations and syncope.  Respiratory: Negative for cough, shortness of breath and wheezing.   Gastrointestinal: Negative for melena, nausea and vomiting.  Genitourinary: Negative for hematuria.  Neurological: Negative for dizziness, light-headedness and weakness.   All other systems reviewed and are otherwise negative except as noted above.  Physical Exam   VS:  BP 140/62 (BP Location: Left Arm, Patient Position: Sitting, Cuff Size: Normal)   Pulse (!) 53   Ht 5\' 11"  (1.803 m)   Wt 245 lb 8 oz (111.4 kg)   SpO2 98%   BMI 34.24 kg/m  , BMI Body mass index is 34.24 kg/m. GEN: Well nourished, well developed, in no acute distress. HEENT: normal. Neck: Supple, no JVD, carotid bruits, or masses. Cardiac: RRR, no murmurs, rubs, or gallops. No clubbing, cyanosis, edema.  Radials/DP/PT 2+ and equal bilaterally.  Respiratory:  Respirations regular and unlabored, clear to auscultation bilaterally. GI: Soft, nontender, nondistended, BS + x 4. MS: No deformity or atrophy. Skin: Warm and dry, no rash. Neuro:  Strength and sensation are intact. Psych: Normal affect.  Assessment & Plan    1. PAF/flutter/chronic anticoagulation -recurrent atrial fibrillation 11/20/2019 in the setting of being off his amiodarone.  His amiodarone was resumed and he converted.  EKG today shows sinus bradycardia 55 bpm with known right bundle branch block.  Heart rates at home routinely in the 60s.  Reports no heart rate in the 40s at home.  Continue metoprolol tartrate 25 mg twice daily.  Continue amiodarone 200 mg daily.  Continue warfarin.  Denies bleeding complications.  As his present bradycardia is asymptomatic and no noted bradycardic rates at home, will continue present doses.  If symptomatic or significant bradycardia consider reduced dose of amiodarone to 100 mg daily.    2. CAD -Stable no anginal  symptoms.  No indication for ischemic evaluation this time.  GDMT includes beta-blocker, statin, long-acting nitrate.  No aspirin secondary to chronic anticoagulation with warfarin.  3. Lower extremity edema -well-controlled on present Lasix dose.  He is prescribed Lasix 20 mg twice daily as needed but takes daily with good control.  Continue PRN Lasix.  He has been taking it daily with good control of edema.  Continue low-sodium diet, elevating lower extremities when sitting. .  4. HTN - BP mildly elevated today though well controlled on previous office visits.  Continue current antihypertensive regimen.   Requested purchase BP cuff for monitoring of BP.  5. HLD, LDL less than 70 -10/2019  LDL 56.  Continue Zetia 10 mg daily.    6. OSA-CPAP compliance encouraged.  7. High risk medication use/On amiodarone therapy -no signs of toxicity.  Maintaining normal sinus rhythm.  TSH, CMP today for monitoring.  Discussed need for annual eye exam.  8. Hypothyroidism -continue to follow with PCP/endocrinology.   9. DM2 -most recent A1c 9.  Encouraged to continue to follow with endocrinology.  Disposition: Follow up in 6 months with Dr. Rockey Situ or APP  Loel Dubonnet, NP 01/01/2020, 12:28 PM

## 2020-01-01 NOTE — Patient Instructions (Signed)
-   SKIP WARFARIN TONIGHT, TOMORROW & Friday - ON Saturday, START NEW DOSAGE warfarin dosage of 1/2 tablet every day - recheck INR 2 weeks

## 2020-01-02 LAB — COMPREHENSIVE METABOLIC PANEL
ALT: 23 IU/L (ref 0–44)
AST: 22 IU/L (ref 0–40)
Albumin/Globulin Ratio: 1.7 (ref 1.2–2.2)
Albumin: 4 g/dL (ref 3.8–4.8)
Alkaline Phosphatase: 132 IU/L — ABNORMAL HIGH (ref 44–121)
BUN/Creatinine Ratio: 23 (ref 10–24)
BUN: 27 mg/dL (ref 8–27)
Bilirubin Total: 1.1 mg/dL (ref 0.0–1.2)
CO2: 20 mmol/L (ref 20–29)
Calcium: 8.9 mg/dL (ref 8.6–10.2)
Chloride: 103 mmol/L (ref 96–106)
Creatinine, Ser: 1.16 mg/dL (ref 0.76–1.27)
GFR calc Af Amer: 76 mL/min/{1.73_m2} (ref >59)
GFR calc non Af Amer: 66 mL/min/{1.73_m2} (ref >59)
Globulin, Total: 2.4 g/dL (ref 1.5–4.5)
Glucose: 170 mg/dL — ABNORMAL HIGH (ref 65–99)
Potassium: 4.3 mmol/L (ref 3.5–5.2)
Sodium: 136 mmol/L (ref 134–144)
Total Protein: 6.4 g/dL (ref 6.0–8.5)

## 2020-01-02 LAB — TSH: TSH: <0.005 u[IU]/mL — ABNORMAL LOW (ref 0.450–4.500)

## 2020-01-03 ENCOUNTER — Telehealth: Payer: Self-pay

## 2020-01-03 LAB — T4, FREE: Free T4: 3.41 ng/dL — ABNORMAL HIGH (ref 0.82–1.77)

## 2020-01-03 LAB — SPECIMEN STATUS REPORT

## 2020-01-03 NOTE — Telephone Encounter (Signed)
-----   Message from Loel Dubonnet, NP sent at 01/03/2020  8:08 AM EDT ----- Regarding: Lab work 01/03/20 After low TSH collected, add on Free T4 was requested. [facility] was called 01/02/20, does not appear additional lab was able to be added on.   Can we please confirm by [facility] the additional labs were able to be added? If not, Mr. Pitta will need a thyroid panel (TSH, free T4, T3) at the [facility].   Thanks, Laurann Montana, NP

## 2020-01-03 NOTE — Telephone Encounter (Signed)
Jefm Bryant contacted [facility]. Update received form her in secure chat and copied here.   Sharia [employer] rep) said the TSH and Free T4 have been performed. She is unable to add the T3 through her system so she said she is going to fax a form to our office so we can complete it and have it added on. Will watch for the fax. :)

## 2020-01-05 LAB — T3: T3, Total: 189 ng/dL — ABNORMAL HIGH (ref 71–180)

## 2020-01-05 LAB — SPECIMEN STATUS REPORT

## 2020-01-06 ENCOUNTER — Telehealth: Payer: Self-pay | Admitting: Family

## 2020-01-06 DIAGNOSIS — E0590 Thyrotoxicosis, unspecified without thyrotoxic crisis or storm: Secondary | ICD-10-CM

## 2020-01-06 DIAGNOSIS — Z79899 Other long term (current) drug therapy: Secondary | ICD-10-CM

## 2020-01-06 DIAGNOSIS — I480 Paroxysmal atrial fibrillation: Secondary | ICD-10-CM

## 2020-01-06 MED ORDER — METHIMAZOLE 10 MG PO TABS: 20.0000 mg | ORAL_TABLET | Freq: Every day | ORAL | 3 refills | Status: DC

## 2020-01-06 NOTE — Telephone Encounter (Signed)
I spoke with the patient regarding his lab results.  He is aware of Douglas Hunter, NPs recommendations to: 1) try to avoid acetaminophen, alcohol, & fatty foods due to his elevated alkaline phosphatase 2) start methimazole 20 mg once daily due to hyperthyroidism 3) repeat TSH/ free t3/ free t4 in 4 weeks 4) referral to Dr. Quentin Ore to discuss possible alternative antiarrhythmic therapy  The patient voices understanding of the above recommendations and is agreeable with all of the above. He is aware I will: - send his RX to [facility] in [place] - place lab orders for the [place] to be done in 4 weeks>> he will mark this on his calendar - place a referral to Dr. Quentin Ore & scheduling will call to arrange.

## 2020-01-06 NOTE — Telephone Encounter (Signed)
Done and resulted on the patient.

## 2020-01-06 NOTE — Telephone Encounter (Signed)
Douglas Dubonnet, NP  01/02/2020 7:52 AM EDT     Normal kidney function and electolytes. AST/ALT (liver enzymes) normal with mild elevation of alkaline phosphatase consistent with some fatty liver. Recommend avoiding Acetaminophen, alcohol, fatty foods. TSH is low suggestive of overactive thyroid - can we please request [facility] add on a Free T4? This could be an effect of Amiodarone. Continue current medications until additional lab work obtained.   Douglas Dubonnet, NP  01/06/2020 8:04 AM EDT Back to Top    Douglas Hunter's lab work shows hyperthyroidism (over active thyroid). This can happen with usage of Amiodarone. Recommend starting Methimazole 20mg  daily with repeat thyroid panel (TSH/free T4/T3) in 4 weeks. Recommend referral to EP (Dr. Quentin Ore) for discussion of alternate anti-arrhythmic agent.

## 2020-01-07 ENCOUNTER — Telehealth: Payer: Self-pay | Admitting: Family

## 2020-01-07 NOTE — Telephone Encounter (Signed)
Patient states Douglas Hammers, NP prescribed a thyroid medication. Please call to discuss if he should discontinue his current prescription for his thyroid.

## 2020-01-07 NOTE — Telephone Encounter (Signed)
Spoke with patient and clarified that he is to be taking both medications.his Levothyroxine and the new prescription Methimazole.  Patient was grateful for the call back.

## 2020-01-10 ENCOUNTER — Telehealth: Payer: Self-pay | Admitting: Family

## 2020-01-10 DIAGNOSIS — I480 Paroxysmal atrial fibrillation: Secondary | ICD-10-CM

## 2020-01-10 NOTE — Telephone Encounter (Signed)
-----   Message from Vickie Epley, MD sent at 01/10/2020 12:57 PM EDT ----- Regarding: RE: Recommendations? Douglas Hunter,  Lets stop the amiodarone and levothyroxine. Continue the methimazole. Lets also update the echocardiogram for him.  Jeneen Rinks, you last saw him in 2016. Do you want to set up a follow up with you or do you want me to see him in [place]. Either way is fine with me.  Tracie Harrier  ----- Message ----- From: Douglas Dubonnet, NP Sent: 01/10/2020   7:52 AM EDT To: Vickie Epley, MD Subject: Recommendations?                               Hi Dr. Quentin Ore,  [name] is coming to see you 01/29/20 but I wanted to get some input whether there was anything you wanted me to do prior to him seeing you. Below is a brief HPI:  Hx of PAF/flutter with hospital admissions and cardioversion. Anticoagulated on Warfarin. 07/2017 started on Amiodarone 200mg  daily. Amio subsequently reduced to 100mg  due to bradycardia.  Seen 11/20/19 with recurrent atrial fib. Had self DC'd amio 6 mos prior. Amio 200mg  resumed.  Metoprolol tartrate increased to 50mg  BID due to tachycardia. He self converted without need for cardioversion.  Seen in f/u 01/01/20 maintaining NSR 55bpm (had reduced Metoprolol Tartrate to 25mg  BID). Bradycardia was overall asymptomatic. Collected labs for follow up.  01/01/20 TSH <0.005, T3 189, T4 3.41. 01/02/20 Started Methimazole 20mg  daily with repeat thyroid panel in 4 weeks. Referred to EP for consideration of alternate arrhythmic  My two main questions are...  1. Would you like me to continue his Amiodarone or discontinue? 2. He is also on levothyroxine 25mg  daily - should this be discontinued in the setting of hyperthyroidism?  Any assistance much appreciative! Best, Douglas Dubonnet, NP

## 2020-01-10 NOTE — Telephone Encounter (Signed)
Called Mr. Payer and left detailed voicemail per DPR with recommendations per Dr. Quentin Ore.  Recommendations detailed below.  Requested he call us back to confirm he understood the directions.   STOP Amiodarone  STOP Levothyroxin  CONTINUE Methimazole  He was also recommended for scheduling of echocardiogram due to atrial fibrillation to be performed prior to visit with EP.  Will route note to scheduling team to schedule echo, order placed.  Will route note to triage team as FYI to review recommended changes when he returns call.   Loel Dubonnet, NP

## 2020-01-13 NOTE — Telephone Encounter (Signed)
Thank you for helping get that set up!  Med changes made and echo ordered, continue with current plan.   Loel Dubonnet, NP

## 2020-01-13 NOTE — Telephone Encounter (Signed)
Scheduled for 10/13 for his echo

## 2020-01-13 NOTE — Telephone Encounter (Signed)
Spoke with the pt and he reports he did receive his message on Friday 01/10/20 and he verbalized understanding of his med changes ordered by Dr. Quentin Ore.   Pt is having his Echo on 01/15/20 and plans to have his Thyroid labs drawn at his appt with Dr. Quentin Ore at the [facility] office 01/29/20.   Pt will call back if he has any questions or problems.

## 2020-01-15 ENCOUNTER — Other Ambulatory Visit: Payer: Self-pay

## 2020-01-15 ENCOUNTER — Ambulatory Visit (INDEPENDENT_AMBULATORY_CARE_PROVIDER_SITE_OTHER): Payer: 59

## 2020-01-15 DIAGNOSIS — I4892 Unspecified atrial flutter: Secondary | ICD-10-CM

## 2020-01-15 DIAGNOSIS — I480 Paroxysmal atrial fibrillation: Secondary | ICD-10-CM | POA: Diagnosis not present

## 2020-01-15 DIAGNOSIS — Z5181 Encounter for therapeutic drug level monitoring: Secondary | ICD-10-CM | POA: Diagnosis not present

## 2020-01-15 LAB — ECHOCARDIOGRAM COMPLETE
AR max vel: 3.1 cm2
AV Area VTI: 3.19 cm2
AV Area mean vel: 3.36 cm2
AV Mean grad: 8 mmHg
AV Peak grad: 15.1 mmHg
Ao pk vel: 1.94 m/s
Area-P 1/2: 3.65 cm2
S' Lateral: 3.3 cm
Single Plane A4C EF: 59.4 %

## 2020-01-15 LAB — POCT INR: INR: 3 (ref 2.0–3.0)

## 2020-01-15 MED ORDER — PERFLUTREN LIPID MICROSPHERE
1.0000 mL | INTRAVENOUS | Status: AC | PRN
  Administered 2020-01-15: 2 mL via INTRAVENOUS

## 2020-01-15 NOTE — Patient Instructions (Signed)
-   continue warfarin dosage of 1/2 tablet every day - recheck INR 2 weeks at your appt w/ Dr. Quentin Ore @ 9:00

## 2020-01-29 ENCOUNTER — Encounter: Payer: Self-pay | Admitting: Cardiology

## 2020-01-29 ENCOUNTER — Other Ambulatory Visit: Payer: Self-pay

## 2020-01-29 ENCOUNTER — Ambulatory Visit (INDEPENDENT_AMBULATORY_CARE_PROVIDER_SITE_OTHER): Payer: 59 | Admitting: Cardiology

## 2020-01-29 ENCOUNTER — Ambulatory Visit (INDEPENDENT_AMBULATORY_CARE_PROVIDER_SITE_OTHER): Payer: 59

## 2020-01-29 VITALS — BP 140/78 | HR 54 | Ht 71.0 in | Wt 253.0 lb

## 2020-01-29 DIAGNOSIS — I4892 Unspecified atrial flutter: Secondary | ICD-10-CM | POA: Diagnosis not present

## 2020-01-29 DIAGNOSIS — G4733 Obstructive sleep apnea (adult) (pediatric): Secondary | ICD-10-CM | POA: Diagnosis not present

## 2020-01-29 DIAGNOSIS — Z5181 Encounter for therapeutic drug level monitoring: Secondary | ICD-10-CM

## 2020-01-29 DIAGNOSIS — Z23 Encounter for immunization: Secondary | ICD-10-CM | POA: Diagnosis not present

## 2020-01-29 DIAGNOSIS — I480 Paroxysmal atrial fibrillation: Secondary | ICD-10-CM

## 2020-01-29 LAB — POCT INR: INR: 2.1 (ref 2.0–3.0)

## 2020-01-29 NOTE — Progress Notes (Signed)
Electrophysiology Office Follow up Visit Note:    Date:  01/29/2020   ID:  Douglas Hunter, DOB 01-26-1956, MRN [id number]  PCP:  Sofie Hartigan, MD  [facility] Cardiologist:  [name], MD  [place] Electrophysiologist:  None    Interval History:    Douglas Hunter is a 64 y.o. male who presents for a follow up visit. They were last seen in clinic by Laurann Montana on January 01, 2020.  He tells me he has been off of his amiodarone for at least 1 month at this point.  He continues to have paroxysms of symptomatic atrial fibrillation.  They typically last several hours and resolve after he lays down and takes an extra dose of his metoprolol.  He did have one episode of atrial fibrillation that lasted for 5 days.  He was seen in clinic and scheduled for cardioversion but he converted back to sinus rhythm prior to the cardioversion date. He would like to avoid redo ablation if at all possible although he readily knowledges that if his symptoms became more frequent than they are now he would want to undergo a repeat ablation.     Past Medical History:  Diagnosis Date  . Atrial flutter ([place])   . Coronary artery disease 08/2011   a. NSTEMI 08/2011, LHC w/ severe disease of LCx with good collaterals, lesion was high-risk for intervention due to a steep angulation of the LCx coming from the left main coronary artery as well as heavy calcifications, discused at cath conference w/ consensus advising med Rx  . GERD (gastroesophageal reflux disease)   . Hyperlipidemia   . Hypertension   . Myocardial infarction ([place])   . Paroxysmal atrial fibrillation ([place])    a. s/p ablation 01/2013; b. prn amio/dilt; c. CHADS2VASc => 3 (HTN, DM, vascular disease); d. on Coumadin  . Poorly controlled diabetes mellitus ([place])   . RBBB   . Sleep apnea     Past Surgical History:  Procedure Laterality Date  . ABLATION  01/29/2013   PVI and CTI by Dr Rayann Heman for atrial flutter and paroxysmal  atrial fibrillation  . ANTERIOR CERVICAL DECOMP/DISCECTOMY FUSION N/A 04/18/2018   Procedure: ANTERIOR CERVICAL DECOMPRESSION/DISCECTOMY FUSION 2 LEVELS C5-7;  Surgeon: Meade Maw, MD;  Location: [facility] ORS;  Service: Neurosurgery;  Laterality: N/A;  . ATRIAL FIBRILLATION ABLATION N/A 01/29/2013   Procedure: ATRIAL FIBRILLATION ABLATION;  Surgeon: Coralyn Mark, MD;  Location: [place] CATH LAB;  Service: Cardiovascular;  Laterality: N/A;  . CARDIAC CATHETERIZATION  08/03/2011  . COLONOSCOPY WITH PROPOFOL N/A 02/02/2016   Procedure: COLONOSCOPY WITH PROPOFOL;  Surgeon: Lucilla Lame, MD;  Location: [facility] ENDOSCOPY;  Service: Endoscopy;  Laterality: N/A;  . LUMBAR LAMINECTOMY/DECOMPRESSION MICRODISCECTOMY N/A 06/07/2017   Procedure: LUMBAR LAMINECTOMY/DECOMPRESSION MICRODISCECTOMY 2 LEVELS-L3-4,L4-5;  Surgeon: Meade Maw, MD;  Location: [facility] ORS;  Service: Neurosurgery;  Laterality: N/A;  . TEE WITHOUT CARDIOVERSION N/A 01/28/2013   Procedure: TRANSESOPHAGEAL ECHOCARDIOGRAM (TEE);  Surgeon: Lelon Perla, MD;  Location: [facility] ENDOSCOPY;  Service: Cardiovascular;  Laterality: N/A;    Current Medications: Current Meds  Medication Sig  . acetaminophen (TYLENOL) 500 MG tablet Take 500 mg by mouth every 6 (six) hours as needed for pain.   Marland Kitchen atorvastatin (LIPITOR) 80 MG tablet Take 80 mg by mouth at bedtime.  . benazepril (LOTENSIN) 40 MG tablet Take 1 tablet (40 mg total) by mouth daily.  . cholecalciferol (VITAMIN D3) 25 MCG (1000 UNIT) tablet Take 1,000 Units by mouth daily.  Marland Kitchen  ezetimibe (ZETIA) 10 MG tablet TAKE 1 TABLET(10 MG) BY MOUTH DAILY  . furosemide (LASIX) 20 MG tablet Take 1 tablet (20 mg total) by mouth 2 (two) times daily as needed.  . insulin glargine (LANTUS) 100 UNIT/ML injection Inject 65 Units into the skin daily.  . insulin lispro (HUMALOG) 100 UNIT/ML KiwkPen Inject 10-16 Units into the skin 3 (three) times daily with meals.   . INSULIN SYRINGE 1CC/29G 29G X 1/2" 1 ML MISC  USE AS DIRECTED  . isosorbide mononitrate (IMDUR) 30 MG 24 hr tablet Take 1 tablet (30 mg total) by mouth daily.  . methimazole (TAPAZOLE) 10 MG tablet Take 2 tablets (20 mg total) by mouth daily.  . metoprolol tartrate (LOPRESSOR) 25 MG tablet Take one tablet twice daily. Take an additional tablet as needed for palpitations, atrial fibrillation.  . nitroGLYCERIN (NITROSTAT) 0.4 MG SL tablet Place 1 tablet (0.4 mg total) under the tongue every 5 (five) minutes as needed for chest pain.  . ONE TOUCH ULTRA TEST test strip TEST THREE TIMES A DAY AS DIRECTED.  Marland Kitchen VIAGRA 100 MG tablet TAKE 1 TABLET BY MOUTH EVERY DAY AS NEEDED  . warfarin (COUMADIN) 5 MG tablet TAKE 1 TABLET BY MOUTH EVERY DAY AS DIRECTED BY COUMADIN CLINIC     Allergies:   Patient has no known allergies.   Social History   Socioeconomic History  . Marital status: Married    Spouse name: Not on file  . Number of children: Not on file  . Years of education: Not on file  . Highest education level: Not on file  Occupational History  . Not on file  Tobacco Use  . Smoking status: Former Smoker    Packs/day: 1.50    Years: 35.00    Pack years: 52.50    Types: Cigarettes    Quit date: 01/16/2008    Years since quitting: 12.0  . Smokeless tobacco: Never Used  Vaping Use  . Vaping Use: Never used  Substance and Sexual Activity  . Alcohol use: Not Currently    Comment: occasional  . Drug use: No  . Sexual activity: Yes  Other Topics Concern  . 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 Difficulty of Paying Living Expenses: Not on file  Food Insecurity:   . Worried About Charity fundraiser in the Last Year: Not on file  . Ran Out of Food in the Last Year: Not on file  Transportation Needs:   . Lack of Transportation (Medical): Not on file    . Lack of Transportation (Non-Medical): Not on file  Physical Activity:   . Days of Exercise per Week: Not on file  . Minutes of Exercise per Session: Not on file  Stress:   . Feeling of Stress : Not on file  Social Connections:   . Frequency of Communication with Friends and Family: Not on file  . Frequency of Social Gatherings with Friends and Family: Not on file  . Attends Religious Services: Not on file  . Active Member of Clubs or Organizations: Not on file  . Attends Archivist Meetings: Not on file  . Marital Status: Not on file     Family History: The patient's family history includes Cancer in his father; Diabetes in his son;  Heart attack in his maternal uncle; Heart attack (age of onset: 23) in his father; Heart disease in his brother, mother, and paternal grandfather; Stroke in his mother.  ROS:   Please see the history of present illness.    All other systems reviewed and are negative.  EKGs/Labs/Other Studies Reviewed:    The following studies were reviewed today: Prior notes, ECGs  EKG:  The ekg ordered today demonstrates sinus rhythm with a right bundle branch block  Recent Labs: 01/01/2020: ALT 23; BUN 27; Creatinine, Ser 1.16; Potassium 4.3; Sodium 136; TSH <0.005  Recent Lipid Panel    Component Value Date/Time   CHOL 162 01/13/2015 0854   CHOL 112 01/12/2013 0013   TRIG 62 01/13/2015 0854   TRIG 80 01/12/2013 0013   HDL 47 01/12/2013 0013   VLDL 12 01/13/2015 0854   VLDL 16 01/12/2013 0013   LDLCALC 49 01/12/2013 0013    Physical Exam:    VS:  BP 140/78   Pulse (!) 54   Ht 5\' 11"  (1.803 m)   Wt 253 lb (114.8 kg)   SpO2 98%   BMI 35.29 kg/m     Wt Readings from Last 3 Encounters:  01/29/20 253 lb (114.8 kg)  01/01/20 245 lb 8 oz (111.4 kg)  11/20/19 248 lb 6.4 oz (112.7 kg)     GEN:  Well nourished, well developed in no acute distress.  Obese HEENT: Normal NECK: No JVD; No carotid bruits LYMPHATICS: No  lymphadenopathy CARDIAC: RRR, no murmurs, rubs, gallops RESPIRATORY:  Clear to auscultation without rales, wheezing or rhonchi  ABDOMEN: Soft, non-tender, non-distended MUSCULOSKELETAL:  No edema; No deformity  SKIN: Warm and dry NEUROLOGIC:  Alert and oriented x 3 PSYCHIATRIC:  Normal affect   ASSESSMENT:    1. Paroxysmal atrial fibrillation (HCC)   2. Need for immunization against influenza   3. OSA (obstructive sleep apnea)    PLAN:    In order of problems listed above:  1. Paroxysmal atrial fibrillation History of ablation in 2014.  Had a period of several years without atrial fibrillation but then has had recurrence.  The episodes are less frequent than before and less symptomatic.  He is off of amiodarone for at least 1 month.  He continues to take Coumadin and tolerates this without any bleeding issues.  He has a history of hypothyroidism and thus I would like to avoid amiodarone. I would like to add an antiarrhythmic to help control his episodes of symptomatic atrial fibrillation.  He is not a candidate for class Ic antiarrhythmics given history of coronary artery disease and a right bundle branch block on ECG.  We will plan on allowing the amiodarone to washout for 6-8 more weeks and then I will see him back in clinic to recheck his EKG.  If his intervals appear stable on the next ECG, we will plan to initiate dronedarone.  If this is ineffective, we will consider starting dofetilide. We will recheck thyroid labs and a chemistry panel.  2.  Patient needs flu shot today  3.  Obstructive sleep apnea Reports 100% adherence to CPAP therapy  Medication Adjustments/Labs and Tests Ordered: Current medicines are reviewed at length with the patient today.  Concerns regarding medicines are outlined above.  Orders Placed This Encounter  Procedures  . Flu Vaccine QUAD High Dose(Fluad)  . Basic Metabolic Panel (BMET)  . TSH  . T4, free  . EKG 12-Lead   No orders of the defined  types were placed  in this encounter.    Signed, Lars Mage, MD, [facility]  01/29/2020 9:26 AM    Electrophysiology [facility]

## 2020-01-29 NOTE — Patient Instructions (Addendum)
Medication Instructions:  Your physician recommends that you continue on your current medications as directed. Please refer to the Current Medication list given to you today. *If you need a refill on your cardiac medications before your next appointment, please call your pharmacy*  Lab Work: You will get lab work today:  BMP, TSH and free T4  If you have labs (blood work) drawn today and your tests are completely normal, you will receive your results only by: Marland Kitchen MyChart Message (if you have MyChart) OR . A paper copy in the mail If you have any lab test that is abnormal or we need to change your treatment, we will call you to review the results.  Testing/Procedures: None ordered.  Follow-Up: At [facility], you and your health needs are our priority.  As part of our continuing mission to provide you with exceptional heart care, we have created designated Provider Care Teams.  These Care Teams include your primary Cardiologist (physician) and Advanced Practice Providers (APPs -  Physician Assistants and Nurse Practitioners) who all work together to provide you with the care you need, when you need it.  Your next appointment:   6 weeks with Dr. Quentin Ore at the [facility] office  March 11, 2020 at 9:20 am

## 2020-01-29 NOTE — Patient Instructions (Signed)
-   continue warfarin dosage of 1/2 tablet every day - recheck INR in 6 weeks

## 2020-01-30 LAB — BASIC METABOLIC PANEL
BUN/Creatinine Ratio: 16 (ref 10–24)
BUN: 22 mg/dL (ref 8–27)
CO2: 21 mmol/L (ref 20–29)
Calcium: 9.2 mg/dL (ref 8.6–10.2)
Chloride: 102 mmol/L (ref 96–106)
Creatinine, Ser: 1.39 mg/dL — ABNORMAL HIGH (ref 0.76–1.27)
GFR calc Af Amer: 61 mL/min/{1.73_m2} (ref >59)
GFR calc non Af Amer: 53 mL/min/{1.73_m2} — ABNORMAL LOW (ref >59)
Glucose: 259 mg/dL — ABNORMAL HIGH (ref 65–99)
Potassium: 4.8 mmol/L (ref 3.5–5.2)
Sodium: 136 mmol/L (ref 134–144)

## 2020-01-30 LAB — TSH: TSH: <0.005 u[IU]/mL — ABNORMAL LOW (ref 0.450–4.500)

## 2020-01-30 LAB — T4, FREE: Free T4: 2.52 ng/dL — ABNORMAL HIGH (ref 0.82–1.77)

## 2020-02-05 ENCOUNTER — Telehealth: Payer: Self-pay | Admitting: Cardiology

## 2020-02-05 NOTE — Telephone Encounter (Signed)
Calling for lab results from last week

## 2020-02-05 NOTE — Telephone Encounter (Signed)
Returned call to pt.  Advised to continue current meds and will repeat lab work at next appt.  Forwarded last OV note and lab work to [employer] with [place] [facility].  Pt has follow up with them next week.

## 2020-02-08 ENCOUNTER — Other Ambulatory Visit: Payer: Self-pay | Admitting: Cardiovascular Disease

## 2020-02-10 NOTE — Telephone Encounter (Signed)
Refill request

## 2020-02-20 ENCOUNTER — Other Ambulatory Visit: Payer: Self-pay

## 2020-02-20 ENCOUNTER — Encounter: Payer: Self-pay | Admitting: Physician Assistant

## 2020-02-20 ENCOUNTER — Telehealth: Payer: Self-pay | Admitting: Physician Assistant

## 2020-02-20 ENCOUNTER — Ambulatory Visit (INDEPENDENT_AMBULATORY_CARE_PROVIDER_SITE_OTHER): Payer: 59 | Admitting: Physician Assistant

## 2020-02-20 VITALS — BP 120/70 | HR 63 | Ht 71.0 in | Wt 254.1 lb

## 2020-02-20 DIAGNOSIS — E785 Hyperlipidemia, unspecified: Secondary | ICD-10-CM

## 2020-02-20 DIAGNOSIS — I10 Essential (primary) hypertension: Secondary | ICD-10-CM

## 2020-02-20 DIAGNOSIS — I25118 Atherosclerotic heart disease of native coronary artery with other forms of angina pectoris: Secondary | ICD-10-CM

## 2020-02-20 DIAGNOSIS — R079 Chest pain, unspecified: Secondary | ICD-10-CM | POA: Diagnosis not present

## 2020-02-20 DIAGNOSIS — I480 Paroxysmal atrial fibrillation: Secondary | ICD-10-CM | POA: Diagnosis not present

## 2020-02-20 DIAGNOSIS — G4733 Obstructive sleep apnea (adult) (pediatric): Secondary | ICD-10-CM

## 2020-02-20 DIAGNOSIS — Z7901 Long term (current) use of anticoagulants: Secondary | ICD-10-CM

## 2020-02-20 DIAGNOSIS — E0590 Thyrotoxicosis, unspecified without thyrotoxic crisis or storm: Secondary | ICD-10-CM

## 2020-02-20 DIAGNOSIS — Z79899 Other long term (current) drug therapy: Secondary | ICD-10-CM

## 2020-02-20 MED ORDER — METHIMAZOLE 10 MG PO TABS
20.0000 mg | ORAL_TABLET | Freq: Two times a day (BID) | ORAL | 1 refills | Status: DC
Start: 2020-02-20 — End: 2020-09-23

## 2020-02-20 MED ORDER — METHIMAZOLE 10 MG PO TABS
40.0000 mg | ORAL_TABLET | Freq: Every day | ORAL | 1 refills | Status: DC
Start: 2020-02-20 — End: 2020-02-20

## 2020-02-20 NOTE — Telephone Encounter (Signed)
Patient returning call.

## 2020-02-20 NOTE — Progress Notes (Signed)
Office Visit    Patient Name: Douglas Hunter Date of Encounter: 02/20/2020  Primary Care Provider:  Sofie Hartigan, MD Primary Cardiologist:  [name], MD  Chief Complaint    Chief Complaint  Patient presents with  . other    pt. c/o having a-fib with rapid heart beats that started last Saturday morning but has recent stopped as of last night. Meds reviewed by the pt. verbally.     64 year old male with history of CAD s/p non-STEMI 08/2011 with recommendation for medical management, PAF/flutter s/p prior ablation 01/2013 by Dr. Rayann Heman, hypertension, hyperlipidemia, DM2, OSA (diagnosed 2018), obesity, RBBB, GERD, and who presents today for follow-up of atrial fibrillation.  Past Medical History    Past Medical History:  Diagnosis Date  . Atrial flutter ([place])   . Coronary artery disease 08/2011   a. NSTEMI 08/2011, LHC w/ severe disease of LCx with good collaterals, lesion was high-risk for intervention due to a steep angulation of the LCx coming from the left main coronary artery as well as heavy calcifications, discused at cath conference w/ consensus advising med Rx  . GERD (gastroesophageal reflux disease)   . Hyperlipidemia   . Hypertension   . Myocardial infarction ([place])   . Paroxysmal atrial fibrillation ([place])    a. s/p ablation 01/2013; b. prn amio/dilt; c. CHADS2VASc => 3 (HTN, DM, vascular disease); d. on Coumadin  . Poorly controlled diabetes mellitus ([place])   . RBBB   . Sleep apnea    Past Surgical History:  Procedure Laterality Date  . ABLATION  01/29/2013   PVI and CTI by Dr Rayann Heman for atrial flutter and paroxysmal atrial fibrillation  . ANTERIOR CERVICAL DECOMP/DISCECTOMY FUSION N/A 04/18/2018   Procedure: ANTERIOR CERVICAL DECOMPRESSION/DISCECTOMY FUSION 2 LEVELS C5-7;  Surgeon: Meade Maw, MD;  Location: [facility] ORS;  Service: Neurosurgery;  Laterality: N/A;  . ATRIAL FIBRILLATION ABLATION N/A 01/29/2013   Procedure: ATRIAL FIBRILLATION  ABLATION;  Surgeon: Coralyn Mark, MD;  Location: [facility];  Service: Cardiovascular;  Laterality: N/A;  . CARDIAC CATHETERIZATION  08/03/2011  . COLONOSCOPY WITH PROPOFOL N/A 02/02/2016   Procedure: COLONOSCOPY WITH PROPOFOL;  Surgeon: Lucilla Lame, MD;  Location: [facility] ENDOSCOPY;  Service: Endoscopy;  Laterality: N/A;  . LUMBAR LAMINECTOMY/DECOMPRESSION MICRODISCECTOMY N/A 06/07/2017   Procedure: LUMBAR LAMINECTOMY/DECOMPRESSION MICRODISCECTOMY 2 LEVELS-L3-4,L4-5;  Surgeon: Meade Maw, MD;  Location: [facility] ORS;  Service: Neurosurgery;  Laterality: N/A;  . TEE WITHOUT CARDIOVERSION N/A 01/28/2013   Procedure: TRANSESOPHAGEAL ECHOCARDIOGRAM (TEE);  Surgeon: Lelon Perla, MD;  Location: [facility] ENDOSCOPY;  Service: Cardiovascular;  Laterality: N/A;    Allergies  No Known Allergies  History of Present Illness    Douglas Hunter is a 64 y.o. male with PMH as above.  He was last seen in office by EP Dr. Quentin Ore.  He was admitted 08/2011 with non-STEMI.  LHC showed severe disease in his left circumflex with collaterals and normal LVEF.  Left circumflex high risk for intervention due to steep angulation coming from LM coronary artery as well as having heavy calcifications.  This was discussed at the cath conference with consensus for medical therapy.  No ischemic evaluation has been performed since that time.  He has had multiple episodes of atrial fibrillation/flutter over the years with hospital admissions requiring cardioversion.  He has been continued on Coumadin for anticoagulation.  He previously refused ILR for palpitations.  Monitor 10/2016 showed NSR with average rate 54 bpm, 4 runs of NSVT, 38 runs SVT.  He was seen by EP with recommendation to stop as needed amiodarone.  He was continued on metoprolol for tachycardia with extra 25 mg as needed for episodes of atrial fibrillation.  Echo 11/2016 with EF 55 to 60%, mild LAE, PASP mildly elevated.  He was seen 07/2017 for recurrent  arrhythmia and started on amiodarone 200 mg daily.  Dose was subsequently reduced to 100 mg daily due to bradycardia and fatigue.  He was seen in clinic 05/2019 and doing well from a cardiac standpoint.  No medication changes were made.  He called the office 11/2019 with recurrent episodes of atrial fibrillation at 3 AM.  He reported palpitations, dyspnea, and chest tightness.  He was taking his beta-blocker twice daily.  He had been off amiodarone for 6 months, as he had self discontinued it.  He was set up for cardioversion but self converted before that time.  Annual eye exam was discussed.  His A1c was elevated with encouragement that he follow-up with endocrinology.  He was seen 01/2020 by Dr. Quentin Ore.  He was off his of his amiodarone for 1 month.  He reported symptomatic episodes of atrial fibrillation that lasted several hours before resolving after laying down and taking an extra metoprolol.  He had one episode of atrial fibrillation that lasted 5 days.  He was interested in redo ablation if indicated.  It was noted that amiodarone should be avoided due to his history of hypothyroidism.  He was not a candidate for 1C antiarrhythmics, given his history of CAD and RBBB.  Plan was to have amiodarone washout for 6 to 8 weeks and follow-up in December.  At that time, EKG would be rechecked.  If his intervals were stable on EKG, dronedarone could be initiated.  If ineffective, dofetilide could be initiated.  He reported CPAP compliance.  Today, 02/20/2020, he returns to clinic and notes that he had an additional and prolonged episode of atrial fibrillation for the past 5 days.  He reports that he had trouble sleeping during this most recent run of atrial fibrillation, but that it was helped with his metoprolol.  He took 2 Lopressor 25 mg in the a.m. (50 mg total) and an additional Lopressor 50 mg total in the p.m.  Atrial fibrillation symptoms consisted of racing heart rate, palpitations, chest pain, and  shortness of breath.  Chest pain was described as similar to that of heartburn and not new for him, always occurring at the time of his atrial fibrillation.  CP was not clearly associated with exertion or rest, as well as food.  Since his last visit, he also reports swelling in his ankles and feet, particularly at night.  He states this is an ongoing issue and not necessarily increased from previous visits.  He is drinking at least 2 of 20 ounce bottles of water per day and 1 L of diet [place] per day.  Fluid and salt intake were discussed.  We also discussed his plan to follow-up with EP, as well as touch base with his PCP regarding his symptoms and EKG today.   He reports medication and CPAP compliance.  No reported signs or symptoms of bleeding.  Of note: During our visit, I was contacted by Laurann Montana, NP, who had also been contacted by Dr. Quentin Ore to increase his methimazole from 20 mg daily to 40 mg daily with repeat thyroid labs in 6 weeks, which was subsequently relayed to the patient.   Case discussed with office MD and primary cardiologist  same day.   Home Medications    Current Outpatient Medications on File Prior to Visit  Medication Sig Dispense Refill  . acetaminophen (TYLENOL) 500 MG tablet Take 500 mg by mouth every 6 (six) hours as needed for pain.     Marland Kitchen atorvastatin (LIPITOR) 80 MG tablet Take 80 mg by mouth at bedtime.    . benazepril (LOTENSIN) 40 MG tablet Take 1 tablet (40 mg total) by mouth daily. 30 tablet 3  . cholecalciferol (VITAMIN D3) 25 MCG (1000 UNIT) tablet Take 1,000 Units by mouth daily.    Marland Kitchen ezetimibe (ZETIA) 10 MG tablet TAKE 1 TABLET(10 MG) BY MOUTH DAILY 90 tablet 3  . furosemide (LASIX) 20 MG tablet Take 1 tablet (20 mg total) by mouth 2 (two) times daily as needed. 60 tablet 3  . insulin glargine (LANTUS) 100 UNIT/ML injection Inject 65 Units into the skin daily.    . insulin lispro (HUMALOG) 100 UNIT/ML KiwkPen Inject 10-16 Units into the skin 3  (three) times daily with meals.     . INSULIN SYRINGE 1CC/29G 29G X 1/2" 1 ML MISC USE AS DIRECTED 100 each PRN  . isosorbide mononitrate (IMDUR) 30 MG 24 hr tablet Take 1 tablet (30 mg total) by mouth daily. 30 tablet 3  . methimazole (TAPAZOLE) 10 MG tablet Take 2 tablets (20 mg total) by mouth daily. 60 tablet 3  . metoprolol tartrate (LOPRESSOR) 25 MG tablet Take one tablet twice daily. Take an additional tablet as needed for palpitations, atrial fibrillation. 180 tablet 1  . nitroGLYCERIN (NITROSTAT) 0.4 MG SL tablet Place 1 tablet (0.4 mg total) under the tongue every 5 (five) minutes as needed for chest pain. 25 tablet 3  . ONE TOUCH ULTRA TEST test strip TEST THREE TIMES A DAY AS DIRECTED. 100 each 12  . VIAGRA 100 MG tablet TAKE 1 TABLET BY MOUTH EVERY DAY AS NEEDED 10 tablet 0  . warfarin (COUMADIN) 5 MG tablet TAKE 1 TABLET BY MOUTH EVERY DAY AS DIRECTED BY COUMADIN CLINIC 30 tablet 1   No current facility-administered medications on file prior to visit.    Review of Systems    He reports racing heart rate, palpitations, shortness of breath, and chest pain associated with his atrial fibrillation episodes.  He reports swelling in the ankles and feet, worse at night. He denies pnd, orthopnea, n, v, dizziness, syncope, or early satiety.    All other systems reviewed and are otherwise negative except as noted above.  Physical Exam    VS:  BP 120/70 (BP Location: Left Arm, Patient Position: Sitting, Cuff Size: Normal)   Pulse 63   Ht 5\' 11"  (1.803 m)   Wt 254 lb 2 oz (115.3 kg)   SpO2 98%   BMI 35.44 kg/m  , BMI Body mass index is 35.44 kg/m. GEN: Obese male, in no acute distress.  Joined by his wife HEENT: normal. Neck: Supple, +JVD. No carotid bruits, or masses. Cardiac: RRR, no murmurs, rubs, or gallops. No clubbing, cyanosis. 2-3+ pitting edema.  Radials/DP/PT 2+ and equal bilaterally.  Respiratory:  Respirations regular and unlabored, clear to auscultation bilaterally. GI:  Soft, nontender, slightly distended, BS + x 4. MS: no deformity or atrophy. Skin: warm and dry, no rash. Neuro:  Strength and sensation are intact. Psych: Normal affect.  Accessory Clinical Findings    ECG personally reviewed by me today -NSR, RBBB, inferior lateral ischemia not seen on most recent EKG.  EKG was reviewed with Dr. Harrell Gave End  at the time of visit and his primary cardiologist, Dr. Rockey Situ, later on the same day.  VITALS Reviewed today   Temp Readings from Last 3 Encounters:  08/08/19 97.8 F (36.6 C) (Temporal)  05/28/19 99.7 F (37.6 C)  04/19/18 98 F (36.7 C) (Oral)   BP Readings from Last 3 Encounters:  02/20/20 120/70  01/29/20 140/78  01/01/20 140/62   Pulse Readings from Last 3 Encounters:  02/20/20 63  01/29/20 (!) 54  01/01/20 (!) 53    Wt Readings from Last 3 Encounters:  02/20/20 254 lb 2 oz (115.3 kg)  01/29/20 253 lb (114.8 kg)  01/01/20 245 lb 8 oz (111.4 kg)     LABS  reviewed today   Lab Results  Component Value Date   WBC 9.2 04/11/2018   HGB 13.1 04/11/2018   HCT 38.8 (L) 04/11/2018   MCV 94.9 04/11/2018   PLT 249 04/11/2018   Lab Results  Component Value Date   CREATININE 1.39 (H) 01/29/2020   BUN 22 01/29/2020   NA 136 01/29/2020   K 4.8 01/29/2020   CL 102 01/29/2020   CO2 21 01/29/2020   Lab Results  Component Value Date   ALT 23 01/01/2020   AST 22 01/01/2020   ALKPHOS 132 (H) 01/01/2020   BILITOT 1.1 01/01/2020   Lab Results  Component Value Date   CHOL 162 01/13/2015   HDL 47 01/12/2013   LDLCALC 49 01/12/2013   TRIG 62 01/13/2015    Lab Results  Component Value Date   HGBA1C 7.8 (H) 01/13/2015   Lab Results  Component Value Date   TSH <0.005 (L) 01/29/2020     STUDIES/PROCEDURES reviewed today   Echo 01/15/20 1. Left ventricular ejection fraction, by estimation, is 60 to 65%. The  left ventricle has normal function. The left ventricle has no regional  wall motion abnormalities. Left  ventricular diastolic parameters were  normal.  2. Right ventricular systolic function is normal. The right ventricular  size is mildly enlarged. There is severely elevated pulmonary artery  systolic pressure.  3. Left atrial size was moderately dilated.  4. The mitral valve is normal in structure. No evidence of mitral valve  regurgitation. No evidence of mitral stenosis.  5. The aortic valve is grossly normal. Aortic valve regurgitation is not  visualized. No aortic stenosis is present.  6. The inferior vena cava is normal in size with greater than 50%  respiratory variability, suggesting right atrial pressure of 3 mmHg.   Cardiac monitoring 10/26/2016 Normal sinus rhythm min HR of 40 bpm, max HR of 187 bpm, and avg HR of 54 bpm. 5 Ventricular Tachycardia runs occurred, the run with the fastest interval lasting 12 beats with a max rate of 160 bpm (avg 128 bpm) 38 Supraventricular Tachycardia runs occurred, the run with the fastest interval lasting 4 beats with a max rate of 187 bpm, the longest lasting 16 beats with an avg rate of 113 bpm.  Possible Accelerated Idioventricular Rhythm was present .  Isolated SVEs were rare (<1.0%), SVE Couplets were rare (<1.0%), and SVE Triplets were rare (<1.0%). Isolated VEs were rare (<1.0%, 3224), VE Couplets were rare (<1.0%, 15), and VE Triplets were rare (<1.0%, 4). Ventricular Trigeminy was present. Signed, Esmond Plants, MD, Ph.D  Labs via Care Everywhere 11/04/2019:  TSH 0.044, A1c 9 Labs via care everywhere 10/04/2019:  Creatinine 1.4, GFR 51, AST 15, ALT 18, alkaline phosphatase 113, K4.5  Total cholesterol 111, triglycerides 83, LDL 56, HDL 38.3  Assessment & Plan    PAF/flutter/chronic anticoagulation --Recurrent atrial fibrillation this last week with associated chest pain, shortness of breath, racing heart rate, and palpitations.   --Sx relief with metoprolol to 50 mg in the a.m. and 50 mg in the p.m.   --NSR today with  ischemic changes and recommendations per MD as below.   --Continue current metoprolol tartrate 25 mg twice daily for rate control.   --Continue warfarin for anticoagulation.  No reported s/sx of bleeding.   --Continue to follow-up with EP as scheduled in December 8. Per EP, not a candidate for class Ic antiarrhythmics given history of CAD and right bundle branch block on EKG. Currently undergoing a washout of amiodarone as outlined in EP note. See recommendations regarding his thyroid as below.  CAD with stable angina -- EKG today shows ischemic changes in the inferior lateral leads, which are new since his last office visit.  Given his EKG changes seen today, as well as his report of chest pain with his episode of atrial fibrillation, office EKG reviewed by office MD at the time of visit and subsequently Dr. Rockey Situ later same day.  Per Dr. Rockey Situ, recommendation is for cardiac catheterization if patient agreeable.  Further recommendations pending contact with patient and as documented in upcoming telephone note once discussed with patient.  Continue medical management with beta-blocker, statin, and long-acting nitrate.  Not on ASA secondary to warfarin.  Lower extremity edema --Volume up on exam.  Check a BMET and if renal function/electrolytes stable, will increase his Lasix for 2 to 3 days and then drop down to his usual Lasix 20 mg twice daily as needed.  Discussed fluid and salt intake.  Further recommendations pending repeat BMET.  HTN --BP well controlled today.  Continue current antihypertensive regimen.  Reviewed salt and fluid intake guidelines.  Hyperlipidemia, LDL goal less than 70 --10/2019 LDL 56.  Continue Zetia 10 mg daily.  OSA --Ongoing CPAP compliance encouraged.  Hyperthyroidism --Per EP recommendations provided today, will increase methimazole to 40 mg daily with repeat thyroid labs in 6 to 8 weeks.   --Recommend follow-up with endocrinology.  DM2 --Most recent A1c 9.   Follow-up with endocrinology.    Medication changes: Increase to methimazole 40mg  daily (per Dr. Quentin Ore) and lasix recommendations pending repeat labs. Labs ordered: BMET, CBC, magnesium, TSH/FT4 6-8 weeks. Studies / Imaging ordered: None. Future considerations: Possible catheterization.  Disposition: To be determined based on phone conversation with patient.    Arvil Chaco, PA-C 02/20/2020

## 2020-02-20 NOTE — H&P (View-Only) (Signed)
Office Visit    Patient Name: Douglas Hunter Date of Encounter: 02/20/2020  Primary Care Provider:  Sofie Hartigan, MD Primary Cardiologist:  Douglas Rogue, MD  Chief Complaint    Chief Complaint  Patient presents with  . other    pt. c/o having a-fib with rapid heart beats that started last Saturday morning but has recent stopped as of last night. Meds reviewed by the pt. verbally.     64 year old male with history of CAD s/p non-STEMI 08/2011 with recommendation for medical management, PAF/flutter s/p prior ablation 01/2013 by Dr. Rayann Hunter, hypertension, hyperlipidemia, DM2, OSA (diagnosed 2018), obesity, RBBB, GERD, and who presents today for follow-up of atrial fibrillation.  Past Medical History    Past Medical History:  Diagnosis Date  . Atrial flutter ([place])   . Coronary artery disease 08/2011   a. NSTEMI 08/2011, LHC w/ severe disease of LCx with good collaterals, lesion was high-risk for intervention due to a steep angulation of the LCx coming from the left main coronary artery as well as heavy calcifications, discused at cath conference w/ consensus advising med Rx  . GERD (gastroesophageal reflux disease)   . Hyperlipidemia   . Hypertension   . Myocardial infarction ([place])   . Paroxysmal atrial fibrillation ([place])    a. s/p ablation 01/2013; b. prn amio/dilt; c. CHADS2VASc => 3 (HTN, DM, vascular disease); d. on Coumadin  . Poorly controlled diabetes mellitus ([place])   . RBBB   . Sleep apnea    Past Surgical History:  Procedure Laterality Date  . ABLATION  01/29/2013   PVI and CTI by Dr Douglas Hunter for atrial flutter and paroxysmal atrial fibrillation  . ANTERIOR CERVICAL DECOMP/DISCECTOMY FUSION N/A 04/18/2018   Procedure: ANTERIOR CERVICAL DECOMPRESSION/DISCECTOMY FUSION 2 LEVELS C5-7;  Surgeon: Douglas Maw, MD;  Location: [facility] ORS;  Service: Neurosurgery;  Laterality: N/A;  . ATRIAL FIBRILLATION ABLATION N/A 01/29/2013   Procedure: ATRIAL FIBRILLATION  ABLATION;  Surgeon: Douglas Mark, MD;  Location: [facility] CATH LAB;  Service: Cardiovascular;  Laterality: N/A;  . CARDIAC CATHETERIZATION  08/03/2011  . COLONOSCOPY WITH PROPOFOL N/A 02/02/2016   Procedure: COLONOSCOPY WITH PROPOFOL;  Surgeon: Douglas Lame, MD;  Location: [facility] ENDOSCOPY;  Service: Endoscopy;  Laterality: N/A;  . LUMBAR LAMINECTOMY/DECOMPRESSION MICRODISCECTOMY N/A 06/07/2017   Procedure: LUMBAR LAMINECTOMY/DECOMPRESSION MICRODISCECTOMY 2 LEVELS-L3-4,L4-5;  Surgeon: Douglas Maw, MD;  Location: [facility] ORS;  Service: Neurosurgery;  Laterality: N/A;  . TEE WITHOUT CARDIOVERSION N/A 01/28/2013   Procedure: TRANSESOPHAGEAL ECHOCARDIOGRAM (TEE);  Surgeon: Douglas Perla, MD;  Location: [facility] ENDOSCOPY;  Service: Cardiovascular;  Laterality: N/A;    Allergies  No Known Allergies  History of Present Illness    Douglas Hunter is a 64 y.o. male with PMH as above.  He was last seen in office by EP Dr. Quentin Hunter.  He was admitted 08/2011 with non-STEMI.  LHC showed severe disease in his left circumflex with collaterals and normal LVEF.  Left circumflex high risk for intervention due to steep angulation coming from LM coronary artery as well as having heavy calcifications.  This was discussed at the cath conference with consensus for medical therapy.  No ischemic evaluation has been performed since that time.  He has had multiple episodes of atrial fibrillation/flutter over the years with hospital admissions requiring cardioversion.  He has been continued on Coumadin for anticoagulation.  He previously refused ILR for palpitations.  Monitor 10/2016 showed NSR with average rate 54 bpm, 4 runs of NSVT, 38 runs SVT.  He was seen by EP with recommendation to stop as needed amiodarone.  He was continued on metoprolol for tachycardia with extra 25 mg as needed for episodes of atrial fibrillation.  Echo 11/2016 with EF 55 to 60%, mild LAE, PASP mildly elevated.  He was seen 07/2017 for recurrent  arrhythmia and started on amiodarone 200 mg daily.  Dose was subsequently reduced to 100 mg daily due to bradycardia and fatigue.  He was seen in clinic 05/2019 and doing well from a cardiac standpoint.  No medication changes were made.  He called the office 11/2019 with recurrent episodes of atrial fibrillation at 3 AM.  He reported palpitations, dyspnea, and chest tightness.  He was taking his beta-blocker twice daily.  He had been off amiodarone for 6 months, as he had self discontinued it.  He was set up for cardioversion but self converted before that time.  Annual eye exam was discussed.  His A1c was elevated with encouragement that he follow-up with endocrinology.  He was seen 01/2020 by Dr. Quentin Hunter.  He was off his of his amiodarone for 1 month.  He reported symptomatic episodes of atrial fibrillation that lasted several hours before resolving after laying down and taking an extra metoprolol.  He had one episode of atrial fibrillation that lasted 5 days.  He was interested in redo ablation if indicated.  It was noted that amiodarone should be avoided due to his history of hypothyroidism.  He was not a candidate for 1C antiarrhythmics, given his history of CAD and RBBB.  Plan was to have amiodarone washout for 6 to 8 weeks and follow-up in December.  At that time, EKG would be rechecked.  If his intervals were stable on EKG, dronedarone could be initiated.  If ineffective, dofetilide could be initiated.  He reported CPAP compliance.  Today, 02/20/2020, he returns to clinic and notes that he had an additional and prolonged episode of atrial fibrillation for the past 5 days.  He reports that he had trouble sleeping during this most recent run of atrial fibrillation, but that it was helped with his metoprolol.  He took 2 Lopressor 25 mg in the a.m. (50 mg total) and an additional Lopressor 50 mg total in the p.m.  Atrial fibrillation symptoms consisted of racing heart rate, palpitations, chest pain, and  shortness of breath.  Chest pain was described as similar to that of heartburn and not new for him, always occurring at the time of his atrial fibrillation.  CP was not clearly associated with exertion or rest, as well as food.  Since his last visit, he also reports swelling in his ankles and feet, particularly at night.  He states this is an ongoing issue and not necessarily increased from previous visits.  He is drinking at least 2 of 20 ounce bottles of water per day and 1 L of diet Ruby per day.  Fluid and salt intake were discussed.  We also discussed his plan to follow-up with EP, as well as touch base with his PCP regarding his symptoms and EKG today.   He reports medication and CPAP compliance.  No reported signs or symptoms of bleeding.  Of note: During our visit, I was contacted by Laurann Montana, NP, who had also been contacted by Dr. Quentin Hunter to increase his methimazole from 20 mg daily to 40 mg daily with repeat thyroid labs in 6 weeks, which was subsequently relayed to the patient.   Case discussed with office MD and primary cardiologist  same day.   Home Medications    Current Outpatient Medications on File Prior to Visit  Medication Sig Dispense Refill  . acetaminophen (TYLENOL) 500 MG tablet Take 500 mg by mouth every 6 (six) hours as needed for pain.     Marland Kitchen atorvastatin (LIPITOR) 80 MG tablet Take 80 mg by mouth at bedtime.    . benazepril (LOTENSIN) 40 MG tablet Take 1 tablet (40 mg total) by mouth daily. 30 tablet 3  . cholecalciferol (VITAMIN D3) 25 MCG (1000 UNIT) tablet Take 1,000 Units by mouth daily.    Marland Kitchen ezetimibe (ZETIA) 10 MG tablet TAKE 1 TABLET(10 MG) BY MOUTH DAILY 90 tablet 3  . furosemide (LASIX) 20 MG tablet Take 1 tablet (20 mg total) by mouth 2 (two) times daily as needed. 60 tablet 3  . insulin glargine (LANTUS) 100 UNIT/ML injection Inject 65 Units into the skin daily.    . insulin lispro (HUMALOG) 100 UNIT/ML KiwkPen Inject 10-16 Units into the skin 3  (three) times daily with meals.     . INSULIN SYRINGE 1CC/29G 29G X 1/2" 1 ML MISC USE AS DIRECTED 100 each PRN  . isosorbide mononitrate (IMDUR) 30 MG 24 hr tablet Take 1 tablet (30 mg total) by mouth daily. 30 tablet 3  . methimazole (TAPAZOLE) 10 MG tablet Take 2 tablets (20 mg total) by mouth daily. 60 tablet 3  . metoprolol tartrate (LOPRESSOR) 25 MG tablet Take one tablet twice daily. Take an additional tablet as needed for palpitations, atrial fibrillation. 180 tablet 1  . nitroGLYCERIN (NITROSTAT) 0.4 MG SL tablet Place 1 tablet (0.4 mg total) under the tongue every 5 (five) minutes as needed for chest pain. 25 tablet 3  . ONE TOUCH ULTRA TEST test strip TEST THREE TIMES A DAY AS DIRECTED. 100 each 12  . VIAGRA 100 MG tablet TAKE 1 TABLET BY MOUTH EVERY DAY AS NEEDED 10 tablet 0  . warfarin (COUMADIN) 5 MG tablet TAKE 1 TABLET BY MOUTH EVERY DAY AS DIRECTED BY COUMADIN CLINIC 30 tablet 1   No current facility-administered medications on file prior to visit.    Review of Systems    He reports racing heart rate, palpitations, shortness of breath, and chest pain associated with his atrial fibrillation episodes.  He reports swelling in the ankles and feet, worse at night. He denies pnd, orthopnea, n, v, dizziness, syncope, or early satiety.    All other systems reviewed and are otherwise negative except as noted above.  Physical Exam    VS:  BP 120/70 (BP Location: Left Arm, Patient Position: Sitting, Cuff Size: Normal)   Pulse 63   Ht 5\' 11"  (1.803 m)   Wt 254 lb 2 oz (115.3 kg)   SpO2 98%   BMI 35.44 kg/m  , BMI Body mass index is 35.44 kg/m. GEN: Obese male, in no acute distress.  Joined by his wife HEENT: normal. Neck: Supple, +JVD. No carotid bruits, or masses. Cardiac: RRR, no murmurs, rubs, or gallops. No clubbing, cyanosis. 2-3+ pitting edema.  Radials/DP/PT 2+ and equal bilaterally.  Respiratory:  Respirations regular and unlabored, clear to auscultation bilaterally. GI:  Soft, nontender, slightly distended, BS + x 4. MS: no deformity or atrophy. Skin: warm and dry, no rash. Neuro:  Strength and sensation are intact. Psych: Normal affect.  Accessory Clinical Findings    ECG personally reviewed by me today -NSR, RBBB, inferior lateral ischemia not seen on most recent EKG.  EKG was reviewed with Dr. Harrell Gave End  at the time of visit and his primary cardiologist, Dr. Rockey Situ, later on the same day.  VITALS Reviewed today   Temp Readings from Last 3 Encounters:  08/08/19 97.8 F (36.6 C) (Temporal)  05/28/19 99.7 F (37.6 C)  04/19/18 98 F (36.7 C) (Oral)   BP Readings from Last 3 Encounters:  02/20/20 120/70  01/29/20 140/78  01/01/20 140/62   Pulse Readings from Last 3 Encounters:  02/20/20 63  01/29/20 (!) 54  01/01/20 (!) 53    Wt Readings from Last 3 Encounters:  02/20/20 254 lb 2 oz (115.3 kg)  01/29/20 253 lb (114.8 kg)  01/01/20 245 lb 8 oz (111.4 kg)     LABS  reviewed today   Lab Results  Component Value Date   WBC 9.2 04/11/2018   HGB 13.1 04/11/2018   HCT 38.8 (L) 04/11/2018   MCV 94.9 04/11/2018   PLT 249 04/11/2018   Lab Results  Component Value Date   CREATININE 1.39 (H) 01/29/2020   BUN 22 01/29/2020   NA 136 01/29/2020   K 4.8 01/29/2020   CL 102 01/29/2020   CO2 21 01/29/2020   Lab Results  Component Value Date   ALT 23 01/01/2020   AST 22 01/01/2020   ALKPHOS 132 (H) 01/01/2020   BILITOT 1.1 01/01/2020   Lab Results  Component Value Date   CHOL 162 01/13/2015   HDL 47 01/12/2013   LDLCALC 49 01/12/2013   TRIG 62 01/13/2015    Lab Results  Component Value Date   HGBA1C 7.8 (H) 01/13/2015   Lab Results  Component Value Date   TSH <0.005 (L) 01/29/2020     STUDIES/PROCEDURES reviewed today   Echo 01/15/20 1. Left ventricular ejection fraction, by estimation, is 60 to 65%. The  left ventricle has normal function. The left ventricle has no regional  wall motion abnormalities. Left  ventricular diastolic parameters were  normal.  2. Right ventricular systolic function is normal. The right ventricular  size is mildly enlarged. There is severely elevated pulmonary artery  systolic pressure.  3. Left atrial size was moderately dilated.  4. The mitral valve is normal in structure. No evidence of mitral valve  regurgitation. No evidence of mitral stenosis.  5. The aortic valve is grossly normal. Aortic valve regurgitation is not  visualized. No aortic stenosis is present.  6. The inferior vena cava is normal in size with greater than 50%  respiratory variability, suggesting right atrial pressure of 3 mmHg.   Cardiac monitoring 10/26/2016 Normal sinus rhythm min HR of 40 bpm, max HR of 187 bpm, and avg HR of 54 bpm. 5 Ventricular Tachycardia runs occurred, the run with the fastest interval lasting 12 beats with a max rate of 160 bpm (avg 128 bpm) 38 Supraventricular Tachycardia runs occurred, the run with the fastest interval lasting 4 beats with a max rate of 187 bpm, the longest lasting 16 beats with an avg rate of 113 bpm.  Possible Accelerated Idioventricular Rhythm was present .  Isolated SVEs were rare (<1.0%), SVE Couplets were rare (<1.0%), and SVE Triplets were rare (<1.0%). Isolated VEs were rare (<1.0%, 3224), VE Couplets were rare (<1.0%, 15), and VE Triplets were rare (<1.0%, 4). Ventricular Trigeminy was present. Signed, Esmond Plants, MD, Ph.D  Labs via Care Everywhere 11/04/2019:  TSH 0.044, A1c 9 Labs via care everywhere 10/04/2019:  Creatinine 1.4, GFR 51, AST 15, ALT 18, alkaline phosphatase 113, K4.5  Total cholesterol 111, triglycerides 83, LDL 56, HDL 38.3  Assessment & Plan    PAF/flutter/chronic anticoagulation --Recurrent atrial fibrillation this last week with associated chest pain, shortness of breath, racing heart rate, and palpitations.   --Sx relief with metoprolol to 50 mg in the a.m. and 50 mg in the p.m.   --NSR today with  ischemic changes and recommendations per MD as below.   --Continue current metoprolol tartrate 25 mg twice daily for rate control.   --Continue warfarin for anticoagulation.  No reported s/sx of bleeding.   --Continue to follow-up with EP as scheduled in December 8. Per EP, not a candidate for class Ic antiarrhythmics given history of CAD and right bundle branch block on EKG. Currently undergoing a washout of amiodarone as outlined in EP note. See recommendations regarding his thyroid as below.  CAD with stable angina -- EKG today shows ischemic changes in the inferior lateral leads, which are new since his last office visit.  Given his EKG changes seen today, as well as his report of chest pain with his episode of atrial fibrillation, office EKG reviewed by office MD at the time of visit and subsequently Dr. Rockey Situ later same day.  Per Dr. Rockey Situ, recommendation is for cardiac catheterization if patient agreeable.  Further recommendations pending contact with patient and as documented in upcoming telephone note once discussed with patient.  Continue medical management with beta-blocker, statin, and long-acting nitrate.  Not on ASA secondary to warfarin.  Lower extremity edema --Volume up on exam.  Check a BMET and if renal function/electrolytes stable, will increase his Lasix for 2 to 3 days and then drop down to his usual Lasix 20 mg twice daily as needed.  Discussed fluid and salt intake.  Further recommendations pending repeat BMET.  HTN --BP well controlled today.  Continue current antihypertensive regimen.  Reviewed salt and fluid intake guidelines.  Hyperlipidemia, LDL goal less than 70 --10/2019 LDL 56.  Continue Zetia 10 mg daily.  OSA --Ongoing CPAP compliance encouraged.  Hyperthyroidism --Per EP recommendations provided today, will increase methimazole to 40 mg daily with repeat thyroid labs in 6 to 8 weeks.   --Recommend follow-up with endocrinology.  DM2 --Most recent A1c 9.   Follow-up with endocrinology.    Medication changes: Increase to methimazole 40mg  daily (per Dr. Quentin Hunter) and lasix recommendations pending repeat labs. Labs ordered: BMET, CBC, magnesium, TSH/FT4 6-8 weeks. Studies / Imaging ordered: None. Future considerations: Possible catheterization.  Disposition: To be determined based on phone conversation with patient.    Arvil Chaco, PA-C 02/20/2020

## 2020-02-20 NOTE — Patient Instructions (Addendum)
Medication Instructions:  Your physician has recommended you make the following change in your medication:   INCREASE methimazole (TAPAZOLE)  To 40mg  DAILY. Take FOUR tablets ONCE DAILY.   *If you need a refill on your cardiac medications before your next appointment, please call your pharmacy*   Lab Work:  1) Your physician recommends that you have lab work TODAY: Cmet, CBC, Magnesium  2) Your physician recommends that you return for lab work in: 6 weeks for TSH and Free T4  -  Please go to the [facility]. You will check in at the front desk to the right as you walk into the atrium. Valet Parking is offered if needed. - No appointment needed. You may go any day between 7 am and 6 pm.    If you have labs (blood work) drawn today and your tests are completely normal, you will receive your results only by: Marland Kitchen MyChart Message (if you have MyChart) OR . A paper copy in the mail If you have any lab test that is abnormal or we need to change your treatment, we will call you to review the results.   Testing/Procedures: None ordered   Follow-Up: At [facility], you and your health needs are our priority.  As part of our continuing mission to provide you with exceptional heart care, we have created designated Provider Care Teams.  These Care Teams include your primary Cardiologist (physician) and Advanced Practice Providers (APPs -  Physician Assistants and Nurse Practitioners) who all work together to provide you with the care you need, when you need it.  We recommend signing up for the patient portal called "MyChart".  Sign up information is provided on this After Visit Summary.  MyChart is used to connect with patients for Virtual Visits (Telemedicine).  Patients are able to view lab/test results, encounter notes, upcoming appointments, etc.  Non-urgent messages can be sent to your provider as well.   To learn more about what you can do with MyChart, go to  [email or web address].    Your next appointment:   2 week(s)  The format for your next appointment:   In Person  Provider:   You may see [name], MD or one of the following Advanced Practice Providers on your designated Care Team:    Murray Hodgkins, NP  Christell Faith, PA-C  Marrianne Mood, PA-C  Cadence Kathlen Mody, [place]  Laurann Montana, NP    Other Instructions  1) Keep appointment with Dr. Quentin Ore already scheduled  2) If you have chest pain or shortness of breath go to the Emergency Room  3) Marrianne Mood, PA will discuss options for heart cath procedure with Dr. Rockey Situ and our office will be in contact with you.   4) Please follow up with Endocrinology.

## 2020-02-20 NOTE — Telephone Encounter (Signed)
Call placed to provide patient with recommendations from his primary cardiologist regarding today's office visit.    Dr. Rockey Situ recommends cardiac catheterization in December, if amenable.    Unable to reach Douglas Hunter via both cell phone and home phone numbers.  His work number unfortunately links directly to a business place.    We will try again later today.

## 2020-02-21 ENCOUNTER — Telehealth: Payer: Self-pay | Admitting: *Deleted

## 2020-02-21 ENCOUNTER — Telehealth: Payer: Self-pay | Admitting: Cardiology

## 2020-02-21 DIAGNOSIS — E875 Hyperkalemia: Secondary | ICD-10-CM

## 2020-02-21 LAB — CBC
Hematocrit: 38 % (ref 37.5–51.0)
Hemoglobin: 12.9 g/dL — ABNORMAL LOW (ref 13.0–17.7)
MCH: 30.4 pg (ref 26.6–33.0)
MCHC: 33.9 g/dL (ref 31.5–35.7)
MCV: 89 fL (ref 79–97)
Platelets: 269 10*3/uL (ref 150–450)
RBC: 4.25 x10E6/uL (ref 4.14–5.80)
RDW: 12.1 % (ref 11.6–15.4)
WBC: 7.8 10*3/uL (ref 3.4–10.8)

## 2020-02-21 LAB — COMPREHENSIVE METABOLIC PANEL
ALT: 21 IU/L (ref 0–44)
AST: 15 IU/L (ref 0–40)
Albumin/Globulin Ratio: 1.3 (ref 1.2–2.2)
Albumin: 3.9 g/dL (ref 3.8–4.8)
Alkaline Phosphatase: 139 IU/L — ABNORMAL HIGH (ref 44–121)
BUN/Creatinine Ratio: 17 (ref 10–24)
BUN: 27 mg/dL (ref 8–27)
Bilirubin Total: 0.9 mg/dL (ref 0.0–1.2)
CO2: 18 mmol/L — ABNORMAL LOW (ref 20–29)
Calcium: 8.7 mg/dL (ref 8.6–10.2)
Chloride: 106 mmol/L (ref 96–106)
Creatinine, Ser: 1.56 mg/dL — ABNORMAL HIGH (ref 0.76–1.27)
GFR calc Af Amer: 53 mL/min/{1.73_m2} — ABNORMAL LOW (ref >59)
GFR calc non Af Amer: 46 mL/min/{1.73_m2} — ABNORMAL LOW (ref >59)
Globulin, Total: 3.1 g/dL (ref 1.5–4.5)
Glucose: 261 mg/dL — ABNORMAL HIGH (ref 65–99)
Potassium: 5.5 mmol/L — ABNORMAL HIGH (ref 3.5–5.2)
Sodium: 138 mmol/L (ref 134–144)
Total Protein: 7 g/dL (ref 6.0–8.5)

## 2020-02-21 LAB — MAGNESIUM: Magnesium: 1.9 mg/dL (ref 1.6–2.3)

## 2020-02-21 NOTE — Telephone Encounter (Signed)
Called to discuss Dr. Donivan Scull recommendation for catheterization.  Consent performed over the telephone.  The risks [stroke (1 in 1000), death (1 in 10), kidney failure [usually temporary] (1 in 500), bleeding (1 in 200), allergic reaction [possibly serious] (1 in 200)], benefits (diagnostic support and management of coronary artery disease) and alternatives of a cardiac catheterization were discussed in detail with Douglas Hunter and he is willing to proceed.  Message sent to triage staff regarding scheduling, as well as Coumadin clinic regarding anticoagulation.

## 2020-02-21 NOTE — Telephone Encounter (Signed)
Patient is returning the call  

## 2020-02-21 NOTE — Telephone Encounter (Signed)
Patient returning a call from our office to discuss lab results.  

## 2020-02-21 NOTE — Telephone Encounter (Signed)
See other phone note from today.

## 2020-02-21 NOTE — Telephone Encounter (Signed)
The patient has been notified of the result and verbalized understanding.  All questions (if any) were answered. Darrell Jewel, RN 02/21/2020 11:28 AM    BMET ordered, patient aware.

## 2020-02-21 NOTE — Telephone Encounter (Signed)
See note below

## 2020-02-21 NOTE — Telephone Encounter (Signed)
-----   Message from Arvil Chaco, PA-C sent at 02/21/2020  8:13 AM EST ----- Please let [name] know that his labs showed a slight bump in his renal function and elevated blood sugar, even in the setting of non-fasting labs. Will Cc his PCP for further recommendations.   His potassium is on the high side, though this value may not be accurate in the setting of his elevated glucose. Let's have him repeat this BMET early next week when able to ensure his K does not continue to elevate and work with his PCP to get his glucose controlled.   His alkaline phosphatase is elevated as it has been in previous labs and for which I will include his PCP on this result note.   His hemoglobin is on the lower side, which can happen over time with elevated kidney function, and is not concerning for a bleed given his stable hematocrit.   His magnesium is stable.   Given these findings, I would recommend he hold off on any adjustment to his lasix and call the office if he notices any further increase in leg swelling, wt, or breathing difficulty.   I have tried to reach him regarding Dr. Donivan Scull recommendation for cardiac catheterization based on his clinic EKG yesterday. If able, please let me know when a good time to reach him might be today.

## 2020-02-24 ENCOUNTER — Telehealth: Payer: Self-pay | Admitting: *Deleted

## 2020-02-24 NOTE — Telephone Encounter (Signed)
Patient with diagnosis of a fib on warfarin for anticoagulation.    Procedure: cardiac catheterization   Date of procedure: TBD   CHA2DS2-VASc Score = 4  This indicates a 4.8% annual risk of stroke. The patient's score is based upon: CHF History: 0 HTN History: 1 Diabetes History: 1 Stroke History: 0 Vascular Disease History: 1 Age Score: 1 Gender Score: 0     CrCl 61 mL/min Platelet count 269K  Per office protocol, patient can hold warfarin for 5 days prior to procedure.    Patient will not need bridging with Lovenox (enoxaparin) around procedure.  If not bridging, patient should restart warfarin on the evening of procedure or day after, at discretion of procedure MD

## 2020-02-24 NOTE — Telephone Encounter (Signed)
Pt is scheduled for left heart cath with Dr. Rockey Situ @ [facility] Friday 03/06/20 @ 9:30AM ARRIVAL time is 8:30AM.  Called pt to review instructions below:   You are scheduled for a Cardiac Catheterization on Friday, December 3 with Dr. [name].  1. Please arrive at the Medical mall at [facility] at 8:30 AM (This time is one hour before your procedure to ensure your preparation). Free valet parking service is available.   Special note: Every effort is made to have your procedure done on time. Please understand that emergencies sometimes delay scheduled procedures.  2. Diet: Do not eat solid foods after midnight.  The patient may have clear liquids until 5am upon the day of the procedure.  3. Labs: Recent Cmet and CBC drawn 02/20/20.  4. Medication instructions in preparation for your procedure:   Contrast Allergy: No   Current Outpatient Medications (Endocrine & Metabolic):  .  insulin glargine (LANTUS) 100 UNIT/ML injection, Inject 65 Units into the skin daily. .  insulin lispro (HUMALOG) 100 UNIT/ML KiwkPen, Inject 10-16 Units into the skin 3 (three) times daily with meals.  .  methimazole (TAPAZOLE) 10 MG tablet, Take 2 tablets (20 mg total) by mouth 2 (two) times daily.  Current Outpatient Medications (Cardiovascular):  .  atorvastatin (LIPITOR) 80 MG tablet, Take 80 mg by mouth at bedtime. .  benazepril (LOTENSIN) 40 MG tablet, Take 1 tablet (40 mg total) by mouth daily. Marland Kitchen  ezetimibe (ZETIA) 10 MG tablet, TAKE 1 TABLET(10 MG) BY MOUTH DAILY .  furosemide (LASIX) 20 MG tablet, Take 1 tablet (20 mg total) by mouth 2 (two) times daily as needed. .  isosorbide mononitrate (IMDUR) 30 MG 24 hr tablet, Take 1 tablet (30 mg total) by mouth daily. .  metoprolol tartrate (LOPRESSOR) 25 MG tablet, Take one tablet twice daily. Take an additional tablet as needed for palpitations, atrial fibrillation. .  nitroGLYCERIN (NITROSTAT) 0.4 MG SL tablet, Place 1 tablet  (0.4 mg total) under the tongue every 5 (five) minutes as needed for chest pain. Marland Kitchen  VIAGRA 100 MG tablet, TAKE 1 TABLET BY MOUTH EVERY DAY AS NEEDED   Current Outpatient Medications (Analgesics):  .  acetaminophen (TYLENOL) 500 MG tablet, Take 500 mg by mouth every 6 (six) hours as needed for pain.   Current Outpatient Medications (Hematological):  .  warfarin (COUMADIN) 5 MG tablet, TAKE 1 TABLET BY MOUTH EVERY DAY AS DIRECTED BY COUMADIN CLINIC  Current Outpatient Medications (Other):  .  cholecalciferol (VITAMIN D3) 25 MCG (1000 UNIT) tablet, Take 1,000 Units by mouth daily. .  INSULIN SYRINGE 1CC/29G 29G X 1/2" 1 ML MISC, USE AS DIRECTED .  ONE TOUCH ULTRA TEST test strip, TEST THREE TIMES A DAY AS DIRECTED. *For reference purposes while preparing patient instructions.   Delete this med list prior to printing instructions for patient.*  Stop taking Coumadin (Warfarin) on Saturday, November 27.    Per Coumadin clinic:   Per office protocol, patient can hold warfarin for 5 days prior to procedure.    Patient will not need bridging with Lovenox (enoxaparin) around procedure.  If not bridging, patient should restart warfarin on the evening of procedure or day after, at discretion of procedure MD  Stop taking, Lasix (Furosemide)  Friday, December 3, (Morning of procedure)  Take only 1/2 dose of your insulin the evening before your procedure and HOLD all insulin/Diabetic meds morning of procedure.    On the morning of your procedure, take  any morning medicines NOT listed above.  You may use sips of water.  5. Plan for one night stay--bring personal belongings. 6. Bring a current list of your medications and current insurance cards. 7. You MUST have a responsible person to drive you home. 8. Someone MUST be with you the first 24 hours after you arrive home or your discharge will be delayed. 9. Please wear clothes that are easy to get on and off and wear slip-on shoes.   COVID  PRE- TEST: You will need a COVID TEST prior to the procedure:  LOCATION: [facility].  DATE/TIME:  Wednesday 03/04/20 any time between 8AM - 1PM   Pt verbalized understanding of instructions. Also released instructions to pt's MyChart and advised he call our office with any questions.

## 2020-02-24 NOTE — Telephone Encounter (Signed)
Spoke to pt to schedule left heart cath with Dr. Rockey Situ @ [facility]. Pt agrees to schedule Friday 03/06/20. Notified pt I will call him back to go over instructions.

## 2020-02-25 ENCOUNTER — Other Ambulatory Visit: Payer: Self-pay | Admitting: Physician Assistant

## 2020-02-25 ENCOUNTER — Telehealth: Payer: Self-pay

## 2020-02-25 ENCOUNTER — Other Ambulatory Visit: Payer: Self-pay

## 2020-02-25 ENCOUNTER — Other Ambulatory Visit
Admission: RE | Admit: 2020-02-25 | Discharge: 2020-02-25 | Disposition: A | Discharge status: Home / Self Care | Payer: 59 | Attending: Physician Assistant | Admitting: Physician Assistant

## 2020-02-25 DIAGNOSIS — E875 Hyperkalemia: Secondary | ICD-10-CM | POA: Diagnosis not present

## 2020-02-25 DIAGNOSIS — I10 Essential (primary) hypertension: Secondary | ICD-10-CM | POA: Diagnosis present

## 2020-02-25 DIAGNOSIS — R079 Chest pain, unspecified: Secondary | ICD-10-CM | POA: Diagnosis present

## 2020-02-25 DIAGNOSIS — I25118 Atherosclerotic heart disease of native coronary artery with other forms of angina pectoris: Secondary | ICD-10-CM | POA: Diagnosis present

## 2020-02-25 DIAGNOSIS — I480 Paroxysmal atrial fibrillation: Secondary | ICD-10-CM | POA: Diagnosis present

## 2020-02-25 LAB — BASIC METABOLIC PANEL
Anion gap: 10 (ref 5–15)
BUN: 25 mg/dL — ABNORMAL HIGH (ref 8–23)
CO2: 22 mmol/L (ref 22–32)
Calcium: 9 mg/dL (ref 8.9–10.3)
Chloride: 101 mmol/L (ref 98–111)
Creatinine, Ser: 1.38 mg/dL — ABNORMAL HIGH (ref 0.61–1.24)
GFR, Estimated: 57 mL/min — ABNORMAL LOW (ref >60)
Glucose, Bld: 278 mg/dL — ABNORMAL HIGH (ref 70–99)
Potassium: 4.4 mmol/L (ref 3.5–5.1)
Sodium: 133 mmol/L — ABNORMAL LOW (ref 135–145)

## 2020-02-25 LAB — TSH: TSH: 0.034 u[IU]/mL — ABNORMAL LOW (ref 0.350–4.500)

## 2020-02-25 LAB — T4, FREE: Free T4: 1.34 ng/dL — ABNORMAL HIGH (ref 0.61–1.12)

## 2020-02-25 MED ORDER — SODIUM CHLORIDE 0.9% FLUSH
3.0000 mL | Freq: Two times a day (BID) | INTRAVENOUS | Status: AC
  Filled 2020-02-25: qty 3

## 2020-02-25 NOTE — Telephone Encounter (Signed)
-----   Message from Arvil Chaco, PA-C sent at 02/25/2020  1:00 PM EST ----- Regarding: Right heart cath to be added to his LHC Hi guys!  I tried to help by calling scheduling to change this guy from [facility] only to [facility].  No change in date.  Just tacking on a RHC to the left for elevated right heart pressures on echo.  I didn't reach Pamala Hurry, so I left a voicemail for her to call our office back.   Thanks! JV

## 2020-02-26 ENCOUNTER — Other Ambulatory Visit: Payer: Self-pay | Admitting: Physician Assistant

## 2020-02-26 MED ORDER — SODIUM CHLORIDE 0.9% FLUSH
3.0000 mL | Freq: Two times a day (BID) | INTRAVENOUS | Status: DC
  Filled 2020-02-26: qty 3

## 2020-02-26 NOTE — Progress Notes (Signed)
  The risks [stroke (1 in 1000), death (1 in 59), kidney failure [usually temporary] (1 in 500), bleeding (1 in 200), allergic reaction [possibly serious] (1 in 200)], benefits (diagnostic support and management of coronary artery disease) and alternatives of a cardiac catheterization were discussed in detail with Douglas Hunter and he is willing to proceed.

## 2020-02-26 NOTE — H&P (View-Only) (Signed)
  The risks [stroke (1 in 1000), death (1 in 27), kidney failure [usually temporary] (1 in 500), bleeding (1 in 200), allergic reaction [possibly serious] (1 in 200)], benefits (diagnostic support and management of coronary artery disease) and alternatives of a cardiac catheterization were discussed in detail with Douglas Hunter and he is willing to proceed.

## 2020-03-02 ENCOUNTER — Telehealth: Payer: Self-pay | Admitting: *Deleted

## 2020-03-02 NOTE — Telephone Encounter (Signed)
Spoke to pt. Notified of results and recc below. Pt verbalizes understanding of cath instructions and will also hold Benazepril the morning of his procedure on 03/06/20. Pt also states that regarding elevated glucose, he had not taken his insulin shot that morning. Pt has no questions at this time.

## 2020-03-02 NOTE — Telephone Encounter (Signed)
Attempted to call pt to give results and pre-cath instructions to hold Benazepril the morning of procedure. Pt has already been given other medication instructions noted below. No answer. LMOM TCB.

## 2020-03-02 NOTE — Telephone Encounter (Signed)
Attempted to call scheduling. No answer- I left a message for scheduling to please ensure that the patient is scheduled for Right & Left heart cath for 03/06/20. Please call back with any further questions/ concerns.

## 2020-03-02 NOTE — Telephone Encounter (Signed)
-----   Message from Arvil Chaco, PA-C sent at 02/26/2020  3:39 PM EST ----- Thyroid labs abnormal.  Will CC Dr. Quentin Ore and forward to endocrinology.   Renal function stable and improved from previous labs.  Given his baseline renal function, please ensure that he holds his benazepril and lasix the morning of his cardiac catheterization.  He should stop warfarin 5 days before the cath. Take 1/2 dose insulin the evening before his procedure + hold all diabetic medications the morning of his cath. Remainder of instructions as provided at time of scheduling cath. Will Cc Dr. Rockey Situ in preparation for cath.  Potassium at goal with consideration of elevated glucose at 278. Corrected sodium normal.

## 2020-03-03 NOTE — Telephone Encounter (Signed)
Checked schedule and the procedure has been updated to right and left heart cath. Closing encounter.

## 2020-03-04 ENCOUNTER — Other Ambulatory Visit: Payer: Self-pay | Admitting: Cardiovascular Disease

## 2020-03-04 ENCOUNTER — Other Ambulatory Visit: Payer: Self-pay | Admitting: Physician Assistant

## 2020-03-04 ENCOUNTER — Other Ambulatory Visit
Admission: RE | Admit: 2020-03-04 | Discharge: 2020-03-04 | Disposition: A | Discharge status: Home / Self Care | Payer: 59 | Source: Ambulatory Visit | Attending: Cardiovascular Disease | Admitting: Cardiovascular Disease

## 2020-03-04 ENCOUNTER — Other Ambulatory Visit: Payer: Self-pay

## 2020-03-04 DIAGNOSIS — I200 Unstable angina: Secondary | ICD-10-CM

## 2020-03-04 DIAGNOSIS — Z20822 Contact with and (suspected) exposure to covid-19: Secondary | ICD-10-CM | POA: Insufficient documentation

## 2020-03-04 DIAGNOSIS — Z01812 Encounter for preprocedural laboratory examination: Secondary | ICD-10-CM | POA: Diagnosis present

## 2020-03-04 LAB — SARS CORONAVIRUS 2 (TAT 6-24 HRS): SARS Coronavirus 2: NEGATIVE

## 2020-03-06 ENCOUNTER — Encounter
Admission: RE | Disposition: A | Discharge status: Home / Self Care | Payer: Self-pay | Source: Home / Self Care | Attending: Cardiovascular Disease

## 2020-03-06 ENCOUNTER — Other Ambulatory Visit: Payer: Self-pay

## 2020-03-06 ENCOUNTER — Encounter: Payer: Self-pay | Admitting: Cardiovascular Disease

## 2020-03-06 ENCOUNTER — Ambulatory Visit
Admission: RE | Admit: 2020-03-06 | Discharge: 2020-03-06 | Disposition: A | Discharge status: Home / Self Care | Payer: 59 | Attending: Cardiovascular Disease | Admitting: Cardiovascular Disease

## 2020-03-06 DIAGNOSIS — I480 Paroxysmal atrial fibrillation: Secondary | ICD-10-CM | POA: Diagnosis present

## 2020-03-06 DIAGNOSIS — Z7901 Long term (current) use of anticoagulants: Secondary | ICD-10-CM | POA: Insufficient documentation

## 2020-03-06 DIAGNOSIS — E119 Type 2 diabetes mellitus without complications: Secondary | ICD-10-CM | POA: Insufficient documentation

## 2020-03-06 DIAGNOSIS — I25118 Atherosclerotic heart disease of native coronary artery with other forms of angina pectoris: Secondary | ICD-10-CM | POA: Insufficient documentation

## 2020-03-06 DIAGNOSIS — R600 Localized edema: Secondary | ICD-10-CM | POA: Diagnosis not present

## 2020-03-06 DIAGNOSIS — E785 Hyperlipidemia, unspecified: Secondary | ICD-10-CM | POA: Diagnosis not present

## 2020-03-06 DIAGNOSIS — I252 Old myocardial infarction: Secondary | ICD-10-CM | POA: Insufficient documentation

## 2020-03-06 DIAGNOSIS — Z794 Long term (current) use of insulin: Secondary | ICD-10-CM | POA: Diagnosis not present

## 2020-03-06 DIAGNOSIS — R9431 Abnormal electrocardiogram [ECG] [EKG]: Secondary | ICD-10-CM

## 2020-03-06 DIAGNOSIS — I10 Essential (primary) hypertension: Secondary | ICD-10-CM | POA: Insufficient documentation

## 2020-03-06 DIAGNOSIS — I200 Unstable angina: Secondary | ICD-10-CM

## 2020-03-06 DIAGNOSIS — E0590 Thyrotoxicosis, unspecified without thyrotoxic crisis or storm: Secondary | ICD-10-CM | POA: Insufficient documentation

## 2020-03-06 DIAGNOSIS — I208 Other forms of angina pectoris: Secondary | ICD-10-CM

## 2020-03-06 DIAGNOSIS — I2510 Atherosclerotic heart disease of native coronary artery without angina pectoris: Secondary | ICD-10-CM | POA: Diagnosis present

## 2020-03-06 DIAGNOSIS — Z79899 Other long term (current) drug therapy: Secondary | ICD-10-CM | POA: Diagnosis not present

## 2020-03-06 DIAGNOSIS — G4733 Obstructive sleep apnea (adult) (pediatric): Secondary | ICD-10-CM | POA: Insufficient documentation

## 2020-03-06 DIAGNOSIS — R0602 Shortness of breath: Secondary | ICD-10-CM | POA: Diagnosis not present

## 2020-03-06 HISTORY — PX: RIGHT/LEFT HEART CATH AND CORONARY ANGIOGRAPHY: CATH118266

## 2020-03-06 LAB — GLUCOSE, CAPILLARY: Glucose-Capillary: 286 mg/dL — ABNORMAL HIGH (ref 70–99)

## 2020-03-06 SURGERY — RIGHT/LEFT HEART CATH AND CORONARY ANGIOGRAPHY
Anesthesia: Moderate Sedation

## 2020-03-06 MED ORDER — SODIUM CHLORIDE 0.9 % IV SOLN: INTRAVENOUS | Status: DC

## 2020-03-06 MED ORDER — LABETALOL HCL 5 MG/ML IV SOLN: 10.0000 mg | INTRAVENOUS | Status: DC | PRN

## 2020-03-06 MED ORDER — SODIUM CHLORIDE 0.9% FLUSH: 3.0000 mL | Freq: Two times a day (BID) | INTRAVENOUS | Status: DC

## 2020-03-06 MED ORDER — HEPARIN (PORCINE) IN NACL 1000-0.9 UT/500ML-% IV SOLN
INTRAVENOUS | Status: AC
  Filled 2020-03-06: qty 1000

## 2020-03-06 MED ORDER — SODIUM CHLORIDE 0.9% FLUSH: 3.0000 mL | INTRAVENOUS | Status: DC | PRN

## 2020-03-06 MED ORDER — FENTANYL CITRATE (PF) 100 MCG/2ML IJ SOLN
INTRAMUSCULAR | Status: AC
  Filled 2020-03-06: qty 2

## 2020-03-06 MED ORDER — LIDOCAINE HCL (PF) 1 % IJ SOLN
INTRAMUSCULAR | Status: DC | PRN
  Administered 2020-03-06: 30 mL

## 2020-03-06 MED ORDER — MIDAZOLAM HCL 2 MG/2ML IJ SOLN
INTRAMUSCULAR | Status: DC | PRN
  Administered 2020-03-06: 1 mg via INTRAVENOUS

## 2020-03-06 MED ORDER — SODIUM CHLORIDE 0.9 % WEIGHT BASED INFUSION: 1.0000 mL/kg/h | INTRAVENOUS | Status: DC

## 2020-03-06 MED ORDER — HYDRALAZINE HCL 20 MG/ML IJ SOLN: 10.0000 mg | INTRAMUSCULAR | Status: DC | PRN

## 2020-03-06 MED ORDER — IOHEXOL 300 MG/ML  SOLN
INTRAMUSCULAR | Status: DC | PRN
  Administered 2020-03-06: 80 mL

## 2020-03-06 MED ORDER — SODIUM CHLORIDE 0.9 % IV SOLN: 250.0000 mL | INTRAVENOUS | Status: DC | PRN

## 2020-03-06 MED ORDER — ONDANSETRON HCL 4 MG/2ML IJ SOLN: 4.0000 mg | Freq: Four times a day (QID) | INTRAMUSCULAR | Status: DC | PRN

## 2020-03-06 MED ORDER — FENTANYL CITRATE (PF) 100 MCG/2ML IJ SOLN
INTRAMUSCULAR | Status: DC | PRN
  Administered 2020-03-06: 25 ug via INTRAVENOUS

## 2020-03-06 MED ORDER — MIDAZOLAM HCL 2 MG/2ML IJ SOLN
INTRAMUSCULAR | Status: AC
  Filled 2020-03-06: qty 2

## 2020-03-06 MED ORDER — HEPARIN (PORCINE) IN NACL 1000-0.9 UT/500ML-% IV SOLN
INTRAVENOUS | Status: DC | PRN
  Administered 2020-03-06: 500 mL

## 2020-03-06 MED ORDER — ACETAMINOPHEN 325 MG PO TABS: 650.0000 mg | ORAL_TABLET | ORAL | Status: DC | PRN

## 2020-03-06 MED ORDER — LIDOCAINE HCL (PF) 1 % IJ SOLN
INTRAMUSCULAR | Status: AC
  Filled 2020-03-06: qty 30

## 2020-03-06 SURGICAL SUPPLY — 10 items
CATH INFINITI 5FR JL4 (CATHETERS) ×3 IMPLANT
CATH INFINITI JR4 5F (CATHETERS) ×3 IMPLANT
CATH SWAN GANZ 7F STRAIGHT (CATHETERS) ×3 IMPLANT
DEVICE CLOSURE MYNXGRIP 5F (Vascular Products) ×3 IMPLANT
KIT MANI 3VAL PERCEP (MISCELLANEOUS) ×3 IMPLANT
NEEDLE PERC 18GX7CM (NEEDLE) ×3 IMPLANT
PACK CARDIAC CATH (CUSTOM PROCEDURE TRAY) ×3 IMPLANT
SHEATH AVANTI 5FR X 11CM (SHEATH) ×3 IMPLANT
SHEATH AVANTI 7FRX11 (SHEATH) ×3 IMPLANT
WIRE GUIDERIGHT .035X150 (WIRE) ×3 IMPLANT

## 2020-03-06 NOTE — Discharge Instructions (Signed)
Angiogram, Care After This sheet gives you information about how to care for yourself after your procedure. Your health care provider may also give you more specific instructions. If you have problems or questions, contact your health care provider. What can I expect after the procedure? After the procedure, it is common to have bruising and tenderness at the catheter insertion area. Follow these instructions at home: Insertion site care  Follow instructions from your health care provider about how to take care of your insertion site. Make sure you: ? Wash your hands with soap and water before you change your bandage (dressing). If soap and water are not available, use hand sanitizer. ? Change your dressing as told by your health care provider. ? Leave stitches (sutures), skin glue, or adhesive strips in place. These skin closures may need to stay in place for 2 weeks or longer. If adhesive strip edges start to loosen and curl up, you may trim the loose edges. Do not remove adhesive strips completely unless your health care provider tells you to do that.  Do not take baths, swim, or use a hot tub until your health care provider approves.  You may shower 24-48 hours after the procedure or as told by your health care provider. ? Gently wash the site with plain soap and water. ? Pat the area dry with a clean towel. ? Do not rub the site. This may cause bleeding.  Do not apply powder or lotion to the site. Keep the site clean and dry.  Check your insertion site every day for signs of infection. Check for: ? Redness, swelling, or pain. ? Fluid or blood. ? Warmth. ? Pus or a bad smell. Activity  Rest as told by your health care provider, usually for 1-2 days.  Do not lift anything that is heavier than 10 lbs. (4.5 kg) or as told by your health care provider.  Do not drive for 24 hours if you were given a medicine to help you relax (sedative).  Do not drive or use heavy machinery while  taking prescription pain medicine. General instructions   Return to your normal activities as told by your health care provider, usually in about a week. Ask your health care provider what activities are safe for you.  If the catheter site starts bleeding, lie flat and put pressure on the site. If the bleeding does not stop, get help right away. This is a medical emergency.  Drink enough fluid to keep your urine clear or pale yellow. This helps flush the contrast dye from your body.  Take over-the-counter and prescription medicines only as told by your health care provider.  Keep all follow-up visits as told by your health care provider. This is important. Contact a health care provider if:  You have a fever or chills.  You have redness, swelling, or pain around your insertion site.  You have fluid or blood coming from your insertion site.  The insertion site feels warm to the touch.  You have pus or a bad smell coming from your insertion site.  You have bruising around the insertion site.  You notice blood collecting in the tissue around the catheter site (hematoma). The hematoma may be painful to the touch. Get help right away if:  You have severe pain at the catheter insertion area.  The catheter insertion area swells very fast.  The catheter insertion area is bleeding, and the bleeding does not stop when you hold steady pressure on the area.    The area near or just beyond the catheter insertion site becomes pale, cool, tingly, or numb. These symptoms may represent a serious problem that is an emergency. Do not wait to see if the symptoms will go away. Get medical help right away. Call your local emergency services (911 in the [place] Do not drive yourself to the hospital. Summary  After the procedure, it is common to have bruising and tenderness at the catheter insertion area.  After the procedure, it is important to rest and drink plenty of fluids.  Do not take baths,  swim, or use a hot tub until your health care provider says it is okay to do so. You may shower 24-48 hours after the procedure or as told by your health care provider.  If the catheter site starts bleeding, lie flat and put pressure on the site. If the bleeding does not stop, get help right away. This is a medical emergency. This information is not intended to replace advice given to you by your health care provider. Make sure you discuss any questions you have with your health care provider. Document Revised: 03/03/2017 Document Reviewed: 02/24/2016 Elsevier Patient Education  2020 [employer]  

## 2020-03-06 NOTE — OR Nursing (Signed)
Pt last dose of Coumadin friday 11/26/231 Dr Rockey Situ notified no order for PTT recieved.

## 2020-03-06 NOTE — OR Nursing (Signed)
Libra blood sugar 299.

## 2020-03-08 NOTE — Interval H&P Note (Signed)
History and Physical Interval Note:  03/08/2020 7:11 PM  Douglas Hunter  has presented today for surgery, with the diagnosis of LT Heart Cath   CAD, prior MI, unstable angina, SOB,   Abnormal EKG.  The various methods of treatment have been discussed with the patient and family. After consideration of risks, benefits and other options for treatment, the patient has consented to  Procedure(s): RIGHT/LEFT HEART CATH AND CORONARY ANGIOGRAPHY (N/A) as a surgical intervention.  The patient's history has been reviewed, patient examined, no change in status, stable for surgery.  I have reviewed the patient's chart and labs.  Questions were answered to the patient's satisfaction.     [name]

## 2020-03-08 NOTE — Interval H&P Note (Signed)
History and Physical Interval Note:  03/08/2020 7:12 PM  Douglas Hunter  has presented today for surgery, with the diagnosis of LT Heart Cath   CAD, prior MI, unstable angina, SOB,   Abnormal EKG.  The various methods of treatment have been discussed with the patient and family. After consideration of risks, benefits and other options for treatment, the patient has consented to  Procedure(s): RIGHT/LEFT HEART CATH AND CORONARY ANGIOGRAPHY (N/A) as a surgical intervention.  The patient's history has been reviewed, patient examined, no change in status, stable for surgery.  I have reviewed the patient's chart and labs.  Questions were answered to the patient's satisfaction.     [name]

## 2020-03-08 NOTE — H&P (Signed)
H&P Addendum, precardiac catheterization  Patient was seen and evaluated prior to Cardiac catheterization procedure Symptoms, prior testing details again confirmed with the patient Patient examined, no significant change from prior exam Lab work reviewed in detail personally by myself Patient understands risk and benefit of the procedure, willing to proceed  Signed, Tim Landin Tallon, MD, Ph.D [facility]    

## 2020-03-11 ENCOUNTER — Encounter: Payer: Self-pay | Admitting: Cardiology

## 2020-03-11 ENCOUNTER — Ambulatory Visit (INDEPENDENT_AMBULATORY_CARE_PROVIDER_SITE_OTHER): Payer: 59 | Admitting: Cardiology

## 2020-03-11 ENCOUNTER — Other Ambulatory Visit: Payer: Self-pay

## 2020-03-11 ENCOUNTER — Ambulatory Visit (INDEPENDENT_AMBULATORY_CARE_PROVIDER_SITE_OTHER): Payer: 59

## 2020-03-11 VITALS — BP 94/62 | HR 132 | Ht 71.0 in | Wt 251.4 lb

## 2020-03-11 DIAGNOSIS — E669 Obesity, unspecified: Secondary | ICD-10-CM

## 2020-03-11 DIAGNOSIS — E66812 Obesity, class 2: Secondary | ICD-10-CM

## 2020-03-11 DIAGNOSIS — I480 Paroxysmal atrial fibrillation: Secondary | ICD-10-CM | POA: Diagnosis not present

## 2020-03-11 DIAGNOSIS — Z5181 Encounter for therapeutic drug level monitoring: Secondary | ICD-10-CM

## 2020-03-11 DIAGNOSIS — I4892 Unspecified atrial flutter: Secondary | ICD-10-CM | POA: Diagnosis not present

## 2020-03-11 DIAGNOSIS — I4819 Other persistent atrial fibrillation: Secondary | ICD-10-CM | POA: Diagnosis not present

## 2020-03-11 DIAGNOSIS — G4733 Obstructive sleep apnea (adult) (pediatric): Secondary | ICD-10-CM

## 2020-03-11 DIAGNOSIS — I200 Unstable angina: Secondary | ICD-10-CM | POA: Diagnosis not present

## 2020-03-11 LAB — POCT INR: INR: 1.4 — AB (ref 2.0–3.0)

## 2020-03-11 NOTE — Progress Notes (Signed)
Electrophysiology Office Follow up Visit Note:    Date:  03/11/2020   ID:  Douglas Hunter, DOB [birth year], MRN [id number]  PCP:  Sofie Hartigan, MD  [facility] Cardiologist:  [name], MD  [place] Electrophysiologist:  None    Interval History:    Douglas Hunter is a 64 y.o. male who presents for a follow up visit. They were last seen in clinic January 29, 2020 for paroxysmal atrial fibrillation.  He was taking Coumadin for stroke prophylaxis.  He had been off his amiodarone for at least 1 month.  The plan at that visit was to continue to allow the amiodarone to washout before considering starting dronedarone or dofetilide.  Since his appointment with me, he presented to the hospital with angina and underwent heart catheterization.  Heart catheterization on March 06, 2020 showed a CTO of the circumflex, moderate stenosis of the LAD and normal left ventricular function.  He was seen on November 18 by Douglas Quarry, PA-C.  At that appointment he noted an ongoing episode of atrial fibrillation lasting at least 5 days.  He was symptomatic with palpitations, chest pain and shortness of breath.  Today he tells me he is feeling more more episodes of atrial fibrillation.  Episodes continue to be symptomatic.  He is scared to get out and about because he worries that he will have more severe symptoms related to his atrial fibrillation.    Past Medical History:  Diagnosis Date  . Atrial flutter (Douglas Hunter)   . Coronary artery disease 08/2011   a. NSTEMI 08/2011, LHC w/ severe disease of LCx with good collaterals, lesion was high-risk for intervention due to a steep angulation of the LCx coming from the left main coronary artery as well as heavy calcifications, discused at cath conference w/ consensus advising med Rx  . GERD (gastroesophageal reflux disease)   . Hyperlipidemia   . Hypertension   . Myocardial infarction (Douglas Hunter)   . Paroxysmal atrial fibrillation (Douglas Hunter)    a. s/p  ablation 01/2013; b. prn amio/dilt; c. CHADS2VASc => 3 (HTN, DM, vascular disease); d. on Coumadin  . Poorly controlled diabetes mellitus (Douglas Hunter)   . RBBB   . Sleep apnea     Past Surgical History:  Procedure Laterality Date  . ABLATION  01/29/2013   PVI and CTI by Dr Rayann Heman for atrial flutter and paroxysmal atrial fibrillation  . ANTERIOR CERVICAL DECOMP/DISCECTOMY FUSION N/A 04/18/2018   Procedure: ANTERIOR CERVICAL DECOMPRESSION/DISCECTOMY FUSION 2 LEVELS C5-7;  Surgeon: Meade Maw, MD;  Location: [facility] ORS;  Service: Neurosurgery;  Laterality: N/A;  . ATRIAL FIBRILLATION ABLATION N/A 01/29/2013   Procedure: ATRIAL FIBRILLATION ABLATION;  Surgeon: Coralyn Mark, MD;  Location: [place] CATH LAB;  Service: Cardiovascular;  Laterality: N/A;  . CARDIAC CATHETERIZATION  08/03/2011  . COLONOSCOPY WITH PROPOFOL N/A 02/02/2016   Procedure: COLONOSCOPY WITH PROPOFOL;  Surgeon: Lucilla Lame, MD;  Location: [facility] ENDOSCOPY;  Service: Endoscopy;  Laterality: N/A;  . LUMBAR LAMINECTOMY/DECOMPRESSION MICRODISCECTOMY N/A 06/07/2017   Procedure: LUMBAR LAMINECTOMY/DECOMPRESSION MICRODISCECTOMY 2 LEVELS-L3-4,L4-5;  Surgeon: Meade Maw, MD;  Location: [facility] ORS;  Service: Neurosurgery;  Laterality: N/A;  . RIGHT/LEFT HEART CATH AND CORONARY ANGIOGRAPHY N/A 03/06/2020   Procedure: RIGHT/LEFT HEART CATH AND CORONARY ANGIOGRAPHY;  Surgeon: Minna Merritts, MD;  Location: [place] CV LAB;  Service: Cardiovascular;  Laterality: N/A;  . TEE WITHOUT CARDIOVERSION N/A 01/28/2013   Procedure: TRANSESOPHAGEAL ECHOCARDIOGRAM (TEE);  Surgeon: Lelon Perla, MD;  Location: [place];  Service: Cardiovascular;  Laterality: N/A;    Current Medications: Current Meds  Medication Sig  . acetaminophen (TYLENOL) 500 MG tablet Take 500 mg by mouth every 6 (six) hours as needed for mild pain or moderate pain.   Marland Kitchen atorvastatin (LIPITOR) 80 MG tablet Take 80 mg by mouth at bedtime.  . benazepril (LOTENSIN) 40 MG  tablet Take 1 tablet (40 mg total) by mouth daily.  . cholecalciferol (VITAMIN D3) 25 MCG (1000 UNIT) tablet Take 1,000 Units by mouth daily.  Marland Kitchen ezetimibe (ZETIA) 10 MG tablet TAKE 1 TABLET(10 MG) BY MOUTH DAILY (Patient taking differently: Take 10 mg by mouth at bedtime. )  . furosemide (LASIX) 20 MG tablet Take 1 tablet (20 mg total) by mouth 2 (two) times daily as needed. (Patient taking differently: Take 20 mg by mouth 2 (two) times daily. )  . insulin glargine (LANTUS) 100 UNIT/ML injection Inject 65 Units into the skin daily.  . insulin lispro (HUMALOG) 100 UNIT/ML KiwkPen Inject 10-16 Units into the skin 3 (three) times daily with meals. Sliding Scale  . INSULIN SYRINGE 1CC/29G 29G X 1/2" 1 ML MISC USE AS DIRECTED  . isosorbide mononitrate (IMDUR) 30 MG 24 hr tablet Take 1 tablet (30 mg total) by mouth daily.  . methimazole (TAPAZOLE) 10 MG tablet Take 2 tablets (20 mg total) by mouth 2 (two) times daily.  . metoprolol tartrate (LOPRESSOR) 25 MG tablet Take one tablet twice daily. Take an additional tablet as needed for palpitations, atrial fibrillation. (Patient taking differently: Take 25 mg by mouth 2 (two) times daily. Take an additional tablet as needed for palpitations, atrial fibrillation.)  . nitroGLYCERIN (NITROSTAT) 0.4 MG SL tablet Place 1 tablet (0.4 mg total) under the tongue every 5 (five) minutes as needed for chest pain.  . ONE TOUCH ULTRA TEST test strip TEST THREE TIMES A DAY AS DIRECTED.  Marland Kitchen VIAGRA 100 MG tablet TAKE 1 TABLET BY MOUTH EVERY DAY AS NEEDED (Patient taking differently: Take 100 mg by mouth as needed for erectile dysfunction. )  . warfarin (COUMADIN) 5 MG tablet TAKE 1 TABLET BY MOUTH EVERY DAY AS DIRECTED BY COUMADIN CLINIC (Patient taking differently: Take 2.5 mg by mouth one time only at 4 PM. )     Allergies:   Patient has no known allergies.   Social History   Socioeconomic History  . Marital status: Married    Spouse name: Not on file  . Number of  children: Not on file  . Years of education: Not on file  . Highest education level: Not on file  Occupational History  . Not on file  Tobacco Use  . Smoking status: Former Smoker    Packs/day: 1.50    Years: 35.00    Pack years: 52.50    Types: Cigarettes    Quit date: 01/16/2008    Years since quitting: 12.1  . Smokeless tobacco: Never Used  Vaping Use  . Vaping Use: Never used  Substance and Sexual Activity  . Alcohol use: Not Currently    Comment: occasional  . Drug use: No  . Sexual activity: Yes  Other Topics Concern  . 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 Difficulty of Paying Living Expenses: Not on file  Food Insecurity:   . Worried About [employer] of  Food in the Last Year: Not on file  . Ran Out of Food in the Last Year: Not on file  Transportation Needs:   . Lack of Transportation (Medical): Not on file  . Lack of Transportation (Non-Medical): Not on file  Physical Activity:   . Days of Exercise per Week: Not on file  . Minutes of Exercise per Session: Not on file  Stress:   . Feeling of Stress : Not on file  Social Connections:   . Frequency of Communication with Friends and Family: Not on file  . Frequency of Social Gatherings with Friends and Family: Not on file  . Attends Religious Services: Not on file  . Active Member of Clubs or Organizations: Not on file  . Attends Archivist Meetings: Not on file  . Marital Status: Not on file     Family History: The patient's family history includes Cancer in his father; Diabetes in his son; Heart attack in his maternal uncle; Heart attack (age of onset: 30) in his father; Heart disease in his brother, mother, and paternal grandfather; Stroke in his mother.  ROS:   Please see the history of present illness.    All  other systems reviewed and are negative.  EKGs/Labs/Other Studies Reviewed:    The following studies were reviewed today: Prior notes, heart catheterization  March 06, 2020 heart catheterization  Prox LAD lesion is 40% stenosed.  Mid LAD lesion is 50% stenosed.  Prox Cx to Mid Cx lesion is 100% stenosed.  3rd Mrg lesion is 100% stenosed.  The left ventricular systolic function is normal.  LV end diastolic pressure is normal.  The left ventricular ejection fraction is 50-55% by visual estimate.  There is no mitral valve regurgitation.  There is mild aortic valve stenosis.   EKG:  The ekg ordered today demonstrates atrial fibrillation/flutter with a rapid ventricular response.  Ventricular rate 132 bpm.  Right bundle branch block in lead V1.  QTc 440 ms with a wide right bundle.  Recent Labs: 02/20/2020: ALT 21; Hemoglobin 12.9; Magnesium 1.9; Platelets 269 02/25/2020: BUN 25; Creatinine, Ser 1.38; Potassium 4.4; Sodium 133; TSH 0.034  Recent Lipid Panel    Component Value Date/Time   CHOL 162 01/13/2015 0854   CHOL 112 01/12/2013 0013   TRIG 62 01/13/2015 0854   TRIG 80 01/12/2013 0013   HDL 47 01/12/2013 0013   VLDL 12 01/13/2015 0854   VLDL 16 01/12/2013 0013   LDLCALC 49 01/12/2013 0013    Physical Exam:    VS:  BP 94/62   Pulse (!) 132   Ht 5\' 11"  (1.803 m)   Wt 251 lb 6.4 oz (114 kg)   SpO2 99%   BMI 35.06 kg/m     Wt Readings from Last 3 Encounters:  03/11/20 251 lb 6.4 oz (114 kg)  03/06/20 256 lb (116.1 kg)  02/20/20 254 lb 2 oz (115.3 kg)     GEN:  Well nourished, well developed in no acute distress HEENT: Normal NECK: No JVD; No carotid bruits LYMPHATICS: No lymphadenopathy CARDIAC: Tachycardic, irregularly irregular, no murmurs, rubs, gallops.  Right femoral artery access site without hematoma, ecchymoses.  Site is very well-healed. RESPIRATORY:  Clear to auscultation without rales, wheezing or rhonchi  ABDOMEN: Soft, non-tender,  non-distended MUSCULOSKELETAL:  No edema; No deformity  SKIN: Warm and dry NEUROLOGIC:  Alert and oriented x 3 PSYCHIATRIC:  Normal affect   ASSESSMENT:    1. Persistent atrial fibrillation ([place])   2.  Unstable angina ([facility])   3. Class 2 obesity in adult, unspecified BMI, unspecified obesity type, unspecified whether serious comorbidity present   4. OSA (obstructive sleep apnea)    PLAN:    In order of problems listed above:  1. Patient continues to have symptomatic persistent atrial fibrillation.  He has previously been trialed on amiodarone but this caused significant thyroid dysfunction requiring the medication to be stopped.  Given increasing frequency of his atrial fibrillation episodes and symptomatic nature of the episodes, favor starting an antiarrhythmic.  Discussed dofetilide with the patient and his wife during her visit today.  Discussed the risks associated with Tikosyn administration and the necessity to start this medicine in the hospital.  They would like to proceed with scheduling dofetilide loading.  2.  Sleep apnea on CPAP Encouraged adherence to CPAP therapy.  Patient tells me that sometimes after he gets up in the middle the night to use the restroom he does not put the CPAP mask back on.  3.  Obesity  4.  Angina Likely due to the CTO of the left circumflex in the setting of uncontrolled ventricular rates with his atrial fibrillation.  No angina during today's visit.      Medication Adjustments/Labs and Tests Ordered: Current medicines are reviewed at length with the patient today.  Concerns regarding medicines are outlined above.  Orders Placed This Encounter  Procedures  . EKG 12-Lead   No orders of the defined types were placed in this encounter.    Signed, Lars Mage, MD, [facility]  03/11/2020 9:08 AM    Electrophysiology [facility]

## 2020-03-11 NOTE — Patient Instructions (Signed)
-   take 1 whole tablet today and tomorrow, then  - continue warfarin dosage of 1/2 tablet every day - recheck INR in 6 weeks

## 2020-03-11 NOTE — Patient Instructions (Addendum)
Medication Instructions:  None ordered. *If you need a refill on your cardiac medications before your next appointment, please call your pharmacy*  Lab Work: None ordered.  If you have labs (blood work) drawn today and your tests are completely normal, you will receive your results only by: Marland Kitchen MyChart Message (if you have MyChart) OR . A paper copy in the mail If you have any lab test that is abnormal or we need to change your treatment, we will call you to review the results.  Testing/Procedures: None ordered.  Follow-Up: At [facility], you and your health needs are our priority.  As part of our continuing mission to provide you with exceptional heart care, we have created designated Provider Care Teams.  These Care Teams include your primary Cardiologist (physician) and Advanced Practice Providers (APPs -  Physician Assistants and Nurse Practitioners) who all work together to provide you with the care you need, when you need it.  Your next appointment:    Will be with the afib clinic to initiate dofetilide admission.  AFIB CLINIC INFORMATION: Your appointment is scheduled on: __________________ The AFib Clinic is located in the Heart and Vascular Specialty Clinics at [facility]. Parking instructions/directions: [address] (off [address]). When you pull in to Entrance C, there is an underground parking garage to your right. The code to enter the garage is 3007. Take the elevators to the first floor. Follow the signs to the Heart and Vascular Specialty Clinics. You will see registration at the end of the hallway.  Phone number: [phone]   Dofetilide capsules What is this medicine? DOFETILIDE (doe FET il ide) is an antiarrhythmic drug. It helps make your heart beat regularly. This medicine also helps to slow rapid heartbeats. This medicine may be used for other purposes; ask your health care provider or pharmacist if you have questions. COMMON BRAND NAME(S):  Tikosyn What should I tell my health care provider before I take this medicine? They need to know if you have any of these conditions:  heart disease  history of irregular heartbeat  history of low levels of potassium or magnesium in the blood  kidney disease  liver disease  an unusual or allergic reaction to dofetilide, other medicines, foods, dyes, or preservatives  pregnant or trying to get pregnant  breast-feeding How should I use this medicine? Take this medicine by mouth with a glass of water. Follow the directions on the prescription label. Do not take with grapefruit juice. You can take it with or without food. If it upsets your stomach, take it with food. Take your medicine at regular intervals. Do not take it more often than directed. Do not stop taking except on your doctor's advice. A special MedGuide will be given to you by the pharmacist with each prescription and refill. Be sure to read this information carefully each time. Talk to your pediatrician regarding the use of this medicine in children. Special care may be needed. Overdosage: If you think you have taken too much of this medicine contact a poison control center or emergency room at once. NOTE: This medicine is only for you. Do not share this medicine with others. What if I miss a dose? If you miss a dose, skip it. Take your next dose at the normal time. Do not take extra or 2 doses at the same time to make up for the missed dose. What may interact with this medicine? Do not take this medicine with any of the following  medications:  cimetidine  cisapride  dolutegravir  dronedarone  hydrochlorothiazide  ketoconazole  megestrol  pimozide  prochlorperazine  thioridazine  trimethoprim  verapamil This medicine may also interact with the following medications:  amiloride  cannabinoids  certain antibiotics like erythromycin or clarithromycin  certain antiviral medicines for HIV or  hepatitis  certain medicines for depression, anxiety, or psychotic disorders  digoxin  diltiazem  grapefruit juice  metformin  nefazodone  other medicines that prolong the QT interval (an abnormal heart rhythm)  quinine  triamterene  zafirlukast  ziprasidone This list may not describe all possible interactions. Give your health care provider a list of all the medicines, herbs, non-prescription drugs, or dietary supplements you use. Also tell them if you smoke, drink alcohol, or use illegal drugs. Some items may interact with your medicine. What should I watch for while using this medicine? Your condition will be monitored carefully while you are receiving this medicine. What side effects may I notice from receiving this medicine? Side effects that you should report to your doctor or health care professional as soon as possible:  allergic reactions like skin rash, itching or hives, swelling of the face, lips, or tongue  breathing problems  chest pain or chest tightness  dizziness  signs and symptoms of a dangerous change in heartbeat or heart rhythm like chest pain; dizziness; fast or irregular heartbeat; palpitations; feeling faint or lightheaded, falls; breathing problems  signs and symptoms of electrolyte imbalance like severe diarrhea, unusual sweating, vomiting, loss of appetite, increased thirst  swelling of the ankles, legs, or feet  tingling, numbness in the hands or feet Side effects that usually do not require medical attention (report to your doctor or health care professional if they continue or are bothersome):  diarrhea  general ill feeling or flu-like symptoms  headache  nausea  trouble sleeping  stomach pain This list may not describe all possible side effects. Call your doctor for medical advice about side effects. You may report side effects to FDA at 1-800-FDA-1088. Where should I keep my medicine? Keep out of the reach of children. Store  at room temperature between 15 and 30 degrees C (59 and 86 degrees F). Throw away any unused medicine after the expiration date. NOTE: This sheet is a summary. It may not cover all possible information. If you have questions about this medicine, talk to your doctor, pharmacist, or health care provider.  2020 Elsevier/Gold Standard (2018-03-12 10:18:48)

## 2020-03-13 ENCOUNTER — Other Ambulatory Visit: Payer: Self-pay

## 2020-03-13 ENCOUNTER — Inpatient Hospital Stay
Admission: EM | Admit: 2020-03-13 | Discharge: 2020-03-13 | DRG: 309 | Disposition: A | Discharge status: Home / Self Care | Payer: 59 | Attending: Obstetrics and Gynecology | Admitting: Obstetrics and Gynecology

## 2020-03-13 ENCOUNTER — Emergency Department: Payer: 59

## 2020-03-13 DIAGNOSIS — I252 Old myocardial infarction: Secondary | ICD-10-CM

## 2020-03-13 DIAGNOSIS — R778 Other specified abnormalities of plasma proteins: Secondary | ICD-10-CM | POA: Diagnosis present

## 2020-03-13 DIAGNOSIS — E872 Acidosis, unspecified: Secondary | ICD-10-CM

## 2020-03-13 DIAGNOSIS — E1122 Type 2 diabetes mellitus with diabetic chronic kidney disease: Secondary | ICD-10-CM | POA: Diagnosis present

## 2020-03-13 DIAGNOSIS — Z20822 Contact with and (suspected) exposure to covid-19: Secondary | ICD-10-CM | POA: Diagnosis present

## 2020-03-13 DIAGNOSIS — Z794 Long term (current) use of insulin: Secondary | ICD-10-CM

## 2020-03-13 DIAGNOSIS — I480 Paroxysmal atrial fibrillation: Secondary | ICD-10-CM | POA: Diagnosis not present

## 2020-03-13 DIAGNOSIS — E0590 Thyrotoxicosis, unspecified without thyrotoxic crisis or storm: Secondary | ICD-10-CM | POA: Diagnosis present

## 2020-03-13 DIAGNOSIS — R55 Syncope and collapse: Secondary | ICD-10-CM

## 2020-03-13 DIAGNOSIS — I10 Essential (primary) hypertension: Secondary | ICD-10-CM | POA: Diagnosis not present

## 2020-03-13 DIAGNOSIS — Z87891 Personal history of nicotine dependence: Secondary | ICD-10-CM | POA: Diagnosis not present

## 2020-03-13 DIAGNOSIS — R791 Abnormal coagulation profile: Secondary | ICD-10-CM | POA: Diagnosis present

## 2020-03-13 DIAGNOSIS — Z7901 Long term (current) use of anticoagulants: Secondary | ICD-10-CM | POA: Diagnosis not present

## 2020-03-13 DIAGNOSIS — N182 Chronic kidney disease, stage 2 (mild): Secondary | ICD-10-CM | POA: Diagnosis present

## 2020-03-13 DIAGNOSIS — Z79899 Other long term (current) drug therapy: Secondary | ICD-10-CM

## 2020-03-13 DIAGNOSIS — G4733 Obstructive sleep apnea (adult) (pediatric): Secondary | ICD-10-CM | POA: Diagnosis present

## 2020-03-13 DIAGNOSIS — Z8249 Family history of ischemic heart disease and other diseases of the circulatory system: Secondary | ICD-10-CM

## 2020-03-13 DIAGNOSIS — Z981 Arthrodesis status: Secondary | ICD-10-CM

## 2020-03-13 DIAGNOSIS — K219 Gastro-esophageal reflux disease without esophagitis: Secondary | ICD-10-CM | POA: Diagnosis present

## 2020-03-13 DIAGNOSIS — D72829 Elevated white blood cell count, unspecified: Secondary | ICD-10-CM | POA: Diagnosis present

## 2020-03-13 DIAGNOSIS — I2510 Atherosclerotic heart disease of native coronary artery without angina pectoris: Secondary | ICD-10-CM | POA: Diagnosis present

## 2020-03-13 DIAGNOSIS — E860 Dehydration: Secondary | ICD-10-CM | POA: Diagnosis present

## 2020-03-13 DIAGNOSIS — I129 Hypertensive chronic kidney disease with stage 1 through stage 4 chronic kidney disease, or unspecified chronic kidney disease: Secondary | ICD-10-CM | POA: Diagnosis present

## 2020-03-13 DIAGNOSIS — I4891 Unspecified atrial fibrillation: Secondary | ICD-10-CM | POA: Diagnosis present

## 2020-03-13 DIAGNOSIS — N179 Acute kidney failure, unspecified: Secondary | ICD-10-CM

## 2020-03-13 DIAGNOSIS — Z6836 Body mass index (BMI) 36.0-36.9, adult: Secondary | ICD-10-CM

## 2020-03-13 DIAGNOSIS — R42 Dizziness and giddiness: Secondary | ICD-10-CM

## 2020-03-13 DIAGNOSIS — E1165 Type 2 diabetes mellitus with hyperglycemia: Secondary | ICD-10-CM | POA: Diagnosis present

## 2020-03-13 DIAGNOSIS — I25118 Atherosclerotic heart disease of native coronary artery with other forms of angina pectoris: Secondary | ICD-10-CM | POA: Diagnosis present

## 2020-03-13 DIAGNOSIS — E669 Obesity, unspecified: Secondary | ICD-10-CM | POA: Diagnosis present

## 2020-03-13 DIAGNOSIS — R7989 Other specified abnormal findings of blood chemistry: Secondary | ICD-10-CM

## 2020-03-13 DIAGNOSIS — E785 Hyperlipidemia, unspecified: Secondary | ICD-10-CM | POA: Diagnosis present

## 2020-03-13 HISTORY — DX: Personal history of other medical treatment: Z92.89

## 2020-03-13 HISTORY — DX: Acute kidney failure, unspecified: N17.9

## 2020-03-13 LAB — CBC WITH DIFFERENTIAL/PLATELET
Abs Immature Granulocytes: 0.07 10*3/uL (ref 0.00–0.07)
Basophils Absolute: 0.1 10*3/uL (ref 0.0–0.1)
Basophils Relative: 0 %
Eosinophils Absolute: 0.2 10*3/uL (ref 0.0–0.5)
Eosinophils Relative: 1 %
HCT: 40.2 % (ref 39.0–52.0)
Hemoglobin: 13.3 g/dL (ref 13.0–17.0)
Immature Granulocytes: 1 %
Lymphocytes Relative: 10 %
Lymphs Abs: 1.5 10*3/uL (ref 0.7–4.0)
MCH: 30.5 pg (ref 26.0–34.0)
MCHC: 33.1 g/dL (ref 30.0–36.0)
MCV: 92.2 fL (ref 80.0–100.0)
Monocytes Absolute: 1.2 10*3/uL — ABNORMAL HIGH (ref 0.1–1.0)
Monocytes Relative: 8 %
Neutro Abs: 11.7 10*3/uL — ABNORMAL HIGH (ref 1.7–7.7)
Neutrophils Relative %: 80 %
Platelets: 244 10*3/uL (ref 150–400)
RBC: 4.36 MIL/uL (ref 4.22–5.81)
RDW: 13.2 % (ref 11.5–15.5)
WBC: 14.6 10*3/uL — ABNORMAL HIGH (ref 4.0–10.5)
nRBC: 0 % (ref 0.0–0.2)

## 2020-03-13 LAB — RESP PANEL BY RT-PCR (FLU A&B, COVID) ARPGX2
Influenza A by PCR: NEGATIVE
Influenza B by PCR: NEGATIVE
SARS Coronavirus 2 by RT PCR: NEGATIVE

## 2020-03-13 LAB — COMPREHENSIVE METABOLIC PANEL
ALT: 22 U/L (ref 0–44)
AST: 22 U/L (ref 15–41)
Albumin: 3.5 g/dL (ref 3.5–5.0)
Alkaline Phosphatase: 119 U/L (ref 38–126)
Anion gap: 10 (ref 5–15)
BUN: 35 mg/dL — ABNORMAL HIGH (ref 8–23)
CO2: 19 mmol/L — ABNORMAL LOW (ref 22–32)
Calcium: 8.5 mg/dL — ABNORMAL LOW (ref 8.9–10.3)
Chloride: 106 mmol/L (ref 98–111)
Creatinine, Ser: 1.53 mg/dL — ABNORMAL HIGH (ref 0.61–1.24)
GFR, Estimated: 50 mL/min — ABNORMAL LOW (ref >60)
Glucose, Bld: 178 mg/dL — ABNORMAL HIGH (ref 70–99)
Potassium: 3.7 mmol/L (ref 3.5–5.1)
Sodium: 135 mmol/L (ref 135–145)
Total Bilirubin: 0.7 mg/dL (ref 0.3–1.2)
Total Protein: 7.3 g/dL (ref 6.5–8.1)

## 2020-03-13 LAB — TROPONIN I (HIGH SENSITIVITY)
Troponin I (High Sensitivity): 72 ng/L — ABNORMAL HIGH (ref <18)
Troponin I (High Sensitivity): 70 ng/L — ABNORMAL HIGH (ref <18)

## 2020-03-13 LAB — CBG MONITORING, ED
Glucose-Capillary: 235 mg/dL — ABNORMAL HIGH (ref 70–99)
Glucose-Capillary: 262 mg/dL — ABNORMAL HIGH (ref 70–99)

## 2020-03-13 LAB — TSH: TSH: 2.85 u[IU]/mL (ref 0.350–4.500)

## 2020-03-13 LAB — PROTIME-INR
INR: 1.8 — ABNORMAL HIGH (ref 0.8–1.2)
Prothrombin Time: 20.2 seconds — ABNORMAL HIGH (ref 11.4–15.2)

## 2020-03-13 MED ORDER — ISOSORBIDE MONONITRATE ER 60 MG PO TB24: 30.0000 mg | ORAL_TABLET | Freq: Every day | ORAL | Status: DC

## 2020-03-13 MED ORDER — EZETIMIBE 10 MG PO TABS
10.0000 mg | ORAL_TABLET | Freq: Every day | ORAL | Status: DC
  Filled 2020-03-13: qty 1

## 2020-03-13 MED ORDER — WARFARIN - PHARMACIST DOSING INPATIENT
Freq: Every day | Status: DC
  Filled 2020-03-13: qty 1

## 2020-03-13 MED ORDER — METOPROLOL TARTRATE 50 MG PO TABS
50.0000 mg | ORAL_TABLET | Freq: Two times a day (BID) | ORAL | Status: DC
  Administered 2020-03-13: 50 mg via ORAL
  Filled 2020-03-13: qty 1

## 2020-03-13 MED ORDER — DILTIAZEM HCL-DEXTROSE 125-5 MG/125ML-% IV SOLN (PREMIX): 5.0000 mg/h | INTRAVENOUS | Status: DC

## 2020-03-13 MED ORDER — INSULIN GLARGINE 100 UNIT/ML ~~LOC~~ SOLN
55.0000 [IU] | Freq: Every day | SUBCUTANEOUS | Status: DC
  Administered 2020-03-13: 55 [IU] via SUBCUTANEOUS
  Filled 2020-03-13 (×2): qty 0.55

## 2020-03-13 MED ORDER — METOPROLOL TARTRATE 50 MG PO TABS: 50.0000 mg | ORAL_TABLET | Freq: Two times a day (BID) | ORAL | 1 refills | Status: DC

## 2020-03-13 MED ORDER — DILTIAZEM HCL ER COATED BEADS 300 MG PO CP24
300.0000 mg | ORAL_CAPSULE | Freq: Every day | ORAL | Status: DC
  Administered 2020-03-13: 300 mg via ORAL
  Filled 2020-03-13: qty 1

## 2020-03-13 MED ORDER — INSULIN ASPART 100 UNIT/ML ~~LOC~~ SOLN
0.0000 [IU] | Freq: Three times a day (TID) | SUBCUTANEOUS | Status: DC
  Administered 2020-03-13: 7 [IU] via SUBCUTANEOUS
  Administered 2020-03-13: 11 [IU] via SUBCUTANEOUS
  Filled 2020-03-13 (×2): qty 1

## 2020-03-13 MED ORDER — DILTIAZEM HCL 25 MG/5ML IV SOLN
10.0000 mg | Freq: Once | INTRAVENOUS | Status: AC
  Administered 2020-03-13: 10 mg via INTRAVENOUS
  Filled 2020-03-13: qty 5

## 2020-03-13 MED ORDER — DILTIAZEM HCL-DEXTROSE 125-5 MG/125ML-% IV SOLN (PREMIX)
5.0000 mg/h | INTRAVENOUS | Status: DC
  Administered 2020-03-13: 5 mg/h via INTRAVENOUS
  Administered 2020-03-13: 10 mg/h via INTRAVENOUS
  Filled 2020-03-13: qty 125

## 2020-03-13 MED ORDER — ONDANSETRON HCL 4 MG/2ML IJ SOLN: 4.0000 mg | Freq: Four times a day (QID) | INTRAMUSCULAR | Status: DC | PRN

## 2020-03-13 MED ORDER — DILTIAZEM HCL ER COATED BEADS 180 MG PO CP24: 180.0000 mg | ORAL_CAPSULE | Freq: Every day | ORAL | 1 refills | Status: DC

## 2020-03-13 MED ORDER — ACETAMINOPHEN 325 MG PO TABS: 650.0000 mg | ORAL_TABLET | ORAL | Status: DC | PRN

## 2020-03-13 MED ORDER — WARFARIN SODIUM 3 MG PO TABS
3.0000 mg | ORAL_TABLET | Freq: Once | ORAL | Status: DC
  Filled 2020-03-13: qty 1

## 2020-03-13 MED ORDER — FUROSEMIDE 40 MG PO TABS: 20.0000 mg | ORAL_TABLET | Freq: Two times a day (BID) | ORAL | Status: DC

## 2020-03-13 MED ORDER — METHIMAZOLE 10 MG PO TABS
20.0000 mg | ORAL_TABLET | Freq: Two times a day (BID) | ORAL | Status: DC
  Administered 2020-03-13: 20 mg via ORAL
  Filled 2020-03-13 (×2): qty 2

## 2020-03-13 MED ORDER — INSULIN ASPART 100 UNIT/ML ~~LOC~~ SOLN: 0.0000 [IU] | Freq: Every day | SUBCUTANEOUS | Status: DC

## 2020-03-13 MED ORDER — ATORVASTATIN CALCIUM 20 MG PO TABS: 80.0000 mg | ORAL_TABLET | Freq: Every day | ORAL | Status: DC

## 2020-03-13 NOTE — Progress Notes (Signed)
PROGRESS NOTE    Douglas Hunter  MEQ:[id number] DOB: [birth year] DOA: 03/13/2020 PCP: Sofie Hartigan, MD  Outpatient Specialists: Arthor Captain    Brief Narrative:   Douglas Hunter is a 64 y.o. male with medical history significant for atrial fibrillation on Coumadin, CAD, HTN, DM, who presents to the emergency room by EMS for near syncope episode.  Patient has been having his metoprolol adjusted his cardiologist Dr. Rockey Situ as an outpatient.  On the night of arrival he reported acute onset of palpitations dizziness and diaphoresis.  He denied chest pain or shortness of breath.  Patient was otherwise in his usual state of health.  No recent illness.  Denies cough fever or chills, nausea or vomiting abdominal pain or diarrhea.  EMS recorded heart rate in the 150s to 160s and IV metoprolol was administered   Assessment & Plan:   Principal Problem:   Rapid atrial fibrillation (HCC) Active Problems:   HTN (hypertension)   Coronary artery disease   Postural dizziness with presyncope   Type 2 diabetes mellitus with hyperglycemia (HCC)   AKI (acute kidney injury) ([place])   Metabolic acidosis   Elevated troponin    Rapid atrial fibrillation (HCC) Symptomatic with presyncope. Currently rate controlled on dilt drip with normal BP. TSH wnl - continue dilt gtt - cardiology consulted, will appreciate their recs - warfarin per pharmacy, inr 1.8 on admission - hold home lasix 20 bid given aki  Hyperthyroid TSH wnl - cont methimazole  AKI (acute kidney injury) ([place]) Metabolic acidosis Creatinine 1.53, baseline around 1.2 - hold home lasix, monitor  HTN (hypertension) Patient normotensive on diltiazem - holding home benazapril and metoprolol and lasix for now  Elevated troponin Coronary artery disease Recent r/l cath with obstructive CAD, no intervention. Troponin of 72>70.  No chest pain, I don't see that ekg has been performed here. Suspect demand - cont imdur, atorva -  f/u ekg  Type 2 diabetes mellitus with hyperglycemia (HCC) Here glucose 178 - decrease home lantus to 55 qd - ssi resistant  OSA - qhs cpap  DVT prophylaxis: warfarin Code Status: full Family Communication: wife updated @ bedside  Status is: Inpatient  Remains inpatient appropriate because:Inpatient level of care appropriate due to severity of illness   Dispo: The patient is from: Home              Anticipated d/c is to: Home              Anticipated d/c date is: 1-3 days              Patient currently is not medically stable to d/c.        Consultants:  cardiology  Procedures: none  Antimicrobials:  none    Subjective: Feeling light chest flutter, no chest pain. No presyncope since being in bed in ED. No n/v/d. No fevers.  Objective: Vitals:   03/13/20 0430 03/13/20 0500 03/13/20 0530 03/13/20 0600  BP: 140/60 125/67 132/79 116/74  Pulse: 87 69 78 95  Resp: (!) 21 20  18   Temp:      TempSrc:      SpO2: 97% 97% 97% 97%  Weight:      Height:       No intake or output data in the 24 hours ending 03/13/20 0728 Filed Weights   03/13/20 0026  Weight: 117 kg    Examination:  General exam: Appears calm and comfortable  Respiratory system: Clear to auscultation. Respiratory effort normal. Cardiovascular  system: S1 & S2 heard, tachycardic, irreg irreg. Soft systolic murmur. 1+ pitting LE edema Gastrointestinal system: Abdomen is nondistended, soft and nontender. No organomegaly or masses felt. Normal bowel sounds heard. Central nervous system: Alert and oriented. No focal neurological deficits. Extremities: Symmetric 5 x 5 power. Skin: No rashes, lesions or ulcers Psychiatry: Judgement and insight appear normal. Mood & affect appropriate.     Data Reviewed: I have personally reviewed following labs and imaging studies  CBC: Recent Labs  Lab 03/13/20 0044  WBC 14.6*  NEUTROABS 11.7*  HGB 13.3  HCT 40.2  MCV 92.2  PLT 782   Basic Metabolic  Panel: Recent Labs  Lab 03/13/20 0044  NA 135  K 3.7  CL 106  CO2 19*  GLUCOSE 178*  BUN 35*  CREATININE 1.53*  CALCIUM 8.5*   GFR: Estimated Creatinine Clearance: 62.6 mL/min (A) (by C-G formula based on SCr of 1.53 mg/dL (H)). Liver Function Tests: Recent Labs  Lab 03/13/20 0044  AST 22  ALT 22  ALKPHOS 119  BILITOT 0.7  PROT 7.3  ALBUMIN 3.5   No results for input(s): LIPASE, AMYLASE in the last 168 hours. No results for input(s): AMMONIA in the last 168 hours. Coagulation Profile: Recent Labs  Lab 03/11/20 0812 03/13/20 0044  INR 1.4* 1.8*   Cardiac Enzymes: No results for input(s): CKTOTAL, CKMB, CKMBINDEX, TROPONINI in the last 168 hours. BNP (last 3 results) No results for input(s): PROBNP in the last 8760 hours. HbA1C: No results for input(s): HGBA1C in the last 72 hours. CBG: Recent Labs  Lab 03/06/20 0905  GLUCAP 286*   Lipid Profile: No results for input(s): CHOL, HDL, LDLCALC, TRIG, CHOLHDL, LDLDIRECT in the last 72 hours. Thyroid Function Tests: Recent Labs    03/13/20 0257  TSH 2.850   Anemia Panel: No results for input(s): VITAMINB12, FOLATE, FERRITIN, TIBC, IRON, RETICCTPCT in the last 72 hours. Urine analysis:    Component Value Date/Time   COLORURINE YELLOW (A) 04/11/2018 1114   APPEARANCEUR CLEAR (A) 04/11/2018 1114   APPEARANCEUR CLEAR 03/02/2014 1416   LABSPEC 1.011 04/11/2018 1114   LABSPEC 1.005 03/02/2014 1416   PHURINE 5.0 04/11/2018 1114   GLUCOSEU 50 (A) 04/11/2018 1114   GLUCOSEU 300 mg/dL 03/02/2014 1416   HGBUR NEGATIVE 04/11/2018 [place] 04/11/2018 1114   BILIRUBINUR NEGATIVE 03/02/2014 1416   KETONESUR NEGATIVE 04/11/2018 1114   PROTEINUR NEGATIVE 04/11/2018 1114   NITRITE NEGATIVE 04/11/2018 1114   LEUKOCYTESUR NEGATIVE 04/11/2018 1114   LEUKOCYTESUR NEGATIVE 03/02/2014 1416   Sepsis Labs: @LABRCNTIP (procalcitonin:4,lacticidven:4)  ) Recent Results (from the past 240 hour(s))  SARS  CORONAVIRUS 2 (TAT 6-24 HRS) Nasopharyngeal Nasopharyngeal Swab     Status: None   Collection Time: 03/04/20  8:45 AM   Specimen: Nasopharyngeal Swab  Result Value Ref Range Status   SARS Coronavirus 2 NEGATIVE NEGATIVE Final    Comment: (NOTE) SARS-CoV-2 target nucleic acids are NOT DETECTED.  The SARS-CoV-2 RNA is generally detectable in upper and lower respiratory specimens during the acute phase of infection. Negative results do not preclude SARS-CoV-2 infection, do not rule out co-infections with other pathogens, and should not be used as the sole basis for treatment or other patient management decisions. Negative results must be combined with clinical observations, patient history, and epidemiological information. The expected result is Negative.  Fact Sheet for Patients: SugarRoll.be  Fact Sheet for Healthcare Providers: https://www.woods-mathews.com/  This test is not yet approved or cleared by the [place] FDA  and  has been authorized for detection and/or diagnosis of SARS-CoV-2 by FDA under an Emergency Use Authorization (EUA). This EUA will remain  in effect (meaning this test can be used) for the duration of the COVID-19 declaration under Se ction 564(b)(1) of the Act, 21 U.S.C. section 360bbb-3(b)(1), unless the authorization is terminated or revoked sooner.  Performed at [facility], [place] [address] [place], [place] [address]   Resp Panel by RT-PCR (Flu A&B, Covid) Nasopharyngeal Swab     Status: None   Collection Time: 03/13/20  2:57 AM   Specimen: Nasopharyngeal Swab; Nasopharyngeal(NP) swabs in vial transport medium  Result Value Ref Range Status   SARS Coronavirus 2 by RT PCR NEGATIVE NEGATIVE Final    Comment: (NOTE) SARS-CoV-2 target nucleic acids are NOT DETECTED.  The SARS-CoV-2 RNA is generally detectable in upper respiratory specimens during the acute phase of infection. The lowest concentration of  SARS-CoV-2 viral copies this assay can detect is 138 copies/mL. A negative result does not preclude SARS-Cov-2 infection and should not be used as the sole basis for treatment or other patient management decisions. A negative result may occur with  improper specimen collection/handling, submission of specimen other than nasopharyngeal swab, presence of viral mutation(s) within the areas targeted by this assay, and inadequate number of viral copies(<138 copies/mL). A negative result must be combined with clinical observations, patient history, and epidemiological information. The expected result is Negative.  Fact Sheet for Patients:  EntrepreneurPulse.com.au  Fact Sheet for Healthcare Providers:  IncredibleEmployment.be  This test is no t yet approved or cleared by the [place] FDA and  has been authorized for detection and/or diagnosis of SARS-CoV-2 by FDA under an Emergency Use Authorization (EUA). This EUA will remain  in effect (meaning this test can be used) for the duration of the COVID-19 declaration under Section 564(b)(1) of the Act, 21 U.S.C.section 360bbb-3(b)(1), unless the authorization is terminated  or revoked sooner.       Influenza A by PCR NEGATIVE NEGATIVE Final   Influenza B by PCR NEGATIVE NEGATIVE Final    Comment: (NOTE) The Xpert Xpress SARS-CoV-2/FLU/RSV plus assay is intended as an aid in the diagnosis of influenza from Nasopharyngeal swab specimens and should not be used as a sole basis for treatment. Nasal washings and aspirates are unacceptable for Xpert Xpress SARS-CoV-2/FLU/RSV testing.  Fact Sheet for Patients: EntrepreneurPulse.com.au  Fact Sheet for Healthcare Providers: IncredibleEmployment.be  This test is not yet approved or cleared by the [place] FDA and has been authorized for detection and/or diagnosis of SARS-CoV-2 by FDA under an Emergency Use  Authorization (EUA). This EUA will remain in effect (meaning this test can be used) for the duration of the COVID-19 declaration under Section 564(b)(1) of the Act, 21 U.S.C. section 360bbb-3(b)(1), unless the authorization is terminated or revoked.  Performed at [facility], [address], [place], [place] [address]          Radiology Studies: DG Chest 2 View  Result Date: 03/13/2020 CLINICAL DATA:  AFib EXAM: CHEST - 2 VIEW COMPARISON:  Aug 17, 2017 FINDINGS: The heart size is stable. Aortic calcifications are noted. The patient's trachea is essentially midline. There is a stable calcified granuloma in the left upper lobe. There is scarring atelectasis at the lung bases. There is no large pleural effusion. There are degenerative changes of the spine. There is no acute displaced fracture. IMPRESSION: No active cardiopulmonary disease. Electronically Signed   By: Jamie Kato.D.  On: 03/13/2020 01:11        Scheduled Meds: . insulin aspart  0-20 Units Subcutaneous TID WC  . insulin aspart  0-5 Units Subcutaneous QHS   Continuous Infusions: . diltiazem (CARDIZEM) infusion 15 mg/hr (03/13/20 0422)     LOS: 0 days    Time spent: 40 min    Desma Maxim, MD [facility]   If 7PM-7AM, please contact night-coverage [email or web address] Password [facility] 03/13/2020, 7:28 AM

## 2020-03-13 NOTE — ED Provider Notes (Signed)
[facility] Emergency Department Provider Note  Time seen: 12:59 AM  I have reviewed the triage vital signs and the nursing notes.   HISTORY  Chief Complaint Atrial Fibrillation   HPI Douglas Hunter is a 64 y.o. male with a past medical history of atrial fibrillation on Coumadin, gastric reflux, hypertension, hyperlipidemia, presents to the emergency department for near syncopal episode.  According to the patient he has been having issues with his atrial fibrillation over the past 1 week or so has increased his metoprolol without success at home.  Patient follows up with Dr. Rockey Situ for cardiology.  Patient found by EMS to be in atrial fibrillation rapid ventricular response rate 160 to 170 bpm.  Was given 5 mg of IV metoprolol.  Patient remains in atrial fibrillation with rapid ventricular response around 150 bpm in the emergency department.  Patient denies any chest pain at any point.  States intermittent mild shortness of breath and his pulse rate increases that high but denies any shortness of breath otherwise.  Denies any fever cough congestion.  Patient is vaccinated against COVID-19.   Past Medical History:  Diagnosis Date  . Atrial flutter ([place])   . Coronary artery disease 08/2011   a. NSTEMI 08/2011, LHC w/ severe disease of LCx with good collaterals, lesion was high-risk for intervention due to a steep angulation of the LCx coming from the left main coronary artery as well as heavy calcifications, discused at cath conference w/ consensus advising med Rx  . GERD (gastroesophageal reflux disease)   . Hyperlipidemia   . Hypertension   . Myocardial infarction ([place])   . Paroxysmal atrial fibrillation ([place])    a. s/p ablation 01/2013; b. prn amio/dilt; c. CHADS2VASc => 3 (HTN, DM, vascular disease); d. on Coumadin  . Poorly controlled diabetes mellitus ([place])   . RBBB   . Sleep apnea     Patient Active Problem List   Diagnosis Date Noted  . Unstable angina  ([place]) 03/06/2020  . Cervical radiculopathy 04/18/2018  . Obesity, unspecified 07/19/2017  . Paroxysmal atrial fibrillation (HCC)   . Poorly controlled diabetes mellitus ([place])   . Special screening for malignant neoplasms, colon   . Polyp of sigmoid colon   . Benign neoplasm of ascending colon   . Encounter for therapeutic drug monitoring 01/13/2016  . Erectile dysfunction 12/10/2015  . Gastroesophageal reflux disease without esophagitis 12/03/2015  . Snoring 01/23/2013  . Atrial flutter ([place]) 08/20/2012  . Chest pain 08/19/2011  . HTN (hypertension) 08/19/2011  . HLD (hyperlipidemia) 08/19/2011  . Coronary artery disease 08/03/2011    Past Surgical History:  Procedure Laterality Date  . ABLATION  01/29/2013   PVI and CTI by Dr Rayann Heman for atrial flutter and paroxysmal atrial fibrillation  . ANTERIOR CERVICAL DECOMP/DISCECTOMY FUSION N/A 04/18/2018   Procedure: ANTERIOR CERVICAL DECOMPRESSION/DISCECTOMY FUSION 2 LEVELS C5-7;  Surgeon: Meade Maw, MD;  Location: [facility] ORS;  Service: Neurosurgery;  Laterality: N/A;  . ATRIAL FIBRILLATION ABLATION N/A 01/29/2013   Procedure: ATRIAL FIBRILLATION ABLATION;  Surgeon: Coralyn Mark, MD;  Location: [facility] CATH LAB;  Service: Cardiovascular;  Laterality: N/A;  . CARDIAC CATHETERIZATION  08/03/2011  . COLONOSCOPY WITH PROPOFOL N/A 02/02/2016   Procedure: COLONOSCOPY WITH PROPOFOL;  Surgeon: Lucilla Lame, MD;  Location: [facility] ENDOSCOPY;  Service: Endoscopy;  Laterality: N/A;  . LUMBAR LAMINECTOMY/DECOMPRESSION MICRODISCECTOMY N/A 06/07/2017   Procedure: LUMBAR LAMINECTOMY/DECOMPRESSION MICRODISCECTOMY 2 LEVELS-L3-4,L4-5;  Surgeon: Meade Maw, MD;  Location: [facility] ORS;  Service: Neurosurgery;  Laterality: N/A;  .  RIGHT/LEFT HEART CATH AND CORONARY ANGIOGRAPHY N/A 03/06/2020   Procedure: RIGHT/LEFT HEART CATH AND CORONARY ANGIOGRAPHY;  Surgeon: Minna Merritts, MD;  Location: [facility] CV LAB;  Service: Cardiovascular;  Laterality: N/A;   . TEE WITHOUT CARDIOVERSION N/A 01/28/2013   Procedure: TRANSESOPHAGEAL ECHOCARDIOGRAM (TEE);  Surgeon: Lelon Perla, MD;  Location: [facility] ENDOSCOPY;  Service: Cardiovascular;  Laterality: N/A;    Prior to Admission medications   Medication Sig Start Date End Date Taking? Authorizing Provider  acetaminophen (TYLENOL) 500 MG tablet Take 500 mg by mouth every 6 (six) hours as needed for mild pain or moderate pain.     [provider]  atorvastatin (LIPITOR) 80 MG tablet Take 80 mg by mouth at bedtime.    [provider]  benazepril (LOTENSIN) 40 MG tablet Take 1 tablet (40 mg total) by mouth daily. 11/20/19   Loel Dubonnet, NP  cholecalciferol (VITAMIN D3) 25 MCG (1000 UNIT) tablet Take 1,000 Units by mouth daily.    [provider]  ezetimibe (ZETIA) 10 MG tablet TAKE 1 TABLET(10 MG) BY MOUTH DAILY Patient taking differently: Take 10 mg by mouth at bedtime.  05/28/19   Minna Merritts, MD  furosemide (LASIX) 20 MG tablet Take 1 tablet (20 mg total) by mouth 2 (two) times daily as needed. Patient taking differently: Take 20 mg by mouth 2 (two) times daily.  11/20/19   Loel Dubonnet, NP  insulin glargine (LANTUS) 100 UNIT/ML injection Inject 65 Units into the skin daily.    [provider]  insulin lispro (HUMALOG) 100 UNIT/ML KiwkPen Inject 10-16 Units into the skin 3 (three) times daily with meals. Sliding Scale 12/13/17 05/27/28  [provider]  INSULIN SYRINGE 1CC/29G 29G X 1/2" 1 ML MISC USE AS DIRECTED 05/27/15   Kathrine Haddock, NP  isosorbide mononitrate (IMDUR) 30 MG 24 hr tablet Take 1 tablet (30 mg total) by mouth daily. 11/20/19   Loel Dubonnet, NP  methimazole (TAPAZOLE) 10 MG tablet Take 2 tablets (20 mg total) by mouth 2 (two) times daily. 02/20/20   Marrianne Mood D, PA-C  metoprolol tartrate (LOPRESSOR) 25 MG tablet Take one tablet twice daily. Take an additional tablet as needed for palpitations, atrial fibrillation. Patient  taking differently: Take 25 mg by mouth 2 (two) times daily. Take an additional tablet as needed for palpitations, atrial fibrillation. 01/01/20   Loel Dubonnet, NP  nitroGLYCERIN (NITROSTAT) 0.4 MG SL tablet Place 1 tablet (0.4 mg total) under the tongue every 5 (five) minutes as needed for chest pain. 05/28/19   Minna Merritts, MD  ONE TOUCH ULTRA TEST test strip TEST THREE TIMES A DAY AS DIRECTED. 02/04/15   Kathrine Haddock, NP  VIAGRA 100 MG tablet TAKE 1 TABLET BY MOUTH EVERY DAY AS NEEDED Patient taking differently: Take 100 mg by mouth as needed for erectile dysfunction.  10/28/15   Minna Merritts, MD  warfarin (COUMADIN) 5 MG tablet TAKE 1 TABLET BY MOUTH EVERY DAY AS DIRECTED BY COUMADIN CLINIC Patient taking differently: Take 2.5 mg by mouth one time only at 4 PM.  02/10/20   Gollan, Kathlene November, MD    No Known Allergies  Family History  Problem Relation Age of Onset  . Heart attack Maternal Uncle   . Heart attack Father 39  . Cancer Father        throat  . Stroke Mother   . Heart disease Mother        pacemaker  .  Heart disease Brother        heart murmur  . Diabetes Son   . Heart disease Paternal Grandfather        MI    Social History Social History   Tobacco Use  . Smoking status: Former Smoker    Packs/day: 1.50    Years: 35.00    Pack years: 52.50    Types: Cigarettes    Quit date: 01/16/2008    Years since quitting: 12.1  . Smokeless tobacco: Never Used  Vaping Use  . Vaping Use: Never used  Substance Use Topics  . Alcohol use: Not Currently    Comment: occasional  . Drug use: No    Review of Systems Constitutional: Negative for fever. Cardiovascular: Positive for palpitations. Respiratory: Negative for shortness of breath. Gastrointestinal: Negative for abdominal pain, vomiting and diarrhea. Musculoskeletal: Negative for musculoskeletal complaints Neurological: Negative for headache All other ROS  negative  ____________________________________________   PHYSICAL EXAM:  VITAL SIGNS: ED Triage Vitals  Enc Vitals Group     BP 03/13/20 0029 131/87     Pulse Rate 03/13/20 0029 94     Resp 03/13/20 0029 (!) 22     Temp 03/13/20 0029 97.8 F (36.6 C)     Temp Source 03/13/20 0029 Oral     SpO2 03/13/20 0026 98 %     Weight 03/13/20 0026 258 lb (117 kg)     Height 03/13/20 0026 5\' 11"  (1.803 m)     Head Circumference --      Peak Flow --      Pain Score 03/13/20 0026 0     Pain Loc --      Pain Edu? --      Excl. in [place]? --    Constitutional: Alert and oriented. Well appearing and in no distress. Eyes: Normal exam ENT      Head: Normocephalic and atraumatic.      Mouth/Throat: Mucous membranes are moist. Cardiovascular: Irregular rhythm rate around 150 bpm.  No murmur. Respiratory: Normal respiratory effort without tachypnea nor retractions. Breath sounds are clear  Gastrointestinal: Soft and nontender. No distention.  Musculoskeletal: Nontender with normal range of motion in all extremities.  Slight lower extreme edema bilaterally. Neurologic:  Normal speech and language. No gross focal neurologic deficits  Skin:  Skin is warm, dry and intact.  Psychiatric: Mood and affect are normal.   ____________________________________________    EKG  EKG viewed and interpreted by myself shows atrial fibrillation 142 bpm with a widened QRS, normal axis, normal intervals, nonspecific ST changes.  No ST elevation.  ____________________________________________    RADIOLOGY  Chest x-ray is negative  ____________________________________________   INITIAL IMPRESSION / ASSESSMENT AND PLAN / ED COURSE  Pertinent labs & imaging results that were available during my care of the patient were reviewed by me and considered in my medical decision making (see chart for details).   Patient presents in the emergency department for near syncopal episode found to be in atrial fibrillation  with rapid ventricular response.  Patient currently has a heart rate between 140 160 bpm.  We will check labs, and gently IV hydrate and dose 10 mg of IV diltiazem.  Patient agreeable plan of care.  Denies any chest pain we will check a troponin as a precaution.  Initial improvement of atrial fibrillation with rapid ventricular response with IV diltiazem.  Rate dropped approximately 100 bpm briefly however once again increased to greater then 150 bpm.  We will start  the patient on diltiazem infusion.  Patient will be admitted to the hospitalist for further work-up and treatment.  Troponin is mildly elevated at 72, we will recheck a troponin highly suspect demand ischemia.  Reyli Schroth Carrigg was evaluated in Emergency Department on 03/13/2020 for the symptoms described in the history of present illness. He was evaluated in the context of the global COVID-19 pandemic, which necessitated consideration that the patient might be at risk for infection with the SARS-CoV-2 virus that causes COVID-19. Institutional protocols and algorithms that pertain to the evaluation of patients at risk for COVID-19 are in a state of rapid change based on information released by regulatory bodies including the CDC and federal and state organizations. These policies and algorithms were followed during the patient's care in the ED.  CRITICAL CARE Performed by: Harvest Dark   Total critical care time: 30 minutes  Critical care time was exclusive of separately billable procedures and treating other patients.  Critical care was necessary to treat or prevent imminent or life-threatening deterioration.  Critical care was time spent personally by me on the following activities: development of treatment plan with patient and/or surrogate as well as nursing, discussions with consultants, evaluation of patient's response to treatment, examination of patient, obtaining history from patient or surrogate, ordering and performing  treatments and interventions, ordering and review of laboratory studies, ordering and review of radiographic studies, pulse oximetry and re-evaluation of patient's condition.   ____________________________________________   FINAL CLINICAL IMPRESSION(S) / ED DIAGNOSES  Atrial fibrillation with rapid ventricular response   Harvest Dark, MD 03/13/20 [phone]

## 2020-03-13 NOTE — Discharge Summary (Signed)
Douglas Hunter DXI:[id number] DOB: 1955-11-11 DOA: 03/13/2020  PCP: Douglas Hartigan, MD  Admit date: 03/13/2020 Discharge date: 03/13/2020  Time spent: 35 minutes  Recommendations for Outpatient Follow-up:  1. Close f/u with cardiology next week  2. Needs repeat bmp @ f/u    Discharge Diagnoses:  Principal Problem:   Rapid atrial fibrillation (HCC) Active Problems:   HTN (hypertension)   Coronary artery disease   Postural dizziness with presyncope   Type 2 diabetes mellitus with hyperglycemia (HCC)   AKI (acute kidney injury) ([place])   Metabolic acidosis   Elevated troponin   Atrial fibrillation with rapid ventricular response ([place])   Discharge Condition: good  Diet recommendation: low sodium heart healthy  Filed Weights   03/13/20 0026  Weight: 117 kg    History of present illness:  Douglas Riegler Faucetteis a 64 y.o.malewith medical history significant foratrial fibrillation on Coumadin, CAD, HTN, DM, who presents to the emergency room by EMS for near syncope episode. Patient has been having his metoprolol adjusted his cardiologist Dr. Rockey Hunter as an outpatient. On the night of arrival he reported acute onset of palpitations dizziness and diaphoresis. He denied chest pain or shortness of breath. Patient was otherwise in his usual state of health. No recent illness. Denies cough fever or chills, nausea or vomiting abdominal pain or diarrhea. EMS recorded heart rate in the 150s to 160s and IV metoprolol was administered  Hospital Course:  Rapid atrial fibrillation (HCC) Symptomatic with presyncope. Treated w/ dilt gtt, converted to orals. presyncope resolved and hr wnl and bp wnl. Cardiology saw, given subtherapeutic inr unable to dc cardiovert or tikosyn load. Plan to increase metoprolol to 50 bid and start diltiazem 180 qd, hold imdur, and close cardiology f/u next week. Continue warfarin and lasix.  Hyperthyroid TSH wnl - cont methimazole  AKI (acute kidney  injury) ([place]) Metabolic acidosis Creatinine 1.53, baseline around 1.2 - repeat bmp @ f/u  HTN (hypertension) bp meds as above.  Elevated troponin Coronary artery disease Recent r/l cath with obstructive CAD, no intervention. Troponin of 72>70. No chest pain, I don't see that ekg has been performed here. Suspect demand   Procedures:  none   Consultations:  cardiology  Discharge Exam: Vitals:   03/13/20 1130 03/13/20 1330  BP: 100/75 119/77  Pulse: 76 75  Resp: 18 (!) 28  Temp:    SpO2: 98% 97%    General exam: Appears calm and comfortable  Respiratory system: Clear to auscultation. Respiratory effort normal. Cardiovascular system: S1 & S2 heard, tachycardic, irreg irreg. Soft systolic murmur. 1+ pitting LE edema Gastrointestinal system: Abdomen is nondistended, soft and nontender. No organomegaly or masses felt. Normal bowel sounds heard. Central nervous system: Alert and oriented. No focal neurological deficits. Extremities: Symmetric 5 x 5 power. Skin: No rashes, lesions or ulcers Psychiatry: Judgement and insight appear normal. Mood & affect appropriate.   Discharge Instructions   Discharge Instructions    Amb referral to AFIB Clinic   Complete by: As directed    Diet - low sodium heart healthy   Complete by: As directed    Diet - low sodium heart healthy   Complete by: As directed    Increase activity slowly   Complete by: As directed    Increase activity slowly   Complete by: As directed      Allergies as of 03/13/2020   No Known Allergies     Medication List    STOP taking these medications   isosorbide mononitrate  30 MG 24 hr tablet Commonly known as: IMDUR   Viagra 100 MG tablet Generic drug: sildenafil     TAKE these medications   acetaminophen 500 MG tablet Commonly known as: TYLENOL Take 500 mg by mouth every 6 (six) hours as needed for mild pain or moderate pain.   atorvastatin 80 MG tablet Commonly known as: LIPITOR Take 80  mg by mouth at bedtime.   benazepril 40 MG tablet Commonly known as: LOTENSIN Take 1 tablet (40 mg total) by mouth daily.   cholecalciferol 25 MCG (1000 UNIT) tablet Commonly known as: VITAMIN D3 Take 1,000 Units by mouth daily.   diltiazem 180 MG 24 hr capsule Commonly known as: CARDIZEM CD Take 1 capsule (180 mg total) by mouth daily. Start taking on: March 14, 2020   ezetimibe 10 MG tablet Commonly known as: ZETIA TAKE 1 TABLET(10 MG) BY MOUTH DAILY What changed:   how much to take  how to take this  when to take this  additional instructions   furosemide 20 MG tablet Commonly known as: LASIX Take 1 tablet (20 mg total) by mouth 2 (two) times daily as needed. What changed: when to take this   insulin glargine 100 UNIT/ML injection Commonly known as: LANTUS Inject 65 Units into the skin daily.   insulin lispro 100 UNIT/ML KiwkPen Commonly known as: HUMALOG Inject 10-16 Units into the skin 3 (three) times daily with meals. Sliding Scale   INSULIN SYRINGE 1CC/29G 29G X 1/2" 1 ML Misc USE AS DIRECTED   methimazole 10 MG tablet Commonly known as: TAPAZOLE Take 2 tablets (20 mg total) by mouth 2 (two) times daily.   metoprolol tartrate 50 MG tablet Commonly known as: LOPRESSOR Take 1 tablet (50 mg total) by mouth 2 (two) times daily. What changed:   medication strength  how much to take  how to take this  when to take this  additional instructions   nitroGLYCERIN 0.4 MG SL tablet Commonly known as: NITROSTAT Place 1 tablet (0.4 mg total) under the tongue every 5 (five) minutes as needed for chest pain.   ONE TOUCH ULTRA TEST test strip Generic drug: glucose blood TEST THREE TIMES A DAY AS DIRECTED.   warfarin 5 MG tablet Commonly known as: COUMADIN Take as directed. If you are unsure how to take this medication, talk to your nurse or doctor. Original instructions: TAKE 1 TABLET BY MOUTH EVERY DAY AS DIRECTED BY COUMADIN CLINIC What changed:  See the new instructions.      No Known Allergies  Follow-up Information    Hunter, Douglas November, MD. Schedule an appointment as soon as possible for a visit.   Specialty: Cardiology Contact information: [place] [address] [phone]                The results of significant diagnostics from this hospitalization (including imaging, microbiology, ancillary and laboratory) are listed below for reference.    Significant Diagnostic Studies: DG Chest 2 View  Result Date: 03/13/2020 CLINICAL DATA:  AFib EXAM: CHEST - 2 VIEW COMPARISON:  Aug 17, 2017 FINDINGS: The heart size is stable. Aortic calcifications are noted. The patient's trachea is essentially midline. There is a stable calcified granuloma in the left upper lobe. There is scarring atelectasis at the lung bases. There is no large pleural effusion. There are degenerative changes of the spine. There is no acute displaced fracture. IMPRESSION: No active cardiopulmonary disease. Electronically Signed   By: Constance Holster  M.D.   On: 03/13/2020 01:11   CARDIAC CATHETERIZATION  Result Date: 03/06/2020  Prox LAD lesion is 40% stenosed.  Mid LAD lesion is 50% stenosed.  Prox Cx to Mid Cx lesion is 100% stenosed.  3rd Mrg lesion is 100% stenosed.  The left ventricular systolic function is normal.  LV end diastolic pressure is normal.  The left ventricular ejection fraction is 50-55% by visual estimate.  There is no mitral valve regurgitation.  There is mild aortic valve stenosis.     Microbiology: Recent Results (from the past 240 hour(s))  SARS CORONAVIRUS 2 (TAT 6-24 HRS) Nasopharyngeal Nasopharyngeal Swab     Status: None   Collection Time: 03/04/20  8:45 AM   Specimen: Nasopharyngeal Swab  Result Value Ref Range Status   SARS Coronavirus 2 NEGATIVE NEGATIVE Final    Comment: (NOTE) SARS-CoV-2 target nucleic acids are NOT DETECTED.  The SARS-CoV-2 RNA is generally detectable in upper  and lower respiratory specimens during the acute phase of infection. Negative results do not preclude SARS-CoV-2 infection, do not rule out co-infections with other pathogens, and should not be used as the sole basis for treatment or other patient management decisions. Negative results must be combined with clinical observations, patient history, and epidemiological information. The expected result is Negative.  Fact Sheet for Patients: SugarRoll.be  Fact Sheet for Healthcare Providers: https://www.woods-mathews.com/  This test is not yet approved or cleared by the [place] FDA and  has been authorized for detection and/or diagnosis of SARS-CoV-2 by FDA under an Emergency Use Authorization (EUA). This EUA will remain  in effect (meaning this test can be used) for the duration of the COVID-19 declaration under Se ction 564(b)(1) of the Act, 21 U.S.C. section 360bbb-3(b)(1), unless the authorization is terminated or revoked sooner.  Performed at [facility], [place] [address], [place], [place] [address]   Resp Panel by RT-PCR (Flu A&B, Covid) Nasopharyngeal Swab     Status: None   Collection Time: 03/13/20  2:57 AM   Specimen: Nasopharyngeal Swab; Nasopharyngeal(NP) swabs in vial transport medium  Result Value Ref Range Status   SARS Coronavirus 2 by RT PCR NEGATIVE NEGATIVE Final    Comment: (NOTE) SARS-CoV-2 target nucleic acids are NOT DETECTED.  The SARS-CoV-2 RNA is generally detectable in upper respiratory specimens during the acute phase of infection. The lowest concentration of SARS-CoV-2 viral copies this assay can detect is 138 copies/mL. A negative result does not preclude SARS-Cov-2 infection and should not be used as the sole basis for treatment or other patient management decisions. A negative result may occur with  improper specimen collection/handling, submission of specimen other than nasopharyngeal swab,  presence of viral mutation(s) within the areas targeted by this assay, and inadequate number of viral copies(<138 copies/mL). A negative result must be combined with clinical observations, patient history, and epidemiological information. The expected result is Negative.  Fact Sheet for Patients:  EntrepreneurPulse.com.au  Fact Sheet for Healthcare Providers:  IncredibleEmployment.be  This test is no t yet approved or cleared by the [place] FDA and  has been authorized for detection and/or diagnosis of SARS-CoV-2 by FDA under an Emergency Use Authorization (EUA). This EUA will remain  in effect (meaning this test can be used) for the duration of the COVID-19 declaration under Section 564(b)(1) of the Act, 21 U.S.C.section 360bbb-3(b)(1), unless the authorization is terminated  or revoked sooner.       Influenza A by PCR NEGATIVE NEGATIVE Final   Influenza B by  PCR NEGATIVE NEGATIVE Final    Comment: (NOTE) The Xpert Xpress SARS-CoV-2/FLU/RSV plus assay is intended as an aid in the diagnosis of influenza from Nasopharyngeal swab specimens and should not be used as a sole basis for treatment. Nasal washings and aspirates are unacceptable for Xpert Xpress SARS-CoV-2/FLU/RSV testing.  Fact Sheet for Patients: EntrepreneurPulse.com.au  Fact Sheet for Healthcare Providers: IncredibleEmployment.be  This test is not yet approved or cleared by the [place] FDA and has been authorized for detection and/or diagnosis of SARS-CoV-2 by FDA under an Emergency Use Authorization (EUA). This EUA will remain in effect (meaning this test can be used) for the duration of the COVID-19 declaration under Section 564(b)(1) of the Act, 21 U.S.C. section 360bbb-3(b)(1), unless the authorization is terminated or revoked.  Performed at [facility], [address] [place], [place] [address]       Labs: Basic Metabolic Panel: Recent Labs  Lab 03/13/20 0044  NA 135  K 3.7  CL 106  CO2 19*  GLUCOSE 178*  BUN 35*  CREATININE 1.53*  CALCIUM 8.5*   Liver Function Tests: Recent Labs  Lab 03/13/20 0044  AST 22  ALT 22  ALKPHOS 119  BILITOT 0.7  PROT 7.3  ALBUMIN 3.5   No results for input(s): LIPASE, AMYLASE in the last 168 hours. No results for input(s): AMMONIA in the last 168 hours. CBC: Recent Labs  Lab 03/13/20 0044  WBC 14.6*  NEUTROABS 11.7*  HGB 13.3  HCT 40.2  MCV 92.2  PLT 244   Cardiac Enzymes: No results for input(s): CKTOTAL, CKMB, CKMBINDEX, TROPONINI in the last 168 hours. BNP: BNP (last 3 results) No results for input(s): BNP in the last 8760 hours.  ProBNP (last 3 results) No results for input(s): PROBNP in the last 8760 hours.  CBG: Recent Labs  Lab 03/13/20 0743 03/13/20 1223  GLUCAP 235* 262*       Signed:  Desma Maxim MD.  [facility] 03/13/2020, 3:02 PM

## 2020-03-13 NOTE — Consult Note (Signed)
Cardiology Consult    Patient ID: Douglas Hunter MRN: [id number], DOB/AGE: 65-May-1957   Admit date: 03/13/2020 Date of Consult: 03/13/2020  Primary Physician: Sofie Hartigan, MD Primary Cardiologist: [name], MD Requesting Provider: Imagene Riches, MD  Patient Profile    Douglas Hunter is a 64 y.o. male with a history of CAD, HTN, HL, PAF s/p prior catheter ablation in 2014, amio induced hyperthyroidism, DMII, OSA, obesity, and GERD, who is being seen today for the evaluation of Afib w/ RVR at the request of Dr. Si Raider.  Past Medical History   Past Medical History:  Diagnosis Date  . AKI (acute kidney injury) ([place]) 03/13/2020  . Atrial flutter ([place])   . Coronary artery disease 08/2011   a. NSTEMI 08/2011, LHC w/ severe disease of LCx with good collaterals, lesion was high-risk for intervention due to a steep angulation of the LCx coming from the left main coronary artery as well as heavy calcifications, discused at cath conference w/ consensus advising med Rx; b. 03/2020 Cath: LM nl, LAD 8m, LCX 100p w/ R->L and L->L collats. RCA dominant, nl. EF 55%. PA 33/6/19, PCWP 8.  Marland Kitchen GERD (gastroesophageal reflux disease)   . History of echocardiogram    a. 01/2020 Echo: EF 60-65%, no rwma. Sev elev PASP. Mod dil LA.  Marland Kitchen Hyperlipidemia   . Hypertension   . Myocardial infarction ([place])   . Paroxysmal atrial fibrillation ([place])    a. s/p ablation 01/2013; b CHADS2VASc => 3 (HTN, DM, vascular disease); c. on Coumadin; d. 02/2020 Amio d/c'd 2/2 hyperthyroidism.  Marland Kitchen Poorly controlled diabetes mellitus ([place])   . RBBB   . Sleep apnea     Past Surgical History:  Procedure Laterality Date  . ABLATION  01/29/2013   PVI and CTI by Dr Rayann Heman for atrial flutter and paroxysmal atrial fibrillation  . ANTERIOR CERVICAL DECOMP/DISCECTOMY FUSION N/A 04/18/2018   Procedure: ANTERIOR CERVICAL DECOMPRESSION/DISCECTOMY FUSION 2 LEVELS C5-7;  Surgeon: Meade Maw, MD;  Location: [facility] ORS;  Service:  Neurosurgery;  Laterality: N/A;  . ATRIAL FIBRILLATION ABLATION N/A 01/29/2013   Procedure: ATRIAL FIBRILLATION ABLATION;  Surgeon: Coralyn Mark, MD;  Location: [facility] CATH LAB;  Service: Cardiovascular;  Laterality: N/A;  . CARDIAC CATHETERIZATION  08/03/2011  . COLONOSCOPY WITH PROPOFOL N/A 02/02/2016   Procedure: COLONOSCOPY WITH PROPOFOL;  Surgeon: Lucilla Lame, MD;  Location: [facility] ENDOSCOPY;  Service: Endoscopy;  Laterality: N/A;  . LUMBAR LAMINECTOMY/DECOMPRESSION MICRODISCECTOMY N/A 06/07/2017   Procedure: LUMBAR LAMINECTOMY/DECOMPRESSION MICRODISCECTOMY 2 LEVELS-L3-4,L4-5;  Surgeon: Meade Maw, MD;  Location: [facility] ORS;  Service: Neurosurgery;  Laterality: N/A;  . RIGHT/LEFT HEART CATH AND CORONARY ANGIOGRAPHY N/A 03/06/2020   Procedure: RIGHT/LEFT HEART CATH AND CORONARY ANGIOGRAPHY;  Surgeon: Minna Merritts, MD;  Location: [place] [facility] CV LAB;  Service: Cardiovascular;  Laterality: N/A;  . TEE WITHOUT CARDIOVERSION N/A 01/28/2013   Procedure: TRANSESOPHAGEAL ECHOCARDIOGRAM (TEE);  Surgeon: Lelon Perla, MD;  Location: [facility] ENDOSCOPY;  Service: Cardiovascular;  Laterality: N/A;     Allergies  No Known Allergies  History of Present Illness    64 y/o ? with a history of CAD, HTN, HL, PAF s/p prior catheter ablation in 2014, amio induced hyperthyroidism, DMII, OSA, obesity, CKD 2-3, and GERD.  Cardiac history dates back to May 2013, when he suffered a non-STEMI.  Catheterization showed severe disease in the left circumflex with collaterals and normal LV function.  The circumflex was felt to be high risk for intervention and he has been medically managed  since.  2014, he underwent catheter ablation for atrial fibrillation.  He has been chronically anticoagulated with warfarin and in the setting of recurrent arrhythmia in April 2019, required initiation of amiodarone therapy.  Treatment was limited to 100 mg daily in the setting of bradycardia and fatigue and this was subsequently  discontinued in early 2021 but then resumed in August 2021 secondary to recurrent A. fib that broke following amiodarone resumption.  Unfortunately, he developed hyperthyroidism on amiodarone therapy and this was discontinued in September.  He has since been followed by endocrinology and on methimazole therapy.  Off of amnio, he has had more frequent paroxysmal atrial fibrillation which can last hours at a time and resolves with rest and additional metoprolol (usually on 25 mg twice daily).  He has been seen by electrophysiology with recommendation for amiodarone washout and consideration for Tikosyn loading +/- redo catheter ablation.  Unfortunately, Mr. Kwasnik developed recurrent atrial fibrillation on Tuesday, December 7 with rates anywhere between 70 and 140.  He has been taking additional metoprolol for a total of 50 mg twice daily.  For the most part, he has been able to tolerate recurrent atrial fibrillation but has noticed an increase in fatigue and dyspnea.  On the evening of December 9, he had sudden presyncope and diaphoresis.  He did check his heart rates on a pulse oximeter and cell rates in the 150s.  That point, decision was made to present to the ED for further evaluation.  Here, labs were notable for leukocytosis (14.6), mild creatinine elevation at 1.53, and high-sensitivity troponin elevation (72  70).  He was placed on IV diltiazem with improved rates in the 90-120 range.  At those rates, he is asymptomatic.  He denies chest pain or dyspnea at this time.  Inpatient Medications    . atorvastatin  80 mg Oral QHS  . diltiazem  300 mg Oral Daily  . ezetimibe  10 mg Oral QHS  . insulin aspart  0-20 Units Subcutaneous TID WC  . insulin aspart  0-5 Units Subcutaneous QHS  . insulin glargine  55 Units Subcutaneous Daily  . methimazole  20 mg Oral BID  . metoprolol tartrate  50 mg Oral BID  . warfarin  3 mg Oral ONCE-1600  . Warfarin - Pharmacist Dosing Inpatient   Does not apply q69     Family History    Family History  Problem Relation Age of Onset  . Heart attack Maternal Uncle   . Heart attack Father 60  . Cancer Father        throat  . Stroke Mother   . Heart disease Mother        pacemaker  . Heart disease Brother        heart murmur  . Diabetes Son   . Heart disease Paternal Grandfather        MI   He indicated that his mother is deceased. He indicated that his father is deceased. He indicated that his sister is alive. He indicated that both of his brothers are alive. He indicated that his maternal grandmother is deceased. He indicated that his maternal grandfather is deceased. He indicated that his paternal grandmother is deceased. He indicated that his paternal grandfather is deceased. He indicated that both of his sons are alive. He indicated that his maternal uncle is alive.   Social History    Social History   Socioeconomic History  . Marital status: Married    Spouse name: Not on file  .  Number of children: Not on file  . Years of education: Not on file  . Highest education level: Not on file  Occupational History  . Not on file  Tobacco Use  . Smoking status: Former Smoker    Packs/day: 1.50    Years: 35.00    Pack years: 52.50    Types: Cigarettes    Quit date: 01/16/2008    Years since quitting: 12.1  . Smokeless tobacco: Never Used  Vaping Use  . Vaping Use: Never used  Substance and Sexual Activity  . Alcohol use: Not Currently    Comment: occasional  . Drug use: No  . Sexual activity: Yes  Other Topics Concern  . 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 +++ fatigue/malaise Cardiovascular:  No chest pain, +++ dyspnea on exertion, +++ mild LE edema, no orthopnea, +++ palpitations, no paroxysmal nocturnal dyspnea, +++ presyncope. Dermatological: No rash, lesions/masses Respiratory: No cough, +++ dyspnea Urologic: No hematuria, dysuria Abdominal:   No nausea, vomiting, diarrhea, bright red blood per rectum, melena, or hematemesis Neurologic:  No visual changes, wkns, changes in mental status. All other systems reviewed and are otherwise negative except as noted above.  Physical Exam    Blood pressure 119/77, pulse 75, temperature 97.8 F (36.6 C), temperature source Oral, resp. rate (!) 28, height 5\' 11"  (1.803 m), weight 117 kg, SpO2 97 %.  General: Pleasant, NAD Psych: Normal affect. Neuro: Alert and oriented X 3. Moves all extremities spontaneously. HEENT: Normal  Neck: Supple, obese, difficult to gauge JVP.  No bruits. Lungs:  Resp regular and unlabored, CTA. Heart: IR, IR, tachy, no s3, s4, or murmurs. Abdomen: Obese, soft, non-tender, non-distended, BS + x 4.  Extremities: No clubbing, cyanosis. Trace bilat LE edema. DP/PT2+, Radials 2+ and equal bilaterally.  Labs    Cardiac Enzymes Recent Labs  Lab 03/13/20 0044 03/13/20 0257  TROPONINIHS 72* 70*      Lab Results  Component Value Date   WBC 14.6 (H) 03/13/2020   HGB 13.3 03/13/2020   HCT 40.2 03/13/2020   MCV 92.2 03/13/2020   PLT 244 03/13/2020    Recent Labs  Lab 03/13/20 0044  NA 135  K 3.7  CL 106  CO2 19*  BUN 35*  CREATININE 1.53*  CALCIUM 8.5*  PROT 7.3  BILITOT 0.7  ALKPHOS 119  ALT 22  AST 22  GLUCOSE 178*   Lab Results  Component Value Date   CHOL 162 01/13/2015   HDL 47 01/12/2013   LDLCALC 49 01/12/2013   TRIG 62 01/13/2015    Radiology Studies    DG Chest 2 View  Result Date: 03/13/2020 CLINICAL DATA:  AFib EXAM: CHEST - 2 VIEW  COMPARISON:  Aug 17, 2017 FINDINGS: The heart size is stable. Aortic calcifications are noted. The patient's trachea is essentially midline. There is a stable calcified granuloma in the left upper lobe. There is scarring atelectasis at the lung bases. There is  no large pleural effusion. There are degenerative changes of the spine. There is no acute displaced fracture. IMPRESSION: No active cardiopulmonary disease. Electronically Signed   By: Constance Holster M.D.   On: 03/13/2020 01:11   CARDIAC CATHETERIZATION  Result Date: 03/06/2020  Prox LAD lesion is 40% stenosed.  Mid LAD lesion is 50% stenosed.  Prox Cx to Mid Cx lesion is 100% stenosed.  3rd Mrg lesion is 100% stenosed.  The left ventricular systolic function is normal.  LV end diastolic pressure is normal.  The left ventricular ejection fraction is 50-55% by visual estimate.  There is no mitral valve regurgitation.  There is mild aortic valve stenosis.     ECG & Cardiac Imaging    Afib, 142, RBBB w/ diffuse ST depression.  QT 352, QTc 542 in setting of RBBB w/ QRS 154 and short RR 2/2 tachycardia. QTmodified = 352 -0.5(154) = 257.  - personally reviewed.  Assessment & Plan    1. PAF: Patient with a history of atrial fibrillation status post catheter ablation in 2014 with recent, more frequent episodic A. fib in the setting of amiodarone discontinuation and hyperthyroidism.  He has had recurrent/persistent afib since Tuesday December 7.  He inc home metop dose from 25 bid to 50 bid.  Rates 70's to 120's @ home but last night he felt acutely presyncopal and rates were measured in 150's, prompting ED eval.  Rate 142 on arrival.  On dilt gtt @ 15/hr, he's been trending 90's to 1-teens.  At current rates, he is asymptomatic.  Unfortunately, INR is subrx at least since a few days prior to his cath on 12/3.  This prevents [place] from performing DCCV and/or adding tikosyn at this time, as he will require TEE first.  Long discussion re: options  today.  Will transition IV dilt to Dilt CD 300mg  daily and resume metoprolol 50 BID now.  Provided we can achieve adequate rate control, he can likely be d/c'd today in afib. We would arrange for early INR f/u next week.  Our EP team is in the process of arranging of elective admission for tikosyn loading.  Pt prefers to get this done before the end of the year, but as we discussed, that would require TEE prior to tikosyn initiation, which could be done as outpt once INR rx.  Then following tikosyn loading, if he is still in afib, he could be cardioverted (or cardioversion could be done @ time of TEE w/ tikosyn load to follow).  If he remains poorly controlled or symptomatic today however, we will have to bridge w/ heparin this weekend and then plan TEE/DCCV Monday w/ initiation of tikosyn to follow (would require at least 3 day hosp stay for close post-dose ECG monitoring).   2.  Coronary artery disease: Status post recent catheterization revealing a chronic total occlusion of the left circumflex with left to left and right-to-left collaterals and otherwise nonobstructive disease.  Continue medical therapy with beta-blocker, statin, and Zetia therapy.  No aspirin in the setting of chronic warfarin.  3.  Essential hypertension: Pressure stable on current regimen.  Will discontinue oral nitrate in order to make room for higher dose of beta-blocker and addition of calcium channel blocker.   4.  Hyperlipidemia: Continue statin and Zetia therapy.  5.  Hypothyroidism: Secondary to amiodarone therapy which has since been discontinued.  TSH is normal this morning.  Free T4 was mildly elevated at 1.34 on November 23.  He follows with endocrine as an  outpatient.  6.  Stage II-III chronic kidney disease: Creatinine up slightly above recent baseline.  Follow.  7.  Type 2 diabetes mellitus: Management per internal medicine.  8.  Obstructive sleep apnea: Uses CPAP.  Signed, Murray Hodgkins, NP 03/13/2020,  2:08 PM  For questions or updates, please contact   Please consult [email or web address] for contact info under Cardiology/STEMI.

## 2020-03-13 NOTE — Progress Notes (Signed)
ANTICOAGULATION CONSULT NOTE - Initial Consult  Pharmacy Consult for Warfarin  Indication: atrial fibrillation  No Known Allergies  Patient Measurements: Height: 5\' 11"  (180.3 cm) Weight: 117 kg (258 lb) IBW/kg (Calculated) : 75.3  Vital Signs: Temp: 97.8 F (36.6 C) (12/10 0029) Temp Source: Oral (12/10 0029) BP: 151/87 (12/10 0930) Pulse Rate: 95 (12/10 1000)  Labs: Recent Labs    03/11/20 0812 03/13/20 0044 03/13/20 0257  HGB  --  13.3  --   HCT  --  40.2  --   PLT  --  244  --   LABPROT  --  20.2*  --   INR 1.4* 1.8*  --   CREATININE  --  1.53*  --   TROPONINIHS  --  72* 70*    Estimated Creatinine Clearance: 62.6 mL/min (A) (by C-G formula based on SCr of 1.53 mg/dL (H)).   Medical History: Past Medical History:  Diagnosis Date  . AKI (acute kidney injury) ([place]) 03/13/2020  . Atrial flutter ([place])   . Coronary artery disease 08/2011   a. NSTEMI 08/2011, LHC w/ severe disease of LCx with good collaterals, lesion was high-risk for intervention due to a steep angulation of the LCx coming from the left main coronary artery as well as heavy calcifications, discused at cath conference w/ consensus advising med Rx  . GERD (gastroesophageal reflux disease)   . Hyperlipidemia   . Hypertension   . Myocardial infarction ([place])   . Paroxysmal atrial fibrillation ([place])    a. s/p ablation 01/2013; b. prn amio/dilt; c. CHADS2VASc => 3 (HTN, DM, vascular disease); d. on Coumadin  . Poorly controlled diabetes mellitus ([place])   . RBBB   . Sleep apnea     Assessment: 64 yo male with symptomatic persistent A.Fib. Patient taking warfarin PTA. INR on admission subtherapeutic @ 1.8. Last dose reported on 12/9 @ 1800. Pharmacy has been consulted for warfarin dosing and monitoring.   According to Cardiology note on 12/8- Possible tikosyn start inpatient.   Home Regimen: Warfarin 2.5mg  daily (Patient receiving levels outpatient to determine regimen)    DATE INR DOSE 12/9 --- 5mg  12/10 1.8  Goal of Therapy:  INR 2-3 Monitor platelets by anticoagulation protocol: Yes   Plan:  Will order Warfarin 3mg  x 1 dose this afternoon.  INR order with AM labs.  Pharmacy will continue to order warfarin dose based on INR.   Pernell Dupre, PharmD, BCPS Clinical Pharmacist 03/13/2020 10:26 AM

## 2020-03-13 NOTE — ED Notes (Signed)
D/C, f/up and new RX and change in RX discussed with pt, pt and wife verbalized understanding. NAD noted. VSS.

## 2020-03-13 NOTE — ED Notes (Signed)
Cardizem stooped at this time.

## 2020-03-13 NOTE — ED Triage Notes (Signed)
Afib RVR 160-170 on ems arrival with near syncopal episode. Denies LOC or hitting head. Reports acute onset palpitations, dizziness, and diaphoresis. Pt alert and oriented on arrival denying chest pain or pressure. AFIB noted on monitor with rate between 125-157. 5mg  Metoprolol admin en route. Reports hx of ablation. Daily warfarin at home.

## 2020-03-13 NOTE — H&P (Signed)
History and Physical    Douglas Hunter EHU:[id number] DOB: 07-Sep-1955 DOA: 03/13/2020  PCP: Sofie Hartigan, MD   Patient coming from: Home  I have personally briefly reviewed patient's old medical records in [place]  Chief Complaint: Dizziness  HPI: Douglas Hunter is a 64 y.o. male with medical history significant for atrial fibrillation on Coumadin, CAD, HTN, DM, who presents to the emergency room by EMS for near syncope episode.  Patient has been having his metoprolol adjusted his cardiologist Dr. Rockey Situ as an outpatient.  On the night of arrival he reported acute onset of palpitations dizziness and diaphoresis.  He denied chest pain or shortness of breath.  Patient was otherwise in his usual state of health.  No recent illness.  Denies cough fever or chills, nausea or vomiting abdominal pain or diarrhea.  EMS recorded heart rate in the 150s to 160s and IV metoprolol was administered ED Course: Upon arrival, heart rate was 137 with otherwise normal vitals.  Blood work significant for leukocytosis of 14,000, creatinine 1.53 with bicarb of 19.  INR 1.8.  Troponin 72.  EKG as reviewed by me : Rapid A. fib Chest x-ray clear Patient was treated in the ED a diltiazem bolus with minimal improvement and subsequently started on diltiazem infusion.  Hospitalist consulted for admission  Review of Systems: As per HPI otherwise all other systems on review of systems negative.    Past Medical History:  Diagnosis Date  . AKI (acute kidney injury) ([place]) 03/13/2020  . Atrial flutter ([place])   . Coronary artery disease 08/2011   a. NSTEMI 08/2011, LHC w/ severe disease of LCx with good collaterals, lesion was high-risk for intervention due to a steep angulation of the LCx coming from the left main coronary artery as well as heavy calcifications, discused at cath conference w/ consensus advising med Rx  . GERD (gastroesophageal reflux disease)   . Hyperlipidemia   . Hypertension   .  Myocardial infarction ([place])   . Paroxysmal atrial fibrillation ([place])    a. s/p ablation 01/2013; b. prn amio/dilt; c. CHADS2VASc => 3 (HTN, DM, vascular disease); d. on Coumadin  . Poorly controlled diabetes mellitus ([place])   . RBBB   . Sleep apnea     Past Surgical History:  Procedure Laterality Date  . ABLATION  01/29/2013   PVI and CTI by Dr Rayann Heman for atrial flutter and paroxysmal atrial fibrillation  . ANTERIOR CERVICAL DECOMP/DISCECTOMY FUSION N/A 04/18/2018   Procedure: ANTERIOR CERVICAL DECOMPRESSION/DISCECTOMY FUSION 2 LEVELS C5-7;  Surgeon: Meade Maw, MD;  Location: [facility] ORS;  Service: Neurosurgery;  Laterality: N/A;  . ATRIAL FIBRILLATION ABLATION N/A 01/29/2013   Procedure: ATRIAL FIBRILLATION ABLATION;  Surgeon: Coralyn Mark, MD;  Location: [facility];  Service: Cardiovascular;  Laterality: N/A;  . CARDIAC CATHETERIZATION  08/03/2011  . COLONOSCOPY WITH PROPOFOL N/A 02/02/2016   Procedure: COLONOSCOPY WITH PROPOFOL;  Surgeon: Lucilla Lame, MD;  Location: [facility] ENDOSCOPY;  Service: Endoscopy;  Laterality: N/A;  . LUMBAR LAMINECTOMY/DECOMPRESSION MICRODISCECTOMY N/A 06/07/2017   Procedure: LUMBAR LAMINECTOMY/DECOMPRESSION MICRODISCECTOMY 2 LEVELS-L3-4,L4-5;  Surgeon: Meade Maw, MD;  Location: [facility] ORS;  Service: Neurosurgery;  Laterality: N/A;  . RIGHT/LEFT HEART CATH AND CORONARY ANGIOGRAPHY N/A 03/06/2020   Procedure: RIGHT/LEFT HEART CATH AND CORONARY ANGIOGRAPHY;  Surgeon: Minna Merritts, MD;  Location: [facility];  Service: Cardiovascular;  Laterality: N/A;  . TEE WITHOUT CARDIOVERSION N/A 01/28/2013   Procedure: TRANSESOPHAGEAL ECHOCARDIOGRAM (TEE);  Surgeon: Lelon Perla, MD;  [facility];  Service: Cardiovascular;  Laterality: N/A;     reports that he quit smoking about 12 years ago. His smoking use included cigarettes. He has a 52.50 pack-year smoking history. He has never used smokeless tobacco. He reports previous alcohol use.  He reports that he does not use drugs.  No Known Allergies  Family History  Problem Relation Age of Onset  . Heart attack Maternal Uncle   . Heart attack Father 52  . Cancer Father        throat  . Stroke Mother   . Heart disease Mother        pacemaker  . Heart disease Brother        heart murmur  . Diabetes Son   . Heart disease Paternal Grandfather        MI      Prior to Admission medications   Medication Sig Start Date End Date Taking? Authorizing Provider  acetaminophen (TYLENOL) 500 MG tablet Take 500 mg by mouth every 6 (six) hours as needed for mild pain or moderate pain.     [provider]  atorvastatin (LIPITOR) 80 MG tablet Take 80 mg by mouth at bedtime.    [provider]  benazepril (LOTENSIN) 40 MG tablet Take 1 tablet (40 mg total) by mouth daily. 11/20/19   Loel Dubonnet, NP  cholecalciferol (VITAMIN D3) 25 MCG (1000 UNIT) tablet Take 1,000 Units by mouth daily.    [provider]  ezetimibe (ZETIA) 10 MG tablet TAKE 1 TABLET(10 MG) BY MOUTH DAILY Patient taking differently: Take 10 mg by mouth at bedtime.  05/28/19   Minna Merritts, MD  furosemide (LASIX) 20 MG tablet Take 1 tablet (20 mg total) by mouth 2 (two) times daily as needed. Patient taking differently: Take 20 mg by mouth 2 (two) times daily.  11/20/19   Loel Dubonnet, NP  insulin glargine (LANTUS) 100 UNIT/ML injection Inject 65 Units into the skin daily.    [provider]  insulin lispro (HUMALOG) 100 UNIT/ML KiwkPen Inject 10-16 Units into the skin 3 (three) times daily with meals. Sliding Scale 12/13/17 05/27/28  [provider]  INSULIN SYRINGE 1CC/29G 29G X 1/2" 1 ML MISC USE AS DIRECTED 05/27/15   Kathrine Haddock, NP  isosorbide mononitrate (IMDUR) 30 MG 24 hr tablet Take 1 tablet (30 mg total) by mouth daily. 11/20/19   Loel Dubonnet, NP  methimazole (TAPAZOLE) 10 MG tablet Take 2 tablets (20 mg total) by mouth 2 (two) times daily. 02/20/20    Marrianne Mood D, PA-C  metoprolol tartrate (LOPRESSOR) 25 MG tablet Take one tablet twice daily. Take an additional tablet as needed for palpitations, atrial fibrillation. Patient taking differently: Take 25 mg by mouth 2 (two) times daily. Take an additional tablet as needed for palpitations, atrial fibrillation. 01/01/20   Loel Dubonnet, NP  nitroGLYCERIN (NITROSTAT) 0.4 MG SL tablet Place 1 tablet (0.4 mg total) under the tongue every 5 (five) minutes as needed for chest pain. 05/28/19   Minna Merritts, MD  ONE TOUCH ULTRA TEST test strip TEST THREE TIMES A DAY AS DIRECTED. 02/04/15   Kathrine Haddock, NP  VIAGRA 100 MG tablet TAKE 1 TABLET BY MOUTH EVERY DAY AS NEEDED Patient taking differently: Take 100 mg by mouth as needed for erectile dysfunction.  10/28/15   Minna Merritts, MD  warfarin (COUMADIN) 5 MG tablet TAKE 1 TABLET BY MOUTH EVERY DAY AS DIRECTED BY COUMADIN CLINIC Patient  taking differently: Take 2.5 mg by mouth one time only at 4 PM.  02/10/20   Minna Merritts, MD    Physical Exam: Vitals:   03/13/20 0305 03/13/20 0307 03/13/20 0308 03/13/20 0310  BP:      Pulse: 92 66 (!) 105 71  Resp: 20 (!) 23 20 (!) 25  Temp:      TempSrc:      SpO2: 98% 96% 98% 98%  Weight:      Height:         Vitals:   03/13/20 0305 03/13/20 0307 03/13/20 0308 03/13/20 0310  BP:      Pulse: 92 66 (!) 105 71  Resp: 20 (!) 23 20 (!) 25  Temp:      TempSrc:      SpO2: 98% 96% 98% 98%  Weight:      Height:          Constitutional: Alert and oriented x 3 . Not in any apparent distress HEENT:      Head: Normocephalic and atraumatic.         Eyes: PERLA, EOMI, Conjunctivae are normal. Sclera is non-icteric.       Mouth/Throat: Mucous membranes are moist.       Neck: Supple with no signs of meningismus. Cardiovascular:  Irregularly irregular and tachycardic. No murmurs, gallops, or rubs. 2+ symmetrical distal pulses are present . No JVD. No LE edema Respiratory: Respiratory  effort normal .Lungs sounds clear bilaterally. No wheezes, crackles, or rhonchi.  Gastrointestinal: Soft, non tender, and non distended with positive bowel sounds. No rebound or guarding. Genitourinary: No CVA tenderness. Musculoskeletal: Nontender with normal range of motion in all extremities. No cyanosis, or erythema of extremities. Neurologic:  Face is symmetric. Moving all extremities. No gross focal neurologic deficits . Skin: Skin is warm, dry.  No rash or ulcers Psychiatric: Mood and affect are normal    Labs on Admission: I have personally reviewed following labs and imaging studies  CBC: Recent Labs  Lab 03/13/20 0044  WBC 14.6*  NEUTROABS 11.7*  HGB 13.3  HCT 40.2  MCV 92.2  PLT 400   Basic Metabolic Panel: Recent Labs  Lab 03/13/20 0044  NA 135  K 3.7  CL 106  CO2 19*  GLUCOSE 178*  BUN 35*  CREATININE 1.53*  CALCIUM 8.5*   GFR: Estimated Creatinine Clearance: 62.6 mL/min (A) (by C-G formula based on SCr of 1.53 mg/dL (H)). Liver Function Tests: Recent Labs  Lab 03/13/20 0044  AST 22  ALT 22  ALKPHOS 119  BILITOT 0.7  PROT 7.3  ALBUMIN 3.5   No results for input(s): LIPASE, AMYLASE in the last 168 hours. No results for input(s): AMMONIA in the last 168 hours. Coagulation Profile: Recent Labs  Lab 03/11/20 0812 03/13/20 0044  INR 1.4* 1.8*   Cardiac Enzymes: No results for input(s): CKTOTAL, CKMB, CKMBINDEX, TROPONINI in the last 168 hours. BNP (last 3 results) No results for input(s): PROBNP in the last 8760 hours. HbA1C: No results for input(s): HGBA1C in the last 72 hours. CBG: Recent Labs  Lab 03/06/20 0905  GLUCAP 286*   Lipid Profile: No results for input(s): CHOL, HDL, LDLCALC, TRIG, CHOLHDL, LDLDIRECT in the last 72 hours. Thyroid Function Tests: No results for input(s): TSH, T4TOTAL, FREET4, T3FREE, THYROIDAB in the last 72 hours. Anemia Panel: No results for input(s): VITAMINB12, FOLATE, FERRITIN, TIBC, IRON, RETICCTPCT in  the last 72 hours. Urine analysis:    Component Value Date/Time  COLORURINE YELLOW (A) 04/11/2018 1114   APPEARANCEUR CLEAR (A) 04/11/2018 1114   APPEARANCEUR CLEAR 03/02/2014 1416   LABSPEC 1.011 04/11/2018 1114   LABSPEC 1.005 03/02/2014 1416   PHURINE 5.0 04/11/2018 1114   GLUCOSEU 50 (A) 04/11/2018 1114   GLUCOSEU 300 mg/dL 03/02/2014 1416   HGBUR NEGATIVE 04/11/2018 [place] 04/11/2018 1114   BILIRUBINUR NEGATIVE 03/02/2014 1416   KETONESUR NEGATIVE 04/11/2018 1114   PROTEINUR NEGATIVE 04/11/2018 1114   NITRITE NEGATIVE 04/11/2018 1114   LEUKOCYTESUR NEGATIVE 04/11/2018 1114   LEUKOCYTESUR NEGATIVE 03/02/2014 1416    Radiological Exams on Admission: DG Chest 2 View  Result Date: 03/13/2020 CLINICAL DATA:  AFib EXAM: CHEST - 2 VIEW COMPARISON:  Aug 17, 2017 FINDINGS: The heart size is stable. Aortic calcifications are noted. The patient's trachea is essentially midline. There is a stable calcified granuloma in the left upper lobe. There is scarring atelectasis at the lung bases. There is no large pleural effusion. There are degenerative changes of the spine. There is no acute displaced fracture. IMPRESSION: No active cardiopulmonary disease. Electronically Signed   By: Constance Holster M.D.   On: 03/13/2020 01:11     Assessment/Plan 64 year old male with history of atrial fibrillation on Coumadin, CAD, HTN, DM, presenting with near syncope episode and found to be in A. Fib    Rapid atrial fibrillation (HCC)   Postural dizziness with presyncope -Postural dizziness likely related to atrial fibrillation -Continue diltiazem infusion -Continue Coumadin and adjust to goal INR 2-3 -IV fluid bolus given mild dehydration -Follow TSH.  Methimazole noted on med list -Consult cardiology     AKI (acute kidney injury) ([place])   Metabolic acidosis -Creatinine 1.53 with no history of CKD -IV hydration, monitor renal function and avoid nephrotoxins    HTN  (hypertension) -Patient normotensive on diltiazem -Resume antihypertensives as appropriate  Elevated troponin Coronary artery disease -Troponin of 72.  No chest pain and EKG nonacute -Continue Imdur, hold metoprolol is on diltiazem.  Continue atorvastatin    Type 2 diabetes mellitus with hyperglycemia (HCC) -Sliding scale insulin coverage   Elevated troponin    DVT prophylaxis: Coumadin Code Status: full code  Family Communication: Wife at bedside  disposition Plan: Back to previous home environment Consults called: Cardiology Status:At the time of admission, it appears that the appropriate admission status for this patient is INPATIENT. This is judged to be reasonable and necessary in order to provide the required intensity of service to ensure the patient's safety given the presenting symptoms, physical exam findings, and initial radiographic and laboratory data in the context of their  Comorbid conditions.   Patient requires inpatient status due to high intensity of service, high risk for further deterioration and high frequency of surveillance required.   I certify that at the point of admission it is my clinical judgment that the patient will require inpatient hospital care spanning beyond [place] MD [facility]     03/13/2020, 3:18 AM

## 2020-03-13 NOTE — ED Notes (Signed)
Pt resting comfortably at this time. Pt denies pain or any needs at this rime. Pt is A&Ox4. NAD noted. HR and BP stable, Cardizem currently infusing at this time. Wife at bedside. Call bell in reach.

## 2020-03-13 NOTE — ED Notes (Signed)
Pt resting comfortably at this time. NAD noted. 434ml of yellow colored urine emptied from urinal. Call bell in reach.

## 2020-03-13 NOTE — Discharge Instructions (Signed)

## 2020-03-16 ENCOUNTER — Other Ambulatory Visit: Payer: Self-pay

## 2020-03-16 ENCOUNTER — Ambulatory Visit (INDEPENDENT_AMBULATORY_CARE_PROVIDER_SITE_OTHER): Payer: 59

## 2020-03-16 DIAGNOSIS — I4892 Unspecified atrial flutter: Secondary | ICD-10-CM

## 2020-03-16 DIAGNOSIS — I483 Typical atrial flutter: Secondary | ICD-10-CM | POA: Diagnosis not present

## 2020-03-16 DIAGNOSIS — I480 Paroxysmal atrial fibrillation: Secondary | ICD-10-CM

## 2020-03-16 DIAGNOSIS — Z5181 Encounter for therapeutic drug level monitoring: Secondary | ICD-10-CM

## 2020-03-16 LAB — POCT INR: INR: 2.6 (ref 2.0–3.0)

## 2020-03-16 NOTE — Patient Instructions (Signed)
-   continue warfarin dosage of 1/2 tablet every day - recheck INR as scheduled

## 2020-03-17 ENCOUNTER — Ambulatory Visit: Payer: Medicare Other | Admitting: Physician Assistant

## 2020-03-18 ENCOUNTER — Ambulatory Visit (INDEPENDENT_AMBULATORY_CARE_PROVIDER_SITE_OTHER): Payer: 59 | Admitting: Cardiology

## 2020-03-18 ENCOUNTER — Encounter (HOSPITAL_COMMUNITY): Payer: Self-pay

## 2020-03-18 ENCOUNTER — Encounter: Payer: Self-pay | Admitting: Cardiology

## 2020-03-18 ENCOUNTER — Other Ambulatory Visit: Payer: Self-pay

## 2020-03-18 VITALS — BP 110/80 | HR 151 | Ht 71.0 in | Wt 255.0 lb

## 2020-03-18 DIAGNOSIS — I483 Typical atrial flutter: Secondary | ICD-10-CM | POA: Diagnosis not present

## 2020-03-18 DIAGNOSIS — I480 Paroxysmal atrial fibrillation: Secondary | ICD-10-CM

## 2020-03-18 MED ORDER — RIVAROXABAN 20 MG PO TABS: 20.0000 mg | ORAL_TABLET | Freq: Every day | ORAL | 0 refills | Status: DC

## 2020-03-18 MED ORDER — METOPROLOL TARTRATE 100 MG PO TABS: 100.0000 mg | ORAL_TABLET | Freq: Three times a day (TID) | ORAL | 2 refills | Status: DC

## 2020-03-18 NOTE — Progress Notes (Signed)
Electrophysiology Office Follow up Visit Note:    Date:  03/18/2020   ID:  Douglas Hunter, DOB 03/30/1956, MRN [id number]  PCP:  Douglas Hartigan, MD  [facility] Cardiologist:  Douglas Rogue, MD  [facility] Electrophysiologist:  Douglas Epley, MD    Interval History:    Douglas Hunter is a 64 y.o. male who presents for a follow up visit. They were last seen in clinic March 11, 2020 for persistent atrial fibrillation.  The patient continues to have episodes of symptomatic atrial fibrillation.  The patient has been able to recognize that episodes of his atrial fibrillation are starting to last longer and are coming closer together.  At her last appointment, the plan was to start dofetilide.  Unfortunately after her appointment he developed severe symptoms with atrial fibrillation and presented to the hospital.  Upon arrival to hospital his INR was subtherapeutic and so a rhythm control strategy cannot be employed.  The patient was rate controlled and discharged with follow-up in my clinic today.  Today he presents in probable atrial flutter with a ventricular rate of 151 bpm.  He is symptomatic with palpitations but is not lightheaded or dizzy.     Past Medical History:  Diagnosis Date  . AKI (acute kidney injury) ([place]) 03/13/2020  . Atrial flutter ([place])   . Coronary artery disease 08/2011   a. NSTEMI 08/2011, LHC w/ severe disease of LCx with good collaterals, lesion was high-risk for intervention due to a steep angulation of the LCx coming from the left main coronary artery as well as heavy calcifications, discused at cath conference w/ consensus advising med Rx; b. 03/2020 Cath: LM nl, LAD 44m, LCX 100p w/ R->L and L->L collats. RCA dominant, nl. EF 55%. PA 33/6/19, PCWP 8.  Marland Kitchen GERD (gastroesophageal reflux disease)   . History of echocardiogram    a. 01/2020 Echo: EF 60-65%, no rwma. Sev elev PASP. Mod dil LA.  Marland Kitchen Hyperlipidemia   . Hypertension   . Myocardial  infarction ([place])   . Paroxysmal atrial fibrillation ([place])    a. s/p ablation 01/2013; b CHADS2VASc => 3 (HTN, DM, vascular disease); c. on Coumadin; d. 12/2019 Amio d/c'd 2/2 hyperthyroidism.  Marland Kitchen Poorly controlled diabetes mellitus ([place])   . RBBB   . Sleep apnea     Past Surgical History:  Procedure Laterality Date  . ABLATION  01/29/2013   PVI and CTI by Dr Rayann Heman for atrial flutter and paroxysmal atrial fibrillation  . ANTERIOR CERVICAL DECOMP/DISCECTOMY FUSION N/A 04/18/2018   Procedure: ANTERIOR CERVICAL DECOMPRESSION/DISCECTOMY FUSION 2 LEVELS C5-7;  Surgeon: Meade Maw, MD;  Location: [facility] ORS;  Service: Neurosurgery;  Laterality: N/A;  . ATRIAL FIBRILLATION ABLATION N/A 01/29/2013   Procedure: ATRIAL FIBRILLATION ABLATION;  Surgeon: Coralyn Mark, MD;  Location: [facility] CATH LAB;  Service: Cardiovascular;  Laterality: N/A;  . CARDIAC CATHETERIZATION  08/03/2011  . COLONOSCOPY WITH PROPOFOL N/A 02/02/2016   Procedure: COLONOSCOPY WITH PROPOFOL;  Surgeon: Lucilla Lame, MD;  Location: [facility] ENDOSCOPY;  Service: Endoscopy;  Laterality: N/A;  . LUMBAR LAMINECTOMY/DECOMPRESSION MICRODISCECTOMY N/A 06/07/2017   Procedure: LUMBAR LAMINECTOMY/DECOMPRESSION MICRODISCECTOMY 2 LEVELS-L3-4,L4-5;  Surgeon: Meade Maw, MD;  Location: [facility] ORS;  Service: Neurosurgery;  Laterality: N/A;  . RIGHT/LEFT HEART CATH AND CORONARY ANGIOGRAPHY N/A 03/06/2020   Procedure: RIGHT/LEFT HEART CATH AND CORONARY ANGIOGRAPHY;  Surgeon: Minna Merritts, MD;  Location: [facility] CV LAB;  Service: Cardiovascular;  Laterality: N/A;  . TEE WITHOUT CARDIOVERSION N/A 01/28/2013   Procedure:  TRANSESOPHAGEAL ECHOCARDIOGRAM (TEE);  Surgeon: Lelon Perla, MD;  Location: [facility] ENDOSCOPY;  Service: Cardiovascular;  Laterality: N/A;    Current Medications: Current Meds  Medication Sig  . acetaminophen (TYLENOL) 500 MG tablet Take 500 mg by mouth every 6 (six) hours as needed for mild pain or moderate pain.   Marland Kitchen  atorvastatin (LIPITOR) 80 MG tablet Take 80 mg by mouth at bedtime.  . benazepril (LOTENSIN) 40 MG tablet Take 1 tablet (40 mg total) by mouth daily.  . cholecalciferol (VITAMIN D3) 25 MCG (1000 UNIT) tablet Take 1,000 Units by mouth daily.  Marland Kitchen diltiazem (CARDIZEM CD) 180 MG 24 hr capsule Take 1 capsule (180 mg total) by mouth daily.  Marland Kitchen ezetimibe (ZETIA) 10 MG tablet TAKE 1 TABLET(10 MG) BY MOUTH DAILY  . furosemide (LASIX) 20 MG tablet Take 20 mg by mouth 2 (two) times daily.  . insulin glargine (LANTUS) 100 UNIT/ML injection Inject 65 Units into the skin daily.  . insulin lispro (HUMALOG) 100 UNIT/ML KiwkPen Inject 10-16 Units into the skin 3 (three) times daily with meals. Sliding Scale  . INSULIN SYRINGE 1CC/29G 29G X 1/2" 1 ML MISC USE AS DIRECTED  . methimazole (TAPAZOLE) 10 MG tablet Take 2 tablets (20 mg total) by mouth 2 (two) times daily.  . nitroGLYCERIN (NITROSTAT) 0.4 MG SL tablet Place 1 tablet (0.4 mg total) under the tongue every 5 (five) minutes as needed for chest pain.  . ONE TOUCH ULTRA TEST test strip TEST THREE TIMES A DAY AS DIRECTED.  . [DISCONTINUED] metoprolol tartrate (LOPRESSOR) 50 MG tablet Take 1 tablet (50 mg total) by mouth 2 (two) times daily.  . [DISCONTINUED] warfarin (COUMADIN) 5 MG tablet TAKE 1 TABLET BY MOUTH EVERY DAY AS DIRECTED BY COUMADIN CLINIC (Patient taking differently: Take 2.5-5 mg by mouth daily. Take 5 mg on 12/8 & 12/9, then resume 2.5 mg daily)     Allergies:   Patient has no known allergies.   Social History   Socioeconomic History  . Marital status: Married    Spouse name: Not on file  . Number of children: Not on file  . Years of education: Not on file  . Highest education level: Not on file  Occupational History  . Not on file  Tobacco Use  . Smoking status: Former Smoker    Packs/day: 1.50    Years: 35.00    Pack years: 52.50    Types: Cigarettes    Quit date: 01/16/2008    Years since quitting: 12.1  . Smokeless tobacco:  Never Used  Vaping Use  . Vaping Use: Never used  Substance and Sexual Activity  . Alcohol use: Not Currently    Comment: occasional  . Drug use: No  . Sexual activity: Yes  Other Topics Concern  . 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  ROS:  Please see the history of present illness.    All other systems reviewed and are negative.  EKGs/Labs/Other Studies Reviewed:    The following studies were reviewed today: EKG, hospital records, prior notes  EKG:  The ekg ordered today demonstrates atrial flutter with a right bundle branch block and a ventricular rate of 151 bpm  Recent Labs: 02/20/2020: Magnesium 1.9 03/13/2020: ALT 22; BUN 35; Creatinine, Ser 1.53; Hemoglobin 13.3; Platelets 244; Potassium 3.7; Sodium 135; TSH 2.850  Recent Lipid Panel    Component Value Date/Time   CHOL 162 01/13/2015 0854   CHOL 112 01/12/2013 0013   TRIG 62 01/13/2015 0854   TRIG 80 01/12/2013 0013   HDL 47 01/12/2013 0013   VLDL 12 01/13/2015 0854   VLDL 16 01/12/2013 0013   LDLCALC 49 01/12/2013 0013    Physical Exam:    VS:  BP 110/80 (BP Location: Left Arm, Patient Position: Sitting, Cuff Size: Normal)   Pulse (!) 151   Ht 5\' 11"  (1.803 m)   Wt 255 lb (115.7 kg)   SpO2 96%   BMI 35.57 kg/m     Wt Readings from Last 3 Encounters:  03/18/20 255 lb (115.7 kg)  03/13/20 258 lb (117  kg)  03/11/20 251 lb 6.4 oz (114 kg)     GEN:  Well nourished, well developed in no acute distress HEENT: Normal NECK: No JVD; No carotid bruits LYMPHATICS: No lymphadenopathy CARDIAC: Tachycardic, no murmurs, rubs, gallops.  Warm and well perfused.  Nonedematous. RESPIRATORY:  Clear to auscultation without rales, wheezing or rhonchi  ABDOMEN: Soft, non-tender, non-distended MUSCULOSKELETAL:  No edema; No deformity  SKIN: Warm and dry NEUROLOGIC:  Alert and oriented x 3 PSYCHIATRIC:  Normal affect   ASSESSMENT:    1. Paroxysmal atrial fibrillation (HCC)    PLAN:    In order of problems listed above:  1.  Symptomatic atrial fibrillation and flutter Patient has been hospitalized recently for A. fib with RVR.  Unfortunately his INR was subtherapeutic which limits our antiarrhythmic options.  Patient tells me that he is going in and out of atrial fibrillation at home.  He would like to transition from Coumadin to rivaroxaban.  He has tolerated that in the past.  For now, we will increase his metoprolol tartrate 200 mg twice daily.  If he has prolonged episodes of symptomatic atrial fibrillation it is okay for him to take an additional 100 mg in the middle of the day.  He will stop his Coumadin today and plan to start Xarelto on Sunday.  We will plan on seeing him early January to load dofetilide.  Medication Adjustments/Labs and Tests Ordered: Current medicines are reviewed at length with the patient today.  Concerns regarding medicines are outlined above.  Orders Placed This Encounter  Procedures  . Amb Referral to AFIB Clinic  . EKG 12-Lead   Meds ordered this encounter  Medications  . rivaroxaban (XARELTO) 20 MG TABS tablet    Sig: Take 1 tablet (20 mg total) by mouth daily with supper.    Dispense:  28 tablet    Refill:  0  . metoprolol tartrate (LOPRESSOR) 100 MG tablet    Sig: Take 1 tablet (100 mg total) by mouth in the morning, at noon, and at bedtime.    Dispense:  90  tablet    Refill:  2    Dose increase     Signed, Lars Mage, MD, [facility]  03/18/2020 1:57 PM    Electrophysiology [facility]

## 2020-03-18 NOTE — Patient Instructions (Signed)
Medication Instructions:  - Your physician has recommended you make the following change in your medication:   1) INCREASE metoprolol tartrate 100 mg- take 1 tablet by mouth THREE times a day  2) STOP coumadin (warfarin) today  3) (On Sunday evening 03/22/20) START Xarelto 20 mg- take 1 tablet by mouth once daily   - you have been given samples of this today to carry you until January 2022 Lot: [id number] Exp: 11/2021 # 4 bottle given  - we will send in a prescription to your pharmacy at the beginning of January for the Xarelto - when you go to the pharmacy to get this filled the first time, please take the Free 30-day trial card with you    *If you need a refill on your cardiac medications before your next appointment, please call your pharmacy*   Lab Work: - none ordered  If you have labs (blood work) drawn today and your tests are completely normal, you will receive your results only by: Marland Kitchen MyChart Message (if you have MyChart) OR . A paper copy in the mail If you have any lab test that is abnormal or we need to change your treatment, we will call you to review the results.   Testing/Procedures: - none ordered   Follow-Up: At [facility], you and your health needs are our priority.  As part of our continuing mission to provide you with exceptional heart care, we have created designated Provider Care Teams.  These Care Teams include your primary Cardiologist (physician) and Advanced Practice Providers (APPs -  Physician Assistants and Nurse Practitioners) who all work together to provide you with the care you need, when you need it.  We recommend signing up for the patient portal called "MyChart".  Sign up information is provided on this After Visit Summary.  MyChart is used to connect with patients for Virtual Visits (Telemedicine).  Patients are able to view lab/test results, encounter notes, upcoming appointments, etc.  Non-urgent messages can be sent to your provider as  well.   To learn more about what you can do with MyChart, go to [email or web address].    Your next appointment:   3 month(s)  The format for your next appointment:   In Person  Provider:   Lars Mage, MD   Other Instructions - You have been referred to : [facility] in [place] - they will contact you to arrange for a Tikosyn appointment/ admission in about 3 weeks from 03/22/20.

## 2020-03-19 ENCOUNTER — Telehealth: Payer: Self-pay | Admitting: Pharmacist

## 2020-03-19 NOTE — Telephone Encounter (Signed)
Medication list reviewed in anticipation of upcoming Tikosyn initiation. Patient is not taking any contraindicated or QTc prolonging medications.   Potassium should be at least 4 and magnesium should be at least 2 prior to initiation of Tikosyn. Close monitoring of electrolytes should be preformed especially since patient is on furosemide.  Patient is anticoagulated on Xarelto on the appropriate dose. Please ensure that patient has not missed any anticoagulation doses in the 3 weeks prior to Tikosyn initiation.   Patient will need to be counseled to avoid use of Benadryl while on Tikosyn and in the 2-3 days prior to Tikosyn initiation.

## 2020-04-08 ENCOUNTER — Other Ambulatory Visit: Payer: Self-pay | Admitting: Family

## 2020-04-08 ENCOUNTER — Other Ambulatory Visit: Payer: Self-pay | Admitting: Cardiovascular Disease

## 2020-04-08 NOTE — Telephone Encounter (Signed)
Received refill request for Furosemide, this medication was prescribed by a historical provider. Please advise if refill is appropriate. Thank you.

## 2020-04-10 ENCOUNTER — Other Ambulatory Visit: Payer: Self-pay

## 2020-04-10 ENCOUNTER — Other Ambulatory Visit
Admission: RE | Admit: 2020-04-10 | Discharge: 2020-04-10 | Disposition: A | Discharge status: Home / Self Care | Payer: Medicare Other | Source: Ambulatory Visit | Attending: Physician Assistant | Admitting: Physician Assistant

## 2020-04-10 DIAGNOSIS — Z01812 Encounter for preprocedural laboratory examination: Secondary | ICD-10-CM | POA: Diagnosis present

## 2020-04-10 DIAGNOSIS — Z20822 Contact with and (suspected) exposure to covid-19: Secondary | ICD-10-CM | POA: Insufficient documentation

## 2020-04-10 LAB — SARS CORONAVIRUS 2 (TAT 6-24 HRS): SARS Coronavirus 2: NEGATIVE

## 2020-04-13 ENCOUNTER — Other Ambulatory Visit: Payer: Self-pay

## 2020-04-13 ENCOUNTER — Inpatient Hospital Stay (HOSPITAL_COMMUNITY)
Admission: RE | Admit: 2020-04-13 | Discharge: 2020-04-16 | DRG: 309 | Disposition: A | Discharge status: Home / Self Care | Payer: Medicare Other | Attending: Cardiology | Admitting: Cardiology

## 2020-04-13 ENCOUNTER — Ambulatory Visit (HOSPITAL_COMMUNITY)
Admission: RE | Admit: 2020-04-13 | Discharge: 2020-04-13 | Disposition: A | Discharge status: Home / Self Care | Payer: Medicare Other | Source: Ambulatory Visit | Attending: Physician Assistant | Admitting: Physician Assistant

## 2020-04-13 ENCOUNTER — Encounter (HOSPITAL_COMMUNITY): Payer: Self-pay | Admitting: Cardiology

## 2020-04-13 VITALS — BP 116/74 | HR 140 | Ht 71.0 in | Wt 259.8 lb

## 2020-04-13 DIAGNOSIS — I252 Old myocardial infarction: Secondary | ICD-10-CM

## 2020-04-13 DIAGNOSIS — K219 Gastro-esophageal reflux disease without esophagitis: Secondary | ICD-10-CM | POA: Diagnosis present

## 2020-04-13 DIAGNOSIS — I4819 Other persistent atrial fibrillation: Secondary | ICD-10-CM | POA: Diagnosis present

## 2020-04-13 DIAGNOSIS — G4733 Obstructive sleep apnea (adult) (pediatric): Secondary | ICD-10-CM | POA: Diagnosis present

## 2020-04-13 DIAGNOSIS — Z794 Long term (current) use of insulin: Secondary | ICD-10-CM | POA: Diagnosis not present

## 2020-04-13 DIAGNOSIS — I472 Ventricular tachycardia: Secondary | ICD-10-CM | POA: Diagnosis present

## 2020-04-13 DIAGNOSIS — E875 Hyperkalemia: Secondary | ICD-10-CM | POA: Diagnosis present

## 2020-04-13 DIAGNOSIS — D6869 Other thrombophilia: Secondary | ICD-10-CM | POA: Diagnosis present

## 2020-04-13 DIAGNOSIS — I480 Paroxysmal atrial fibrillation: Secondary | ICD-10-CM | POA: Diagnosis present

## 2020-04-13 DIAGNOSIS — E669 Obesity, unspecified: Secondary | ICD-10-CM | POA: Diagnosis present

## 2020-04-13 DIAGNOSIS — E785 Hyperlipidemia, unspecified: Secondary | ICD-10-CM | POA: Diagnosis present

## 2020-04-13 DIAGNOSIS — Z87891 Personal history of nicotine dependence: Secondary | ICD-10-CM | POA: Diagnosis not present

## 2020-04-13 DIAGNOSIS — I10 Essential (primary) hypertension: Secondary | ICD-10-CM | POA: Diagnosis present

## 2020-04-13 DIAGNOSIS — Z6836 Body mass index (BMI) 36.0-36.9, adult: Secondary | ICD-10-CM | POA: Diagnosis not present

## 2020-04-13 DIAGNOSIS — I4510 Unspecified right bundle-branch block: Secondary | ICD-10-CM | POA: Diagnosis present

## 2020-04-13 DIAGNOSIS — Z20822 Contact with and (suspected) exposure to covid-19: Secondary | ICD-10-CM | POA: Diagnosis present

## 2020-04-13 DIAGNOSIS — I25118 Atherosclerotic heart disease of native coronary artery with other forms of angina pectoris: Secondary | ICD-10-CM | POA: Diagnosis present

## 2020-04-13 DIAGNOSIS — Z7901 Long term (current) use of anticoagulants: Secondary | ICD-10-CM | POA: Diagnosis not present

## 2020-04-13 DIAGNOSIS — Z79899 Other long term (current) drug therapy: Secondary | ICD-10-CM

## 2020-04-13 LAB — HIV ANTIBODY (ROUTINE TESTING W REFLEX): HIV Screen 4th Generation wRfx: NONREACTIVE

## 2020-04-13 LAB — MAGNESIUM: Magnesium: 2 mg/dL (ref 1.7–2.4)

## 2020-04-13 LAB — HEMOGLOBIN A1C
Hgb A1c MFr Bld: 8.9 % — ABNORMAL HIGH (ref 4.8–5.6)
Mean Plasma Glucose: 208.73 mg/dL

## 2020-04-13 LAB — GLUCOSE, CAPILLARY
Glucose-Capillary: 209 mg/dL — ABNORMAL HIGH (ref 70–99)
Glucose-Capillary: 207 mg/dL — ABNORMAL HIGH (ref 70–99)

## 2020-04-13 LAB — BASIC METABOLIC PANEL
Anion gap: 13 (ref 5–15)
BUN: 23 mg/dL (ref 8–23)
CO2: 23 mmol/L (ref 22–32)
Calcium: 9 mg/dL (ref 8.9–10.3)
Chloride: 99 mmol/L (ref 98–111)
Creatinine, Ser: 1.53 mg/dL — ABNORMAL HIGH (ref 0.61–1.24)
GFR, Estimated: 50 mL/min — ABNORMAL LOW (ref >60)
Glucose, Bld: 211 mg/dL — ABNORMAL HIGH (ref 70–99)
Potassium: 5.5 mmol/L — ABNORMAL HIGH (ref 3.5–5.1)
Sodium: 135 mmol/L (ref 135–145)

## 2020-04-13 MED ORDER — SODIUM CHLORIDE 0.9% FLUSH
3.0000 mL | Freq: Two times a day (BID) | INTRAVENOUS | Status: DC
  Administered 2020-04-13 – 2020-04-16 (×6): 3 mL via INTRAVENOUS

## 2020-04-13 MED ORDER — SODIUM CHLORIDE 0.9% FLUSH: 3.0000 mL | INTRAVENOUS | Status: DC | PRN

## 2020-04-13 MED ORDER — EZETIMIBE 10 MG PO TABS
10.0000 mg | ORAL_TABLET | Freq: Every day | ORAL | Status: DC
Start: 2020-04-14 — End: 2020-04-16
  Administered 2020-04-14 – 2020-04-16 (×3): 10 mg via ORAL
  Filled 2020-04-13 (×3): qty 1

## 2020-04-13 MED ORDER — ATORVASTATIN CALCIUM 80 MG PO TABS
80.0000 mg | ORAL_TABLET | Freq: Every day | ORAL | Status: DC
  Administered 2020-04-13 – 2020-04-15 (×3): 80 mg via ORAL
  Filled 2020-04-13 (×3): qty 1

## 2020-04-13 MED ORDER — RIVAROXABAN 20 MG PO TABS
20.0000 mg | ORAL_TABLET | Freq: Every day | ORAL | Status: DC
Start: 2020-04-13 — End: 2020-04-16
  Administered 2020-04-13 – 2020-04-15 (×3): 20 mg via ORAL
  Filled 2020-04-13 (×3): qty 1

## 2020-04-13 MED ORDER — DILTIAZEM HCL ER COATED BEADS 180 MG PO CP24
180.0000 mg | ORAL_CAPSULE | Freq: Every day | ORAL | Status: DC
  Administered 2020-04-14: 180 mg via ORAL
  Filled 2020-04-13: qty 1

## 2020-04-13 MED ORDER — VITAMIN D 25 MCG (1000 UNIT) PO TABS
1000.0000 [IU] | ORAL_TABLET | Freq: Every day | ORAL | Status: DC
  Administered 2020-04-14 – 2020-04-16 (×3): 1000 [IU] via ORAL
  Filled 2020-04-13 (×3): qty 1

## 2020-04-13 MED ORDER — RIVAROXABAN 20 MG PO TABS: 20.0000 mg | ORAL_TABLET | Freq: Every day | ORAL | Status: DC

## 2020-04-13 MED ORDER — NITROGLYCERIN 0.4 MG SL SUBL: 0.4000 mg | SUBLINGUAL_TABLET | SUBLINGUAL | Status: DC | PRN

## 2020-04-13 MED ORDER — DOFETILIDE 500 MCG PO CAPS
500.0000 ug | ORAL_CAPSULE | Freq: Two times a day (BID) | ORAL | Status: DC
  Administered 2020-04-13 – 2020-04-16 (×6): 500 ug via ORAL
  Filled 2020-04-13 (×6): qty 1

## 2020-04-13 MED ORDER — ACETAMINOPHEN 500 MG PO TABS: 500.0000 mg | ORAL_TABLET | Freq: Four times a day (QID) | ORAL | Status: DC | PRN

## 2020-04-13 MED ORDER — METOPROLOL TARTRATE 100 MG PO TABS
100.0000 mg | ORAL_TABLET | Freq: Two times a day (BID) | ORAL | Status: DC
  Administered 2020-04-13: 100 mg via ORAL
  Filled 2020-04-13: qty 1

## 2020-04-13 MED ORDER — INSULIN GLARGINE 100 UNIT/ML ~~LOC~~ SOLN
65.0000 [IU] | Freq: Every day | SUBCUTANEOUS | Status: DC
  Administered 2020-04-14 – 2020-04-16 (×3): 65 [IU] via SUBCUTANEOUS
  Filled 2020-04-13 (×3): qty 0.65

## 2020-04-13 MED ORDER — METHIMAZOLE 10 MG PO TABS
20.0000 mg | ORAL_TABLET | Freq: Two times a day (BID) | ORAL | Status: DC
  Administered 2020-04-13 – 2020-04-16 (×6): 20 mg via ORAL
  Filled 2020-04-13 (×8): qty 2

## 2020-04-13 MED ORDER — INSULIN ASPART 100 UNIT/ML ~~LOC~~ SOLN
0.0000 [IU] | Freq: Three times a day (TID) | SUBCUTANEOUS | Status: DC
  Administered 2020-04-13: 5 [IU] via SUBCUTANEOUS
  Administered 2020-04-14: 3 [IU] via SUBCUTANEOUS

## 2020-04-13 MED ORDER — FUROSEMIDE 20 MG PO TABS
20.0000 mg | ORAL_TABLET | Freq: Two times a day (BID) | ORAL | Status: DC
  Administered 2020-04-13 – 2020-04-16 (×6): 20 mg via ORAL
  Filled 2020-04-13 (×6): qty 1

## 2020-04-13 MED ORDER — SODIUM CHLORIDE 0.9 % IV SOLN: 250.0000 mL | INTRAVENOUS | Status: DC | PRN

## 2020-04-13 MED ORDER — INSULIN LISPRO 100 UNIT/ML (KWIKPEN): 10.0000 [IU] | PEN_INJECTOR | Freq: Three times a day (TID) | SUBCUTANEOUS | Status: DC

## 2020-04-13 MED ORDER — BENAZEPRIL HCL 40 MG PO TABS
40.0000 mg | ORAL_TABLET | Freq: Every day | ORAL | Status: DC
Start: 2020-04-14 — End: 2020-04-16
  Administered 2020-04-14 – 2020-04-16 (×3): 40 mg via ORAL
  Filled 2020-04-13 (×3): qty 1

## 2020-04-13 NOTE — Progress Notes (Signed)
Primary Care Physician: Sofie Hartigan, MD Primary Cardiologist: Dr Rockey Situ Primary Electrophysiologist: Dr Quentin Ore Referring Physician: Dr Melchor Amour is a 65 y.o. male with a history of CAD s/p non-STEMI 08/2011, atrial fibrillation, atrial flutter, hypertension, hyperlipidemia, DM2, OSA (diagnosed 2018), obesity, RBBB, GERD, who presents for follow up in the [facility].  Patient is on Xarelto for a CHADS2VASC score of 4. He is s/p afib and flutter ablation in 2014 with Dr Rayann Heman. He has had recurrent symptoms of afib and has been to the ED several times in the last couple months. Seen by Dr Quentin Ore who recommended dofetilide admission. Patient is still having very frequent episodes of heart racing. He denies any missed doses of anticoagulation in the last 3 weeks. He presents for dofetilide admission today.  Today, he denies symptoms of chest pain, orthopnea, PND, lower extremity edema, dizziness, presyncope, syncope, snoring, daytime somnolence, bleeding, or neurologic sequela. The patient is tolerating medications without difficulties and is otherwise without complaint today.    Atrial Fibrillation Risk Factors:  he does have symptoms or diagnosis of sleep apnea. he is compliant with CPAP therapy. he does not have a history of rheumatic fever.  he has a BMI of Body mass index is 36.23 kg/m.Marland Kitchen Filed Weights   04/13/20 1114  Weight: 117.8 kg    Family History  Problem Relation Age of Onset  . Heart attack Maternal Uncle   . Heart attack Father 42  . Cancer Father        throat  . Stroke Mother   . Heart disease Mother        pacemaker  . Diabetes Son   . Heart disease Paternal Grandfather        MI     Atrial Fibrillation Management history:  Previous antiarrhythmic drugs: amiodarone Previous cardioversions: none Previous ablations: 01/2013 fib and flutter CHADS2VASC score: 4 Anticoagulation history: warfarin,  Xarelto   Past Medical History:  Diagnosis Date  . AKI (acute kidney injury) ([place]) 03/13/2020  . Atrial flutter ([place])   . Coronary artery disease 08/2011   a. NSTEMI 08/2011, LHC w/ severe disease of LCx with good collaterals, lesion was high-risk for intervention due to a steep angulation of the LCx coming from the left main coronary artery as well as heavy calcifications, discused at cath conference w/ consensus advising med Rx; b. 03/2020 Cath: LM nl, LAD 102m LCX 100p w/ R->L and L->L collats. RCA dominant, nl. EF 55%. PA 33/6/19, PCWP 8.  .Marland KitchenGERD (gastroesophageal reflux disease)   . History of echocardiogram    a. 01/2020 Echo: EF 60-65%, no rwma. Sev elev PASP. Mod dil LA.  .Marland KitchenHyperlipidemia   . Hypertension   . Myocardial infarction ([place]   . Paroxysmal atrial fibrillation ([place]    a. s/p ablation 01/2013; b CHADS2VASc => 3 (HTN, DM, vascular disease); c. on Coumadin; d. 12/2019 Amio d/c'd 2/2 hyperthyroidism.  .Marland KitchenPoorly controlled diabetes mellitus ([place]   . RBBB   . Sleep apnea    Past Surgical History:  Procedure Laterality Date  . ABLATION  01/29/2013   PVI and CTI by Dr ARayann Hemanfor atrial flutter and paroxysmal atrial fibrillation  . ANTERIOR CERVICAL DECOMP/DISCECTOMY FUSION N/A 04/18/2018   Procedure: ANTERIOR CERVICAL DECOMPRESSION/DISCECTOMY FUSION 2 LEVELS C5-7;  Surgeon: YMeade Maw MD;  Location: [facility] ORS;  Service: Neurosurgery;  Laterality: N/A;  . ATRIAL FIBRILLATION ABLATION N/A 01/29/2013   Procedure: ATRIAL FIBRILLATION ABLATION;  Surgeon: Coralyn Mark, MD;  Location: [facility] CATH LAB;  Service: Cardiovascular;  Laterality: N/A;  . CARDIAC CATHETERIZATION  08/03/2011  . COLONOSCOPY WITH PROPOFOL N/A 02/02/2016   Procedure: COLONOSCOPY WITH PROPOFOL;  Surgeon: Lucilla Lame, MD;  Location: [facility] ENDOSCOPY;  Service: Endoscopy;  Laterality: N/A;  . LUMBAR LAMINECTOMY/DECOMPRESSION MICRODISCECTOMY N/A 06/07/2017   Procedure: LUMBAR LAMINECTOMY/DECOMPRESSION  MICRODISCECTOMY 2 LEVELS-L3-4,L4-5;  Surgeon: Meade Maw, MD;  Location: [facility] ORS;  Service: Neurosurgery;  Laterality: N/A;  . RIGHT/LEFT HEART CATH AND CORONARY ANGIOGRAPHY N/A 03/06/2020   Procedure: RIGHT/LEFT HEART CATH AND CORONARY ANGIOGRAPHY;  Surgeon: Minna Merritts, MD;  Location: [facility] CV LAB;  Service: Cardiovascular;  Laterality: N/A;  . TEE WITHOUT CARDIOVERSION N/A 01/28/2013   Procedure: TRANSESOPHAGEAL ECHOCARDIOGRAM (TEE);  Surgeon: Lelon Perla, MD;  Location: [facility] ENDOSCOPY;  Service: Cardiovascular;  Laterality: N/A;    Current Outpatient Medications  Medication Sig Dispense Refill  . acetaminophen (TYLENOL) 500 MG tablet Take 500 mg by mouth every 6 (six) hours as needed for mild pain or moderate pain.     Marland Kitchen atorvastatin (LIPITOR) 80 MG tablet Take 80 mg by mouth at bedtime.    . benazepril (LOTENSIN) 40 MG tablet Take 1 tablet (40 mg total) by mouth daily. 30 tablet 3  . cholecalciferol (VITAMIN D3) 25 MCG (1000 UNIT) tablet Take 1,000 Units by mouth daily.    . Continuous Blood Gluc Sensor (FREESTYLE LIBRE 14 DAY SENSOR) MISC SMARTSIG:1 Kit(s) Topical Every 2 Weeks    . diltiazem (CARDIZEM CD) 180 MG 24 hr capsule Take 1 capsule (180 mg total) by mouth daily. 30 capsule 1  . ezetimibe (ZETIA) 10 MG tablet TAKE 1 TABLET(10 MG) BY MOUTH DAILY 90 tablet 3  . furosemide (LASIX) 20 MG tablet TAKE 1 TABLET(20 MG) BY MOUTH TWICE DAILY AS NEEDED 60 tablet 1  . insulin glargine (LANTUS) 100 UNIT/ML injection Inject 65 Units into the skin daily.    . insulin lispro (HUMALOG) 100 UNIT/ML KiwkPen Inject 10-16 Units into the skin 3 (three) times daily with meals. Sliding Scale    . INSULIN SYRINGE 1CC/29G 29G X 1/2" 1 ML MISC USE AS DIRECTED 100 each PRN  . methimazole (TAPAZOLE) 10 MG tablet Take 2 tablets (20 mg total) by mouth 2 (two) times daily. 120 tablet 1  . metoprolol tartrate (LOPRESSOR) 100 MG tablet Take 1 tablet (100 mg total) by mouth in the morning,  at noon, and at bedtime. 90 tablet 2  . nitroGLYCERIN (NITROSTAT) 0.4 MG SL tablet Place 1 tablet (0.4 mg total) under the tongue every 5 (five) minutes as needed for chest pain. 25 tablet 3  . ONE TOUCH ULTRA TEST test strip TEST THREE TIMES A DAY AS DIRECTED. 100 each 12  . rivaroxaban (XARELTO) 20 MG TABS tablet Take 1 tablet (20 mg total) by mouth daily with supper. 28 tablet 0   No current facility-administered medications for this encounter.   Facility-Administered Medications Ordered in Other Encounters  Medication Dose Route Frequency Provider Last Rate Last Admin  . sodium chloride flush (NS) 0.9 % injection 3 mL  3 mL Intravenous Q12H Visser, Jacquelyn D, PA-C        No Known Allergies  Social History   Socioeconomic History  . Marital status: Married    Spouse name: Not on file  . Number of children: Not on file  . Years of education: Not on file  . Highest education level: Not on file  Occupational History  .  Not on file  Tobacco Use  . Smoking status: Former Smoker    Packs/day: 1.50    Years: 35.00    Pack years: 52.50    Types: Cigarettes    Quit date: 01/16/2008    Years since quitting: 12.2  . Smokeless tobacco: Never Used  Vaping Use  . Vaping Use: Never used  Substance and Sexual Activity  . Alcohol use: Not Currently    Comment: occasional  . Drug use: No  . Sexual activity: Yes  Other Topics Concern  . Not on file  Social History Narrative   Pt lives in [place] ([place] outside of [place])   Works for a [employer] and exercise psychologist in [place] Strain: Not on [employer] Insecurity: Not on file  Transportation Needs: Not on file  Physical Activity: Not on file  Stress: Not on file  Social Connections: Not on file  Intimate Partner Violence: Not on file     ROS- All systems are reviewed and negative except as per the HPI above.  Physical Exam: Vitals:   04/13/20 1114  BP:  116/74  Pulse: (!) 140  Weight: 117.8 kg  Height: 5' 11"  (1.803 m)    GEN- The patient is well appearing obese male, alert and oriented x 3 today.   Head- normocephalic, atraumatic Eyes-  Sclera clear, conjunctiva pink Ears- hearing intact Oropharynx- clear Neck- supple  Lungs- Clear to ausculation bilaterally, normal work of breathing Heart- irregular rate and rhythm, tachycardia, no murmurs, rubs or gallops  GI- soft, NT, ND, + BS Extremities- no clubbing, cyanosis, or edema MS- no significant deformity or atrophy Skin- no rash or lesion Psych- euthymic mood, full affect Neuro- strength and sensation are intact  Wt Readings from Last 3 Encounters:  04/13/20 117.8 kg  03/18/20 115.7 kg  03/13/20 117 kg    EKG today demonstrates  Atrial flutter with predominantly 2:1 block, RBBB Vent. rate 140 BPM QRS duration 138 ms QT/QTc 344/525 ms  Echo 10/13/2 demonstrated  1. Left ventricular ejection fraction, by estimation, is 60 to 65%. The  left ventricle has normal function. The left ventricle has no regional  wall motion abnormalities. Left ventricular diastolic parameters were  normal.  2. Right ventricular systolic function is normal. The right ventricular  size is mildly enlarged. There is severely elevated pulmonary artery  systolic pressure.  3. Left atrial size was moderately dilated.  4. The mitral valve is normal in structure. No evidence of mitral valve  regurgitation. No evidence of mitral stenosis.  5. The aortic valve is grossly normal. Aortic valve regurgitation is not  visualized. No aortic stenosis is present.  6. The inferior vena cava is normal in size with greater than 50%  respiratory variability, suggesting right atrial pressure of 3 mmHg.   Epic records are reviewed at length today  CHA2DS2-VASc Score = 4  The patient's score is based upon: CHF History: No HTN History: Yes Diabetes History: Yes Stroke History: No Vascular Disease History:  Yes Age Score: 1 Gender Score: 0      ASSESSMENT AND PLAN: 1. Paroxysmal Atrial Fibrillation/atrial flutter The patient's CHA2DS2-VASc score is 4, indicating a 4.8% annual risk of stroke.   Patient wants to pursue dofetilide, aware of risk vrs benefit Aware of price of dofetilide Patient will continue on Xarelto 20 mg daily, states no missed doses No benadryl use PharmD has screened drugs and no QT prolonging drugs  on board QTc in SR 452 ms, Labs today show creatinine at 1.53, K+ 5.5 and mag 2.0, CrCl calculated at 80 mL/min Continue diltiazem 180 mg daily Patient decreased metoprolol to 50 mg BID due to bradycardia. Will increase back to 100 mg BID.  2. Secondary Hypercoagulable State (ICD10:  D68.69) The patient is at significant risk for stroke/thromboembolism based upon his CHA2DS2-VASc Score of 4.  Continue Rivaroxaban (Xarelto).   3. Obesity Body mass index is 36.23 kg/m. Lifestyle modification was discussed at length including regular exercise and weight reduction.  4. Obstructive sleep apnea The importance of adequate treatment of sleep apnea was discussed today in order to improve our ability to maintain sinus rhythm long term. Encouraged compliance with CPAP therapy.  5. CAD Stable chronic angina. Followed by Dr Rockey Situ.  6. HTN Stable, no changes today.   To be admitted later today once a bed becomes available.    [facility] [address] [place], [place] [address] [phone] 04/13/2020 11:53 AM

## 2020-04-13 NOTE — Progress Notes (Signed)
   04/13/20 1503  Assess: MEWS Score  Temp 98.3 F (36.8 C)  BP 100/77  Pulse Rate (!) 155  ECG Heart Rate (!) 155  Resp 20  Level of Consciousness Alert  SpO2 97 %  O2 Device Room Air  Assess: MEWS Score  MEWS Temp 0  MEWS Systolic 1  MEWS Pulse 3  MEWS RR 0  MEWS LOC 0  MEWS Score 4  MEWS Score Color Red  Assess: if the MEWS score is Yellow or Red  Were vital signs taken at a resting state? Yes  Focused Assessment No change from prior assessment (admitted for atrial fibrillation/TIkosyn loading)  Early Detection of Sepsis Score *See Row Information* Low  MEWS guidelines implemented *See Row Information* Yes  Treat  MEWS Interventions Escalated (See documentation below)  Pain Scale 0-10  Pain Score 0  Patients Stated Pain Goal 0  Take Vital Signs  Increase Vital Sign Frequency  Red: Q 1hr X 4 then Q 4hr X 4, if remains red, continue Q 4hrs  Escalate  MEWS: Escalate Red: discuss with charge nurse/RN and provider, consider discussing with RRT  Notify: Charge Nurse/RN  Name of Charge Nurse/RN Notified Clint Lipps  Date Charge Nurse/RN Notified 04/13/20  Time Charge Nurse/RN Notified [place]  Notify: Provider  Provider Name/Title Oda Kilts PA  Date Provider Notified 04/13/20  Time Provider Notified [phone]  Notification Type Rounds  Notification Reason Other (Comment) (provider at bedside admitting patient)  Response No new orders  Date of Provider Response 04/13/20  Time of Provider Response 1503  Document  Patient Outcome Stabilized after interventions  Progress note created (see row info) Yes

## 2020-04-13 NOTE — Plan of Care (Signed)
  Problem: Education: Goal: Knowledge of General Education information will improve Description Including pain rating scale, medication(s)/side effects and non-pharmacologic comfort measures Outcome: Progressing   Problem: Education: Goal: Knowledge of disease or condition will improve Outcome: Progressing   

## 2020-04-13 NOTE — H&P (Signed)
Primary Care Physician: Marina Goodell, MD Primary Cardiologist: Dr Mariah Milling Primary Electrophysiologist: Dr Lalla Brothers Referring Physician: Dr Angie Fava is a 65 y.o. male with a history of CAD s/p non-STEMI 08/2011, atrial fibrillation, atrial flutter, hypertension, hyperlipidemia, DM2, OSA (diagnosed 2018), obesity, RBBB, GERD, who presents for follow up in the [facility] Atrial Fibrillation [facility].  Patient is on Xarelto for a CHADS2VASC score of 4. He is s/p afib and flutter ablation in 2014 with Dr Johney Frame. He has had recurrent symptoms of afib and has been to the ED several times in the last couple months. Seen by Dr Lalla Brothers who recommended dofetilide admission. Patient is still having very frequent episodes of heart racing. He denies any missed doses of anticoagulation in the last 3 weeks. He presents for dofetilide admission today.  Today, he denies symptoms of chest pain, orthopnea, PND, lower extremity edema, dizziness, presyncope, syncope, snoring, daytime somnolence, bleeding, or neurologic sequela. The patient is tolerating medications without difficulties and is otherwise without complaint today.   He has presented for his admission without change to history or symptoms as reported above.   Atrial Fibrillation Risk Factors:  he does have symptoms or diagnosis of sleep apnea. he is compliant with CPAP therapy. he does not have a history of rheumatic fever.  he has a BMI of 36.23 kg/m2.   Family History  Problem Relation Age of Onset  . Heart attack Maternal Uncle   . Heart attack Father 72  . Cancer Father        throat  . Stroke Mother   . Heart disease Mother        pacemaker  . Diabetes Son   . Heart disease Paternal Grandfather        MI     Atrial Fibrillation Management history:  Previous antiarrhythmic drugs: amiodarone Previous cardioversions: none Previous ablations: 01/2013 fib and flutter CHADS2VASC score: 4 Anticoagulation  history: warfarin, Xarelto   Past Medical History:  Diagnosis Date  . AKI (acute kidney injury) (HCC) 03/13/2020  . Atrial flutter (HCC)   . Coronary artery disease 08/2011   a. NSTEMI 08/2011, LHC w/ severe disease of LCx with good collaterals, lesion was high-risk for intervention due to a steep angulation of the LCx coming from the left main coronary artery as well as heavy calcifications, discused at cath conference w/ consensus advising med Rx; b. 03/2020 Cath: LM nl, LAD 57m, LCX 100p w/ R->L and L->L collats. RCA dominant, nl. EF 55%. PA 33/6/19, PCWP 8.  Marland Kitchen GERD (gastroesophageal reflux disease)   . History of echocardiogram    a. 01/2020 Echo: EF 60-65%, no rwma. Sev elev PASP. Mod dil LA.  Marland Kitchen Hyperlipidemia   . Hypertension   . Myocardial infarction (HCC)   . Paroxysmal atrial fibrillation (HCC)    a. s/p ablation 01/2013; b CHADS2VASc => 3 (HTN, DM, vascular disease); c. on Coumadin; d. 12/2019 Amio d/c'd 2/2 hyperthyroidism.  Marland Kitchen Poorly controlled diabetes mellitus (HCC)   . RBBB   . Sleep apnea    Past Surgical History:  Procedure Laterality Date  . ABLATION  01/29/2013   PVI and CTI by Dr Johney Frame for atrial flutter and paroxysmal atrial fibrillation  . ANTERIOR CERVICAL DECOMP/DISCECTOMY FUSION N/A 04/18/2018   Procedure: ANTERIOR CERVICAL DECOMPRESSION/DISCECTOMY FUSION 2 LEVELS C5-7;  Surgeon: [name], MD;  Location: [facility] ORS;  Service: Neurosurgery;  Laterality: N/A;  . ATRIAL FIBRILLATION ABLATION N/A 01/29/2013   Procedure: ATRIAL FIBRILLATION ABLATION;  Surgeon: Coralyn Mark, MD;  Location: [facility] CATH LAB;  Service: Cardiovascular;  Laterality: N/A;  . CARDIAC CATHETERIZATION  08/03/2011  . COLONOSCOPY WITH PROPOFOL N/A 02/02/2016   Procedure: COLONOSCOPY WITH PROPOFOL;  Surgeon: Lucilla Lame, MD;  Location: [facility] ENDOSCOPY;  Service: Endoscopy;  Laterality: N/A;  . LUMBAR LAMINECTOMY/DECOMPRESSION MICRODISCECTOMY N/A 06/07/2017   Procedure: LUMBAR  LAMINECTOMY/DECOMPRESSION MICRODISCECTOMY 2 LEVELS-L3-4,L4-5;  Surgeon: Meade Maw, MD;  Location: [facility] ORS;  Service: Neurosurgery;  Laterality: N/A;  . RIGHT/LEFT HEART CATH AND CORONARY ANGIOGRAPHY N/A 03/06/2020   Procedure: RIGHT/LEFT HEART CATH AND CORONARY ANGIOGRAPHY;  Surgeon: Minna Merritts, MD;  Location: [facility] [place] CV LAB;  Service: Cardiovascular;  Laterality: N/A;  . TEE WITHOUT CARDIOVERSION N/A 01/28/2013   Procedure: TRANSESOPHAGEAL ECHOCARDIOGRAM (TEE);  Surgeon: Lelon Perla, MD;  Location: [facility] ENDOSCOPY;  Service: Cardiovascular;  Laterality: N/A;    Current Facility-Administered Medications  Medication Dose Route Frequency Provider Last Rate Last Admin  . 0.9 %  sodium chloride infusion  250 mL Intravenous PRN Fenton, Clint R, PA      . acetaminophen (TYLENOL) tablet 500 mg  500 mg Oral Q6H PRN Fenton, Clint R, PA      . atorvastatin (LIPITOR) tablet 80 mg  80 mg Oral QHS Fenton, Clint R, PA      . [START ON 04/14/2020] benazepril (LOTENSIN) tablet 40 mg  40 mg Oral Daily Fenton, Clint R, PA      . [START ON 04/14/2020] cholecalciferol (VITAMIN D3) tablet 1,000 Units  1,000 Units Oral Daily Fenton, Clint R, PA      . [START ON 04/14/2020] diltiazem (CARDIZEM CD) 24 hr capsule 180 mg  180 mg Oral Daily Fenton, Clint R, PA      . dofetilide (TIKOSYN) capsule 500 mcg  500 mcg Oral BID Fenton, Clint R, PA      . [START ON 04/14/2020] ezetimibe (ZETIA) tablet 10 mg  10 mg Oral Daily Fenton, Clint R, PA      . furosemide (LASIX) tablet 20 mg  20 mg Oral BID Fenton, Clint R, PA      . [START ON 04/14/2020] insulin glargine (LANTUS) injection 65 Units  65 Units Subcutaneous Daily Fenton, Clint R, PA      . methimazole (TAPAZOLE) tablet 20 mg  20 mg Oral BID Fenton, Clint R, PA      . metoprolol tartrate (LOPRESSOR) tablet 100 mg  100 mg Oral BID Fenton, Clint R, PA      . nitroGLYCERIN (NITROSTAT) SL tablet 0.4 mg  0.4 mg Sublingual Q5 min PRN Fenton, Clint R, PA      .  rivaroxaban (XARELTO) tablet 20 mg  20 mg Oral Q supper Fenton, Clint R, PA      . sodium chloride flush (NS) 0.9 % injection 3 mL  3 mL Intravenous Q12H Fenton, Clint R, PA      . sodium chloride flush (NS) 0.9 % injection 3 mL  3 mL Intravenous PRN Fenton, Clint R, PA       Facility-Administered Medications Ordered in Other Encounters  Medication Dose Route Frequency Provider Last Rate Last Admin  . sodium chloride flush (NS) 0.9 % injection 3 mL  3 mL Intravenous Q12H Visser, Jacquelyn D, PA-C        No Known Allergies  Social History   Socioeconomic History  . Marital status: Married    Spouse name: Not on file  . Number of children: Not on file  . Years of education:  Not on file  . Highest education level: Not on file  Occupational History  . Not on file  Tobacco Use  . Smoking status: Former Smoker    Packs/day: 1.50    Years: 35.00    Pack years: 52.50    Types: Cigarettes    Quit date: 01/16/2008    Years since quitting: 12.2  . Smokeless tobacco: Never Used  Vaping Use  . Vaping Use: Never used  Substance and Sexual Activity  . Alcohol use: Not Currently    Comment: occasional  . Drug use: No  . Sexual activity: Yes  Other Topics Concern  . 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    Physical Exam: Vitals:   04/13/20 1114  BP: 116/74  Pulse: (!) 140  Weight: 117.8 kg  Height: 5\' 11"  (1.803 m)     GEN- The patient is well appearing obese male, alert and oriented x 3 today.   Head-  normocephalic, atraumatic Eyes-  Sclera clear, conjunctiva pink Ears- hearing intact Oropharynx- clear Neck- supple  Lungs- Clear to ausculation bilaterally, normal work of breathing Heart- irregular rate and rhythm, tachycardia, no murmurs, rubs or gallops  GI- soft, NT, ND, + BS Extremities- no clubbing, cyanosis, or edema MS- no significant deformity or atrophy Skin- no rash or lesion Psych- euthymic mood, full affect Neuro- strength and sensation are intact  Wt Readings from Last 3 Encounters:  04/13/20 117.8 kg  03/18/20 115.7 kg  03/13/20 117 kg    EKG today demonstrates  Atrial flutter with predominantly 2:1 block, RBBB Vent. rate 140 BPM QRS duration 138 ms QT/QTc 344/525 ms  Echo 10/13/2 demonstrated  1. Left ventricular ejection fraction, by estimation, is 60 to 65%. The  left ventricle has normal function. The left ventricle has no regional  wall motion abnormalities. Left ventricular diastolic parameters were  normal.  2. Right ventricular systolic function is normal. The right ventricular  size is mildly enlarged. There is severely elevated pulmonary artery  systolic pressure.  3. Left atrial size was moderately dilated.  4. The mitral valve is normal in structure. No evidence of mitral valve  regurgitation. No evidence of mitral stenosis.  5. The aortic valve is grossly normal. Aortic valve regurgitation is not  visualized. No aortic stenosis is present.  6. The inferior vena cava is normal in size with greater than 50%  respiratory variability, suggesting right atrial pressure of 3 mmHg.   Epic records are reviewed at length today  CHA2DS2-VASc Score = 4  The patient's score is based upon: CHF History: No HTN History: Yes Diabetes History: Yes Stroke History: No Vascular Disease History: Yes Age Score: 1 Gender Score: 0      ASSESSMENT AND PLAN: 1. Paroxysmal Atrial Fibrillation/atrial flutter The patient's CHA2DS2-VASc score is 4,  indicating a 4.8% annual risk of stroke.   Patient wants to pursue dofetilide, aware of risk vrs benefit Aware of price of dofetilide Patient will continue on Xarelto  20 mg daily, states no missed doses No benadryl use PharmD has screened drugs and no QT prolonging drugs on board QTc in SR 452 ms, Labs today show creatinine at 1.53, K+ 5.5 and mag 2.0, CrCl calculated at 80 mL/min Continue diltiazem 180 mg daily Patient decreased metoprolol to 50 mg BID due to bradycardia. Will increase back to 100 mg BID. Will follow closely.   2. Secondary Hypercoagulable State (ICD10:  D68.69) The patient is at significant risk for stroke/thromboembolism based upon his CHA2DS2-VASc Score of 4.  Continue Rivaroxaban (Xarelto).   3. Obesity BMI of 36.23 kg/m2 Lifestyle modification was discussed at length including regular exercise and weight reduction.  4. Obstructive sleep apnea The importance of adequate treatment of sleep apnea was discussed today in order to improve our ability to maintain sinus rhythm long term. Encouraged compliance with CPAP therapy.  5. CAD Stable chronic angina. Followed by Dr Rockey Situ.  6. HTN Stable, No changes today.   7. HypERkalemia K 5.5 on check this am.  Follow.   Legrand Como [address] Vaughn, PA-C  04/13/2020 3:08 PM

## 2020-04-13 NOTE — Progress Notes (Addendum)
Pharmacy: Dofetilide (Tikosyn) - Initial Consult Assessment and Electrolyte Replacement  Pharmacy consulted to assist in monitoring and replacing electrolytes in this 65 y.o. male admitted on 04/13/2020 undergoing dofetilide initiation.  Assessment:  Patient Exclusion Criteria: If any screening criteria checked as "Yes", then  patient  should NOT receive dofetilide until criteria item is corrected.  If "Yes" please indicate correction plan.  YES  NO Patient  Exclusion Criteria Correction Plan   [x]   []   Baseline QTc interval is greater than or equal to 440 msec. IF above YES box checked dofetilide contraindicated unless patient has ICD; then may proceed if QTc 500-550 msec or with known ventricular conduction abnormalities may proceed with QTc 550-600 msec. QTc =  523 Corrected Qtc is right below 500 per EP. Proceeding with Tikosyn.    []   [x]   Patient is known or suspected to have a digoxin level greater than 2 ng/ml: No results found for: DIGOXIN     []   [x]   Creatinine clearance less than 20 ml/min (calculated using Cockcroft-Gault, actual body weight and serum creatinine): Estimated Creatinine Clearance: 62.6 mL/min (A) (by C-G formula based on SCr of 1.53 mg/dL (H)).     []   [x]  Patient has received drugs known to prolong the QT intervals within the last 48 hours (phenothiazines, tricyclics or tetracyclic antidepressants, erythromycin, H-1 antihistamines, cisapride, fluoroquinolones, azithromycin, ondansetron).   Updated information on QT prolonging agents is available to be searched on the following database:QT prolonging agents     []   [x]   Patient received a dose of hydrochlorothiazide (Oretic) alone or in any combination including triamterene (Dyazide, Maxzide) in the last 48 hours.    []   [x]  Patient received a medication known to increase dofetilide plasma concentrations prior to initial dofetilide dose:  . Trimethoprim (Primsol, Proloprim) in the last 36  hours . Verapamil (Calan, Verelan) in the last 36 hours or a sustained release dose in the last 72 hours . Megestrol (Megace) in the last 5 days  . Cimetidine (Tagamet) in the last 6 hours . Ketoconazole (Nizoral) in the last 24 hours . Itraconazole (Sporanox) in the last 48 hours  . Prochlorperazine (Compazine) in the last 36 hours     []   [x]   Patient is known to have a history of torsades de pointes; congenital or acquired long QT syndromes.    []   [x]   Patient has received a Class 1 antiarrhythmic with less than 2 half-lives since last dose. (Disopyramide, Quinidine, Procainamide, Lidocaine, Mexiletine, Flecainide, Propafenone)    []   [x]   Patient has received amiodarone therapy in the past 3 months or amiodarone level is greater than 0.3 ng/ml.    Patient has been appropriately anticoagulated with Xarelto.  Labs:    Component Value Date/Time   K 5.5 (H) 04/13/2020 1117   K 4.1 03/02/2014 1416   MG 2.0 04/13/2020 1117   MG 1.2 (L) 08/11/2012 1037     Plan: Potassium: K >/= 4: Appropriate to initiate Tikosyn, no replacement needed    Magnesium: -no magnesium replacement today. Will follow trend   Thank you for allowing pharmacy to participate in this patient's care   Hildred Laser, PharmD Clinical Pharmacist **Pharmacist phone directory can now be found on [email or web address] ([email or web address]).  Listed under [place].

## 2020-04-14 DIAGNOSIS — I480 Paroxysmal atrial fibrillation: Secondary | ICD-10-CM | POA: Diagnosis not present

## 2020-04-14 LAB — GLUCOSE, CAPILLARY
Glucose-Capillary: 184 mg/dL — ABNORMAL HIGH (ref 70–99)
Glucose-Capillary: 291 mg/dL — ABNORMAL HIGH (ref 70–99)
Glucose-Capillary: 224 mg/dL — ABNORMAL HIGH (ref 70–99)
Glucose-Capillary: 191 mg/dL — ABNORMAL HIGH (ref 70–99)

## 2020-04-14 LAB — BASIC METABOLIC PANEL
Anion gap: 11 (ref 5–15)
BUN: 25 mg/dL — ABNORMAL HIGH (ref 8–23)
CO2: 22 mmol/L (ref 22–32)
Calcium: 8.6 mg/dL — ABNORMAL LOW (ref 8.9–10.3)
Chloride: 104 mmol/L (ref 98–111)
Creatinine, Ser: 1.44 mg/dL — ABNORMAL HIGH (ref 0.61–1.24)
GFR, Estimated: 54 mL/min — ABNORMAL LOW (ref >60)
Glucose, Bld: 187 mg/dL — ABNORMAL HIGH (ref 70–99)
Potassium: 4.4 mmol/L (ref 3.5–5.1)
Sodium: 137 mmol/L (ref 135–145)

## 2020-04-14 LAB — MAGNESIUM: Magnesium: 2 mg/dL (ref 1.7–2.4)

## 2020-04-14 MED ORDER — METOPROLOL TARTRATE 25 MG PO TABS
25.0000 mg | ORAL_TABLET | Freq: Two times a day (BID) | ORAL | Status: DC
  Administered 2020-04-14: 25 mg via ORAL
  Filled 2020-04-14: qty 1

## 2020-04-14 MED ORDER — MAGNESIUM SULFATE 2 GM/50ML IV SOLN
2.0000 g | Freq: Once | INTRAVENOUS | Status: AC
  Administered 2020-04-14: 2 g via INTRAVENOUS
  Filled 2020-04-14: qty 50

## 2020-04-14 MED ORDER — INSULIN ASPART 100 UNIT/ML ~~LOC~~ SOLN
0.0000 [IU] | Freq: Three times a day (TID) | SUBCUTANEOUS | Status: DC
  Administered 2020-04-14: 5 [IU] via SUBCUTANEOUS
  Administered 2020-04-14: 8 [IU] via SUBCUTANEOUS
  Administered 2020-04-15: 3 [IU] via SUBCUTANEOUS
  Administered 2020-04-15: 2 [IU] via SUBCUTANEOUS
  Administered 2020-04-15 – 2020-04-16 (×2): 5 [IU] via SUBCUTANEOUS
  Administered 2020-04-16: 3 [IU] via SUBCUTANEOUS

## 2020-04-14 MED ORDER — INSULIN ASPART 100 UNIT/ML ~~LOC~~ SOLN
8.0000 [IU] | Freq: Three times a day (TID) | SUBCUTANEOUS | Status: DC
  Administered 2020-04-14 – 2020-04-16 (×7): 8 [IU] via SUBCUTANEOUS

## 2020-04-14 NOTE — Progress Notes (Signed)
Pt converted to sinus bradycardia 30s, EKG obtained and MD notified.  Also obtained order for CPAP at HS.  Pt asymptomatic, BP WNL.  Will continue to monitor closely.

## 2020-04-14 NOTE — Progress Notes (Addendum)
Morning EKG reviewed  Shows remains in NSR at 43 bpm with stable QTc at ~470 ms (even less) when corrected for RBBB and bradycardia.  Reviewed this EKG and baseline EKG from 11/2019 with Dr. Quentin Ore personally.   Continue Tikosyn 500 mcg BID at this time.   Stop diltiazem.  Pt will not require DCCV   Annamaria Helling  Pager: [phone]  04/14/2020 12:20 PM

## 2020-04-14 NOTE — Progress Notes (Signed)
Patient places CPAP on self.  RT assistance not needed at this time. 

## 2020-04-14 NOTE — Progress Notes (Addendum)
Inpatient Diabetes Program Recommendations  AACE/ADA: New Consensus Statement on Inpatient Glycemic Control (2015)  Target Ranges:  Prepandial:   less than 140 mg/dL      Peak postprandial:   less than 180 mg/dL (1-2 hours)      Critically ill patients:  140 - 180 mg/dL   Lab Results  Component Value Date   GLUCAP 291 (H) 04/14/2020   HGBA1C 8.9 (H) 04/13/2020    Review of Glycemic Control Results for Douglas Hunter, Douglas Hunter (MRN [id number]) as of 04/14/2020 12:15  Ref. Range 04/14/2020 07:20 04/14/2020 11:38  Glucose-Capillary Latest Ref Range: 70 - 99 mg/dL 184 (H) 291 (H)   Diabetes history:  DM2 Outpatient Diabetes medications:  Lantus 65 daily & Novolog 14 units TID with meals per patient   Inpatient Diabetes Program Recommendations:    Spoke with RN as patient was concerned about post prandial and meal coverage.  He usually takes Humalog 14 units with meals.  Post prandial is 291 mg/dL at noon.  Please consider: Novolog 8 units tid with meals if eats at least 50%.  Will continue to follow while inpatient.  Thank you, Reche Dixon, RN, BSN Diabetes Coordinator Inpatient Diabetes Program [phone] (team pager from 8a-5p)

## 2020-04-14 NOTE — TOC Benefit Eligibility Note (Signed)
Transition of Care [facility]) Benefit Eligibility Note    Patient Details  Name: KREG EARHART MRN: [id number] Date of Birth: 06/06/1955   Medication/Dose: Phyllis Ginger 515mcg bid  Covered?: Yes  Tier: Other (tier 5)  Prescription Coverage [facility]erred [facility]  Spoke with Person/Company/Phone Number:: [employer]  Co-Pay: 31% of total perscription cost  Prior Approval: Yes [phone] option 3)          [place] Phone Number: 04/14/2020, 7:32 AM

## 2020-04-14 NOTE — Progress Notes (Signed)
Pharmacy: Dofetilide (Tikosyn) - Follow Up Assessment and Electrolyte Replacement  Pharmacy consulted to assist in monitoring and replacing electrolytes in this 65 y.o. male admitted on 04/13/2020 undergoing dofetilide initiation. First dofetilide dose: 04/13/20  Labs:    Component Value Date/Time   K 4.4 04/14/2020 0610   K 4.1 03/02/2014 1416   MG 2.0 04/14/2020 0610   MG 1.2 (L) 08/11/2012 1037     Plan: Potassium: K >/= 4: No additional supplementation needed  Magnesium: Mg 1.8-2: Give Mg 2 gm IV x1    No potassium supplements required.   Thank you for allowing pharmacy to participate in this patient's care   Erin Hearing PharmD., BCPS Clinical Pharmacist 04/14/2020 12:42 PM

## 2020-04-14 NOTE — Progress Notes (Signed)
Electrophysiology Rounding Note  Patient Name: Douglas Hunter Date of Encounter: 04/14/2020  Primary Cardiologist: [name], MD  Electrophysiologist: Vickie Epley, MD    Subjective   Pt converted to sinus rhythm on Tikosyn 500 mcg BID   QTc from EKG last pm shows stable QTc at ~415-420  The patient is doing well today.  At this time, the patient denies chest pain, shortness of breath, or any new concerns.  Inpatient Medications    Scheduled Meds: . atorvastatin  80 mg Oral QHS  . benazepril  40 mg Oral Daily  . cholecalciferol  1,000 Units Oral Daily  . diltiazem  180 mg Oral Daily  . dofetilide  500 mcg Oral BID  . ezetimibe  10 mg Oral Daily  . furosemide  20 mg Oral BID  . insulin aspart  0-15 Units Subcutaneous TID WC  . insulin glargine  65 Units Subcutaneous Daily  . methimazole  20 mg Oral BID  . metoprolol tartrate  25 mg Oral BID  . rivaroxaban  20 mg Oral QHS  . sodium chloride flush  3 mL Intravenous Q12H   Continuous Infusions: . sodium chloride     PRN Meds: sodium chloride, acetaminophen, nitroGLYCERIN, sodium chloride flush   Vital Signs    Vitals:   04/13/20 2350 04/14/20 0000 04/14/20 0629 04/14/20 0749  BP: 116/74  130/66 125/62  Pulse: (!) 37 (!) 37 (!) 52 82  Resp: 16 16 16 16   Temp: 98.1 F (36.7 C)  97.9 F (36.6 C) 98 F (36.7 C)  TempSrc: Oral  Oral Oral  SpO2: 97% 98% 98% 98%  Weight:   115.7 kg   Height:        Intake/Output Summary (Last 24 hours) at 04/14/2020 0751 Last data filed at 04/13/2020 2216 Gross per 24 hour  Intake 243 ml  Output --  Net 243 ml   Filed Weights   04/13/20 1503 04/14/20 0629  Weight: 117 kg 115.7 kg    Physical Exam    GEN- The patient is well appearing, alert and oriented x 3 today.   Head- normocephalic, atraumatic Eyes-  Sclera clear, conjunctiva pink Ears- hearing intact Oropharynx- clear Neck- supple Lungs- Clear to ausculation bilaterally, normal work of  breathing Heart- Regular rate and rhythm, no murmurs, rubs or gallops GI- soft, NT, ND, + BS Extremities- no clubbing, cyanosis, or edema Skin- no rash or lesion Psych- euthymic mood, full affect Neuro- strength and sensation are intact  Labs    CBC No results for input(s): WBC, NEUTROABS, HGB, HCT, MCV, PLT in the last 72 hours. Basic Metabolic Panel Recent Labs    04/13/20 1117 04/14/20 0610  NA 135 137  K 5.5* 4.4  CL 99 104  CO2 23 22  GLUCOSE 211* 187*  BUN 23 25*  CREATININE 1.53* 1.44*  CALCIUM 9.0 8.6*  MG 2.0 2.0    Potassium  Date/Time Value Ref Range Status  04/14/2020 06:10 AM 4.4 3.5 - 5.1 mmol/L Final  03/02/2014 02:16 PM 4.1 3.5 - 5.1 mmol/L Final   Magnesium  Date/Time Value Ref Range Status  04/14/2020 06:10 AM 2.0 1.7 - 2.4 mg/dL Final    Comment:    Performed at [facility], [place] [address] [place], [place] [address]  08/11/2012 10:37 AM 1.2 (L) mg/dL Final    Comment:    1.8-2.4 THERAPEUTIC RANGE: 4-7 mg/dL TOXIC: > 10 mg/dL  ----------------------- MAGNESIUM - Slight hemolysis, interpret results with  -  caution.     Telemetry    Sinus bradycardia in 30-40s overnight (personally reviewed)  Radiology    No results found.   Patient Profile     Douglas Hunter is a 65 y.o. male with a past medical history significant for persistent atrial fibrillation.  They were admitted for tikosyn load.   Assessment & Plan    1. Persistent atrial fibrillation Pt converted to sinus rhythm on Tikosyn 500 mcg BID  Continue Xarelto Electrolytes stable.  CHA2DS2VASC is at least 4.  2. Bradycardia Hold lopressor this am and decrease dosing to 25 mg BID this pm Continue diltiazem for now.   Pt has converted after first tikosyn dose, will not likely require [facility]   For questions or updates, please contact [place] Please consult [email or web address] for contact info under Cardiology/STEMI.  Signed, Shirley Friar, PA-C   04/14/2020, 7:51 AM

## 2020-04-15 ENCOUNTER — Other Ambulatory Visit: Payer: Self-pay

## 2020-04-15 DIAGNOSIS — I480 Paroxysmal atrial fibrillation: Secondary | ICD-10-CM | POA: Diagnosis not present

## 2020-04-15 LAB — BASIC METABOLIC PANEL
Anion gap: 11 (ref 5–15)
BUN: 31 mg/dL — ABNORMAL HIGH (ref 8–23)
CO2: 25 mmol/L (ref 22–32)
Calcium: 8.9 mg/dL (ref 8.9–10.3)
Chloride: 101 mmol/L (ref 98–111)
Creatinine, Ser: 1.55 mg/dL — ABNORMAL HIGH (ref 0.61–1.24)
GFR, Estimated: 49 mL/min — ABNORMAL LOW (ref >60)
Glucose, Bld: 145 mg/dL — ABNORMAL HIGH (ref 70–99)
Potassium: 4.1 mmol/L (ref 3.5–5.1)
Sodium: 137 mmol/L (ref 135–145)

## 2020-04-15 LAB — GLUCOSE, CAPILLARY
Glucose-Capillary: 126 mg/dL — ABNORMAL HIGH (ref 70–99)
Glucose-Capillary: 213 mg/dL — ABNORMAL HIGH (ref 70–99)
Glucose-Capillary: 195 mg/dL — ABNORMAL HIGH (ref 70–99)
Glucose-Capillary: 92 mg/dL (ref 70–99)

## 2020-04-15 LAB — MAGNESIUM: Magnesium: 2.5 mg/dL — ABNORMAL HIGH (ref 1.7–2.4)

## 2020-04-15 MED ORDER — METOPROLOL TARTRATE 12.5 MG HALF TABLET: 12.5000 mg | ORAL_TABLET | Freq: Two times a day (BID) | ORAL | Status: DC

## 2020-04-15 NOTE — Progress Notes (Signed)
PM EKG did not go through [facility], but in patients file.

## 2020-04-15 NOTE — Plan of Care (Signed)
  Problem: Education: Goal: Knowledge of General Education information will improve Description: Including pain rating scale, medication(s)/side effects and non-pharmacologic comfort measures Outcome: Progressing   Problem: Education: Goal: Knowledge of disease or condition will improve Outcome: Progressing Goal: Understanding of medication regimen will improve Outcome: Progressing Goal: Individualized Educational Video(s) Outcome: Progressing   Problem: Activity: Goal: Ability to tolerate increased activity will improve Outcome: Progressing   Problem: Cardiac: Goal: Ability to achieve and maintain adequate cardiopulmonary perfusion will improve Outcome: Progressing   Problem: Health Behavior/Discharge Planning: Goal: Ability to safely manage health-related needs after discharge will improve Outcome: Progressing   

## 2020-04-15 NOTE — Care Management (Signed)
1712 04-15-20 Case Manager spoke with patient regarding Tikosyn. Patient states he wants the first fill via [facility] and for refills to be e-scribed to [facility] in [place]. Patient is not sure if he will use [facility] vs his insurance. No further needs from Case Manager. Graves-Bigelow, Ocie Cornfield, RN, BSN Case Manager.

## 2020-04-15 NOTE — Progress Notes (Signed)
Patient places CPAP on self.  RT assistance not needed at this time.

## 2020-04-15 NOTE — Progress Notes (Signed)
Inpatient Diabetes Program Recommendations  AACE/ADA: New Consensus Statement on Inpatient Glycemic Control (2015)  Target Ranges:  Prepandial:   less than 140 mg/dL      Peak postprandial:   less than 180 mg/dL (1-2 hours)      Critically ill patients:  140 - 180 mg/dL   Lab Results  Component Value Date   GLUCAP 213 (H) 04/15/2020   HGBA1C 8.9 (H) 04/13/2020    Review of Glycemic Control Results for Douglas Hunter, Douglas Hunter (MRN [id number]) as of 04/15/2020 13:37  Ref. Range 04/14/2020 07:20 04/14/2020 11:38 04/14/2020 17:11 04/14/2020 21:21 04/15/2020 07:32 04/15/2020 11:58  Glucose-Capillary Latest Ref Range: 70 - 99 mg/dL 184 (H) 291 (H) 224 (H) 191 (H) 126 (H) 213 (H)   Diabetes history:  DM2 Outpatient Diabetes medications:  Lantus 65 daily & Novolog 14 units TID with meals per patient Current inpatient diabetes medications: Lantus 65 units QD Novolog 0-15 units TID Novolog 8 units TID with meals  Inpatient Diabetes Program Recommendations:     Novolog 10 units TID with meals if eats at least 50% of meal  Will continue to follow while inpatient.  Thank you, Reche Dixon, RN, BSN Diabetes Coordinator Inpatient Diabetes Program [phone] (team pager from 8a-5p)

## 2020-04-15 NOTE — Progress Notes (Signed)
Pharmacy: Dofetilide (Tikosyn) - Follow Up Assessment and Electrolyte Replacement  Pharmacy consulted to assist in monitoring and replacing electrolytes in this 65 y.o. male admitted on 04/13/2020 undergoing dofetilide initiation. First dofetilide dose: 04/13/20  Labs:    Component Value Date/Time   K 4.1 04/15/2020 0252   K 4.1 03/02/2014 1416   MG 2.5 (H) 04/15/2020 0252   MG 1.2 (L) 08/11/2012 1037     Plan: Potassium: K >/= 4: No additional supplementation needed  Magnesium: Mg > 2: No additional supplementation needed   No potassium or magnesium supplements required.   Thank you for allowing pharmacy to participate in this patient's care   Erin Hearing PharmD., BCPS Clinical Pharmacist 04/15/2020 8:04 AM

## 2020-04-15 NOTE — Progress Notes (Signed)
Electrophysiology Rounding Note  Patient Name: Douglas Hunter Date of Encounter: 04/15/2020  Primary Cardiologist: [name], MD  Electrophysiologist: Vickie Epley, MD    Subjective   Pt converted to sinus rhythm on Tikosyn 500 mcg BID   QTc from EKG last pm shows stable QTc at ~450 or better when corrected for rate and RBBB.  The patient is doing well today.  At this time, the patient denies chest pain, shortness of breath, or any new concerns.  Inpatient Medications    Scheduled Meds: . atorvastatin  80 mg Oral QHS  . benazepril  40 mg Oral Daily  . cholecalciferol  1,000 Units Oral Daily  . dofetilide  500 mcg Oral BID  . ezetimibe  10 mg Oral Daily  . furosemide  20 mg Oral BID  . insulin aspart  0-15 Units Subcutaneous TID WC  . insulin aspart  8 Units Subcutaneous TID WC  . insulin glargine  65 Units Subcutaneous Daily  . methimazole  20 mg Oral BID  . metoprolol tartrate  25 mg Oral BID  . rivaroxaban  20 mg Oral QHS  . sodium chloride flush  3 mL Intravenous Q12H   Continuous Infusions: . sodium chloride     PRN Meds: sodium chloride, acetaminophen, nitroGLYCERIN, sodium chloride flush   Vital Signs    Vitals:   04/14/20 2124 04/14/20 2223 04/15/20 0112 04/15/20 0540  BP: 138/65  119/65 135/67  Pulse: (!) 43 60 (!) 101 100  Resp: 16  15 18   Temp: 97.8 F (36.6 C)  97.8 F (36.6 C) 98.1 F (36.7 C)  TempSrc: Oral  Oral Oral  SpO2: 98%  97% 100%  Weight:    115.6 kg  Height:        Intake/Output Summary (Last 24 hours) at 04/15/2020 0710 Last data filed at 04/14/2020 2000 Gross per 24 hour  Intake 360 ml  Output --  Net 360 ml   Filed Weights   04/13/20 1503 04/14/20 0629 04/15/20 0540  Weight: 117 kg 115.7 kg 115.6 kg    Physical Exam    GEN- The patient is well appearing, alert and oriented x 3 today.   Head- normocephalic, atraumatic Eyes-  Sclera clear, conjunctiva pink Ears- hearing intact Oropharynx- clear Neck-  supple Lungs- Clear to ausculation bilaterally, normal work of breathing Heart- Slow, regular rate and rhythm, no murmurs, rubs or gallops GI- soft, NT, ND, + BS Extremities- no clubbing, cyanosis, or edema Skin- no rash or lesion Psych- euthymic mood, full affect Neuro- strength and sensation are intact  Labs    CBC No results for input(s): WBC, NEUTROABS, HGB, HCT, MCV, PLT in the last 72 hours. Basic Metabolic Panel Recent Labs    04/14/20 0610 04/15/20 0252  NA 137 137  K 4.4 4.1  CL 104 101  CO2 22 25  GLUCOSE 187* 145*  BUN 25* 31*  CREATININE 1.44* 1.55*  CALCIUM 8.6* 8.9  MG 2.0 2.5*    Potassium  Date/Time Value Ref Range Status  04/15/2020 02:52 AM 4.1 3.5 - 5.1 mmol/L Final  03/02/2014 02:16 PM 4.1 3.5 - 5.1 mmol/L Final   Magnesium  Date/Time Value Ref Range Status  04/15/2020 02:52 AM 2.5 (H) 1.7 - 2.4 mg/dL Final    Comment:    Performed at [facility], [place] [address] [place], [place] [address]  08/11/2012 10:37 AM 1.2 (L) mg/dL Final    Comment:    1.8-2.4 THERAPEUTIC RANGE: 4-7 mg/dL  TOXIC: > 10 mg/dL  ----------------------- MAGNESIUM - Slight hemolysis, interpret results with  - caution.     Telemetry    Sinus brady 40-50s, occasionally dipped into 30s overnight (personally reviewed)  Radiology    No results found.   Patient Profile     Douglas Hunter is a 65 y.o. male with a past medical history significant for persistent atrial fibrillation.  They were admitted for tikosyn load.   Assessment & Plan    1. Persistent atrial fibrillation Pt remains in SB/NSR on Tikosyn 500 mcg BID  Continue Xarelto Electrolytes stable. K 4.1, Mg 2.5 CHA2DS2VASC is at least 4  2. Bradycardia HRs mostly 40s, as low as 30s overnight. Will again hold am dose of BB and decrease for evening dose, ultimately, may stop altogether for now in sinus.   Tentatively plan home tomorrow if QTc remains stable. Continue to monitor bradycardia.     For questions or updates, please contact [place] Please consult [email or web address] for contact info under Cardiology/STEMI.  Signed, Shirley Friar, PA-C  04/15/2020, 7:10 AM

## 2020-04-15 NOTE — Progress Notes (Signed)
Morning EKG reviewed  Shows remains in NSR at 42 bpm with stable QTc at ~460 ms when corrected for rate and RBBB  Continue Tikosyn 500 mcg BID.   Tenatively plan for d/c tomorrow if QT remains stable.    Shirley Friar, PA-C  Pager: [phone]  04/15/2020 12:22 PM

## 2020-04-16 DIAGNOSIS — I480 Paroxysmal atrial fibrillation: Secondary | ICD-10-CM | POA: Diagnosis not present

## 2020-04-16 LAB — BASIC METABOLIC PANEL
Anion gap: 11 (ref 5–15)
BUN: 25 mg/dL — ABNORMAL HIGH (ref 8–23)
CO2: 24 mmol/L (ref 22–32)
Calcium: 9 mg/dL (ref 8.9–10.3)
Chloride: 101 mmol/L (ref 98–111)
Creatinine, Ser: 1.36 mg/dL — ABNORMAL HIGH (ref 0.61–1.24)
GFR, Estimated: 58 mL/min — ABNORMAL LOW (ref >60)
Glucose, Bld: 132 mg/dL — ABNORMAL HIGH (ref 70–99)
Potassium: 4.2 mmol/L (ref 3.5–5.1)
Sodium: 136 mmol/L (ref 135–145)

## 2020-04-16 LAB — GLUCOSE, CAPILLARY
Glucose-Capillary: 152 mg/dL — ABNORMAL HIGH (ref 70–99)
Glucose-Capillary: 216 mg/dL — ABNORMAL HIGH (ref 70–99)

## 2020-04-16 LAB — MAGNESIUM: Magnesium: 2.2 mg/dL (ref 1.7–2.4)

## 2020-04-16 MED ORDER — AMLODIPINE BESYLATE 5 MG PO TABS: 5.0000 mg | ORAL_TABLET | Freq: Every day | ORAL | 6 refills | Status: DC

## 2020-04-16 MED ORDER — AMLODIPINE BESYLATE 5 MG PO TABS
5.0000 mg | ORAL_TABLET | Freq: Every day | ORAL | Status: DC
  Administered 2020-04-16: 5 mg via ORAL
  Filled 2020-04-16: qty 1

## 2020-04-16 MED ORDER — METOPROLOL SUCCINATE ER 25 MG PO TB24: 12.5000 mg | ORAL_TABLET | Freq: Every day | ORAL | Status: DC

## 2020-04-16 MED ORDER — DOFETILIDE 500 MCG PO CAPS: 500.0000 ug | ORAL_CAPSULE | Freq: Two times a day (BID) | ORAL | 6 refills | Status: DC

## 2020-04-16 MED ORDER — METOPROLOL SUCCINATE ER 25 MG PO TB24: 12.5000 mg | ORAL_TABLET | Freq: Every day | ORAL | 6 refills | Status: DC

## 2020-04-16 MED FILL — DOFETILIDE 500 MCG CAPS: 500 | 30 days supply | Qty: 60 | Fill #0

## 2020-04-16 MED FILL — METOPROLOL SUCCINATE ER 25: 25 | 30 days supply | Qty: 15 | Fill #0

## 2020-04-16 MED FILL — AMLODIPINE BESYLATE 5 MG TA: 5 | 30 days supply | Qty: 30 | Fill #0

## 2020-04-16 NOTE — Progress Notes (Signed)
1/12 evening EKG reviewed  Shows remains in NSR at 51 bpm with stable QTc at <460 ms when corrected for rate and RBBB  Continue Tikosyn 500 mcg BID.   Electrolytes stable.   Plan for home this afternoon if QTc remains stable.   Shirley Friar, PA-C  Pager: [phone]  04/16/2020 7:04 AM

## 2020-04-16 NOTE — Discharge Summary (Addendum)
   ELECTROPHYSIOLOGY PROCEDURE DISCHARGE SUMMARY    Patient ID: Dezmin P Warda,  MRN: [id number], DOB/AGE: 66/26/1957 66 y.o.  Admit date: 04/13/2020 Discharge date: 04/16/2020  Primary Care Physician: Feldpausch, Dale E, MD  Primary Cardiologist: Timothy Gollan, MD  Electrophysiologist: CAMERON T LAMBERT, MD   Primary Discharge Diagnosis:  1.  Persistent atrial fibrillation status post Tikosyn loading this admission  Secondary Discharge Diagnosis:  2. Sinus bradycardia  No Known Allergies  Procedures This Admission:  1.  Tikosyn loading  Brief HPI: Douglas Hunter is a 65 y.o. male with a past medical history as noted above.  They were referred to EP in the outpatient setting for treatment options of atrial fibrillation.  Risks, benefits, and alternatives to Tikosyn were reviewed with the patient who wished to proceed.    Hospital Course:  The patient was admitted and Tikosyn was initiated.  Renal function and electrolytes were followed during the hospitalization.  Their QTc remained stable.  Pt converted to sinus brady after first dose of tikosyn and did not require DCC. They were monitored until discharge on telemetry which demonstrated Sinus brady/NSR 40-70s. He had occasional PVCs and 1 3 beat run of NSVT. On the day of discharge, they were examined by Dr. Lambert  who considered them stable for discharge to home.  Follow-up has been arranged with the Atrial Fibrillation clinic in approximately 1 week and with Dr. Lambert  in 4 weeks.   Physical Exam: Vitals:   04/15/20 1559 04/15/20 2123 04/16/20 0555 04/16/20 0834  BP: (!) 143/74 134/75 (!) 155/93 (!) 150/86  Pulse: (!) 48 (!) 52 70 83  Resp: 16 17 16 19  Temp: 97.8 F (36.6 C) 97.7 F (36.5 C) 97.6 F (36.4 C) 97.9 F (36.6 C)  TempSrc: Oral Oral Oral   SpO2:  99% 98% 98%  Weight:   111.9 kg   Height:        GEN- The patient is well appearing, alert and oriented x 3 today.   HEENT: normocephalic,  atraumatic; sclera clear, conjunctiva pink; hearing intact; oropharynx clear; neck supple, no JVP Lymph- no cervical lymphadenopathy Lungs- Clear to ausculation bilaterally, normal work of breathing.  No wheezes, rales, rhonchi Heart- Regular rate and rhythm, no murmurs, rubs or gallops, PMI not laterally displaced GI- soft, non-tender, non-distended, bowel sounds present, no hepatosplenomegaly Extremities- no clubbing, cyanosis, or edema; DP/PT/radial pulses 2+ bilaterally MS- no significant deformity or atrophy Skin- warm and dry, no rash or lesion Psych- euthymic mood, full affect Neuro- strength and sensation are intact   Labs:   Lab Results  Component Value Date   WBC 14.6 (H) 03/13/2020   HGB 13.3 03/13/2020   HCT 40.2 03/13/2020   MCV 92.2 03/13/2020   PLT 244 03/13/2020    Recent Labs  Lab 04/16/20 0233  NA 136  K 4.2  CL 101  CO2 24  BUN 25*  CREATININE 1.36*  CALCIUM 9.0  GLUCOSE 132*     Discharge Medications:  Allergies as of 04/16/2020   No Known Allergies     Medication List    STOP taking these medications   diltiazem 180 MG 24 hr capsule Commonly known as: CARDIZEM CD   metoprolol tartrate 100 MG tablet Commonly known as: LOPRESSOR     TAKE these medications   acetaminophen 500 MG tablet Commonly known as: TYLENOL Take 500 mg by mouth every 6 (six) hours as needed for mild pain or moderate pain.   amLODipine 5 MG   tablet Commonly known as: NORVASC Take 1 tablet (5 mg total) by mouth daily. Start taking on: April 17, 2020   atorvastatin 80 MG tablet Commonly known as: LIPITOR Take 80 mg by mouth at bedtime.   benazepril 40 MG tablet Commonly known as: LOTENSIN Take 1 tablet (40 mg total) by mouth daily.   cholecalciferol 25 MCG (1000 UNIT) tablet Commonly known as: VITAMIN D3 Take 1,000 Units by mouth daily.   dofetilide 500 MCG capsule Commonly known as: TIKOSYN Take 1 capsule (500 mcg total) by mouth 2 (two) times daily.    ezetimibe 10 MG tablet Commonly known as: ZETIA TAKE 1 TABLET(10 MG) BY MOUTH DAILY What changed:   how much to take  how to take this  when to take this   FreeStyle Libre 14 Day Sensor Misc SMARTSIG:1 Kit(s) Topical Every 2 Weeks   furosemide 20 MG tablet Commonly known as: LASIX TAKE 1 TABLET(20 MG) BY MOUTH TWICE DAILY AS NEEDED What changed: See the new instructions.   insulin glargine 100 UNIT/ML injection Commonly known as: LANTUS Inject 65 Units into the skin daily.   insulin lispro 100 UNIT/ML KiwkPen Commonly known as: HUMALOG Inject 10-16 Units into the skin 3 (three) times daily with meals. Sliding Scale   INSULIN SYRINGE 1CC/29G 29G X 1/2" 1 ML Misc USE AS DIRECTED   methimazole 10 MG tablet Commonly known as: TAPAZOLE Take 2 tablets (20 mg total) by mouth 2 (two) times daily.   metoprolol succinate 25 MG 24 hr tablet Commonly known as: TOPROL-XL Take 0.5 tablets (12.5 mg total) by mouth at bedtime.   nitroGLYCERIN 0.4 MG SL tablet Commonly known as: NITROSTAT Place 1 tablet (0.4 mg total) under the tongue every 5 (five) minutes as needed for chest pain.   ONE TOUCH ULTRA TEST test strip Generic drug: glucose blood TEST THREE TIMES A DAY AS DIRECTED.   rivaroxaban 20 MG Tabs tablet Commonly known as: Xarelto Take 1 tablet (20 mg total) by mouth daily with supper.       Disposition:    Follow-up Information    [facility] Follow up.   Specialty: Cardiology Why: on 1/20 at 1130 for post hospital tikosyn f/u. Contact information: [address] [id number] [place] [address] [phone]              Duration of Discharge Encounter: Greater than 30 minutes including physician time.  Signed, Michael Andrew Tillery, PA-C  04/16/2020 11:03 AM     

## 2020-04-16 NOTE — Progress Notes (Signed)
Pharmacy: Dofetilide (Tikosyn) - Follow Up Assessment and Electrolyte Replacement  Pharmacy consulted to assist in monitoring and replacing electrolytes in this 65 y.o. male admitted on 04/13/2020 undergoing dofetilide initiation. First dofetilide dose: 04/13/20  Labs:    Component Value Date/Time   K 4.2 04/16/2020 0233   K 4.1 03/02/2014 1416   MG 2.2 04/16/2020 0233   MG 1.2 (L) 08/11/2012 1037     Plan: Potassium: K >/= 4: No additional supplementation needed  Magnesium: Mg > 2: No additional supplementation needed   No potassium or magnesium supplements required.   No cardioversion needed, plan home today if qtc stable.   Thank you for allowing pharmacy to participate in this patient's care   Erin Hearing PharmD., BCPS Clinical Pharmacist 04/16/2020 7:55 AM

## 2020-04-17 ENCOUNTER — Other Ambulatory Visit: Payer: Self-pay | Admitting: Family Medicine

## 2020-04-17 DIAGNOSIS — N5089 Other specified disorders of the male genital organs: Secondary | ICD-10-CM

## 2020-04-22 ENCOUNTER — Ambulatory Visit
Admission: RE | Admit: 2020-04-22 | Discharge: 2020-04-22 | Disposition: A | Discharge status: Home / Self Care | Payer: Medicare Other | Source: Ambulatory Visit | Attending: Family Medicine | Admitting: Family Medicine

## 2020-04-22 ENCOUNTER — Other Ambulatory Visit: Payer: Self-pay

## 2020-04-22 DIAGNOSIS — N5089 Other specified disorders of the male genital organs: Secondary | ICD-10-CM | POA: Diagnosis present

## 2020-04-23 ENCOUNTER — Ambulatory Visit (HOSPITAL_COMMUNITY)
Admit: 2020-04-23 | Discharge: 2020-04-23 | Disposition: A | Discharge status: Home / Self Care | Payer: Medicare Other | Source: Ambulatory Visit | Attending: Physician Assistant | Admitting: Physician Assistant

## 2020-04-23 ENCOUNTER — Encounter (HOSPITAL_COMMUNITY): Payer: Self-pay | Admitting: Physician Assistant

## 2020-04-23 VITALS — BP 142/60 | HR 55 | Ht 71.0 in | Wt 262.2 lb

## 2020-04-23 DIAGNOSIS — I480 Paroxysmal atrial fibrillation: Secondary | ICD-10-CM | POA: Diagnosis present

## 2020-04-23 DIAGNOSIS — Z6836 Body mass index (BMI) 36.0-36.9, adult: Secondary | ICD-10-CM | POA: Diagnosis not present

## 2020-04-23 DIAGNOSIS — E669 Obesity, unspecified: Secondary | ICD-10-CM | POA: Diagnosis not present

## 2020-04-23 DIAGNOSIS — Z794 Long term (current) use of insulin: Secondary | ICD-10-CM | POA: Diagnosis not present

## 2020-04-23 DIAGNOSIS — Z87891 Personal history of nicotine dependence: Secondary | ICD-10-CM | POA: Insufficient documentation

## 2020-04-23 DIAGNOSIS — I2510 Atherosclerotic heart disease of native coronary artery without angina pectoris: Secondary | ICD-10-CM | POA: Insufficient documentation

## 2020-04-23 DIAGNOSIS — Z8249 Family history of ischemic heart disease and other diseases of the circulatory system: Secondary | ICD-10-CM | POA: Diagnosis not present

## 2020-04-23 DIAGNOSIS — Z79899 Other long term (current) drug therapy: Secondary | ICD-10-CM | POA: Diagnosis not present

## 2020-04-23 DIAGNOSIS — G4733 Obstructive sleep apnea (adult) (pediatric): Secondary | ICD-10-CM | POA: Diagnosis not present

## 2020-04-23 DIAGNOSIS — E785 Hyperlipidemia, unspecified: Secondary | ICD-10-CM | POA: Diagnosis not present

## 2020-04-23 DIAGNOSIS — I4892 Unspecified atrial flutter: Secondary | ICD-10-CM | POA: Diagnosis not present

## 2020-04-23 DIAGNOSIS — I4510 Unspecified right bundle-branch block: Secondary | ICD-10-CM | POA: Insufficient documentation

## 2020-04-23 DIAGNOSIS — E118 Type 2 diabetes mellitus with unspecified complications: Secondary | ICD-10-CM | POA: Insufficient documentation

## 2020-04-23 DIAGNOSIS — Z7901 Long term (current) use of anticoagulants: Secondary | ICD-10-CM | POA: Insufficient documentation

## 2020-04-23 DIAGNOSIS — I10 Essential (primary) hypertension: Secondary | ICD-10-CM | POA: Diagnosis not present

## 2020-04-23 DIAGNOSIS — D6869 Other thrombophilia: Secondary | ICD-10-CM | POA: Insufficient documentation

## 2020-04-23 LAB — BASIC METABOLIC PANEL
Anion gap: 10 (ref 5–15)
BUN: 23 mg/dL (ref 8–23)
CO2: 23 mmol/L (ref 22–32)
Calcium: 8.6 mg/dL — ABNORMAL LOW (ref 8.9–10.3)
Chloride: 101 mmol/L (ref 98–111)
Creatinine, Ser: 1.62 mg/dL — ABNORMAL HIGH (ref 0.61–1.24)
GFR, Estimated: 47 mL/min — ABNORMAL LOW (ref >60)
Glucose, Bld: 221 mg/dL — ABNORMAL HIGH (ref 70–99)
Potassium: 4.8 mmol/L (ref 3.5–5.1)
Sodium: 134 mmol/L — ABNORMAL LOW (ref 135–145)

## 2020-04-23 LAB — MAGNESIUM: Magnesium: 2.1 mg/dL (ref 1.7–2.4)

## 2020-04-23 NOTE — Progress Notes (Signed)
Primary Care Physician: Sofie Hartigan, MD Primary Cardiologist: Dr Rockey Situ Primary Electrophysiologist: Dr Quentin Ore Referring Physician: Dr Melchor Amour is a 65 y.o. male with a history of CAD s/p non-STEMI 08/2011, atrial fibrillation, atrial flutter, hypertension, hyperlipidemia, DM2, OSA (diagnosed 2018), obesity, RBBB, GERD, who presents for follow up in the [facility].  Patient is on Xarelto for a CHADS2VASC score of 4. He is s/p afib and flutter ablation in 2014 with Dr Rayann Heman. He has had recurrent symptoms of afib and has been to the ED several times in the last couple months. Seen by Dr Quentin Ore who recommended dofetilide admission.   On follow up today, patient is s/p dofetilide loading 1/10-1/13/22. Patient converted to SR with medication and did not require DCCV. He reports that he feels well with no further heart racing or palpitations. He is tolerating the medication without difficulty. He denies any bleeding issues on anticoagulation.   Today, he denies symptoms of palpitaitons, chest pain, orthopnea, PND, lower extremity edema, dizziness, presyncope, syncope, snoring, daytime somnolence, bleeding, or neurologic sequela. The patient is tolerating medications without difficulties and is otherwise without complaint today.    Atrial Fibrillation Risk Factors:  he does have symptoms or diagnosis of sleep apnea. he is compliant with CPAP therapy. he does not have a history of rheumatic fever.  he has a BMI of Body mass index is 36.57 kg/m.Marland Kitchen Filed Weights   04/23/20 1116  Weight: 118.9 kg    Family History  Problem Relation Age of Onset  . Heart attack Maternal Uncle   . Heart attack Father 24  . Cancer Father        throat  . Stroke Mother   . Heart disease Mother        pacemaker  . Diabetes Son   . Heart disease Paternal Grandfather        MI     Atrial Fibrillation Management history:  Previous antiarrhythmic  drugs: amiodarone, dofetilide Previous cardioversions: none Previous ablations: 01/2013 fib and flutter CHADS2VASC score: 4 Anticoagulation history: warfarin, Xarelto   Past Medical History:  Diagnosis Date  . AKI (acute kidney injury) ([place]) 03/13/2020  . Atrial flutter ([place])   . Coronary artery disease 08/2011   a. NSTEMI 08/2011, LHC w/ severe disease of LCx with good collaterals, lesion was high-risk for intervention due to a steep angulation of the LCx coming from the left main coronary artery as well as heavy calcifications, discused at cath conference w/ consensus advising med Rx; b. 03/2020 Cath: LM nl, LAD 61m LCX 100p w/ R->L and L->L collats. RCA dominant, nl. EF 55%. PA 33/6/19, PCWP 8.  .Marland KitchenGERD (gastroesophageal reflux disease)   . History of echocardiogram    a. 01/2020 Echo: EF 60-65%, no rwma. Sev elev PASP. Mod dil LA.  .Marland KitchenHyperlipidemia   . Hypertension   . Myocardial infarction ([place]   . Paroxysmal atrial fibrillation ([place]    a. s/p ablation 01/2013; b CHADS2VASc => 3 (HTN, DM, vascular disease); c. on Coumadin; d. 12/2019 Am[place] d/c'd 2/2 hyperthyroidism.  .Marland KitchenPoorly controlled diabetes mellitus ([facility]   . RBBB   . Sleep apnea    Past Surgical History:  Procedure Laterality Date  . ABLATION  01/29/2013   PVI and CTI by Dr ARayann Hemanfor atrial flutter and paroxysmal atrial fibrillation  . ANTERIOR CERVICAL DECOMP/DISCECTOMY FUSION N/A 04/18/2018   Procedure: ANTERIOR CERVICAL DECOMPRESSION/DISCECTOMY FUSION 2 LEVELS C5-7;  Surgeon: YMeade Maw  MD;  Location: [facility] ORS;  Service: Neurosurgery;  Laterality: N/A;  . ATRIAL FIBRILLATION ABLATION N/A 01/29/2013   Procedure: ATRIAL FIBRILLATION ABLATION;  Surgeon: Coralyn Mark, MD;  Location: [facility] CATH LAB;  Service: Cardiovascular;  Laterality: N/A;  . CARDIAC CATHETERIZATION  08/03/2011  . COLONOSCOPY WITH PROPOFOL N/A 02/02/2016   Procedure: COLONOSCOPY WITH PROPOFOL;  Surgeon: Lucilla Lame, MD;  Location: [facility] ENDOSCOPY;   Service: Endoscopy;  Laterality: N/A;  . LUMBAR LAMINECTOMY/DECOMPRESSION MICRODISCECTOMY N/A 06/07/2017   Procedure: LUMBAR LAMINECTOMY/DECOMPRESSION MICRODISCECTOMY 2 LEVELS-L3-4,L4-5;  Surgeon: Meade Maw, MD;  Location: [facility] ORS;  Service: Neurosurgery;  Laterality: N/A;  . RIGHT/LEFT HEART CATH AND CORONARY ANGIOGRAPHY N/A 03/06/2020   Procedure: RIGHT/LEFT HEART CATH AND CORONARY ANGIOGRAPHY;  Surgeon: Minna Merritts, MD;  Location: [facility] CV LAB;  Service: Cardiovascular;  Laterality: N/A;  . TEE WITHOUT CARDIOVERSION N/A 01/28/2013   Procedure: TRANSESOPHAGEAL ECHOCARDIOGRAM (TEE);  Surgeon: Lelon Perla, MD;  Location: [facility] ENDOSCOPY;  Service: Cardiovascular;  Laterality: N/A;    Current Outpatient Medications  Medication Sig Dispense Refill  . acetaminophen (TYLENOL) 500 MG tablet Take 500 mg by mouth every 6 (six) hours as needed for mild pain or moderate pain.     Marland Kitchen amLODipine (NORVASC) 5 MG tablet Take 1 tablet (5 mg total) by mouth daily. 30 tablet 6  . atorvastatin (LIPITOR) 80 MG tablet Take 80 mg by mouth at bedtime.    . benazepril (LOTENSIN) 40 MG tablet Take 1 tablet (40 mg total) by mouth daily. 30 tablet 3  . cholecalciferol (VITAMIN D3) 25 MCG (1000 UNIT) tablet Take 1,000 Units by mouth daily.    . Continuous Blood Gluc Sensor (FREESTYLE LIBRE 14 DAY SENSOR) MISC SMARTSIG:1 Kit(s) Topical Every 2 Weeks    . dofetilide (TIKOSYN) 500 MCG capsule Take 1 capsule (500 mcg total) by mouth 2 (two) times daily. 60 capsule 6  . ezetimibe (ZETIA) 10 MG tablet TAKE 1 TABLET(10 MG) BY MOUTH DAILY 90 tablet 3  . furosemide (LASIX) 20 MG tablet TAKE 1 TABLET(20 MG) BY MOUTH TWICE DAILY AS NEEDED 60 tablet 1  . insulin glargine (LANTUS) 100 UNIT/ML injection Inject 65 Units into the skin daily.    . insulin lispro (HUMALOG) 100 UNIT/ML KiwkPen Inject 10-16 Units into the skin 3 (three) times daily with meals. Sliding Scale    . INSULIN SYRINGE 1CC/29G 29G X 1/2" 1 ML  MISC USE AS DIRECTED 100 each PRN  . methimazole (TAPAZOLE) 10 MG tablet Take 2 tablets (20 mg total) by mouth 2 (two) times daily. 120 tablet 1  . metoprolol succinate (TOPROL-XL) 25 MG 24 hr tablet Take 0.5 tablets (12.5 mg total) by mouth at bedtime. 15 tablet 6  . nitroGLYCERIN (NITROSTAT) 0.4 MG SL tablet Place 1 tablet (0.4 mg total) under the tongue every 5 (five) minutes as needed for chest pain. 25 tablet 3  . ONE TOUCH ULTRA TEST test strip TEST THREE TIMES A DAY AS DIRECTED. 100 each 12  . rivaroxaban (XARELTO) 20 MG TABS tablet Take 1 tablet (20 mg total) by mouth daily with supper. 28 tablet 0   No current facility-administered medications for this encounter.   Facility-Administered Medications Ordered in Other Encounters  Medication Dose Route Frequency Provider Last Rate Last Admin  . sodium chloride flush (NS) 0.9 % injection 3 mL  3 mL Intravenous Q12H Visser, Jacquelyn D, PA-C        No Known Allergies  Social History   Socioeconomic History  .  Marital status: Married    Spouse name: Not on file  . Number of children: Not on file  . Years of education: Not on file  . Highest education level: Not on file  Occupational History  . Not on file  Tobacco Use  . Smoking status: Former Smoker    Packs/day: 1.50    Years: 35.00    Pack years: 52.50    Types: Cigarettes    Quit date: 01/16/2008    Years since quitting: 12.2  . Smokeless tobacco: Never Used  Vaping Use  . Vaping Use: Never used  Substance and Sexual Activity  . Alcohol use: Not Currently    Comment: occasional  . Drug use: No  . Sexual activity: Yes  Other Topics Concern  . 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  Physical Exam: Vitals:   04/23/20 1116  BP: (!) 142/60  Pulse: (!) 55  Weight: 118.9 kg  Height: '5\' 11"'  (1.803 m)    GEN- The patient is well appearing obese male, alert and oriented x 3 today.   HEENT-head normocephalic, atraumatic, sclera clear, conjunctiva pink, hearing intact, trachea midline. Lungs- Clear to ausculation bilaterally, normal work of breathing Heart- Regular rate and rhythm, no murmurs, rubs or gallops  GI- soft, NT, ND, + BS Extremities- no clubbing, cyanosis, or edema MS- no significant deformity or atrophy Skin- no rash or lesion Psych- euthymic mood, full affect Neuro- strength and sensation are intact   Wt Readings from Last 3 Encounters:  04/23/20 118.9 kg  04/16/20 111.9 kg  04/13/20 117.8 kg    EKG today demonstrates  SB, RBBB Vent. rate 55 BPM PR interval 158 ms QRS duration 140 ms QT/QTc 474/453 ms  Echo 10/13/2 demonstrated  1. Left ventricular ejection fraction, by estimation, is 60 to 65%. The  left ventricle has normal function. The left ventricle has no regional  wall motion abnormalities. Left ventricular diastolic parameters were  normal.  2. Right ventricular systolic function is normal. The right ventricular  size is mildly enlarged. There is severely elevated pulmonary artery  systolic pressure.  3. Left atrial size was moderately dilated.  4. The mitral valve is normal in structure. No evidence of mitral valve  regurgitation. No evidence of mitral stenosis.  5. The aortic valve is grossly normal. Aortic valve regurgitation is not  visualized. No aortic stenosis is present.  6. The inferior vena cava is normal in size with greater than 50%  respiratory variability, suggesting right atrial pressure of 3 mmHg.   Epic records are reviewed at  length today  CHA2DS2-VASc Score = 4  The patient's score is based upon: CHF History: No HTN History: Yes Diabetes History: Yes Stroke History: No Vascular Disease History: Yes Age Score: 1 Gender Score: 0      ASSESSMENT AND PLAN: 1. Paroxysmal Atrial Fibrillation/atrial flutter The patient's CHA2DS2-VASc score is 4, indicating a 4.8% annual risk of stroke.   S/p dofetilide loading 1/10-1/13/22 Patient  appears to be maintaining SR.  Continue dofetilide 500 mcg BID. QT stable. Continue Xarelto 20 mg daily Check bmet/mag today..  2. Secondary Hypercoagulable State (ICD10:  D68.69) The patient is at significant risk for stroke/thromboembolism based upon his CHA2DS2-VASc Score of 4.  Continue Rivaroxaban (Xarelto).   3. Obesity Body mass index is 36.57 kg/m. Lifestyle modification was discussed and encouraged including regular physical activity and weight reduction.*  4. Obstructive sleep apnea Encouraged compliance with CPAP therapy.  5. CAD Stable, chronic angina.   6. HTN Stable, no changes today.    Follow up with Dr Quentin Ore and Dr Rockey Situ as scheduled.    [facility] [address] [place], [place] [address] [phone] 04/23/2020 11:36 AM

## 2020-05-04 ENCOUNTER — Telehealth: Payer: Self-pay | Admitting: Cardiovascular Disease

## 2020-05-04 NOTE — Telephone Encounter (Signed)
Spoke to pt and informed him that he will need to call his primary care office for further management and refills for this medication. Pt verbalized understanding and stated he will call them tomorrow.

## 2020-05-04 NOTE — Telephone Encounter (Signed)
*  STAT* If patient is at the pharmacy, call can be transferred to refill team.   1. Which medications need to be refilled? (please list name of each medication and dose if known) methimazole  2. Which pharmacy/location (including street and city if local pharmacy) is medication to be sent to? [facility] in [place]  3. Do they need a 30 day or 90 day supply? [place]

## 2020-05-04 NOTE — Telephone Encounter (Signed)
Though we first prescribed it, patient needs to follow up with endocrinologist for refills and further management.

## 2020-05-04 NOTE — Telephone Encounter (Signed)
Non-cardiac med. This med is for Thyroid. Refill or send to PCP?

## 2020-05-07 ENCOUNTER — Other Ambulatory Visit: Payer: Self-pay | Admitting: Cardiovascular Disease

## 2020-05-14 ENCOUNTER — Other Ambulatory Visit: Payer: Self-pay | Admitting: Obstetrics and Gynecology

## 2020-05-29 ENCOUNTER — Ambulatory Visit: Payer: Medicare Other | Admitting: Urology

## 2020-05-29 ENCOUNTER — Encounter: Payer: Self-pay | Admitting: Urology

## 2020-05-29 ENCOUNTER — Other Ambulatory Visit: Payer: Self-pay

## 2020-05-29 VITALS — BP 147/83 | HR 77 | Ht 71.0 in | Wt 263.0 lb

## 2020-05-29 DIAGNOSIS — N433 Hydrocele, unspecified: Secondary | ICD-10-CM | POA: Diagnosis not present

## 2020-05-29 DIAGNOSIS — N5089 Other specified disorders of the male genital organs: Secondary | ICD-10-CM

## 2020-05-29 LAB — MICROSCOPIC EXAMINATION
Bacteria, UA: NONE SEEN
Epithelial Cells (non renal): NONE SEEN /hpf (ref 0–10)
WBC, UA: NONE SEEN /hpf (ref 0–5)

## 2020-05-29 LAB — URINALYSIS, COMPLETE
Bilirubin, UA: NEGATIVE
Glucose, UA: NEGATIVE
Ketones, UA: NEGATIVE
Leukocytes,UA: NEGATIVE
Nitrite, UA: NEGATIVE
Protein,UA: NEGATIVE
RBC, UA: NEGATIVE
Specific Gravity, UA: 1.015 (ref 1.005–1.030)
Urobilinogen, Ur: 0.2 mg/dL (ref 0.2–1.0)
pH, UA: 5.5 (ref 5.0–7.5)

## 2020-05-29 NOTE — Progress Notes (Signed)
05/29/2020 8:47 AM   Douglas Hunter 1955/11/13 [id number]  Referring provider: Sofie Hartigan, MD [address] [place],  [place] [id number]  Chief Complaint  Patient presents with  . New Patient (Initial Visit)    Swelling of right testicle    HPI: Douglas Hunter is a 65 y.o. male referred for evaluation of right scrotal swelling   Noted enlargement of the right hemiscrotum ~ 2 months ago  No pain or discomfort  No bothersome LUTS  Does note increased size during the day and decreased size after awakening in the morning  Scrotal ultrasound performed 04/22/2020 showed a 6 cm multiloculated fluid collection and normal-appearing testes   PMH: Past Medical History:  Diagnosis Date  . AKI (acute kidney injury) ([place]) 03/13/2020  . Atrial flutter ([place])   . Coronary artery disease 08/2011   a. NSTEMI 08/2011, LHC w/ severe disease of LCx with good collaterals, lesion was high-risk for intervention due to a steep angulation of the LCx coming from the left main coronary artery as well as heavy calcifications, discused at cath conference w/ consensus advising med Rx; b. 03/2020 Cath: LM nl, LAD 30m LCX 100p w/ R->L and L->L collats. RCA dominant, nl. EF 55%. PA 33/6/19, PCWP 8.  .Marland KitchenGERD (gastroesophageal reflux disease)   . History of echocardiogram    a. 01/2020 Echo: EF 60-65%, no rwma. Sev elev PASP. Mod dil LA.  .Marland KitchenHyperlipidemia   . Hypertension   . Myocardial infarction ([place]   . Paroxysmal atrial fibrillation ([place]    a. s/p ablation 01/2013; b CHADS2VASc => 3 (HTN, DM, vascular disease); c. on Coumadin; d. 12/2019 Amio d/c'd 2/2 hyperthyroidism.  .Marland KitchenPoorly controlled diabetes mellitus ([place]   . RBBB   . Sleep apnea     Surgical History: Past Surgical History:  Procedure Laterality Date  . ABLATION  01/29/2013   PVI and CTI by Dr ARayann Hemanfor atrial flutter and paroxysmal atrial fibrillation  . ANTERIOR CERVICAL DECOMP/DISCECTOMY FUSION N/A 04/18/2018   Procedure:  ANTERIOR CERVICAL DECOMPRESSION/DISCECTOMY FUSION 2 LEVELS C5-7;  Surgeon: YMeade Maw MD;  Location: [facility];  Service: Neurosurgery;  Laterality: N/A;  . ATRIAL FIBRILLATION ABLATION N/A 01/29/2013   Procedure: ATRIAL FIBRILLATION ABLATION;  Surgeon: JCoralyn Mark MD;  [facility];  Service: Cardiovascular;  Laterality: N/A;  . CARDIAC CATHETERIZATION  08/03/2011  . COLONOSCOPY WITH PROPOFOL N/A 02/02/2016   Procedure: COLONOSCOPY WITH PROPOFOL;  Surgeon: DLucilla Lame MD;  Location: [facility];  Service: Endoscopy;  Laterality: N/A;  . LUMBAR LAMINECTOMY/DECOMPRESSION MICRODISCECTOMY N/A 06/07/2017   Procedure: LUMBAR LAMINECTOMY/DECOMPRESSION MICRODISCECTOMY 2 LEVELS-L3-4,L4-5;  Surgeon: YMeade Maw MD;  Location: [facility];  Service: Neurosurgery;  Laterality: N/A;  . RIGHT/LEFT HEART CATH AND CORONARY ANGIOGRAPHY N/A 03/06/2020   Procedure: RIGHT/LEFT HEART CATH AND CORONARY ANGIOGRAPHY;  Surgeon: GMinna Merritts MD;  [facility];  Service: Cardiovascular;  Laterality: N/A;  . TEE WITHOUT CARDIOVERSION N/A 01/28/2013   Procedure: TRANSESOPHAGEAL ECHOCARDIOGRAM (TEE);  Surgeon: BLelon Perla MD;  [facility];  Service: Cardiovascular;  Laterality: N/A;    Home Medications:  Allergies as of 05/29/2020   No Known Allergies     Medication List       Accurate as of May 29, 2020  8:47 AM. If you have any questions, ask your nurse or doctor.        acetaminophen 500 MG tablet Commonly known as: TYLENOL Take 500 mg by mouth every 6 (six)  hours as needed for mild pain or moderate pain.   amLODipine 5 MG tablet Commonly known as: NORVASC Take 1 tablet (5 mg total) by mouth daily.   atorvastatin 80 MG tablet Commonly known as: LIPITOR Take 80 mg by mouth at bedtime.   benazepril 40 MG tablet Commonly known as: LOTENSIN Take 1 tablet (40 mg total) by mouth daily.   cholecalciferol 25 MCG (1000 UNIT)  tablet Commonly known as: VITAMIN D3 Take 1,000 Units by mouth daily.   dofetilide 500 MCG capsule Commonly known as: TIKOSYN Take 1 capsule (500 mcg total) by mouth 2 (two) times daily.   ezetimibe 10 MG tablet Commonly known as: ZETIA TAKE 1 TABLET(10 MG) BY MOUTH DAILY   FreeStyle Libre 14 Day Sensor Misc SMARTSIG:1 Kit(s) Topical Every 2 Weeks   furosemide 20 MG tablet Commonly known as: LASIX TAKE 1 TABLET(20 MG) BY MOUTH TWICE DAILY AS NEEDED   insulin glargine 100 UNIT/ML injection Commonly known as: LANTUS Inject 65 Units into the skin daily.   insulin lispro 100 UNIT/ML KiwkPen Commonly known as: HUMALOG Inject 10-16 Units into the skin 3 (three) times daily with meals. Sliding Scale   INSULIN SYRINGE 1CC/29G 29G X 1/2" 1 ML Misc USE AS DIRECTED   methimazole 10 MG tablet Commonly known as: TAPAZOLE Take 2 tablets (20 mg total) by mouth 2 (two) times daily.   metoprolol succinate 25 MG 24 hr tablet Commonly known as: TOPROL-XL Take 0.5 tablets (12.5 mg total) by mouth at bedtime.   nitroGLYCERIN 0.4 MG SL tablet Commonly known as: NITROSTAT Place 1 tablet (0.4 mg total) under the tongue every 5 (five) minutes as needed for chest pain.   ONE TOUCH ULTRA TEST test strip Generic drug: glucose blood TEST THREE TIMES A DAY AS DIRECTED.   rivaroxaban 20 MG Tabs tablet Commonly known as: Xarelto Take 1 tablet (20 mg total) by mouth daily with supper.       Allergies: No Known Allergies  Family History: Family History  Problem Relation Age of Onset  . Heart attack Maternal Uncle   . Heart attack Father 11  . Cancer Father        throat  . Stroke Mother   . Heart disease Mother        pacemaker  . Diabetes Son   . Heart disease Paternal Grandfather        MI    Social History:  reports that he quit smoking about 12 years ago. His smoking use included cigarettes. He has a 52.50 pack-year smoking history. He has never used smokeless tobacco. He  reports previous alcohol use. He reports that he does not use drugs.   Physical Exam: BP (!) 147/83   Pulse 77   Ht _0  (1.803 m)   Wt 263 lb (119.3 kg)   BMI 36.68 kg/m   Constitutional:  Alert and oriented, No acute distress. HEENT:  AT, moist mucus membranes.  Trachea midline, no masses. Cardiovascular: No clubbing, cyanosis, or edema. Respiratory: Normal respiratory effort, no increased work of breathing. GI: Abdomen is soft, nontender, nondistended, no abdominal masses GU: Phallus without lesions, moderate right hemiscrotal swelling; right testis not palpable.  No evidence of hernia.  Left testis palpably normal Skin: No rashes, bruises or suspicious lesions. Neurologic: Grossly intact, no focal deficits, moving all 4 extremities. Psychiatric: Normal mood and affect.  Pertinent imaging: Images were personally reviewed and interpreted.   Assessment & Plan:    1.  Left hydrocele  Exam is more consistent with a hydrocele however based on scrotal ultrasound this could represent a large spermatocele  Asymptomatic  Options were discussed observation, aspiration and surgical excision  The high recurrence rate of aspiration was discussed  Since he does note some day-to-day size variation we discussed the possibility of a noncommunicating hydrocele and if he elected hydrocelectomy would recommend a pelvic CT to assess for any communication   Abbie Sons, MD  [facility] [address] [place], [place] [address] [phone]

## 2020-05-31 ENCOUNTER — Encounter: Payer: Self-pay | Admitting: Urology

## 2020-06-03 ENCOUNTER — Other Ambulatory Visit: Payer: Self-pay

## 2020-06-03 ENCOUNTER — Encounter: Payer: Self-pay | Admitting: Cardiology

## 2020-06-03 ENCOUNTER — Ambulatory Visit: Payer: Medicare Other | Admitting: Cardiology

## 2020-06-03 VITALS — BP 128/64 | HR 50 | Ht 71.0 in | Wt 265.0 lb

## 2020-06-03 DIAGNOSIS — Z79899 Other long term (current) drug therapy: Secondary | ICD-10-CM

## 2020-06-03 DIAGNOSIS — I483 Typical atrial flutter: Secondary | ICD-10-CM

## 2020-06-03 DIAGNOSIS — I480 Paroxysmal atrial fibrillation: Secondary | ICD-10-CM | POA: Diagnosis not present

## 2020-06-03 NOTE — Patient Instructions (Addendum)
Medication Instructions:  Your physician recommends that you continue on your current medications as directed. Please refer to the Current Medication list given to you today. *If you need a refill on your cardiac medications before your next appointment, please call your pharmacy*  Lab Work: You will get lab work today:  BMP and mag  If you have labs (blood work) drawn today and your tests are completely normal, you will receive your results only by: Marland Kitchen MyChart Message (if you have MyChart) OR . A paper copy in the mail If you have any lab test that is abnormal or we need to change your treatment, we will call you to review the results.  Testing/Procedures: None ordered.  Follow-Up: At [facility], you and your health needs are our priority.  As part of our continuing mission to provide you with exceptional heart care, we have created designated Provider Care Teams.  These Care Teams include your primary Cardiologist (physician) and Advanced Practice Providers (APPs -  Physician Assistants and Nurse Practitioners) who all work together to provide you with the care you need, when you need it.  Your next appointment:   Your physician wants you to follow-up in: 3 months with Dr. Quentin Ore.

## 2020-06-03 NOTE — Progress Notes (Signed)
Electrophysiology Office Follow up Visit Note:    Date:  06/03/2020   ID:  Douglas Hunter, DOB 1955/09/28, MRN [id number]  PCP:  Sofie Hartigan, MD  [facility] Cardiologist:  [name], MD  [facility] Electrophysiologist:  Vickie Epley, MD    Interval History:    Douglas Hunter is a 65 y.o. male who presents for a follow up visit.  He was successfully loaded with dofetilide on April 13, 2020.  He is maintained on 500 mcg twice daily.  He takes Xarelto for stroke prophylaxis.  He has had no trouble with the medication and is maintaining normal rhythm.    Past Medical History:  Diagnosis Date  . AKI (acute kidney injury) ([place]) 03/13/2020  . Atrial flutter ([place])   . Coronary artery disease 08/2011   a. NSTEMI 08/2011, LHC w/ severe disease of LCx with good collaterals, lesion was high-risk for intervention due to a steep angulation of the LCx coming from the left main coronary artery as well as heavy calcifications, discused at cath conference w/ consensus advising med Rx; b. 03/2020 Cath: LM nl, LAD 31m LCX 100p w/ R->L and L->L collats. RCA dominant, nl. EF 55%. PA 33/6/19, PCWP 8.  .Marland KitchenGERD (gastroesophageal reflux disease)   . History of echocardiogram    a. 01/2020 Echo: EF 60-65%, no rwma. Sev elev PASP. Mod dil LA.  .Marland KitchenHyperlipidemia   . Hypertension   . Myocardial infarction ([place]   . Paroxysmal atrial fibrillation ([place]    a. s/p ablation 01/2013; b CHADS2VASc => 3 (HTN, DM, vascular disease); c. on Coumadin; d. 12/2019 Amio d/c'd 2/2 hyperthyroidism.  .Marland KitchenPoorly controlled diabetes mellitus ([place]   . RBBB   . Sleep apnea     Past Surgical History:  Procedure Laterality Date  . ABLATION  01/29/2013   PVI and CTI by Dr ARayann Hemanfor atrial flutter and paroxysmal atrial fibrillation  . ANTERIOR CERVICAL DECOMP/DISCECTOMY FUSION N/A 04/18/2018   Procedure: ANTERIOR CERVICAL DECOMPRESSION/DISCECTOMY FUSION 2 LEVELS C5-7;  Surgeon: YMeade Maw MD;   Location: [facility];  Service: Neurosurgery;  Laterality: N/A;  . ATRIAL FIBRILLATION ABLATION N/A 01/29/2013   Procedure: ATRIAL FIBRILLATION ABLATION;  Surgeon: JCoralyn Mark MD;  [facility];  Service: Cardiovascular;  Laterality: N/A;  . CARDIAC CATHETERIZATION  08/03/2011  . COLONOSCOPY WITH PROPOFOL N/A 02/02/2016   Procedure: COLONOSCOPY WITH PROPOFOL;  Surgeon: DLucilla Lame MD;  Location: [facility];  Service: Endoscopy;  Laterality: N/A;  . LUMBAR LAMINECTOMY/DECOMPRESSION MICRODISCECTOMY N/A 06/07/2017   Procedure: LUMBAR LAMINECTOMY/DECOMPRESSION MICRODISCECTOMY 2 LEVELS-L3-4,L4-5;  Surgeon: YMeade Maw MD;  Location: [facility];  Service: Neurosurgery;  Laterality: N/A;  . RIGHT/LEFT HEART CATH AND CORONARY ANGIOGRAPHY N/A 03/06/2020   Procedure: RIGHT/LEFT HEART CATH AND CORONARY ANGIOGRAPHY;  Surgeon: GMinna Merritts MD;  [facility];  Service: Cardiovascular;  Laterality: N/A;  . TEE WITHOUT CARDIOVERSION N/A 01/28/2013   Procedure: TRANSESOPHAGEAL ECHOCARDIOGRAM (TEE);  Surgeon: BLelon Perla MD;  [facility];  Service: Cardiovascular;  Laterality: N/A;    Current Medications: Current Meds  Medication Sig  . acetaminophen (TYLENOL) 500 MG tablet Take 500 mg by mouth every 6 (six) hours as needed for mild pain or moderate pain.   .Marland KitchenamLODipine (NORVASC) 5 MG tablet Take 1 tablet (5 mg total) by mouth daily.  .Marland Kitchenatorvastatin (LIPITOR) 80 MG tablet Take 80 mg by mouth at bedtime.  . benazepril (LOTENSIN) 40 MG tablet Take 1 tablet (40  mg total) by mouth daily.  . cholecalciferol (VITAMIN D3) 25 MCG (1000 UNIT) tablet Take 1,000 Units by mouth daily.  . Continuous Blood Gluc Sensor (FREESTYLE LIBRE 14 DAY SENSOR) MISC SMARTSIG:1 Kit(s) Topical Every 2 Weeks  . dofetilide (TIKOSYN) 500 MCG capsule Take 1 capsule (500 mcg total) by mouth 2 (two) times daily.  Marland Kitchen ezetimibe (ZETIA) 10 MG tablet TAKE 1 TABLET(10 MG) BY MOUTH DAILY  .  furosemide (LASIX) 20 MG tablet TAKE 1 TABLET(20 MG) BY MOUTH TWICE DAILY AS NEEDED  . insulin glargine (LANTUS) 100 UNIT/ML injection Inject 65 Units into the skin daily.  . insulin lispro (HUMALOG) 100 UNIT/ML KiwkPen Inject 10-16 Units into the skin 3 (three) times daily with meals. Sliding Scale  . INSULIN SYRINGE 1CC/29G 29G X 1/2" 1 ML MISC USE AS DIRECTED  . methimazole (TAPAZOLE) 10 MG tablet Take 2 tablets (20 mg total) by mouth 2 (two) times daily.  . metoprolol succinate (TOPROL-XL) 25 MG 24 hr tablet Take 0.5 tablets (12.5 mg total) by mouth at bedtime.  . nitroGLYCERIN (NITROSTAT) 0.4 MG SL tablet Place 1 tablet (0.4 mg total) under the tongue every 5 (five) minutes as needed for chest pain.  . ONE TOUCH ULTRA TEST test strip TEST THREE TIMES A DAY AS DIRECTED.  . rivaroxaban (XARELTO) 20 MG TABS tablet Take 1 tablet (20 mg total) by mouth daily with supper.     Allergies:   Patient has no known allergies.   Social History   Socioeconomic History  . Marital status: Married    Spouse name: Not on file  . Number of children: Not on file  . Years of education: Not on file  . Highest education level: Not on file  Occupational History  . Not on file  Tobacco Use  . Smoking status: Former Smoker    Packs/day: 1.50    Years: 35.00    Pack years: 52.50    Types: Cigarettes    Quit date: 01/16/2008    Years since quitting: 12.3  . Smokeless tobacco: Never Used  Vaping Use  . Vaping Use: Never used  Substance and Sexual Activity  . Alcohol use: Not Currently    Comment: occasional  . Drug use: No  . Sexual activity: Yes  Other Topics Concern  . 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  ROS:   Please see the history of present illness.    All other systems reviewed and are negative.  EKGs/Labs/Other Studies Reviewed:    The following studies were reviewed today:   EKG:  The ekg ordered today demonstrates sinus rhythm, right bundle branch block, QTC of 460 ms in the setting of a QRS duration of 140 ms.  Recent Labs: 03/13/2020: ALT 22; Hemoglobin 13.3; Platelets 244; TSH 2.850 04/23/2020: BUN 23; Creatinine, Ser 1.62; Magnesium 2.1; Potassium 4.8;  Sodium 134  Recent Lipid Panel    Component Value Date/Time   CHOL 162 01/13/2015 0854   CHOL 112 01/12/2013 0013   TRIG 62 01/13/2015 0854   TRIG 80 01/12/2013 0013   HDL 47 01/12/2013 0013   VLDL 12 01/13/2015 0854   VLDL 16 01/12/2013 0013   LDLCALC 49 01/12/2013 0013    Physical Exam:    VS:  BP 128/64 (BP Location: Left Arm, Patient Position: Sitting, Cuff Size: Normal)   Pulse (!) 50   Ht 5' 11"  (1.803 m)   Wt 265 lb (120.2 kg)   SpO2 98%   BMI 36.96 kg/m     Wt Readings from Last 3 Encounters:  06/03/20 265 lb (120.2 kg)  05/29/20 263 lb (119.3 kg)  04/23/20 262 lb 3.2 oz (118.9 kg)     GEN:  Well nourished, well developed in no acute distress HEENT: Normal NECK: No JVD; No carotid bruits LYMPHATICS: No lymphadenopathy CARDIAC: RRR, no murmurs, rubs, gallops RESPIRATORY:  Clear to auscultation without rales, wheezing or rhonchi  ABDOMEN: Soft, non-tender, non-distended MUSCULOSKELETAL:  No edema; No deformity  SKIN: Warm and dry NEUROLOGIC:  Alert and oriented x 3 PSYCHIATRIC:  Normal affect   ASSESSMENT:    1. Paroxysmal atrial fibrillation (HCC)   2. Typical atrial flutter  ([place])   3. Encounter for long-term current use of high risk medication    PLAN:    In order of problems listed above:  1. Symptomatic paroxysmal atrial fibrillation and flutter Maintaining sinus rhythm on dofetilide 500 mcg twice daily.  QTC acceptable.  Given history of stable mildly elevated creatinine, will plan to recheck today. Continue Xarelto for stroke prophylaxis. Check basic metabolic panel today with refill.  See back in 3 months.   Medication Adjustments/Labs and Tests Ordered: Current medicines are reviewed at length with the patient today.  Concerns regarding medicines are outlined above.  Orders Placed This Encounter  Procedures  . Basic Metabolic Panel (BMET)  . Magnesium  . EKG 12-Lead   No orders of the defined types were placed in this encounter.    Signed, Lars Mage, MD, [facility]  06/03/2020 10:42 AM    Electrophysiology  [facility]

## 2020-06-04 LAB — BASIC METABOLIC PANEL
BUN/Creatinine Ratio: 20 (ref 10–24)
BUN: 30 mg/dL — ABNORMAL HIGH (ref 8–27)
CO2: 17 mmol/L — ABNORMAL LOW (ref 20–29)
Calcium: 8.8 mg/dL (ref 8.6–10.2)
Chloride: 104 mmol/L (ref 96–106)
Creatinine, Ser: 1.53 mg/dL — ABNORMAL HIGH (ref 0.76–1.27)
Glucose: 289 mg/dL — ABNORMAL HIGH (ref 65–99)
Potassium: 4.9 mmol/L (ref 3.5–5.2)
Sodium: 138 mmol/L (ref 134–144)
eGFR: 50 mL/min/{1.73_m2} — ABNORMAL LOW (ref >59)

## 2020-06-04 LAB — MAGNESIUM: Magnesium: 2 mg/dL (ref 1.6–2.3)

## 2020-06-16 ENCOUNTER — Other Ambulatory Visit: Payer: Self-pay | Admitting: Cardiovascular Disease

## 2020-06-17 ENCOUNTER — Ambulatory Visit: Payer: Medicare Other | Admitting: Cardiology

## 2020-06-23 NOTE — Progress Notes (Signed)
Cardiology Office Note  Date:  06/24/2020   ID:  Douglas Hunter, Douglas Hunter [birth year], MRN [id number]  PCP:  Sofie Hartigan, MD   Chief Complaint  Patient presents with  . 6 month follow up     "doing well." Medications reviewed by the patient verbally.     HPI:  Douglas Hunter is a pleasant 65 -year-old gentleman with  CAD,  non-STEMI with admission to the hospital in late April 2012  cardiac catheterization showing severe mid left circumflex disease, subtotally occluded, with collaterals from the RCA to the LAD circumflex,  normal ejection fraction  sleep apnea but has not had a sleep study.  episodes of atrial flutter. He reports symptoms dating back into his 55s.  hospitalizations for atrial flutter, 08/11/2012 and on 01/11/2013. medication for cardioversion.  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 Rayann Heman,  for typical atrial flutter, atrial fibrillation.   Prior Echocardiogram shows normal LV systolic function, essentially normal study, May 2013   PMH:   has a past medical history of AKI (acute kidney injury) ([place]) (03/13/2020), Atrial flutter ([place]), Coronary artery disease (08/2011), GERD (gastroesophageal reflux  disease), History of echocardiogram, Hyperlipidemia, Hypertension, Myocardial infarction [facility]), Paroxysmal atrial fibrillation ([place]), Poorly controlled diabetes mellitus ([place]), RBBB, and Sleep apnea.  PSH:    Past Surgical History:  Procedure Laterality Date  . ABLATION  01/29/2013   PVI and CTI by Dr Rayann Heman for atrial flutter and paroxysmal atrial fibrillation  . ANTERIOR CERVICAL DECOMP/DISCECTOMY FUSION N/A 04/18/2018   Procedure: ANTERIOR CERVICAL DECOMPRESSION/DISCECTOMY FUSION 2 LEVELS C5-7;  Surgeon: Meade Maw, MD;  Location: [facility] ORS;  Service: Neurosurgery;  Laterality: N/A;  . ATRIAL FIBRILLATION ABLATION N/A 01/29/2013   Procedure: ATRIAL FIBRILLATION ABLATION;  Surgeon: Coralyn Mark, MD;  Location: [facility] CATH LAB;  Service: Cardiovascular;  Laterality: N/A;  . CARDIAC CATHETERIZATION  08/03/2011  . COLONOSCOPY WITH PROPOFOL N/A 02/02/2016   Procedure: COLONOSCOPY WITH PROPOFOL;  Surgeon: Lucilla Lame, MD;  Location: [facility] ENDOSCOPY;  Service: Endoscopy;  Laterality: N/A;  . LUMBAR LAMINECTOMY/DECOMPRESSION MICRODISCECTOMY N/A 06/07/2017   Procedure: LUMBAR LAMINECTOMY/DECOMPRESSION MICRODISCECTOMY 2 LEVELS-L3-4,L4-5;  Surgeon: Meade Maw, MD;  Location: [facility] ORS;  Service: Neurosurgery;  Laterality: N/A;  . RIGHT/LEFT HEART CATH AND CORONARY ANGIOGRAPHY N/A 03/06/2020   Procedure: RIGHT/LEFT HEART CATH AND CORONARY ANGIOGRAPHY;  Surgeon: Minna Merritts, MD;  Location: [facility] CV LAB;  Service: Cardiovascular;  Laterality: N/A;  . TEE WITHOUT CARDIOVERSION N/A 01/28/2013   Procedure: TRANSESOPHAGEAL ECHOCARDIOGRAM (TEE);  Surgeon: Lelon Perla, MD;  Location: [facility] ENDOSCOPY;  Service: Cardiovascular;  Laterality: N/A;    Current Outpatient Medications  Medication Sig Dispense Refill  . acetaminophen (TYLENOL) 500 MG tablet Take 500 mg by mouth every  6 (six) hours as needed for mild pain or moderate pain.     Marland Kitchen amLODipine (NORVASC) 5 MG tablet Take 1 tablet (5 mg  total) by mouth daily. 30 tablet 6  . atorvastatin (LIPITOR) 80 MG tablet Take 80 mg by mouth at bedtime.    . benazepril (LOTENSIN) 40 MG tablet Take 1 tablet (40 mg total) by mouth daily. 30 tablet 3  . cholecalciferol (VITAMIN D3) 25 MCG (1000 UNIT) tablet Take 1,000 Units by mouth daily.    . Continuous Blood Gluc Sensor (FREESTYLE LIBRE 14 DAY SENSOR) MISC SMARTSIG:1 Kit(s) Topical Every 2 Weeks    . dofetilide (TIKOSYN) 500 MCG capsule Take 1 capsule (500 mcg total) by mouth 2 (two) times daily. 60 capsule 6  . ezetimibe (ZETIA) 10 MG tablet TAKE 1 TABLET(10 MG) BY MOUTH DAILY 90 tablet 0  . furosemide (LASIX) 20 MG tablet TAKE 1 TABLET(20 MG) BY MOUTH TWICE DAILY AS NEEDED 180 tablet 3  . insulin glargine (LANTUS) 100 UNIT/ML injection Inject 65 Units into the skin daily.    . insulin lispro (HUMALOG) 100 UNIT/ML KiwkPen Inject 10-16 Units into the skin 3 (three) times daily with meals. Sliding Scale    . INSULIN SYRINGE 1CC/29G 29G X 1/2" 1 ML MISC USE AS DIRECTED 100 each PRN  . methimazole (TAPAZOLE) 10 MG tablet Take 2 tablets (20 mg total) by mouth 2 (two) times daily. 120 tablet 1  . metoprolol succinate (TOPROL-XL) 25 MG 24 hr tablet Take 0.5 tablets (12.5 mg total) by mouth at bedtime. 15 tablet 6  . nitroGLYCERIN (NITROSTAT) 0.4 MG SL tablet Place 1 tablet (0.4 mg total) under the tongue every 5 (five) minutes as needed for chest pain. 25 tablet 3  . ONE TOUCH ULTRA TEST test strip TEST THREE TIMES A DAY AS DIRECTED. 100 each 12  . rivaroxaban (XARELTO) 20 MG TABS tablet Take 1 tablet (20 mg total) by mouth daily with supper. 28 tablet 0    Allergies:   Patient has no known allergies.   Social History:  The patient  reports that he quit smoking about 12 years ago. His smoking use included cigarettes. He has a 52.50 pack-year smoking history. He has never used smokeless tobacco. He reports previous alcohol use. He reports that he does not use drugs.   Family History:   family  history includes Cancer in his father; Diabetes in his son; Heart attack in his maternal uncle; Heart attack (age of onset: 46) in his father; Heart disease in his mother and paternal grandfather; Stroke in his mother.    Review of Systems: Review of Systems  Constitutional: Negative.   HENT: Negative.   Respiratory: Negative.   Cardiovascular: Negative.   Gastrointestinal: Negative.   Musculoskeletal: Negative.   Neurological: Negative.   Psychiatric/Behavioral: Negative.   All other systems reviewed and are negative.   PHYSICAL EXAM: VS:  BP 132/72 (BP Location: Left Arm, Patient Position: Sitting, Cuff Size: Normal)   Pulse (!) 54   Ht '5\' 11"'  (1.803 m)   Wt 263 lb 6 oz (119.5 kg)   SpO2 99%   BMI 36.73 kg/m  , BMI Body mass index is 36.73 kg/m. Constitutional:  oriented to person, place, and time. No distress.  HENT:  Head: Grossly normal Eyes:  no discharge. No scleral icterus.  Neck: No JVD, no carotid bruits  Cardiovascular: Regular rate and rhythm, no murmurs appreciated Pulmonary/Chest: Clear to auscultation bilaterally, no wheezes or rails Abdominal: Soft.  no  distension.  no tenderness.  Musculoskeletal: Normal range of motion Neurological:  normal muscle tone. Coordination normal. No atrophy Skin: Skin warm and dry Psychiatric: normal affect, pleasant  Recent Labs: 03/13/2020: ALT 22; Hemoglobin 13.3; Platelets 244; TSH 2.850 06/03/2020: BUN 30; Creatinine, Ser 1.53; Magnesium 2.0; Potassium 4.9; Sodium 138    Lipid Panel Lab Results  Component Value Date   CHOL 162 01/13/2015   HDL 47 01/12/2013   LDLCALC 49 01/12/2013   TRIG 62 01/13/2015      Wt Readings from Last 3 Encounters:  06/24/20 263 lb 6 oz (119.5 kg)  06/03/20 265 lb (120.2 kg)  05/29/20 263 lb (119.3 kg)     ASSESSMENT AND PLAN:  Paroxysmal atrial fibrillation (HCC)   compliant with his xarelto On tykosin/metoprolol maintaining NSR  Leg weakness Has some, but better after  surgery Neuropathy, prior back surgery  Coronary artery disease involving native coronary artery of native heart without angina pectoris Currently with no symptoms of angina. No further workup at this time. Continue current medication regimen.  Essential hypertension Blood pressure is well controlled on today's visit. No changes made to the medications.  Mixed hyperlipidemia Cholesterol is at goal on the current lipid regimen. No changes to the medications were made.  Type 2 diabetes mellitus with other circulatory complication, with long-term current use of insulin (HCC) Changing his diet, long history of poor control diabetes, neuropathy in the legs, renal dysfunction stage III kidney failure Stressed importance of aggressive diabetes control and working closely with endocrinology    Total encounter time more than 25 minutes  Greater than 50% was spent in counseling and coordination of care with the patient     Orders Placed This Encounter  Procedures  . EKG 12-Lead     Signed, Esmond Plants, M.D., Ph.D. 06/24/2020  [place], [place]

## 2020-06-24 ENCOUNTER — Other Ambulatory Visit: Payer: Self-pay

## 2020-06-24 ENCOUNTER — Ambulatory Visit (INDEPENDENT_AMBULATORY_CARE_PROVIDER_SITE_OTHER): Payer: Medicare Other | Admitting: Cardiovascular Disease

## 2020-06-24 ENCOUNTER — Encounter: Payer: Self-pay | Admitting: Cardiovascular Disease

## 2020-06-24 VITALS — BP 132/72 | HR 54 | Ht 71.0 in | Wt 263.4 lb

## 2020-06-24 DIAGNOSIS — I483 Typical atrial flutter: Secondary | ICD-10-CM

## 2020-06-24 DIAGNOSIS — I480 Paroxysmal atrial fibrillation: Secondary | ICD-10-CM

## 2020-06-24 DIAGNOSIS — I10 Essential (primary) hypertension: Secondary | ICD-10-CM

## 2020-06-24 DIAGNOSIS — G4733 Obstructive sleep apnea (adult) (pediatric): Secondary | ICD-10-CM | POA: Diagnosis not present

## 2020-06-24 DIAGNOSIS — I25118 Atherosclerotic heart disease of native coronary artery with other forms of angina pectoris: Secondary | ICD-10-CM

## 2020-06-24 DIAGNOSIS — E782 Mixed hyperlipidemia: Secondary | ICD-10-CM

## 2020-06-24 MED ORDER — SILDENAFIL CITRATE 20 MG PO TABS: 20.0000 mg | ORAL_TABLET | Freq: Three times a day (TID) | ORAL | 3 refills | Status: DC | PRN

## 2020-06-24 NOTE — Patient Instructions (Addendum)
Try citracel Doris Cheadle Alvera Singh cubes/water, [place] every day  Medication Instructions:  No changes  If you need a refill on your cardiac medications before your next appointment, please call your pharmacy.    Lab work: No new labs needed   If you have labs (blood work) drawn today and your tests are completely normal, you will receive your results only by: Marland Kitchen MyChart Message (if you have MyChart) OR . A paper copy in the mail If you have any lab test that is abnormal or we need to change your treatment, we will call you to review the results.   Testing/Procedures: No new testing needed   Follow-Up: At [facility], you and your health needs are our priority.  As part of our continuing mission to provide you with exceptional heart care, we have created designated Provider Care Teams.  These Care Teams include your primary Cardiologist (physician) and Advanced Practice Providers (APPs -  Physician Assistants and Nurse Practitioners) who all work together to provide you with the care you need, when you need it.  . You will need a follow up appointment in 6 months  . Providers on your designated Care Team:   . Murray Hodgkins, NP . Christell Faith, PA-C . Marrianne Mood, PA-C  Any Other Special Instructions Will Be Listed Below (If Applicable).  COVID-19 Vaccine Information can be found at: ShippingScam.co.uk For questions related to vaccine distribution or appointments, please email [email or web address] [email or web address] or call [phone].

## 2020-07-30 ENCOUNTER — Other Ambulatory Visit: Payer: Self-pay | Admitting: Cardiovascular Disease

## 2020-08-26 ENCOUNTER — Other Ambulatory Visit: Payer: Self-pay | Admitting: Cardiovascular Disease

## 2020-09-05 ENCOUNTER — Other Ambulatory Visit: Payer: Self-pay

## 2020-09-05 ENCOUNTER — Encounter: Payer: Self-pay | Admitting: Emergency Medicine

## 2020-09-05 ENCOUNTER — Emergency Department
Admission: EM | Admit: 2020-09-05 | Discharge: 2020-09-05 | Disposition: A | Discharge status: Home / Self Care | Payer: Medicare Other | Attending: Emergency Medicine | Admitting: Emergency Medicine

## 2020-09-05 DIAGNOSIS — M62830 Muscle spasm of back: Secondary | ICD-10-CM | POA: Diagnosis not present

## 2020-09-05 DIAGNOSIS — E785 Hyperlipidemia, unspecified: Secondary | ICD-10-CM | POA: Diagnosis not present

## 2020-09-05 DIAGNOSIS — Z87891 Personal history of nicotine dependence: Secondary | ICD-10-CM | POA: Insufficient documentation

## 2020-09-05 DIAGNOSIS — M545 Low back pain, unspecified: Secondary | ICD-10-CM | POA: Diagnosis present

## 2020-09-05 DIAGNOSIS — Z7901 Long term (current) use of anticoagulants: Secondary | ICD-10-CM | POA: Insufficient documentation

## 2020-09-05 DIAGNOSIS — I10 Essential (primary) hypertension: Secondary | ICD-10-CM | POA: Diagnosis not present

## 2020-09-05 DIAGNOSIS — E1169 Type 2 diabetes mellitus with other specified complication: Secondary | ICD-10-CM | POA: Insufficient documentation

## 2020-09-05 DIAGNOSIS — I4891 Unspecified atrial fibrillation: Secondary | ICD-10-CM | POA: Diagnosis not present

## 2020-09-05 DIAGNOSIS — Z794 Long term (current) use of insulin: Secondary | ICD-10-CM | POA: Insufficient documentation

## 2020-09-05 DIAGNOSIS — Z79899 Other long term (current) drug therapy: Secondary | ICD-10-CM | POA: Insufficient documentation

## 2020-09-05 DIAGNOSIS — I2510 Atherosclerotic heart disease of native coronary artery without angina pectoris: Secondary | ICD-10-CM | POA: Diagnosis not present

## 2020-09-05 MED ORDER — METHOCARBAMOL 500 MG PO TABS
1000.0000 mg | ORAL_TABLET | Freq: Once | ORAL | Status: AC
  Administered 2020-09-05: 1000 mg via ORAL
  Filled 2020-09-05: qty 2

## 2020-09-05 MED ORDER — OXYCODONE-ACETAMINOPHEN 7.5-325 MG PO TABS
1.0000 | ORAL_TABLET | Freq: Once | ORAL | Status: AC
  Administered 2020-09-05: 1 via ORAL
  Filled 2020-09-05: qty 1

## 2020-09-05 MED ORDER — OXYCODONE-ACETAMINOPHEN 7.5-325 MG PO TABS: 1.0000 | ORAL_TABLET | Freq: Four times a day (QID) | ORAL | 0 refills | Status: DC | PRN

## 2020-09-05 MED ORDER — METHOCARBAMOL 500 MG PO TABS
ORAL_TABLET | ORAL | 0 refills | Status: DC
Start: 2020-09-05 — End: 2020-09-23

## 2020-09-05 NOTE — ED Triage Notes (Signed)
Lower back pain, worse since yesterday morning. Has history of back surgeries.

## 2020-09-05 NOTE — Discharge Instructions (Addendum)
Follow-up with your primary care provider or Dr. Cari Caraway as needed for your back.  A prescription for methocarbamol was sent to your pharmacy.  This is a muscle relaxant and you may take 1 or 2 every 6 hours as needed for muscle spasms.  This with the pain medication could cause drowsiness so be aware.  Do not drive or operate machinery while taking these 2 medications as it can increase your risk for falling or further injury.  You may use ice or heat to your back as needed for discomfort.  Return to the emergency department if any severe worsening of your symptoms especially if you lose control of your bowel or bladder.

## 2020-09-05 NOTE — ED Notes (Signed)
Pt verbalizes relief with medications. Pt verbalizes understanding of d/c instructions, medications and follow up

## 2020-09-05 NOTE — ED Notes (Signed)
Pt reports lower back pain. Pt states has chronic back problems anyway from surgeries. Pt reports the only way to stop the pain is to lay down. Denies re-injuries

## 2020-09-05 NOTE — ED Provider Notes (Signed)
[facility] Emergency Department Provider Note   ____________________________________________   Event Date/Time   First MD Initiated Contact with Patient 09/05/20 [phone]     (approximate)  I have reviewed the triage vital signs and the nursing notes.   HISTORY  Chief Complaint Back Pain   HPI Douglas Hunter is a 65 y.o. male presents to the ED with complaint of low back pain that started yesterday morning.  Patient denies any recent injury.  He has had surgery on his back in the past.  He denies any radiation into his lower extremities, dysuria, frequency, incontinence of bowel or bladder.  Patient took wife's baclofen 4 mg and tramadol 50 mg last evening and early this morning without any relief of his back pain.  Patient continues to be ambulatory.  He rates his pain as an 8 out of 10.       Past Medical History:  Diagnosis Date  . AKI (acute kidney injury) ([place]) 03/13/2020  . Atrial flutter ([place])   . Coronary artery disease 08/2011   a. NSTEMI 08/2011, LHC w/ severe disease of LCx with good collaterals, lesion was high-risk for intervention due to a steep angulation of the LCx coming from the left main coronary artery as well as heavy calcifications, discused at cath conference w/ consensus advising med Rx; b. 03/2020 Cath: LM nl, LAD 37m LCX 100p w/ R->L and L->L collats. RCA dominant, nl. EF 55%. PA 33/6/19, PCWP 8.  .Marland KitchenGERD (gastroesophageal reflux disease)   . History of echocardiogram    a. 01/2020 Echo: EF 60-65%, no rwma. Sev elev PASP. Mod dil LA.  .Marland KitchenHyperlipidemia   . Hypertension   . Myocardial infarction ([place]   . Paroxysmal atrial fibrillation ([place]    a. s/p ablation 01/2013; b CHADS2VASc => 3 (HTN, DM, vascular disease); c. on Coumadin; d. 12/2019 [place] d/c'd 2/2 hyperthyroidism.  .Marland KitchenPoorly controlled diabetes mellitus ([place]   . RBBB   . Sleep apnea     Patient Active Problem List   Diagnosis Date Noted  . Secondary hypercoagulable  state ([place] 04/13/2020  . Rapid atrial fibrillation ([place] 03/13/2020  . Postural dizziness with presyncope 03/13/2020  . AKI (acute kidney injury) ([place] 03/13/2020  . Metabolic acidosis 151/05/5850 . Elevated troponin 03/13/2020  . Atrial fibrillation with rapid ventricular response ([place] 03/13/2020  . Unstable angina ([place] 03/06/2020  . Cervical radiculopathy 04/18/2018  . Obesity, unspecified 07/19/2017  . Paroxysmal atrial fibrillation (HCC)   . Poorly controlled diabetes mellitus ([facility] [facility]   . Special screening for malignant neoplasms, colon   . Polyp of sigmoid colon   . Benign neoplasm of ascending colon   . Erectile dysfunction 12/10/2015  . Gastroesophageal reflux disease without esophagitis 12/03/2015  . Snoring 01/23/2013  . Atrial flutter ([place] 08/20/2012  . Chest pain 08/19/2011  . HTN (hypertension) 08/19/2011  . HLD (hyperlipidemia) 08/19/2011  . Type 2 diabetes mellitus with hyperglycemia ([place] 08/19/2011  . Coronary artery disease 08/03/2011    Past Surgical History:  Procedure Laterality Date  . ABLATION  01/29/2013   PVI and CTI by Dr ARayann Hemanfor atrial flutter and paroxysmal atrial fibrillation  . ANTERIOR CERVICAL DECOMP/DISCECTOMY FUSION N/A 04/18/2018   Procedure: ANTERIOR CERVICAL DECOMPRESSION/DISCECTOMY FUSION 2 LEVELS C5-7;  Surgeon: YMeade Maw MD;  Location: [facility] ORS;  Service: Neurosurgery;  Laterality: N/A;  . ATRIAL FIBRILLATION ABLATION N/A 01/29/2013   Procedure: ATRIAL FIBRILLATION ABLATION;  Surgeon: JCoralyn Mark MD;  [facility];  Service: Cardiovascular;  Laterality: N/A;  . CARDIAC CATHETERIZATION  08/03/2011  . COLONOSCOPY WITH PROPOFOL N/A 02/02/2016   Procedure: COLONOSCOPY WITH PROPOFOL;  Surgeon: Lucilla Lame, MD;  Location: [facility] ENDOSCOPY;  Service: Endoscopy;  Laterality: N/A;  . LUMBAR LAMINECTOMY/DECOMPRESSION MICRODISCECTOMY N/A 06/07/2017   Procedure: LUMBAR LAMINECTOMY/DECOMPRESSION MICRODISCECTOMY 2 LEVELS-L3-4,L4-5;   Surgeon: Meade Maw, MD;  Location: [facility] ORS;  Service: Neurosurgery;  Laterality: N/A;  . RIGHT/LEFT HEART CATH AND CORONARY ANGIOGRAPHY N/A 03/06/2020   Procedure: RIGHT/LEFT HEART CATH AND CORONARY ANGIOGRAPHY;  Surgeon: Minna Merritts, MD;  Location: [facility] CV LAB;  Service: Cardiovascular;  Laterality: N/A;  . TEE WITHOUT CARDIOVERSION N/A 01/28/2013   Procedure: TRANSESOPHAGEAL ECHOCARDIOGRAM (TEE);  Surgeon: Lelon Perla, MD;  Location: [facility] ENDOSCOPY;  Service: Cardiovascular;  Laterality: N/A;    Prior to Admission medications   Medication Sig Start Date End Date Taking? Authorizing Provider  methocarbamol (ROBAXIN) 500 MG tablet 1-2 tablets every 6 hours prn muscle spasms 09/05/20  Yes Johnn Hai, PA-C  oxyCODONE-acetaminophen (PERCOCET) 7.5-325 MG tablet Take 1 tablet by mouth every 6 (six) hours as needed for severe pain. 09/05/20 09/05/21 Yes Johnn Hai, PA-C  acetaminophen (TYLENOL) 500 MG tablet Take 500 mg by mouth every 6 (six) hours as needed for mild pain or moderate pain.     [provider]  amLODipine (NORVASC) 5 MG tablet Take 1 tablet (5 mg total) by mouth daily. 04/17/20   Shirley Friar, PA-C  atorvastatin (LIPITOR) 80 MG tablet Take 80 mg by mouth at bedtime.    [provider]  benazepril (LOTENSIN) 40 MG tablet TAKE 1 TABLET(40 MG) BY MOUTH DAILY 07/30/20   Loel Dubonnet, NP  cholecalciferol (VITAMIN D3) 25 MCG (1000 UNIT) tablet Take 1,000 Units by mouth daily.    [provider]  Continuous Blood Gluc Sensor (FREESTYLE LIBRE 14 DAY SENSOR) MISC SMARTSIG:1 Kit(s) Topical Every 2 Weeks 03/29/20   [provider]  dofetilide (TIKOSYN) 500 MCG capsule Take 1 capsule (500 mcg total) by mouth 2 (two) times daily. 04/16/20   Shirley Friar, PA-C  ezetimibe (ZETIA) 10 MG tablet TAKE 1 TABLET(10 MG) BY MOUTH DAILY 06/16/20   Minna Merritts, MD  furosemide (LASIX) 20 MG tablet TAKE 1 TABLET(20  MG) BY MOUTH TWICE DAILY AS NEEDED 05/07/20   Minna Merritts, MD  insulin glargine (LANTUS) 100 UNIT/ML injection Inject 65 Units into the skin daily.    [provider]  insulin lispro (HUMALOG) 100 UNIT/ML KiwkPen Inject 10-16 Units into the skin 3 (three) times daily with meals. Sliding Scale 12/13/17 05/27/28  [provider]  INSULIN SYRINGE 1CC/29G 29G X 1/2" 1 ML MISC USE AS DIRECTED 05/27/15   Kathrine Haddock, NP  methimazole (TAPAZOLE) 10 MG tablet Take 2 tablets (20 mg total) by mouth 2 (two) times daily. 02/20/20   Marrianne Mood D, PA-C  metoprolol succinate (TOPROL-XL) 25 MG 24 hr tablet Take 0.5 tablets (12.5 mg total) by mouth at bedtime. 04/16/20   Shirley Friar, PA-C  nitroGLYCERIN (NITROSTAT) 0.4 MG SL tablet DISSOLVE 1 TABLET UNDER THE TONGUE EVERY 5 MINUTES AS NEEDED FOR CHEST PAIN 08/26/20   Minna Merritts, MD  ONE TOUCH ULTRA TEST test strip TEST THREE TIMES A DAY AS DIRECTED. 02/04/15   Kathrine Haddock, NP  rivaroxaban (XARELTO) 20 MG TABS tablet Take 1 tablet (20 mg total) by mouth daily with supper. 03/18/20   Vickie Epley, MD  sildenafil (REVATIO) 20  MG tablet Take 1 tablet (20 mg total) by mouth 3 (three) times daily as needed. 06/24/20   Minna Merritts, MD    Allergies Patient has no known allergies.  Family History  Problem Relation Age of Onset  . Heart attack Maternal Uncle   . Heart attack Father 43  . Cancer Father        throat  . Stroke Mother   . Heart disease Mother        pacemaker  . Diabetes Son   . Heart disease Paternal Grandfather        MI    Social History Social History   Tobacco Use  . Smoking status: Former Smoker    Packs/day: 1.50    Years: 35.00    Pack years: 52.50    Types: Cigarettes    Quit date: 01/16/2008    Years since quitting: 12.6  . Smokeless tobacco: Never Used  Vaping Use  . Vaping Use: Never used  Substance Use Topics  . Alcohol use: Not Currently    Comment: occasional  .  Drug use: No    Review of Systems Constitutional: No fever/chills Eyes: No visual changes. ENT: No sore throat. Cardiovascular: Denies chest pain. Respiratory: Denies shortness of breath. Gastrointestinal: No abdominal pain.  No nausea, no vomiting.  No diarrhea.  Genitourinary: Negative for dysuria. Musculoskeletal: Positive for back pain.  Positive for chronic back pain. Skin: Negative for rash. Neurological: Negative for headaches, focal weakness or numbness. ____________________________________________   PHYSICAL EXAM:  VITAL SIGNS: ED Triage Vitals  Enc Vitals Group     BP 09/05/20 0852 120/64     Pulse Rate 09/05/20 0852 (!) 52     Resp 09/05/20 0852 16     Temp --      Temp Source 09/05/20 0852 Oral     SpO2 09/05/20 0852 96 %     Weight 09/05/20 0850 263 lb 7.2 oz (119.5 kg)     Height 09/05/20 0850 5' 11"  (1.803 m)     Head Circumference --      Peak Flow --      Pain Score 09/05/20 0850 8     Pain Loc --      Pain Edu? --      Excl. in [place]? --     Constitutional: Alert and oriented. Well appearing and in no acute distress. Eyes: Conjunctivae are normal.  Head: Atraumatic. Neck: No stridor.   Cardiovascular: Normal rate, regular rhythm. Grossly normal heart sounds.  Good peripheral circulation. Respiratory: Normal respiratory effort.  No retractions. Lungs CTAB. Gastrointestinal: Soft and nontender. No distention. No abdominal bruits. No CVA tenderness.  Bowel sounds normoactive x4 quadrants. Musculoskeletal: On examination of the back there is no point tenderness on palpation of the thoracic or lumbar spine.  There is however mid to upper lumbar bilateral paravertebral muscle tenderness.  Muscles are noted to be moderately tight.  Range of motion is restricted secondary to discomfort.  Straight leg raises were negative.  Muscle strength bilateral lower extremities 5/5. Neurologic:  Normal speech and language.  Reflexes are 2+ bilaterally.  No gross focal  neurologic deficits are appreciated. No gait instability. Skin:  Skin is warm, dry and intact. No rash noted. Psychiatric: Mood and affect are normal. Speech and behavior are normal.  ____________________________________________   LABS (all labs ordered are listed, but only abnormal results are displayed)  Labs Reviewed - No data to display  PROCEDURES  Procedure(s) performed (including Critical Care):  Procedures   ____________________________________________   INITIAL IMPRESSION / ASSESSMENT AND PLAN / ED COURSE  As part of my medical decision making, I reviewed the following data within the electronic MEDICAL RECORD NUMBER Notes from prior ED visits and [place] Controlled Substance Database  65 year old male presents to the ED with complaint of low back pain that began yesterday morning and has gotten worse since.  Patient did try his wife's baclofen and tramadol last evening without any relief.  Patient denies any urinary symptoms or history of kidney stones.  He does have history of chronic back problems from previous surgeries and has seen Dr. Cari Caraway in the past.  Patient is moderately tender on palpation of the muscles bilaterally but no point tenderness on palpation of the vertebral bodies.  Patient was given Percocet 7.5 and methocarbamol 1000 mg p.o. and was feeling much better prior to discharge.  Patient is to continue with oxycodone and the methocarbamol as needed for muscle spasms.  He will follow-up with his PCP or surgeon if any continued problems.  ____________________________________________   FINAL CLINICAL IMPRESSION(S) / ED DIAGNOSES  Final diagnoses:  Muscle spasm of back     ED Discharge Orders         Ordered    oxyCODONE-acetaminophen (PERCOCET) 7.5-325 MG tablet  Every 6 hours PRN        09/05/20 1040    methocarbamol (ROBAXIN) 500 MG tablet        09/05/20 1040           Note:  This document was prepared using Dragon voice recognition software and  may include unintentional dictation errors.    Johnn Hai, PA-C 09/05/20 1336    Lavonia Drafts, MD 09/05/20 [phone]

## 2020-09-06 ENCOUNTER — Other Ambulatory Visit: Payer: Self-pay | Admitting: Cardiovascular Disease

## 2020-09-23 ENCOUNTER — Encounter: Payer: Self-pay | Admitting: Cardiology

## 2020-09-23 ENCOUNTER — Other Ambulatory Visit: Payer: Self-pay

## 2020-09-23 ENCOUNTER — Ambulatory Visit: Payer: Medicare Other | Admitting: Cardiology

## 2020-09-23 VITALS — BP 140/80 | HR 52 | Ht 71.0 in | Wt 262.0 lb

## 2020-09-23 DIAGNOSIS — I480 Paroxysmal atrial fibrillation: Secondary | ICD-10-CM | POA: Diagnosis not present

## 2020-09-23 DIAGNOSIS — Z79899 Other long term (current) drug therapy: Secondary | ICD-10-CM

## 2020-09-23 DIAGNOSIS — I483 Typical atrial flutter: Secondary | ICD-10-CM

## 2020-09-23 DIAGNOSIS — E0590 Thyrotoxicosis, unspecified without thyrotoxic crisis or storm: Secondary | ICD-10-CM | POA: Diagnosis not present

## 2020-09-23 NOTE — Progress Notes (Signed)
Electrophysiology Office Follow up Visit Note:    Date:  09/23/2020   ID:  Douglas Hunter, DOB [birth year], MRN [id number]  PCP:  Sofie Hartigan, MD  [facility] Cardiologist:  [name], MD  [facility] Electrophysiologist:  Vickie Epley, MD    Interval History:    Douglas Hunter is a 65 y.o. male who presents for a follow up visit.  I last saw the patient June 03, 2020 for his symptomatic paroxysmal atrial fibrillation and flutter.  He is maintained on Tikosyn 500 mcg twice daily.  He is on Xarelto for stroke prophylaxis.  Since I last saw the patient his [facility] endocrinology team is noted a elevated TSH and they are working to get this corrected.  He has had no atrial fibrillation since I last saw him.    Past Medical History:  Diagnosis Date   AKI (acute kidney injury) ([place]) 03/13/2020   Atrial flutter ([place])    Coronary artery disease 08/2011   a. NSTEMI 08/2011, LHC w/ severe disease of LCx with good collaterals, lesion was high-risk for intervention due to a steep angulation of the LCx coming from the left main coronary artery as well as heavy calcifications, discused at cath conference w/ consensus advising med Rx; b. 03/2020 Cath: LM nl, LAD 61m LCX 100p w/ R->L and L->L collats. RCA dominant, nl. EF 55%. PA 33/6/19, PCWP 8.   GERD (gastroesophageal reflux disease)    History of echocardiogram    a. 01/2020 Echo: EF 60-65%, no rwma. Sev elev PASP. Mod dil LA.   Hyperlipidemia    Hypertension    Myocardial infarction ([facility]    Paroxysmal atrial fibrillation ([place]    a. s/p ablation 01/2013; b CHADS2VASc => 3 (HTN, DM, vascular disease); c. on Coumadin; d. 12/2019 Amio d/c'd 2/2 hyperthyroidism.   Poorly controlled diabetes mellitus ([facility]    RBBB    Sleep apnea     Past Surgical History:  Procedure Laterality Date   ABLATION  01/29/2013   PVI and CTI by Dr ARayann Hemanfor atrial flutter and paroxysmal atrial fibrillation   ANTERIOR CERVICAL  DECOMP/DISCECTOMY FUSION N/A 04/18/2018   Procedure: ANTERIOR CERVICAL DECOMPRESSION/DISCECTOMY FUSION 2 LEVELS C5-7;  Surgeon: YMeade Maw MD;  Location: [facility] ORS;  Service: Neurosurgery;  Laterality: N/A;   ATRIAL FIBRILLATION ABLATION N/A 01/29/2013   Procedure: ATRIAL FIBRILLATION ABLATION;  Surgeon: JCoralyn Mark MD;  [facility];  Service: Cardiovascular;  Laterality: N/A;   CARDIAC CATHETERIZATION  08/03/2011   COLONOSCOPY WITH PROPOFOL N/A 02/02/2016   Procedure: COLONOSCOPY WITH PROPOFOL;  Surgeon: DLucilla Lame MD;  Location: [facility] ENDOSCOPY;  Service: Endoscopy;  Laterality: N/A;   LUMBAR LAMINECTOMY/DECOMPRESSION MICRODISCECTOMY N/A 06/07/2017   Procedure: LUMBAR LAMINECTOMY/DECOMPRESSION MICRODISCECTOMY 2 [id number],A3-5  Surgeon: YMeade Maw MD;  Location: [facility] ORS;  Service: Neurosurgery;  Laterality: N/A;   RIGHT/LEFT HEART CATH AND CORONARY ANGIOGRAPHY N/A 03/06/2020   Procedure: RIGHT/LEFT HEART CATH AND CORONARY ANGIOGRAPHY;  Surgeon: GMinna Merritts MD;  [facility];  Service: Cardiovascular;  Laterality: N/A;   TEE WITHOUT CARDIOVERSION N/A 01/28/2013   Procedure: TRANSESOPHAGEAL ECHOCARDIOGRAM (TEE);  Surgeon: BLelon Perla MD;  [facility];  Service: Cardiovascular;  Laterality: N/A;    Current Medications: Current Meds  Medication Sig   acetaminophen (TYLENOL) 500 MG tablet Take 500 mg by mouth every 6 (six) hours as needed for mild pain or moderate pain.    amLODipine (NORVASC) 5 MG tablet Take 1 tablet (5  mg total) by mouth daily.   atorvastatin (LIPITOR) 80 MG tablet Take 80 mg by mouth at bedtime.   benazepril (LOTENSIN) 40 MG tablet TAKE 1 TABLET(40 MG) BY MOUTH DAILY   cholecalciferol (VITAMIN D3) 25 MCG (1000 UNIT) tablet Take 1,000 Units by mouth daily.   Continuous Blood Gluc Sensor (FREESTYLE LIBRE 14 DAY SENSOR) MISC SMARTSIG:1 Kit(s) Topical Every 2 Weeks   dofetilide (TIKOSYN) 500 MCG capsule Take  1 capsule (500 mcg total) by mouth 2 (two) times daily.   ezetimibe (ZETIA) 10 MG tablet TAKE 1 TABLET(10 MG) BY MOUTH DAILY   furosemide (LASIX) 20 MG tablet TAKE 1 TABLET(20 MG) BY MOUTH TWICE DAILY AS NEEDED   insulin glargine (LANTUS) 100 UNIT/ML injection Inject 65 Units into the skin daily.   insulin lispro (HUMALOG) 100 UNIT/ML KiwkPen Inject 10-16 Units into the skin 3 (three) times daily with meals. Sliding Scale   INSULIN SYRINGE 1CC/29G 29G X 1/2" 1 ML MISC USE AS DIRECTED   metoprolol succinate (TOPROL-XL) 25 MG 24 hr tablet Take 0.5 tablets (12.5 mg total) by mouth at bedtime.   nitroGLYCERIN (NITROSTAT) 0.4 MG SL tablet DISSOLVE 1 TABLET UNDER THE TONGUE EVERY 5 MINUTES AS NEEDED FOR CHEST PAIN   ONE TOUCH ULTRA TEST test strip TEST THREE TIMES A DAY AS DIRECTED.   rivaroxaban (XARELTO) 20 MG TABS tablet Take 1 tablet (20 mg total) by mouth daily with supper.   sildenafil (REVATIO) 20 MG tablet Take 1 tablet (20 mg total) by mouth 3 (three) times daily as needed.     Allergies:   Patient has no known allergies.   Social History   Socioeconomic History   Marital status: Married    Spouse name: Not on file   Number of children: Not on file   Years of education: Not on file   Highest education level: Not on file  Occupational History   Not on file  Tobacco Use   Smoking status: Former    Packs/day: 1.50    Years: 35.00    Pack years: 52.50    Types: Cigarettes    Quit date: 01/16/2008    Years since quitting: 12.6   Smokeless tobacco: Never  Vaping Use   Vaping Use: Never used  Substance and Sexual Activity   Alcohol use: Not Currently    Comment: occasional   Drug use: No   Sexual activity: Yes  Other Topics Concern   Not on file  Social History Narrative   Pt lives in [place] (right outside of [place])   Works for a [employer] and exercise psychologist in [place] Strain: Not on [employer]  Insecurity: Not on file  Transportation Needs: Not on file  Physical Activity: Not on file  Stress: Not on file  Social Connections: Not on file     Family History: The patient's family history includes Cancer in his father; Diabetes in his son; Heart attack in his maternal uncle; Heart attack (age of onset: 29) in his father; Heart disease in his mother and paternal grandfather; Stroke in his mother.  ROS:   Please see the history of present illness.    All other systems reviewed and are negative.  EKGs/Labs/Other Studies Reviewed:    The following studies were reviewed today:    EKG:  The ekg ordered today demonstrates sinus rhythm with a single PAC.  QTc 445 ms.  Ventricular rate 52 bpm.  Recent  Labs: 03/13/2020: ALT 22; Hemoglobin 13.3; Platelets 244; TSH 2.850 06/03/2020: BUN 30; Creatinine, Ser 1.53; Magnesium 2.0; Potassium 4.9; Sodium 138  Recent Lipid Panel    Component Value Date/Time   CHOL 162 01/13/2015 0854   CHOL 112 01/12/2013 0013   TRIG 62 01/13/2015 0854   TRIG 80 01/12/2013 0013   HDL 47 01/12/2013 0013   VLDL 12 01/13/2015 0854   VLDL 16 01/12/2013 0013   LDLCALC 49 01/12/2013 0013    Physical Exam:    VS:  BP 140/80 (BP Location: Left Arm, Patient Position: Sitting, Cuff Size: Normal)   Pulse (!) 52   Ht _0  (1.803 m)   Wt 262 lb (118.8 kg)   SpO2 97%   BMI 36.54 kg/m     Wt Readings from Last 3 Encounters:  09/23/20 262 lb (118.8 kg)  09/05/20 263 lb 7.2 oz (119.5 kg)  06/24/20 263 lb 6 oz (119.5 kg)     GEN:  Well nourished, well developed in no acute distress HEENT: Normal NECK: No JVD; No carotid bruits LYMPHATICS: No lymphadenopathy CARDIAC: RRR, no murmurs, rubs, gallops RESPIRATORY:  Clear to auscultation without rales, wheezing or rhonchi  ABDOMEN: Soft, non-tender, non-distended MUSCULOSKELETAL:  No edema; No deformity  SKIN: Warm and dry NEUROLOGIC:  Alert and oriented x 3 PSYCHIATRIC:  Normal affect   ASSESSMENT:     1. Paroxysmal atrial fibrillation (HCC)   2. Typical atrial flutter ([place])   3. High risk medication use   4. Hyperthyroidism    PLAN:    In order of problems listed above:  1. Paroxysmal atrial fibrillation (HCC) Maintaining sinus rhythm on dofetilide 500 mcg twice daily.  QTC acceptable.  Repeat chemistry panel and magnesium today.  Follow-up 3 months.  2. Typical atrial flutter (HCC) No episodes of sustained arrhythmia.  3. High risk medication use See #1  4.  Hypothyroidism Working with [facility].  Have recently discontinued methimazole.  TSH was last in the 70s.  Follow-up 3 months.     Medication Adjustments/Labs and Tests Ordered: Current medicines are reviewed at length with the patient today.  Concerns regarding medicines are outlined above.  No orders of the defined types were placed in this encounter.  No orders of the defined types were placed in this encounter.    Signed, Lars Mage, MD, [facility], [facility] 09/23/2020 9:07 AM    Electrophysiology [facility]

## 2020-09-23 NOTE — Patient Instructions (Signed)
Medication Instructions:  Your physician recommends that you continue on your current medications as directed. Please refer to the Current Medication list given to you today.  *If you need a refill on your cardiac medications before your next appointment, please call your pharmacy*   Lab Work: Tikosyn surveillance labs today: BMET & Magnesium If you have labs (blood work) drawn today and your tests are completely normal, you will receive your results only by: [place] (if you have MyChart) OR A paper copy in the mail If you have any lab test that is abnormal or we need to change your treatment, we will call you to review the results.   Testing/Procedures: None ordered   Follow-Up: At [facility], you and your health needs are our priority.  As part of our continuing mission to provide you with exceptional heart care, we have created designated Provider Care Teams.  These Care Teams include your primary Cardiologist (physician) and Advanced Practice Providers (APPs -  Physician Assistants and Nurse Practitioners) who all work together to provide you with the care you need, when you need it.   Your next appointment:   3 month(s)  The format for your next appointment:   In Person  Provider:   Lars Mage, MD    Thank you for choosing [facility]!!

## 2020-09-24 LAB — BASIC METABOLIC PANEL
BUN/Creatinine Ratio: 19 (ref 10–24)
BUN: 26 mg/dL (ref 8–27)
CO2: 18 mmol/L — ABNORMAL LOW (ref 20–29)
Calcium: 9.1 mg/dL (ref 8.6–10.2)
Chloride: 103 mmol/L (ref 96–106)
Creatinine, Ser: 1.35 mg/dL — ABNORMAL HIGH (ref 0.76–1.27)
Glucose: 286 mg/dL — ABNORMAL HIGH (ref 65–99)
Potassium: 4.9 mmol/L (ref 3.5–5.2)
Sodium: 136 mmol/L (ref 134–144)
eGFR: 58 mL/min/{1.73_m2} — ABNORMAL LOW (ref >59)

## 2020-09-24 LAB — MAGNESIUM: Magnesium: 2.1 mg/dL (ref 1.6–2.3)

## 2020-10-01 ENCOUNTER — Ambulatory Visit: Payer: Medicare Other | Admitting: Pulmonary Disease

## 2020-10-01 ENCOUNTER — Other Ambulatory Visit: Payer: Self-pay

## 2020-10-01 ENCOUNTER — Encounter: Payer: Self-pay | Admitting: Pulmonary Disease

## 2020-10-01 VITALS — BP 122/80 | HR 57 | Temp 97.9°F | Ht 71.0 in | Wt 257.2 lb

## 2020-10-01 DIAGNOSIS — G4733 Obstructive sleep apnea (adult) (pediatric): Secondary | ICD-10-CM

## 2020-10-01 DIAGNOSIS — E669 Obesity, unspecified: Secondary | ICD-10-CM | POA: Diagnosis not present

## 2020-10-01 DIAGNOSIS — G4730 Sleep apnea, unspecified: Secondary | ICD-10-CM | POA: Diagnosis not present

## 2020-10-01 DIAGNOSIS — Z9989 Dependence on other enabling machines and devices: Secondary | ICD-10-CM

## 2020-10-01 NOTE — Progress Notes (Signed)
[facility]  Chief Complaint  Patient presents with   Follow-up    OSA-    Constitutional:  BP 122/80 (BP Location: Left Arm, Patient Position: Sitting, Cuff Size: Normal)   Pulse (!) 57   Temp 97.9 F (36.6 C) (Oral)   Ht '5\' 11"'  (1.803 m)   Wt 257 lb 3.2 oz (116.7 kg)   SpO2 99%   BMI 35.87 kg/m   Past Medical History:  A fib, DM, GERD, CAD, ED, HTN, HLD  Past Surgical History:  He  has a past surgical history that includes Cardiac catheterization (08/03/2011); TEE without cardioversion (N/A, 01/28/2013); Ablation (01/29/2013); atrial fibrillation ablation (N/A, 01/29/2013); Colonoscopy with propofol (N/A, 02/02/2016); Lumbar laminectomy/decompression microdiscectomy (N/A, 06/07/2017); Anterior cervical decomp/discectomy fusion (N/A, 04/18/2018); and RIGHT/LEFT HEART CATH AND CORONARY ANGIOGRAPHY (N/A, 03/06/2020).  Brief Summary:  Douglas Hunter is a 65 y.o. male with obstructive sleep apnea.      Subjective:   He had more trouble with A fib.  Echo showed elevated PASP.  He had RHC which was unremarkable.  Feels a fib is better since being on tikosyn.  Doing well with CPAP.  Uses nasal mask.  Not having sinus congestion, sore throat, dry mouth, or aerophagia.  Never got sublingual zolpidem filled - too expensive.  Feels his sleep pattern is better now and doesn't feel like he needs sleep aid medication.  He is getting about 6.5 to 7 hrs sleep per night.  Occasionally naps during the day.  Physical Exam:   Appearance - well kempt   ENMT - no sinus tenderness, no oral exudate, no LAN, Mallampati 3 airway, no stridor  Respiratory - equal breath sounds bilaterally, no wheezing or rales  CV - s1s2 regular rate and rhythm, no murmurs  Ext - no clubbing, no edema  Skin - no rashes  Psych - normal mood and affect   Sleep Tests:  HST 12/08/16 >> AHI 30, SpO2 low 81% Auto CPAP 08/31/20 to 09/29/20 >> used on 29 of 30 nights with  average 5 hrs 48 min.  Average AHI 1.6 with CPAP 14 cm H2O.  Cardiac Tests:  Echo 01/15/20 >> EF 60 to 65%, severe elevation in PASP, mod LA dilation RHC 03/06/20 >> PAOP 8, PA 33/6/19, RV 37/1/6, CO 5.14, CI 2.2  Social History:  He  reports that he quit smoking about 12 years ago. His smoking use included cigarettes. He has a 52.50 pack-year smoking history. He has never used smokeless tobacco. He reports previous alcohol use. He reports that he does not use drugs.  Family History:  His family history includes Cancer in his father; Diabetes in his son; Heart attack in his maternal uncle; Heart attack (age of onset: 56) in his father; Heart disease in his mother and paternal grandfather; Stroke in his mother.     Assessment/Plan:   Obstructive sleep apnea. - he is compliant with CPAP and reports benefit - he uses [facility] for his DME - continue CPAP 14 cm H2O  Obesity. - he is aware of how his weight can impact his health  Paroxysmal atrial fibrillation, atrial flutter. - followed by Dr. Lars Mage with [facility]  Time Spent Involved in Patient Care on Day of Examination:  22 minutes  Follow up:   Patient Instructions  Follow up in 1 year  Medication List:   Allergies as of 10/01/2020   No Known Allergies      Medication List  Accurate as of October 01, 2020  9:25 AM. If you have any questions, ask your nurse or doctor.          acetaminophen 500 MG tablet Commonly known as: TYLENOL Take 500 mg by mouth every 6 (six) hours as needed for mild pain or moderate pain.   amLODipine 5 MG tablet Commonly known as: NORVASC Take 1 tablet (5 mg total) by mouth daily.   atorvastatin 80 MG tablet Commonly known as: LIPITOR Take 80 mg by mouth at bedtime.   benazepril 40 MG tablet Commonly known as: LOTENSIN TAKE 1 TABLET(40 MG) BY MOUTH DAILY   cholecalciferol 25 MCG (1000 UNIT) tablet Commonly known as: VITAMIN D3 Take 1,000 Units by mouth  daily.   dofetilide 500 MCG capsule Commonly known as: TIKOSYN Take 1 capsule (500 mcg total) by mouth 2 (two) times daily.   ezetimibe 10 MG tablet Commonly known as: ZETIA TAKE 1 TABLET(10 MG) BY MOUTH DAILY   FreeStyle Libre 14 Day Sensor Misc SMARTSIG:1 Kit(s) Topical Every 2 Weeks   furosemide 20 MG tablet Commonly known as: LASIX TAKE 1 TABLET(20 MG) BY MOUTH TWICE DAILY AS NEEDED   insulin glargine 100 UNIT/ML injection Commonly known as: LANTUS Inject 65 Units into the skin daily.   insulin lispro 100 UNIT/ML KiwkPen Commonly known as: HUMALOG Inject 10-16 Units into the skin 3 (three) times daily with meals. Sliding Scale   INSULIN SYRINGE 1CC/29G 29G X 1/2" 1 ML Misc USE AS DIRECTED   metoprolol succinate 25 MG 24 hr tablet Commonly known as: TOPROL-XL Take 0.5 tablets (12.5 mg total) by mouth at bedtime.   nitroGLYCERIN 0.4 MG SL tablet Commonly known as: NITROSTAT DISSOLVE 1 TABLET UNDER THE TONGUE EVERY 5 MINUTES AS NEEDED FOR CHEST PAIN   ONE TOUCH ULTRA TEST test strip Generic drug: glucose blood TEST THREE TIMES A DAY AS DIRECTED.   rivaroxaban 20 MG Tabs tablet Commonly known as: Xarelto Take 1 tablet (20 mg total) by mouth daily with supper.   sildenafil 20 MG tablet Commonly known as: REVATIO Take 1 tablet (20 mg total) by mouth 3 (three) times daily as needed.        Signature:  Chesley Mires, MD [place] Pager - [phone] 10/01/2020, 9:25 AM

## 2020-10-01 NOTE — Patient Instructions (Signed)
Follow up in 1 year.

## 2020-10-07 ENCOUNTER — Other Ambulatory Visit: Payer: Self-pay | Admitting: Student

## 2020-10-23 ENCOUNTER — Other Ambulatory Visit: Payer: Self-pay | Admitting: Family

## 2020-10-23 NOTE — Telephone Encounter (Signed)
*  STAT* If patient is at the pharmacy, call can be transferred to refill team.   1. Which medications need to be refilled? (please list name of each medication and dose if known) benazepril 40 mg 1 tablet daily   2. Which pharmacy/location (including street and city if local pharmacy) is medication to be sent to? [facility] in [place]  3. Do they need a 30 day or 90 day supply? 90 day

## 2020-11-10 ENCOUNTER — Other Ambulatory Visit: Payer: Self-pay | Admitting: Student

## 2020-11-11 ENCOUNTER — Other Ambulatory Visit: Payer: Self-pay | Admitting: *Deleted

## 2020-11-11 ENCOUNTER — Other Ambulatory Visit (HOSPITAL_COMMUNITY): Payer: Self-pay

## 2020-11-11 MED ORDER — METOPROLOL SUCCINATE ER 25 MG PO TB24: 12.5000 mg | ORAL_TABLET | Freq: Every day | ORAL | 6 refills | Status: DC

## 2020-11-27 ENCOUNTER — Ambulatory Visit: Payer: Self-pay | Admitting: Urology

## 2020-12-01 NOTE — Progress Notes (Signed)
 12/02/2020 9:45 AM   Douglas Hunter 06/27/1955 [id number]  Referring provider: Feldpausch, Dale E, MD [address] [place],  [place] [address]  Chief Complaint  Patient presents with   Follow-up    HPI: 65 y.o. male presents for follow-up of a right hydrocele.  Initially seen 05/29/2020 with moderate right hemiscrotal swelling felt consistent with hydrocele Scrotal sonogram since 04/22/2020 showed a 6 cm multiloculated fluid collection with normal testis After discussing treatment options he elected observation and a 6-month follow-up was scheduled Since his last visit he feels the swelling has decreased in size and he is asymptomatic   PMH: Past Medical History:  Diagnosis Date   AKI (acute kidney injury) (HCC) 03/13/2020   Atrial flutter (HCC)    Coronary artery disease 08/2011   a. NSTEMI 08/2011, LHC w/ severe disease of LCx with good collaterals, lesion was high-risk for intervention due to a steep angulation of the LCx coming from the left main coronary artery as well as heavy calcifications, discused at cath conference w/ consensus advising med Rx; b. 03/2020 Cath: LM nl, LAD 50m, LCX 100p w/ R->L and L->L collats. RCA dominant, nl. EF 55%. PA 33/6/19, PCWP 8.   GERD (gastroesophageal reflux disease)    History of echocardiogram    a. 01/2020 Echo: EF 60-65%, no rwma. Sev elev PASP. Mod dil LA.   Hyperlipidemia    Hypertension    Myocardial infarction (HCC)    Paroxysmal atrial fibrillation (H[facility])    a. s/p ablation 01/2013; b CHADS2VASc => 3 (HTN, DM, vascular disease); c. on Coumadin; d. 12/2019 Amio d/c'd 2/2 hyperthyroidism.   Poorly controlled diabetes mellitus (HCC)    RBBB    Sleep apnea     Surgical History: Past Surgical History:  Procedure Laterality Date   ABLATION  01/29/2013   PVI and CTI by Dr Allred for atrial flutter and paroxysmal atrial fibrillation   ANTERIOR CERVICAL DECOMP/DISCECTOMY FUSION N/A 04/18/2018   Procedure: ANTERIOR CERVICAL  DECOMPRESSION/DISCECTOMY FUSION 2 LEVELS C5-7;  Surgeon: Yarbrough, Chester, MD;  Location: [facility] ORS;  Service: Neurosurgery;  Laterality: N/A;   ATRIAL FIBRILLATION ABLATION N/A 01/29/2013   Procedure: ATRIAL FIBRILLATION ABLATION;  Surgeon: James D Allred, MD;  [facility];  Service: Cardiovascular;  Laterality: N/A;   CARDIAC CATHETERIZATION  08/03/2011   COLONOSCOPY WITH PROPOFOL N/A 02/02/2016   Procedure: COLONOSCOPY WITH PROPOFOL;  Surgeon: Darren Wohl, MD;  Location: [facility] ENDOSCOPY;  Service: Endoscopy;  Laterality: N/A;   LUMBAR LAMINECTOMY/DECOMPRESSION MICRODISCECTOMY N/A 06/07/2017   Procedure: LUMBAR LAMINECTOMY/DECOMPRESSION MICRODISCECTOMY 2 LEVELS-L3-4,L4-5;  Surgeon: Yarbrough, Chester, MD;  Location: [facility] ORS;  Service: Neurosurgery;  Laterality: N/A;   RIGHT/LEFT HEART CATH AND CORONARY ANGIOGRAPHY N/A 03/06/2020   Procedure: RIGHT/LEFT HEART CATH AND CORONARY ANGIOGRAPHY;  Surgeon: Gollan, Timothy J, MD;  Location: [facility] INVASIVE CV LAB;  Service: Cardiovascular;  Laterality: N/A;   TEE WITHOUT CARDIOVERSION N/A 01/28/2013   Procedure: TRANSESOPHAGEAL ECHOCARDIOGRAM (TEE);  Surgeon: Brian S Crenshaw, MD;  [facility];  Service: Cardiovascular;  Laterality: N/A;    Home Medications:  Allergies as of 12/02/2020   No Known Allergies      Medication List        Accurate as of December 02, 2020  9:45 AM. If you have any questions, ask your nurse or doctor.          acetaminophen 500 MG tablet Commonly known as: TYLENOL Take 500 mg by mouth every 6 (six) hours as needed for mild pain   or moderate pain.   amLODipine 5 MG tablet Commonly known as: NORVASC TAKE 1 TABLET BY MOUTH ONCE DAILY   atorvastatin 80 MG tablet Commonly known as: LIPITOR Take 80 mg by mouth at bedtime.   benazepril 40 MG tablet Commonly known as: LOTENSIN TAKE 1 TABLET(40 MG) BY MOUTH DAILY   cholecalciferol 25 MCG (1000 UNIT) tablet Commonly known as: VITAMIN D3 Take 1,000  Units by mouth daily.   dofetilide 500 MCG capsule Commonly known as: TIKOSYN TAKE 1 CAPSULE BY MOUTH TWICE DAILY   ezetimibe 10 MG tablet Commonly known as: ZETIA TAKE 1 TABLET(10 MG) BY MOUTH DAILY   FreeStyle Libre 14 Day Sensor Misc SMARTSIG:1 Kit(s) Topical Every 2 Weeks   furosemide 20 MG tablet Commonly known as: LASIX TAKE 1 TABLET(20 MG) BY MOUTH TWICE DAILY AS NEEDED   insulin glargine 100 UNIT/ML injection Commonly known as: LANTUS Inject 65 Units into the skin daily.   insulin lispro 100 UNIT/ML KiwkPen Commonly known as: HUMALOG Inject 10-16 Units into the skin 3 (three) times daily with meals. Sliding Scale   INSULIN SYRINGE 1CC/29G 29G X 1/2" 1 ML Misc USE AS DIRECTED   metoprolol succinate 25 MG 24 hr tablet Commonly known as: TOPROL-XL Take 0.5 tablets (12.5 mg total) by mouth at bedtime.   nitroGLYCERIN 0.4 MG SL tablet Commonly known as: NITROSTAT DISSOLVE 1 TABLET UNDER THE TONGUE EVERY 5 MINUTES AS NEEDED FOR CHEST PAIN   ONE TOUCH ULTRA TEST test strip Generic drug: glucose blood TEST THREE TIMES A DAY AS DIRECTED.   rivaroxaban 20 MG Tabs tablet Commonly known as: Xarelto Take 1 tablet (20 mg total) by mouth daily with supper.   sildenafil 20 MG tablet Commonly known as: REVATIO Take 1 tablet (20 mg total) by mouth 3 (three) times daily as needed.        Allergies: No Known Allergies  Family History: Family History  Problem Relation Age of Onset   Heart attack Maternal Uncle    Heart attack Father 81   Cancer Father        throat   Stroke Mother    Heart disease Mother        pacemaker   Diabetes Son    Heart disease Paternal Grandfather        MI    Social History:  reports that he quit smoking about 12 years ago. His smoking use included cigarettes. He has a 52.50 pack-year smoking history. He has never used smokeless tobacco. He reports that he does not currently use alcohol. He reports that he does not use  drugs.   Physical Exam: BP (!) 148/70   Pulse (!) 53   Ht 5' 11" (1.803 m)   Wt 257 lb (116.6 kg)   BMI 35.84 kg/m   Constitutional:  Alert and oriented, No acute distress. HEENT: [place] AT, moist mucus membranes.  Trachea midline, no masses. Cardiovascular: No clubbing, cyanosis, or edema. Respiratory: Normal respiratory effort, no increased work of breathing. GU: Moderate right hydrocele, nontender   Assessment & Plan:    1.  Right hydrocele Asymptomatic and he desires observation Follow-up prn   [name] C Stoioff, MD  [facility] [address], [address] , [place] [address] [phone]   

## 2020-12-02 ENCOUNTER — Encounter: Payer: Self-pay | Admitting: Urology

## 2020-12-02 ENCOUNTER — Other Ambulatory Visit: Payer: Self-pay | Admitting: Cardiovascular Disease

## 2020-12-02 ENCOUNTER — Ambulatory Visit: Payer: Medicare Other | Admitting: Urology

## 2020-12-02 ENCOUNTER — Other Ambulatory Visit: Payer: Self-pay

## 2020-12-02 VITALS — BP 148/70 | HR 53 | Ht 71.0 in | Wt 257.0 lb

## 2020-12-02 DIAGNOSIS — N433 Hydrocele, unspecified: Secondary | ICD-10-CM

## 2020-12-03 IMAGING — MR MR CERVICAL SPINE W/O CM
5 series · 33 of 48 positions shown · non-contrast
Comparison: None.

CLINICAL DATA: Two year history of bilateral leg weakness, right
worse than left. Symptoms occur after walking.

EXAM:
MRI CERVICAL SPINE WITHOUT CONTRAST
TECHNIQUE: Multiplanar, multisequence MR imaging of the cervical spine was
performed. No intravenous contrast was administered.

[Series 2: T2 · sagittal · 3.0mm · 0.56mm/px · 7 of 13 slices shown (1 of 2)]
[im 1/13]
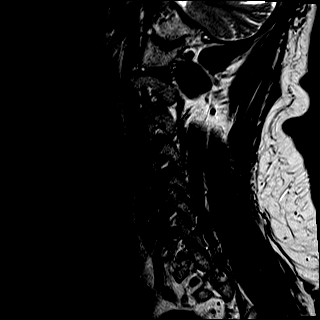
[im 3/13]
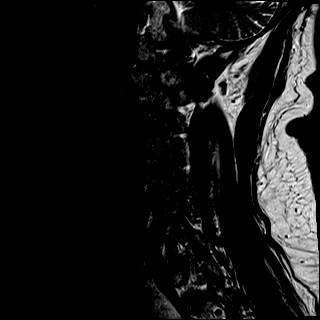
[im 5/13]
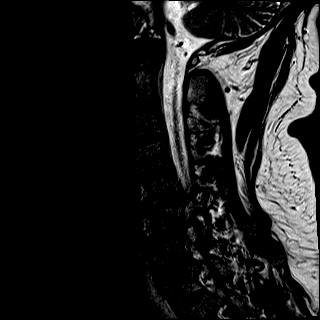
[im 7/13]
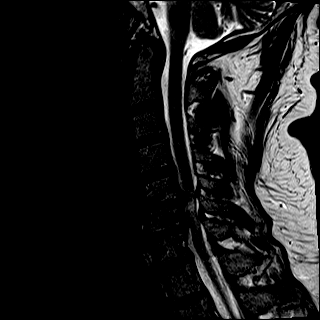
[im 9/13]
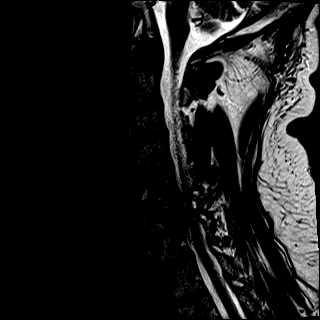
[im 11/13]
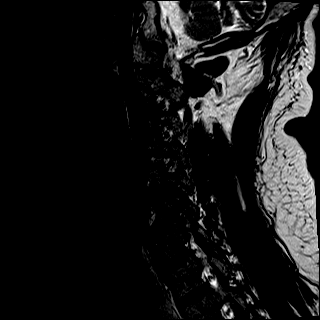
[im 13/13]
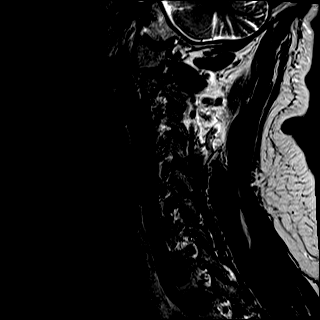

[Series 3: T1 · sagittal · 3.0mm · 0.70mm/px · 7 of 13 slices shown]
[im 1/13]
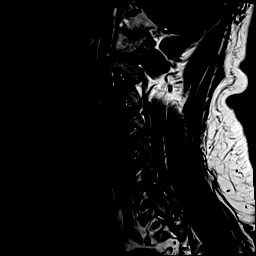
[im 3/13]
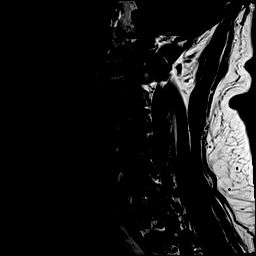
[im 5/13]
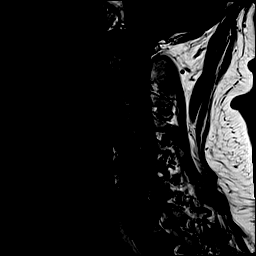
[im 7/13]
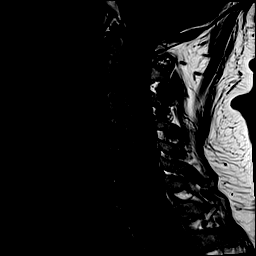
[im 9/13]
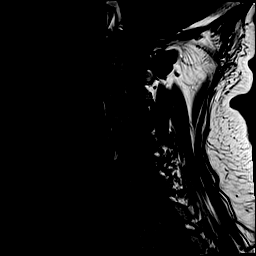
[im 11/13]
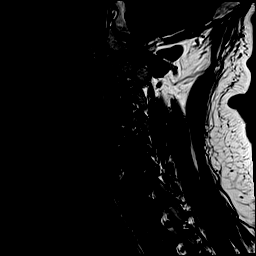
[im 13/13]
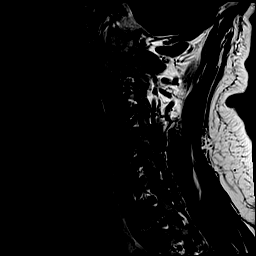

[Series 4: STIR · sagittal · 3.0mm · 0.35mm/px · 6 of 13 slices shown]
[im 1/13]
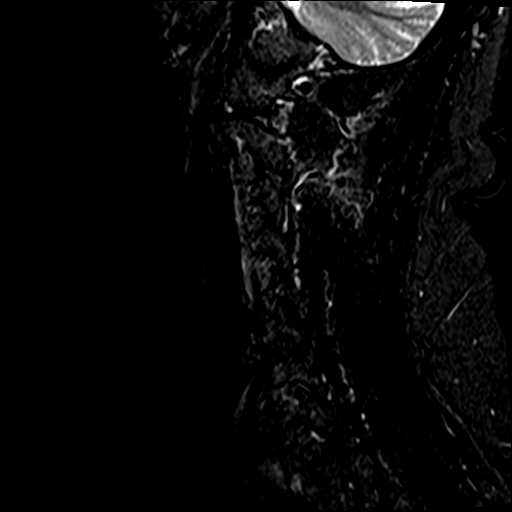
[im 3/13]
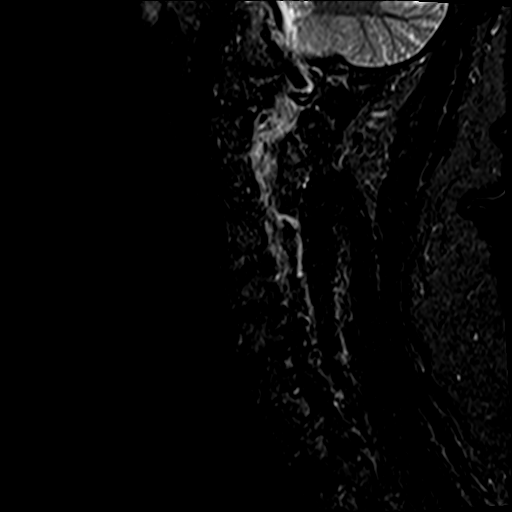
[im 5/13]
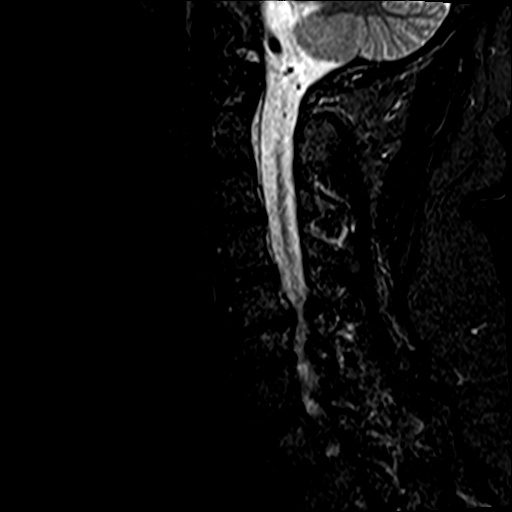
[im 8/13]
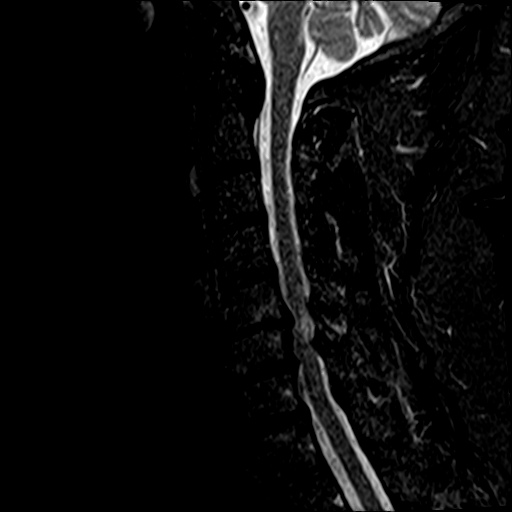
[im 10/13]
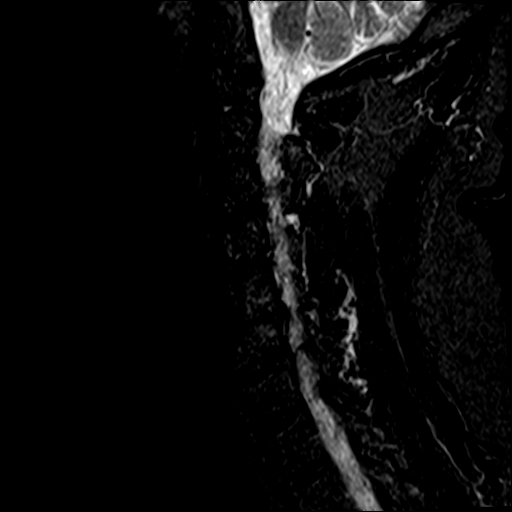
[im 13/13]
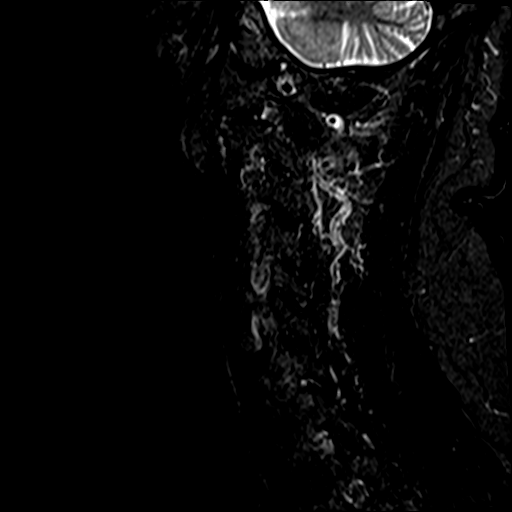

[Series 5: T2 · axial · 3.0mm · 0.62mm/px · z∈[-88,+18]mm · 8 of 29 slices shown (2 of 2)]
[im 1/29]
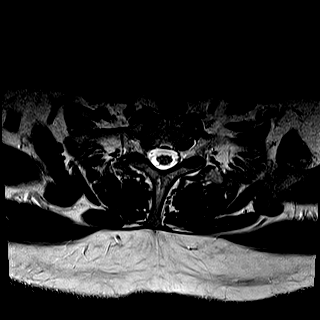
[im 5/29]
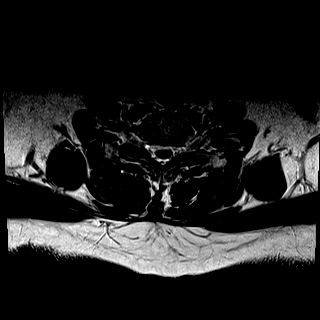
[im 9/29]
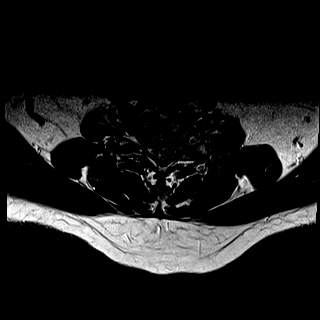
[im 13/29]
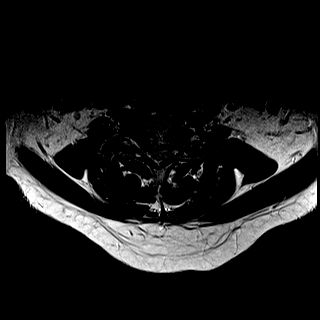
[im 16/29]
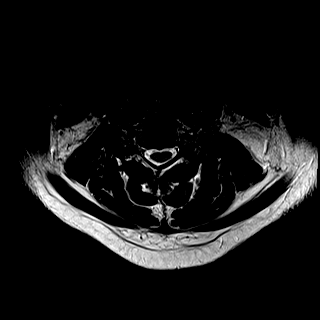
[im 20/29]
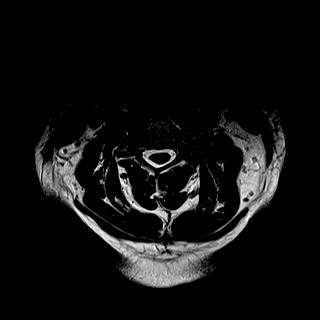
[im 24/29]
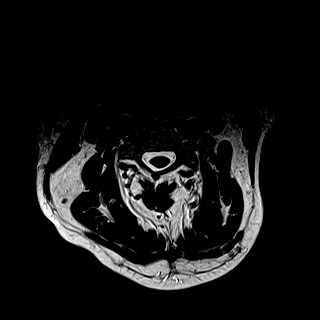
[im 29/29]
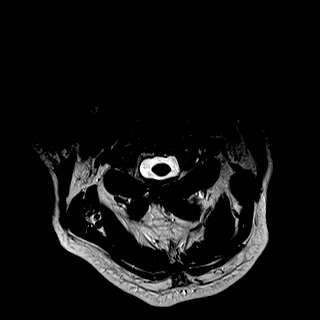

[Series 6: mpgr ax · axial · 3.0mm · 0.35mm/px · z∈[-81,-24]mm · 5 of 29 slices shown]
[im 1/29]
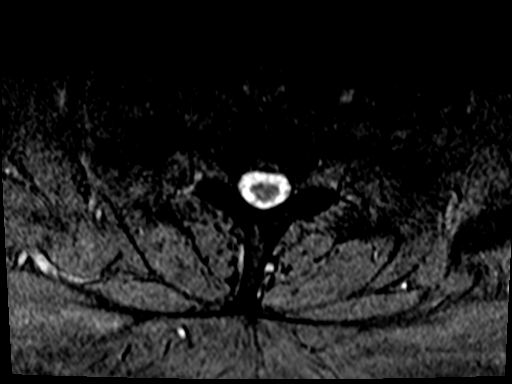
[im 5/29]
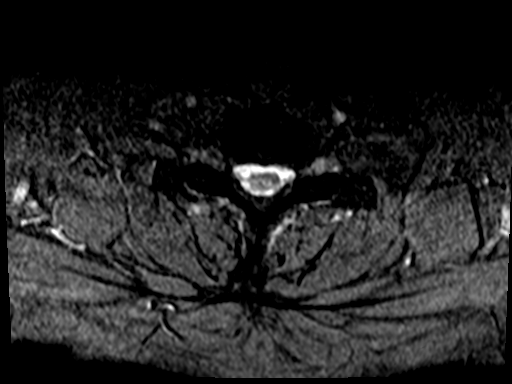
[im 9/29]
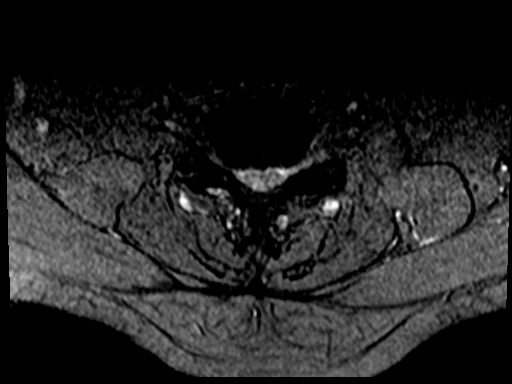
[im 13/29]
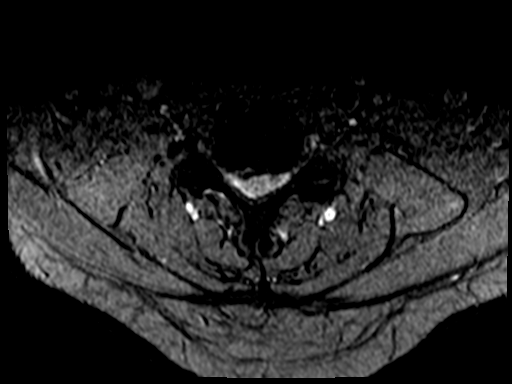
[im 16/29]
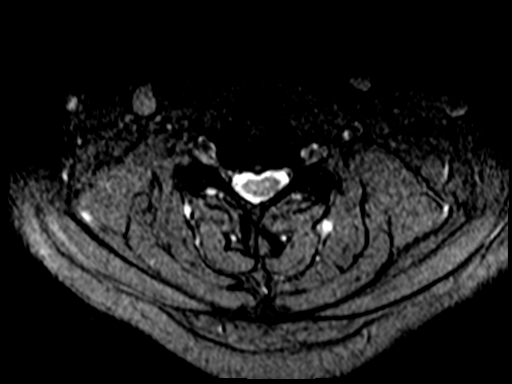

[33 of 48 positions shown; findings below may reference images not displayed]

FINDINGS: Alignment: 2 mm retrolisthesis C5-6.

Vertebrae: No fracture or primary bone lesion.

Cord: Cord flattening at the C5-6 level with abnormal T2 signal in
the cord. See below.

Posterior Fossa, vertebral arteries, paraspinal tissues: Negative

Disc levels:

Foramen magnum is widely patent.  C1-2 and C2-3 are negative.

C3-4: Mild disc bulge and uncovertebral prominence. No compressive
stenosis.

C4-5: Mild disc bulge and uncovertebral prominence. No compressive
stenosis.

C5-6: Endplate osteophytes and broad-based disc herniation more
prominent towards the right. Canal stenosis with AP diameter in the
midline as narrow as 6 mm. Effacement of the subarachnoid space and
flattening of the cord, particularly on the right. Abnormal T2
signal within the cord. Bilateral foraminal stenosis right worse
than left that could affect either C6 nerve.

C6-7: Spondylosis with endplate osteophytes and shallow disc
protrusion. Narrowing of the subarachnoid space. AP diameter of the
canal in the midline as narrow as 7 mm. Slight cord indentation.
Bilateral foraminal stenosis that could affect either C7 nerve.

C7-T1: Disc bulge.  No canal or foraminal stenosis.
IMPRESSION: Severe spinal stenosis at C5-6 with cord flattening and abnormal T2
signal in the cord. Consider spinal decompression. Endplate
osteophytes and broad-based disc herniation more prominent towards
the right. 2 mm retrolisthesis. Bilateral foraminal stenosis in
addition, right worse left.

C6-7: Spinal stenosis, though not as severe as the C5-6 level.
Endplate osteophytes and shallow disc protrusion. Bilateral
foraminal stenosis right worse than left.

These results will be called to the ordering clinician or
representative by the Radiologist Assistant, and communication
documented in the PACS or zVision Dashboard.

## 2020-12-14 ENCOUNTER — Ambulatory Visit: Payer: Medicare Other | Admitting: Cardiovascular Disease

## 2020-12-15 NOTE — Progress Notes (Signed)
Cardiology Office Note  Date:  12/16/2020   ID:  Douglas Hunter, [place] 09-06-1955, MRN [id number]  PCP:  Sofie Hartigan, MD   Chief Complaint  Patient presents with   6 month follow up     Patient c/o LE edema with more in the right leg. Medications reviewed by the patient verbally.      HPI:  [name] is a pleasant 65 -year-old gentleman with  CAD,  non-STEMI with admission to the hospital in late April 2012  cardiac catheterization showing severe mid left circumflex disease, subtotally occluded, with collaterals from the RCA to the LAD circumflex,  normal ejection fraction  sleep apnea but has not had a sleep study.  episodes of atrial flutter. He reports symptoms dating back into his 80s.  hospitalizations for atrial flutter, 08/11/2012 and on 01/11/2013. medication for cardioversion.  several episodes that broke on their own ablation with Dr. Rayann Heman,  for typical atrial flutter, atrial fibrillation. 2014 03/2020: atrial fib, Tikosyn loading,  Who presents today for follow-up of his atrial fibrillation/flutter  LOV 06/2020  In the emergency room June 2022, back pain  Seen by EP, Dr. 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 Rayann Heman,  for typical atrial flutter, atrial fibrillation.   Prior Echocardiogram shows normal LV systolic function, essentially normal study, May 2013   PMH:   has a past medical history of AKI (acute kidney injury) ([place]) (03/13/2020), Atrial flutter ([place]), Coronary artery disease (08/2011), GERD (gastroesophageal reflux disease), History of echocardiogram, Hyperlipidemia, Hypertension, Myocardial infarction [facility]), Paroxysmal atrial fibrillation ([place]), Poorly controlled diabetes mellitus ([place]), RBBB, and Sleep apnea.  PSH:    Past Surgical History:  Procedure Laterality Date   ABLATION  01/29/2013   PVI and CTI by Dr Rayann Heman for atrial flutter and paroxysmal atrial fibrillation   ANTERIOR CERVICAL DECOMP/DISCECTOMY FUSION N/A 04/18/2018   Procedure: ANTERIOR CERVICAL DECOMPRESSION/DISCECTOMY FUSION 2 LEVELS C5-7;  Surgeon: Meade Maw, MD;  Location: [facility] ORS;  Service: Neurosurgery;  Laterality: N/A;   ATRIAL FIBRILLATION ABLATION N/A 01/29/2013   Procedure: ATRIAL FIBRILLATION ABLATION;  Surgeon: Coralyn Mark, MD;  Location: [place] CATH LAB;  Service: Cardiovascular;  Laterality: N/A;   CARDIAC CATHETERIZATION  08/03/2011   COLONOSCOPY WITH PROPOFOL N/A 02/02/2016   Procedure: COLONOSCOPY WITH PROPOFOL;  Surgeon: Lucilla Lame, MD;  Location: [facility] ENDOSCOPY;  Service: Endoscopy;  Laterality: N/A;   LUMBAR LAMINECTOMY/DECOMPRESSION MICRODISCECTOMY N/A 06/07/2017   Procedure: LUMBAR LAMINECTOMY/DECOMPRESSION MICRODISCECTOMY 2 QBHALP-F7-9,K2-4;  Surgeon: Meade Maw, MD;  Location: [facility] ORS;  Service: Neurosurgery;  Laterality: N/A;   RIGHT/LEFT HEART CATH AND CORONARY ANGIOGRAPHY N/A 03/06/2020   Procedure: RIGHT/LEFT HEART CATH  AND CORONARY ANGIOGRAPHY;  Surgeon: Minna Merritts, MD;  Location: [place] CV LAB;  Service: Cardiovascular;  Laterality: N/A;   TEE WITHOUT CARDIOVERSION N/A  01/28/2013   Procedure: TRANSESOPHAGEAL ECHOCARDIOGRAM (TEE);  Surgeon: Lelon Perla, MD;  Location: [facility] ENDOSCOPY;  Service: Cardiovascular;  Laterality: N/A;   Current Outpatient Medications on File Prior to Visit  Medication Sig Dispense Refill   acetaminophen (TYLENOL) 500 MG tablet Take 500 mg by mouth every 6 (six) hours as needed for mild pain or moderate pain.      amLODipine (NORVASC) 5 MG tablet TAKE 1 TABLET BY MOUTH ONCE DAILY 30 tablet 6   atorvastatin (LIPITOR) 80 MG tablet Take 80 mg by mouth at bedtime.     benazepril (LOTENSIN) 40 MG tablet TAKE 1 TABLET(40 MG) BY MOUTH DAILY 90 tablet 0   cholecalciferol (VITAMIN D3) 25 MCG (1000 UNIT) tablet Take 1,000 Units by mouth daily.     Continuous Blood Gluc Sensor (FREESTYLE LIBRE 14 DAY SENSOR) MISC SMARTSIG:1 Kit(s) Topical Every 2 Weeks     dofetilide (TIKOSYN) 500 MCG capsule TAKE 1 CAPSULE BY MOUTH TWICE DAILY 60 capsule 6   ezetimibe (ZETIA) 10 MG tablet TAKE 1 TABLET(10 MG) BY MOUTH DAILY 90 tablet 0   furosemide (LASIX) 20 MG tablet TAKE 1 TABLET(20 MG) BY MOUTH TWICE DAILY AS NEEDED 180 tablet 3   insulin glargine (LANTUS) 100 UNIT/ML injection Inject 65 Units into the skin daily.     insulin lispro (HUMALOG) 100 UNIT/ML KiwkPen Inject 10-16 Units into the skin 3 (three) times daily with meals. Sliding Scale     INSULIN SYRINGE 1CC/29G 29G X 1/2" 1 ML MISC USE AS DIRECTED 100 each PRN   metoprolol succinate (TOPROL-XL) 25 MG 24 hr tablet Take 0.5 tablets (12.5 mg total) by mouth at bedtime. 15 tablet 6   nitroGLYCERIN (NITROSTAT) 0.4 MG SL tablet DISSOLVE 1 TABLET UNDER THE TONGUE EVERY 5 MINUTES AS NEEDED FOR CHEST PAIN 25 tablet 0   ONE TOUCH ULTRA TEST test strip TEST THREE TIMES A DAY AS DIRECTED. 100 each 12   rivaroxaban (XARELTO) 20 MG TABS tablet Take 1 tablet (20 mg total) by mouth daily with supper. 28 tablet 0   sildenafil (REVATIO) 20 MG tablet Take 1 tablet (20 mg total) by mouth 3 (three) times daily as  needed. 90 tablet 3   Current Facility-Administered Medications on File Prior to Visit  Medication Dose Route Frequency Provider Last Rate Last Admin   sodium chloride flush (NS) 0.9 % injection 3 mL  3 mL Intravenous Q12H Visser, Jacquelyn D, PA-C          Allergies:   Patient has no known allergies.   Social History:  The patient  reports that he quit smoking about 12 years ago. His smoking use included cigarettes. He has a 52.50 pack-year smoking history. He has never used smokeless tobacco. He reports that he does not currently use alcohol. He reports that he does not use drugs.   Family History:   family history includes Cancer in his father; Diabetes in his son; Heart attack in his maternal uncle; Heart attack (age of onset: 56) in his father; Heart disease in his mother and paternal grandfather; Stroke in his mother.    Review of Systems: Review of Systems  Constitutional: Negative.   HENT: Negative.    Respiratory: Negative.    Cardiovascular: Negative.   Gastrointestinal: Negative.   Musculoskeletal: Negative.   Neurological: Negative.   Psychiatric/Behavioral:  Negative.    All other systems reviewed and are negative.  PHYSICAL EXAM: VS:  BP 140/60 (BP Location: Left Arm, Patient Position: Sitting, Cuff Size: Normal)   Pulse (!) 56   Ht _0  (1.803 m)   Wt 253 lb 6 oz (114.9 kg)   SpO2 98%   BMI 35.34 kg/m  , BMI Body mass index is 35.34 kg/m. Constitutional:  oriented to person, place, and time. No distress.  HENT:  Head: Grossly normal Eyes:  no discharge. No scleral icterus.  Neck: No JVD, no carotid bruits  Cardiovascular: Regular rate and rhythm, no murmurs appreciated Pulmonary/Chest: Clear to auscultation bilaterally, no wheezes or rails Abdominal: Soft.  no distension.  no tenderness.  Musculoskeletal: Normal range of motion Neurological:  normal muscle tone. Coordination normal. No atrophy Skin: Skin warm and dry Psychiatric: normal affect,  pleasant  Recent Labs: 03/13/2020: ALT 22; Hemoglobin 13.3; Platelets 244; TSH 2.850 09/23/2020: BUN 26; Creatinine, Ser 1.35; Magnesium 2.1; Potassium 4.9; Sodium 136    Lipid Panel Lab Results  Component Value Date   CHOL 162 01/13/2015   HDL 47 01/12/2013   LDLCALC 49 01/12/2013   TRIG 62 01/13/2015      Wt Readings from Last 3 Encounters:  12/16/20 253 lb 6 oz (114.9 kg)  12/02/20 257 lb (116.6 kg)  10/01/20 257 lb 3.2 oz (116.7 kg)     ASSESSMENT AND PLAN:  Paroxysmal atrial fibrillation (HCC)  compliant with his xarelto On tykosin/metoprolol maintaining NSR  Leg weakness Neuropathy, prior back surgery  Coronary artery disease involving native coronary artery of native heart without angina pectoris Currently with no symptoms of angina. No further workup at this time. Continue current medication regimen.  Essential hypertension Blood pressure is well controlled on today's visit. No changes made to the medications.  Mixed hyperlipidemia Cholesterol is at goal on the current lipid regimen. No changes to the medications were made.  Type 2 diabetes mellitus with other circulatory complication, with long-term current use of insulin ([place]) Long history of poorly controlled diabetes Stressed importance of working with endocrinology  Discussed changes to his diet  Total encounter time more than 25 minutes Greater than 50% was spent in counseling and coordination of care with the patient   No orders of the defined types were placed in this encounter.    Signed, Esmond Plants, M.D., Ph.D. 12/16/2020  [place], [place]

## 2020-12-16 ENCOUNTER — Ambulatory Visit (INDEPENDENT_AMBULATORY_CARE_PROVIDER_SITE_OTHER): Payer: Medicare Other | Admitting: Cardiology

## 2020-12-16 ENCOUNTER — Encounter: Payer: Self-pay | Admitting: Cardiovascular Disease

## 2020-12-16 ENCOUNTER — Ambulatory Visit: Payer: Medicare Other | Admitting: Cardiovascular Disease

## 2020-12-16 ENCOUNTER — Other Ambulatory Visit: Payer: Self-pay

## 2020-12-16 VITALS — BP 140/60 | HR 56 | Ht 71.0 in | Wt 253.4 lb

## 2020-12-16 DIAGNOSIS — E0590 Thyrotoxicosis, unspecified without thyrotoxic crisis or storm: Secondary | ICD-10-CM | POA: Diagnosis not present

## 2020-12-16 DIAGNOSIS — G4733 Obstructive sleep apnea (adult) (pediatric): Secondary | ICD-10-CM

## 2020-12-16 DIAGNOSIS — I10 Essential (primary) hypertension: Secondary | ICD-10-CM

## 2020-12-16 DIAGNOSIS — I483 Typical atrial flutter: Secondary | ICD-10-CM

## 2020-12-16 DIAGNOSIS — I25118 Atherosclerotic heart disease of native coronary artery with other forms of angina pectoris: Secondary | ICD-10-CM

## 2020-12-16 DIAGNOSIS — Z79899 Other long term (current) drug therapy: Secondary | ICD-10-CM

## 2020-12-16 DIAGNOSIS — I480 Paroxysmal atrial fibrillation: Secondary | ICD-10-CM

## 2020-12-16 NOTE — Patient Instructions (Signed)
Medication Instructions:  Please continue your current medications  *If you need a refill on your cardiac medications before your next appointment, please call your pharmacy*  Lab Work: None  Testing/Procedures: None   Follow-Up: At [facility], you and your health needs are our priority.  As part of our continuing mission to provide you with exceptional heart care, we have created designated Provider Care Teams.  These Care Teams include your primary Cardiologist (physician) and Advanced Practice Providers (APPs -  Physician Assistants and Nurse Practitioners) who all work together to provide you with the care you need, when you need it.  Your next appointment:   12 month(s)  The format for your next appointment:   In Person  Provider:   You may see [name], MD or one of the following Advanced Practice Providers on your designated Care Team:   Murray Hodgkins, NP Christell Faith, PA-C Marrianne Mood, PA-C Cadence Kensington, [place]

## 2020-12-16 NOTE — Progress Notes (Signed)
Electrophysiology Office Follow up Visit Note:    Date:  12/16/2020   ID:  Douglas Hunter, DOB 03/10/1956, MRN [id number]  PCP:  Sofie Hartigan, MD  [facility] Cardiologist:  [name], MD  [facility] Electrophysiologist:  Vickie Epley, MD    Interval History:    Douglas Hunter is a 65 y.o. male who presents for a follow up visit. They were last seen in clinic 09/23/2020. He is maintained on dofetilide for paroxysmal AF/AFL.  He is with his wife today who I have previously met.  He was just seen by Dr. Rockey Situ.     Past Medical History:  Diagnosis Date   AKI (acute kidney injury) ([place]) 03/13/2020   Atrial flutter ([place])    Coronary artery disease 08/2011   a. NSTEMI 08/2011, LHC w/ severe disease of LCx with good collaterals, lesion was high-risk for intervention due to a steep angulation of the LCx coming from the left main coronary artery as well as heavy calcifications, discused at cath conference w/ consensus advising med Rx; b. 03/2020 Cath: LM nl, LAD 68m LCX 100p w/ R->L and L->L collats. RCA dominant, nl. EF 55%. PA 33/6/19, PCWP 8.   GERD (gastroesophageal reflux disease)    History of echocardiogram    a. 01/2020 Echo: EF 60-65%, no rwma. Sev elev PASP. Mod dil LA.   Hyperlipidemia    Hypertension    Myocardial infarction ([facility]    Paroxysmal atrial fibrillation ([place]    a. s/p ablation 01/2013; b CHADS2VASc => 3 (HTN, DM, vascular disease); c. on Coumadin; d. 12/2019 [place] d/c'd 2/2 hyperthyroidism.   Poorly controlled diabetes mellitus ([place]    RBBB    Sleep apnea     Past Surgical History:  Procedure Laterality Date   ABLATION  01/29/2013   PVI and CTI by Dr ARayann Hemanfor atrial flutter and paroxysmal atrial fibrillation   ANTERIOR CERVICAL DECOMP/DISCECTOMY FUSION N/A 04/18/2018   Procedure: ANTERIOR CERVICAL DECOMPRESSION/DISCECTOMY FUSION 2 LEVELS C5-7;  Surgeon: YMeade Maw MD;  Location: [facility] ORS;  Service: Neurosurgery;   Laterality: N/A;   ATRIAL FIBRILLATION ABLATION N/A 01/29/2013   Procedure: ATRIAL FIBRILLATION ABLATION;  Surgeon: JCoralyn Mark MD;  [facility];  Service: Cardiovascular;  Laterality: N/A;   CARDIAC CATHETERIZATION  08/03/2011   COLONOSCOPY WITH PROPOFOL N/A 02/02/2016   Procedure: COLONOSCOPY WITH PROPOFOL;  Surgeon: DLucilla Lame MD;  Location: [facility] ENDOSCOPY;  Service: Endoscopy;  Laterality: N/A;   LUMBAR LAMINECTOMY/DECOMPRESSION MICRODISCECTOMY N/A 06/07/2017   Procedure: LUMBAR LAMINECTOMY/DECOMPRESSION MICRODISCECTOMY 2 LUTMLYY-T0-3,T4-6  Surgeon: YMeade Maw MD;  Location: [facility] ORS;  Service: Neurosurgery;  Laterality: N/A;   RIGHT/LEFT HEART CATH AND CORONARY ANGIOGRAPHY N/A 03/06/2020   Procedure: RIGHT/LEFT HEART CATH AND CORONARY ANGIOGRAPHY;  Surgeon: GMinna Merritts MD;  [place];  Service: Cardiovascular;  Laterality: N/A;   TEE WITHOUT CARDIOVERSION N/A 01/28/2013   Procedure: TRANSESOPHAGEAL ECHOCARDIOGRAM (TEE);  Surgeon: BLelon Perla MD;  [facility];  Service: Cardiovascular;  Laterality: N/A;    Current Medications: No outpatient medications have been marked as taking for the 12/16/20 encounter (Office Visit) with LVickie Epley MD.     Allergies:   Patient has no known allergies.   Social History   Socioeconomic History   Marital status: Married    Spouse name: Not on file   Number of children: Not on file   Years of education: Not on file   Highest education level: Not on file  Occupational History   Not on file  Tobacco Use   Smoking status: Former    Packs/day: 1.50    Years: 35.00    Pack years: 52.50    Types: Cigarettes    Quit date: 01/16/2008    Years since quitting: 12.9   Smokeless tobacco: Never  Vaping Use   Vaping Use: Never used  Substance and Sexual Activity   Alcohol use: Not Currently    Comment: occasional   Drug use: No   Sexual activity: Yes  Other Topics Concern    Not on file  Social History Narrative   Pt lives in [place] (right outside of [place])   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  ROS:   Please see the history of present illness.    All other systems reviewed and are negative.  EKGs/Labs/Other Studies Reviewed:    The following studies were reviewed today:  September 23, 2020 creatinine 1.35.  Potassium 4.9.  EKG:  The ekg ordered today demonstrates sinus rhythm.  QTC is 457 ms.  There is a right bundle branch block.  Ventricular rate of 56 bpm.  Recent Labs: 03/13/2020: ALT 22; Hemoglobin 13.3; Platelets 244; TSH 2.850 09/23/2020: BUN 26; Creatinine, Ser 1.35; Magnesium 2.1; Potassium 4.9; Sodium 136  Recent Lipid Panel    Component Value Date/Time   CHOL 162 01/13/2015 0854   CHOL 112 01/12/2013 0013   TRIG 62 01/13/2015 0854   TRIG 80 01/12/2013 0013   HDL 47 01/12/2013 0013   VLDL 12 01/13/2015 0854   VLDL 16 01/12/2013 0013   LDLCALC 49 01/12/2013 0013    Physical Exam:    VS: Blood pressure 140/60.  Heart rate 55 bpm.  Respiratory rate 12.  Oxygen sats 98%.  Wt Readings from Last 3 Encounters:  12/16/20 253 lb 6 oz (114.9 kg)  12/02/20 257 lb (116.6 kg)  10/01/20 257 lb 3.2 oz (116.7 kg)     GEN:  Well nourished, well developed in no acute distress HEENT: Normal NECK: No JVD; No carotid bruits LYMPHATICS: No lymphadenopathy CARDIAC: RRR, no murmurs, rubs, gallops RESPIRATORY:  Clear to auscultation  without rales, wheezing or rhonchi  ABDOMEN: Soft, non-tender, non-distended MUSCULOSKELETAL:  No edema; No deformity  SKIN: Warm and dry NEUROLOGIC:  Alert and oriented x 3 PSYCHIATRIC:  Normal affect   ASSESSMENT:    1. Paroxysmal atrial fibrillation (HCC)   2. Typical atrial flutter ([place])   3. High risk medication use   4. Medication management    PLAN:    In order of problems listed above:  1. Paroxysmal atrial fibrillation (HCC) Maintaining sinus rhythm on dofetilide.  Continue current dose.  We will check blood work today to ensure stable creatinine.  2. Typical atrial flutter ([place]) See #1  3. High risk medication use See #1.  Follow-up in 3 months for repeat blood work and repeat EKG.  I will have him see a PA/NP at that visit.  I will plan to see him back at the 41-monthmark.  4. Medication management See #1 and 3.      Medication Adjustments/Labs and Tests Ordered: Current medicines are reviewed at length with the patient today.  Concerns regarding medicines are outlined above.  Orders Placed This Encounter  Procedures   Basic metabolic panel   Magnesium    No orders of the defined types were placed in this encounter.    Signed, CLars Mage MD, [facility] FGastroenterology Care Inc9/14/2022 9:33 AM    Electrophysiology [facility]

## 2020-12-16 NOTE — Patient Instructions (Signed)
Medication Instructions:  Your physician recommends that you continue on your current medications as directed. Please refer to the Current Medication list given to you today.  *If you need a refill on your cardiac medications before your next appointment, please call your pharmacy*   Lab Work: BMET and Mg today If you have labs (blood work) drawn today and your tests are completely normal, you will receive your results only by: [place] (if you have MyChart) OR A paper copy in the mail If you have any lab test that is abnormal or we need to change your treatment, we will call you to review the results.   Testing/Procedures: None ordered.    Follow-Up: At [facility], you and your health needs are our priority.  As part of our continuing mission to provide you with exceptional heart care, we have created designated Provider Care Teams.  These Care Teams include your primary Cardiologist (physician) and Advanced Practice Providers (APPs -  Physician Assistants and Nurse Practitioners) who all work together to provide you with the care you need, when you need it.  We recommend signing up for the patient portal called "MyChart".  Sign up information is provided on this After Visit Summary.  MyChart is used to connect with patients for Virtual Visits (Telemedicine).  Patients are able to view lab/test results, encounter notes, upcoming appointments, etc.  Non-urgent messages can be sent to your provider as well.   To learn more about what you can do with MyChart, go to [email or web address].    Your next appointment:   3 months with Dr Quentin Ore or an APP

## 2020-12-17 LAB — BASIC METABOLIC PANEL
BUN/Creatinine Ratio: 25 — ABNORMAL HIGH (ref 10–24)
BUN: 32 mg/dL — ABNORMAL HIGH (ref 8–27)
CO2: 19 mmol/L — ABNORMAL LOW (ref 20–29)
Calcium: 9.3 mg/dL (ref 8.6–10.2)
Chloride: 104 mmol/L (ref 96–106)
Creatinine, Ser: 1.29 mg/dL — ABNORMAL HIGH (ref 0.76–1.27)
Glucose: 254 mg/dL — ABNORMAL HIGH (ref 65–99)
Potassium: 5.3 mmol/L — ABNORMAL HIGH (ref 3.5–5.2)
Sodium: 137 mmol/L (ref 134–144)
eGFR: 61 mL/min/{1.73_m2} (ref >59)

## 2020-12-17 LAB — MAGNESIUM: Magnesium: 2 mg/dL (ref 1.6–2.3)

## 2020-12-17 LAB — SPECIMEN STATUS REPORT

## 2020-12-28 ENCOUNTER — Ambulatory Visit: Payer: Medicare Other | Admitting: Cardiovascular Disease

## 2021-01-24 ENCOUNTER — Other Ambulatory Visit: Payer: Self-pay | Admitting: Cardiovascular Disease

## 2021-01-28 ENCOUNTER — Other Ambulatory Visit: Payer: Self-pay | Admitting: Cardiovascular Disease

## 2021-02-01 ENCOUNTER — Telehealth: Payer: Self-pay | Admitting: Pulmonary Disease

## 2021-02-02 NOTE — Telephone Encounter (Signed)
Lm for patient.  

## 2021-02-03 ENCOUNTER — Telehealth: Payer: Self-pay | Admitting: Pulmonary Disease

## 2021-02-03 DIAGNOSIS — G4733 Obstructive sleep apnea (adult) (pediatric): Secondary | ICD-10-CM

## 2021-02-03 NOTE — Telephone Encounter (Signed)
Lm for patient.  

## 2021-02-03 NOTE — Telephone Encounter (Signed)
Lm x2 for patient.  Will close encounter per office protocol.   

## 2021-02-04 NOTE — Telephone Encounter (Signed)
Order placed to [facility] for cpap supplies.  Patient is aware and voiced his understanding.  Nothing further needed.

## 2021-03-08 ENCOUNTER — Other Ambulatory Visit: Payer: Self-pay | Admitting: Cardiology

## 2021-03-08 ENCOUNTER — Other Ambulatory Visit: Payer: Self-pay

## 2021-03-08 MED ORDER — EZETIMIBE 10 MG PO TABS: ORAL_TABLET | ORAL | 2 refills | Status: DC

## 2021-03-08 NOTE — Telephone Encounter (Signed)
*  STAT* If patient is at the pharmacy, call can be transferred to refill team.   1. Which medications need to be refilled? (please list name of each medication and dose if known) Zetia  2. Which pharmacy/location (including street and city if local pharmacy) is medication to be sent to? [facility]  3. Do they need a 30 day or 90 day supply? [place]

## 2021-03-09 NOTE — Telephone Encounter (Signed)
This is a [place] pt 

## 2021-03-10 ENCOUNTER — Other Ambulatory Visit: Payer: Self-pay | Admitting: Cardiovascular Disease

## 2021-03-17 ENCOUNTER — Ambulatory Visit: Payer: Medicare Other | Admitting: Cardiology

## 2021-04-27 NOTE — Progress Notes (Signed)
Electrophysiology Office Follow up Visit Note:    Date:  04/28/2021   ID:  Douglas Hunter, DOB 19-Mar-1956, MRN [id number]  PCP:  Sofie Hartigan, MD  [facility] Cardiologist:  [name], MD  [facility] Electrophysiologist:  Vickie Epley, MD    Interval History:    Douglas Hunter is a 66 y.o. male who presents for a follow up visit. They were last seen in clinic 12/16/2020. He is maintained on dofetilide for his history of symptomatic atrial fibrillation. He continues to have episodes of atrial fibrillation that are occurring at least weekly.  His wife mentions that they may be occurring on a daily basis.  He continues to take dofetilide 500 mcg by mouth twice daily and Xarelto for stroke prophylaxis.       Past Medical History:  Diagnosis Date   AKI (acute kidney injury) ([place]) 03/13/2020   Atrial flutter ([place])    Coronary artery disease 08/2011   a. NSTEMI 08/2011, LHC w/ severe disease of LCx with good collaterals, lesion was high-risk for intervention due to a steep angulation of the LCx coming from the left main coronary artery as well as heavy calcifications, discused at cath conference w/ consensus advising med Rx; b. 03/2020 Cath: LM nl, LAD 79m LCX 100p w/ R->L and L->L collats. RCA dominant, nl. EF 55%. PA 33/6/19, PCWP 8.   GERD (gastroesophageal reflux disease)    History of echocardiogram    a. 01/2020 Echo: EF 60-65%, no rwma. Sev elev PASP. Mod dil LA.   Hyperlipidemia    Hypertension    Myocardial infarction ([facility]    Paroxysmal atrial fibrillation ([facility]    a. s/p ablation 01/2013; b CHADS2VASc => 3 (HTN, DM, vascular disease); c. on Coumadin; d. 12/2019 Amio d/c'd 2/2 hyperthyroidism.   Poorly controlled diabetes mellitus ([place]    RBBB    Sleep apnea     Past Surgical History:  Procedure Laterality Date   ABLATION  01/29/2013   PVI and CTI by Dr ARayann Hemanfor atrial flutter and paroxysmal atrial fibrillation   ANTERIOR CERVICAL  DECOMP/DISCECTOMY FUSION N/A 04/18/2018   Procedure: ANTERIOR CERVICAL DECOMPRESSION/DISCECTOMY FUSION 2 LEVELS C5-7;  Surgeon: YMeade Maw MD;  Location: [facility] ORS;  Service: Neurosurgery;  Laterality: N/A;   ATRIAL FIBRILLATION ABLATION N/A 01/29/2013   Procedure: ATRIAL FIBRILLATION ABLATION;  Surgeon: JCoralyn Mark MD;  [facility];  Service: Cardiovascular;  Laterality: N/A;   CARDIAC CATHETERIZATION  08/03/2011   COLONOSCOPY WITH PROPOFOL N/A 02/02/2016   Procedure: COLONOSCOPY WITH PROPOFOL;  Surgeon: DLucilla Lame MD;  Location: [facility] ENDOSCOPY;  Service: Endoscopy;  Laterality: N/A;   LUMBAR LAMINECTOMY/DECOMPRESSION MICRODISCECTOMY N/A 06/07/2017   Procedure: LUMBAR LAMINECTOMY/DECOMPRESSION MICRODISCECTOMY 2 [id number]  Surgeon: YMeade Maw MD;  Location: [facility] ORS;  Service: Neurosurgery;  Laterality: N/A;   RIGHT/LEFT HEART CATH AND CORONARY ANGIOGRAPHY N/A 03/06/2020   Procedure: RIGHT/LEFT HEART CATH AND CORONARY ANGIOGRAPHY;  Surgeon: GMinna Merritts MD;  [facility];  Service: Cardiovascular;  Laterality: N/A;   TEE WITHOUT CARDIOVERSION N/A 01/28/2013   Procedure: TRANSESOPHAGEAL ECHOCARDIOGRAM (TEE);  Surgeon: BLelon Perla MD;  [facility];  Service: Cardiovascular;  Laterality: N/A;    Current Medications: Current Meds  Medication Sig   acetaminophen (TYLENOL) 500 MG tablet Take 500 mg by mouth every 6 (six) hours as needed for mild pain or moderate pain.    amLODipine (NORVASC) 5 MG tablet TAKE 1 TABLET BY MOUTH ONCE DAILY  atorvastatin (LIPITOR) 80 MG tablet Take 80 mg by mouth at bedtime.   benazepril (LOTENSIN) 40 MG tablet TAKE 1 TABLET(40 MG) BY MOUTH DAILY   cholecalciferol (VITAMIN D3) 25 MCG (1000 UNIT) tablet Take 1,000 Units by mouth daily.   Continuous Blood Gluc Sensor (FREESTYLE LIBRE 14 DAY SENSOR) MISC SMARTSIG:1 Kit(s) Topical Every 2 Weeks   dofetilide (TIKOSYN) 500 MCG capsule TAKE 1 CAPSULE  BY MOUTH TWICE DAILY   ezetimibe (ZETIA) 10 MG tablet TAKE 1 TABLET(10 MG) BY MOUTH DAILY   furosemide (LASIX) 20 MG tablet TAKE 1 TABLET BY MOUTH TWICE DAILY AS NEEDED   insulin glargine (LANTUS) 100 UNIT/ML injection Inject 65 Units into the skin daily.   insulin lispro (HUMALOG) 100 UNIT/ML KiwkPen Inject 10-16 Units into the skin 3 (three) times daily with meals. Sliding Scale   INSULIN SYRINGE 1CC/29G 29G X 1/2" 1 ML MISC USE AS DIRECTED   nitroGLYCERIN (NITROSTAT) 0.4 MG SL tablet DISSOLVE 1 TABLET UNDER THE TONGUE EVERY 5 MINUTES AS NEEDED FOR CHEST PAIN   ONE TOUCH ULTRA TEST test strip TEST THREE TIMES A DAY AS DIRECTED.   rivaroxaban (XARELTO) 20 MG TABS tablet Take 1 tablet (20 mg total) by mouth daily with supper.   sildenafil (REVATIO) 20 MG tablet Take 1 tablet (20 mg total) by mouth 3 (three) times daily as needed.   [DISCONTINUED] metoprolol succinate (TOPROL XL) 25 MG 24 hr tablet Take 1 tablet (25 mg total) by mouth daily.   [DISCONTINUED] metoprolol succinate (TOPROL-XL) 25 MG 24 hr tablet TAKE 1/2 TABLET BY MOUTH AT BEDTIME     Allergies:   Patient has no known allergies.   Social History   Socioeconomic History   Marital status: Married    Spouse name: Not on file   Number of children: Not on file   Years of education: Not on file   Highest education level: Not on file  Occupational History   Not on file  Tobacco Use   Smoking status: Former    Packs/day: 1.50    Years: 35.00    Pack years: 52.50    Types: Cigarettes    Quit date: 01/16/2008    Years since quitting: 13.2   Smokeless tobacco: Never  Vaping Use   Vaping Use: Never used  Substance and Sexual Activity   Alcohol use: Not Currently    Comment: occasional   Drug use: No   Sexual activity: Yes  Other Topics Concern   Not on file  Social History Narrative   Pt lives in [place] (right outside of [place])   Works for a [employer] and exercise psychologist in [place] Strain: Not on [employer] Insecurity: Not on file  Transportation Needs: Not on file  Physical Activity: Not on file  Stress: Not on file  Social Connections: Not on file     Family History: The patient's family history includes Cancer in his father; Diabetes in his son; Heart attack in his maternal uncle; Heart attack (age of onset: 35) in his father; Heart disease in his mother and paternal grandfather; Stroke in his mother.  ROS:   Please see the history of present illness.    All other systems reviewed and are negative.  EKGs/Labs/Other Studies Reviewed:    The following studies were reviewed today:  EKG:  The ekg ordered today demonstrates Sinus rhythm.  Right bundle branch block.  QTC is 461 ms.  Recent Labs: 12/16/2020: BUN 32; Creatinine, Ser 1.29; Magnesium 2.0; Potassium 5.3; Sodium 137  Recent Lipid Panel    Component Value Date/Time   CHOL 162 01/13/2015 0854   CHOL 112 01/12/2013 0013   TRIG 62 01/13/2015 0854   TRIG 80 01/12/2013 0013   HDL 47 01/12/2013 0013   VLDL 12 01/13/2015 0854   VLDL 16 01/12/2013 0013   LDLCALC 49 01/12/2013 0013    Physical Exam:    VS:  BP 130/80 (BP Location: Left Arm, Patient Position: Sitting, Cuff Size: Normal)    Pulse (!) 53    Ht 5' 11"  (1.803 m)    Wt 261 lb (118.4 kg)    SpO2 96%    BMI 36.40 kg/m     Wt Readings from Last 3 Encounters:  04/28/21 261 lb (118.4 kg)  12/16/20 253 lb 6 oz (114.9 kg)  12/02/20 257 lb (116.6 kg)     GEN:  Well nourished, well developed in no acute distress HEENT: Normal NECK: No JVD; No carotid bruits LYMPHATICS: No lymphadenopathy CARDIAC: RRR, no murmurs, rubs, gallops RESPIRATORY:  Clear to auscultation without rales, wheezing or rhonchi  ABDOMEN: Soft, non-tender, non-distended MUSCULOSKELETAL:  No edema; No deformity  SKIN: Warm and dry NEUROLOGIC:  Alert and oriented x 3 PSYCHIATRIC:  Normal affect        ASSESSMENT:    1.  Paroxysmal atrial fibrillation (HCC)   2. Typical atrial flutter ([place])   3. Primary hypertension   4. Encounter for long-term current use of high risk medication    PLAN:    In order of problems listed above:   #Persistent atrial fibrillation Largely maintaining sinus rhythm on Tikosyn but is having frequent breakthrough episodes of atrial fibrillation.  On Xarelto for stroke prophylaxis.   We discussed continuing Tikosyn versus pursuing catheter ablation.  He would like to monitor the arrhythmia for another 3 months before finalizing a decision about catheter ablation.  If he continues to have breakthrough episodes of atrial fibrillation we will pursue catheter ablation.  I discussed the ablation procedure in detail during today's visit.  I will see him back in 3 months.  #Hypertension At goal.  Continue current medical therapy.  #High risk medication use QTC acceptable today. Magnesium and BMP today.   Follow-up 3 months       Total time spent with patient today 44 minutes. This includes reviewing records, evaluating the patient and coordinating care.   Medication Adjustments/Labs and Tests Ordered: Current medicines are reviewed at length with the patient today.  Concerns regarding medicines are outlined above.  Orders Placed This Encounter  Procedures   Magnesium   Basic Metabolic Panel (BMET)   EKG 12-Lead   Meds ordered this encounter  Medications   DISCONTD: metoprolol succinate (TOPROL XL) 25 MG 24 hr tablet    Sig: Take 1 tablet (25 mg total) by mouth daily.    Dispense:  90 tablet    Refill:  3     Signed, Lars Mage, MD, [facility], [facility] 04/28/2021 9:34 PM    Electrophysiology [facility]

## 2021-04-28 ENCOUNTER — Ambulatory Visit: Payer: Medicare Other | Admitting: Cardiology

## 2021-04-28 ENCOUNTER — Telehealth: Payer: Self-pay | Admitting: Pulmonary Disease

## 2021-04-28 ENCOUNTER — Encounter: Payer: Self-pay | Admitting: Cardiology

## 2021-04-28 ENCOUNTER — Telehealth: Payer: Self-pay | Admitting: Cardiology

## 2021-04-28 ENCOUNTER — Other Ambulatory Visit: Payer: Self-pay

## 2021-04-28 VITALS — BP 130/80 | HR 53 | Ht 71.0 in | Wt 261.0 lb

## 2021-04-28 DIAGNOSIS — G4733 Obstructive sleep apnea (adult) (pediatric): Secondary | ICD-10-CM

## 2021-04-28 DIAGNOSIS — Z79899 Other long term (current) drug therapy: Secondary | ICD-10-CM | POA: Diagnosis not present

## 2021-04-28 DIAGNOSIS — I480 Paroxysmal atrial fibrillation: Secondary | ICD-10-CM | POA: Diagnosis not present

## 2021-04-28 DIAGNOSIS — I10 Essential (primary) hypertension: Secondary | ICD-10-CM

## 2021-04-28 DIAGNOSIS — I483 Typical atrial flutter: Secondary | ICD-10-CM

## 2021-04-28 MED ORDER — METOPROLOL SUCCINATE ER 25 MG PO TB24: 25.0000 mg | ORAL_TABLET | Freq: Every day | ORAL | 3 refills | Status: DC

## 2021-04-28 NOTE — Telephone Encounter (Signed)
°*  STAT* If patient is at the pharmacy, call can be transferred to refill team.   1. Which medications need to be refilled? (please list name of each medication and dose if known) metoprolol succinate 25 MG 1 tablet daily   2. Which pharmacy/location (including street and city if local pharmacy) is medication to be sent to? [facility] - please change to preferred pharmacy   3. Do they need a 30 day or 90 day supply? 90 day

## 2021-04-28 NOTE — Telephone Encounter (Signed)
Okay to send order for new auto CPAP 5 to 15 cm H2O with heated humidity.  Okay to change DME to [facility].

## 2021-04-28 NOTE — Telephone Encounter (Signed)
Order placed to [facility] for cpap machine and supplies. Patient is aware and voiced his understanding.  Nothing further needed at this time.

## 2021-04-28 NOTE — Telephone Encounter (Signed)
Lm for patient.  

## 2021-04-28 NOTE — Telephone Encounter (Signed)
Spoke to patient, who is requesting a order for new cpap machine. He would like to switch [facility]. According to our records, current settings are 5-15cm.  Dr. Halford Chessman, please advise if okay to order new machine? Thanks

## 2021-04-28 NOTE — Telephone Encounter (Signed)
metoprolol succinate (TOPROL XL) 25 MG 24 hr tablet 90 tablet 3 04/28/2021    Sig - Route: Take 1 tablet (25 mg total) by mouth daily. - Oral   Sent to pharmacy as: metoprolol succinate (TOPROL XL) 25 MG 24 hr tablet   E-Prescribing Status: Receipt confirmed by pharmacy (04/28/2021  1:57 PM EST)    Pharmacy  [place], [place].

## 2021-04-28 NOTE — Patient Instructions (Addendum)
Medications: Increase Metoprolol succinate to 25 mg daily  Your physician recommends that you continue on your current medications as directed. Please refer to the Current Medication list given to you today. *If you need a refill on your cardiac medications before your next appointment, please call your pharmacy*  Lab Work: Mag, [facility] If you have labs (blood work) drawn today and your tests are completely normal, you will receive your results only by: [place] (if you have MyChart) OR A paper copy in the mail If you have any lab test that is abnormal or we need to change your treatment, we will call you to review the results.  Testing/Procedures: None.  Follow-Up: At [facility], you and your health needs are our priority.  As part of our continuing mission to provide you with exceptional heart care, we have created designated Provider Care Teams.  These Care Teams include your primary Cardiologist (physician) and Advanced Practice Providers (APPs -  Physician Assistants and Nurse Practitioners) who all work together to provide you with the care you need, when you need it.  Your physician wants you to follow-up in: 3 months with Lars Mage     You will receive a reminder letter in the mail two months in advance. If you don't receive a letter, please call our office to schedule the follow-up appointment.  We recommend signing up for the patient portal called "MyChart".  Sign up information is provided on this After Visit Summary.  MyChart is used to connect with patients for Virtual Visits (Telemedicine).  Patients are able to view lab/test results, encounter notes, upcoming appointments, etc.  Non-urgent messages can be sent to your provider as well.   To learn more about what you can do with MyChart, go to [email or web address].    Any Other Special Instructions Will Be Listed Below (If Applicable).

## 2021-04-29 LAB — BASIC METABOLIC PANEL
BUN/Creatinine Ratio: 21 (ref 10–24)
BUN: 26 mg/dL (ref 8–27)
CO2: 20 mmol/L (ref 20–29)
Calcium: 9.1 mg/dL (ref 8.6–10.2)
Chloride: 105 mmol/L (ref 96–106)
Creatinine, Ser: 1.22 mg/dL (ref 0.76–1.27)
Glucose: 146 mg/dL — ABNORMAL HIGH (ref 70–99)
Potassium: 4.8 mmol/L (ref 3.5–5.2)
Sodium: 138 mmol/L (ref 134–144)
eGFR: 65 mL/min/{1.73_m2} (ref >59)

## 2021-04-29 LAB — MAGNESIUM: Magnesium: 2 mg/dL (ref 1.6–2.3)

## 2021-06-07 ENCOUNTER — Other Ambulatory Visit: Payer: Self-pay | Admitting: Cardiology

## 2021-07-06 ENCOUNTER — Other Ambulatory Visit: Payer: Self-pay | Admitting: Cardiology

## 2021-07-13 NOTE — Progress Notes (Signed)
?Electrophysiology Office Follow up Visit Note:   ? ?Date:  07/14/2021  ? ?ID:  Douglas Hunter, DOB 12/26/1955, MRN [id number] ? ?PCP:  Feldpausch, Dale E, MD  ?[facility] Cardiologist:  Timothy Gollan, MD  ?[facility] Electrophysiologist:  CAMERON T LAMBERT, MD  ? ? ?Interval History:   ? ?Douglas Hunter is a 66 y.o. male who presents for a follow up visit. They were last seen in clinic 04/28/2021.  He is on dofetilide for his persistent atrial fibrillation.  At that appointment he was still having breakthrough episodes of atrial fibrillation and we discussed using catheter ablation to augment his rhythm control.  He presents today to discuss the status of his atrial fibrillation and whether or not to proceed with catheter ablation. ? ?The patient is with his wife today in clinic.  He reports continued episodes of atrial fibrillation's that are symptomatic with elevated ventricular rates.  They occur at least 1 to 2 times per week. ? ?  ? ?Past Medical History:  ?Diagnosis Date  ? AKI (acute kidney injury) ([facility]) 03/13/2020  ? Atrial flutter ([facility])   ? Coronary artery disease 08/2011  ? a. NSTEMI 08/2011, LHC w/ severe disease of LCx with good collaterals, lesion was high-risk for intervention due to a steep angulation of the LCx coming from the left main coronary artery as well as heavy calcifications, discused at cath conference w/ consensus advising med Rx; b. 03/2020 Cath: LM nl, LAD 50m, LCX 100p w/ R->L and L->L collats. RCA dominant, nl. EF 55%. PA 33/6/19, PCWP 8.  ? GERD (gastroesophageal reflux disease)   ? History of echocardiogram   ? a. 01/2020 Echo: EF 60-65%, no rwma. Sev elev PASP. Mod dil LA.  ? Hyperlipidemia   ? Hypertension   ? Myocardial infarction ([facility])   ? Paroxysmal atrial fibrillation ([facility])   ? a. s/p ablation 01/2013; b CHADS2VASc => 3 (HTN, DM, vascular disease); c. on Coumadin; d. 12/2019 Amio d/c'd 2/2 hyperthyroidism.  ? Poorly controlled diabetes mellitus (H[facility])   ? RBBB   ? Sleep  apnea   ? ? ?Past Surgical History:  ?Procedure Laterality Date  ? ABLATION  01/29/2013  ? PVI and CTI by Dr Allred for atrial flutter and paroxysmal atrial fibrillation  ? ANTERIOR CERVICAL DECOMP/DISCECTOMY FUSION N/A 04/18/2018  ? Procedure: ANTERIOR CERVICAL DECOMPRESSION/DISCECTOMY FUSION 2 LEVELS C5-7;  Surgeon: Yarbrough, Chester, MD;  Location: [facility] ORS;  Service: Neurosurgery;  Laterality: N/A;  ? ATRIAL FIBRILLATION ABLATION N/A 01/29/2013  ? Procedure: ATRIAL FIBRILLATION ABLATION;  Surgeon: James D Allred, MD;  [facility];  Service: Cardiovascular;  Laterality: N/A;  ? CARDIAC CATHETERIZATION  08/03/2011  ? COLONOSCOPY WITH PROPOFOL N/A 02/02/2016  ? Procedure: COLONOSCOPY WITH PROPOFOL;  Surgeon: Darren Wohl, MD;  Location: [facility] ENDOSCOPY;  Service: Endoscopy;  Laterality: N/A;  ? LUMBAR LAMINECTOMY/DECOMPRESSION MICRODISCECTOMY N/A 06/07/2017  ? Procedure: LUMBAR LAMINECTOMY/DECOMPRESSION MICRODISCECTOMY 2 LEVELS-L3-4,L4-5;  Surgeon: Yarbrough, Chester, MD;  Location: [facility] ORS;  Service: Neurosurgery;  Laterality: N/A;  ? RIGHT/LEFT HEART CATH AND CORONARY ANGIOGRAPHY N/A 03/06/2020  ? Procedure: RIGHT/LEFT HEART CATH AND CORONARY ANGIOGRAPHY;  Surgeon: Gollan, Timothy J, MD;  Location: [facility] CV LAB;  Service: Cardiovascular;  Laterality: N/A;  ? TEE WITHOUT CARDIOVERSION N/A 01/28/2013  ? Procedure: TRANSESOPHAGEAL ECHOCARDIOGRAM (TEE);  Surgeon: Brian S Crenshaw, MD;  [facility];  Service: Cardiovascular;  Laterality: N/A;  ? ? ?Current Medications: ?Current Meds  ?Medication Sig  ? acetaminophen (TYLENOL) 500 MG tablet Take   500 mg by mouth every 6 (six) hours as needed for mild pain or moderate pain.   ? amLODipine (NORVASC) 5 MG tablet TAKE 1 TABLET BY MOUTH ONCE DAILY  ? atorvastatin (LIPITOR) 80 MG tablet Take 80 mg by mouth at bedtime.  ? benazepril (LOTENSIN) 40 MG tablet TAKE 1 TABLET(40 MG) BY MOUTH DAILY  ? cholecalciferol (VITAMIN D3) 25 MCG (1000 UNIT) tablet Take  1,000 Units by mouth daily.  ? Continuous Blood Gluc Sensor (FREESTYLE LIBRE 14 DAY SENSOR) MISC SMARTSIG:1 Kit(s) Topical Every 2 Weeks  ? dofetilide (TIKOSYN) 500 MCG capsule TAKE 1 CAPSULE BY MOUTH TWICE DAILY  ? ezetimibe (ZETIA) 10 MG tablet TAKE 1 TABLET(10 MG) BY MOUTH DAILY  ? furosemide (LASIX) 20 MG tablet TAKE 1 TABLET BY MOUTH TWICE DAILY AS NEEDED  ? insulin glargine (LANTUS) 100 UNIT/ML injection Inject 65 Units into the skin daily.  ? insulin lispro (HUMALOG) 100 UNIT/ML KiwkPen Inject 10-16 Units into the skin 3 (three) times daily with meals. Sliding Scale  ? INSULIN SYRINGE 1CC/29G 29G X 1/2" 1 ML MISC USE AS DIRECTED  ? metoprolol succinate (TOPROL XL) 25 MG 24 hr tablet Take 1 tablet (25 mg total) by mouth daily.  ? nitroGLYCERIN (NITROSTAT) 0.4 MG SL tablet DISSOLVE 1 TABLET UNDER THE TONGUE EVERY 5 MINUTES AS NEEDED FOR CHEST PAIN  ? ONE TOUCH ULTRA TEST test strip TEST THREE TIMES A DAY AS DIRECTED.  ? rivaroxaban (XARELTO) 20 MG TABS tablet Take 1 tablet (20 mg total) by mouth daily with supper.  ? sildenafil (REVATIO) 20 MG tablet Take 1 tablet (20 mg total) by mouth 3 (three) times daily as needed.  ?  ? ?Allergies:   Patient has no known allergies.  ? ?Social History  ? ?Socioeconomic History  ? Marital status: Married  ?  Spouse name: Not on file  ? Number of children: Not on file  ? Years of education: Not on file  ? Highest education level: Not on file  ?Occupational History  ? Not on file  ?Tobacco Use  ? Smoking status: Former  ?  Packs/day: 1.50  ?  Years: 35.00  ?  Pack years: 52.50  ?  Types: Cigarettes  ?  Quit date: 01/16/2008  ?  Years since quitting: 13.5  ? Smokeless tobacco: Never  ?Vaping Use  ? Vaping Use: Never used  ?Substance and Sexual Activity  ? Alcohol use: Not Currently  ?  Comment: occasional  ? Drug use: No  ? Sexual activity: Yes  ?Other Topics Concern  ? Not on file  ?Social History Narrative  ? Pt lives in [place] (right outside of [place])  ? 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 ?Social Determinants of Health  ? ?Financial Resource Strain: Not on file  ?Food Insecurity: Not on file  ?Transportation Needs: Not on file  ?Physical Activity: Not on file  ?Stress: Not on file  ?Social Connections: Not on file  ?  ? ?Family History: ?The patient's family history includes Cancer in his father; Diabetes in his son; Heart attack in his maternal uncle; Heart attack (age of onset: 81) in his father; Heart disease in his mother and paternal grandfather; Stroke in his mother. ? ?ROS:   ?Please see the history of present illness.    ?All other systems reviewed and are negative. ? ?EKGs/Labs/Other Studies Reviewed:   ? ?The following studies were reviewed today: ? ? ?EKG:  The ekg ordered   today demonstrates sinus rhythm.  Right bundle branch block.  QTc 459 ms. ? ?Recent Labs: ?04/28/2021: BUN 26; Creatinine, Ser 1.22; Magnesium 2.0; Potassium 4.8; Sodium 138  ?Recent Lipid Panel ?   ?Component Value Date/Time  ? CHOL 162 01/13/2015 0854  ? CHOL 112 01/12/2013 0013  ? TRIG 62 01/13/2015 0854  ? TRIG 80 01/12/2013 0013  ? HDL 47 01/12/2013 0013  ? VLDL 12 01/13/2015 0854  ? VLDL 16 01/12/2013 0013  ? LDLCALC 49 01/12/2013 0013  ? ? ?Physical Exam:   ? ?VS:  BP (!) 144/74 (BP Location: Left Arm, Patient Position: Sitting, Cuff Size: Large)   Pulse (!) 53   Ht 5' 11" (1.803 m)   Wt 259 lb (117.5 kg)   SpO2 97%   BMI 36.12 kg/m?    ? ?Wt Readings from Last 3 Encounters:  ?07/14/21 259 lb (117.5 kg)  ?04/28/21 261 lb (118.4 kg)  ?12/16/20 253 lb 6 oz (114.9 kg)  ?  ? ?GEN:  Well nourished, well developed in no acute distress ?HEENT: Normal ?NECK: No JVD; No carotid bruits ?LYMPHATICS: No lymphadenopathy ?CARDIAC: RRR, no murmurs, rubs, gallops ?RESPIRATORY:  Clear to auscultation without rales, wheezing or rhonchi  ?ABDOMEN: Soft, non-tender, non-distended ?MUSCULOSKELETAL:  No edema; No deformity  ?SKIN: Warm and dry ?NEUROLOGIC:  Alert and oriented x  3 ?PSYCHIATRIC:  Normal affect  ? ? ? ?  ? ?ASSESSMENT:   ? ?1. Persistent atrial fibrillation (HCC)   ?2. Typical atrial flutter (HCC)   ?3. Primary hypertension   ?4. Encounter for long-term current use

## 2021-07-14 ENCOUNTER — Ambulatory Visit: Payer: Medicare Other | Admitting: Cardiology

## 2021-07-14 ENCOUNTER — Encounter: Payer: Self-pay | Admitting: Cardiology

## 2021-07-14 ENCOUNTER — Encounter: Payer: Self-pay | Admitting: *Deleted

## 2021-07-14 VITALS — BP 144/74 | HR 53 | Ht 71.0 in | Wt 259.0 lb

## 2021-07-14 DIAGNOSIS — Z01812 Encounter for preprocedural laboratory examination: Secondary | ICD-10-CM

## 2021-07-14 DIAGNOSIS — Z79899 Other long term (current) drug therapy: Secondary | ICD-10-CM | POA: Diagnosis not present

## 2021-07-14 DIAGNOSIS — I4819 Other persistent atrial fibrillation: Secondary | ICD-10-CM

## 2021-07-14 DIAGNOSIS — I4891 Unspecified atrial fibrillation: Secondary | ICD-10-CM

## 2021-07-14 DIAGNOSIS — I483 Typical atrial flutter: Secondary | ICD-10-CM

## 2021-07-14 DIAGNOSIS — I10 Essential (primary) hypertension: Secondary | ICD-10-CM

## 2021-07-14 NOTE — Patient Instructions (Addendum)
Medication Instructions:  ?- Your physician recommends that you continue on your current medications as directed. Please refer to the Current Medication list given to you today. ? ?*If you need a refill on your cardiac medications before your next appointment, please call your pharmacy* ? ? ?Lab Work: ?- Your physician recommends that you return for lab work: BMP/ CBC ? ?Monday 10/25/21 ?[facility] Entrance at [facility] ?1st desk on the right to check in (Registration) ?Lab hours: Monday- Friday (7:30 am- 5:30 pm) ? ? ?If you have labs (blood work) drawn today and your tests are completely normal, you will receive your results only by: ?MyChart Message (if you have MyChart) OR ?A paper copy in the mail ?If you have any lab test that is abnormal or we need to change your treatment, we will call you to review the results. ? ? ?Testing/Procedures: ? ?1) Echocardiogram: ?- Your physician has requested that you have an echocardiogram. Echocardiography is a painless test that uses sound waves to create images of your heart. It provides your doctor with information about the size and shape of your heart and how well your heart?s chambers and valves are working. This procedure takes approximately one hour. There are no restrictions for this procedure. There is a possibility that an IV may need to be started during your test to inject an image enhancing agent. This is done to obtain more optimal pictures of your heart. Therefore we ask that you do at least drink some water prior to coming in to hydrate your veins.  ? ? ?2) Cardiac CT angiogram: Thursday 11/11/21 at 10:00 am ?- Your physician has requested that you have cardiac CT. Cardiac computed tomography (CT) is a painless test that uses an x-ray machine to take clear, detailed pictures of your heart.  ? ? ? ?Your cardiac CT will be scheduled at :  ? ? ?[address] ?[place], [place] [address] ?[phone] ? ? ?Please arrive 15 mins early for check-in and test prep. ? ?Please follow these instructions carefully (unless otherwise directed): ? ?Hold all erectile dysfunction medications at least 3 days (72 hrs) prior to test. ? ?On the Night Before the Test: ?Be sure to Drink plenty of water. ?Do not consume any caffeinated/decaffeinated beverages or chocolate 12 hours prior to your test. ?Do not take any antihistamines 12 hours prior to your test. ? ? ?On the Day of the Test: ?Drink plenty of water until 1 hour prior to the test. ?Do not eat any food 4 hours prior to the test. ?You may take your regular medications prior to the test.  ?HOLD Furosemide (Lasix) the morning of the test. ? ?     ?After the Test: ?Drink plenty of water. ?After receiving IV contrast, you may experience a mild flushed feeling. This is normal. ?On occasion, you may experience a mild rash up to 24 hours after the test. This is not dangerous. If this occurs, you can take Benadryl 25 mg and increase your fluid intake. ?If you experience trouble breathing, this can be serious. If it is severe call 911 IMMEDIATELY. If it is mild, please call our office. ?If you take any of these medications: Glipizide/Metformin, Avandament, Glucavance, please do not take 48 hours after completing test unless otherwise instructed. ? ?Some insurance companies will need an authorization prior to the service being performed.  ? ?For non-scheduling related questions, please contact the cardiac imaging nurse navigator should you have any  questions/concerns: ?Marchia Bond, Cardiac Imaging Nurse Navigator ?Gordy Clement, Cardiac Imaging Nurse Navigator ?[facility] ?Direct Office Dial: [phone]  ? ?For scheduling needs, including cancellations and rescheduling, please call [place], [phone]. ? ? ?3) Atrial Fibrillation Ablation: ?- Your physician has recommended that you have an ablation. Catheter ablation is a medical procedure  used to treat some cardiac arrhythmias (irregular heartbeats). During catheter ablation, a long, thin, flexible tube is put into a blood vessel in your groin (upper thigh), or neck. This tube is called an ablation catheter. It is then guided to your heart through the blood vessel. Radio frequency waves destroy small areas of heart tissue where abnormal heartbeats may cause an arrhythmia to start. Please see the instruction sheet given to you today. ? ? ? ?Follow-Up: ?At [facility], you and your health needs are our priority.  As part of our continuing mission to provide you with exceptional heart care, we have created designated Provider Care Teams.  These Care Teams include your primary Cardiologist (physician) and Advanced Practice Providers (APPs -  Physician Assistants and Nurse Practitioners) who all work together to provide you with the care you need, when you need it. ? ?We recommend signing up for the patient portal called "MyChart".  Sign up information is provided on this After Visit Summary.  MyChart is used to connect with patients for Virtual Visits (Telemedicine).  Patients are able to view lab/test results, encounter notes, upcoming appointments, etc.  Non-urgent messages can be sent to your provider as well.   ?To learn more about what you can do with MyChart, go to [email or web address].   ? ?Your next appointment:   ?1) 4 weeks (from 11/15/21) with the [facility] in [place] ? ?2) 3 months (from 11/15/21) with Dr. Quentin Ore in the [place] office ? ?The format for your next appointment:   ?In Person ? ?Provider:   ?As above   ? ? ?Other Instructions ? ?Echocardiogram ?An echocardiogram is a test that uses sound waves (ultrasound) to produce images of the heart. ?Images from an echocardiogram can provide important information about: ?Heart size and shape. ?The size and thickness and movement of your heart's walls. ?Heart muscle function and strength. ?Heart valve function or if  you have stenosis. Stenosis is when the heart valves are too narrow. ?If blood is flowing backward through the heart valves (regurgitation). ?A tumor or infectious growth around the heart valves. ?Areas of heart muscle that are not working well because of poor blood flow or injury from a heart attack. ?Aneurysm detection. An aneurysm is a weak or damaged part of an artery wall. The wall bulges out from the normal force of blood pumping through the body. ?Tell a health care provider about: ?Any allergies you have. ?All medicines you are taking, including vitamins, herbs, eye drops, creams, and over-the-counter medicines. ?Any blood disorders you have. ?Any surgeries you have had. ?Any medical conditions you have. ?Whether you are pregnant or may be pregnant. ?What are the risks? ?Generally, this is a safe test. However, problems may occur, including an allergic reaction to dye (contrast) that may be used during the test. ?What happens before the test? ?No specific preparation is needed. You may eat and drink normally. ?What happens during the test? ? ?You will take off your clothes from the waist up and put on a hospital gown. ?Electrodes or electrocardiogram (ECG)patches may be placed on your chest. The electrodes or patches are then connected to a device that  monitors your heart rate and rhythm. ?You will lie down on a table for an ultrasound exam. A gel will be applied to your chest to help sound waves pass through your skin. ?A handheld device, called a transducer, will be pressed against your chest and moved over your heart. The transducer produces sound waves that travel to your heart and bounce back (or "echo" back) to the transducer. These sound waves will be captured in real-time and changed into images of your heart that can be viewed on a video monitor. The images will be recorded on a computer and reviewed by your health care provider. ?You may be asked to change positions or hold your breath for a short  time. This makes it easier to get different views or better views of your heart. ?In some cases, you may receive contrast through an IV in one of your veins. This can improve the quality of the pictures from your hea

## 2021-07-22 ENCOUNTER — Other Ambulatory Visit: Payer: Self-pay | Admitting: Cardiovascular Disease

## 2021-08-03 ENCOUNTER — Ambulatory Visit (INDEPENDENT_AMBULATORY_CARE_PROVIDER_SITE_OTHER): Payer: Medicare Other

## 2021-08-03 DIAGNOSIS — I4819 Other persistent atrial fibrillation: Secondary | ICD-10-CM | POA: Diagnosis not present

## 2021-08-03 LAB — ECHOCARDIOGRAM COMPLETE
AR max vel: 3.05 cm2
AV Area VTI: 2.82 cm2
AV Area mean vel: 2.86 cm2
AV Mean grad: 5.5 mmHg
AV Peak grad: 9.8 mmHg
Ao pk vel: 1.57 m/s
Area-P 1/2: 4.29 cm2
S' Lateral: 4.2 cm

## 2021-08-03 MED ORDER — PERFLUTREN LIPID MICROSPHERE
1.0000 mL | INTRAVENOUS | Status: AC | PRN
  Administered 2021-08-03: 2 mL via INTRAVENOUS

## 2021-09-07 ENCOUNTER — Other Ambulatory Visit: Payer: Self-pay | Admitting: Cardiovascular Disease

## 2021-10-04 ENCOUNTER — Other Ambulatory Visit: Payer: Self-pay | Admitting: Cardiology

## 2021-10-15 ENCOUNTER — Other Ambulatory Visit: Payer: Self-pay | Admitting: Cardiovascular Disease

## 2021-10-21 ENCOUNTER — Other Ambulatory Visit: Payer: Self-pay | Admitting: Cardiovascular Disease

## 2021-10-25 ENCOUNTER — Other Ambulatory Visit
Admission: RE | Admit: 2021-10-25 | Discharge: 2021-10-25 | Disposition: A | Discharge status: Home / Self Care | Payer: Medicare Other | Attending: Cardiology | Admitting: Cardiology

## 2021-10-25 DIAGNOSIS — Z01812 Encounter for preprocedural laboratory examination: Secondary | ICD-10-CM | POA: Insufficient documentation

## 2021-10-25 DIAGNOSIS — I4819 Other persistent atrial fibrillation: Secondary | ICD-10-CM | POA: Insufficient documentation

## 2021-10-25 LAB — CBC
HCT: 38.4 % — ABNORMAL LOW (ref 39.0–52.0)
Hemoglobin: 12.8 g/dL — ABNORMAL LOW (ref 13.0–17.0)
MCH: 30 pg (ref 26.0–34.0)
MCHC: 33.3 g/dL (ref 30.0–36.0)
MCV: 90.1 fL (ref 80.0–100.0)
Platelets: 217 10*3/uL (ref 150–400)
RBC: 4.26 MIL/uL (ref 4.22–5.81)
RDW: 13.4 % (ref 11.5–15.5)
WBC: 8.3 10*3/uL (ref 4.0–10.5)
nRBC: 0 % (ref 0.0–0.2)

## 2021-10-25 LAB — BASIC METABOLIC PANEL
Anion gap: 9 (ref 5–15)
BUN: 26 mg/dL — ABNORMAL HIGH (ref 8–23)
CO2: 21 mmol/L — ABNORMAL LOW (ref 22–32)
Calcium: 8.9 mg/dL (ref 8.9–10.3)
Chloride: 107 mmol/L (ref 98–111)
Creatinine, Ser: 1.26 mg/dL — ABNORMAL HIGH (ref 0.61–1.24)
GFR, Estimated: >60 mL/min (ref >60)
Glucose, Bld: 171 mg/dL — ABNORMAL HIGH (ref 70–99)
Potassium: 4.3 mmol/L (ref 3.5–5.1)
Sodium: 137 mmol/L (ref 135–145)

## 2021-11-11 ENCOUNTER — Telehealth (HOSPITAL_COMMUNITY): Payer: Self-pay | Admitting: *Deleted

## 2021-11-11 NOTE — Telephone Encounter (Signed)
Reaching out to patient to offer assistance regarding upcoming cardiac imaging study; pt verbalizes understanding of appt date/time, parking situation and where to check in, pre-test NPO status and and verified current allergies; name and call back number provided for further questions should they arise  Gordy Clement RN Navigator Cardiac [place] [facility] [phone] office [phone] cell  Patient aware to arrive at 9:45am.

## 2021-11-12 ENCOUNTER — Ambulatory Visit
Admission: RE | Admit: 2021-11-12 | Discharge: 2021-11-12 | Disposition: A | Discharge status: Home / Self Care | Payer: Medicare Other | Source: Ambulatory Visit | Attending: Cardiology | Admitting: Cardiology

## 2021-11-12 DIAGNOSIS — I4819 Other persistent atrial fibrillation: Secondary | ICD-10-CM

## 2021-11-12 MED ORDER — IOHEXOL 350 MG/ML SOLN
100.0000 mL | Freq: Once | INTRAVENOUS | Status: AC | PRN
  Administered 2021-11-12: 100 mL via INTRAVENOUS

## 2021-11-15 ENCOUNTER — Ambulatory Visit (HOSPITAL_COMMUNITY): Payer: Medicare Other | Admitting: Anesthesiology

## 2021-11-15 ENCOUNTER — Other Ambulatory Visit (HOSPITAL_COMMUNITY): Payer: Self-pay

## 2021-11-15 ENCOUNTER — Encounter (HOSPITAL_COMMUNITY)
Admission: RE | Disposition: A | Discharge status: Home / Self Care | Payer: Medicare Other | Source: Home / Self Care | Attending: Cardiology

## 2021-11-15 ENCOUNTER — Ambulatory Visit (HOSPITAL_BASED_OUTPATIENT_CLINIC_OR_DEPARTMENT_OTHER): Payer: Medicare Other | Admitting: Anesthesiology

## 2021-11-15 ENCOUNTER — Ambulatory Visit (HOSPITAL_COMMUNITY)
Admission: RE | Admit: 2021-11-15 | Discharge: 2021-11-15 | Disposition: A | Discharge status: Home / Self Care | Payer: Medicare Other | Attending: Cardiology | Admitting: Cardiology

## 2021-11-15 ENCOUNTER — Other Ambulatory Visit: Payer: Self-pay

## 2021-11-15 DIAGNOSIS — I4819 Other persistent atrial fibrillation: Secondary | ICD-10-CM | POA: Insufficient documentation

## 2021-11-15 DIAGNOSIS — I483 Typical atrial flutter: Secondary | ICD-10-CM | POA: Diagnosis not present

## 2021-11-15 DIAGNOSIS — Z87891 Personal history of nicotine dependence: Secondary | ICD-10-CM | POA: Insufficient documentation

## 2021-11-15 DIAGNOSIS — E119 Type 2 diabetes mellitus without complications: Secondary | ICD-10-CM | POA: Diagnosis not present

## 2021-11-15 DIAGNOSIS — Z7901 Long term (current) use of anticoagulants: Secondary | ICD-10-CM | POA: Insufficient documentation

## 2021-11-15 DIAGNOSIS — I10 Essential (primary) hypertension: Secondary | ICD-10-CM | POA: Diagnosis not present

## 2021-11-15 DIAGNOSIS — I4891 Unspecified atrial fibrillation: Secondary | ICD-10-CM | POA: Diagnosis not present

## 2021-11-15 DIAGNOSIS — I2510 Atherosclerotic heart disease of native coronary artery without angina pectoris: Secondary | ICD-10-CM

## 2021-11-15 HISTORY — PX: ATRIAL FIBRILLATION ABLATION: EP1191

## 2021-11-15 LAB — POCT ACTIVATED CLOTTING TIME
Activated Clotting Time: 311 seconds
Activated Clotting Time: 401 seconds
Activated Clotting Time: 353 seconds

## 2021-11-15 LAB — GLUCOSE, CAPILLARY
Glucose-Capillary: 190 mg/dL — ABNORMAL HIGH (ref 70–99)
Glucose-Capillary: 237 mg/dL — ABNORMAL HIGH (ref 70–99)

## 2021-11-15 SURGERY — ATRIAL FIBRILLATION ABLATION
Anesthesia: General

## 2021-11-15 MED ORDER — SUGAMMADEX SODIUM 200 MG/2ML IV SOLN
INTRAVENOUS | Status: DC | PRN
  Administered 2021-11-15: 200 mg via INTRAVENOUS

## 2021-11-15 MED ORDER — ROCURONIUM BROMIDE 10 MG/ML (PF) SYRINGE
PREFILLED_SYRINGE | INTRAVENOUS | Status: DC | PRN
  Administered 2021-11-15: 100 mg via INTRAVENOUS

## 2021-11-15 MED ORDER — PROTAMINE SULFATE 10 MG/ML IV SOLN
INTRAVENOUS | Status: DC | PRN
  Administered 2021-11-15: 30 mg via INTRAVENOUS

## 2021-11-15 MED ORDER — SODIUM CHLORIDE 0.9% FLUSH
3.0000 mL | Freq: Two times a day (BID) | INTRAVENOUS | Status: DC
Start: 2021-11-15 — End: 2021-11-15

## 2021-11-15 MED ORDER — FENTANYL CITRATE (PF) 250 MCG/5ML IJ SOLN
INTRAMUSCULAR | Status: DC | PRN
  Administered 2021-11-15: 100 ug via INTRAVENOUS

## 2021-11-15 MED ORDER — PHENYLEPHRINE HCL-NACL 20-0.9 MG/250ML-% IV SOLN
INTRAVENOUS | Status: DC | PRN
  Administered 2021-11-15: 50 ug/min via INTRAVENOUS
  Administered 2021-11-15: 30 ug/min via INTRAVENOUS

## 2021-11-15 MED ORDER — SODIUM CHLORIDE 0.9% FLUSH: 3.0000 mL | INTRAVENOUS | Status: DC | PRN

## 2021-11-15 MED ORDER — COLCHICINE 0.6 MG PO TABS
0.6000 mg | ORAL_TABLET | Freq: Two times a day (BID) | ORAL | 0 refills | Status: DC
  Filled 2021-11-15: qty 10, 5d supply, fill #0

## 2021-11-15 MED ORDER — PANTOPRAZOLE SODIUM 40 MG PO TBEC
40.0000 mg | DELAYED_RELEASE_TABLET | Freq: Every day | ORAL | 0 refills | Status: DC
  Filled 2021-11-15: qty 45, 45d supply, fill #0

## 2021-11-15 MED ORDER — PROPOFOL 10 MG/ML IV BOLUS
INTRAVENOUS | Status: DC | PRN
  Administered 2021-11-15: 200 mg via INTRAVENOUS

## 2021-11-15 MED ORDER — SODIUM CHLORIDE 0.9 % IV SOLN: 250.0000 mL | INTRAVENOUS | Status: DC | PRN

## 2021-11-15 MED ORDER — HEPARIN (PORCINE) IN NACL 1000-0.9 UT/500ML-% IV SOLN
INTRAVENOUS | Status: DC | PRN
  Administered 2021-11-15 (×3): 500 mL

## 2021-11-15 MED ORDER — DEXAMETHASONE SODIUM PHOSPHATE 10 MG/ML IJ SOLN
INTRAMUSCULAR | Status: DC | PRN
  Administered 2021-11-15: 10 mg via INTRAVENOUS

## 2021-11-15 MED ORDER — HEPARIN SODIUM (PORCINE) 1000 UNIT/ML IJ SOLN
INTRAMUSCULAR | Status: DC | PRN
  Administered 2021-11-15: 4000 [IU] via INTRAVENOUS
  Administered 2021-11-15: 18000 [IU] via INTRAVENOUS

## 2021-11-15 MED ORDER — ONDANSETRON HCL 4 MG/2ML IJ SOLN
INTRAMUSCULAR | Status: DC | PRN
  Administered 2021-11-15: 4 mg via INTRAVENOUS

## 2021-11-15 MED ORDER — SODIUM CHLORIDE 0.9 % IV SOLN: INTRAVENOUS | Status: DC

## 2021-11-15 MED ORDER — HEPARIN SODIUM (PORCINE) 1000 UNIT/ML IJ SOLN
INTRAMUSCULAR | Status: AC
  Filled 2021-11-15: qty 10

## 2021-11-15 MED ORDER — ONDANSETRON HCL 4 MG/2ML IJ SOLN: 4.0000 mg | Freq: Four times a day (QID) | INTRAMUSCULAR | Status: DC | PRN

## 2021-11-15 MED ORDER — MIDAZOLAM HCL 5 MG/5ML IJ SOLN
INTRAMUSCULAR | Status: DC | PRN
  Administered 2021-11-15: 2 mg via INTRAVENOUS

## 2021-11-15 MED ORDER — LIDOCAINE 2% (20 MG/ML) 5 ML SYRINGE
INTRAMUSCULAR | Status: DC | PRN
  Administered 2021-11-15: 100 mg via INTRAVENOUS

## 2021-11-15 MED ORDER — HEPARIN SODIUM (PORCINE) 1000 UNIT/ML IJ SOLN
INTRAMUSCULAR | Status: DC | PRN
  Administered 2021-11-15: 1000 [IU] via INTRAVENOUS

## 2021-11-15 MED ORDER — ACETAMINOPHEN 325 MG PO TABS
650.0000 mg | ORAL_TABLET | ORAL | Status: DC | PRN
  Administered 2021-11-15: 650 mg via ORAL
  Filled 2021-11-15: qty 2

## 2021-11-15 MED ORDER — COLCHICINE 0.6 MG PO TABS
0.6000 mg | ORAL_TABLET | Freq: Two times a day (BID) | ORAL | Status: DC
  Administered 2021-11-15: 0.6 mg via ORAL
  Filled 2021-11-15: qty 1

## 2021-11-15 MED ORDER — HEPARIN (PORCINE) IN NACL 1000-0.9 UT/500ML-% IV SOLN
INTRAVENOUS | Status: AC
  Filled 2021-11-15: qty 1500

## 2021-11-15 MED ORDER — PANTOPRAZOLE SODIUM 40 MG PO TBEC
40.0000 mg | DELAYED_RELEASE_TABLET | Freq: Every day | ORAL | Status: DC
  Administered 2021-11-15: 40 mg via ORAL
  Filled 2021-11-15: qty 1

## 2021-11-15 SURGICAL SUPPLY — 19 items
CATH 8FR REPROCESSED SOUNDSTAR (CATHETERS) ×2 IMPLANT
CATH 8FR SOUNDSTAR REPROCESSED (CATHETERS) IMPLANT
CATH ABLAT QDOT MICRO BI TC DF (CATHETERS) ×1 IMPLANT
CATH OCTARAY 2.0 F 3-3-3-3-3 (CATHETERS) ×1 IMPLANT
CATH S CIRCA THERM PROBE 10F (CATHETERS) ×1 IMPLANT
CATH WEB BI DIR CSDF CRV REPRO (CATHETERS) ×1 IMPLANT
CLOSURE PERCLOSE PROSTYLE (VASCULAR PRODUCTS) ×3 IMPLANT
COVER SWIFTLINK CONNECTOR (BAG) ×2 IMPLANT
MAT PREVALON FULL STRYKER (MISCELLANEOUS) ×1 IMPLANT
PACK EP LATEX FREE (CUSTOM PROCEDURE TRAY) ×2
PACK EP LF (CUSTOM PROCEDURE TRAY) ×1 IMPLANT
PAD DEFIB RADIO PHYSIO CONN (PAD) ×2 IMPLANT
PATCH CARTO3 (PAD) ×1 IMPLANT
SHEATH BAYLIS TRANSSEPTAL 98CM (NEEDLE) ×1 IMPLANT
SHEATH CARTO VIZIGO SM CVD (SHEATH) ×1 IMPLANT
SHEATH PINNACLE 8F 10CM (SHEATH) ×2 IMPLANT
SHEATH PINNACLE 9F 10CM (SHEATH) ×1 IMPLANT
SHEATH PROBE COVER 6X72 (BAG) ×1 IMPLANT
TUBING SMART ABLATE COOLFLOW (TUBING) ×1 IMPLANT

## 2021-11-15 NOTE — Discharge Instructions (Addendum)
Post procedure care instructions No driving for 4 days. No lifting over 5 lbs for 1 week. No vigorous or sexual activity for 1 week. You may return to work/your usual activities on 11/23/21. Keep procedure site clean & dry. If you notice increased pain, swelling, bleeding or pus, call/return!  You may shower after 24 hours, but no soaking in baths/hot tubs/pools for 1 week.    You have an appointment set up with the [facility].  Multiple studies have shown that being followed by a dedicated atrial fibrillation clinic in addition to the standard care you receive from your other physicians improves health. We believe that enrollment in the atrial fibrillation clinic will allow [place] to better care for you.   The phone number to the [facility] is [phone]. The clinic is staffed Monday through Friday from 8:30am to 5pm.  Parking Directions: The clinic is located in the Heart and Vascular Building connected to [facility]. 1)From [address] turn on to [address] and go to the 3rd entrance  (Heart and Vascular entrance) on the right. 2)Look to the right for Heart &Vascular Parking Garage. 3)A code for the entrance is required, for Sept is 1502.   4)Take the elevators to the 1st floor. Registration is in the room with the glass walls at the end of the hallway.  If you have any trouble parking or locating the clinic, please don't hesitate to call [phone].     Cardiac Ablation, Care After  This sheet gives you information about how to care for yourself after your procedure. Your health care provider may also give you more specific instructions. If you have problems or questions, contact your health care provider. What can I expect after the procedure? After the procedure, it is common to have: Bruising around your puncture site. Tenderness around your puncture site. Skipped heartbeats. Tiredness (fatigue).  Follow these instructions at  home: Puncture site care  Follow instructions from your health care provider about how to take care of your puncture site. Make sure you: If present, leave stitches (sutures), skin glue, or adhesive strips in place. These skin closures may need to stay in place for up to 2 weeks. If adhesive strip edges start to loosen and curl up, you may trim the loose edges. Do not remove adhesive strips completely unless your health care provider tells you to do that. If a large square bandage is present, this may be removed 24 hours after surgery.  Check your puncture site every day for signs of infection. Check for: Redness, swelling, or pain. Fluid or blood. If your puncture site starts to bleed, lie down on your back, apply firm pressure to the area, and contact your health care provider. Warmth. Pus or a bad smell. A pea or small marble sized lump at the site is normal and can take up to three months to resolve.  Driving Do not drive for at least 4 days after your procedure or however long your health care provider recommends. (Do not resume driving if you have previously been instructed not to drive for other health reasons.) Do not drive or use heavy machinery while taking prescription pain medicine. Activity Avoid activities that take a lot of effort for at least 7 days after your procedure. Do not lift anything that is heavier than 5 lb (4.5 kg) for one week.  No sexual activity for 1 week.  Return to your normal activities as told by your health care provider. Ask  your health care provider what activities are safe for you. General instructions Take over-the-counter and prescription medicines only as told by your health care provider. Do not use any products that contain nicotine or tobacco, such as cigarettes and e-cigarettes. If you need help quitting, ask your health care provider. You may shower after 24 hours, but Do not take baths, swim, or use a hot tub for 1 week.  Do not drink alcohol for  24 hours after your procedure. Keep all follow-up visits as told by your health care provider. This is important. Contact a health care provider if: You have redness, mild swelling, or pain around your puncture site. You have fluid or blood coming from your puncture site that stops after applying firm pressure to the area. Your puncture site feels warm to the touch. You have pus or a bad smell coming from your puncture site. You have a fever. You have chest pain or discomfort that spreads to your neck, jaw, or arm. You are sweating a lot. You feel nauseous. You have a fast or irregular heartbeat. You have shortness of breath. You are dizzy or light-headed and feel the need to lie down. You have pain or numbness in the arm or leg closest to your puncture site. Get help right away if: Your puncture site suddenly swells. Your puncture site is bleeding and the bleeding does not stop after applying firm pressure to the area. These symptoms may represent a serious problem that is an emergency. Do not wait to see if the symptoms will go away. Get medical help right away. Call your local emergency services (911 in the [place] Do not drive yourself to the hospital. Summary After the procedure, it is normal to have bruising and tenderness at the puncture site in your groin, neck, or forearm. Check your puncture site every day for signs of infection. Get help right away if your puncture site is bleeding and the bleeding does not stop after applying firm pressure to the area. This is a medical emergency. This information is not intended to replace advice given to you by your health care provider. Make sure you discuss any questions you have with your health care provider.

## 2021-11-15 NOTE — Transfer of Care (Signed)
Immediate Anesthesia Transfer of Care Note  Patient: Douglas Hunter  Procedure(s) Performed: ATRIAL FIBRILLATION ABLATION  Patient Location: Cath Lab  Anesthesia Type:General  Level of Consciousness: awake, alert  and oriented  Airway & Oxygen Therapy: Patient Spontanous Breathing and Patient connected to nasal cannula oxygen  Post-op Assessment: Report given to RN, Post -op Vital signs reviewed and stable and Patient moving all extremities X 4  Post vital signs: Reviewed and stable  Last Vitals:  Vitals Value Taken Time  BP 174/68 11/15/21 1020  Temp 36.4 C 11/15/21 1017  Pulse 51 11/15/21 1020  Resp 23 11/15/21 1020  SpO2 97 % 11/15/21 1020  Vitals shown include unvalidated device data.  Last Pain:  Vitals:   11/15/21 1017  TempSrc: Temporal  PainSc: Asleep         Complications: There were no known notable events for this encounter.

## 2021-11-15 NOTE — Anesthesia Preprocedure Evaluation (Addendum)
Anesthesia Evaluation  Patient identified by MRN, date of birth, ID band Patient awake    Reviewed: Allergy & Precautions, NPO status , Patient's Chart, lab work & pertinent test results  History of Anesthesia Complications Negative for: history of anesthetic complications  Airway Mallampati: III  TM Distance: >3 FB Neck ROM: Full    Dental no notable dental hx. (+) Dental Advisory Given   Pulmonary sleep apnea , former smoker,    Pulmonary exam normal        Cardiovascular hypertension, Pt. on medications and Pt. on home beta blockers + CAD and + Past MI  + dysrhythmias Atrial Fibrillation  Rhythm:Irregular Rate:Normal   '23 TTE - EF 60 to 65%. Grade II diastolic dysfunction (pseudonormalization). There is mildly elevated pulmonary artery systolic pressure. Left atrial size was moderately dilated. No significant valvulopathy    Neuro/Psych negative neurological ROS  negative psych ROS   GI/Hepatic Neg liver ROS, GERD  ,  Endo/Other  diabetes, Type 2, Insulin Dependent Obesity   Renal/GU negative Renal ROS     Musculoskeletal negative musculoskeletal ROS (+)   Abdominal   Peds  Hematology  On xarelto    Anesthesia Other Findings   Reproductive/Obstetrics                           Anesthesia Physical Anesthesia Plan  ASA: 3  Anesthesia Plan: General   Post-op Pain Management: Minimal or no pain anticipated   Induction: Intravenous  PONV Risk Score and Plan: 2 and Treatment may vary due to age or medical condition, Ondansetron and Dexamethasone  Airway Management Planned: Oral ETT  Additional Equipment: None  Intra-op Plan:   Post-operative Plan: Extubation in OR  Informed Consent: I have reviewed the patients History and Physical, chart, labs and discussed the procedure including the risks, benefits and alternatives for the proposed anesthesia with the patient or  authorized representative who has indicated his/her understanding and acceptance.     Dental advisory given  Plan Discussed with: CRNA and Anesthesiologist  Anesthesia Plan Comments:        Anesthesia Quick Evaluation

## 2021-11-15 NOTE — Progress Notes (Signed)
Pt unable to void/ states this is his "usual" he can not void while in bed, Dr Quentin Ore gave a verbal order for in/out cath

## 2021-11-15 NOTE — Anesthesia Procedure Notes (Signed)
Procedure Name: Intubation Date/Time: 11/15/2021 7:44 AM  Performed by: Kyung Rudd, CRNAPre-anesthesia Checklist: Patient identified, Emergency Drugs available, Suction available and Patient being monitored Patient Re-evaluated:Patient Re-evaluated prior to induction Oxygen Delivery Method: Circle System Utilized Preoxygenation: Pre-oxygenation with 100% oxygen Induction Type: IV induction Ventilation: Mask ventilation without difficulty Laryngoscope Size: Mac and 4 Grade View: Grade I Tube type: Oral Tube size: 7.5 mm Number of attempts: 1 Airway Equipment and Method: Stylet Placement Confirmation: ETT inserted through vocal cords under direct vision, positive ETCO2 and breath sounds checked- equal and bilateral Secured at: 23 cm Tube secured with: Tape Dental Injury: Teeth and Oropharynx as per pre-operative assessment

## 2021-11-15 NOTE — H&P (Signed)
Electrophysiology Office Follow up Visit Note:     Date:  11/15/2021    ID:  Douglas Hunter, DOB 1955/04/17, MRN [id number]   PCP:  Sofie Hartigan, MD           [facility] Cardiologist:  [name], MD  [facility] Electrophysiologist:  Vickie Epley, MD      Interval History:     Douglas Hunter is a 66 y.o. male who presents for a follow up visit. They were last seen in clinic 04/28/2021.  He is on dofetilide for his persistent atrial fibrillation.  At that appointment he was still having breakthrough episodes of atrial fibrillation and we discussed using catheter ablation to augment his rhythm control.  He presents today to discuss the status of his atrial fibrillation and whether or not to proceed with catheter ablation.   The patient is with his wife today in clinic.  He reports continued episodes of atrial fibrillation's that are symptomatic with elevated ventricular rates.  They occur at least 1 to 2 times per week.   Today he presents for PVI. Procedure reviwed.    Objective      Past Medical History:  Diagnosis Date   AKI (acute kidney injury) ([place]) 03/13/2020   Atrial flutter ([place])     Coronary artery disease 08/2011    a. NSTEMI 08/2011, LHC w/ severe disease of LCx with good collaterals, lesion was high-risk for intervention due to a steep angulation of the LCx coming from the left main coronary artery as well as heavy calcifications, discused at cath conference w/ consensus advising med Rx; b. 03/2020 Cath: LM nl, LAD 69m LCX 100p w/ R->L and L->L collats. RCA dominant, nl. EF 55%. PA 33/6/19, PCWP 8.   GERD (gastroesophageal reflux disease)     History of echocardiogram      a. 01/2020 Echo: EF 60-65%, no rwma. Sev elev PASP. Mod dil LA.   Hyperlipidemia     Hypertension     Myocardial infarction ([facility]     Paroxysmal atrial fibrillation ([place]      a. s/p ablation 01/2013; b CHADS2VASc => 3 (HTN, DM, vascular disease); c. on Coumadin; d. 12/2019 Amio  d/c'd 2/2 hyperthyroidism.   Poorly controlled diabetes mellitus ([place]     RBBB     Sleep apnea             Past Surgical History:  Procedure Laterality Date   ABLATION   01/29/2013    PVI and CTI by Dr ARayann Hemanfor atrial flutter and paroxysmal atrial fibrillation   ANTERIOR CERVICAL DECOMP/DISCECTOMY FUSION N/A 04/18/2018    Procedure: ANTERIOR CERVICAL DECOMPRESSION/DISCECTOMY FUSION 2 LEVELS C5-7;  Surgeon: YMeade Maw MD;  Location: [facility] ORS;  Service: Neurosurgery;  Laterality: N/A;   ATRIAL FIBRILLATION ABLATION N/A 01/29/2013    Procedure: ATRIAL FIBRILLATION ABLATION;  Surgeon: JCoralyn Mark MD;  [facility];  Service: Cardiovascular;  Laterality: N/A;   CARDIAC CATHETERIZATION   08/03/2011   COLONOSCOPY WITH PROPOFOL N/A 02/02/2016    Procedure: COLONOSCOPY WITH PROPOFOL;  Surgeon: DLucilla Lame MD;  Location: [facility] ENDOSCOPY;  Service: Endoscopy;  Laterality: N/A;   LUMBAR LAMINECTOMY/DECOMPRESSION MICRODISCECTOMY N/A 06/07/2017    Procedure: LUMBAR LAMINECTOMY/DECOMPRESSION MICRODISCECTOMY 2 [id number],O3-2  Surgeon: YMeade Maw MD;  Location: [facility] ORS;  Service: Neurosurgery;  Laterality: N/A;   RIGHT/LEFT HEART CATH AND CORONARY ANGIOGRAPHY N/A 03/06/2020    Procedure: RIGHT/LEFT HEART CATH AND CORONARY ANGIOGRAPHY;  Surgeon: GMinna Merritts MD;  Location: [place] CV LAB;  Service: Cardiovascular;  Laterality: N/A;   TEE WITHOUT CARDIOVERSION N/A 01/28/2013    Procedure: TRANSESOPHAGEAL ECHOCARDIOGRAM (TEE);  Surgeon: Lelon Perla, MD;  Location: [facility] ENDOSCOPY;  Service: Cardiovascular;  Laterality: N/A;      Current Medications: Active Medications      Current Meds  Medication Sig   acetaminophen (TYLENOL) 500 MG tablet Take 500 mg by mouth every 6 (six) hours as needed for mild pain or moderate pain.    amLODipine (NORVASC) 5 MG tablet TAKE 1 TABLET BY MOUTH ONCE DAILY   atorvastatin (LIPITOR) 80 MG tablet Take 80 mg by mouth at  bedtime.   benazepril (LOTENSIN) 40 MG tablet TAKE 1 TABLET(40 MG) BY MOUTH DAILY   cholecalciferol (VITAMIN D3) 25 MCG (1000 UNIT) tablet Take 1,000 Units by mouth daily.   Continuous Blood Gluc Sensor (FREESTYLE LIBRE 14 DAY SENSOR) MISC SMARTSIG:1 Kit(s) Topical Every 2 Weeks   dofetilide (TIKOSYN) 500 MCG capsule TAKE 1 CAPSULE BY MOUTH TWICE DAILY   ezetimibe (ZETIA) 10 MG tablet TAKE 1 TABLET(10 MG) BY MOUTH DAILY   furosemide (LASIX) 20 MG tablet TAKE 1 TABLET BY MOUTH TWICE DAILY AS NEEDED   insulin glargine (LANTUS) 100 UNIT/ML injection Inject 65 Units into the skin daily.   insulin lispro (HUMALOG) 100 UNIT/ML KiwkPen Inject 10-16 Units into the skin 3 (three) times daily with meals. Sliding Scale   INSULIN SYRINGE 1CC/29G 29G X 1/2" 1 ML MISC USE AS DIRECTED   metoprolol succinate (TOPROL XL) 25 MG 24 hr tablet Take 1 tablet (25 mg total) by mouth daily.   nitroGLYCERIN (NITROSTAT) 0.4 MG SL tablet DISSOLVE 1 TABLET UNDER THE TONGUE EVERY 5 MINUTES AS NEEDED FOR CHEST PAIN   ONE TOUCH ULTRA TEST test strip TEST THREE TIMES A DAY AS DIRECTED.   rivaroxaban (XARELTO) 20 MG TABS tablet Take 1 tablet (20 mg total) by mouth daily with supper.   sildenafil (REVATIO) 20 MG tablet Take 1 tablet (20 mg total) by mouth 3 (three) times daily as needed.        Allergies:   Patient has no known allergies.    Social History         Socioeconomic History   Marital status: Married      Spouse name: Not on file   Number of children: Not on file   Years of education: Not on file   Highest education level: Not on file  Occupational History   Not on file  Tobacco Use   Smoking status: Former      Packs/day: 1.50      Years: 35.00      Pack years: 52.50      Types: Cigarettes      Quit date: 01/16/2008      Years since quitting: 13.5   Smokeless tobacco: Never  Vaping Use   Vaping Use: Never used  Substance and Sexual Activity   Alcohol use: Not Currently      Comment: occasional    Drug use: No   Sexual activity: Yes  Other Topics Concern   Not on file  Social History Narrative    Pt lives in [place] (right outside of [place])    Works for a [employer] and exercise psychologist in [place] Strain: Not on [employer] Insecurity: Not on file  Transportation Needs: Not on file  Physical Activity: Not on file  Stress: Not on file  Social Connections: Not on file      Family History: The patient's family history includes Cancer in his father; Diabetes in his son; Heart attack in his maternal uncle; Heart attack (age of onset: 62) in his father; Heart disease in his mother and paternal grandfather; Stroke in his mother.   ROS:   Please see the history of present illness.    All other systems reviewed and are negative.   EKGs/Labs/Other Studies Reviewed:     The following studies were reviewed today:     EKG:  The ekg ordered today demonstrates sinus rhythm.  Right bundle branch block.  QTc 459 ms.   Recent Labs: 04/28/2021: BUN 26; Creatinine, Ser 1.22; Magnesium 2.0; Potassium 4.8; Sodium 138  Recent Lipid Panel Labs (Brief)          Component Value Date/Time    CHOL 162 01/13/2015 0854    CHOL 112 01/12/2013 0013    TRIG 62 01/13/2015 0854    TRIG 80 01/12/2013 0013    HDL 47 01/12/2013 0013    VLDL 12 01/13/2015 0854    VLDL 16 01/12/2013 0013    LDLCALC 49 01/12/2013 0013        Physical Exam:     VS:  BP 175/72 (BP Location: Left Arm, Patient Position: Sitting, Cuff Size: Large)   Pulse (!) 52   Ht 5' 11"  (1.803 m)   Wt 259 lb (117.5 kg)   SpO2 97%   BMI 36.12 kg/m         Wt Readings from Last 3 Encounters:  07/14/21 259 lb (117.5 kg)  04/28/21 261 lb (118.4 kg)  12/16/20 253 lb 6 oz (114.9 kg)      GEN:  Well nourished, well developed in no acute distress HEENT: Normal NECK: No JVD; No carotid bruits LYMPHATICS: No lymphadenopathy CARDIAC: RRR, no murmurs, rubs,  gallops RESPIRATORY:  Clear to auscultation without rales, wheezing or rhonchi  ABDOMEN: Soft, non-tender, non-distended MUSCULOSKELETAL:  No edema; No deformity  SKIN: Warm and dry NEUROLOGIC:  Alert and oriented x 3 PSYCHIATRIC:  Normal affect            Assessment ASSESSMENT:     1. Persistent atrial fibrillation ([place])   2. Typical atrial flutter ([place])   3. Primary hypertension   4. Encounter for long-term current use of high risk medication     PLAN:     In order of problems listed above:   #Persistent atrial fibrillation Continues to have breakthrough episodes of atrial fibrillation despite treatment with Tikosyn.  I discussed treatment options including continued Tikosyn and Xarelto versus repeat ablation.  He is decided to pursue repeat ablation.  We will plan to recheck the pulmonary veins, ablate the posterior wall, recheck the CTI and perform EP study to assess for any additional inducible atrial arrhythmias.  He would need a CT scan prior to the procedure.  He will also need an echocardiogram prior to the procedure.  He will continue taking Tikosyn.  QTc acceptable for continued use.   Risk, benefits, and alternatives to EP study and radiofrequency ablation for afib were also discussed in detail today. These risks include but are not limited to stroke, bleeding, vascular damage, tamponade, perforation, damage to the esophagus, lungs, and other structures, pulmonary vein stenosis, worsening renal function, and death. The patient understands these risk and wishes to proceed.  We will therefore proceed with catheter ablation at the next available time.  Carto,  ICE, anesthesia are requested for the procedure.  Will also obtain CT PV protocol prior to the procedure to exclude LAA thrombus and further evaluate atrial anatomy.     Presents for PVI today. Procedure reviewed.         Signed, Lars Mage, MD, [facility], [facility] 11/15/2021   Electrophysiology [facility]

## 2021-11-16 ENCOUNTER — Encounter (HOSPITAL_COMMUNITY): Payer: Self-pay | Admitting: Cardiology

## 2021-11-17 NOTE — Anesthesia Postprocedure Evaluation (Signed)
Anesthesia Post Note  Patient: Douglas Hunter  Procedure(s) Performed: ATRIAL FIBRILLATION ABLATION     Patient location during evaluation: PACU Anesthesia Type: General Level of consciousness: awake Pain management: pain level controlled Vital Signs Assessment: post-procedure vital signs reviewed and stable Respiratory status: spontaneous breathing Cardiovascular status: stable Postop Assessment: no apparent nausea or vomiting Anesthetic complications: no   There were no known notable events for this encounter.  Last Vitals:  Vitals:   11/15/21 1300 11/15/21 1312  BP: (!) 166/80 (!) 144/71  Pulse: (!) 58 (!) 50  Resp: (!) 21 (!) 22  Temp:    SpO2: 95% 96%    Last Pain:  Vitals:   11/15/21 1202  TempSrc:   PainSc: [name]

## 2021-12-06 ENCOUNTER — Other Ambulatory Visit: Payer: Self-pay | Admitting: Cardiovascular Disease

## 2021-12-09 ENCOUNTER — Ambulatory Visit (HOSPITAL_COMMUNITY)
Admission: RE | Admit: 2021-12-09 | Discharge: 2021-12-09 | Disposition: A | Discharge status: Home / Self Care | Payer: Medicare Other | Source: Ambulatory Visit | Attending: Physician Assistant | Admitting: Physician Assistant

## 2021-12-09 ENCOUNTER — Encounter (HOSPITAL_COMMUNITY): Payer: Self-pay | Admitting: Physician Assistant

## 2021-12-09 VITALS — BP 152/74 | HR 50 | Ht 71.0 in | Wt 265.0 lb

## 2021-12-09 DIAGNOSIS — I4892 Unspecified atrial flutter: Secondary | ICD-10-CM | POA: Diagnosis not present

## 2021-12-09 DIAGNOSIS — E119 Type 2 diabetes mellitus without complications: Secondary | ICD-10-CM | POA: Insufficient documentation

## 2021-12-09 DIAGNOSIS — K219 Gastro-esophageal reflux disease without esophagitis: Secondary | ICD-10-CM | POA: Diagnosis not present

## 2021-12-09 DIAGNOSIS — I2510 Atherosclerotic heart disease of native coronary artery without angina pectoris: Secondary | ICD-10-CM | POA: Diagnosis not present

## 2021-12-09 DIAGNOSIS — I252 Old myocardial infarction: Secondary | ICD-10-CM | POA: Insufficient documentation

## 2021-12-09 DIAGNOSIS — Z6836 Body mass index (BMI) 36.0-36.9, adult: Secondary | ICD-10-CM | POA: Insufficient documentation

## 2021-12-09 DIAGNOSIS — G4733 Obstructive sleep apnea (adult) (pediatric): Secondary | ICD-10-CM | POA: Diagnosis not present

## 2021-12-09 DIAGNOSIS — I480 Paroxysmal atrial fibrillation: Secondary | ICD-10-CM

## 2021-12-09 DIAGNOSIS — E669 Obesity, unspecified: Secondary | ICD-10-CM | POA: Insufficient documentation

## 2021-12-09 DIAGNOSIS — I4510 Unspecified right bundle-branch block: Secondary | ICD-10-CM | POA: Insufficient documentation

## 2021-12-09 DIAGNOSIS — I10 Essential (primary) hypertension: Secondary | ICD-10-CM | POA: Diagnosis not present

## 2021-12-09 DIAGNOSIS — D6869 Other thrombophilia: Secondary | ICD-10-CM | POA: Insufficient documentation

## 2021-12-09 DIAGNOSIS — I483 Typical atrial flutter: Secondary | ICD-10-CM

## 2021-12-09 DIAGNOSIS — Z7901 Long term (current) use of anticoagulants: Secondary | ICD-10-CM | POA: Insufficient documentation

## 2021-12-09 LAB — MAGNESIUM: Magnesium: 2 mg/dL (ref 1.7–2.4)

## 2021-12-09 NOTE — Progress Notes (Signed)
Primary Care Physician: Sofie Hartigan, MD Primary Cardiologist: Dr Rockey Situ Primary Electrophysiologist: Dr Quentin Ore Referring Physician: Dr Melchor Amour is a 66 y.o. male with a history of CAD s/p non-STEMI 08/2011, atrial fibrillation, atrial flutter, hypertension, hyperlipidemia, DM2, OSA (diagnosed 2018), obesity, RBBB, GERD, who presents for follow up in the [facility].  Patient is on Xarelto for a CHADS2VASC score of 4. He is s/p afib and flutter ablation in 2014 with Dr Rayann Heman. He has had recurrent symptoms of afib and has been to the ED several times in the last couple months. Seen by Dr Quentin Ore who recommended dofetilide admission.   Patient is s/p dofetilide loading 1/10-1/13/22. He initially did well on the medication but started to have more frequent breakthrough episodes and had repeat afib and flutter ablation with Dr Quentin Ore on 11/15/21. Patient reports that he had daily afib two weeks after ablation but the episodes have decreased and he has not had any for several days. No CP, swallowing pain, or groin issues.   Today, he denies symptoms of chest pain, orthopnea, PND, lower extremity edema, dizziness, presyncope, syncope, bleeding, or neurologic sequela. The patient is tolerating medications without difficulties and is otherwise without complaint today.    Atrial Fibrillation Risk Factors:  he does have symptoms or diagnosis of sleep apnea. he is compliant with CPAP therapy. he does not have a history of rheumatic fever.  he has a BMI of Body mass index is 36.96 kg/m.Marland Kitchen Filed Weights   12/09/21 0823  Weight: 120.2 kg    Family History  Problem Relation Age of Onset   Heart attack Maternal Uncle    Heart attack Father 52   Cancer Father        throat   Stroke Mother    Heart disease Mother        pacemaker   Diabetes Son    Heart disease Paternal Grandfather        MI     Atrial Fibrillation Management  history:  Previous antiarrhythmic drugs: amiodarone, dofetilide Previous cardioversions: none Previous ablations: 01/2013 fib and flutter, 11/15/21 CHADS2VASC score: 4 Anticoagulation history: warfarin, Xarelto   Past Medical History:  Diagnosis Date   AKI (acute kidney injury) ([place]) 03/13/2020   Atrial flutter ([place])    Coronary artery disease 08/2011   a. NSTEMI 08/2011, LHC w/ severe disease of LCx with good collaterals, lesion was high-risk for intervention due to a steep angulation of the LCx coming from the left main coronary artery as well as heavy calcifications, discused at cath conference w/ consensus advising med Rx; b. 03/2020 Cath: LM nl, LAD 78m LCX 100p w/ R->L and L->L collats. RCA dominant, nl. EF 55%. PA 33/6/19, PCWP 8.   GERD (gastroesophageal reflux disease)    History of echocardiogram    a. 01/2020 Echo: EF 60-65%, no rwma. Sev elev PASP. Mod dil LA.   Hyperlipidemia    Hypertension    [facility]ocardial infarction ([facility]    Paroxysmal atrial fibrillation ([place]    a. s/p ablation 01/2013; b CHADS2VASc => 3 (HTN, DM, vascular disease); c. on Coumadin; d. 12/2019 Amio d/c'd 2/2 hyperthyroidism.   Poorly controlled diabetes mellitus ([place]    RBBB    Sleep apnea    Past Surgical History:  Procedure Laterality Date   ABLATION  01/29/2013   PVI and CTI by Dr ARayann Hemanfor atrial flutter and paroxysmal atrial fibrillation   ANTERIOR CERVICAL DECOMP/DISCECTOMY FUSION N/A 04/18/2018  Procedure: ANTERIOR CERVICAL DECOMPRESSION/DISCECTOMY FUSION 2 LEVELS C5-7;  Surgeon: Meade Maw, MD;  Location: [facility] ORS;  Service: Neurosurgery;  Laterality: N/A;   ATRIAL FIBRILLATION ABLATION N/A 01/29/2013   Procedure: ATRIAL FIBRILLATION ABLATION;  Surgeon: Coralyn Mark, MD;  Location: [place] CATH LAB;  Service: Cardiovascular;  Laterality: N/A;   ATRIAL FIBRILLATION ABLATION N/A 11/15/2021   Procedure: ATRIAL FIBRILLATION ABLATION;  Surgeon: Vickie Epley, MD;  Location: [place] CV  LAB;  Service: Cardiovascular;  Laterality: N/A;   CARDIAC CATHETERIZATION  08/03/2011   COLONOSCOPY WITH PROPOFOL N/A 02/02/2016   Procedure: COLONOSCOPY WITH PROPOFOL;  Surgeon: Lucilla Lame, MD;  Location: [facility] ENDOSCOPY;  Service: Endoscopy;  Laterality: N/A;   LUMBAR LAMINECTOMY/DECOMPRESSION MICRODISCECTOMY N/A 06/07/2017   Procedure: LUMBAR LAMINECTOMY/DECOMPRESSION MICRODISCECTOMY 2 [id number],G8-3;  Surgeon: Meade Maw, MD;  Location: [facility] ORS;  Service: Neurosurgery;  Laterality: N/A;   RIGHT/LEFT HEART CATH AND CORONARY ANGIOGRAPHY N/A 03/06/2020   Procedure: RIGHT/LEFT HEART CATH AND CORONARY ANGIOGRAPHY;  Surgeon: Minna Merritts, MD;  Location: [place] CV LAB;  Service: Cardiovascular;  Laterality: N/A;   TEE WITHOUT CARDIOVERSION N/A 01/28/2013   Procedure: TRANSESOPHAGEAL ECHOCARDIOGRAM (TEE);  Surgeon: Lelon Perla, MD;  Location: [facility] ENDOSCOPY;  Service: Cardiovascular;  Laterality: N/A;    Current Outpatient Medications  Medication Sig Dispense Refill   acetaminophen (TYLENOL) 500 MG tablet Take 500 mg by mouth 4 (four) times daily as needed for mild pain or moderate pain.     amLODipine (NORVASC) 5 MG tablet TAKE 1 TABLET BY MOUTH ONCE DAILY 90 tablet 0   atorvastatin (LIPITOR) 80 MG tablet Take 80 mg by mouth at bedtime.     benazepril (LOTENSIN) 40 MG tablet TAKE 1 TABLET BY MOUTH ONCE DAILY 90 tablet 0   cholecalciferol (VITAMIN D3) 25 MCG (1000 UNIT) tablet Take 1,000 Units by mouth daily.     Continuous Blood Gluc Sensor (FREESTYLE LIBRE 14 DAY SENSOR) MISC SMARTSIG:1 Kit(s) Topical Every 2 Weeks     dofetilide (TIKOSYN) 500 MCG capsule TAKE 1 CAPSULE BY MOUTH TWICE DAILY 180 capsule 0   ezetimibe (ZETIA) 10 MG tablet TAKE 1 TABLET BY MOUTH ONCE DAILY 90 tablet 0   furosemide (LASIX) 20 MG tablet TAKE 1 TABLET BY MOUTH TWICE DAILY AS NEEDED (Patient taking differently: Take 20 mg by mouth 2 (two) times daily.) 180 tablet 0   insulin glargine (LANTUS) 100  UNIT/ML injection Inject 35 Units into the skin 2 (two) times daily.     insulin lispro (HUMALOG) 100 UNIT/ML KiwkPen Inject 16-22 Units into the skin 2 (two) times daily. Sliding Scale     INSULIN SYRINGE 1CC/29G 29G X 1/2" 1 ML MISC USE AS DIRECTED 100 each PRN   metoprolol succinate (TOPROL XL) 25 MG 24 hr tablet Take 1 tablet (25 mg total) by mouth daily. 90 tablet 3   nitroGLYCERIN (NITROSTAT) 0.4 MG SL tablet DISSOLVE 1 TABLET UNDER THE TONGUE EVERY 5 MINUTES AS NEEDED FOR CHEST PAIN 25 tablet 0   ONE TOUCH ULTRA TEST test strip TEST THREE TIMES A DAY AS DIRECTED. 100 each 12   pantoprazole (PROTONIX) 40 MG tablet Take 1 tablet (40 mg total) by mouth daily. 45 tablet 0   rivaroxaban (XARELTO) 20 MG TABS tablet Take 1 tablet (20 mg total) by mouth daily with supper. 28 tablet 0   sildenafil (REVATIO) 20 MG tablet Take 1 tablet (20 mg total) by mouth 3 (three) times daily as needed. 90 tablet 3   colchicine 0.6  MG tablet Take 1 tablet (0.6 mg total) by mouth 2 (two) times daily for 5 days. 10 tablet 0   No current facility-administered medications for this encounter.   Facility-Administered Medications Ordered in Other Encounters  Medication Dose Route Frequency Provider Last Rate Last Admin   sodium chloride flush (NS) 0.9 % injection 3 mL  3 mL Intravenous Q12H Visser, Jacquelyn D, PA-C        No Known Allergies  Social History   Socioeconomic History   Marital status: Married    Spouse name: Not on file   Number of children: Not on file   Years of education: Not on file   Highest education level: Not on file  Occupational History   Not on file  Tobacco Use   Smoking status: Former    Packs/day: 1.50    Years: 35.00    Total pack years: 52.50    Types: Cigarettes    Quit date: 01/16/2008    Years since quitting: 13.9   Smokeless tobacco: Never   Tobacco comments:    Former smoker 12/09/21  Vaping Use   Vaping Use: Never used  Substance and Sexual Activity   Alcohol  use: Not Currently    Comment: occasional   Drug use: No   Sexual activity: Yes  Other Topics Concern   Not on file  Social History Narrative   Pt lives in [place] (right outside of [place])   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  Physical Exam: Vitals:   12/09/21 0823  BP: (!) 152/74  Pulse: (!) 50  Weight: 120.2 kg  Height: '5\' 11"'  (1.803 m)     GEN- The patient is a well appearing obese male, alert and oriented x 3 today.   HEENT-head normocephalic, atraumatic, sclera clear, conjunctiva pink, hearing intact, trachea midline. Lungs- Clear to ausculation bilaterally, normal work of breathing Heart- Regular rate and rhythm, occasional ectopic beat, no murmurs, rubs or gallops  GI- soft, NT, ND, + BS Extremities- no clubbing, cyanosis, or edema MS- no significant deformity or atrophy Skin- no rash or lesion Psych- euthymic mood, full affect Neuro- strength and sensation are intact   Wt Readings from Last 3 Encounters:  12/09/21 120.2 kg  11/15/21 117.9 kg  07/14/21 117.5 kg    EKG today demonstrates  SB, PACs, RBBB Vent. rate 50 BPM PR interval 158 ms QRS duration 140 ms QT/QTcB 498/454 ms  Echo 08/03/21 demonstrated   1. Left ventricular ejection fraction, by estimation, is 60 to 65%. The  left ventricle has normal function. The left ventricle has no regional  wall motion abnormalities. Left ventricular diastolic parameters are  consistent with Grade II diastolic dysfunction (pseudonormalization).   2. Right ventricular systolic function is normal. The right ventricular  size is normal. There  is mildly elevated pulmonary artery systolic  pressure. The estimated right ventricular systolic pressure is 50.3 mmHg.   3. Left atrial size was moderately dilated.   4. The mitral valve is normal in structure. No evidence of mitral valve  regurgitation. No evidence of mitral stenosis. Moderate  mitral annular  calcification.   5. The aortic valve was not well visualized. Aortic valve regurgitation  is not visualized. No aortic stenosis is present.   6. The inferior vena cava is normal in size with greater than 50%  respiratory variability, suggesting right atrial pressure of 3 mmHg.   Epic records are reviewed at length today  CHA2DS2-VASc Score = 4  The patient's score is based upon: CHF History: 0 HTN History: 1 Diabetes History: 1 Stroke History: 0 Vascular Disease History: 1 Age Score: 1 Gender Score: 0        ASSESSMENT AND PLAN: 1. Paroxysmal Atrial Fibrillation/atrial flutter The patient's CHA2DS2-VASc score is 4, indicating a 4.8% annual risk of stroke.   S/p dofetilide loading 1/10-1/13/22 S/p afib and flutter ablation 11/15/21 Patient in SR today, afib episodes decreasing in frequency.  Continue dofetilide 500 mcg BID. QT stable. Continue Xarelto 20 mg daily Check magnesium today, recent Cmet reviewed.   2. Secondary Hypercoagulable State (ICD10:  D68.69) The patient is at significant risk for stroke/thromboembolism based upon his CHA2DS2-VASc Score of 4.  Continue Rivaroxaban (Xarelto).   3. Obesity Body mass index is 36.96 kg/m. Lifestyle modification was discussed and encouraged including regular physical activity and weight reduction.  4. Obstructive sleep apnea Encouraged compliance with CPAP therapy.  5. CAD Stable, no anginal symptoms.  6. HTN Mildly elevated today patient to check BP at home between now and his visit with his PCP in two weeks. If it remains consistently elevated, could consider increasing amlodipine.     Follow up with Dr Rockey Situ  as scheduled and with Dr Quentin Ore per recall.    [facility] [address] [place], [place] [address] [phone] 12/09/2021 8:32 AM

## 2021-12-29 ENCOUNTER — Other Ambulatory Visit: Payer: Self-pay | Admitting: Cardiology

## 2022-01-07 ENCOUNTER — Other Ambulatory Visit: Payer: Self-pay | Admitting: Cardiovascular Disease

## 2022-01-11 NOTE — Progress Notes (Unsigned)
Cardiology Office Note  Date:  01/12/2022   ID:  Billey, Wojciak 1955-10-05, MRN [id number]  PCP:  Sofie Hartigan, MD   Chief Complaint  Patient presents with   Follow-up    12 month F/U-No new cardiac concerns     HPI:  Mr. Vest is a pleasant 66 -year-old gentleman with  CAD,  non-STEMI with admission to the hospital in late April 2012  cardiac catheterization showing severe mid left circumflex disease, subtotally occluded, with collaterals from the RCA to the Left circumflex,  normal ejection fraction  sleep apnea on CPAP episodes of atrial flutter. He reports symptoms dating back into his 86s.  hospitalizations for atrial flutter, 08/11/2012 and on 01/11/2013. medication for cardioversion.  several episodes that broke on their own ablation with Dr. Rayann Heman,  for typical atrial flutter, atrial fibrillation. 2014 03/2020: atrial fib, Tikosyn loading,  Atrial fibrillation ablation August 2023 CHA2DS2-VASc Score of 4. 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 Rayann Heman,  for typical atrial flutter, atrial fibrillation.   Prior Echocardiogram shows normal LV systolic function, essentially normal study, May 2013   PMH:   has a past medical history of AKI (acute kidney injury) ([place]) (03/13/2020), Atrial flutter ([place]), Coronary artery disease (08/2011), GERD (gastroesophageal reflux disease), History of echocardiogram, Hyperlipidemia, Hypertension, Myocardial infarction [facility]), Paroxysmal atrial fibrillation ([place]), Poorly controlled diabetes mellitus ([place]), RBBB, and Sleep apnea.  PSH:    Past Surgical History:  Procedure Laterality Date   ABLATION  01/29/2013   PVI and CTI by Dr Rayann Heman for atrial flutter and paroxysmal atrial fibrillation   ANTERIOR CERVICAL DECOMP/DISCECTOMY FUSION N/A 04/18/2018   Procedure: ANTERIOR CERVICAL DECOMPRESSION/DISCECTOMY FUSION 2 LEVELS C5-7;  Surgeon: Meade Maw, MD;  Location: [facility] ORS;  Service: Neurosurgery;  Laterality: N/A;   ATRIAL FIBRILLATION ABLATION N/A 01/29/2013   Procedure: ATRIAL FIBRILLATION ABLATION;  Surgeon: Coralyn Mark, MD;  Location: [place] CATH LAB;  Service: Cardiovascular;  Laterality: N/A;   ATRIAL FIBRILLATION ABLATION N/A 11/15/2021   Procedure: ATRIAL FIBRILLATION ABLATION;  Surgeon: Vickie Epley, MD;  Location: [place] CV LAB;  Service: Cardiovascular;  Laterality: N/A;   CARDIAC CATHETERIZATION  08/03/2011   COLONOSCOPY WITH PROPOFOL N/A 02/02/2016   Procedure: COLONOSCOPY WITH PROPOFOL;  Surgeon: Lucilla Lame, MD;  Location: [facility] ENDOSCOPY;  Service: Endoscopy;  Laterality: N/A;   LUMBAR LAMINECTOMY/DECOMPRESSION MICRODISCECTOMY N/A 06/07/2017   Procedure: LUMBAR LAMINECTOMY/DECOMPRESSION MICRODISCECTOMY 2 TJQZES-P2-3,R0-0;  Surgeon: Meade Maw, MD;  Location: [facility] ORS;  Service: Neurosurgery;  Laterality: N/A;    RIGHT/LEFT HEART CATH AND CORONARY ANGIOGRAPHY N/A 03/06/2020   Procedure: RIGHT/LEFT HEART CATH AND CORONARY ANGIOGRAPHY;  Surgeon: Minna Merritts, MD;  Location: [place] CV LAB;  Service: Cardiovascular;  Laterality: N/A;   TEE WITHOUT CARDIOVERSION N/A 01/28/2013   Procedure: TRANSESOPHAGEAL ECHOCARDIOGRAM (TEE);  Surgeon: Lelon Perla, MD;  Location: [facility] ENDOSCOPY;  Service: Cardiovascular;  Laterality: N/A;   Current Outpatient Medications on File Prior to Visit  Medication Sig Dispense Refill   acetaminophen (TYLENOL) 500 MG tablet Take 500 mg by mouth 4 (four) times daily as needed for mild pain or moderate pain.     amLODipine (NORVASC) 5 MG tablet TAKE 1 TABLET BY MOUTH ONCE DAILY 90 tablet 0   atorvastatin (LIPITOR) 80 MG tablet Take 80 mg by mouth at bedtime.     benazepril (LOTENSIN) 40 MG tablet TAKE 1 TABLET BY MOUTH ONCE DAILY 90 tablet 0   cholecalciferol (VITAMIN D3) 25 MCG (1000 UNIT) tablet Take 1,000 Units by mouth daily.     Continuous Blood Gluc Sensor (FREESTYLE LIBRE 14 DAY SENSOR) MISC SMARTSIG:1 Kit(s) Topical Every 2 Weeks     dofetilide (TIKOSYN) 500 MCG capsule TAKE 1 CAPSULE BY MOUTH TWICE DAILY 180 capsule 0   ezetimibe (ZETIA) 10 MG tablet TAKE 1 TABLET BY MOUTH ONCE DAILY 90 tablet 0   furosemide (LASIX) 20 MG tablet TAKE 1 TABLET BY MOUTH TWICE DAILY AS NEEDED 180 tablet 0   insulin glargine (LANTUS) 100 UNIT/ML injection Inject 35 Units into the skin 2 (two) times daily.     insulin lispro (HUMALOG) 100 UNIT/ML KiwkPen Inject 16-22 Units into the skin 2 (two) times daily. Sliding Scale     INSULIN SYRINGE 1CC/29G 29G X 1/2" 1 ML MISC USE AS DIRECTED 100 each PRN   metoprolol succinate (TOPROL XL) 25 MG 24 hr tablet Take 1 tablet (25 mg total) by mouth daily. 90 tablet 3   nitroGLYCERIN (NITROSTAT) 0.4 MG SL tablet DISSOLVE 1 TABLET UNDER THE TONGUE EVERY 5 MINUTES AS NEEDED FOR CHEST PAIN 25 tablet 0   ONE TOUCH ULTRA TEST test strip TEST THREE TIMES  A DAY AS DIRECTED. 100 each 12   rivaroxaban (XARELTO) 20 MG TABS tablet Take 1 tablet (20 mg total) by mouth daily with supper. 28 tablet 0   sildenafil (REVATIO) 20 MG tablet Take 1 tablet (20 mg total) by mouth 3 (three) times daily as needed. 90 tablet 3   Current Facility-Administered Medications on File Prior to Visit  Medication Dose Route Frequency Provider Last Rate Last Admin   sodium chloride flush (NS) 0.9 % injection 3 mL  3 mL Intravenous Q12H Visser, Jacquelyn D, PA-C         Allergies:   Patient has no known allergies.   Social History:  The patient  reports that he quit smoking about 14 years ago. His smoking use included cigarettes. He has a 52.50 pack-year smoking history. He has never used smokeless tobacco. He reports that he does not currently use alcohol. He reports that he does not use drugs.   Family History:   family history includes Cancer in his father; Diabetes in his son; Heart attack in his maternal uncle; Heart attack (age of onset: 82) in his father; Heart disease in his mother and paternal grandfather; Stroke in his mother.    Review of Systems: Review of Systems  Constitutional: Negative.   HENT: Negative.    Respiratory: Negative.    Cardiovascular:  Negative.   Gastrointestinal: Negative.   Musculoskeletal: Negative.   Neurological: Negative.   Psychiatric/Behavioral: Negative.    All other systems reviewed and are negative.   PHYSICAL EXAM: VS:  BP 136/66 (BP Location: Left Arm, Patient Position: Sitting, Cuff Size: Large)   Pulse (!) 52   Ht 5' 11"  (1.803 m)   Wt 260 lb (117.9 kg)   SpO2 96%   BMI 36.26 kg/m  , BMI Body mass index is 36.26 kg/m. Constitutional:  oriented to person, place, and time. No distress.  HENT:  Head: Grossly normal Eyes:  no discharge. No scleral icterus.  Neck: No JVD, no carotid bruits  Cardiovascular: Regular rate and rhythm, no murmurs appreciated Pulmonary/Chest: Clear to auscultation bilaterally, no  wheezes or rails Abdominal: Soft.  no distension.  no tenderness.  Musculoskeletal: Normal range of motion Neurological:  normal muscle tone. Coordination normal. No atrophy Skin: Skin warm and dry Psychiatric: normal affect, pleasant  Recent Labs: 10/25/2021: BUN 26; Creatinine, Ser 1.26; Hemoglobin 12.8; Platelets 217; Potassium 4.3; Sodium 137 12/09/2021: Magnesium 2.0    Lipid Panel Lab Results  Component Value Date   CHOL 162 01/13/2015   HDL 47 01/12/2013   LDLCALC 49 01/12/2013   TRIG 62 01/13/2015    Wt Readings from Last 3 Encounters:  01/12/22 260 lb (117.9 kg)  12/09/21 265 lb (120.2 kg)  11/15/21 260 lb (117.9 kg)     ASSESSMENT AND PLAN:  Paroxysmal atrial fibrillation (HCC)  Underwent recent ablation August 2023, successful Completed 1 week colchicine, denies any breakthrough tachypalpitations compliant with his xarelto On tykosin/metoprolol maintaining NSR  Coronary artery disease involving native coronary artery of native heart without angina pectoris Currently with no symptoms of angina. No further workup at this time. Continue current medication regimen. Stressed importance of better diabetes control, through weight loss and exercise  Essential hypertension Blood pressure is well controlled on today's visit. No changes made to the medications.  Mixed hyperlipidemia Cholesterol is at goal on the current lipid regimen. No changes to the medications were made.  Type 2 diabetes mellitus with other circulatory complication, with long-term current use of insulin (HCC) Long history of poorly controlled diabetes Stressed importance of working with endocrinology  Discussed increasing exercise, change in diet to get A1c in the 7 range  Total encounter time more than 25 minutes Greater than 50% was spent in counseling and coordination of care with the patient   Orders Placed This Encounter  Procedures   EKG 12-Lead     Signed, Esmond Plants, M.D.,  Ph.D. 01/12/2022  [place], [place]

## 2022-01-12 ENCOUNTER — Ambulatory Visit: Payer: Medicare Other | Attending: Cardiovascular Disease | Admitting: Cardiovascular Disease

## 2022-01-12 ENCOUNTER — Encounter: Payer: Self-pay | Admitting: Cardiovascular Disease

## 2022-01-12 VITALS — BP 136/66 | HR 52 | Ht 71.0 in | Wt 260.0 lb

## 2022-01-12 DIAGNOSIS — D6869 Other thrombophilia: Secondary | ICD-10-CM | POA: Diagnosis not present

## 2022-01-12 DIAGNOSIS — I480 Paroxysmal atrial fibrillation: Secondary | ICD-10-CM

## 2022-01-12 DIAGNOSIS — G4733 Obstructive sleep apnea (adult) (pediatric): Secondary | ICD-10-CM

## 2022-01-12 DIAGNOSIS — I483 Typical atrial flutter: Secondary | ICD-10-CM

## 2022-01-12 DIAGNOSIS — I10 Essential (primary) hypertension: Secondary | ICD-10-CM | POA: Diagnosis not present

## 2022-01-12 DIAGNOSIS — I25118 Atherosclerotic heart disease of native coronary artery with other forms of angina pectoris: Secondary | ICD-10-CM

## 2022-01-12 DIAGNOSIS — E782 Mixed hyperlipidemia: Secondary | ICD-10-CM

## 2022-01-12 DIAGNOSIS — E0590 Thyrotoxicosis, unspecified without thyrotoxic crisis or storm: Secondary | ICD-10-CM

## 2022-01-12 NOTE — Patient Instructions (Addendum)
Medication Instructions:  No changes  If you need a refill on your cardiac medications before your next appointment, please call your pharmacy.    Lab work: No new labs needed   Testing/Procedures: No new testing needed   Follow-Up: At [facility], you and your health needs are our priority.  As part of our continuing mission to provide you with exceptional heart care, we have created designated Provider Care Teams.  These Care Teams include your primary Cardiologist (physician) and Advanced Practice Providers (APPs -  Physician Assistants and Nurse Practitioners) who all work together to provide you with the care you need, when you need it.  You will need a follow up appointment in 12 months  Providers on your designated Care Team:   Murray Hodgkins, NP Christell Faith, PA-C Cadence Kathlen Mody, [place]  COVID-19 Vaccine Information can be found at: [email or web address] For questions related to vaccine distribution or appointments, please email [email or web address] or call 4234363648.    Diabetes Mellitus and Nutrition, Adult When you have diabetes, or diabetes mellitus, it is very important to have healthy eating habits because your blood sugar (glucose) levels are greatly affected by what you eat and drink. Eating healthy foods in the right amounts, at about the same times every day, can help you: Manage your blood glucose. Lower your risk of heart disease. Improve your blood pressure. Reach or maintain a healthy weight. What can affect my meal plan? Every person with diabetes is different, and each person has different needs for a meal plan. Your health care provider may recommend that you work with a dietitian to make a meal plan that is best for you. Your meal plan may vary depending on factors such as: The calories you need. The medicines you take. Your weight. Your blood glucose, blood pressure, and cholesterol  levels. Your activity level. Other health conditions you have, such as heart or kidney disease. How do carbohydrates affect me? Carbohydrates, also called carbs, affect your blood glucose level more than any other type of food. Eating carbs raises the amount of glucose in your blood. It is important to know how many carbs you can safely have in each meal. This is different for every person. Your dietitian can help you calculate how many carbs you should have at each meal and for each snack. How does alcohol affect me? Alcohol can cause a decrease in blood glucose (hypoglycemia), especially if you use insulin or take certain diabetes medicines by mouth. Hypoglycemia can be a life-threatening condition. Symptoms of hypoglycemia, such as sleepiness, dizziness, and confusion, are similar to symptoms of having too much alcohol. Do not drink alcohol if: Your health care provider tells you not to drink. You are pregnant, may be pregnant, or are planning to become pregnant. If you drink alcohol: Limit how much you have to: 0-1 drink a day for women. 0-2 drinks a day for men. Know how much alcohol is in your drink. In the [place] one drink equals one 12 oz bottle of beer (355 mL), one 5 oz glass of wine (148 mL), or one 1 oz glass of hard liquor (44 mL). Keep yourself hydrated with water, diet soda, or unsweetened iced tea. Keep in mind that regular soda, juice, and other mixers may contain a lot of sugar and must be counted as carbs. What are tips for following this plan?  Reading food labels Start by checking the serving size on the Nutrition Facts label of packaged foods and drinks. The number of calories and  the amount of carbs, fats, and other nutrients listed on the label are based on one serving of the item. Many items contain more than one serving per package. Check the total grams (g) of carbs in one serving. Check the number of grams of saturated fats and trans fats in one serving. Choose foods  that have a low amount or none of these fats. Check the number of milligrams (mg) of salt (sodium) in one serving. Most people should limit total sodium intake to less than 2,300 mg per day. Always check the nutrition information of foods labeled as "low-fat" or "nonfat." These foods may be higher in added sugar or refined carbs and should be avoided. Talk to your dietitian to identify your daily goals for nutrients listed on the label. Shopping Avoid buying canned, pre-made, or processed foods. These foods tend to be high in fat, sodium, and added sugar. Shop around the outside edge of the grocery store. This is where you will most often find fresh fruits and vegetables, bulk grains, fresh meats, and fresh dairy products. Cooking Use low-heat cooking methods, such as baking, instead of high-heat cooking methods, such as deep frying. Cook using healthy oils, such as olive, canola, or sunflower oil. Avoid cooking with butter, cream, or high-fat meats. Meal planning Eat meals and snacks regularly, preferably at the same times every day. Avoid going long periods of time without eating. Eat foods that are high in fiber, such as fresh fruits, vegetables, beans, and whole grains. Eat 4-6 oz (112-168 g) of lean protein each day, such as lean meat, chicken, fish, eggs, or tofu. One ounce (oz) (28 g) of lean protein is equal to: 1 oz (28 g) of meat, chicken, or fish. 1 egg.  cup (62 g) of tofu. Eat some foods each day that contain healthy fats, such as avocado, nuts, seeds, and fish. What foods should I eat? Fruits Berries. Apples. Oranges. Peaches. Apricots. Plums. Grapes. Mangoes. Papayas. Pomegranates. Kiwi. Cherries. Vegetables Leafy greens, including lettuce, spinach, kale, chard, collard greens, mustard greens, and cabbage. Beets. Cauliflower. Broccoli. Carrots. Green beans. Tomatoes. Peppers. Onions. Cucumbers. Brussels sprouts. Grains Whole grains, such as whole-wheat or whole-grain bread,  crackers, tortillas, cereal, and pasta. Unsweetened oatmeal. Quinoa. Brown or wild rice. Meats and other proteins Seafood. Poultry without skin. Lean cuts of poultry and beef. Tofu. Nuts. Seeds. Dairy Low-fat or fat-free dairy products such as milk, yogurt, and cheese. The items listed above may not be a complete list of foods and beverages you can eat and drink. Contact a dietitian for more information. What foods should I avoid? Fruits Fruits canned with syrup. Vegetables Canned vegetables. Frozen vegetables with butter or cream sauce. Grains Refined white flour and flour products such as bread, pasta, snack foods, and cereals. Avoid all processed foods. Meats and other proteins Fatty cuts of meat. Poultry with skin. Breaded or fried meats. Processed meat. Avoid saturated fats. Dairy Full-fat yogurt, cheese, or milk. Beverages Sweetened drinks, such as soda or iced tea. The items listed above may not be a complete list of foods and beverages you should avoid. Contact a dietitian for more information. Questions to ask a health care provider Do I need to meet with a certified diabetes care and education specialist? Do I need to meet with a dietitian? What number can I call if I have questions? When are the best times to check my blood glucose? Where to find more information: American Diabetes Association: diabetes.org Academy of Nutrition and Dietetics: eatright.org National  Institute of Diabetes and Digestive and Kidney Diseases: AmenCredit.is Association of Diabetes Care & Education Specialists: diabeteseducator.org Summary It is important to have healthy eating habits because your blood sugar (glucose) levels are greatly affected by what you eat and drink. It is important to use alcohol carefully. A healthy meal plan will help you manage your blood glucose and lower your risk of heart disease. Your health care provider may recommend that you work with a dietitian to make a meal  plan that is best for you. This information is not intended to replace advice given to you by your health care provider. Make sure you discuss any questions you have with your health care provider. Document Revised: 10/23/2019 Document Reviewed: 10/23/2019 Elsevier Patient Education  [place].

## 2022-01-20 ENCOUNTER — Other Ambulatory Visit: Payer: Self-pay | Admitting: Cardiovascular Disease

## 2022-01-27 ENCOUNTER — Ambulatory Visit: Payer: Medicare Other | Admitting: Pulmonary Disease

## 2022-01-27 ENCOUNTER — Encounter: Payer: Self-pay | Admitting: Pulmonary Disease

## 2022-01-27 VITALS — BP 124/70 | HR 61 | Temp 97.8°F | Ht 71.0 in | Wt 265.0 lb

## 2022-01-27 DIAGNOSIS — G4733 Obstructive sleep apnea (adult) (pediatric): Secondary | ICD-10-CM | POA: Diagnosis not present

## 2022-01-27 NOTE — Progress Notes (Signed)
[facility]  Chief Complaint  Patient presents with   Follow-up    Wearing cpap avg 6-7hr nightly- pressure and mask is okay.     Constitutional:  BP 124/70 (BP Location: Left Arm, Cuff Size: Normal)   Pulse 61   Temp 97.8 F (36.6 C) (Temporal)   Ht 5' 11" (1.803 m)   Wt 265 lb (120.2 kg)   SpO2 99%   BMI 36.96 kg/m   Past Medical History:  A fib, DM type 2, GERD, CAD, ED, HTN, HLD  Past Surgical History:  He  has a past surgical history that includes Cardiac catheterization (08/03/2011); TEE without cardioversion (N/A, 01/28/2013); Ablation (01/29/2013); atrial fibrillation ablation (N/A, 01/29/2013); Colonoscopy with propofol (N/A, 02/02/2016); Lumbar laminectomy/decompression microdiscectomy (N/A, 06/07/2017); Anterior cervical decomp/discectomy fusion (N/A, 04/18/2018); RIGHT/LEFT HEART CATH AND CORONARY ANGIOGRAPHY (N/A, 03/06/2020); and ATRIAL FIBRILLATION ABLATION (N/A, 11/15/2021).  Brief Summary:  Douglas Hunter is a 66 y.o. male with obstructive sleep apnea.      Subjective:   He is sleeping well.  Uses CPAP nightly with nasal pillow mask.  Pressure setting is comfortable.  Not having sinus congestion, dry mouth, or sore throat.  He had ablation over the Summer.  Physical Exam:   Appearance - well kempt   ENMT - no sinus tenderness, no oral exudate, no LAN, Mallampati 3 airway, no stridor  Respiratory - equal breath sounds bilaterally, no wheezing or rales  CV - s1s2 regular rate and rhythm, no murmurs  Ext - no clubbing, no edema  Skin - no rashes  Psych - normal mood and affect    Sleep Tests:  HST 12/08/16 >> AHI 30, SpO2 low 81% Auto CPAP 12/26/21 to 01/24/22 >> used on 29 of 30 nights with average 5 hrs 48 min.  Average AHI 2.6 with CPAP 14 cm H2O  Cardiac Tests:  RHC 03/06/20 >> PAOP 8, PA 33/6/19, RV 37/1/6, CO 5.14, CI 2.2 Echo 08/03/21 >> 60 to 65%, grade 2 DD, RVSP 38.9 mmHg, mod LA  dilation  Social History:  He  reports that he quit smoking about 14 years ago. His smoking use included cigarettes. He has a 52.50 pack-year smoking history. He has never used smokeless tobacco. He reports that he does not currently use alcohol. He reports that he does not use drugs.  Family History:  His family history includes Cancer in his father; Diabetes in his son; Heart attack in his maternal uncle; Heart attack (age of onset: 43) in his father; Heart disease in his mother and paternal grandfather; Stroke in his mother.     Assessment/Plan:   Obstructive sleep apnea. - he is compliant with CPAP and reports benefit - he uses [employer] for his DME - his current CPAP is more than 67 yrs old - will arrange for new Resmed CPAP at 14 cm H2O  Paroxysmal atrial fibrillation/atrial flutter, Coronary artery disease. - followed by Dr. Lars Mage and Dr. [name] with cardiology  Time Spent Involved in Patient Care on Day of Examination:  25 minutes  Follow up:   Patient Instructions  Will have [employer] arrange for a new CPAP machine  Follow up in 4 months  Medication List:   Allergies as of 01/27/2022   No Known Allergies      Medication List        Accurate as of January 27, 2022  8:57 AM. If you have any questions, ask your nurse or doctor.  acetaminophen 500 MG tablet Commonly known as: TYLENOL Take 500 mg by mouth 4 (four) times daily as needed for mild pain or moderate pain.   amLODipine 5 MG tablet Commonly known as: NORVASC TAKE 1 TABLET BY MOUTH ONCE DAILY   atorvastatin 80 MG tablet Commonly known as: LIPITOR Take 80 mg by mouth at bedtime.   benazepril 40 MG tablet Commonly known as: LOTENSIN TAKE 1 TABLET BY MOUTH ONCE DAILY   cholecalciferol 25 MCG (1000 UNIT) tablet Commonly known as: VITAMIN D3 Take 1,000 Units by mouth daily.   dofetilide 500 MCG capsule Commonly known as: TIKOSYN TAKE 1 CAPSULE BY MOUTH TWICE DAILY    ezetimibe 10 MG tablet Commonly known as: ZETIA TAKE 1 TABLET BY MOUTH ONCE DAILY   FreeStyle Libre 14 Day Sensor Misc SMARTSIG:1 Kit(s) Topical Every 2 Weeks   furosemide 20 MG tablet Commonly known as: LASIX TAKE 1 TABLET BY MOUTH TWICE DAILY AS NEEDED   insulin glargine 100 UNIT/ML injection Commonly known as: LANTUS Inject 35 Units into the skin 2 (two) times daily.   insulin lispro 100 UNIT/ML KiwkPen Commonly known as: HUMALOG Inject 16-22 Units into the skin 2 (two) times daily. Sliding Scale   INSULIN SYRINGE 1CC/29G 29G X 1/2" 1 ML Misc USE AS DIRECTED   metoprolol succinate 25 MG 24 hr tablet Commonly known as: Toprol XL Take 1 tablet (25 mg total) by mouth daily.   nitroGLYCERIN 0.4 MG SL tablet Commonly known as: NITROSTAT DISSOLVE 1 TABLET UNDER THE TONGUE EVERY 5 MINUTES AS NEEDED FOR CHEST PAIN   ONE TOUCH ULTRA TEST test strip Generic drug: glucose blood TEST THREE TIMES A DAY AS DIRECTED.   rivaroxaban 20 MG Tabs tablet Commonly known as: Xarelto Take 1 tablet (20 mg total) by mouth daily with supper.   sildenafil 20 MG tablet Commonly known as: REVATIO Take 1 tablet (20 mg total) by mouth 3 (three) times daily as needed.        Signature:  Chesley Mires, MD [place] Pager - [phone] 01/27/2022, 8:57 AM

## 2022-01-27 NOTE — Patient Instructions (Signed)
Will have [employer] arrange for a new CPAP machine  Follow up in 4 months

## 2022-02-16 ENCOUNTER — Other Ambulatory Visit
Admission: RE | Admit: 2022-02-16 | Discharge: 2022-02-16 | Disposition: A | Discharge status: Home / Self Care | Payer: Medicare Other | Source: Ambulatory Visit | Attending: Cardiology | Admitting: Cardiology

## 2022-02-16 ENCOUNTER — Ambulatory Visit: Payer: Medicare Other | Attending: Cardiology | Admitting: Cardiology

## 2022-02-16 VITALS — BP 154/70 | HR 60 | Ht 71.0 in | Wt 262.0 lb

## 2022-02-16 DIAGNOSIS — I10 Essential (primary) hypertension: Secondary | ICD-10-CM

## 2022-02-16 DIAGNOSIS — I483 Typical atrial flutter: Secondary | ICD-10-CM | POA: Insufficient documentation

## 2022-02-16 DIAGNOSIS — I4819 Other persistent atrial fibrillation: Secondary | ICD-10-CM

## 2022-02-16 DIAGNOSIS — Z79899 Other long term (current) drug therapy: Secondary | ICD-10-CM | POA: Diagnosis not present

## 2022-02-16 LAB — BASIC METABOLIC PANEL
Anion gap: 9 (ref 5–15)
BUN: 28 mg/dL — ABNORMAL HIGH (ref 8–23)
CO2: 20 mmol/L — ABNORMAL LOW (ref 22–32)
Calcium: 9 mg/dL (ref 8.9–10.3)
Chloride: 108 mmol/L (ref 98–111)
Creatinine, Ser: 1.32 mg/dL — ABNORMAL HIGH (ref 0.61–1.24)
GFR, Estimated: 59 mL/min — ABNORMAL LOW (ref >60)
Glucose, Bld: 278 mg/dL — ABNORMAL HIGH (ref 70–99)
Potassium: 4.8 mmol/L (ref 3.5–5.1)
Sodium: 137 mmol/L (ref 135–145)

## 2022-02-16 LAB — MAGNESIUM: Magnesium: 2 mg/dL (ref 1.7–2.4)

## 2022-02-16 NOTE — Progress Notes (Signed)
Electrophysiology Office Follow up Visit Note:    Date:  02/16/2022   ID:  Douglas Hunter, DOB 1955-08-17, MRN [id number]  PCP:  Sofie Hartigan, MD  [facility] Cardiologist:  [name], MD  [facility] Electrophysiologist:  Vickie Epley, MD    Interval History:    Douglas Hunter is a 66 y.o. male who presents for a follow up visit. He had an AF ablation 11/15/2021.  No breakthrough AF at his appt w Dr Rockey Situ 01/12/2022.     Past Medical History:  Diagnosis Date   AKI (acute kidney injury) ([place]) 03/13/2020   Atrial flutter ([place])    Coronary artery disease 08/2011   a. NSTEMI 08/2011, LHC w/ severe disease of LCx with good collaterals, lesion was high-risk for intervention due to a steep angulation of the LCx coming from the left main coronary artery as well as heavy calcifications, discused at cath conference w/ consensus advising med Rx; b. 03/2020 Cath: LM nl, LAD 42m LCX 100p w/ R->L and L->L collats. RCA dominant, nl. EF 55%. PA 33/6/19, PCWP 8.   GERD (gastroesophageal reflux disease)    History of echocardiogram    a. 01/2020 Echo: EF 60-65%, no rwma. Sev elev PASP. Mod dil LA.   Hyperlipidemia    Hypertension    Myocardial infarction ([facility]    [place]    a. s/p ablation 01/2013; b CHADS2VASc => 3 (HTN, DM, vascular disease); c. on Coumadin; d. 12/2019 Amio d/c'd 2/2 hyperthyroidism.   Poorly controlled diabetes mellitus ([place]    RBBB    Sleep apnea     Past Surgical History:  Procedure Laterality Date   ABLATION  01/29/2013   PVI and CTI by Dr ARayann Hemanfor atrial flutter and paroxysmal atrial fibrillation   ANTERIOR CERVICAL DECOMP/DISCECTOMY FUSION N/A 04/18/2018   Procedure: ANTERIOR CERVICAL DECOMPRESSION/DISCECTOMY FUSION 2 LEVELS C5-7;  Surgeon: YMeade Maw MD;  Location: [facility] ORS;  Service: Neurosurgery;  Laterality: N/A;   ATRIAL FIBRILLATION ABLATION N/A 01/29/2013   Procedure: ATRIAL FIBRILLATION  ABLATION;  Surgeon: JCoralyn Mark MD;  [facility];  Service: Cardiovascular;  Laterality: N/A;   ATRIAL FIBRILLATION ABLATION N/A 11/15/2021   Procedure: ATRIAL FIBRILLATION ABLATION;  Surgeon: LVickie Epley MD;  [facility];  Service: Cardiovascular;  Laterality: N/A;   CARDIAC CATHETERIZATION  08/03/2011   COLONOSCOPY WITH PROPOFOL N/A 02/02/2016   Procedure: COLONOSCOPY WITH PROPOFOL;  Surgeon: DLucilla Lame MD;  Location: [facility] ENDOSCOPY;  Service: Endoscopy;  Laterality: N/A;   LUMBAR LAMINECTOMY/DECOMPRESSION MICRODISCECTOMY N/A 06/07/2017   Procedure: LUMBAR LAMINECTOMY/DECOMPRESSION MICRODISCECTOMY 2 [id number],Y8-6  Surgeon: YMeade Maw MD;  Location: [facility] ORS;  Service: Neurosurgery;  Laterality: N/A;   RIGHT/LEFT HEART CATH AND CORONARY ANGIOGRAPHY N/A 03/06/2020   Procedure: RIGHT/LEFT HEART CATH AND CORONARY ANGIOGRAPHY;  Surgeon: GMinna Merritts MD;  [facility];  Service: Cardiovascular;  Laterality: N/A;   TEE WITHOUT CARDIOVERSION N/A 01/28/2013   Procedure: TRANSESOPHAGEAL ECHOCARDIOGRAM (TEE);  Surgeon: BLelon Perla MD;  [facility];  Service: Cardiovascular;  Laterality: N/A;    Current Medications: Current Meds  Medication Sig   acetaminophen (TYLENOL) 500 MG tablet Take 500 mg by mouth 4 (four) times daily as needed for mild pain or moderate pain.   amLODipine (NORVASC) 5 MG tablet TAKE 1 TABLET BY MOUTH ONCE DAILY   atorvastatin (LIPITOR) 80 MG tablet Take 80 mg by mouth at bedtime.   benazepril (LOTENSIN) 40 MG  tablet TAKE 1 TABLET BY MOUTH ONCE DAILY   cholecalciferol (VITAMIN D3) 25 MCG (1000 UNIT) tablet Take 1,000 Units by mouth daily.   Continuous Blood Gluc Sensor (FREESTYLE LIBRE 14 DAY SENSOR) MISC SMARTSIG:1 Kit(s) Topical Every 2 Weeks   dofetilide (TIKOSYN) 500 MCG capsule TAKE 1 CAPSULE BY MOUTH TWICE DAILY   ezetimibe (ZETIA) 10 MG tablet TAKE 1 TABLET BY MOUTH ONCE DAILY    furosemide (LASIX) 20 MG tablet TAKE 1 TABLET BY MOUTH TWICE DAILY AS NEEDED   insulin glargine (LANTUS) 100 UNIT/ML injection Inject 35 Units into the skin 2 (two) times daily.   insulin lispro (HUMALOG) 100 UNIT/ML KiwkPen Inject 16-22 Units into the skin 2 (two) times daily. Sliding Scale   INSULIN SYRINGE 1CC/29G 29G X 1/2" 1 ML MISC USE AS DIRECTED   metoprolol succinate (TOPROL XL) 25 MG 24 hr tablet Take 1 tablet (25 mg total) by mouth daily.   nitroGLYCERIN (NITROSTAT) 0.4 MG SL tablet DISSOLVE 1 TABLET UNDER THE TONGUE EVERY 5 MINUTES AS NEEDED FOR CHEST PAIN   ONE TOUCH ULTRA TEST test strip TEST THREE TIMES A DAY AS DIRECTED.   rivaroxaban (XARELTO) 20 MG TABS tablet Take 1 tablet (20 mg total) by mouth daily with supper.   sildenafil (REVATIO) 20 MG tablet Take 1 tablet (20 mg total) by mouth 3 (three) times daily as needed.     Allergies:   Patient has no known allergies.   Social History   Socioeconomic History   Marital status: Married    Spouse name: Not on file   Number of children: Not on file   Years of education: Not on file   Highest education level: Not on file  Occupational History   Not on file  Tobacco Use   Smoking status: Former    Packs/day: 1.50    Years: 35.00    Total pack years: 52.50    Types: Cigarettes    Quit date: 01/16/2008    Years since quitting: 14.0   Smokeless tobacco: Never   Tobacco comments:    Former smoker 12/09/21  Vaping Use   Vaping Use: Never used  Substance and Sexual Activity   Alcohol use: Not Currently    Comment: occasional   Drug use: No   Sexual activity: Yes  Other Topics Concern   Not on file  Social History Narrative   Pt lives in [place] (right outside of [place])   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  ROS:   Please see the history of present illness.    All other systems reviewed and are negative.  EKGs/Labs/Other Studies Reviewed:    The following studies were reviewed today:   EKG:  The ekg ordered today demonstrates sinus with RBBB. QTc 434m.  Recent Labs: 10/25/2021: BUN 26; Creatinine, Ser 1.26; Hemoglobin 12.8; Platelets 217; Potassium 4.3; Sodium 137 12/09/2021: Magnesium 2.0  Recent Lipid Panel    Component Value Date/Time  CHOL 162 01/13/2015 0854   CHOL 112 01/12/2013 0013   TRIG 62 01/13/2015 0854   TRIG 80 01/12/2013 0013   HDL 47 01/12/2013 0013   VLDL 12 01/13/2015 0854   VLDL 16 01/12/2013 0013   LDLCALC 49 01/12/2013 0013    Physical Exam:    VS:  BP (!) 154/70   Pulse 60   Ht _0  (1.803 m)   Wt 262 lb (118.8 kg)   SpO2 96%   BMI 36.54 kg/m     Wt Readings from Last 3 Encounters:  02/16/22 262 lb (118.8 kg)  01/27/22 265 lb (120.2 kg)  01/12/22 260 lb (117.9 kg)     GEN:  Well nourished, well developed in no acute distress HEENT: Normal NECK: No JVD; No carotid bruits LYMPHATICS: No lymphadenopathy CARDIAC: RRR, no murmurs, rubs, gallops RESPIRATORY:  Clear to auscultation without rales, wheezing or rhonchi  ABDOMEN: Soft, non-tender, non-distended MUSCULOSKELETAL:  No edema; No deformity  SKIN: Warm and dry NEUROLOGIC:  Alert and oriented x 3 PSYCHIATRIC:  Normal affect        ASSESSMENT:    1. Persistent atrial fibrillation ([place])   2. Typical atrial flutter ([place])   3. Primary hypertension   4. Encounter for long-term current use of high risk medication    PLAN:    In order of problems listed  above:  #Persistent atrial fibrillation Maintaining sinus rhythm after his recent ablation.  Continue Tikosyn and Xarelto for stroke prophylaxis.   #Hypertension Above goal today.  Recommend checking blood pressures 1-2 times per week at home and recording the values.  Recommend bringing these recordings to the primary care physician.  #Dofetilide monitoring QTc stable today at 466.  Due for repeat BMP and magnesium today.  Follow-up with APP in 3 months.   Medication Adjustments/Labs and Tests Ordered: Current medicines are reviewed at length with the patient today.  Concerns regarding medicines are outlined above.  No orders of the defined types were placed in this encounter.  No orders of the defined types were placed in this encounter.    Signed, Lars Mage, MD, [facility], [facility] 02/16/2022 8:00 AM    Electrophysiology [facility]

## 2022-02-16 NOTE — Patient Instructions (Signed)
Medication Instructions: None  *If you need a refill on your cardiac medications before your next appointment, please call your pharmacy*   Lab Work: BMP, Mag If you have labs (blood work) drawn today and your tests are completely normal, you will receive your results only by: [place] (if you have MyChart) OR A paper copy in the mail If you have any lab test that is abnormal or we need to change your treatment, we will call you to review the results.   Testing/Procedures: None    Follow-Up: At [facility], you and your health needs are our priority.  As part of our continuing mission to provide you with exceptional heart care, we have created designated Provider Care Teams.  These Care Teams include your primary Cardiologist (physician) and Advanced Practice Providers (APPs -  Physician Assistants and Nurse Practitioners) who all work together to provide you with the care you need, when you need it.  We recommend signing up for the patient portal called "MyChart".  Sign up information is provided on this After Visit Summary.  MyChart is used to connect with patients for Virtual Visits (Telemedicine).  Patients are able to view lab/test results, encounter notes, upcoming appointments, etc.  Non-urgent messages can be sent to your provider as well.   To learn more about what you can do with MyChart, go to [email or web address].    Your next appointment:   3 month(s)  The format for your next appointment:   In Person  Provider:   You will see one of the following Advanced Practice Providers on your designated Care Team:   Murray Hodgkins, NP Christell Faith, PA-C Cadence Kathlen Mody, PA-C Gerrie Nordmann, NP      Other Instructions None   Important Information About Sugar

## 2022-03-03 ENCOUNTER — Other Ambulatory Visit: Payer: Self-pay | Admitting: Cardiovascular Disease

## 2022-03-30 ENCOUNTER — Other Ambulatory Visit: Payer: Self-pay | Admitting: Cardiology

## 2022-04-02 ENCOUNTER — Other Ambulatory Visit: Payer: Self-pay | Admitting: Cardiology

## 2022-04-07 ENCOUNTER — Other Ambulatory Visit: Payer: Self-pay | Admitting: Cardiovascular Disease

## 2022-05-13 ENCOUNTER — Other Ambulatory Visit: Payer: Self-pay | Admitting: Cardiology

## 2022-05-25 ENCOUNTER — Ambulatory Visit: Payer: Medicare Other | Admitting: Cardiology

## 2022-05-31 NOTE — Progress Notes (Unsigned)
  Electrophysiology Office Follow up Visit Note:    Date:  06/01/2022   ID:  Douglas Hunter, DOB 1955-09-17, MRN [id number]  PCP:  Sofie Hartigan, MD  [facility] Cardiologist:  [name], MD  [facility] Electrophysiologist:  Vickie Epley, MD    Interval History:    Douglas Hunter is a 67 y.o. male who presents for a follow up visit.   I last saw him 02/16/2022. He had an AF ablation 11/15/2021. He was doing well at the last appointment. On tikosyn and xarelto. He is with his wife today in clinic.  He has been doing well.  Had 1 brief episode of atrial fibrillation that lasted only a couple hours.   Past medical, surgical, social and family history were reviewed.  ROS:   Please see the history of present illness.    All other systems reviewed and are negative.  EKGs/Labs/Other Studies Reviewed:    The following studies were reviewed today:    EKG:  The ekg ordered today demonstrates sinus rhythm.  Right bundle branch block.  QTc of 460 ms.   Physical Exam:    VS:  BP (!) 132/58   Pulse (!) 58   Ht 5' 11"$  (1.803 m)   Wt 260 lb (117.9 kg)   SpO2 97%   BMI 36.26 kg/m     Wt Readings from Last 3 Encounters:  06/01/22 260 lb (117.9 kg)  02/16/22 262 lb (118.8 kg)  01/27/22 265 lb (120.2 kg)     GEN:  Well nourished, well developed in no acute distress CARDIAC: RRR, no murmurs, rubs, gallops RESPIRATORY:  Clear to auscultation without rales, wheezing or rhonchi       ASSESSMENT:    1. Persistent atrial fibrillation ([place])   2. Encounter for long-term current use of high risk medication   3. Primary hypertension    PLAN:    In order of problems listed above:  #Pers AF On dofetilide and xarelto. QTc 462 today, ok to continue dofetilide Ccheck BMP and Mg today.  #Hypertension At goal today.  Recommend checking blood pressures 1-2 times per week at home and recording the values.  Recommend bringing these recordings to the primary care  physician.    Follow up 4 months with APP.   Signed, Lars Mage, MD, [facility], [facility] 06/01/2022 8:21 AM    Electrophysiology [facility]

## 2022-06-01 ENCOUNTER — Ambulatory Visit: Payer: Medicare Other | Attending: Cardiology | Admitting: Cardiology

## 2022-06-01 ENCOUNTER — Encounter: Payer: Self-pay | Admitting: Cardiology

## 2022-06-01 ENCOUNTER — Other Ambulatory Visit
Admission: RE | Admit: 2022-06-01 | Discharge: 2022-06-01 | Disposition: A | Discharge status: Home / Self Care | Payer: Medicare Other | Source: Ambulatory Visit | Attending: Cardiology | Admitting: Cardiology

## 2022-06-01 ENCOUNTER — Telehealth: Payer: Self-pay | Admitting: Pulmonary Disease

## 2022-06-01 VITALS — BP 132/58 | HR 58 | Ht 71.0 in | Wt 260.0 lb

## 2022-06-01 DIAGNOSIS — I4819 Other persistent atrial fibrillation: Secondary | ICD-10-CM | POA: Insufficient documentation

## 2022-06-01 DIAGNOSIS — I10 Essential (primary) hypertension: Secondary | ICD-10-CM | POA: Diagnosis present

## 2022-06-01 DIAGNOSIS — G4733 Obstructive sleep apnea (adult) (pediatric): Secondary | ICD-10-CM

## 2022-06-01 DIAGNOSIS — Z79899 Other long term (current) drug therapy: Secondary | ICD-10-CM

## 2022-06-01 LAB — BASIC METABOLIC PANEL
Anion gap: 8 (ref 5–15)
BUN: 31 mg/dL — ABNORMAL HIGH (ref 8–23)
CO2: 23 mmol/L (ref 22–32)
Calcium: 9.2 mg/dL (ref 8.9–10.3)
Chloride: 106 mmol/L (ref 98–111)
Creatinine, Ser: 1.22 mg/dL (ref 0.61–1.24)
GFR, Estimated: >60 mL/min (ref >60)
Glucose, Bld: 143 mg/dL — ABNORMAL HIGH (ref 70–99)
Potassium: 4.1 mmol/L (ref 3.5–5.1)
Sodium: 137 mmol/L (ref 135–145)

## 2022-06-01 LAB — MAGNESIUM: Magnesium: 1.9 mg/dL (ref 1.7–2.4)

## 2022-06-01 NOTE — Telephone Encounter (Signed)
Pt states [facility] needs a new rx for his cpap supplies

## 2022-06-01 NOTE — Telephone Encounter (Signed)
Okay to send order for new CPAP supplies. 

## 2022-06-01 NOTE — Telephone Encounter (Signed)
Spoke to patient.  He is requesting order for cpap supplies to be sent to [facility].   Dr. Halford Chessman, please advise if okay to order?

## 2022-06-01 NOTE — Patient Instructions (Signed)
Medication Instructions:  Your physician recommends that you continue on your current medications as directed. Please refer to the Current Medication list given to you today.  *If you need a refill on your cardiac medications before your next appointment, please call your pharmacy*  Lab Work: TODAY: BMET and Mg If you have labs (blood work) drawn today and your tests are completely normal, you will receive your results only by: [place] (if you have MyChart) OR A paper copy in the mail If you have any lab test that is abnormal or we need to change your treatment, we will call you to review the results.  Follow-Up: At [facility], you and your health needs are our priority.  As part of our continuing mission to provide you with exceptional heart care, we have created designated Provider Care Teams.  These Care Teams include your primary Cardiologist (physician) and Advanced Practice Providers (APPs -  Physician Assistants and Nurse Practitioners) who all work together to provide you with the care you need, when you need it.  Your next appointment:   4 month(s)  Provider:   You will see one of the following Advanced Practice Providers on your designated Care Team:   Tommye Standard, [place]" [place], [place], NP

## 2022-06-01 NOTE — Telephone Encounter (Signed)
Lm for patient.  

## 2022-06-02 ENCOUNTER — Telehealth: Payer: Self-pay | Admitting: Pulmonary Disease

## 2022-06-02 NOTE — Telephone Encounter (Signed)
Please see signed encounter. RX not rec'd from [facility]

## 2022-06-02 NOTE — Telephone Encounter (Signed)
Order placed. Patient is aware and voiced his understanding.  Nothing further needed.

## 2022-06-03 NOTE — Telephone Encounter (Signed)
Routing to [facility] for assistance with this. Please advise.

## 2022-06-03 NOTE — Telephone Encounter (Signed)
I have faxed the order to the fax never given by the patient [phone]

## 2022-06-07 NOTE — Telephone Encounter (Signed)
I just spoke with Douglas Hunter with [facility] and she stated that they did get this order and it has already been shipped to the patient

## 2022-06-28 ENCOUNTER — Encounter: Payer: Self-pay | Admitting: Nurse Practitioner

## 2022-06-28 ENCOUNTER — Ambulatory Visit: Payer: Medicare Other | Admitting: Nurse Practitioner

## 2022-06-28 VITALS — BP 124/68 | HR 52 | Temp 97.8°F | Ht 71.0 in | Wt 262.0 lb

## 2022-06-28 DIAGNOSIS — G4733 Obstructive sleep apnea (adult) (pediatric): Secondary | ICD-10-CM | POA: Insufficient documentation

## 2022-06-28 NOTE — Progress Notes (Signed)
Reviewed and agree with assessment/plan.   Chesley Mires, MD [facility] Pulmonary/Critical Care 06/28/2022, 7:16 PM Pager:  [phone]

## 2022-06-28 NOTE — Progress Notes (Signed)
@Patient  ID: Douglas Hunter, male    DOB: 11/05/1955, 67 y.o.   MRN: [id number]  Chief Complaint  Patient presents with   Follow-up    Wearing cpap nightly- pressure and mask okay.     Referring provider: Sofie Hartigan, MD  HPI: 67 year old male, former smoker followed for OSA on CPAP.  He is a patient Dr. Carman Ching and last seen in office 01/27/2022.  Past medical history significant for A-fib on Tikosyn, DM type II, GERD, CAD, ED, hypertension, HLD.  TEST/EVENTS:  12/08/2016 HST: AHI 30, SpO2 low 81%  01/27/2022: OV with Dr. Michela Pitcher.  He is sleeping well.  Uses CPAP nightly with nasal pillow mask.  Pressure setting is comfortable.  He had ablation for A-fib over the summer.  Needs a new CPAP machine.  Order sent for new ResMed CPAP at 14 cmH2O  06/28/2022: Today-follow-up Patient presents today for follow-up.  Since he was here last, he received a new CPAP machine.  He felt like the pressures were a little high so he adjusted his CPAP settings himself.  He is currently on a set pressure of 12.6 cmH2O based on download.  He feels like this is working well for him.  He is sleeping well at night.  Wakes feeling well rested.  No significant daytime fatigue, morning headaches or drowsy driving.  He continues to use a nasal mask, which she feels like works well for him.  05/28/2022-06/26/2022 CPAP 12.6 cmH2O 30/30 days; 100% >4 hr; av use 6 hours 43 minutes Leaks 95th 37.7 AHI 3.9  No Known Allergies  Immunization History  Administered Date(s) Administered   Fluad Quad(high Dose 65+) 01/29/2020   Influenza Inj Mdck Quad Pf 01/21/2019, 02/11/2021, 12/28/2021   Influenza,inj,Quad PF,6+ Mos 01/13/2015, 01/27/2016, 02/13/2017, 01/15/2018   Influenza-Unspecified 01/21/2019   PFIZER(Purple Top)SARS-COV-2 Vaccination 06/24/2019, 07/17/2019   Pneumococcal Polysaccharide-23 10/04/2019    Past Medical History:  Diagnosis Date   AKI (acute kidney injury) ([place]) 03/13/2020   Atrial flutter  ([place])    Coronary artery disease 08/2011   a. NSTEMI 08/2011, LHC w/ severe disease of LCx with good collaterals, lesion was high-risk for intervention due to a steep angulation of the LCx coming from the left main coronary artery as well as heavy calcifications, discused at cath conference w/ consensus advising med Rx; b. 03/2020 Cath: LM nl, LAD 10m, LCX 100p w/ R->L and L->L collats. RCA dominant, nl. EF 55%. PA 33/6/19, PCWP 8.   GERD (gastroesophageal reflux disease)    History of echocardiogram    a. 01/2020 Echo: EF 60-65%, no rwma. Sev elev PASP. Mod dil LA.   Hyperlipidemia    Hypertension    Myocardial infarction [facility])    Paroxysmal atrial fibrillation ([place])    a. s/p ablation 01/2013; b CHADS2VASc => 3 (HTN, DM, vascular disease); c. on Coumadin; d. 12/2019 Amio d/c'd 2/2 hyperthyroidism.   Poorly controlled diabetes mellitus ([place])    RBBB    Sleep apnea     Tobacco History: Social History   Tobacco Use  Smoking Status Former   Packs/day: 1.50   Years: 35.00   Additional pack years: 0.00   Total pack years: 52.50   Types: Cigarettes   Quit date: 01/16/2008   Years since quitting: 14.4  Smokeless Tobacco Never  Tobacco Comments   Former smoker 12/09/21   Counseling given: Not Answered Tobacco comments: Former smoker 12/09/21   Outpatient Medications Prior to Visit  Medication Sig Dispense Refill   acetaminophen (TYLENOL)  500 MG tablet Take 500 mg by mouth 4 (four) times daily as needed for mild pain or moderate pain.     amLODipine (NORVASC) 5 MG tablet TAKE 1 TABLET BY MOUTH ONCE DAILY 90 tablet 0   atorvastatin (LIPITOR) 80 MG tablet Take 80 mg by mouth at bedtime.     benazepril (LOTENSIN) 40 MG tablet TAKE 1 TABLET BY MOUTH ONCE DAILY 90 tablet 3   cholecalciferol (VITAMIN D3) 25 MCG (1000 UNIT) tablet Take 1,000 Units by mouth daily.     Continuous Blood Gluc Sensor (FREESTYLE LIBRE 14 DAY SENSOR) MISC SMARTSIG:1 Kit(s) Topical Every 2 Weeks     dofetilide  (TIKOSYN) 500 MCG capsule TAKE 1 CAPSULE BY MOUTH TWICE DAILY 180 capsule 3   ezetimibe (ZETIA) 10 MG tablet TAKE 1 TABLET BY MOUTH ONCE DAILY 90 tablet 1   furosemide (LASIX) 20 MG tablet TAKE 1 TABLET BY MOUTH TWICE DAILY AS NEEDED 180 tablet 0   insulin glargine (LANTUS) 100 UNIT/ML injection Inject 35 Units into the skin 2 (two) times daily.     insulin lispro (HUMALOG) 100 UNIT/ML KiwkPen Inject 16-22 Units into the skin 2 (two) times daily. Sliding Scale     INSULIN SYRINGE 1CC/29G 29G X 1/2" 1 ML MISC USE AS DIRECTED 100 each PRN   metoprolol succinate (TOPROL-XL) 25 MG 24 hr tablet TAKE 1 TABLET BY MOUTH ONCE DAILY 90 tablet 1   nitroGLYCERIN (NITROSTAT) 0.4 MG SL tablet DISSOLVE 1 TABLET UNDER THE TONGUE EVERY 5 MINUTES AS NEEDED FOR CHEST PAIN 25 tablet 0   ONE TOUCH ULTRA TEST test strip TEST THREE TIMES A DAY AS DIRECTED. 100 each 12   rivaroxaban (XARELTO) 20 MG TABS tablet Take 1 tablet (20 mg total) by mouth daily with supper. 28 tablet 0   sildenafil (REVATIO) 20 MG tablet Take 1 tablet (20 mg total) by mouth 3 (three) times daily as needed. 90 tablet 3   Facility-Administered Medications Prior to Visit  Medication Dose Route Frequency Provider Last Rate Last Admin   sodium chloride flush (NS) 0.9 % injection 3 mL  3 mL Intravenous Q12H Visser, Jacquelyn D, PA-C         Review of Systems:   Constitutional: No weight loss or gain, night sweats, fevers, chills, fatigue, or lassitude. HEENT: No headaches, difficulty swallowing, tooth/dental problems, or sore throat. No sneezing, itching, ear ache, nasal congestion, or post nasal drip CV:  No chest pain, orthopnea, PND, swelling in lower extremities, anasarca, dizziness, palpitations, syncope Resp: No shortness of breath with exertion or at rest. No excess mucus or change in color of mucus. No productive or non-productive. No hemoptysis. No wheezing.  No chest wall deformity GI:  No heartburn, indigestion, abdominal pain, nausea,  vomiting, diarrhea, change in bowel habits, loss of appetite, bloody stools.  GU: No dysuria, change in color of urine, urgency or frequency.  Neuro: No dizziness or lightheadedness.  Psych: No depression or anxiety. Mood stable.     Physical Exam:  BP 124/68 (BP Location: Left Arm, Cuff Size: Normal)   Pulse (!) 52   Temp 97.8 F (36.6 C) (Temporal)   Ht 5\' 11"  (1.803 m)   Wt 262 lb (118.8 kg)   SpO2 96%   BMI 36.54 kg/m   GEN: Pleasant, interactive, well-appearing; obese; in no acute distress. HEENT:  Normocephalic and atraumatic. PERRLA. Sclera white. Nasal turbinates pink, moist and patent bilaterally. No rhinorrhea present. Oropharynx pink and moist, without exudate or edema. No lesions,  ulcerations, or postnasal drip.  NECK:  Supple w/ fair ROM. No JVD present. Normal carotid impulses w/o bruits. Thyroid symmetrical with no goiter or nodules palpated. No lymphadenopathy.   CV: RRR, no m/r/g, no peripheral edema. Pulses intact, +2 bilaterally. No cyanosis, pallor or clubbing. PULMONARY:  Unlabored, regular breathing. Clear bilaterally A&P w/o wheezes/rales/rhonchi. No accessory muscle use.  GI: BS present and normoactive. Soft, non-tender to palpation. No organomegaly or masses detected.  MSK: No erythema, warmth or tenderness. Cap refil <2 sec all extrem. No deformities or joint swelling noted.  Neuro: A/Ox3. No focal deficits noted.   Skin: Warm, no lesions or rashe Psych: Normal affect and behavior. Judgement and thought content appropriate.     Lab Results:  CBC    Component Value Date/Time   WBC 8.3 10/25/2021 0819   RBC 4.26 10/25/2021 0819   HGB 12.8 (L) 10/25/2021 0819   HGB 12.9 (L) 02/20/2020 0925   HCT 38.4 (L) 10/25/2021 0819   HCT 38.0 02/20/2020 0925   PLT 217 10/25/2021 0819   PLT 269 02/20/2020 0925   MCV 90.1 10/25/2021 0819   MCV 89 02/20/2020 0925   MCV 95 03/02/2014 1416   MCH 30.0 10/25/2021 0819   MCHC 33.3 10/25/2021 0819   RDW 13.4  10/25/2021 0819   RDW 12.1 02/20/2020 0925   RDW 12.8 03/02/2014 1416   LYMPHSABS 1.5 03/13/2020 0044   LYMPHSABS 2.0 10/26/2016 1423   LYMPHSABS 1.5 03/02/2014 1416   MONOABS 1.2 (H) 03/13/2020 0044   MONOABS 1.3 (H) 03/02/2014 1416   EOSABS 0.2 03/13/2020 0044   EOSABS 0.3 10/26/2016 1423   EOSABS 0.3 03/02/2014 1416   BASOSABS 0.1 03/13/2020 0044   BASOSABS 0.0 10/26/2016 1423   BASOSABS 0.1 03/02/2014 1416    BMET    Component Value Date/Time   NA 137 06/01/2022 0848   NA 138 04/28/2021 0932   NA 131 (L) 03/02/2014 1416   K 4.1 06/01/2022 0848   K 4.1 03/02/2014 1416   CL 106 06/01/2022 0848   CL 98 03/02/2014 1416   CO2 23 06/01/2022 0848   CO2 24 03/02/2014 1416   GLUCOSE 143 (H) 06/01/2022 0848   GLUCOSE 374 (H) 03/02/2014 1416   BUN 31 (H) 06/01/2022 0848   BUN 26 04/28/2021 0932   BUN 18 03/02/2014 1416   CREATININE 1.22 06/01/2022 0848   CREATININE 1.27 03/02/2014 1416   CALCIUM 9.2 06/01/2022 0848   CALCIUM 8.7 03/02/2014 1416   GFRNONAA >60 06/01/2022 0848   GFRNONAA >60 03/02/2014 1416   GFRNONAA >60 01/12/2013 0013   GFRAA 53 (L) 02/20/2020 0925   GFRAA >60 03/02/2014 1416   GFRAA >60 01/12/2013 0013    BNP    Component Value Date/Time   BNP 236.0 (H) 08/17/2017 0801     Imaging:  No results found.        No data to display          No results found for: "NITRICOXIDE"      Assessment & Plan:   OSA on CPAP Severe OSA on CPAP.  He has excellent compliance and good control on download today.  Receives good benefit from use.  He did adjust his own CPAP settings down from 14 cmH2O to 12.6 cm water.  He had felt like the 14 was too much pressure.  He seems to be doing well on this adjustment.  I did advise him to not change his CPAP settings in the future and to consult  with his if he has any further issues.  He verbalized understanding.  Cautioned on safe driving practices.  Patient Instructions  Continue to use CPAP every night,  minimum of 4-6 hours a night.  Change equipment every 30 days or as directed by DME. Wash your tubing with warm soap and water daily, hang to dry. Wash humidifier portion weekly.  Be aware of reduced alertness and do not drive or operate heavy machinery if experiencing this or drowsiness.  Exercise encouraged, as tolerated. Avoid or decrease alcohol consumption and medications that make you more sleepy, if possible. Notify if persistent daytime sleepiness occurs even with consistent use of CPAP.   Follow up in 6 months with Dr. Halford Chessman or NP. If symptoms worsen, please contact office for sooner follow up    I spent 28 minutes of dedicated to the care of this patient on the date of this encounter to include pre-visit review of records, face-to-face time with the patient discussing conditions above, post visit ordering of testing, clinical documentation with the electronic health record, making appropriate referrals as documented, and communicating necessary findings to members of the patients care team.  Clayton Bibles, NP 06/28/2022  Pt aware and understands NP's role.

## 2022-06-28 NOTE — Assessment & Plan Note (Signed)
Severe OSA on CPAP.  He has excellent compliance and good control on download today.  Receives good benefit from use.  He did adjust his own CPAP settings down from 14 cmH2O to 12.6 cm water.  He had felt like the 14 was too much pressure.  He seems to be doing well on this adjustment.  I did advise him to not change his CPAP settings in the future and to consult with his if he has any further issues.  He verbalized understanding.  Cautioned on safe driving practices.  Patient Instructions  Continue to use CPAP every night, minimum of 4-6 hours a night.  Change equipment every 30 days or as directed by DME. Wash your tubing with warm soap and water daily, hang to dry. Wash humidifier portion weekly.  Be aware of reduced alertness and do not drive or operate heavy machinery if experiencing this or drowsiness.  Exercise encouraged, as tolerated. Avoid or decrease alcohol consumption and medications that make you more sleepy, if possible. Notify if persistent daytime sleepiness occurs even with consistent use of CPAP.   Follow up in 6 months with Dr. Halford Chessman or NP. If symptoms worsen, please contact office for sooner follow up

## 2022-06-28 NOTE — Patient Instructions (Addendum)
Continue to use CPAP every night, minimum of 4-6 hours a night.  Change equipment every 30 days or as directed by DME. Wash your tubing with warm soap and water daily, hang to dry. Wash humidifier portion weekly.  Be aware of reduced alertness and do not drive or operate heavy machinery if experiencing this or drowsiness.  Exercise encouraged, as tolerated. Avoid or decrease alcohol consumption and medications that make you more sleepy, if possible. Notify if persistent daytime sleepiness occurs even with consistent use of CPAP.   Follow up in 6 months with Dr. Halford Chessman or NP. If symptoms worsen, please contact office for sooner follow up

## 2022-07-05 ENCOUNTER — Other Ambulatory Visit: Payer: Self-pay | Admitting: Cardiovascular Disease

## 2022-07-05 NOTE — Telephone Encounter (Signed)
Last visit with Dr Rockey Situ 01/12/22--12 month f/u plan  No appt with Dr. Rockey Situ at this time but does have f/u Dr. Quentin Ore on 09/28/22

## 2022-08-05 ENCOUNTER — Other Ambulatory Visit: Payer: Self-pay | Admitting: Cardiovascular Disease

## 2022-09-27 NOTE — Progress Notes (Signed)
Cardiology Office Note Date:  09/28/2022  Patient ID:  Douglas Hunter, Douglas Hunter Aug 12, 1955, MRN [id number] PCP:  Marina Goodell, MD  Cardiologist:  [name], MD Electrophysiologist: [name], MD    Chief Complaint: 4 mon afib follow-up  History of Present Illness: Douglas Hunter is a 67 y.o. male with PMH notable for HTN, persis AFib, CAD, OSA; seen today for [name], MD for routine electrophysiology followup.  He is s/p afib ablation 01/2013 by Dr. Johney Frame and 11/2021 by Dr. Lalla Brothers Last saw Dr. Lalla Brothers 05/2022, doing well on tikosyn. Had one brief AF episode that lasted a couple hours.  Today, he states he is feeling very well. No further AF episodes that he is aware of. No palpitations. Continues to take tikosyn BID, though dose admit to missing a dose every couple months. He sets alarm on his phone for evening dose - this is dose he is most likely to take late or miss.  Takes xarelto daily, no bleeding concerns.  His PCP recently started him on jardiance for T2DM, has had increased UOP since starting.   Uses CPAP nightly, can't sleep without it.   he denies chest pain, palpitations, dyspnea, PND, orthopnea, nausea, vomiting, dizziness, syncope, edema, weight gain, or early satiety.    AAD History: Amiodarone, stopped 2/2 hyperthyroid Tikosyn   Past Medical History:  Diagnosis Date   AKI (acute kidney injury) ([facility]) 03/13/2020   Atrial flutter ([facility])    Coronary artery disease 08/2011   a. NSTEMI 08/2011, LHC w/ severe disease of LCx with good collaterals, lesion was high-risk for intervention due to a steep angulation of the LCx coming from the left main coronary artery as well as heavy calcifications, discused at cath conference w/ consensus advising med Rx; b. 03/2020 Cath: LM nl, LAD 87m, LCX 100p w/ R->L and L->L collats. RCA dominant, nl. EF 55%. PA 33/6/19, PCWP 8.   GERD (gastroesophageal reflux disease)    History of echocardiogram    a. 01/2020  Echo: EF 60-65%, no rwma. Sev elev PASP. Mod dil LA.   Hyperlipidemia    Hypertension    Myocardial infarction [facility])    Paroxysmal atrial fibrillation ([facility])    a. s/p ablation 01/2013; b CHADS2VASc => 3 (HTN, DM, vascular disease); c. on Coumadin; d. 12/2019 Amio d/c'd 2/2 hyperthyroidism.   Poorly controlled diabetes mellitus ([facility])    RBBB    Sleep apnea     Past Surgical History:  Procedure Laterality Date   ABLATION  01/29/2013   PVI and CTI by Dr Johney Frame for atrial flutter and paroxysmal atrial fibrillation   ANTERIOR CERVICAL DECOMP/DISCECTOMY FUSION N/A 04/18/2018   Procedure: ANTERIOR CERVICAL DECOMPRESSION/DISCECTOMY FUSION 2 LEVELS C5-7;  Surgeon: [name], MD;  Location: [facility] ORS;  Service: Neurosurgery;  Laterality: N/A;   ATRIAL FIBRILLATION ABLATION N/A 01/29/2013   Procedure: ATRIAL FIBRILLATION ABLATION;  Surgeon: [name], MD;  Location: [facility] CATH LAB;  Service: Cardiovascular;  Laterality: N/A;   ATRIAL FIBRILLATION ABLATION N/A 11/15/2021   Procedure: ATRIAL FIBRILLATION ABLATION;  Surgeon: [name], MD;  Location: [facility] INVASIVE CV LAB;  Service: Cardiovascular;  Laterality: N/A;   CARDIAC CATHETERIZATION  08/03/2011   COLONOSCOPY WITH PROPOFOL N/A 02/02/2016   Procedure: COLONOSCOPY WITH PROPOFOL;  Surgeon: [name], MD;  Location: [facility] ENDOSCOPY;  Service: Endoscopy;  Laterality: N/A;   LUMBAR LAMINECTOMY/DECOMPRESSION MICRODISCECTOMY N/A 06/07/2017   Procedure: LUMBAR LAMINECTOMY/DECOMPRESSION MICRODISCECTOMY 2 LEVELS-L3-4,L4-5;  Surgeon: [name], MD;  Location: [facility]  ORS;  Service: Neurosurgery;  Laterality: N/A;   RIGHT/LEFT HEART CATH AND CORONARY ANGIOGRAPHY N/A 03/06/2020   Procedure: RIGHT/LEFT HEART CATH AND CORONARY ANGIOGRAPHY;  Surgeon: [name], MD;  Location: [facility] INVASIVE CV LAB;  Service: Cardiovascular;  Laterality: N/A;   TEE WITHOUT CARDIOVERSION N/A 01/28/2013   Procedure: TRANSESOPHAGEAL ECHOCARDIOGRAM (TEE);   Surgeon: [name], MD;  Location: [facility] ENDOSCOPY;  Service: Cardiovascular;  Laterality: N/A;    Current Outpatient Medications  Medication Instructions   acetaminophen (TYLENOL) 500 mg, Oral, 4 times daily PRN   amLODipine (NORVASC) 5 MG tablet TAKE 1 TABLET BY MOUTH ONCE DAILY   atorvastatin (LIPITOR) 80 mg, Oral, Daily at bedtime   benazepril (LOTENSIN) 40 MG tablet TAKE 1 TABLET BY MOUTH ONCE DAILY   cholecalciferol (VITAMIN D3) 1,000 Units, Oral, Daily   Continuous Blood Gluc Sensor (FREESTYLE LIBRE 14 DAY SENSOR) MISC SMARTSIG:1 Kit(s) Topical Every 2 Weeks   dofetilide (TIKOSYN) 500 MCG capsule TAKE 1 CAPSULE BY MOUTH TWICE DAILY   ezetimibe (ZETIA) 10 MG tablet TAKE 1 TABLET BY MOUTH ONCE DAILY   furosemide (LASIX) 20 MG tablet TAKE 1 TABLET BY MOUTH TWICE DAILY AS NEEDED   insulin glargine (LANTUS) 35 Units, Subcutaneous, 2 times daily   insulin lispro (HUMALOG) 16-22 Units, Subcutaneous, 2 times daily, Sliding Scale   INSULIN SYRINGE 1CC/29G 29G X 1/2" 1 ML MISC USE AS DIRECTED   Jardiance 25 mg, Oral, Daily   metoprolol succinate (TOPROL-XL) 25 mg, Oral, Daily   nitroGLYCERIN (NITROSTAT) 0.4 MG SL tablet DISSOLVE 1 TABLET UNDER THE TONGUE EVERY 5 MINUTES AS NEEDED FOR CHEST PAIN   ONE TOUCH ULTRA TEST test strip TEST THREE TIMES A DAY AS DIRECTED.   rivaroxaban (XARELTO) 20 mg, Oral, Daily with supper   sildenafil (REVATIO) 20 mg, Oral, 3 times daily PRN    Social History:  The patient  reports that he quit smoking about 14 years ago. His smoking use included cigarettes. He has a 52.50 pack-year smoking history. He has never used smokeless tobacco. He reports that he does not currently use alcohol. He reports that he does not use drugs.   Family History:  The patient's family history includes Cancer in his father; Diabetes in his son; Heart attack in his maternal uncle; Heart attack (age of onset: 57) in his father; Heart disease in his mother and paternal grandfather;  Stroke in his mother.  ROS:  Please see the history of present illness. All other systems are reviewed and otherwise negative.   PHYSICAL EXAM:  VS:  BP 120/66 (BP Location: Left Arm, Patient Position: Sitting, Cuff Size: Large)   Pulse (!) 56   Ht 5\' 11"  (1.803 m)   Wt 259 lb (117.5 kg)   SpO2 98%   BMI 36.12 kg/m  BMI: Body mass index is 36.12 kg/m.  GEN- The patient is well appearing, alert and oriented x 3 today.   Lungs- Clear to ausculation bilaterally, normal work of breathing.  Heart- Irregularly irregular rate and rhythm, no murmurs, rubs or gallops Extremities- 1+ woody peripheral edema, warm, dry   EKG is ordered. Personal review of EKG from today shows:  EKG Interpretation  Date/Time:  Wednesday September 28 2022 10:42:38 EDT Ventricular Rate:  56 PR Interval:  154 QRS Duration: 140 QT Interval:  460 QTC Calculation: 443 R Axis:   -16 Text Interpretation: Sinus bradycardia with Atrial premature complexes Right bundle branch block Confirmed by Sherie Don [phone]) on 09/28/2022 10:53:01 AM  Recent Labs: 10/25/2021: Hemoglobin 12.8; Platelets 217 06/01/2022: BUN 31; Creatinine, Ser 1.22; Magnesium 1.9; Potassium 4.1; Sodium 137  No results found for requested labs within last 365 days.   CrCl cannot be calculated (Patient's most recent lab result is older than the maximum 21 days allowed.).   Wt Readings from Last 3 Encounters:  09/28/22 259 lb (117.5 kg)  06/28/22 262 lb (118.8 kg)  06/01/22 260 lb (117.9 kg)     Additional studies reviewed include: Previous EP, cardiology notes.   AF ablation, 11/15/2021 1. Successful redo PVI 2. Successful ablation/isolation of the posterior wall 3. Successful redo ablation of the cavotricuspid isthmus 4. Intracardiac echo reveals trivial pericardial effusion, dilated LA, normal LA architecture 5. No early apparent complications. 6. Colchicine 0.6mg  PO BID x 5 days 7. Protonix 40mg  PO daily x 45 days  TTE, 08/03/2021  1.  Left ventricular ejection fraction, by estimation, is 60 to 65%. The left ventricle has normal function. The left ventricle has no regional wall motion abnormalities. Left ventricular diastolic parameters are consistent with Grade II diastolic dysfunction (pseudonormalization).   2. Right ventricular systolic function is normal. The right ventricular size is normal. There is mildly elevated pulmonary artery systolic pressure. The estimated right ventricular systolic pressure is 38.9 mmHg.   3. Left atrial size was moderately dilated.   4. The mitral valve is normal in structure. No evidence of mitral valve regurgitation. No evidence of mitral stenosis. Moderate mitral annular calcification.   5. The aortic valve was not well visualized. Aortic valve regurgitation is not visualized. No aortic stenosis is present.   6. The inferior vena cava is normal in size with greater than 50% respiratory variability, suggesting right atrial pressure of 3 mmHg.    ASSESSMENT AND PLAN:  #) parox AFib S/p ablation x 2 No recent AF episodes QTC today 443, stable from prior Continue tikosyn BID Recent labs with PCP, stable  Encouraged to use multiple alarms in evenings, or to snooze alarm instead of turning off to prevent late doses or missing doses   #) hypercoag d/t afib CHA2DS2-VASc Score = 4 [CHF History: 0, HTN History: 1, Diabetes History: 1, Stroke History: 0, Vascular Disease History: 1, Age Score: 1, Gender Score: 0].  Therefore, the patient's annual risk of stroke is 4.8 %. NOAC - 20mg  xarelto daily, appropriately dosed No bleeding concerns    Current medicines are reviewed at length with the patient today.   The patient does not have concerns regarding his medicines.  The following changes were made today:  none  Labs/ tests ordered today include:  Orders Placed This Encounter  Procedures   EKG 12-Lead     Disposition: Follow up with Dr. Lalla Brothers or EP APP in 6  months   Signed, Sherie Don, NP  09/28/22  11:18 AM  Electrophysiology [facility]

## 2022-09-28 ENCOUNTER — Ambulatory Visit: Payer: Medicare Other | Attending: Cardiology | Admitting: Cardiology

## 2022-09-28 ENCOUNTER — Encounter: Payer: Self-pay | Admitting: Cardiology

## 2022-09-28 ENCOUNTER — Other Ambulatory Visit: Payer: Self-pay | Admitting: Cardiology

## 2022-09-28 VITALS — BP 120/66 | HR 56 | Ht 71.0 in | Wt 259.0 lb

## 2022-09-28 DIAGNOSIS — I480 Paroxysmal atrial fibrillation: Secondary | ICD-10-CM

## 2022-09-28 DIAGNOSIS — Z79899 Other long term (current) drug therapy: Secondary | ICD-10-CM | POA: Diagnosis not present

## 2022-09-28 DIAGNOSIS — D6869 Other thrombophilia: Secondary | ICD-10-CM

## 2022-09-28 DIAGNOSIS — I10 Essential (primary) hypertension: Secondary | ICD-10-CM

## 2022-09-28 NOTE — Patient Instructions (Signed)
Medication Instructions:  Your physician recommends that you continue on your current medications as directed. Please refer to the Current Medication list given to you today.  *If you need a refill on your cardiac medications before your next appointment, please call your pharmacy*   Lab Work: No labs ordered  If you have labs (blood work) drawn today and your tests are completely normal, you will receive your results only by: MyChart Message (if you have MyChart) OR A paper copy in the mail If you have any lab test that is abnormal or we need to change your treatment, we will call you to review the results.   Testing/Procedures: No testing ordered  Follow-Up: At [facility], you and your health needs are our priority.  As part of our continuing mission to provide you with exceptional heart care, we have created designated Provider Care Teams.  These Care Teams include your primary Cardiologist (physician) and Advanced Practice Providers (APPs -  Physician Assistants and Nurse Practitioners) who all work together to provide you with the care you need, when you need it.  We recommend signing up for the patient portal called "MyChart".  Sign up information is provided on this After Visit Summary.  MyChart is used to connect with patients for Virtual Visits (Telemedicine).  Patients are able to view lab/test results, encounter notes, upcoming appointments, etc.  Non-urgent messages can be sent to your provider as well.   To learn more about what you can do with MyChart, go to [email or web address].    Your next appointment:   6 month(s)  Provider:   Cameron Lambert, MD or Suzann Riddle, NP 

## 2022-11-24 IMAGING — CR DG CHEST 2V
2 series · 2 of 2 positions shown · non-contrast
Comparison: August 17, 2017

CLINICAL DATA: AFib

EXAM:
CHEST - 2 VIEW

[chest pa]
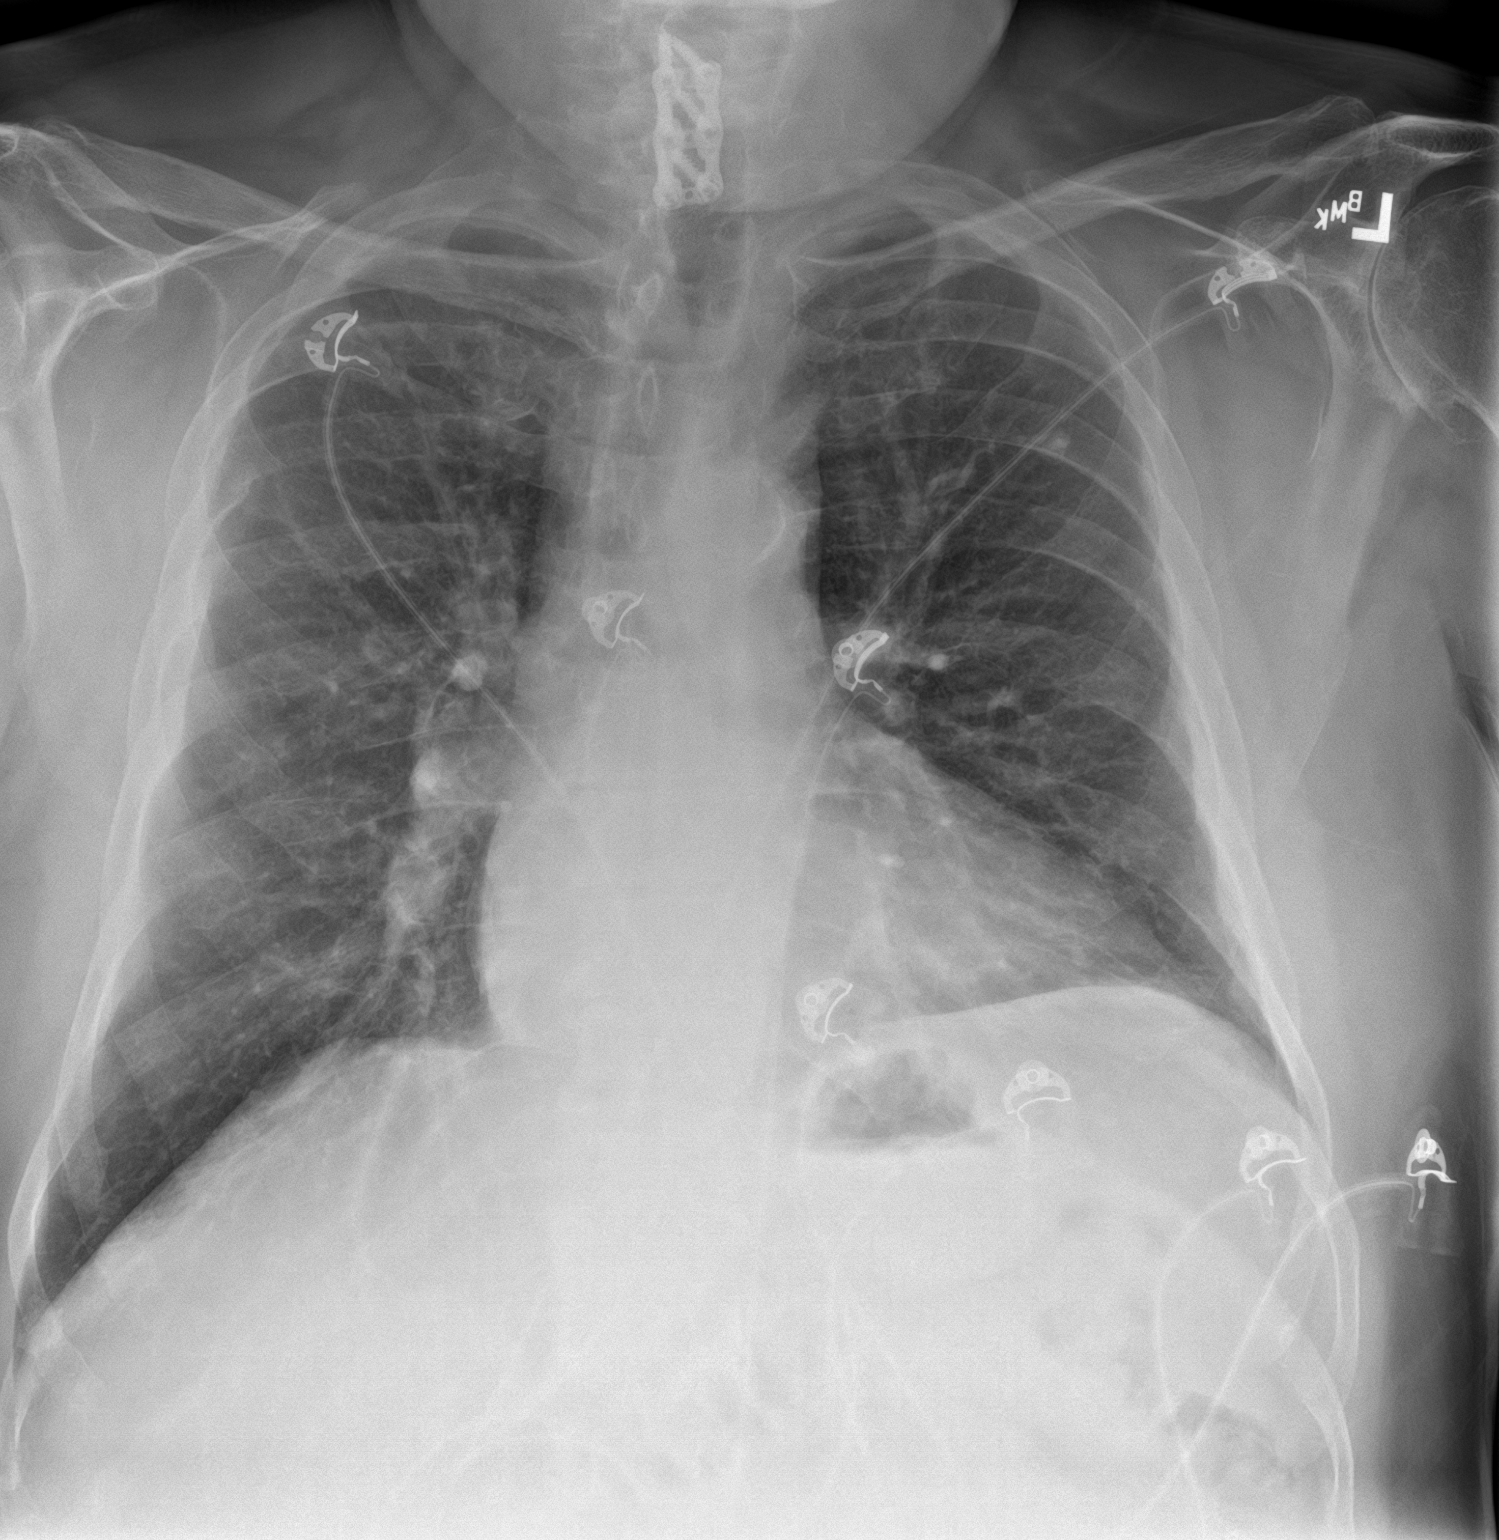

[chest lat]
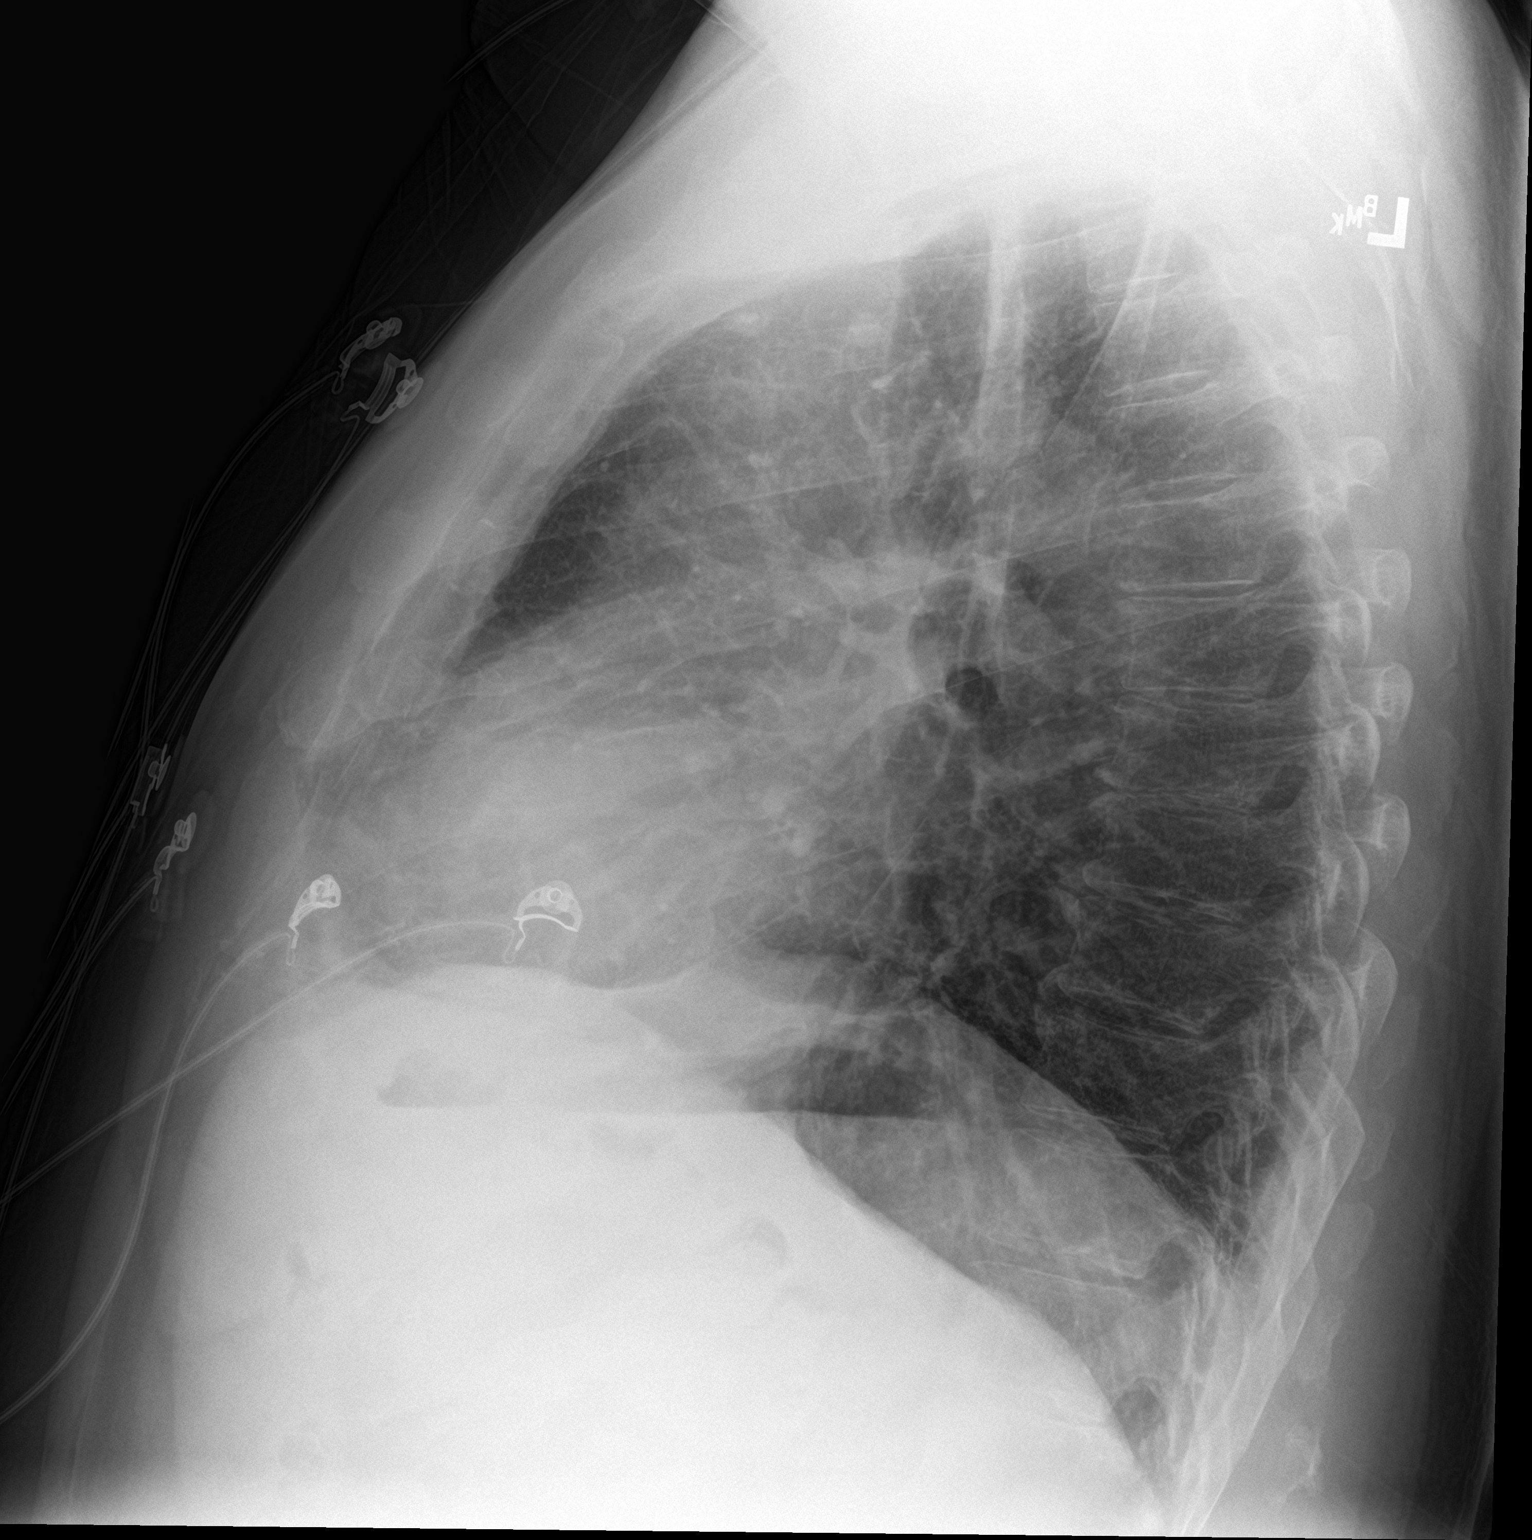

[2 of 2 positions shown; findings below may reference images not displayed]

FINDINGS: The heart size is stable. Aortic calcifications are noted. The
patient's trachea is essentially midline. There is a stable
calcified granuloma in the left upper lobe. There is scarring
atelectasis at the lung bases. There is no large pleural effusion.
There are degenerative changes of the spine. There is no acute
displaced fracture.
IMPRESSION: No active cardiopulmonary disease.

## 2022-12-29 ENCOUNTER — Other Ambulatory Visit: Payer: Self-pay | Admitting: Cardiovascular Disease

## 2023-01-10 ENCOUNTER — Encounter: Payer: Self-pay | Admitting: Nurse Practitioner

## 2023-01-10 ENCOUNTER — Ambulatory Visit: Payer: Medicare Other | Admitting: Nurse Practitioner

## 2023-01-10 VITALS — BP 140/70 | HR 54 | Temp 97.6°F | Ht 71.0 in | Wt 260.0 lb

## 2023-01-10 DIAGNOSIS — Z23 Encounter for immunization: Secondary | ICD-10-CM

## 2023-01-10 DIAGNOSIS — G4733 Obstructive sleep apnea (adult) (pediatric): Secondary | ICD-10-CM | POA: Diagnosis not present

## 2023-01-10 NOTE — Patient Instructions (Addendum)
Continue to use CPAP every night, minimum of 4-6 hours a night.  Change equipment every 30 days or as directed by DME. Wash your tubing with warm soap and water daily, hang to dry. Wash humidifier portion weekly.  Be aware of reduced alertness and do not drive or operate heavy machinery if experiencing this or drowsiness.  Exercise encouraged, as tolerated. Avoid or decrease alcohol consumption and medications that make you more sleepy, if possible. Notify if persistent daytime sleepiness occurs even with consistent use of CPAP.   Flu shot today    Follow up in 1 year with Dr. Belia Heman or NP. If symptoms worsen, please contact office for sooner follow up

## 2023-01-10 NOTE — Progress Notes (Signed)
@Patient  ID: Douglas Hunter, male    DOB: November 10, 1955, 68 y.o.   MRN: [id number]  Chief Complaint  Patient presents with   Follow-up    Wearing CPAP nightly. Tolerating pressure and mask.     Referring provider: Marina Goodell, MD  HPI: 67 year old male, former smoker followed for OSA on CPAP.  He is a patient Dr. Evlyn Courier and last seen in office 06/28/2022 by [facility] NP.  Past medical history significant for A-fib on Tikosyn, DM type II, GERD, CAD, ED, hypertension, HLD.  TEST/EVENTS:  12/08/2016 HST: AHI 30, SpO2 low 81%  01/27/2022: OV with Dr. York Spaniel.  He is sleeping well.  Uses CPAP nightly with nasal pillow mask.  Pressure setting is comfortable.  He had ablation for A-fib over the summer.  Needs a new CPAP machine.  Order sent for new ResMed CPAP at 14 cmH2O  06/28/2022: Ov with Jathen Sudano NP for follow-up.  Since he was here last, he received a new CPAP machine.  He felt like the pressures were a little high so he adjusted his CPAP settings himself.  He is currently on a set pressure of 12.6 cmH2O based on download.  He feels like this is working well for him.  He is sleeping well at night.  Wakes feeling well rested.  No significant daytime fatigue, morning headaches or drowsy driving.  He continues to use a nasal mask, which she feels like works well for him. 05/28/2022-06/26/2022 CPAP 12.6 cmH2O 30/30 days; 100% >4 hr; av use 6 hours 43 minutes Leaks 95th 37.7 AHI 3.9  01/10/2023: Today - follow up Patient presents today for follow up. Doing well on CPAP. No issues with his machine. Sleeping well at night. Wakes feeling rested. No significant daytime fatigue, morning headaches or drowsy driving. He continues to use nasal mask. No significant leaks.   12/10/2022-01/08/2023: CPAP 12.6 cmH2O 30/30 days; 97% >4 hr; average use 6 hr 46 min Leaks 47.6 AHI 3.1  No Known Allergies  Immunization History  Administered Date(s) Administered   Fluad Quad(high Dose 65+) 01/29/2020   Fluad  Trivalent(High Dose 65+) 01/10/2023   Influenza Inj Mdck Quad Pf 01/21/2019, 02/11/2021, 12/28/2021   Influenza,inj,Quad PF,6+ Mos 01/13/2015, 01/27/2016, 02/13/2017, 01/15/2018   Influenza-Unspecified 01/21/2019   PFIZER(Purple Top)SARS-COV-2 Vaccination 06/24/2019, 07/17/2019   Pneumococcal Polysaccharide-23 10/04/2019    Past Medical History:  Diagnosis Date   AKI (acute kidney injury) (HCC) 03/13/2020   Atrial flutter (HCC)    Coronary artery disease 08/2011   a. NSTEMI 08/2011, LHC w/ severe disease of LCx with good collaterals, lesion was high-risk for intervention due to a steep angulation of the LCx coming from the left main coronary artery as well as heavy calcifications, discused at cath conference w/ consensus advising med Rx; b. 03/2020 Cath: LM nl, LAD 75m, LCX 100p w/ R->L and L->L collats. RCA dominant, nl. EF 55%. PA 33/6/19, PCWP 8.   GERD (gastroesophageal reflux disease)    History of echocardiogram    a. 01/2020 Echo: EF 60-65%, no rwma. Sev elev PASP. Mod dil LA.   Hyperlipidemia    Hypertension    Myocardial infarction [facility])    Paroxysmal atrial fibrillation (HCC)    a. s/p ablation 01/2013; b CHADS2VASc => 3 (HTN, DM, vascular disease); c. on Coumadin; d. 12/2019 Amio d/c'd 2/2 hyperthyroidism.   Poorly controlled diabetes mellitus (HCC)    RBBB    Sleep apnea     Tobacco History: Social History   Tobacco Use  Smoking  Status Former   Current packs/day: 0.00   Average packs/day: 1.5 packs/day for 35.0 years (52.5 ttl pk-yrs)   Types: Cigarettes   Start date: 01/15/1973   Quit date: 01/16/2008   Years since quitting: 14.9  Smokeless Tobacco Never  Tobacco Comments   Former smoker 12/09/21   Counseling given: Not Answered Tobacco comments: Former smoker 12/09/21   Outpatient Medications Prior to Visit  Medication Sig Dispense Refill   acetaminophen (TYLENOL) 500 MG tablet Take 500 mg by mouth 4 (four) times daily as needed for mild pain or moderate  pain.     amLODipine (NORVASC) 5 MG tablet Take 1 tablet (5 mg total) by mouth daily. 180 tablet 3   atorvastatin (LIPITOR) 80 MG tablet Take 80 mg by mouth at bedtime.     benazepril (LOTENSIN) 40 MG tablet TAKE 1 TABLET BY MOUTH ONCE DAILY 90 tablet 3   cholecalciferol (VITAMIN D3) 25 MCG (1000 UNIT) tablet Take 1,000 Units by mouth daily.     Continuous Blood Gluc Sensor (FREESTYLE LIBRE 14 DAY SENSOR) MISC SMARTSIG:1 Kit(s) Topical Every 2 Weeks     dofetilide (TIKOSYN) 500 MCG capsule TAKE 1 CAPSULE BY MOUTH TWICE DAILY 180 capsule 3   ezetimibe (ZETIA) 10 MG tablet TAKE 1 TABLET BY MOUTH ONCE DAILY 90 tablet 1   furosemide (LASIX) 20 MG tablet TAKE 1 TABLET BY MOUTH TWICE DAILY AS NEEDED 180 tablet 0   insulin glargine (LANTUS) 100 UNIT/ML injection Inject 35 Units into the skin 2 (two) times daily.     insulin lispro (HUMALOG) 100 UNIT/ML KiwkPen Inject 16-22 Units into the skin 2 (two) times daily. Sliding Scale     INSULIN SYRINGE 1CC/29G 29G X 1/2" 1 ML MISC USE AS DIRECTED 100 each PRN   JARDIANCE 25 MG TABS tablet Take 25 mg by mouth daily.     metoprolol succinate (TOPROL-XL) 25 MG 24 hr tablet TAKE 1 TABLET BY MOUTH ONCE DAILY 90 tablet 0   nitroGLYCERIN (NITROSTAT) 0.4 MG SL tablet DISSOLVE 1 TABLET UNDER THE TONGUE EVERY 5 MINUTES AS NEEDED FOR CHEST PAIN 25 tablet 0   ONE TOUCH ULTRA TEST test strip TEST THREE TIMES A DAY AS DIRECTED. 100 each 12   rivaroxaban (XARELTO) 20 MG TABS tablet Take 1 tablet (20 mg total) by mouth daily with supper. 28 tablet 0   sildenafil (REVATIO) 20 MG tablet Take 1 tablet (20 mg total) by mouth 3 (three) times daily as needed. 90 tablet 3   Facility-Administered Medications Prior to Visit  Medication Dose Route Frequency Provider Last Rate Last Admin   sodium chloride flush (NS) 0.9 % injection 3 mL  3 mL Intravenous Q12H Visser, Jacquelyn D, PA-C         Review of Systems:   Constitutional: No weight loss or gain, night sweats, fevers,  chills, fatigue, or lassitude. HEENT: No headaches, difficulty swallowing, tooth/dental problems, or sore throat. No sneezing, itching, ear ache, nasal congestion, or post nasal drip CV:  No chest pain, orthopnea, PND, swelling in lower extremities, anasarca, dizziness, palpitations, syncope Resp: No shortness of breath with exertion or at rest. No excess mucus or change in color of mucus. No productive or non-productive. No hemoptysis. No wheezing.  No chest wall deformity GI:  No heartburn, indigestion, abdominal pain, nausea, vomiting, diarrhea, change in bowel habits, loss of appetite, bloody stools.  GU: No dysuria, change in color of urine, urgency or frequency.  Neuro: No dizziness or lightheadedness.  Psych: No depression  or anxiety. Mood stable.     Physical Exam:  BP (!) 140/70 (BP Location: Left Arm, Patient Position: Sitting, Cuff Size: Normal)   Pulse (!) 54   Temp 97.6 F (36.4 C) (Temporal)   Ht 5\' 11"  (1.803 m)   Wt 260 lb (117.9 kg)   SpO2 97%   BMI 36.26 kg/m   GEN: Pleasant, interactive, well-appearing; obese; in no acute distress. HEENT:  Normocephalic and atraumatic. PERRLA. Sclera white. Nasal turbinates pink, moist and patent bilaterally. No rhinorrhea present. Oropharynx pink and moist, without exudate or edema. No lesions, ulcerations, or postnasal drip.  NECK:  Supple w/ fair ROM. No JVD present. Normal carotid impulses w/o bruits. Thyroid symmetrical with no goiter or nodules palpated. No lymphadenopathy.   CV: RRR, no m/r/g, no peripheral edema. Pulses intact, +2 bilaterally. No cyanosis, pallor or clubbing. PULMONARY:  Unlabored, regular breathing. Clear bilaterally A&P w/o wheezes/rales/rhonchi. No accessory muscle use.  GI: BS present and normoactive. Soft, non-tender to palpation. No organomegaly or masses detected.  MSK: No erythema, warmth or tenderness. Cap refil <2 sec all extrem. No deformities or joint swelling noted.  Neuro: A/Ox3. No focal  deficits noted.   Skin: Warm, no lesions or rashe Psych: Normal affect and behavior. Judgement and thought content appropriate.     Lab Results:  CBC    Component Value Date/Time   WBC 8.3 10/25/2021 0819   RBC 4.26 10/25/2021 0819   HGB 12.8 (L) 10/25/2021 0819   HGB 12.9 (L) 02/20/2020 0925   HCT 38.4 (L) 10/25/2021 0819   HCT 38.0 02/20/2020 0925   PLT 217 10/25/2021 0819   PLT 269 02/20/2020 0925   MCV 90.1 10/25/2021 0819   MCV 89 02/20/2020 0925   MCV 95 03/02/2014 1416   MCH 30.0 10/25/2021 0819   MCHC 33.3 10/25/2021 0819   RDW 13.4 10/25/2021 0819   RDW 12.1 02/20/2020 0925   RDW 12.8 03/02/2014 1416   LYMPHSABS 1.5 03/13/2020 0044   LYMPHSABS 2.0 10/26/2016 1423   LYMPHSABS 1.5 03/02/2014 1416   MONOABS 1.2 (H) 03/13/2020 0044   MONOABS 1.3 (H) 03/02/2014 1416   EOSABS 0.2 03/13/2020 0044   EOSABS 0.3 10/26/2016 1423   EOSABS 0.3 03/02/2014 1416   BASOSABS 0.1 03/13/2020 0044   BASOSABS 0.0 10/26/2016 1423   BASOSABS 0.1 03/02/2014 1416    BMET    Component Value Date/Time   NA 137 06/01/2022 0848   NA 138 04/28/2021 0932   NA 131 (L) 03/02/2014 1416   K 4.1 06/01/2022 0848   K 4.1 03/02/2014 1416   CL 106 06/01/2022 0848   CL 98 03/02/2014 1416   CO2 23 06/01/2022 0848   CO2 24 03/02/2014 1416   GLUCOSE 143 (H) 06/01/2022 0848   GLUCOSE 374 (H) 03/02/2014 1416   BUN 31 (H) 06/01/2022 0848   BUN 26 04/28/2021 0932   BUN 18 03/02/2014 1416   CREATININE 1.22 06/01/2022 0848   CREATININE 1.27 03/02/2014 1416   CALCIUM 9.2 06/01/2022 0848   CALCIUM 8.7 03/02/2014 1416   GFRNONAA >60 06/01/2022 0848   GFRNONAA >60 03/02/2014 1416   GFRNONAA >60 01/12/2013 0013   GFRAA 53 (L) 02/20/2020 0925   GFRAA >60 03/02/2014 1416   GFRAA >60 01/12/2013 0013    BNP    Component Value Date/Time   BNP 236.0 (H) 08/17/2017 0801     Imaging:  No results found.  Administration History     None  No data to display          No  results found for: "NITRICOXIDE"      Assessment & Plan:   OSA on CPAP Severe OSA on CPAP.  Excellent compliance and control on download today.  Residual AHI 3.1.  Receives benefit from use.  Aware of proper care/use of device.  Safe driving practices reviewed.  Healthy weight loss encouraged.  Patient Instructions  Continue to use CPAP every night, minimum of 4-6 hours a night.  Change equipment every 30 days or as directed by DME. Wash your tubing with warm soap and water daily, hang to dry. Wash humidifier portion weekly.  Be aware of reduced alertness and do not drive or operate heavy machinery if experiencing this or drowsiness.  Exercise encouraged, as tolerated. Avoid or decrease alcohol consumption and medications that make you more sleepy, if possible. Notify if persistent daytime sleepiness occurs even with consistent use of CPAP.   Flu shot today    Follow up in 1 year with Dr. [name] Cotta or NP. If symptoms worsen, please contact office for sooner follow up    I spent 25 minutes of dedicated to the care of this patient on the date of this encounter to include pre-visit review of records, face-to-face time with the patient discussing conditions above, post visit ordering of testing, clinical documentation with the electronic health record, making appropriate referrals as documented, and communicating necessary findings to members of the patients care team.  Noemi Chapel, NP 01/10/2023  Pt aware and understands NP's role.

## 2023-01-10 NOTE — Assessment & Plan Note (Signed)
Severe OSA on CPAP.  Excellent compliance and control on download today.  Residual AHI 3.1.  Receives benefit from use.  Aware of proper care/use of device.  Safe driving practices reviewed.  Healthy weight loss encouraged.  Patient Instructions  Continue to use CPAP every night, minimum of 4-6 hours a night.  Change equipment every 30 days or as directed by DME. Wash your tubing with warm soap and water daily, hang to dry. Wash humidifier portion weekly.  Be aware of reduced alertness and do not drive or operate heavy machinery if experiencing this or drowsiness.  Exercise encouraged, as tolerated. Avoid or decrease alcohol consumption and medications that make you more sleepy, if possible. Notify if persistent daytime sleepiness occurs even with consistent use of CPAP.   Flu shot today    Follow up in 1 year with Dr. [name] Cotta or NP. If symptoms worsen, please contact office for sooner follow up

## 2023-01-21 NOTE — Progress Notes (Unsigned)
Cardiology Office Note  Date:  01/23/2023   ID:  Douglas Hunter Aug 09, 1955, MRN [id number]  PCP:  Marina Goodell, MD   Chief Complaint  Patient presents with   12 month follow up     "Doing well." Medications reviewed by the patient verbally.      HPI:  [name] is a pleasant 67 -year-old gentleman with  CAD,  non-STEMI with admission to the hospital in late April 2012  cardiac catheterization showing severe mid left circumflex disease, subtotally occluded, with collaterals from the RCA to the Left circumflex,  normal ejection fraction  sleep apnea on CPAP episodes of atrial flutter. He reports symptoms dating back into his 30s.  hospitalizations for atrial flutter, 08/11/2012 and on 01/11/2013. medication for cardioversion.  several episodes that broke on their own ablation with Dr. Johney Frame,  for typical atrial flutter, atrial fibrillation. 2014 03/2020: atrial fib, Tikosyn loading,  Atrial fibrillation ablation August 2023 CHA2DS2-VASc Score of 4. 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, managing the farm   Lab work reviewed A1c 8.2 Total cholesterol 122 LDL 61  EKG personally reviewed by myself on todays visit EKG Interpretation Date/Time:  Monday January 23 2023 08:24:29 EDT Ventricular Rate:  55 PR Interval:  156 QRS Duration:  134 QT Interval:  476 QTC Calculation: 455 R Axis:   -29  Text Interpretation: Sinus bradycardia with Premature supraventricular complexes Right bundle branch block When compared with ECG of 28-Sep-2022 10:42, Premature supraventricular complexes are now Present Confirmed by [name] [phone]) on 01/23/2023 8:41:30 AM   Other past medical history  reviewed  Last seen by atrial fibrillation clinic December 09, 2021 Started having more breakthrough atrial fibrillation episodes despite being on Tikosyn he underwent ablation November 15, 2021 Minus to continue dofetilide 500 twice daily, Xarelto 20 daily  Echocardiogram May 2023 normal ejection fraction 60 to 65% moderately dilated left atrium  Lower back surgery Upper cervical spine surgery Chronic leg weakness  Previous ablation with Dr. Johney Frame,  for typical atrial flutter, atrial fibrillation.   Prior Echocardiogram shows normal LV systolic function, essentially normal study, May 2013   PMH:   has a past medical history of AKI (acute kidney injury) (HCC) (03/13/2020), Atrial flutter (HCC), Coronary artery disease (08/2011), GERD (gastroesophageal reflux disease), History of echocardiogram, Hyperlipidemia, Hypertension, Myocardial infarction [facility]), Paroxysmal atrial fibrillation (HCC), Poorly controlled diabetes mellitus (HCC), RBBB, and Sleep apnea.  PSH:    Past Surgical History:  Procedure Laterality Date   ABLATION  01/29/2013   PVI and CTI by Dr Johney Frame for atrial flutter and paroxysmal atrial fibrillation   ANTERIOR CERVICAL DECOMP/DISCECTOMY FUSION N/A 04/18/2018   Procedure: ANTERIOR CERVICAL DECOMPRESSION/DISCECTOMY FUSION 2 LEVELS C5-7;  Surgeon: [name], MD;  Location: [facility] ORS;  Service: Neurosurgery;  Laterality: N/A;   ATRIAL FIBRILLATION ABLATION N/A 01/29/2013   Procedure: ATRIAL FIBRILLATION ABLATION;  Surgeon: [name], MD;  Location: [facility] CATH LAB;  Service: Cardiovascular;  Laterality: N/A;   ATRIAL FIBRILLATION ABLATION N/A 11/15/2021   Procedure: ATRIAL FIBRILLATION ABLATION;  Surgeon: [name], MD;  Location: [facility] INVASIVE CV LAB;  Service: Cardiovascular;  Laterality: N/A;   CARDIAC CATHETERIZATION  08/03/2011   COLONOSCOPY WITH PROPOFOL N/A 02/02/2016  Procedure: COLONOSCOPY WITH PROPOFOL;  Surgeon: [name], MD;  Location: [facility]  ENDOSCOPY;  Service: Endoscopy;  Laterality: N/A;   LUMBAR LAMINECTOMY/DECOMPRESSION MICRODISCECTOMY N/A 06/07/2017   Procedure: LUMBAR LAMINECTOMY/DECOMPRESSION MICRODISCECTOMY 2 LEVELS-L3-4,L4-5;  Surgeon: [name], MD;  Location: [facility] ORS;  Service: Neurosurgery;  Laterality: N/A;   RIGHT/LEFT HEART CATH AND CORONARY ANGIOGRAPHY N/A 03/06/2020   Procedure: RIGHT/LEFT HEART CATH AND CORONARY ANGIOGRAPHY;  Surgeon: [name], MD;  Location: [facility] INVASIVE CV LAB;  Service: Cardiovascular;  Laterality: N/A;   TEE WITHOUT CARDIOVERSION N/A 01/28/2013   Procedure: TRANSESOPHAGEAL ECHOCARDIOGRAM (TEE);  Surgeon: [name], MD;  Location: [facility] ENDOSCOPY;  Service: Cardiovascular;  Laterality: N/A;   Current Outpatient Medications on File Prior to Visit  Medication Sig Dispense Refill   acetaminophen (TYLENOL) 500 MG tablet Take 500 mg by mouth 4 (four) times daily as needed for mild pain or moderate pain.     amLODipine (NORVASC) 5 MG tablet Take 1 tablet (5 mg total) by mouth daily. 180 tablet 3   atorvastatin (LIPITOR) 80 MG tablet Take 80 mg by mouth at bedtime.     benazepril (LOTENSIN) 40 MG tablet TAKE 1 TABLET BY MOUTH ONCE DAILY 90 tablet 3   cholecalciferol (VITAMIN D3) 25 MCG (1000 UNIT) tablet Take 1,000 Units by mouth daily.     Continuous Blood Gluc Sensor (FREESTYLE LIBRE 14 DAY SENSOR) MISC SMARTSIG:1 Kit(s) Topical Every 2 Weeks     dofetilide (TIKOSYN) 500 MCG capsule TAKE 1 CAPSULE BY MOUTH TWICE DAILY 180 capsule 3   ezetimibe (ZETIA) 10 MG tablet TAKE 1 TABLET BY MOUTH ONCE DAILY 90 tablet 1   furosemide (LASIX) 20 MG tablet TAKE 1 TABLET BY MOUTH TWICE DAILY AS NEEDED 180 tablet 0   insulin glargine (LANTUS) 100 UNIT/ML injection Inject 35 Units into the skin 2 (two) times daily.     insulin lispro (HUMALOG) 100 UNIT/ML KiwkPen Inject 16-22 Units into the skin 2 (two) times daily. Sliding Scale     INSULIN SYRINGE 1CC/29G 29G X 1/2" 1 ML MISC USE AS DIRECTED  100 each PRN   JARDIANCE 25 MG TABS tablet Take 25 mg by mouth daily.     metoprolol succinate (TOPROL-XL) 25 MG 24 hr tablet TAKE 1 TABLET BY MOUTH ONCE DAILY 90 tablet 0   nitroGLYCERIN (NITROSTAT) 0.4 MG SL tablet DISSOLVE 1 TABLET UNDER THE TONGUE EVERY 5 MINUTES AS NEEDED FOR CHEST PAIN 25 tablet 0   ONE TOUCH ULTRA TEST test strip TEST THREE TIMES A DAY AS DIRECTED. 100 each 12   rivaroxaban (XARELTO) 20 MG TABS tablet Take 1 tablet (20 mg total) by mouth daily with supper. 28 tablet 0   sildenafil (REVATIO) 20 MG tablet Take 1 tablet (20 mg total) by mouth 3 (three) times daily as needed. 90 tablet 3   Current Facility-Administered Medications on File Prior to Visit  Medication Dose Route Frequency Provider Last Rate Last Admin   sodium chloride flush (NS) 0.9 % injection 3 mL  3 mL Intravenous Q12H Visser, Jacquelyn D, PA-C         Allergies:   Patient has no known allergies.   Social History:  The patient  reports that he quit smoking about 15 years ago. His smoking use included cigarettes. He started smoking about 50 years ago. He has a 52.5 pack-year smoking history. He has never used smokeless tobacco. He reports that he does not currently use alcohol. He reports that he does not use drugs.  Family History:   family history includes Cancer in his father; Diabetes in his son; Heart attack in his maternal uncle; Heart attack (age of onset: 68) in his father; Heart disease in his mother and paternal grandfather; Stroke in his mother.    Review of Systems: Review of Systems  Constitutional: Negative.   HENT: Negative.    Respiratory: Negative.    Cardiovascular: Negative.   Gastrointestinal: Negative.   Musculoskeletal: Negative.   Neurological: Negative.   Psychiatric/Behavioral: Negative.    All other systems reviewed and are negative.   PHYSICAL EXAM: VS:  BP (!) 130/58 (BP Location: Left Arm, Patient Position: Sitting, Cuff Size: Normal)   Pulse (!) 55   Ht 5\' 11"   (1.803 m)   Wt 258 lb 6 oz (117.2 kg)   SpO2 95%   BMI 36.04 kg/m  , BMI Body mass index is 36.04 kg/m. Constitutional:  oriented to person, place, and time. No distress.  HENT:  Head: Grossly normal Eyes:  no discharge. No scleral icterus.  Neck: No JVD, no carotid bruits  Cardiovascular: Regular rate and rhythm, no murmurs appreciated Pulmonary/Chest: Clear to auscultation bilaterally, no wheezes or rails Abdominal: Soft.  no distension.  no tenderness.  Musculoskeletal: Normal range of motion Neurological:  normal muscle tone. Coordination normal. No atrophy Skin: Skin warm and dry Psychiatric: normal affect, pleasant   Recent Labs: 06/01/2022: BUN 31; Creatinine, Ser 1.22; Magnesium 1.9; Potassium 4.1; Sodium 137    Lipid Panel Lab Results  Component Value Date   CHOL 162 01/13/2015   HDL 47 01/12/2013   LDLCALC 49 01/12/2013   TRIG 62 01/13/2015    Wt Readings from Last 3 Encounters:  01/23/23 258 lb 6 oz (117.2 kg)  01/10/23 260 lb (117.9 kg)  09/28/22 259 lb (117.5 kg)     ASSESSMENT AND PLAN:  Paroxysmal atrial fibrillation [facility])  ablation August 2023, successful compliant with his xarelto On tykosin/metoprolol Maintaining normal sinus rhythm, no breakthrough arrhythmia  Coronary artery disease involving native coronary artery of native heart without angina pectoris Currently with no symptoms of angina. No further workup at this time. Continue current medication regimen. Stressed importance of better diabetes control, through weight loss and exercise  Essential hypertension Blood pressure is well controlled on today's visit. No changes made to the medications. We will check BMP today as he is taking Lasix twice daily not as needed  Mixed hyperlipidemia Cholesterol is at goal on the current lipid regimen. No changes to the medications were made.  Type 2 diabetes mellitus with other circulatory complication, with long-term current use of insulin  (HCC) Long history of poorly controlled diabetes Stressed importance of working with endocrinology  A1c 8.2, on Jardiance   Orders Placed This Encounter  Procedures   EKG 12-Lead     Signed, Dossie Arbour, M.D., Ph.D. 01/23/2023  [facility] [place], [place] [phone]

## 2023-01-23 ENCOUNTER — Encounter: Payer: Self-pay | Admitting: Cardiovascular Disease

## 2023-01-23 ENCOUNTER — Ambulatory Visit: Payer: Medicare Other | Attending: Cardiovascular Disease | Admitting: Cardiovascular Disease

## 2023-01-23 VITALS — BP 130/58 | HR 55 | Ht 71.0 in | Wt 258.4 lb

## 2023-01-23 DIAGNOSIS — I480 Paroxysmal atrial fibrillation: Secondary | ICD-10-CM | POA: Diagnosis not present

## 2023-01-23 DIAGNOSIS — I25118 Atherosclerotic heart disease of native coronary artery with other forms of angina pectoris: Secondary | ICD-10-CM

## 2023-01-23 DIAGNOSIS — E782 Mixed hyperlipidemia: Secondary | ICD-10-CM

## 2023-01-23 DIAGNOSIS — Z79899 Other long term (current) drug therapy: Secondary | ICD-10-CM

## 2023-01-23 DIAGNOSIS — I483 Typical atrial flutter: Secondary | ICD-10-CM | POA: Diagnosis not present

## 2023-01-23 DIAGNOSIS — I4819 Other persistent atrial fibrillation: Secondary | ICD-10-CM

## 2023-01-23 DIAGNOSIS — I10 Essential (primary) hypertension: Secondary | ICD-10-CM | POA: Diagnosis not present

## 2023-01-23 DIAGNOSIS — G4733 Obstructive sleep apnea (adult) (pediatric): Secondary | ICD-10-CM

## 2023-01-23 DIAGNOSIS — E0590 Thyrotoxicosis, unspecified without thyrotoxic crisis or storm: Secondary | ICD-10-CM

## 2023-01-23 NOTE — Patient Instructions (Signed)
Medication Instructions:  No changes  If you need a refill on your cardiac medications before your next appointment, please call your pharmacy.   Lab work: [employer] today  Testing/Procedures: No new testing needed  Follow-Up: At [facility], you and your health needs are our priority.  As part of our continuing mission to provide you with exceptional heart care, we have created designated Provider Care Teams.  These Care Teams include your primary Cardiologist (physician) and Advanced Practice Providers (APPs -  Physician Assistants and Nurse Practitioners) who all work together to provide you with the care you need, when you need it.  You will need a follow up appointment in 12 months  Providers on your designated Care Team:   Murray Hodgkins, NP Christell Faith, PA-C Cadence Kathlen Mody, [place]  COVID-19 Vaccine Information can be found at: [email or web address] For questions related to vaccine distribution or appointments, please email [email or web address] or call [phone].

## 2023-01-24 LAB — BASIC METABOLIC PANEL
BUN/Creatinine Ratio: 21 (ref 10–24)
BUN: 31 mg/dL — ABNORMAL HIGH (ref 8–27)
CO2: 17 mmol/L — ABNORMAL LOW (ref 20–29)
Calcium: 9.3 mg/dL (ref 8.6–10.2)
Chloride: 99 mmol/L (ref 96–106)
Creatinine, Ser: 1.51 mg/dL — ABNORMAL HIGH (ref 0.76–1.27)
Glucose: 253 mg/dL — ABNORMAL HIGH (ref 70–99)
Potassium: 5.5 mmol/L — ABNORMAL HIGH (ref 3.5–5.2)
Sodium: 134 mmol/L (ref 134–144)
eGFR: 50 mL/min/{1.73_m2} — ABNORMAL LOW (ref >59)

## 2023-01-25 ENCOUNTER — Telehealth: Payer: Self-pay | Admitting: Cardiovascular Disease

## 2023-01-25 NOTE — Telephone Encounter (Signed)
Pre-operative Risk Assessment    Patient Name: Douglas Hunter  DOB: Apr 15, 1955 MRN: [id number]     Request for Surgical Clearance    Procedure:  Lower left wisdom tooth pulled out  Date of Surgery:  Clearance TBD                                 Surgeon:  not indicated Surgeon's Group or Practice Name:  [facility] Phone number:  [phone] Fax number:  [phone]   Type of Clearance Requested:  Pharmacy, Xarelto, stop prior to procedure    Type of Anesthesia:  Not Indicated   Additional requests/questions:    Signed, Narda Amber   01/25/2023, 2:27 PM

## 2023-01-25 NOTE — Telephone Encounter (Signed)
     Primary Cardiologist: [name], MD  Chart reviewed as part of pre-operative protocol coverage. Given past medical history and time since last visit, based on ACC/AHA guidelines, Douglas Hunter would be at acceptable risk for the planned procedure without further cardiovascular testing.   Patient with diagnosis of afib on Xarelto for anticoagulation.     Procedure: lower left wisdom tooth extraction Date of procedure: TBD   CHA2DS2-VASc Score = 4  This indicates a 4.8% annual risk of stroke. The patient's score is based upon: CHF History: 0 HTN History: 1 Diabetes History: 1 Stroke History: 0 Vascular Disease History: 1 Age Score: 1 Gender Score: 0   CrCl 65mL/min using adjusted body weight Platelet count 217K   Patient does not require pre-op antibiotics for dental procedure.   Per office protocol, patient can hold Xarelto for 1 day prior to procedure.  I will route this recommendation to the requesting party via Epic fax function and remove from pre-op pool.  Please call with questions.  Douglas Hunter. Douglas Reth NP-C     01/25/2023, 3:43 PM [facility] [address] Office [phone] Fax [phone]

## 2023-01-25 NOTE — Telephone Encounter (Signed)
Patient with diagnosis of afib on Xarelto for anticoagulation.    Procedure: lower left wisdom tooth extraction Date of procedure: TBD  CHA2DS2-VASc Score = 4  This indicates a 4.8% annual risk of stroke. The patient's score is based upon: CHF History: 0 HTN History: 1 Diabetes History: 1 Stroke History: 0 Vascular Disease History: 1 Age Score: 1 Gender Score: 0   CrCl 57mL/min using adjusted body weight Platelet count 217K  Patient does not require pre-op antibiotics for dental procedure.  Per office protocol, patient can hold Xarelto for 1 day prior to procedure.    **This guidance is not considered finalized until pre-operative APP has relayed final recommendations.**

## 2023-01-27 ENCOUNTER — Other Ambulatory Visit: Payer: Self-pay | Admitting: Emergency Medicine

## 2023-01-27 DIAGNOSIS — Z79899 Other long term (current) drug therapy: Secondary | ICD-10-CM

## 2023-01-30 NOTE — Telephone Encounter (Signed)
I will re-fax notes per Edd Fabian, FNP.

## 2023-01-30 NOTE — Telephone Encounter (Signed)
Caller Caryl Asp) is following-up on patient's dental clearance.

## 2023-02-01 NOTE — Telephone Encounter (Signed)
I will re-fax the notes again from Edd Fabian, FNP providing clearance for dental procedure.

## 2023-02-01 NOTE — Telephone Encounter (Signed)
Tresa Endo calling to get update on Clearance. Requesting cb to see if can be emailed [email or web address]

## 2023-02-02 ENCOUNTER — Other Ambulatory Visit: Payer: Self-pay | Admitting: Cardiovascular Disease

## 2023-02-10 ENCOUNTER — Other Ambulatory Visit: Payer: Self-pay | Admitting: Cardiovascular Disease

## 2023-02-17 ENCOUNTER — Encounter: Payer: Self-pay | Admitting: Emergency Medicine

## 2023-02-21 ENCOUNTER — Other Ambulatory Visit: Payer: Self-pay

## 2023-02-21 DIAGNOSIS — Z79899 Other long term (current) drug therapy: Secondary | ICD-10-CM

## 2023-02-22 LAB — BASIC METABOLIC PANEL
BUN/Creatinine Ratio: 21 (ref 10–24)
BUN: 32 mg/dL — ABNORMAL HIGH (ref 8–27)
CO2: 18 mmol/L — ABNORMAL LOW (ref 20–29)
Calcium: 9.3 mg/dL (ref 8.6–10.2)
Chloride: 105 mmol/L (ref 96–106)
Creatinine, Ser: 1.49 mg/dL — ABNORMAL HIGH (ref 0.76–1.27)
Glucose: 175 mg/dL — ABNORMAL HIGH (ref 70–99)
Potassium: 4.9 mmol/L (ref 3.5–5.2)
Sodium: 139 mmol/L (ref 134–144)
eGFR: 51 mL/min/{1.73_m2} — ABNORMAL LOW (ref >59)

## 2023-03-01 ENCOUNTER — Telehealth: Payer: Self-pay

## 2023-03-01 NOTE — Telephone Encounter (Signed)
Patient called in to schedule his colonoscopy.

## 2023-03-02 ENCOUNTER — Other Ambulatory Visit: Payer: Self-pay | Admitting: Cardiovascular Disease

## 2023-03-08 ENCOUNTER — Telehealth: Payer: Self-pay

## 2023-03-08 ENCOUNTER — Telehealth: Payer: Self-pay | Admitting: *Deleted

## 2023-03-08 ENCOUNTER — Other Ambulatory Visit: Payer: Self-pay | Admitting: *Deleted

## 2023-03-08 DIAGNOSIS — Z860100 Personal history of colon polyps, unspecified: Secondary | ICD-10-CM

## 2023-03-08 MED ORDER — PEG 3350-KCL-NABCB-NACL-NASULF 236 G PO SOLR
4000.0000 mL | Freq: Once | ORAL | 0 refills | Status: AC
Start: 2023-03-08 — End: 2023-03-08

## 2023-03-08 NOTE — Telephone Encounter (Signed)
Pharmacy, can you please comment on how long Xarelto can be held for upcoming colonoscopy?  Thank you!

## 2023-03-08 NOTE — Telephone Encounter (Signed)
Gastroenterology Pre-Procedure Review  Request Date: 04/20/2023 Requesting Physician: Dr. Servando Snare  PATIENT REVIEW QUESTIONS: The patient responded to the following health history questions as indicated:    1. Are you having any GI issues? no 2. Do you have a personal history of Polyps? yes (02/02/2016) 3. Do you have a family history of Colon Cancer or Polyps? no 4. Diabetes Mellitus? yes (taking insulin, Jardiance) 5. Joint replacements in the past 12 months?no 6. Major health problems in the past 3 months?no 7. Any artificial heart valves, MVP, or defibrillator?yes (atrial fibrillation)    MEDICATIONS & ALLERGIES:    Patient reports the following regarding taking any anticoagulation/antiplatelet therapy:   Plavix, Coumadin, Eliquis, Xarelto, Lovenox, Pradaxa, Brilinta, or Effient? no Aspirin? no  Patient confirms/reports the following medications:  Current Outpatient Medications  Medication Sig Dispense Refill   acetaminophen (TYLENOL) 500 MG tablet Take 500 mg by mouth 4 (four) times daily as needed for mild pain or moderate pain.     amLODipine (NORVASC) 5 MG tablet Take 1 tablet (5 mg total) by mouth daily. 180 tablet 3   atorvastatin (LIPITOR) 80 MG tablet Take 80 mg by mouth at bedtime.     benazepril (LOTENSIN) 40 MG tablet TAKE 1 TABLET BY MOUTH ONCE DAILY 90 tablet 3   cholecalciferol (VITAMIN D3) 25 MCG (1000 UNIT) tablet Take 1,000 Units by mouth daily.     Continuous Blood Gluc Sensor (FREESTYLE LIBRE 14 DAY SENSOR) MISC SMARTSIG:1 Kit(s) Topical Every 2 Weeks     dofetilide (TIKOSYN) 500 MCG capsule TAKE 1 CAPSULE BY MOUTH TWICE DAILY 180 capsule 3   ezetimibe (ZETIA) 10 MG tablet TAKE 1 TABLET BY MOUTH ONCE DAILY 90 tablet 3   furosemide (LASIX) 20 MG tablet TAKE 1 TABLET BY MOUTH TWICE DAILY AS NEEDED 180 tablet 0   insulin glargine (LANTUS) 100 UNIT/ML injection Inject 35 Units into the skin 2 (two) times daily.     insulin lispro (HUMALOG) 100 UNIT/ML KiwkPen Inject  16-22 Units into the skin 2 (two) times daily. Sliding Scale     INSULIN SYRINGE 1CC/29G 29G X 1/2" 1 ML MISC USE AS DIRECTED 100 each PRN   JARDIANCE 25 MG TABS tablet Take 25 mg by mouth daily.     metoprolol succinate (TOPROL-XL) 25 MG 24 hr tablet TAKE 1 TABLET BY MOUTH ONCE DAILY 90 tablet 3   nitroGLYCERIN (NITROSTAT) 0.4 MG SL tablet DISSOLVE 1 TABLET UNDER THE TONGUE EVERY 5 MINUTES AS NEEDED FOR CHEST PAIN 25 tablet 0   ONE TOUCH ULTRA TEST test strip TEST THREE TIMES A DAY AS DIRECTED. 100 each 12   rivaroxaban (XARELTO) 20 MG TABS tablet Take 1 tablet (20 mg total) by mouth daily with supper. 28 tablet 0   sildenafil (REVATIO) 20 MG tablet Take 1 tablet (20 mg total) by mouth 3 (three) times daily as needed. 90 tablet 3   No current facility-administered medications for this visit.   Facility-Administered Medications Ordered in Other Visits  Medication Dose Route Frequency Provider Last Rate Last Admin   sodium chloride flush (NS) 0.9 % injection 3 mL  3 mL Intravenous Q12H Marisue Ivan D, PA-C        Patient confirms/reports the following allergies:  No Known Allergies  No orders of the defined types were placed in this encounter.   AUTHORIZATION INFORMATION Primary Insurance: 1D#: Group #:  Secondary Insurance: 1D#: Group #:  SCHEDULE INFORMATION: Date: 04/20/2023 Time: Location:  [facility]

## 2023-03-08 NOTE — Telephone Encounter (Signed)
..     Pre-operative Risk Assessment    Patient Name: Douglas Hunter  DOB: [birth year] MRN: [id number]      Request for Surgical Clearance    Procedure:   COLONOSCOPY  Date of Surgery:  Clearance 04/20/23                                 Surgeon:  Loreli Slot Surgeon's Group or Practice Name:  [facility] Phone number:  [phone] Fax number:  [phone]   Type of Clearance Requested:   - Medical  - Pharmacy:  Hold Rivaroxaban (Xarelto)     Type of Anesthesia:  General    Additional requests/questions:   LAST O/V 01/23/23, NO NEW APPT  Signed, Renee Ramus   03/08/2023, 2:28 PM

## 2023-03-09 NOTE — Telephone Encounter (Signed)
Patient with diagnosis of afib on Xarelto  for anticoagulation.    Procedure: COLONOSCOPY  Date of procedure: 04/20/2023   CHA2DS2-VASc Score = 4   This indicates a 4.8% annual risk of stroke. The patient's score is based upon: CHF History: 0 HTN History: 1 Diabetes History: 1 Stroke History: 0 Vascular Disease History: 1 Age Score: 1 Gender Score: 0    CrCl 62 mL/min (Adj BW SrCr 1.49 02/21/2023) Platelet count 217K    Per office protocol, patient can hold Xarelto for 2 days prior to procedure.     **This guidance is not considered finalized until pre-operative APP has relayed final recommendations.**

## 2023-03-09 NOTE — Telephone Encounter (Signed)
     Primary Cardiologist: [name], MD  Chart reviewed as part of pre-operative protocol coverage. Given past medical history and time since last visit, based on ACC/AHA guidelines, Douglas Hunter would be at acceptable risk for the planned procedure without further cardiovascular testing.   Patient with diagnosis of afib on Xarelto  for anticoagulation.     Procedure: COLONOSCOPY  Date of procedure: 04/20/2023     CHA2DS2-VASc Score = 4   This indicates a 4.8% annual risk of stroke. The patient's score is based upon: CHF History: 0 HTN History: 1 Diabetes History: 1 Stroke History: 0 Vascular Disease History: 1 Age Score: 1 Gender Score: 0     CrCl 62 mL/min (Adj BW SrCr 1.49 02/21/2023) Platelet count 217K       Per office protocol, patient can hold Xarelto for 2 days prior to procedure.    I will route this recommendation to the requesting party via Epic fax function and remove from pre-op pool.  Please call with questions.  Thomasene Ripple. Irine Heminger NP-C     03/09/2023, 3:44 PM [facility] [address] Office [phone] Fax [phone]

## 2023-03-14 ENCOUNTER — Telehealth: Payer: Self-pay | Admitting: *Deleted

## 2023-03-14 NOTE — Telephone Encounter (Signed)
Per office protocol, patient can hold Xarelto for 2 days prior to procedure.  Will call patient on Monday, 04/17/2023 to notify patient.

## 2023-03-14 NOTE — Telephone Encounter (Signed)
Received fax from cardiology  03/09/23  3:45 PM  Note  Primary Cardiologist: [name], MD   Chart reviewed as part of pre-operative protocol coverage. Given past medical history and time since last visit, based on ACC/AHA guidelines, ALESSIO MCQUILLIN would be at acceptable risk for the planned procedure without further cardiovascular testing.     Patient with diagnosis of afib on Xarelto  for anticoagulation.     Procedure: COLONOSCOPY  Date of procedure: 04/20/2023     CHA2DS2-VASc Score = 4   This indicates a 4.8% annual risk of stroke. The patient's score is based upon: CHF History: 0 HTN History: 1 Diabetes History: 1 Stroke History: 0 Vascular Disease History: 1 Age Score: 1 Gender Score: 0     CrCl 62 mL/min (Adj BW SrCr 1.49 02/21/2023) Platelet count 217K     Per office protocol, patient can hold Xarelto for 2 days prior to procedure.     I will route this recommendation to the requesting party via Epic fax function and remove from pre-op pool.   Please call with questions.   Thomasene Ripple. Cleaver NP-C       03/09/2023, 3:44 PM [facility] [address] Office [phone] Fax [phone]

## 2023-03-27 ENCOUNTER — Other Ambulatory Visit: Payer: Self-pay | Admitting: Cardiology

## 2023-04-20 ENCOUNTER — Ambulatory Visit: Payer: Medicare Other | Admitting: Certified Registered Nurse Anesthetist

## 2023-04-20 ENCOUNTER — Ambulatory Visit
Admission: RE | Admit: 2023-04-20 | Discharge: 2023-04-20 | Disposition: A | Discharge status: Home / Self Care | Payer: Medicare Other | Source: Ambulatory Visit | Attending: Gastroenterology | Admitting: Gastroenterology

## 2023-04-20 ENCOUNTER — Encounter: Payer: Self-pay | Admitting: Gastroenterology

## 2023-04-20 ENCOUNTER — Other Ambulatory Visit: Payer: Self-pay

## 2023-04-20 ENCOUNTER — Encounter
Admission: RE | Disposition: A | Discharge status: Home / Self Care | Payer: Self-pay | Source: Ambulatory Visit | Attending: Gastroenterology

## 2023-04-20 DIAGNOSIS — I252 Old myocardial infarction: Secondary | ICD-10-CM | POA: Diagnosis not present

## 2023-04-20 DIAGNOSIS — Z1211 Encounter for screening for malignant neoplasm of colon: Secondary | ICD-10-CM | POA: Diagnosis present

## 2023-04-20 DIAGNOSIS — Z794 Long term (current) use of insulin: Secondary | ICD-10-CM | POA: Insufficient documentation

## 2023-04-20 DIAGNOSIS — G4730 Sleep apnea, unspecified: Secondary | ICD-10-CM | POA: Insufficient documentation

## 2023-04-20 DIAGNOSIS — Z87891 Personal history of nicotine dependence: Secondary | ICD-10-CM | POA: Diagnosis not present

## 2023-04-20 DIAGNOSIS — Z860100 Personal history of colon polyps, unspecified: Secondary | ICD-10-CM

## 2023-04-20 DIAGNOSIS — E1159 Type 2 diabetes mellitus with other circulatory complications: Secondary | ICD-10-CM | POA: Diagnosis not present

## 2023-04-20 DIAGNOSIS — K5730 Diverticulosis of large intestine without perforation or abscess without bleeding: Secondary | ICD-10-CM | POA: Insufficient documentation

## 2023-04-20 DIAGNOSIS — K635 Polyp of colon: Secondary | ICD-10-CM | POA: Insufficient documentation

## 2023-04-20 DIAGNOSIS — Z7901 Long term (current) use of anticoagulants: Secondary | ICD-10-CM | POA: Diagnosis not present

## 2023-04-20 DIAGNOSIS — Z7984 Long term (current) use of oral hypoglycemic drugs: Secondary | ICD-10-CM | POA: Diagnosis not present

## 2023-04-20 DIAGNOSIS — I10 Essential (primary) hypertension: Secondary | ICD-10-CM | POA: Diagnosis not present

## 2023-04-20 DIAGNOSIS — I2510 Atherosclerotic heart disease of native coronary artery without angina pectoris: Secondary | ICD-10-CM | POA: Diagnosis not present

## 2023-04-20 DIAGNOSIS — I480 Paroxysmal atrial fibrillation: Secondary | ICD-10-CM | POA: Insufficient documentation

## 2023-04-20 HISTORY — PX: COLONOSCOPY WITH PROPOFOL: SHX5780

## 2023-04-20 HISTORY — PX: POLYPECTOMY: SHX5525

## 2023-04-20 LAB — GLUCOSE, CAPILLARY: Glucose-Capillary: 245 mg/dL — ABNORMAL HIGH (ref 70–99)

## 2023-04-20 SURGERY — COLONOSCOPY WITH PROPOFOL
Anesthesia: General

## 2023-04-20 MED ORDER — PROPOFOL 10 MG/ML IV BOLUS
INTRAVENOUS | Status: DC | PRN
  Administered 2023-04-20: 60 mg via INTRAVENOUS

## 2023-04-20 MED ORDER — LIDOCAINE HCL (PF) 2 % IJ SOLN
INTRAMUSCULAR | Status: AC
  Filled 2023-04-20: qty 5

## 2023-04-20 MED ORDER — PROPOFOL 500 MG/50ML IV EMUL
INTRAVENOUS | Status: DC | PRN
  Administered 2023-04-20: 140 ug/kg/min via INTRAVENOUS

## 2023-04-20 MED ORDER — SODIUM CHLORIDE 0.9 % IV SOLN: INTRAVENOUS | Status: DC

## 2023-04-20 NOTE — Anesthesia Preprocedure Evaluation (Signed)
Anesthesia Evaluation  Patient identified by MRN, date of birth, ID band Patient awake    Reviewed: Allergy & Precautions, H&P , NPO status , Patient's Chart, lab work & pertinent test results  Airway Mallampati: III  TM Distance: >3 FB Neck ROM: full    Dental no notable dental hx.    Pulmonary sleep apnea , former smoker   Pulmonary exam normal        Cardiovascular hypertension, (-) angina + CAD and + Past MI  Normal cardiovascular exam+ dysrhythmias Atrial Fibrillation      Neuro/Psych negative neurological ROS  negative psych ROS   GI/Hepatic Neg liver ROS,GERD  ,,  Endo/Other  negative endocrine ROSdiabetes    Renal/GU negative Renal ROS  negative genitourinary   Musculoskeletal   Abdominal Normal abdominal exam  (+)   Peds  Hematology negative hematology ROS (+)   Anesthesia Other Findings Past Medical History: 03/13/2020: AKI (acute kidney injury) (HCC) No date: Atrial flutter (HCC) 08/2011: Coronary artery disease     Comment:  a. NSTEMI 08/2011, LHC w/ severe disease of LCx with good              collaterals, lesion was high-risk for intervention due to              a steep angulation of the LCx coming from the left main               coronary artery as well as heavy calcifications, discused              at cath conference w/ consensus advising med Rx; b.               03/2020 Cath: LM nl, LAD 78m, LCX 100p w/ R->L and L->L               collats. RCA dominant, nl. EF 55%. PA 33/6/19, PCWP 8. No date: GERD (gastroesophageal reflux disease) No date: History of echocardiogram     Comment:  a. 01/2020 Echo: EF 60-65%, no rwma. Sev elev PASP. Mod               dil LA. No date: Hyperlipidemia No date: Hypertension No date: Myocardial infarction [facility]) No date: Paroxysmal atrial fibrillation [facility])     Comment:  a. s/p ablation 01/2013; b CHADS2VASc => 3 (HTN, DM,               vascular disease); c. on  Coumadin; d. 12/2019 Amio d/c'd               2/2 hyperthyroidism. No date: Poorly controlled diabetes mellitus (HCC) No date: RBBB No date: Sleep apnea  Past Surgical History: 01/29/2013: ABLATION     Comment:  PVI and CTI by Dr Johney Frame for atrial flutter and               paroxysmal atrial fibrillation 04/18/2018: ANTERIOR CERVICAL DECOMP/DISCECTOMY FUSION; N/A     Comment:  Procedure: ANTERIOR CERVICAL DECOMPRESSION/DISCECTOMY               FUSION 2 LEVELS C5-7;  Surgeon: [name], MD;                Location: [facility] ORS;  Service: Neurosurgery;  Laterality:               N/A; 01/29/2013: ATRIAL FIBRILLATION ABLATION; N/A     Comment:  Procedure: ATRIAL FIBRILLATION ABLATION;  Surgeon: Fayrene Fearing  D Allred, MD;  Location: [facility] CATH LAB;  Service:               Cardiovascular;  Laterality: N/A; 11/15/2021: ATRIAL FIBRILLATION ABLATION; N/A     Comment:  Procedure: ATRIAL FIBRILLATION ABLATION;  Surgeon:               [name], MD;  Location: [facility] INVASIVE CV LAB;                Service: Cardiovascular;  Laterality: N/A; 08/03/2011: CARDIAC CATHETERIZATION 02/02/2016: COLONOSCOPY WITH PROPOFOL; N/A     Comment:  Procedure: COLONOSCOPY WITH PROPOFOL;  Surgeon: [name], MD;  Location: [facility] ENDOSCOPY;  Service: Endoscopy;              Laterality: N/A; 06/07/2017: LUMBAR LAMINECTOMY/DECOMPRESSION MICRODISCECTOMY; N/A     Comment:  Procedure: LUMBAR LAMINECTOMY/DECOMPRESSION               MICRODISCECTOMY 2 LEVELS-L3-4,L4-5;  Surgeon: [name], MD;  Location: [facility] ORS;  Service: Neurosurgery;              Laterality: N/A; 03/06/2020: RIGHT/LEFT HEART CATH AND CORONARY ANGIOGRAPHY; N/A     Comment:  Procedure: RIGHT/LEFT HEART CATH AND CORONARY               ANGIOGRAPHY;  Surgeon: [name], MD;  Location:               [facility] INVASIVE CV LAB;  Service: Cardiovascular;                Laterality: N/A; 01/28/2013: TEE WITHOUT  CARDIOVERSION; N/A     Comment:  Procedure: TRANSESOPHAGEAL ECHOCARDIOGRAM (TEE);                Surgeon: [name], MD;  Location: [facility] ENDOSCOPY;                Service: Cardiovascular;  Laterality: N/A;  BMI    Body Mass Index: 35.87 kg/m      Reproductive/Obstetrics negative OB ROS                             Anesthesia Physical Anesthesia Plan  ASA: 3  Anesthesia Plan: General   Post-op Pain Management:    Induction:   PONV Risk Score and Plan: Propofol infusion and TIVA  Airway Management Planned:   Additional Equipment:   Intra-op Plan:   Post-operative Plan:   Informed Consent: I have reviewed the patients History and Physical, chart, labs and discussed the procedure including the risks, benefits and alternatives for the proposed anesthesia with the patient or authorized representative who has indicated his/her understanding and acceptance.     Dental Advisory Given  Plan Discussed with: CRNA and Surgeon  Anesthesia Plan Comments:        Anesthesia Quick Evaluation

## 2023-04-20 NOTE — Transfer of Care (Signed)
Immediate Anesthesia Transfer of Care Note  Patient: Douglas Hunter  Procedure(s) Performed: COLONOSCOPY WITH PROPOFOL POLYPECTOMY  Patient Location: PACU  Anesthesia Type:General  Level of Consciousness: drowsy  Airway & Oxygen Therapy: Patient Spontanous Breathing and Patient connected to nasal cannula oxygen  Post-op Assessment: Report given to RN and Post -op Vital signs reviewed and stable  Post vital signs: Reviewed and stable  Last Vitals:  Vitals Value Taken Time  BP 116/81   Temp    Pulse 68 04/20/23 0957  Resp    SpO2 92 % 04/20/23 0957  Vitals shown include unfiled device data.  Last Pain:  Vitals:   04/20/23 0857  TempSrc: Temporal  PainSc: 0-No pain         Complications: No notable events documented.

## 2023-04-20 NOTE — H&P (Signed)
[name], MD [facility] [address], [address] [place], [place] [address] [phone] Fax : [phone]  Primary Care Physician:  Marina Goodell, MD Primary Gastroenterologist:  Dr. Servando Snare  Pre-Procedure History & Physical: HPI:  Douglas Hunter is a 68 y.o. male is here for an colonoscopy.   Past Medical History:  Diagnosis Date   AKI (acute kidney injury) (HCC) 03/13/2020   Atrial flutter (HCC)    Coronary artery disease 08/2011   a. NSTEMI 08/2011, LHC w/ severe disease of LCx with good collaterals, lesion was high-risk for intervention due to a steep angulation of the LCx coming from the left main coronary artery as well as heavy calcifications, discused at cath conference w/ consensus advising med Rx; b. 03/2020 Cath: LM nl, LAD 22m, LCX 100p w/ R->L and L->L collats. RCA dominant, nl. EF 55%. PA 33/6/19, PCWP 8.   GERD (gastroesophageal reflux disease)    History of echocardiogram    a. 01/2020 Echo: EF 60-65%, no rwma. Sev elev PASP. Mod dil LA.   Hyperlipidemia    Hypertension    Myocardial infarction [facility])    Paroxysmal atrial fibrillation (HCC)    a. s/p ablation 01/2013; b CHADS2VASc => 3 (HTN, DM, vascular disease); c. on Coumadin; d. 12/2019 Amio d/c'd 2/2 hyperthyroidism.   Poorly controlled diabetes mellitus (HCC)    RBBB    Sleep apnea     Past Surgical History:  Procedure Laterality Date   ABLATION  01/29/2013   PVI and CTI by Dr Johney Frame for atrial flutter and paroxysmal atrial fibrillation   ANTERIOR CERVICAL DECOMP/DISCECTOMY FUSION N/A 04/18/2018   Procedure: ANTERIOR CERVICAL DECOMPRESSION/DISCECTOMY FUSION 2 LEVELS C5-7;  Surgeon: [name], MD;  Location: [facility] ORS;  Service: Neurosurgery;  Laterality: N/A;   ATRIAL FIBRILLATION ABLATION N/A 01/29/2013   Procedure: ATRIAL FIBRILLATION ABLATION;  Surgeon: [name], MD;  Location: [facility] CATH LAB;  Service: Cardiovascular;  Laterality: N/A;   ATRIAL FIBRILLATION ABLATION N/A 11/15/2021    Procedure: ATRIAL FIBRILLATION ABLATION;  Surgeon: [name], MD;  Location: [facility] INVASIVE CV LAB;  Service: Cardiovascular;  Laterality: N/A;   CARDIAC CATHETERIZATION  08/03/2011   COLONOSCOPY WITH PROPOFOL N/A 02/02/2016   Procedure: COLONOSCOPY WITH PROPOFOL;  Surgeon: [name], MD;  Location: [facility] ENDOSCOPY;  Service: Endoscopy;  Laterality: N/A;   LUMBAR LAMINECTOMY/DECOMPRESSION MICRODISCECTOMY N/A 06/07/2017   Procedure: LUMBAR LAMINECTOMY/DECOMPRESSION MICRODISCECTOMY 2 LEVELS-L3-4,L4-5;  Surgeon: [name], MD;  Location: [facility] ORS;  Service: Neurosurgery;  Laterality: N/A;   RIGHT/LEFT HEART CATH AND CORONARY ANGIOGRAPHY N/A 03/06/2020   Procedure: RIGHT/LEFT HEART CATH AND CORONARY ANGIOGRAPHY;  Surgeon: [name], MD;  Location: [facility] INVASIVE CV LAB;  Service: Cardiovascular;  Laterality: N/A;   TEE WITHOUT CARDIOVERSION N/A 01/28/2013   Procedure: TRANSESOPHAGEAL ECHOCARDIOGRAM (TEE);  Surgeon: [name], MD;  Location: [facility] ENDOSCOPY;  Service: Cardiovascular;  Laterality: N/A;    Prior to Admission medications   Medication Sig Start Date End Date Taking? Authorizing Provider  acetaminophen (TYLENOL) 500 MG tablet Take 500 mg by mouth 4 (four) times daily as needed for mild pain or moderate pain.   Yes [provider]  amLODipine (NORVASC) 5 MG tablet Take 1 tablet (5 mg total) by mouth daily. 09/28/22  Yes [name], MD  atorvastatin (LIPITOR) 80 MG tablet Take 80 mg by mouth at bedtime.   Yes [provider]  benazepril (LOTENSIN) 40 MG tablet TAKE 1 TABLET BY MOUTH ONCE DAILY 02/03/23  Yes [name]  J, MD  cholecalciferol (VITAMIN D3) 25 MCG (1000 UNIT) tablet Take 1,000 Units by mouth daily.   Yes [provider]  Continuous Blood Gluc Sensor (FREESTYLE LIBRE 14 DAY SENSOR) MISC SMARTSIG:1 Kit(s) Topical Every 2 Weeks 03/29/20  Yes [provider]  dofetilide (TIKOSYN) 500 MCG capsule Take 1 capsule (500  mcg total) by mouth 2 (two) times daily. Please call [phone] to schedule an appointment for future refills. Thank you. 03/27/23  Yes [name], MD  ezetimibe (ZETIA) 10 MG tablet TAKE 1 TABLET BY MOUTH ONCE DAILY 03/06/23  Yes Gollan, Tollie Pizza, MD  furosemide (LASIX) 20 MG tablet TAKE 1 TABLET BY MOUTH TWICE DAILY AS NEEDED 12/29/22  Yes Gollan, Tollie Pizza, MD  insulin glargine (LANTUS) 100 UNIT/ML injection Inject 35 Units into the skin 2 (two) times daily.   Yes [provider]  insulin lispro (HUMALOG) 100 UNIT/ML KiwkPen Inject 16-22 Units into the skin 2 (two) times daily. Sliding Scale 12/13/17 05/27/28 Yes [provider]  INSULIN SYRINGE 1CC/29G 29G X 1/2" 1 ML MISC USE AS DIRECTED 05/27/15  Yes Gabriel Cirri, NP  JARDIANCE 25 MG TABS tablet Take 25 mg by mouth daily.   Yes [provider]  metoprolol succinate (TOPROL-XL) 25 MG 24 hr tablet TAKE 1 TABLET BY MOUTH ONCE DAILY 02/10/23  Yes Gollan, Tollie Pizza, MD  nitroGLYCERIN (NITROSTAT) 0.4 MG SL tablet DISSOLVE 1 TABLET UNDER THE TONGUE EVERY 5 MINUTES AS NEEDED FOR CHEST PAIN 08/26/20  Yes [name], MD  ONE TOUCH ULTRA TEST test strip TEST THREE TIMES A DAY AS DIRECTED. 02/04/15  Yes Gabriel Cirri, NP  rivaroxaban (XARELTO) 20 MG TABS tablet Take 1 tablet (20 mg total) by mouth daily with supper. 03/18/20  Yes [name], MD  sildenafil (REVATIO) 20 MG tablet Take 1 tablet (20 mg total) by mouth 3 (three) times daily as needed. 06/24/20  Yes [name], MD    Allergies as of 03/08/2023   (No Known Allergies)    Family History  Problem Relation Age of Onset   Stroke Mother    Heart disease Mother        pacemaker   Heart attack Father 59   Cancer Father        throat   Heart attack Maternal Uncle    Heart disease Paternal Grandfather        MI   Diabetes Son     Social History   Socioeconomic History   Marital status: Married    Spouse name: Not on file   Number  of children: Not on file   Years of education: Not on file   Highest education level: Not on file  Occupational History   Not on file  Tobacco Use   Smoking status: Former    Current packs/day: 0.00    Average packs/day: 1.5 packs/day for 35.0 years (52.5 ttl pk-yrs)    Types: Cigarettes    Start date: 01/15/1973    Quit date: 01/16/2008    Years since quitting: 15.2   Smokeless tobacco: Never   Tobacco comments:    Former smoker 12/09/21  Vaping Use   Vaping status: Never Used  Substance and Sexual Activity   Alcohol use: Not Currently    Comment: occasional   Drug use: No   Sexual activity: Yes  Other Topics Concern   Not on file  Social History Narrative   Pt lives in [place] (right outside of [place])  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   Overall Financial Resource Strain (CARDIA)    Difficulty of Paying Living Expenses: Not hard at all  Food Insecurity: No Food Insecurity (01/31/2023)   Received from [facility]   Hunger Vital Sign    Worried About Running Out of Food in the Last Year: Never true    Ran Out of Food in the Last Year: Never true  Transportation Needs: No Transportation Needs (01/31/2023)   Received from [facility]    In the past 12 months, has lack of transportation kept you from medical appointments or from getting medications?: No    Lack of Transportation (Non-Medical): No  Physical Activity: Not on file  Stress: Not on file  Social Connections: Not on file  Intimate Partner Violence: Not on file    Review of Systems: See HPI, otherwise negative ROS  Physical Exam: BP (!) 157/73   Pulse 70   Temp 97.8 F (36.6 C) (Temporal)   Resp 18   Ht 5\' 10"  (1.778 m)   Wt 113.4 kg   SpO2 98%   BMI 35.87 kg/m  General:    Alert,  pleasant and cooperative in NAD Head:  Normocephalic and atraumatic. Neck:  Supple; no masses or thyromegaly. Lungs:  Clear throughout to auscultation.    Heart:  Regular rate and rhythm. Abdomen:  Soft, nontender and nondistended. Normal bowel sounds, without guarding, and without rebound.   Neurologic:  Alert and  oriented x4;  grossly normal neurologically.  Impression/Plan: Douglas Hunter is here for an colonoscopy to be performed for a history of adenomatous polyps on 2017   Risks, benefits, limitations, and alternatives regarding  colonoscopy have been reviewed with the patient.  Questions have been answered.  All parties agreeable.   [name], MD  04/20/2023, 9:18 AM

## 2023-04-20 NOTE — Op Note (Signed)
[facility] Gastroenterology Patient Name: Douglas Hunter Procedure Date: 04/20/2023 9:18 AM MRN: [id number] Account #: [id number] Date of Birth: 1956/02/15 Admit Type: Outpatient Age: 68 Room: [facility] ENDO ROOM 4 Gender: Male Note Status: Finalized Instrument Name: Prentice Docker [id number] Procedure:             Colonoscopy Indications:           High risk colon cancer surveillance: Personal history                         of colonic polyps Providers:             [name] MD, MD Referring MD:          Marina Goodell (Referring MD) Medicines:             Propofol per Anesthesia Complications:         No immediate complications. Procedure:             Pre-Anesthesia Assessment:                        - Prior to the procedure, a History and Physical was                         performed, and patient medications and allergies were                         reviewed. The patient's tolerance of previous                         anesthesia was also reviewed. The risks and benefits                         of the procedure and the sedation options and risks                         were discussed with the patient. All questions were                         answered, and informed consent was obtained. Prior                         Anticoagulants: The patient has taken no anticoagulant                         or antiplatelet agents. ASA Grade Assessment: II - A                         patient with mild systemic disease. After reviewing                         the risks and benefits, the patient was deemed in                         satisfactory condition to undergo the procedure.                        After obtaining informed consent, the colonoscope was  passed under direct vision. Throughout the procedure,                         the patient's blood pressure, pulse, and oxygen                         saturations were monitored continuously. The                          Colonoscope was introduced through the anus and                         advanced to the the cecum, identified by appendiceal                         orifice and ileocecal valve. The colonoscopy was                         performed without difficulty. The patient tolerated                         the procedure well. The quality of the bowel                         preparation was good. Findings:      The perianal and digital rectal examinations were normal.      A 3 mm polyp was found in the ascending colon. The polyp was sessile.       The polyp was removed with a cold snare. Resection and retrieval were       complete.      A 4 mm polyp was found in the transverse colon. The polyp was sessile.       The polyp was removed with a cold snare. Resection and retrieval were       complete.      Two sessile polyps were found in the sigmoid colon. The polyps were 3 to       4 mm in size. These polyps were removed with a cold snare. Resection and       retrieval were complete.      Multiple small-mouthed diverticula were found in the sigmoid colon. Impression:            - One 3 mm polyp in the ascending colon, removed with                         a cold snare. Resected and retrieved.                        - One 4 mm polyp in the transverse colon, removed with                         a cold snare. Resected and retrieved.                        - Two 3 to 4 mm polyps in the sigmoid colon, removed                         with a cold snare. Resected and retrieved.                        -  Diverticulosis in the sigmoid colon. Recommendation:        - Discharge patient to home.                        - Resume previous diet.                        - Continue present medications. Procedure Code(s):     --- Professional ---                        [phone], Colonoscopy, flexible; with removal of                         tumor(s), polyp(s), or other lesion(s) by snare                          technique Diagnosis Code(s):     --- Professional ---                        Z86.010, Personal history of colonic polyps                        D12.5, Benign neoplasm of sigmoid colon CPT copyright 2022 American Medical Association. All rights reserved. The codes documented in this report are preliminary and upon coder review may  be revised to meet current compliance requirements. [name] MD, MD 04/20/2023 9:56:49 AM This report has been signed electronically. Number of Addenda: 0 Note Initiated On: 04/20/2023 9:18 AM Scope Withdrawal Time: 0 hours 8 minutes 30 seconds  Total Procedure Duration: 0 hours 12 minutes 46 seconds  Estimated Blood Loss:  Estimated blood loss: none.      [facility]

## 2023-04-20 NOTE — Anesthesia Procedure Notes (Addendum)
Date/Time: 04/20/2023 9:28 AM  Performed by: Malva Cogan, CRNAPre-anesthesia Checklist: Patient identified, Emergency Drugs available, Suction available, Patient being monitored and Timeout performed Patient Re-evaluated:Patient Re-evaluated prior to induction Oxygen Delivery Method: Nasal cannula Induction Type: IV induction Ventilation: Oral airway inserted - appropriate to patient size Placement Confirmation: CO2 detector and positive ETCO2

## 2023-04-20 NOTE — Anesthesia Postprocedure Evaluation (Signed)
Anesthesia Post Note  Patient: Douglas Hunter  Procedure(s) Performed: COLONOSCOPY WITH PROPOFOL POLYPECTOMY  Patient location during evaluation: Endoscopy Anesthesia Type: General Level of consciousness: awake and alert Pain management: pain level controlled Vital Signs Assessment: post-procedure vital signs reviewed and stable Respiratory status: spontaneous breathing, nonlabored ventilation and respiratory function stable Cardiovascular status: blood pressure returned to baseline and stable Postop Assessment: no apparent nausea or vomiting Anesthetic complications: no   No notable events documented.   Last Vitals:  Vitals:   04/20/23 0957 04/20/23 1007  BP: 118/70 (!) 143/67  Pulse:  73  Resp: 19   Temp: (!) 36.1 C   SpO2:  98%    Last Pain:  Vitals:   04/20/23 1007  TempSrc:   PainSc: 0-No pain                 Foye Deer

## 2023-04-21 ENCOUNTER — Encounter: Payer: Self-pay | Admitting: Gastroenterology

## 2023-04-21 LAB — SURGICAL PATHOLOGY

## 2023-04-25 ENCOUNTER — Encounter: Payer: Self-pay | Admitting: Gastroenterology

## 2023-06-27 NOTE — Progress Notes (Unsigned)
 Electrophysiology Clinic Note    Date:  06/28/2023  Patient ID:  Douglas Hunter, Douglas Hunter February 10, 1956, MRN [id number] PCP:  Marina Goodell, MD  Cardiologist:  [name], MD Electrophysiologist: [name], MD   Discussed the use of AI scribe software for clinical note transcription with the patient, who gave verbal consent to proceed.   Patient Profile    Chief Complaint: AFib, tikosyn follow-up  History of Present Illness: Douglas Hunter is a 68 y.o. male with PMH notable for HTN, persis AFib, CAD, OSA on CPAP, T2DM; seen today for [name], MD for routine electrophysiology followup.  He is s/p AF ablation 01/2023 by Dr. Johney Frame and 11/2021 by Dr. Lalla Brothers.   AFib has been managed with tikosyn. I last saw him 09/2022 at which time he was not having any AFib episodes.   On follow-up today, he had one AF episode about a month ago. It woke him from sleep about 2am, and he thinks it lasted a couple hours. When he woke up later in the morning, he was back in sinus rhythm.  He continues to take tikosyn BID, 7:30a/7:30p uses alarms to help remember. He continues to take xarelto daily, no bleeding concerns.  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 He does have lower extremity edema. He used to take jardiance daily and lasix weekly, but he recently stopped jardiance d/t the cost. Since stopping jardiance, he takes lasix 20mg  daily. Most mornings his edema is gone and it slowly builds throughout the day.  He is working with endocrinologist to obtain a discount care, submitted paperwork earlier this week. He does admit to eating out in restaurants regularly, will try to limit additional sodium in diet.  He uses CPAP nightly, wakes up feeling refreshed in the AM.  He denies chest pain, chest pressure, SOB, LH, dizziness with activity.    Arrhythmia/Device History Amiodarone -stopped 2/2 hyperthyroid Tiksoyn - loaded 2022     ROS:  Please see the history of  present illness. All other systems are reviewed and otherwise negative.    Physical Exam    VS:  BP 132/70 (BP Location: Left Arm, Patient Position: Sitting, Cuff Size: Normal)   Pulse (!) 54   Ht 5\' 11"  (1.803 m)   Wt 265 lb 3.2 oz (120.3 kg)   SpO2 96%   BMI 36.99 kg/m  BMI: Body mass index is 36.99 kg/m.  Wt Readings from Last 3 Encounters:  06/28/23 265 lb 3.2 oz (120.3 kg)  04/20/23 250 lb (113.4 kg)  01/23/23 258 lb 6 oz (117.2 kg)     GEN- The patient is well appearing, alert and oriented x 3 today.   Lungs- Clear to ausculation bilaterally, normal work of breathing.  Heart- Regular rate and rhythm, no murmurs, rubs or gallops Extremities- 1-2+ peripheral edema, warm, dry    Studies Reviewed   Previous EP, cardiology notes.    EKG is ordered. Personal review of EKG from today shows:    EKG Interpretation Date/Time:  Wednesday June 28 2023 09:20:19 EDT Ventricular Rate:  54 PR Interval:  148 QRS Duration:  170 QT Interval:  488 QTC Calculation: 462 R Axis:   2  Text Interpretation: Sinus bradycardia with Premature supraventricular complexes Right bundle branch block Confirmed by Sherie Don [phone]) on 06/28/2023 9:26:50 AM Also confirmed by Sherie Don [phone])  on 06/28/2023 9:55:14 AM    01/23/2023 EKG - SB w PACs at 55bpm; RBBB QRS  QT ; QTC   Cardiac CT, 11/12/2021 1. There is normal pulmonary vein drainage into the left atrium.(3 on the right and 2 on the left)  2. The left atrial appendage is a mixed - chicken wing-Windsock type with ostial size 30 x 23 mm and length 37 mm, Area 49 mm2. There is no thrombus in the left atrial appendage.   3. The esophagus runs in the left atrial midline and is not in the proximity to any of the pulmonary veins.  4. Significant 3-Vessel coronary artery calcification noted. Calcium score = 5070. This was 99th percentile for age and gender matched controls.  TTE, 08/03/2021  1. Left ventricular ejection  fraction, by estimation, is 60 to 65%. The left ventricle has normal function. The left ventricle has no regional wall motion abnormalities. Left ventricular diastolic parameters are consistent with Grade II diastolic dysfunction (pseudonormalization).   2. Right ventricular systolic function is normal. The right ventricular size is normal. There is mildly elevated pulmonary artery systolic pressure. The estimated right ventricular systolic pressure is 38.9 mmHg.   3. Left atrial size was moderately dilated.   4. The mitral valve is normal in structure. No evidence of mitral valve regurgitation. No evidence of mitral stenosis. Moderate mitral annular calcification.   5. The aortic valve was not well visualized. Aortic valve regurgitation is not visualized. No aortic stenosis is present.   6. The inferior vena cava is normal in size with greater than 50% respiratory variability, suggesting right atrial pressure of 3 mmHg.     Assessment and Plan     #) parox AFib #) tikosyn monitoring S/p AF ablation x 2 Very low AF burden, we briefly discussed redo ablation and he is not interested in pursuing at this time given low AF burden QTC stable today at , corrected for RBBB Continue tikosyn BID Update BMP, mag   #) Hypercoag d/t parox afib CHA2DS2-VASc Score = at least 4 [CHF History: 0, HTN History: 1, Diabetes History: 1, Stroke History: 0, Vascular Disease History: 1, Age Score: 1, Gender Score: 0].  Therefore, the patient's annual risk of stroke is 4.8 %.    Stroke ppx - 20mg  xarelto, appropriately dosed No bleeding concerns Update CBC  #) OSA on CPAP Continue nightly usage  #) lower extremity edema Encouraged him to follow-up with endocrine re: jardiance paperwork Continue 20mg  lasix daily until able to restart jardiance Reduce dietary sodium, continue regular physical activity       Current medicines are reviewed at length with the patient today.   The patient does  not have concerns regarding his medicines.  The following changes were made today:  none  Labs/ tests ordered today include:  Orders Placed This Encounter  Procedures   Basic metabolic panel   CBC   Magnesium   EKG 12-Lead     Disposition: Follow up with Dr. Lalla Brothers or EP APP in 6 months    Signed, Sherie Don, NP  06/28/23  9:55 AM  Electrophysiology [facility]

## 2023-06-28 ENCOUNTER — Ambulatory Visit: Attending: Cardiology | Admitting: Cardiology

## 2023-06-28 VITALS — BP 132/70 | HR 54 | Ht 71.0 in | Wt 265.2 lb

## 2023-06-28 DIAGNOSIS — I480 Paroxysmal atrial fibrillation: Secondary | ICD-10-CM | POA: Diagnosis not present

## 2023-06-28 DIAGNOSIS — G4733 Obstructive sleep apnea (adult) (pediatric): Secondary | ICD-10-CM

## 2023-06-28 DIAGNOSIS — D6869 Other thrombophilia: Secondary | ICD-10-CM | POA: Diagnosis not present

## 2023-06-28 DIAGNOSIS — Z5181 Encounter for therapeutic drug level monitoring: Secondary | ICD-10-CM

## 2023-06-28 DIAGNOSIS — Z79899 Other long term (current) drug therapy: Secondary | ICD-10-CM

## 2023-06-28 NOTE — Patient Instructions (Signed)
 Medication Instructions:  The current medical regimen is effective;  continue present plan and medications.  *If you need a refill on your cardiac medications before your next appointment, please call your pharmacy*   Lab Work: Your provider would like for you to have following labs drawn today BMET, CBC, MAG.   If you have labs (blood work) drawn today and your tests are completely normal, you will receive your results only by: MyChart Message (if you have MyChart) OR A paper copy in the mail If you have any lab test that is abnormal or we need to change your treatment, we will call you to review the results.   Follow-Up: At [facility], you and your health needs are our priority.  As part of our continuing mission to provide you with exceptional heart care, we have created designated Provider Care Teams.  These Care Teams include your primary Cardiologist (physician) and Advanced Practice Providers (APPs -  Physician Assistants and Nurse Practitioners) who all work together to provide you with the care you need, when you need it.  We recommend signing up for the patient portal called "MyChart".  Sign up information is provided on this After Visit Summary.  MyChart is used to connect with patients for Virtual Visits (Telemedicine).  Patients are able to view lab/test results, encounter notes, upcoming appointments, etc.  Non-urgent messages can be sent to your provider as well.   To learn more about what you can do with MyChart, go to [email or web address].    Your next appointment:   6 month(s)  Provider:   Steffanie Dunn, MD or Sherie Don, NP

## 2023-06-29 LAB — BASIC METABOLIC PANEL WITH GFR
BUN/Creatinine Ratio: 19 (ref 10–24)
BUN: 26 mg/dL (ref 8–27)
CO2: 18 mmol/L — ABNORMAL LOW (ref 20–29)
Calcium: 8.8 mg/dL (ref 8.6–10.2)
Chloride: 104 mmol/L (ref 96–106)
Creatinine, Ser: 1.4 mg/dL — ABNORMAL HIGH (ref 0.76–1.27)
Glucose: 233 mg/dL — ABNORMAL HIGH (ref 70–99)
Potassium: 4.6 mmol/L (ref 3.5–5.2)
Sodium: 140 mmol/L (ref 134–144)
eGFR: 54 mL/min/{1.73_m2} — ABNORMAL LOW (ref >59)

## 2023-06-29 LAB — CBC
Hematocrit: 41.7 % (ref 37.5–51.0)
Hemoglobin: 13.8 g/dL (ref 13.0–17.7)
MCH: 30.7 pg (ref 26.6–33.0)
MCHC: 33.1 g/dL (ref 31.5–35.7)
MCV: 93 fL (ref 79–97)
Platelets: 232 10*3/uL (ref 150–450)
RBC: 4.49 x10E6/uL (ref 4.14–5.80)
RDW: 12.3 % (ref 11.6–15.4)
WBC: 8 10*3/uL (ref 3.4–10.8)

## 2023-06-29 LAB — MAGNESIUM: Magnesium: 1.9 mg/dL (ref 1.6–2.3)

## 2023-07-24 ENCOUNTER — Other Ambulatory Visit (HOSPITAL_COMMUNITY): Payer: Self-pay

## 2023-07-24 ENCOUNTER — Telehealth: Payer: Self-pay | Admitting: Cardiovascular Disease

## 2023-07-24 MED ORDER — NITROGLYCERIN 0.4 MG SL SUBL
0.4000 mg | SUBLINGUAL_TABLET | SUBLINGUAL | 1 refills | Status: DC | PRN
  Filled 2023-07-24: qty 25, 7d supply, fill #0

## 2023-07-24 NOTE — Telephone Encounter (Signed)
 Okay to refill nitroglycerin ?   Thanks!

## 2023-07-24 NOTE — Telephone Encounter (Signed)
 Refill sent to preferred pharmacy.

## 2023-07-24 NOTE — Telephone Encounter (Signed)
*  STAT* If patient is at the pharmacy, call can be transferred to refill team.   1. Which medications need to be refilled? (please list name of each medication and dose if known)   nitroGLYCERIN  (NITROSTAT ) 0.4 MG SL tablet DISSOLVE 1 TABLET UNDER THE TONGUE EVERY 5 MINUTES AS NEEDED FOR CHEST PAIN    2. Which pharmacy/location (including street and city if local pharmacy) is medication to be sent to? [facility] - [place], [place] - [address] Phone: [phone]  Fax: [phone]     3. Do they need a 30 day or 90 day supply? 30

## 2023-07-28 NOTE — Telephone Encounter (Signed)
 Script was sent to wrong pharmacy [facility] please send to [facility] in [place]

## 2023-07-31 ENCOUNTER — Other Ambulatory Visit (HOSPITAL_COMMUNITY): Payer: Self-pay

## 2023-07-31 MED ORDER — NITROGLYCERIN 0.4 MG SL SUBL
0.4000 mg | SUBLINGUAL_TABLET | SUBLINGUAL | 1 refills | Status: AC | PRN
Start: 2023-07-31 — End: ?

## 2023-07-31 NOTE — Addendum Note (Signed)
 Addended by: Tommie Frame D on: 07/31/2023 07:46 AM   Modules accepted: Orders

## 2023-08-03 NOTE — Telephone Encounter (Signed)
 Patient is following up. He would like to know if refill can be sent in again.

## 2023-09-21 ENCOUNTER — Other Ambulatory Visit: Payer: Self-pay | Admitting: Cardiology

## 2023-10-31 ENCOUNTER — Other Ambulatory Visit: Payer: Self-pay | Admitting: Surgery

## 2023-10-31 DIAGNOSIS — M7582 Other shoulder lesions, left shoulder: Secondary | ICD-10-CM

## 2023-10-31 DIAGNOSIS — M19012 Primary osteoarthritis, left shoulder: Secondary | ICD-10-CM

## 2023-12-06 ENCOUNTER — Ambulatory Visit
Admission: RE | Admit: 2023-12-06 | Discharge: 2023-12-06 | Disposition: A | Discharge status: Home / Self Care | Source: Ambulatory Visit | Attending: Surgery | Admitting: Surgery

## 2023-12-06 DIAGNOSIS — M7582 Other shoulder lesions, left shoulder: Secondary | ICD-10-CM | POA: Insufficient documentation

## 2023-12-06 DIAGNOSIS — M19012 Primary osteoarthritis, left shoulder: Secondary | ICD-10-CM | POA: Insufficient documentation

## 2023-12-21 ENCOUNTER — Other Ambulatory Visit: Payer: Self-pay | Admitting: Cardiology

## 2024-01-03 DIAGNOSIS — Z87898 Personal history of other specified conditions: Secondary | ICD-10-CM

## 2024-01-03 HISTORY — DX: Personal history of other specified conditions: Z87.898

## 2024-01-24 ENCOUNTER — Encounter: Payer: Self-pay | Admitting: Intensive Care

## 2024-01-24 ENCOUNTER — Emergency Department

## 2024-01-24 ENCOUNTER — Other Ambulatory Visit: Payer: Self-pay

## 2024-01-24 ENCOUNTER — Emergency Department
Admission: EM | Admit: 2024-01-24 | Discharge: 2024-01-24 | Disposition: A | Discharge status: Home / Self Care | Attending: Emergency Medicine | Admitting: Emergency Medicine

## 2024-01-24 DIAGNOSIS — R4182 Altered mental status, unspecified: Secondary | ICD-10-CM | POA: Diagnosis present

## 2024-01-24 DIAGNOSIS — G9389 Other specified disorders of brain: Secondary | ICD-10-CM | POA: Diagnosis not present

## 2024-01-24 DIAGNOSIS — R059 Cough, unspecified: Secondary | ICD-10-CM | POA: Diagnosis not present

## 2024-01-24 DIAGNOSIS — R404 Transient alteration of awareness: Secondary | ICD-10-CM | POA: Diagnosis not present

## 2024-01-24 DIAGNOSIS — D72829 Elevated white blood cell count, unspecified: Secondary | ICD-10-CM | POA: Insufficient documentation

## 2024-01-24 DIAGNOSIS — E1165 Type 2 diabetes mellitus with hyperglycemia: Secondary | ICD-10-CM | POA: Diagnosis not present

## 2024-01-24 DIAGNOSIS — R569 Unspecified convulsions: Secondary | ICD-10-CM | POA: Insufficient documentation

## 2024-01-24 LAB — COMPREHENSIVE METABOLIC PANEL WITH GFR
ALT: 19 U/L (ref 0–44)
AST: 28 U/L (ref 15–41)
Albumin: 4 g/dL (ref 3.5–5.0)
Alkaline Phosphatase: 97 U/L (ref 38–126)
Anion gap: 10 (ref 5–15)
BUN: 31 mg/dL — ABNORMAL HIGH (ref 8–23)
CO2: 23 mmol/L (ref 22–32)
Calcium: 8.9 mg/dL (ref 8.9–10.3)
Chloride: 103 mmol/L (ref 98–111)
Creatinine, Ser: 1.43 mg/dL — ABNORMAL HIGH (ref 0.61–1.24)
GFR, Estimated: 53 mL/min — ABNORMAL LOW (ref >60)
Glucose, Bld: 360 mg/dL — ABNORMAL HIGH (ref 70–99)
Potassium: 4.6 mmol/L (ref 3.5–5.1)
Sodium: 136 mmol/L (ref 135–145)
Total Bilirubin: 1.2 mg/dL (ref 0.0–1.2)
Total Protein: 8.6 g/dL — ABNORMAL HIGH (ref 6.5–8.1)

## 2024-01-24 LAB — URINALYSIS, ROUTINE W REFLEX MICROSCOPIC
Bacteria, UA: NONE SEEN
Bilirubin Urine: NEGATIVE
Glucose, UA: >=500 mg/dL — AB
Hgb urine dipstick: NEGATIVE
Ketones, ur: NEGATIVE mg/dL
Leukocytes,Ua: NEGATIVE
Nitrite: NEGATIVE
Protein, ur: NEGATIVE mg/dL
Specific Gravity, Urine: 1.021 (ref 1.005–1.030)
Squamous Epithelial / HPF: 0 /HPF (ref 0–5)
pH: 6 (ref 5.0–8.0)

## 2024-01-24 LAB — CBC
HCT: 45.3 % (ref 39.0–52.0)
Hemoglobin: 14.7 g/dL (ref 13.0–17.0)
MCH: 30.4 pg (ref 26.0–34.0)
MCHC: 32.5 g/dL (ref 30.0–36.0)
MCV: 93.8 fL (ref 80.0–100.0)
Platelets: 296 K/uL (ref 150–400)
RBC: 4.83 MIL/uL (ref 4.22–5.81)
RDW: 13.1 % (ref 11.5–15.5)
WBC: 11.1 K/uL — ABNORMAL HIGH (ref 4.0–10.5)
nRBC: 0 % (ref 0.0–0.2)

## 2024-01-24 LAB — CBG MONITORING, ED
Glucose-Capillary: 312 mg/dL — ABNORMAL HIGH (ref 70–99)
Glucose-Capillary: 170 mg/dL — ABNORMAL HIGH (ref 70–99)

## 2024-01-24 MED ORDER — LEVETIRACETAM 500 MG PO TABS
500.0000 mg | ORAL_TABLET | Freq: Once | ORAL | Status: AC
  Administered 2024-01-24: 500 mg via ORAL
  Filled 2024-01-24: qty 1

## 2024-01-24 MED ORDER — LEVETIRACETAM 500 MG PO TABS: 500.0000 mg | ORAL_TABLET | Freq: Two times a day (BID) | ORAL | 0 refills | Status: DC

## 2024-01-24 MED ORDER — GADOBUTROL 1 MMOL/ML IV SOLN
10.0000 mL | Freq: Once | INTRAVENOUS | Status: AC | PRN
  Administered 2024-01-24: 10 mL via INTRAVENOUS

## 2024-01-24 NOTE — Discharge Instructions (Addendum)
 You were seen in the emergency department today for evaluation after confusion.  Your testing here shows that you do have a mass in your brain with associated swelling that I suspect is the cause of your symptoms.  Based on his location, I am concerned that she may have had a seizure earlier today that caused your episode.  I sent a prescription for antiseizure medicine to your pharmacy.  Please take this as directed.  We spoke with a specialist, Dr. Janjua, at [facility] in [place].  He will see you in clinic tomorrow for follow-up on your brain mass.  They are planning to call you tomorrow morning, but if you have not heard from them, call their office to ask when your appointment time is.  Return to the ER for any new or worsening symptoms including recurrent seizure-like activity, worsening confusion, or any other new or concerning symptoms.

## 2024-01-24 NOTE — ED Provider Notes (Signed)
 [facility] Provider Note    Event Date/Time   First MD Initiated Contact with Patient 01/24/24 1518     (approximate)   History   Altered Mental Status   HPI  Douglas Hunter is a 68 year old male with history of diabetes presenting to the emergency department for evaluation of altered mental status.  Patient reports that he felt not right when he woke up this morning.  Accompanied by son who provides guttural history.  He notes that a family member texted the patient this morning and got an appropriate response around 930a.  Received another text from family member shortly after and had a delayed response time with inappropriate response around 1030.  A family member went to check on the patient and he was confused with difficult to understand speech.  They are unsure exactly how long this lasted for.  Currently, patient reports he feels close to his baseline.  Son notes mild issues with speech, but otherwise feels close to his baseline.  No history of similar.  Patient notes for several months he has had ongoing cough with sinus congestion, recently placed on antibiotics.  Otherwise reports feeling at his baseline recently.     Physical Exam   Triage Vital Signs: ED Triage Vitals  Encounter Vitals Group     BP 01/24/24 1422 (!) 175/66     Girls Systolic BP Percentile --      Girls Diastolic BP Percentile --      Boys Systolic BP Percentile --      Boys Diastolic BP Percentile --      Pulse Rate 01/24/24 1422 72     Resp 01/24/24 1422 16     Temp 01/24/24 1422 97.9 F (36.6 C)     Temp Source 01/24/24 1422 Oral     SpO2 01/24/24 1422 98 %     Weight 01/24/24 1423 258 lb (117 kg)     Height 01/24/24 1423 5' 11 (1.803 m)     Head Circumference --      Peak Flow --      Pain Score --      Pain Loc --      Pain Education --      Exclude from Growth Chart --     Most recent vital signs: Vitals:   01/24/24 1700 01/24/24 1830  BP: (!) 142/90  (!) 151/78  Pulse: 63 72  Resp: 16 (!) 23  Temp:    SpO2: 97% 96%     General: Awake, interactive  CV:  Good peripheral perfusion Resp:  Unlabored respirations, lungs clear to auscultation Abd:  Nondistended Neuro:  Keenly aware, correctly answers month and age, able to blink eyes and squeeze hands, normal horizontal extraocular movements, no visual field loss, normal facial symmetry, no arm or leg motor drift, no limb ataxia, normal sensation, no aphasia, mild dysarthria with sentence repetition noted, no inattention. NIH 1   ED Results / Procedures / Treatments   Labs (all labs ordered are listed, but only abnormal results are displayed) Labs Reviewed  COMPREHENSIVE METABOLIC PANEL WITH GFR - Abnormal; Notable for the following components:      Result Value   Glucose, Bld 360 (*)    BUN 31 (*)    Creatinine, Ser 1.43 (*)    Total Protein 8.6 (*)    GFR, Estimated 53 (*)    All other components within normal limits  CBC - Abnormal; Notable for the following components:  WBC 11.1 (*)    All other components within normal limits  URINALYSIS, ROUTINE W REFLEX MICROSCOPIC - Abnormal; Notable for the following components:   Color, Urine STRAW (*)    APPearance CLEAR (*)    Glucose, UA >=500 (*)    All other components within normal limits  CBG MONITORING, ED - Abnormal; Notable for the following components:   Glucose-Capillary 312 (*)    All other components within normal limits  CBG MONITORING, ED - Abnormal; Notable for the following components:   Glucose-Capillary 170 (*)    All other components within normal limits     EKG EKG independently reviewed and interpreted by myself demonstrates:  EKG demonstrates normal sinus rhythm at a rate of 68, PR 152, QRS 158, QTc 465, right bundle and left anterior fascicular block noted, no STEMI  RADIOLOGY Imaging independently reviewed and interpreted by myself demonstrates:  CXR without focal consolidation CT head with area of  vasogenic edema over the left temporal lobe with concern for underlying mass, recommended MRI brain  Formal Radiology Read:  MR Brain W and Wo Contrast Result Date: 01/24/2024 EXAM: MRI BRAIN WITH AND WITHOUT CONTRAST 01/24/2024 06:00:25 PM TECHNIQUE: Multiplanar multisequence MRI of the head/brain was performed with and without the administration of 10 mL gadobutrol (GADAVIST) 1 MMOL/ML injection 10 mL GADOBUTROL 1 MMOL/ML IV SOLN. COMPARISON: None available. CLINICAL HISTORY: edema and suspected mass on CT. edema and suspected mass on CT FINDINGS: BRAIN AND VENTRICLES: There is a 4.3 x 3.5 x 2.7 cm heterogeneous enhancing mass centered in the left temporal lobe corresponding to findings on same day CT. Surrounding vasogenic edema throughout the left temporal lobe extending partially into the inferior left parietal lobe as well as with extension into the posterior limb of the left internal capsule and the left external capsule. Mass effect and sulcal effacement throughout the left temporal lobe. There is significant effacement of the atrium and posterior body of the left lateral ventricle. There is up to 3 mm rightward midline shift. There are few punctate areas of susceptibility along the medial margin of the temporal lobe mass. Minimal scattered white matter signal abnormality suggestive of chronic microvascular ischemic changes. No acute infarct. No acute intracranial hemorrhage. No hydrocephalus. The sella is unremarkable. Normal flow voids. ORBITS: No acute abnormality. SINUSES: Mild mucosal thickening in the ethmoid sinuses and left maxillary sinus. BONES AND SOFT TISSUES: Normal bone marrow signal and enhancement. No acute soft tissue abnormality. IMPRESSION: 1. Enhancing left temporal lobe mass measuring 4.3 x 3.5 x 2.7 cm with surrounding vasogenic edema, concerning for metastatic disease versus primary CNS neoplasm. 2. Mass effect with significant effacement of the atrium and posterior body of the  left lateral ventricle. 3 mm rightward midline shift. Electronically signed by: Donnice Mania MD 01/24/2024 06:20 PM EDT RP Workstation: [id number]   DG Chest 2 View Result Date: 01/24/2024 CLINICAL DATA:  Cough, altered level of consciousness EXAM: CHEST - 2 VIEW COMPARISON:  03/13/2020 FINDINGS: Frontal and lateral views of the chest demonstrate an unremarkable cardiac silhouette. No acute airspace disease, effusion, or pneumothorax. No acute bony abnormalities. IMPRESSION: 1. No acute intrathoracic process. Electronically Signed   By: Ozell Daring M.D.   On: 01/24/2024 15:16   CT HEAD WO CONTRAST Result Date: 01/24/2024 CLINICAL DATA:  Altered level of consciousness EXAM: CT HEAD WITHOUT CONTRAST TECHNIQUE: Contiguous axial images were obtained from the base of the skull through the vertex without intravenous contrast. RADIATION DOSE REDUCTION: This exam was performed according  to the departmental dose-optimization program which includes automated exposure control, adjustment of the mA and/or kV according to patient size and/or use of iterative reconstruction technique. COMPARISON:  06/28/2009 FINDINGS: Brain: There is extensive vasogenic edema throughout the left temporal lobe, with overlying sulcal effacement and mass effect upon the left lateral ventricle. 3 mm of rightward midline shift noted at the level of the septum pellucidum. There is a 2.4 cm hypodense masslike area within the left temporal lobe image 13/2, which could reflect cystic brain neoplasm. Further evaluation with MRI is recommended. No evidence of acute infarct or hemorrhage. The lateral ventricles and midline structures are otherwise unremarkable. There are no acute extra-axial fluid collections. Vascular: No hyperdense vessel or unexpected calcification. Skull: Normal. Negative for fracture or focal lesion. Sinuses/Orbits: No acute finding. Other: None. IMPRESSION: 1. Large region of vasogenic edema throughout the left temporal  lobe, with mild mass effect and 3 mm of right word midline shift as above. Suspected 2.4 cm cystic mass in the left temporal lobe, for which MRI brain with and without IV contrast is recommended. 2. No evidence of acute infarct or hemorrhage. Critical Value/emergent results were called by telephone at the time of interpretation on 01/24/2024 at 3:14 pm to provider [facility] , who verbally acknowledged these results. Electronically Signed   By: Ozell Daring M.D.   On: 01/24/2024 15:16    PROCEDURES:  Critical Care performed: No  Procedures   MEDICATIONS ORDERED IN ED: Medications  levETIRAcetam (KEPPRA) tablet 500 mg (has no administration in time range)  gadobutrol (GADAVIST) 1 MMOL/ML injection 10 mL (10 mLs Intravenous Contrast Given 01/24/24 1800)     IMPRESSION / MDM / ASSESSMENT AND PLAN / ED COURSE  I reviewed the triage vital signs and the nursing notes.  Differential diagnosis includes, but is not limited to, CVA, TIA the patient presents outside the window without evidence of large vessel occlusion, no indication for code stroke, intracranial bleed, intracranial mass, electrolyte abnormality   Patient's presentation is most consistent with acute presentation with potential threat to life or bodily function.  68 year old male presenting to the ER for evaluation of altered mental status.  Reported significant confusion earlier today, only mild speech impairment noted on my evaluation.  CT head obtained from triage which did note significant vasogenic edema in his left temporal lobe.  MRI ordered to further evaluate.  Labs with reassuring CBC with only mild leukocytosis, CMP with hyperglycemia without evidence of DKA, stable renal impairment, urine without evidence of infection.  CXR without focal consolidation.  MRI resulted redemonstrating significant vasogenic edema now with more clearly identified left temporal lobe mass with 3 mm rightward midline shift.      FINAL  CLINICAL IMPRESSION(S) / ED DIAGNOSES   Final diagnoses:  Transient alteration of awareness  Mass of left temporal lobe  Seizure-like activity (HCC)     Rx / DC Orders   ED Discharge Orders          Ordered    levETIRAcetam (KEPPRA) 500 MG tablet  2 times daily        01/24/24 1943             Note:  This document was prepared using Dragon voice recognition software and may include unintentional dictation errors.   Levander Slate, MD 01/24/24 [phone]

## 2024-01-24 NOTE — ED Triage Notes (Signed)
 Brought in by EMS from home. Patient reports his wife is at the beach and when they talked on the phone he was confused and she sent the neighbor to the house.   Patient reports he has felt altered and not right since awakening this morning. Reports last felt normal yesterday. Has been confused today  C/o headache.   Patient is oriented to self,place, situation. Disoriented to time

## 2024-01-24 NOTE — ED Notes (Signed)
 MRI called to screen patient

## 2024-01-25 ENCOUNTER — Encounter: Payer: Self-pay | Admitting: Neurosurgery

## 2024-01-25 ENCOUNTER — Ambulatory Visit: Admitting: Neurosurgery

## 2024-01-25 VITALS — BP 160/74 | HR 60 | Temp 98.5°F | Ht 71.0 in | Wt 257.2 lb

## 2024-01-25 DIAGNOSIS — Z049 Encounter for examination and observation for unspecified reason: Secondary | ICD-10-CM

## 2024-01-25 DIAGNOSIS — G936 Cerebral edema: Secondary | ICD-10-CM | POA: Diagnosis not present

## 2024-01-25 DIAGNOSIS — D4989 Neoplasm of unspecified behavior of other specified sites: Secondary | ICD-10-CM | POA: Diagnosis not present

## 2024-01-25 MED ORDER — DEXAMETHASONE 4 MG PO TABS: 4.0000 mg | ORAL_TABLET | Freq: Two times a day (BID) | ORAL | 3 refills | Status: DC

## 2024-01-25 NOTE — Patient Instructions (Signed)
 Please see below for information in regards to your upcoming surgery:  Planned surgery: Left Craniotomy for Tumor Removal  Pre-op appointment at [facility] Pre-admit Testing: you will receive a call with a date/time for this appointment. If you are scheduled for an in person appointment, Pre-admit Testing is located at Entrance A. You will enter and check in at patient registration. During this appointment, they will advise you which medications you can take the morning of surgery, and which medications you will need to hold for surgery. Labs (such as blood work, EKG) may be done at your pre-op appointment. You are not required to fast for these labs. Should you need to change your pre-op appointment, please call Pre-admit testing at  [phone].  Blood thinners:  Xarelto :  stop Xarelto  01/26/2024 and do not restart until instructed to do so.    SGLT2 inhibitors should be held for 3 days prior to surgery and include: Empagliflozin (Jardiance)  Common restrictions after surgery: Please avoid lifting weight > 5lbs. You should not drive until you are cleared to do so. Do not wash your hair until your staples/ stitches are removed.    How to contact us :  If you have any questions/concerns before or after surgery, you can reach us  at [phone], or you can send a mychart message. We can be reached by phone or mychart 8am-4pm, Monday-Friday.  Registered Nurse: Tinnie HERO RN-BSN Surgery scheduler/Admin Lead: Rosaline Custard Medical Assistants: Jesslyn DEL CMA, Avelina BIRCH CMA Doctors: Dino Sable MD, Nancyann Burns MD

## 2024-01-25 NOTE — Patient Instructions (Signed)
 Please see below for information in regards to your upcoming surgery:   Planned surgery: LEFT CRANIOTOMY FOR TUMOR RESECTION    Pre-op appointment at [facility] Pre-admit Testing: you will receive a call with a date/time for this appointment. If you are scheduled for an in person appointment, Pre-admit Testing is located at Entrance A. You will enter and check in at patient registration. During this appointment, they will advise you which medications you can take the morning of surgery, and which medications you will need to hold for surgery. Labs (such as blood work, EKG) may be done at your pre-op appointment. You are not required to fast for these labs. Should you need to change your pre-op appointment, please call Pre-admit testing at  [phone].  Blood thinners:   Xarelto :  stop Xarelto  3 days prior  SGLT2 inhibitors should be held for 3 days prior to surgery and include:  Empagliflozin (Jardiance)   DO NOT EAT OR DRINK PAST MIDNIGHT -Do not take insulin  the morning of the procedure.    Common restrictions after surgery: Please avoid lifting weight > 5lbs. You should not drive until you are cleared to do so. Do not wash your hair until your staples/ stitches are removed.    How to contact us :  If you have any questions/concerns before or after surgery, you can reach us  at [phone], or you can send a mychart message. We can be reached by phone or mychart 8am-4pm, Monday-Friday.     If you have FMLA/disability paperwork, please drop it off or fax to [phone]   Registered Nurse: Tinnie HERO RN-BSN Surgery scheduler/Admin Lead: Rosaline Custard Medical Assistants: Jesslyn DEL CMA, Avelina BIRCH CMA Doctors: Dino Sable MD, Nancyann Burns MD

## 2024-01-25 NOTE — Progress Notes (Signed)
 Assessment : Discussed the use of AI scribe software for clinical note transcription with the patient, who gave verbal consent to proceed.  History of Present Illness Douglas Hunter is a 68 year old male with type 2 diabetes and a history of myocardial infarction who presents with episodes of confusion and memory issues.  He has been experiencing episodes of confusion and memory issues, with the first notable incident occurring on Sunday when he repeated the same story four times in twenty minutes. His family has observed repetitive behavior for several months, but it became more concerning recently.  On Tuesday, he went to the beach with his younger son and appeared fine. However, on Wednesday, he had difficulty making decisions and remembering routine activities. At a restaurant, he struggled to decide on his meal and later had trouble articulating his thoughts to his wife over the phone, using nonsensical words. His wife noted that he called her three times with incoherent speech, mentioning 'internet and password' in an irrelevant context. He became agitated during the conversation. His neighbor was called to check on him, and due to his inability to communicate effectively, emergency services were contacted.  He was taken to the emergency room, where he underwent scans. He recalls being transported to the hospital but was unsure of the reasons for the visit. His wife noted that upon arrival at the hospital, he was unable to form coherent sentences, although he could later converse normally but struggled to recall specific names of places.  He has a history of type 2 diabetes since age 51 and a previous myocardial infarction. He is on Xarelto  for blood thinning and takes medication for heart rate control, although he could not recall the name. He also takes diabetes medication twice daily.  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  The patient reports no episodes of loss of consciousness, loss of bladder control, or tongue biting. He is right-handed.    Plan : I reviewed the MRI with him, his wife and kids. 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  We talked about the options of doing nothing, serial imaging, biopsy and surgical resection and I discussed the risks and benefits in layman's terms with them of each of these.  Given the appearance, I recommended that we pursue a resection. I discussed the risks of infection, hemorrhage, stroke resulting in worsening speech impairment, hemiparesis, seizures and death among others.  They requested for us  to proceed. I will start him on Decadron  and made him aware of the glycemic effects of it and to adjust his insulin . Additionally, he is not going to take any more Xarelto  after Friday night. He is going to continue his other medications.   Social History   Socioeconomic History   Marital status: Married    Spouse name: Not on file   Number of children: 2   Years of education: Not on file   Highest education level: Not on file  Occupational History   Not on file  Tobacco Use   Smoking status: Former    Current packs/day: 0.00    Average packs/day: 1.5 packs/day for 35.0 years (52.5 ttl pk-yrs)    Types: Cigarettes    Start date: 01/15/1973    Quit date: 01/16/2008    Years since quitting: 16.0   Smokeless tobacco: Never   Tobacco comments:  Former smoker 12/09/21  Vaping Use   Vaping status: Never Used  Substance and Sexual Activity   Alcohol use: Not Currently    Comment: occasional   Drug use: No   Sexual activity: Yes  Other Topics Concern   Not on file  Social History Narrative   Pt lives in [place] ([place] outside of [place])   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   Overall Financial Resource Strain (CARDIA)    Difficulty of Paying Living Expenses: Not hard at all  Food Insecurity: No Food Insecurity (01/31/2023)   Received from [facility]   Hunger Vital Sign    Within the past 12 months, you worried that your food would run out before you got the money to buy more.: Never true    Within the past 12 months, the food you bought just didn't last and you didn't have money to get more.: Never true  Transportation Needs: No Transportation Needs (01/31/2023)   Received from [facility] Transportation    In the past 12 months, has lack of transportation kept you from medical appointments or from getting medications?: No    Lack of Transportation (Non-Medical): No  Physical Activity: Not on file  Stress: Not on file  Social Connections: Not on file  Intimate Partner Violence: Not on file    Family History  Problem Relation Age of Onset   Stroke Mother    Heart disease Mother        pacemaker   Heart attack Father 73   Cancer Father        throat   Heart attack Maternal Uncle    Heart disease Paternal Grandfather        MI   Diabetes Son     No Known Allergies  Past Medical History:  Diagnosis Date   AKI (acute kidney injury) 03/13/2020   Atrial flutter (HCC)    Coronary artery disease 08/2011   a. NSTEMI 08/2011, LHC w/ severe disease of LCx with good collaterals, lesion was high-risk for intervention due to a steep angulation of the LCx coming from the left main coronary artery as well as heavy calcifications, discused at cath conference w/ consensus advising med Rx; b. 03/2020 Cath: LM nl, LAD 72m, LCX 100p w/ R->L and L->L collats. RCA dominant, nl. EF 55%. PA 33/6/19, PCWP 8.   GERD (gastroesophageal reflux disease)    History of echocardiogram    a. 01/2020 Echo: EF 60-65%, no rwma. Sev elev PASP. Mod dil LA.    Hyperlipidemia    Hypertension    Myocardial infarction [facility])    Paroxysmal atrial fibrillation (HCC)    a. s/p ablation 01/2013; b CHADS2VASc => 3 (HTN, DM, vascular disease); c. on Coumadin ; d. 12/2019 Amio d/c'd 2/2 hyperthyroidism.   Poorly controlled diabetes mellitus (HCC)    RBBB    Sleep apnea     Past Surgical History:  Procedure Laterality Date   ABLATION  01/29/2013   PVI and CTI by Dr Kelsie for atrial flutter and paroxysmal atrial fibrillation   ANTERIOR CERVICAL DECOMP/DISCECTOMY FUSION N/A 04/18/2018   Procedure: ANTERIOR CERVICAL DECOMPRESSION/DISCECTOMY FUSION 2 LEVELS C5-7;  Surgeon: Clois Fret, MD;  Location: [facility] ORS;  Service: Neurosurgery;  Laterality: N/A;   ATRIAL FIBRILLATION ABLATION N/A 01/29/2013  Procedure: ATRIAL FIBRILLATION ABLATION;  Surgeon: Lynwood JONETTA Rakers, MD;  Location: [facility] CATH LAB;  Service: Cardiovascular;  Laterality: N/A;   ATRIAL FIBRILLATION ABLATION N/A 11/15/2021   Procedure: ATRIAL FIBRILLATION ABLATION;  Surgeon: Cindie Ole DASEN, MD;  Location: [facility] INVASIVE CV LAB;  Service: Cardiovascular;  Laterality: N/A;   CARDIAC CATHETERIZATION  08/03/2011   COLONOSCOPY WITH PROPOFOL  N/A 02/02/2016   Procedure: COLONOSCOPY WITH PROPOFOL ;  Surgeon: Rogelia Copping, MD;  Location: [facility] ENDOSCOPY;  Service: Endoscopy;  Laterality: N/A;   COLONOSCOPY WITH PROPOFOL  N/A 04/20/2023   Procedure: COLONOSCOPY WITH PROPOFOL ;  Surgeon: Copping Rogelia, MD;  Location: [facility] ENDOSCOPY;  Service: Endoscopy;  Laterality: N/A;   LUMBAR LAMINECTOMY/DECOMPRESSION MICRODISCECTOMY N/A 06/07/2017   Procedure: LUMBAR LAMINECTOMY/DECOMPRESSION MICRODISCECTOMY 2 LEVELS-L3-4,L4-5;  Surgeon: Clois Fret, MD;  Location: [facility] ORS;  Service: Neurosurgery;  Laterality: N/A;   POLYPECTOMY  04/20/2023   Procedure: POLYPECTOMY;  Surgeon: Copping Rogelia, MD;  Location: [facility] ENDOSCOPY;  Service: Endoscopy;;   RIGHT/LEFT HEART CATH AND CORONARY ANGIOGRAPHY N/A 03/06/2020   Procedure:  RIGHT/LEFT HEART CATH AND CORONARY ANGIOGRAPHY;  Surgeon: Perla Evalene PARAS, MD;  Location: [facility] INVASIVE CV LAB;  Service: Cardiovascular;  Laterality: N/A;   TEE WITHOUT CARDIOVERSION N/A 01/28/2013   Procedure: TRANSESOPHAGEAL ECHOCARDIOGRAM (TEE);  Surgeon: Redell GORMAN Shallow, MD;  Location: [facility] ENDOSCOPY;  Service: Cardiovascular;  Laterality: N/A;     Physical Exam HENT:     Head: Normocephalic.     Nose: Nose normal.  Eyes:     Pupils: Pupils are equal, round, and reactive to light.  Cardiovascular:     Rate and Rhythm: Normal rate.  Pulmonary:     Effort: Pulmonary effort is normal.  Abdominal:     General: Abdomen is flat.  Musculoskeletal:     Cervical back: Normal range of motion.  Neurological:     Mental Status: He is alert.     Cranial Nerves: Cranial nerves 2-12 are intact.     Sensory: Sensation is intact.     Motor: Motor function is intact.     Coordination: Coordination is intact.     Gait: Gait is intact.     Deep Tendon Reflexes:     Reflex Scores:      Bicep reflexes are 1+ on the right side and 1+ on the left side.      Patellar reflexes are 2+ on the right side and 2+ on the left side.       Results for orders placed or performed during the hospital encounter of 01/24/24  MR Brain W and Wo Contrast   Narrative   EXAM: MRI BRAIN WITH AND WITHOUT CONTRAST 01/24/2024 06:00:25 PM  TECHNIQUE: Multiplanar multisequence MRI of the head/brain was performed with and without the administration of 10 mL gadobutrol (GADAVIST) 1 MMOL/ML injection 10 mL GADOBUTROL 1 MMOL/ML IV SOLN.  COMPARISON: None available.  CLINICAL HISTORY: edema and suspected mass on CT. edema and suspected mass on CT  FINDINGS:  BRAIN AND VENTRICLES: There is a 4.3 x 3.5 x 2.7 cm heterogeneous enhancing mass centered in the left temporal lobe corresponding to findings on same day CT. Surrounding vasogenic edema throughout the left temporal lobe extending partially into the  inferior left parietal lobe as well as with extension into the posterior limb of the left internal capsule and the left external capsule. Mass effect and sulcal effacement throughout the left temporal lobe. There is significant effacement of the atrium and posterior body of the left lateral ventricle. There is  up to 3 mm rightward midline shift. There are few punctate areas of susceptibility along the medial margin of the temporal lobe mass. Minimal scattered white matter signal abnormality suggestive of chronic microvascular ischemic changes. No acute infarct. No acute intracranial hemorrhage. No hydrocephalus. The sella is unremarkable. Normal flow voids.  ORBITS: No acute abnormality.  SINUSES: Mild mucosal thickening in the ethmoid sinuses and left maxillary sinus.  BONES AND SOFT TISSUES: Normal bone marrow signal and enhancement. No acute soft tissue abnormality.  IMPRESSION: 1. Enhancing left temporal lobe mass measuring 4.3 x 3.5 x 2.7 cm with surrounding vasogenic edema, concerning for metastatic disease versus primary CNS neoplasm. 2. Mass effect with significant effacement of the atrium and posterior body of the left lateral ventricle. 3 mm rightward midline shift.  Electronically signed by: Donnice Mania MD 01/24/2024 06:20 PM EDT RP Workstation: [id number]   CT HEAD WO CONTRAST   Narrative   CLINICAL DATA:  Altered level of consciousness  EXAM: CT HEAD WITHOUT CONTRAST  TECHNIQUE: Contiguous axial images were obtained from the base of the skull through the vertex without intravenous contrast.  RADIATION DOSE REDUCTION: This exam was performed according to the departmental dose-optimization program which includes automated exposure control, adjustment of the mA and/or kV according to patient size and/or use of iterative reconstruction technique.  COMPARISON:  06/28/2009  FINDINGS: Brain: There is extensive vasogenic edema throughout the left temporal lobe,  with overlying sulcal effacement and mass effect upon the left lateral ventricle. 3 mm of rightward midline shift noted at the level of the septum pellucidum. There is a 2.4 cm hypodense masslike area within the left temporal lobe image 13/2, which could reflect cystic brain neoplasm. Further evaluation with MRI is recommended.  No evidence of acute infarct or hemorrhage. The lateral ventricles and midline structures are otherwise unremarkable. There are no acute extra-axial fluid collections.  Vascular: No hyperdense vessel or unexpected calcification.  Skull: Normal. Negative for fracture or focal lesion.  Sinuses/Orbits: No acute finding.  Other: None.  IMPRESSION: 1. Large region of vasogenic edema throughout the left temporal lobe, with mild mass effect and 3 mm of right word midline shift as above. Suspected 2.4 cm cystic mass in the left temporal lobe, for which MRI brain with and without IV contrast is recommended. 2. No evidence of acute infarct or hemorrhage.  Critical Value/emergent results were called by telephone at the time of interpretation on 01/24/2024 at 3:14 pm to provider [facility] , who verbally acknowledged these results.   Electronically Signed   By: Ozell Daring M.D.   On: 01/24/2024 15:16

## 2024-01-26 ENCOUNTER — Other Ambulatory Visit: Payer: Self-pay

## 2024-01-26 ENCOUNTER — Encounter (HOSPITAL_COMMUNITY): Payer: Self-pay | Admitting: Neurosurgery

## 2024-01-26 DIAGNOSIS — G9389 Other specified disorders of brain: Secondary | ICD-10-CM

## 2024-01-26 NOTE — Progress Notes (Signed)
 PCP - Dr Cheryl Jericho Cardiologist - Dr Ole Holts Endocrinology - Dr Debby Breaker Pulmonology- Dr Carolynne Allan  Chest x-ray - 01/24/24 EKG - 01/24/24 Stress Test - n/a ECHO - 08/03/21 Cardiac Cath - 03/06/20  ICD Pacemaker/Loop - n/a  Sleep Study -  Yes (2018) CPAP - uses nightly  Diabetes Type 2 FreeStyle Libre System #3, sensor on right arm  Hold Jardiance for 72 hours prior to procedure.   Last dose was on 01/25/24.  Do not take Humalog  Insulin  on the morning of surgery unless your CBG is greater than 220 mg/dL.  If your CBG is greater than 220 mg/dL, you may take  of your sliding scale (correction) dose of insulin .    THE MORNING OF SURGERY, take 17 units of Lantus  Insulin .    If your blood sugar is less than 70 mg/dL, you will need to treat for low blood sugar: Treat a low blood sugar (less than 70 mg/dL) with  cup of clear juice (cranberry or apple), 4 glucose tablets, OR glucose gel. Recheck blood sugar in 15 minutes after treatment (to make sure it is greater than 70 mg/dL). If your blood sugar is not greater than 70 mg/dL on recheck, call [phone] for further instructions.  Xarelto  Instructions:  Last dose on 01/26/24 at night.  Aspirin Instructions: n/a  NPO  Anesthesia review: Yes  STOP now taking any Aspirin (unless otherwise instructed by your surgeon), Aleve, Naproxen, Ibuprofen, Motrin, Advil, Goody's, BC's, all herbal medications, fish oil, and all vitamins.   Coronavirus Screening Do you have any of the following symptoms:  Cough Occasional Fever (>100.34F)  yes/no: No Runny nose occasional Sore throat yes/no: No Difficulty breathing/shortness of breath  yes/no: No  Have you traveled in the last 14 days and where? yes/no: No  Patient's wife Douglas Hunter verbalized understanding of instructions that were given via phone.

## 2024-01-26 NOTE — Anesthesia Preprocedure Evaluation (Addendum)
 Anesthesia Evaluation  Patient identified by MRN, date of birth, ID band Patient awake    Reviewed: Allergy & Precautions, NPO status , Patient's Chart, lab work & pertinent test results  Airway Mallampati: II  TM Distance: >3 FB Neck ROM: Full    Dental  (+) Teeth Intact, Dental Advisory Given   Pulmonary sleep apnea , former smoker   breath sounds clear to auscultation       Cardiovascular hypertension, Pt. on medications and Pt. on home beta blockers + CAD and + Past MI  + dysrhythmias Atrial Fibrillation  Rhythm:Regular Rate:Normal  Echo:  1. Left ventricular ejection fraction, by estimation, is 60 to 65%. The  left ventricle has normal function. The left ventricle has no regional  wall motion abnormalities. Left ventricular diastolic parameters are  consistent with Grade II diastolic  dysfunction (pseudonormalization).   2. Right ventricular systolic function is normal. The right ventricular  size is normal. There is mildly elevated pulmonary artery systolic  pressure. The estimated right ventricular systolic pressure is 38.9 mmHg.   3. Left atrial size was moderately dilated.   4. The mitral valve is normal in structure. No evidence of mitral valve  regurgitation. No evidence of mitral stenosis. Moderate mitral annular  calcification.   5. The aortic valve was not well visualized. Aortic valve regurgitation  is not visualized. No aortic stenosis is present.   6. The inferior vena cava is normal in size with greater than 50%  respiratory variability, suggesting right atrial pressure of 3 mmHg.     Neuro/Psych  Neuromuscular disease  negative psych ROS   GI/Hepatic Neg liver ROS,GERD  ,,  Endo/Other  diabetes, Type 2, Insulin  Dependent, Oral Hypoglycemic Agents    Renal/GU Renal disease     Musculoskeletal negative musculoskeletal ROS (+)    Abdominal   Peds  Hematology negative hematology ROS (+)    Anesthesia Other Findings   Reproductive/Obstetrics                              Anesthesia Physical Anesthesia Plan  ASA: 3  Anesthesia Plan: General   Post-op Pain Management: Tylenol  PO (pre-op)*   Induction: Intravenous  PONV Risk Score and Plan: 3 and Ondansetron , Dexamethasone  and Midazolam   Airway Management Planned: Oral ETT  Additional Equipment: Arterial line  Intra-op Plan:   Post-operative Plan: Extubation in OR  Informed Consent: I have reviewed the patients History and Physical, chart, labs and discussed the procedure including the risks, benefits and alternatives for the proposed anesthesia with the patient or authorized representative who has indicated his/her understanding and acceptance.     Dental advisory given  Plan Discussed with: CRNA  Anesthesia Plan Comments: (PAT note written 01/26/2024 by Allison Zelenak, PA-C.  )         Anesthesia Quick Evaluation

## 2024-01-26 NOTE — Progress Notes (Signed)
 Anesthesia Chart Review: Douglas Hunter  Case: [id number] Date/Time: 01/29/24 9287   Procedures:      CRANIOTOMY TUMOR EXCISION (Left) - LEFT TEMPORAL MASS RESCETION     COMPUTER-ASSISTED NAVIGATION, FOR CRANIAL PROCEDURE (Left)   Anesthesia type: General   Diagnosis: Brain mass [G93.89]   Pre-op diagnosis: G93.89 Brain Tumor   [facility]OM [facility] / [facility]   Surgeons: Rosslyn Dino HERO, MD       DISCUSSION: Patient is a 68 year old male scheduled for the above procedure.  History includes former smoker (quit 01/16/08), CAD (NSTEMI 2013, subtotal occlusion LCX with collaterals, medically managed), afib/flutter (s/p ablation 01/29/2013, redo PVI ablation 11/15/2021), OSA, DM2, HTN, HLD.  Last cardiology follow-up was on 06/28/2023 with Riddle, Suzann, NP. Very low AF burden following redo ablation. Continue Tikosyn , Xarelto . Follow-up ~ 6 months planned. Last visit was with Gollan was on 01/23/2023.    Treated for sinusitis 01/20/2024 with doxycyline. Symptoms started on 01/13/2024. No fever. Lungs clear. He feels better now with only occasional cough. Unfortunately since then, he developed AMS and was brought to the ED on 01/24/2024. CXR clear. Head CT showed a large region of vasogenic edema throughout the left temporal lobe, with mild mass effect and 3 mm of rightward midline shift, suspected 2.4 cm cystic mass in the left temporal lobe. MRI recommended and confirmed left temporal mass concerning for metastatic disease versus primary CNS neoplasm. He was started on anti-seizure and next day neurosurgery evaluation arranged.   Seen by Dr. Janua on 01/25/2024.  Management options discussed.  He was started on Decadron  and above procedure planned.  He was advised to hold Xarelto  after 01/26/2024 PM dose.  Last Jardiance 01/25/2024.   Anesthesia team to evaluate on the day of surgery.  VS: Ht 5' 11 (1.803 m)   Wt 117 kg   BMI 35.98 kg/m  BP Readings from Last 3 Encounters:  01/25/24 (!)  160/74  01/24/24 (!) 151/78  06/28/23 132/70   Pulse Readings from Last 3 Encounters:  01/25/24 60  01/24/24 72  06/28/23 (!) 54     PROVIDERS: [name], MD is PCP  Cherilyn Ned, MD is endocrinologist Perla Lye, MD is primary cardiologist Cindie Smalls, MD is EP cardiologist   LABS: For day of surgery as indicated. Most recent results in [facility] include: Lab Results  Component Value Date   WBC 11.1 (H) 01/24/2024   HGB 14.7 01/24/2024   HCT 45.3 01/24/2024   PLT 296 01/24/2024   GLUCOSE 360 (H) 01/24/2024   ALT 19 01/24/2024   AST 28 01/24/2024   NA 136 01/24/2024   K 4.6 01/24/2024   CL 103 01/24/2024   CREATININE 1.43 (H) 01/24/2024   BUN 31 (H) 01/24/2024   CO2 23 01/24/2024   A1c 8.1% on 11/07/2023.   IMAGES: MRI Brain 01/24/2024: IMPRESSION: 1. Enhancing left temporal lobe mass measuring 4.3 x 3.5 x 2.7 cm with surrounding vasogenic edema, concerning for metastatic disease versus primary CNS neoplasm. 2. Mass effect with significant effacement of the atrium and posterior body of the left lateral ventricle. 3 mm rightward midline shift.  CXR 01/24/2024: FINDINGS: Frontal and lateral views of the chest demonstrate an unremarkable cardiac silhouette. No acute airspace disease, effusion, or pneumothorax. No acute bony abnormalities.   IMPRESSION: 1. No acute intrathoracic process.  EKG: 01/24/2024: Normal sinus rhythm with sinus arrhythmia Right bundle branch block Left anterior fascicular block Bifascicular block Abnormal ECG   CV:  CT Cardiac Morph/PV  11/12/2021: IMPRESSION  1. There is normal pulmonary vein drainage into the left atrium.(3 on the right and 2 on the left)  2. The left atrial appendage is a mixed - chicken wing-Windsock type with ostial size 30 x 23 mm and length 37 mm, Area 49 mm2. There is no thrombus in the left atrial appendage.   3. The esophagus runs in the left atrial midline and is not in the proximity to  any of the pulmonary veins.  4. Significant 3-Vessel coronary artery calcification noted. Calcium  score = 5070. This was 99th percentile for age and gender matched controls.    TTE 08/03/2021: IMPRESSIONS   1. Left ventricular ejection fraction, by estimation, is 60 to 65%. The  left ventricle has normal function. The left ventricle has no regional  wall motion abnormalities. Left ventricular diastolic parameters are  consistent with Grade II diastolic  dysfunction (pseudonormalization).   2. Right ventricular systolic function is normal. The right ventricular  size is normal. There is mildly elevated pulmonary artery systolic  pressure. The estimated right ventricular systolic pressure is 38.9 mmHg.   3. Left atrial size was moderately dilated.   4. The mitral valve is normal in structure. No evidence of mitral valve  regurgitation. No evidence of mitral stenosis. Moderate mitral annular  calcification.   5. The aortic valve was not well visualized. Aortic valve regurgitation  is not visualized. No aortic stenosis is present.   6. The inferior vena cava is normal in size with greater than 50%  respiratory variability, suggesting right atrial pressure of 3 mmHg.    Cardiac cath 03/06/2020: Prox LAD lesion is 40% stenosed. Mid LAD lesion is 50% stenosed. Prox Cx to Mid Cx lesion is 100% stenosed. 3rd Mrg lesion is 100% stenosed. The left ventricular systolic function is normal. LV end diastolic pressure is normal. The left ventricular ejection fraction is 50-55% by visual estimate. There is no mitral valve regurgitation. There is mild aortic valve stenosis. 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 left to left, unchanged from 2012, moderate disease mid LAD at the takeoff of diagonal #2, 50%, mild eccentric disease more proximal LAD Medical management recommended Normal right heart pressures  Recommendations:  Etiology of chest  tightness shortness of breath symptoms from stable chronic angina in the setting of above He does have 40 controlled diabetes, long history, strongly recommended he work on his diet, start walking program, get his weight down in effort to get hemoglobin A1c 7 Discussed how lesions above can progress in the setting of poorly controlled diabetes Reports that he is working with endocrinology   Past Medical History:  Diagnosis Date   AKI (acute kidney injury) 03/13/2020   Atrial flutter (HCC)    Coronary artery disease 08/2011   a. NSTEMI 08/2011, LHC w/ severe disease of LCx with good collaterals, lesion was high-risk for intervention due to a steep angulation of the LCx coming from the left main coronary artery as well as heavy calcifications, discused at cath conference w/ consensus advising med Rx; b. 03/2020 Cath: LM nl, LAD 14m, LCX 100p w/ R->L and L->L collats. RCA dominant, nl. EF 55%. PA 33/6/19, PCWP 8.   GERD (gastroesophageal reflux disease)    H/O brain tumor 01/2024   History of echocardiogram    a. 01/2020 Echo: EF 60-65%, no rwma. Sev elev PASP. Mod dil LA.   Hyperlipidemia    Hypertension    Myocardial infarction (HCC)  Paroxysmal atrial fibrillation (HCC)    a. s/p ablation 01/2013; b CHADS2VASc => 3 (HTN, DM, vascular disease); c. on Coumadin ; d. 12/2019 Amio d/c'd 2/2 hyperthyroidism.   Poorly controlled diabetes mellitus (HCC)    RBBB    Sleep apnea    uses nightly    Past Surgical History:  Procedure Laterality Date   ABLATION  01/29/2013   PVI and CTI by Dr Kelsie for atrial flutter and paroxysmal atrial fibrillation   ANTERIOR CERVICAL DECOMP/DISCECTOMY FUSION N/A 04/18/2018   Procedure: ANTERIOR CERVICAL DECOMPRESSION/DISCECTOMY FUSION 2 LEVELS C5-7;  Surgeon: Clois Fret, MD;  Location: [facility] ORS;  Service: Neurosurgery;  Laterality: N/A;   ATRIAL FIBRILLATION ABLATION N/A 01/29/2013   Procedure: ATRIAL FIBRILLATION ABLATION;  Surgeon: Lynwood JONETTA Kelsie, MD;   Location: [facility] CATH LAB;  Service: Cardiovascular;  Laterality: N/A;   ATRIAL FIBRILLATION ABLATION N/A 11/15/2021   Procedure: ATRIAL FIBRILLATION ABLATION;  Surgeon: Cindie Ole DASEN, MD;  Location: [facility] INVASIVE CV LAB;  Service: Cardiovascular;  Laterality: N/A;   CARDIAC CATHETERIZATION  08/03/2011   COLONOSCOPY WITH PROPOFOL  N/A 02/02/2016   Procedure: COLONOSCOPY WITH PROPOFOL ;  Surgeon: Rogelia Copping, MD;  Location: [facility] ENDOSCOPY;  Service: Endoscopy;  Laterality: N/A;   COLONOSCOPY WITH PROPOFOL  N/A 04/20/2023   Procedure: COLONOSCOPY WITH PROPOFOL ;  Surgeon: Copping Rogelia, MD;  Location: [facility] ENDOSCOPY;  Service: Endoscopy;  Laterality: N/A;   LUMBAR LAMINECTOMY/DECOMPRESSION MICRODISCECTOMY N/A 06/07/2017   Procedure: LUMBAR LAMINECTOMY/DECOMPRESSION MICRODISCECTOMY 2 LEVELS-L3-4,L4-5;  Surgeon: Clois Fret, MD;  Location: [facility] ORS;  Service: Neurosurgery;  Laterality: N/A;   POLYPECTOMY  04/20/2023   Procedure: POLYPECTOMY;  Surgeon: Copping Rogelia, MD;  Location: [facility] ENDOSCOPY;  Service: Endoscopy;;   RIGHT/LEFT HEART CATH AND CORONARY ANGIOGRAPHY N/A 03/06/2020   Procedure: RIGHT/LEFT HEART CATH AND CORONARY ANGIOGRAPHY;  Surgeon: Perla Evalene PARAS, MD;  Location: [facility] INVASIVE CV LAB;  Service: Cardiovascular;  Laterality: N/A;   TEE WITHOUT CARDIOVERSION N/A 01/28/2013   Procedure: TRANSESOPHAGEAL ECHOCARDIOGRAM (TEE);  Surgeon: Redell GORMAN Shallow, MD;  Location: [facility] ENDOSCOPY;  Service: Cardiovascular;  Laterality: N/A;    MEDICATIONS: No current facility-administered medications for this encounter.    acetaminophen  (TYLENOL ) 500 MG tablet   amLODipine  (NORVASC ) 5 MG tablet   atorvastatin  (LIPITOR ) 80 MG tablet   benazepril  (LOTENSIN ) 40 MG tablet   cholecalciferol  (VITAMIN D3) 25 MCG (1000 UNIT) tablet   dexamethasone  (DECADRON ) 4 MG tablet   dofetilide  (TIKOSYN ) 500 MCG capsule   doxycycline (VIBRAMYCIN) 100 MG capsule   ezetimibe  (ZETIA ) 10 MG tablet   furosemide  (LASIX ) 20  MG tablet   insulin  glargine (LANTUS ) 100 UNIT/ML injection   insulin  lispro (HUMALOG ) 100 UNIT/ML KiwkPen   JARDIANCE 25 MG TABS tablet   levETIRAcetam (KEPPRA) 500 MG tablet   metoprolol  succinate (TOPROL -XL) 25 MG 24 hr tablet   nitroGLYCERIN  (NITROSTAT ) 0.4 MG SL tablet   OVER THE COUNTER MEDICATION   rivaroxaban  (XARELTO ) 20 MG TABS tablet   Continuous Blood Gluc Sensor (FREESTYLE LIBRE 14 DAY SENSOR) MISC   INSULIN  SYRINGE 1CC/29G 29G X 1/2 1 ML MISC   ONE TOUCH ULTRA TEST test strip    sodium chloride  flush (NS) 0.9 % injection 3 mL    Isaiah Ruder, PA-C Surgical Short Stay/Anesthesiology [facility] Phone [phone] [facility] Phone [phone] 01/26/2024 4:53 PM

## 2024-01-29 ENCOUNTER — Encounter (HOSPITAL_COMMUNITY): Payer: Self-pay | Admitting: Neurosurgery

## 2024-01-29 ENCOUNTER — Inpatient Hospital Stay (HOSPITAL_COMMUNITY): Payer: Self-pay | Admitting: Anesthesiology

## 2024-01-29 ENCOUNTER — Encounter (HOSPITAL_COMMUNITY)
Admission: RE | Disposition: A | Discharge status: Home / Self Care | Payer: Self-pay | Source: Home / Self Care | Attending: Pulmonary Disease

## 2024-01-29 ENCOUNTER — Inpatient Hospital Stay (HOSPITAL_COMMUNITY)

## 2024-01-29 ENCOUNTER — Inpatient Hospital Stay (HOSPITAL_COMMUNITY)
Admission: RE | Admit: 2024-01-29 | Discharge: 2024-01-31 | DRG: 025 | Disposition: A | Discharge status: Home / Self Care | Attending: Pulmonary Disease | Admitting: Pulmonary Disease

## 2024-01-29 DIAGNOSIS — Z8249 Family history of ischemic heart disease and other diseases of the circulatory system: Secondary | ICD-10-CM

## 2024-01-29 DIAGNOSIS — I10 Essential (primary) hypertension: Secondary | ICD-10-CM | POA: Diagnosis not present

## 2024-01-29 DIAGNOSIS — Z808 Family history of malignant neoplasm of other organs or systems: Secondary | ICD-10-CM

## 2024-01-29 DIAGNOSIS — I25119 Atherosclerotic heart disease of native coronary artery with unspecified angina pectoris: Secondary | ICD-10-CM | POA: Diagnosis present

## 2024-01-29 DIAGNOSIS — Z79899 Other long term (current) drug therapy: Secondary | ICD-10-CM | POA: Diagnosis not present

## 2024-01-29 DIAGNOSIS — E119 Type 2 diabetes mellitus without complications: Secondary | ICD-10-CM | POA: Diagnosis not present

## 2024-01-29 DIAGNOSIS — I252 Old myocardial infarction: Secondary | ICD-10-CM | POA: Diagnosis not present

## 2024-01-29 DIAGNOSIS — N1831 Chronic kidney disease, stage 3a: Secondary | ICD-10-CM | POA: Diagnosis present

## 2024-01-29 DIAGNOSIS — K219 Gastro-esophageal reflux disease without esophagitis: Secondary | ICD-10-CM | POA: Diagnosis present

## 2024-01-29 DIAGNOSIS — C719 Malignant neoplasm of brain, unspecified: Secondary | ICD-10-CM | POA: Diagnosis present

## 2024-01-29 DIAGNOSIS — E871 Hypo-osmolality and hyponatremia: Secondary | ICD-10-CM | POA: Diagnosis present

## 2024-01-29 DIAGNOSIS — G936 Cerebral edema: Secondary | ICD-10-CM | POA: Diagnosis present

## 2024-01-29 DIAGNOSIS — Z9989 Dependence on other enabling machines and devices: Secondary | ICD-10-CM | POA: Diagnosis not present

## 2024-01-29 DIAGNOSIS — I129 Hypertensive chronic kidney disease with stage 1 through stage 4 chronic kidney disease, or unspecified chronic kidney disease: Secondary | ICD-10-CM | POA: Diagnosis present

## 2024-01-29 DIAGNOSIS — D72829 Elevated white blood cell count, unspecified: Secondary | ICD-10-CM | POA: Diagnosis not present

## 2024-01-29 DIAGNOSIS — D496 Neoplasm of unspecified behavior of brain: Secondary | ICD-10-CM | POA: Diagnosis present

## 2024-01-29 DIAGNOSIS — Z833 Family history of diabetes mellitus: Secondary | ICD-10-CM

## 2024-01-29 DIAGNOSIS — Z7984 Long term (current) use of oral hypoglycemic drugs: Secondary | ICD-10-CM | POA: Diagnosis not present

## 2024-01-29 DIAGNOSIS — E1122 Type 2 diabetes mellitus with diabetic chronic kidney disease: Secondary | ICD-10-CM | POA: Diagnosis present

## 2024-01-29 DIAGNOSIS — G4733 Obstructive sleep apnea (adult) (pediatric): Secondary | ICD-10-CM | POA: Diagnosis present

## 2024-01-29 DIAGNOSIS — Z7901 Long term (current) use of anticoagulants: Secondary | ICD-10-CM | POA: Diagnosis not present

## 2024-01-29 DIAGNOSIS — Z794 Long term (current) use of insulin: Secondary | ICD-10-CM

## 2024-01-29 DIAGNOSIS — Z823 Family history of stroke: Secondary | ICD-10-CM

## 2024-01-29 DIAGNOSIS — E785 Hyperlipidemia, unspecified: Secondary | ICD-10-CM | POA: Diagnosis present

## 2024-01-29 DIAGNOSIS — D72828 Other elevated white blood cell count: Secondary | ICD-10-CM | POA: Diagnosis present

## 2024-01-29 DIAGNOSIS — G9389 Other specified disorders of brain: Secondary | ICD-10-CM

## 2024-01-29 DIAGNOSIS — I480 Paroxysmal atrial fibrillation: Secondary | ICD-10-CM | POA: Diagnosis present

## 2024-01-29 DIAGNOSIS — N183 Chronic kidney disease, stage 3 unspecified: Secondary | ICD-10-CM | POA: Diagnosis not present

## 2024-01-29 DIAGNOSIS — I2510 Atherosclerotic heart disease of native coronary artery without angina pectoris: Secondary | ICD-10-CM | POA: Diagnosis not present

## 2024-01-29 DIAGNOSIS — Z87891 Personal history of nicotine dependence: Secondary | ICD-10-CM

## 2024-01-29 DIAGNOSIS — Z9889 Other specified postprocedural states: Secondary | ICD-10-CM | POA: Diagnosis not present

## 2024-01-29 DIAGNOSIS — I4891 Unspecified atrial fibrillation: Secondary | ICD-10-CM | POA: Diagnosis not present

## 2024-01-29 HISTORY — PX: APPLICATION OF CRANIAL NAVIGATION: SHX6578

## 2024-01-29 HISTORY — PX: CRANIOTOMY: SHX93

## 2024-01-29 LAB — GLUCOSE, CAPILLARY
Glucose-Capillary: 254 mg/dL — ABNORMAL HIGH (ref 70–99)
Glucose-Capillary: 212 mg/dL — ABNORMAL HIGH (ref 70–99)
Glucose-Capillary: 225 mg/dL — ABNORMAL HIGH (ref 70–99)
Glucose-Capillary: 192 mg/dL — ABNORMAL HIGH (ref 70–99)
Glucose-Capillary: 204 mg/dL — ABNORMAL HIGH (ref 70–99)
Glucose-Capillary: 173 mg/dL — ABNORMAL HIGH (ref 70–99)

## 2024-01-29 LAB — BASIC METABOLIC PANEL WITH GFR
Anion gap: 12 (ref 5–15)
BUN: 42 mg/dL — ABNORMAL HIGH (ref 8–23)
CO2: 16 mmol/L — ABNORMAL LOW (ref 22–32)
Calcium: 8.3 mg/dL — ABNORMAL LOW (ref 8.9–10.3)
Chloride: 104 mmol/L (ref 98–111)
Creatinine, Ser: 1.43 mg/dL — ABNORMAL HIGH (ref 0.61–1.24)
GFR, Estimated: 53 mL/min — ABNORMAL LOW (ref >60)
Glucose, Bld: 190 mg/dL — ABNORMAL HIGH (ref 70–99)
Potassium: 4.5 mmol/L (ref 3.5–5.1)
Sodium: 132 mmol/L — ABNORMAL LOW (ref 135–145)

## 2024-01-29 LAB — POCT I-STAT 7, (LYTES, BLD GAS, ICA,H+H)
Acid-base deficit: 7 mmol/L — ABNORMAL HIGH (ref 0.0–2.0)
Bicarbonate: 18.2 mmol/L — ABNORMAL LOW (ref 20.0–28.0)
Calcium, Ion: 1.23 mmol/L (ref 1.15–1.40)
HCT: 42 % (ref 39.0–52.0)
Hemoglobin: 14.3 g/dL (ref 13.0–17.0)
O2 Saturation: 99 %
Potassium: 4.5 mmol/L (ref 3.5–5.1)
Sodium: 137 mmol/L (ref 135–145)
TCO2: 19 mmol/L — ABNORMAL LOW (ref 22–32)
pCO2 arterial: 37 mmHg (ref 32–48)
pH, Arterial: 7.3 — ABNORMAL LOW (ref 7.35–7.45)
pO2, Arterial: 142 mmHg — ABNORMAL HIGH (ref 83–108)

## 2024-01-29 LAB — TYPE AND SCREEN
ABO/RH(D): O POS
Antibody Screen: NEGATIVE

## 2024-01-29 LAB — CBC
HCT: 42.7 % (ref 39.0–52.0)
Hemoglobin: 14.3 g/dL (ref 13.0–17.0)
MCH: 30.5 pg (ref 26.0–34.0)
MCHC: 33.5 g/dL (ref 30.0–36.0)
MCV: 91 fL (ref 80.0–100.0)
Platelets: 310 K/uL (ref 150–400)
RBC: 4.69 MIL/uL (ref 4.22–5.81)
RDW: 13.2 % (ref 11.5–15.5)
WBC: 16.3 K/uL — ABNORMAL HIGH (ref 4.0–10.5)
nRBC: 0 % (ref 0.0–0.2)

## 2024-01-29 LAB — MAGNESIUM: Magnesium: 1.8 mg/dL (ref 1.7–2.4)

## 2024-01-29 SURGERY — CRANIOTOMY TUMOR EXCISION
Anesthesia: General | Laterality: Left

## 2024-01-29 MED ORDER — ACETAMINOPHEN 325 MG PO TABS: 650.0000 mg | ORAL_TABLET | ORAL | Status: DC | PRN

## 2024-01-29 MED ORDER — DEXAMETHASONE 4 MG PO TABS
4.0000 mg | ORAL_TABLET | Freq: Four times a day (QID) | ORAL | Status: AC
  Administered 2024-01-30 – 2024-01-31 (×4): 4 mg via ORAL
  Filled 2024-01-29 (×4): qty 1

## 2024-01-29 MED ORDER — LEVETIRACETAM 500 MG PO TABS
500.0000 mg | ORAL_TABLET | Freq: Two times a day (BID) | ORAL | Status: DC
  Administered 2024-01-29 – 2024-01-31 (×4): 500 mg via ORAL
  Filled 2024-01-29 (×4): qty 1

## 2024-01-29 MED ORDER — FENTANYL CITRATE (PF) 50 MCG/ML IJ SOSY: 25.0000 ug | PREFILLED_SYRINGE | INTRAMUSCULAR | Status: AC | PRN

## 2024-01-29 MED ORDER — ROCURONIUM BROMIDE 10 MG/ML (PF) SYRINGE
PREFILLED_SYRINGE | INTRAVENOUS | Status: DC | PRN
  Administered 2024-01-29: 40 mg via INTRAVENOUS
  Administered 2024-01-29: 60 mg via INTRAVENOUS
  Administered 2024-01-29 (×2): 10 mg via INTRAVENOUS

## 2024-01-29 MED ORDER — SODIUM CHLORIDE 0.9 % IV SOLN
INTRAVENOUS | Status: DC | PRN
  Administered 2024-01-29: 1000 mg via INTRAVENOUS

## 2024-01-29 MED ORDER — SODIUM CHLORIDE 0.9 % IV SOLN: INTRAVENOUS | Status: DC | PRN

## 2024-01-29 MED ORDER — CEFAZOLIN SODIUM-DEXTROSE 2-4 GM/100ML-% IV SOLN
2.0000 g | Freq: Once | INTRAVENOUS | Status: AC
  Administered 2024-01-29: 2 g via INTRAVENOUS
  Filled 2024-01-29: qty 100

## 2024-01-29 MED ORDER — FENTANYL CITRATE (PF) 100 MCG/2ML IJ SOLN
INTRAMUSCULAR | Status: AC
  Filled 2024-01-29: qty 2

## 2024-01-29 MED ORDER — DEXAMETHASONE SOD PHOSPHATE PF 10 MG/ML IJ SOLN
INTRAMUSCULAR | Status: DC | PRN
  Administered 2024-01-29: 10 mg via INTRAVENOUS

## 2024-01-29 MED ORDER — DROPERIDOL 2.5 MG/ML IJ SOLN: 0.6250 mg | Freq: Once | INTRAMUSCULAR | Status: DC | PRN

## 2024-01-29 MED ORDER — LABETALOL HCL 5 MG/ML IV SOLN
INTRAVENOUS | Status: DC | PRN
Start: 2024-01-29 — End: 2024-01-29
  Administered 2024-01-29 (×2): 5 mg via INTRAVENOUS

## 2024-01-29 MED ORDER — ATORVASTATIN CALCIUM 80 MG PO TABS
80.0000 mg | ORAL_TABLET | Freq: Every day | ORAL | Status: DC
  Administered 2024-01-29 – 2024-01-30 (×2): 80 mg via ORAL
  Filled 2024-01-29 (×2): qty 1

## 2024-01-29 MED ORDER — LEVETIRACETAM 500 MG/5ML IV SOLN
INTRAVENOUS | Status: AC
  Filled 2024-01-29: qty 10

## 2024-01-29 MED ORDER — INSULIN ASPART 100 UNIT/ML IJ SOLN
0.0000 [IU] | INTRAMUSCULAR | Status: AC | PRN
  Administered 2024-01-29: 6 [IU] via SUBCUTANEOUS
  Administered 2024-01-29: 4 [IU] via SUBCUTANEOUS
  Filled 2024-01-29: qty 1

## 2024-01-29 MED ORDER — LEVETIRACETAM (KEPPRA) 500 MG/5 ML ADULT IV PUSH
500.0000 mg | Freq: Two times a day (BID) | INTRAVENOUS | Status: DC
  Administered 2024-01-29: 500 mg via INTRAVENOUS
  Filled 2024-01-29: qty 5

## 2024-01-29 MED ORDER — 0.9 % SODIUM CHLORIDE (POUR BTL) OPTIME
TOPICAL | Status: DC | PRN
  Administered 2024-01-29: 2000 mL

## 2024-01-29 MED ORDER — SURGIFLO WITH THROMBIN (HEMOSTATIC MATRIX KIT) OPTIME
TOPICAL | Status: DC | PRN
  Administered 2024-01-29: 1 via TOPICAL

## 2024-01-29 MED ORDER — PROPOFOL 10 MG/ML IV BOLUS
INTRAVENOUS | Status: DC | PRN
  Administered 2024-01-29: 125 ug/kg/min via INTRAVENOUS
  Administered 2024-01-29: 160 mg via INTRAVENOUS

## 2024-01-29 MED ORDER — ORAL CARE MOUTH RINSE: 15.0000 mL | Freq: Once | OROMUCOSAL | Status: AC

## 2024-01-29 MED ORDER — HEMOSTATIC AGENTS (NO CHARGE) OPTIME
TOPICAL | Status: DC | PRN
  Administered 2024-01-29: 1 via TOPICAL

## 2024-01-29 MED ORDER — ONDANSETRON HCL 4 MG/2ML IJ SOLN
INTRAMUSCULAR | Status: AC
  Filled 2024-01-29: qty 2

## 2024-01-29 MED ORDER — PANTOPRAZOLE SODIUM 40 MG IV SOLR
40.0000 mg | Freq: Every day | INTRAVENOUS | Status: DC
  Administered 2024-01-29: 40 mg via INTRAVENOUS
  Filled 2024-01-29: qty 10

## 2024-01-29 MED ORDER — BENAZEPRIL HCL 20 MG PO TABS
40.0000 mg | ORAL_TABLET | Freq: Every day | ORAL | Status: DC
  Administered 2024-01-29 – 2024-01-31 (×3): 40 mg via ORAL
  Filled 2024-01-29 (×3): qty 2

## 2024-01-29 MED ORDER — BUTALBITAL-APAP-CAFFEINE 50-325-40 MG PO TABS
2.0000 | ORAL_TABLET | ORAL | Status: DC | PRN
  Administered 2024-01-29 – 2024-01-30 (×3): 2 via ORAL
  Filled 2024-01-29 (×3): qty 2

## 2024-01-29 MED ORDER — DEXAMETHASONE 4 MG PO TABS: 4.0000 mg | ORAL_TABLET | Freq: Three times a day (TID) | ORAL | Status: DC

## 2024-01-29 MED ORDER — FENTANYL CITRATE (PF) 100 MCG/2ML IJ SOLN
25.0000 ug | INTRAMUSCULAR | Status: DC | PRN
  Administered 2024-01-29 (×2): 25 ug via INTRAVENOUS

## 2024-01-29 MED ORDER — INSULIN ASPART 100 UNIT/ML IJ SOLN
0.0000 [IU] | INTRAMUSCULAR | Status: DC
  Administered 2024-01-29: 3 [IU] via SUBCUTANEOUS
  Administered 2024-01-29: 5 [IU] via SUBCUTANEOUS
  Administered 2024-01-30: 3 [IU] via SUBCUTANEOUS
  Administered 2024-01-30 (×2): 2 [IU] via SUBCUTANEOUS

## 2024-01-29 MED ORDER — ACETAMINOPHEN 10 MG/ML IV SOLN: 1000.0000 mg | Freq: Once | INTRAVENOUS | Status: DC | PRN

## 2024-01-29 MED ORDER — PROPOFOL 10 MG/ML IV BOLUS
INTRAVENOUS | Status: AC
  Filled 2024-01-29: qty 20

## 2024-01-29 MED ORDER — INSULIN GLARGINE-YFGN 100 UNIT/ML ~~LOC~~ SOLN
20.0000 [IU] | Freq: Two times a day (BID) | SUBCUTANEOUS | Status: DC
  Administered 2024-01-29 – 2024-01-30 (×2): 20 [IU] via SUBCUTANEOUS
  Filled 2024-01-29 (×4): qty 0.2

## 2024-01-29 MED ORDER — CLEVIDIPINE BUTYRATE 0.5 MG/ML IV EMUL
INTRAVENOUS | Status: AC
  Filled 2024-01-29: qty 100

## 2024-01-29 MED ORDER — FUROSEMIDE 10 MG/ML IJ SOLN
INTRAMUSCULAR | Status: AC
  Filled 2024-01-29: qty 4

## 2024-01-29 MED ORDER — GADOBUTROL 1 MMOL/ML IV SOLN
10.0000 mL | Freq: Once | INTRAVENOUS | Status: AC | PRN
  Administered 2024-01-29: 10 mL via INTRAVENOUS

## 2024-01-29 MED ORDER — BACITRACIN ZINC 500 UNIT/GM EX OINT
TOPICAL_OINTMENT | CUTANEOUS | Status: DC | PRN
Start: 2024-01-29 — End: 2024-01-29
  Administered 2024-01-29: 1 via TOPICAL

## 2024-01-29 MED ORDER — LACTATED RINGERS IV SOLN: INTRAVENOUS | Status: DC

## 2024-01-29 MED ORDER — LIDOCAINE-EPINEPHRINE 1 %-1:100000 IJ SOLN
INTRAMUSCULAR | Status: DC | PRN
  Administered 2024-01-29: 10 mL

## 2024-01-29 MED ORDER — PHENYLEPHRINE 80 MCG/ML (10ML) SYRINGE FOR IV PUSH (FOR BLOOD PRESSURE SUPPORT): PREFILLED_SYRINGE | INTRAVENOUS | Status: DC | PRN

## 2024-01-29 MED ORDER — CLEVIDIPINE BUTYRATE 0.5 MG/ML IV EMUL
0.0000 mg/h | INTRAVENOUS | Status: DC
  Administered 2024-01-29: 2 mg/h via INTRAVENOUS

## 2024-01-29 MED ORDER — PROMETHAZINE HCL 25 MG PO TABS: 12.5000 mg | ORAL_TABLET | ORAL | Status: DC | PRN

## 2024-01-29 MED ORDER — LABETALOL HCL 5 MG/ML IV SOLN
10.0000 mg | INTRAVENOUS | Status: DC | PRN
  Administered 2024-01-29 – 2024-01-30 (×2): 10 mg via INTRAVENOUS
  Filled 2024-01-29: qty 4

## 2024-01-29 MED ORDER — CLEVIDIPINE BUTYRATE 0.5 MG/ML IV EMUL
0.0000 mg/h | INTRAVENOUS | Status: DC
  Administered 2024-01-29: 9 mg/h via INTRAVENOUS
  Administered 2024-01-30: 2 mg/h via INTRAVENOUS
  Filled 2024-01-29 (×4): qty 50

## 2024-01-29 MED ORDER — INSULIN ASPART 100 UNIT/ML IJ SOLN
0.0000 [IU] | Freq: Three times a day (TID) | INTRAMUSCULAR | Status: DC
  Administered 2024-01-29: 5 [IU] via SUBCUTANEOUS

## 2024-01-29 MED ORDER — ALBUMIN HUMAN 5 % IV SOLN: INTRAVENOUS | Status: DC | PRN

## 2024-01-29 MED ORDER — ACETAMINOPHEN 650 MG RE SUPP: 650.0000 mg | RECTAL | Status: DC | PRN

## 2024-01-29 MED ORDER — BACLOFEN 10 MG PO TABS
10.0000 mg | ORAL_TABLET | Freq: Two times a day (BID) | ORAL | Status: DC
  Administered 2024-01-29 – 2024-01-31 (×4): 10 mg via ORAL
  Filled 2024-01-29 (×4): qty 1

## 2024-01-29 MED ORDER — PHENYLEPHRINE HCL-NACL 20-0.9 MG/250ML-% IV SOLN
INTRAVENOUS | Status: DC | PRN
  Administered 2024-01-29: 20 ug/min via INTRAVENOUS

## 2024-01-29 MED ORDER — REMIFENTANIL HCL 2 MG IV SOLR
INTRAVENOUS | Status: AC
  Filled 2024-01-29: qty 2000

## 2024-01-29 MED ORDER — FENTANYL CITRATE (PF) 250 MCG/5ML IJ SOLN
INTRAMUSCULAR | Status: DC | PRN
  Administered 2024-01-29: 100 ug via INTRAVENOUS

## 2024-01-29 MED ORDER — PROPOFOL 10 MG/ML IV BOLUS
INTRAVENOUS | Status: AC
Start: 2024-01-29 — End: 2024-01-29
  Filled 2024-01-29: qty 20

## 2024-01-29 MED ORDER — CEFAZOLIN SODIUM-DEXTROSE 2-4 GM/100ML-% IV SOLN
2.0000 g | Freq: Three times a day (TID) | INTRAVENOUS | Status: AC
  Administered 2024-01-29 – 2024-01-30 (×3): 2 g via INTRAVENOUS
  Filled 2024-01-29 (×3): qty 100

## 2024-01-29 MED ORDER — METOPROLOL SUCCINATE ER 25 MG PO TB24
ORAL_TABLET | ORAL | Status: AC
  Filled 2024-01-29: qty 2

## 2024-01-29 MED ORDER — OXYCODONE HCL 5 MG PO TABS: 5.0000 mg | ORAL_TABLET | Freq: Once | ORAL | Status: DC | PRN

## 2024-01-29 MED ORDER — LIDOCAINE-EPINEPHRINE 1 %-1:100000 IJ SOLN
INTRAMUSCULAR | Status: AC
  Filled 2024-01-29: qty 1

## 2024-01-29 MED ORDER — ONDANSETRON HCL 4 MG PO TABS: 4.0000 mg | ORAL_TABLET | ORAL | Status: DC | PRN

## 2024-01-29 MED ORDER — OXYCODONE HCL 5 MG/5ML PO SOLN: 5.0000 mg | Freq: Once | ORAL | Status: DC | PRN

## 2024-01-29 MED ORDER — BACITRACIN ZINC 500 UNIT/GM EX OINT
TOPICAL_OINTMENT | CUTANEOUS | Status: AC
  Filled 2024-01-29: qty 28.35

## 2024-01-29 MED ORDER — DEXAMETHASONE 4 MG PO TABS: 4.0000 mg | ORAL_TABLET | Freq: Two times a day (BID) | ORAL | Status: DC

## 2024-01-29 MED ORDER — SUGAMMADEX SODIUM 200 MG/2ML IV SOLN
INTRAVENOUS | Status: DC | PRN
  Administered 2024-01-29: 200 mg via INTRAVENOUS
  Administered 2024-01-29: 100 mg via INTRAVENOUS

## 2024-01-29 MED ORDER — ONDANSETRON HCL 4 MG/2ML IJ SOLN: 4.0000 mg | INTRAMUSCULAR | Status: DC | PRN

## 2024-01-29 MED ORDER — LABETALOL HCL 5 MG/ML IV SOLN
INTRAVENOUS | Status: AC
  Filled 2024-01-29: qty 4

## 2024-01-29 MED ORDER — ONDANSETRON HCL 4 MG/2ML IJ SOLN
INTRAMUSCULAR | Status: DC | PRN
  Administered 2024-01-29: 4 mg via INTRAVENOUS

## 2024-01-29 MED ORDER — POTASSIUM CHLORIDE IN NACL 20-0.9 MEQ/L-% IV SOLN
INTRAVENOUS | Status: DC
  Filled 2024-01-29: qty 1000

## 2024-01-29 MED ORDER — MEPERIDINE HCL 50 MG/ML IJ SOLN
6.2500 mg | INTRAMUSCULAR | Status: DC | PRN
  Filled 2024-01-29: qty 1

## 2024-01-29 MED ORDER — CHLORHEXIDINE GLUCONATE 0.12 % MT SOLN
15.0000 mL | Freq: Once | OROMUCOSAL | Status: AC
  Administered 2024-01-29: 15 mL via OROMUCOSAL
  Filled 2024-01-29: qty 15

## 2024-01-29 MED ORDER — PROPOFOL 1000 MG/100ML IV EMUL
INTRAVENOUS | Status: AC
  Filled 2024-01-29: qty 300

## 2024-01-29 MED ORDER — EZETIMIBE 10 MG PO TABS
10.0000 mg | ORAL_TABLET | Freq: Every day | ORAL | Status: DC
  Administered 2024-01-30 – 2024-01-31 (×2): 10 mg via ORAL
  Filled 2024-01-29 (×2): qty 1

## 2024-01-29 MED ORDER — DEXAMETHASONE 4 MG PO TABS
6.0000 mg | ORAL_TABLET | Freq: Four times a day (QID) | ORAL | Status: AC
  Administered 2024-01-29 – 2024-01-30 (×4): 6 mg via ORAL
  Filled 2024-01-29 (×4): qty 2

## 2024-01-29 MED ORDER — PHENYLEPHRINE HCL-NACL 20-0.9 MG/250ML-% IV SOLN
INTRAVENOUS | Status: AC
  Filled 2024-01-29: qty 250

## 2024-01-29 MED ORDER — AMLODIPINE BESYLATE 5 MG PO TABS
5.0000 mg | ORAL_TABLET | Freq: Every day | ORAL | Status: DC
  Filled 2024-01-29: qty 1

## 2024-01-29 MED ORDER — ROCURONIUM BROMIDE 10 MG/ML (PF) SYRINGE
PREFILLED_SYRINGE | INTRAVENOUS | Status: AC
  Filled 2024-01-29: qty 10

## 2024-01-29 MED ORDER — SODIUM CHLORIDE 0.9 % IV SOLN
INTRAVENOUS | Status: DC | PRN
  Administered 2024-01-29: .2 ug/kg/min via INTRAVENOUS

## 2024-01-29 MED ORDER — LIDOCAINE 2% (20 MG/ML) 5 ML SYRINGE
INTRAMUSCULAR | Status: AC
  Filled 2024-01-29: qty 5

## 2024-01-29 MED ORDER — METOPROLOL SUCCINATE ER 25 MG PO TB24
25.0000 mg | ORAL_TABLET | Freq: Every day | ORAL | Status: DC
  Administered 2024-01-29: 25 mg via ORAL
  Filled 2024-01-29 (×2): qty 1

## 2024-01-29 MED ORDER — LIDOCAINE 2% (20 MG/ML) 5 ML SYRINGE
INTRAMUSCULAR | Status: DC | PRN
  Administered 2024-01-29: 60 mg via INTRAVENOUS

## 2024-01-29 SURGICAL SUPPLY — 65 items
BAG COUNTER SPONGE SURGICOUNT (BAG) ×1 IMPLANT
BUR ACORN 6.0 PRECISION (BURR) ×1 IMPLANT
BUR MATCHSTICK NEURO 3.0 LAGG (BURR) IMPLANT
BUR SPIRAL ROUTER 2.3 (BUR) ×1 IMPLANT
CANISTER SUCTION 3000ML PPV (SUCTIONS) ×1 IMPLANT
CASSETTE SUCT IRRIG SONOPET IQ (MISCELLANEOUS) IMPLANT
CHLORAPREP W/TINT 26 (MISCELLANEOUS) ×2 IMPLANT
CLIP TI MEDIUM 6 (CLIP) IMPLANT
CNTNR URN SCR LID CUP LEK RST (MISCELLANEOUS) ×1 IMPLANT
COMB HAIR BLACK 7 SU STRL (MISCELLANEOUS) ×1 IMPLANT
COVER MAYO STAND STRL (DRAPES) ×3 IMPLANT
DRAIN WOUND RND W/TROCAR (DRAIN) IMPLANT
DRAPE MICROSCOPE LEICA (MISCELLANEOUS) ×1 IMPLANT
DRAPE NEUROLOGICAL W/INCISE (DRAPES) ×1 IMPLANT
DRAPE UTILITY 15X26 TOWEL STRL (DRAPES) ×2 IMPLANT
DRAPE WARM FLUID 44X44 (DRAPES) ×1 IMPLANT
DRSG TEGADERM 4X4.75 (GAUZE/BANDAGES/DRESSINGS) ×1 IMPLANT
DRSG TELFA 3X8 NADH STRL (GAUZE/BANDAGES/DRESSINGS) ×2 IMPLANT
ELECTRODE REM PT RTRN 9FT ADLT (ELECTROSURGICAL) ×1 IMPLANT
EVACUATOR SILICONE 100CC (DRAIN) IMPLANT
FEE COVERAGE SUPPORT O-ARM (MISCELLANEOUS) ×1 IMPLANT
FORCEPS BIPOLAR SPETZLER 8 1.0 (NEUROSURGERY SUPPLIES) ×1 IMPLANT
GAUZE 4X4 16PLY ~~LOC~~+RFID DBL (SPONGE) IMPLANT
GAUZE PAD ABD 8X10 STRL (GAUZE/BANDAGES/DRESSINGS) IMPLANT
GLOVE BIOGEL M SZ8.5 STRL (GLOVE) ×2 IMPLANT
GOWN STRL REUS W/ TWL LRG LVL3 (GOWN DISPOSABLE) IMPLANT
GOWN STRL REUS W/ TWL XL LVL3 (GOWN DISPOSABLE) IMPLANT
GOWN STRL SURGICAL XL XLNG (GOWN DISPOSABLE) ×1 IMPLANT
GRAFT DURAGEN MATRIX 2WX2L IMPLANT
HEMOSTAT SURGICEL 2X14 (HEMOSTASIS) ×1 IMPLANT
KIT BASIN OR (CUSTOM PROCEDURE TRAY) ×1 IMPLANT
KIT TURNOVER KIT B (KITS) ×1 IMPLANT
MARKER SKIN DUAL TIP RULER LAB (MISCELLANEOUS) IMPLANT
MARKER SPHERE PSV REFLC NDI (MISCELLANEOUS) IMPLANT
NDL HYPO 25X1 1.5 SAFETY (NEEDLE) ×1 IMPLANT
NEEDLE HYPO 25X1 1.5 SAFETY (NEEDLE) ×1 IMPLANT
PACK CRANIOTOMY CUSTOM (CUSTOM PROCEDURE TRAY) ×1 IMPLANT
PATTIES SURGICAL .25X.25 (GAUZE/BANDAGES/DRESSINGS) ×1 IMPLANT
PATTIES SURGICAL .5 X3 (DISPOSABLE) IMPLANT
PENCIL BUTTON HOLSTER BLD 10FT (ELECTRODE) ×1 IMPLANT
PLATE BONE 12 2H TARGET XL (Plate) IMPLANT
POINTER NAVIGATION AXIEM (INSTRUMENTS) IMPLANT
POINTER TRACER AXIEM (INSTRUMENTS) IMPLANT
SCREW UNIII AXS SD 1.5X4 (Screw) IMPLANT
SOL PREP POV-IOD 4OZ 10% (MISCELLANEOUS) ×1 IMPLANT
SOLN 0.9% NACL POUR BTL 1000ML (IV SOLUTION) ×1 IMPLANT
SOLN STERILE WATER BTL 1000 ML (IV SOLUTION) ×1 IMPLANT
SPIKE FLUID TRANSFER (MISCELLANEOUS) ×1 IMPLANT
SPONGE NEURO XRAY DETECT 1X3 (DISPOSABLE) IMPLANT
STAPLER SKIN PROX 35W (STAPLE) ×1 IMPLANT
SURGIFLO W/THROMBIN 8M KIT (HEMOSTASIS) IMPLANT
SUT MNCRL AB 3-0 PS2 18 (SUTURE) ×1 IMPLANT
SUT NURALON 4 0 TR CR/8 (SUTURE) IMPLANT
SUT PROLENE 1X30 V-34 (SUTURE) IMPLANT
SUT PROLENE 5 0 RB 2 (SUTURE) IMPLANT
SUT VIC AB 0 CT2 8-18 (SUTURE) ×2 IMPLANT
SYR 30ML SLIP (SYRINGE) ×1 IMPLANT
TIP SONOPET IQ 12 BARRACUDA (TIP) IMPLANT
TOWEL GREEN STERILE (TOWEL DISPOSABLE) ×1 IMPLANT
TOWEL GREEN STERILE FF (TOWEL DISPOSABLE) ×1 IMPLANT
TRACKER ENT PATIENT (MISCELLANEOUS) IMPLANT
TRAY FOLEY MTR SLVR 14FR STAT (SET/KITS/TRAYS/PACK) IMPLANT
TRAY FOLEY MTR SLVR 16FR STAT (SET/KITS/TRAYS/PACK) IMPLANT
TUBE CONNECTING 20X1/4 (TUBING) IMPLANT
TUBING FEATHERFLOW (TUBING) IMPLANT

## 2024-01-29 NOTE — Transfer of Care (Signed)
 Immediate Anesthesia Transfer of Care Note  Patient: Douglas Hunter  Procedure(s) Performed: CRANIOTOMY TUMOR EXCISION (Left) COMPUTER-ASSISTED NAVIGATION, FOR CRANIAL PROCEDURE (Left)  Patient Location: PACU  Anesthesia Type:General  Level of Consciousness: awake and alert   Airway & Oxygen Therapy: Patient Spontanous Breathing and Patient connected to face mask oxygen  Post-op Assessment: Report given to RN, Post -op Vital signs reviewed and stable, and Patient moving all extremities  Post vital signs: Reviewed and stable  Last Vitals:  Vitals Value Taken Time  BP 187/71 01/29/24 10:15  Temp    Pulse 66 01/29/24 10:20  Resp 21 01/29/24 10:20  SpO2 94 % 01/29/24 10:20  Vitals shown include unfiled device data.  Last Pain:  Vitals:   01/29/24 0657  TempSrc:   PainSc: 1          Complications: There were no known notable events for this encounter.

## 2024-01-29 NOTE — Op Note (Signed)
 DATE OF SURGERY: 01/29/2024   ATTENDING SURGEON: Dino Sable, MD   ASSISTANT: None   PREOPERATIVE DIAGNOSIS: Brain tumor   POSTOPERATIVE DIAGNOSIS: Same    PROCEDURE PERFORMED:  1. LEFT craniotomy for resection of brain tumor 2. Use of neuro navigation for assessment of extent of resection and localization 3. Duraplasty greater than 5 cm 4. Use of microscope for microscopic dissection 5. Cranioplasty >5cm  ANESTHESIA: General endotracheal anesthesia.     ESTIMATED BLOOD LOSS, URINE OUTPUT, AND CRYSTALLOIDS:   See anesthesia chart.     COMPLICATIONS: None.     SPECIMENS: Tumor   DRAINS: JP    PREOPERATIVE COURSE:   Patient is an 68 year old gentleman who presented to our service and was found to have a LEFT temporal tumor.  Options were discussed with the patient and family of doing nothing versus radiation versus surgery followed by radiation and they opted for the latter.  Risks discussed included but not limited to infection, hemorrhage stroke paralysis blindness speech impairment, seizures DVT PE cardiopulmonary complications and death amongst others.  All the questions were answered to the satisfaction as was per them and they requested for us  to proceed.   DESCRIPTION OF PROCEDURE: Patient brought to the operating room after general endotracheal anesthesia was ensued patient placed in the supine position with the head on a horseshoe head holder.  Registration was obtained after which the tumor was outlined on the scalp and a curvilinear incision was outlined.  Hereafter the patient was prepped and draped in usual sterile fashion and the outline of the incision was injected with lidocaine  with epinephrine .  Then, using a #10 blade an incision was made and retractors were placed.  With navigation the tumor was outlined on the skull and 2 bur holes were made, craniotomy performed and the bone was saved in a soaked sponge.  Copious irrigation was performed hemostasis  obtained after which the dura was opened in a cruciate fashion.  The microscope was then brought in, correct entry point identified with navigation and small corticectomy was performed and the tumor was then identified.  This was then biopsied and sent for frozen section and internal debulking was performed.  After circumferential dissection was performed and the shell of the tumor could be dissected away from the brain and in this fashion using a bipolar forceps and suction the tumor was freed from the surrounding brain and in a piecemeal fashion removed.  Here after hemostasis was obtained copious irrigation was performed after which the cavity was lined with Surgicel.  With navigation complete resection was confirmed.  Here after copious irrigation was performed and duraplasty performed after which the microscope was taken out and the bone was replaced with titanium plates and screws and cranioplasty performed.  After hemostasis and irrigation a subgaleal drain was left in situ and the galea and skin were closed in separate layers.   At the end of the procedure all counts were complete.     It should be noted that Stereotactic computer-assisted navigation was indicated due to patient's anatomy. Films were reviewed for preop planning. Registration was personally performed by the me. Visual confirmation was done. Navigation was utilized during different portions of the surgery, including on entry to the brain and localization of the tumor.

## 2024-01-29 NOTE — Consult Note (Signed)
 NAME:  Douglas Hunter, MRN:  [id number], DOB:  [birth year], LOS: 0 ADMISSION DATE:  01/29/2024, CONSULTATION DATE:  10/27 REFERRING MD:  Dr. Rosslyn, CHIEF COMPLAINT:  Tumor resection   History of Present Illness:  Patient is a 68 yo M w/ pertinent PMH OSA on cpap, afib on tikosyn  and xarelto , dmt2, CKD 3, cad, htn, hld presents to [facility] on 10/27 for elective tumor resection.  Patient has been having confusion/memory issues over the past few months that has worsened recently. Came to [facility] ED on 10/22 CT head showing large vasogenic edema throughout left temporal lobe w/ mild mass effect and 3 mm right to left midline shift; 2.4 cm cystic mass in left temporal lobe. MRI showing left temporal lobe mass measuring 4.3 x 3.5 x 2.7 cm with surrounding vasogenic edema, concerning for metastatic disease vs. Primary cns neoplasm. Nsg consulted. Started on keppra and steroids.   On 10/27 patient arrived to [facility] for elective left crani and tumor resection of left brain tumor. Post op started on cleviprex for bp control. Pccm consulted.  Pertinent  Medical History   Past Medical History:  Diagnosis Date   AKI (acute kidney injury) 03/13/2020   Atrial flutter (HCC)    Coronary artery disease 08/2011   a. NSTEMI 08/2011, LHC w/ severe disease of LCx with good collaterals, lesion was high-risk for intervention due to a steep angulation of the LCx coming from the left main coronary artery as well as heavy calcifications, discused at cath conference w/ consensus advising med Rx; b. 03/2020 Cath: LM nl, LAD 45m, LCX 100p w/ R->L and L->L collats. RCA dominant, nl. EF 55%. PA 33/6/19, PCWP 8.   GERD (gastroesophageal reflux disease)    H/O brain tumor 01/2024   History of echocardiogram    a. 01/2020 Echo: EF 60-65%, no rwma. Sev elev PASP. Mod dil LA.   Hyperlipidemia    Hypertension    Myocardial infarction [facility])    Paroxysmal atrial fibrillation (HCC)    a. s/p ablation 01/2013; b CHADS2VASc => 3 (HTN, DM,  vascular disease); c. on Coumadin ; d. 12/2019 Amio d/c'd 2/2 hyperthyroidism.   Poorly controlled diabetes mellitus (HCC)    RBBB    Sleep apnea    uses nightly     Significant Hospital Events: Including procedures, antibiotic start and stop dates in addition to other pertinent events   10/27 elective tumor resection  Interim History / Subjective:  See above  Objective    Blood pressure (!) 142/58, pulse 62, temperature 98.1 F (36.7 C), resp. rate 19, height 5' 10 (1.778 m), weight 115.7 kg, SpO2 93%.        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 Plan: -nsg following; appreciate recs -MRI scheduled for later tonight -cont keppra; seizure precautions -cont steroids -frequent neuro checks -limit sedating meds -clevi  for SBP goal per nsg -Continue neuroprotective measures- normothermia, euglycemia, HOB greater than 30, head in neutral alignment, normocapnia, normoxia -pain management  DMT2 Plan: -basal insulin  -ssi and cbg monitoring  Hx of OSA on cpap Plan: -given crani will hold on cpap for now -tele monitoring; consider [facility] if needed  Afib on tikosyn  and xarelto  Cad Htn Hld Plan: -hold xarelto  for now; when to resume per nsg -HR currently controlled 60s post op; tele monitoring; hold tikosyn  for now -trend electrolytes and replete as needed -cont statin -consider resuming home oral anti-htn  meds  Ckd 3a Plan: -Trend BMP / urinary output -Replace electrolytes as indicated -Avoid nephrotoxic agents, ensure adequate renal perfusion    Labs   CBC: Recent Labs  Lab 01/24/24 1426 01/29/24 0826  WBC 11.1*  --   HGB 14.7 14.3  HCT 45.3 42.0  MCV 93.8  --   PLT 296  --     Basic Metabolic Panel: Recent Labs  Lab 01/24/24 1426 01/29/24 0826  NA 136 137  K 4.6 4.5  CL 103  --   CO2 23  --   GLUCOSE 360*  --   BUN 31*  --   CREATININE 1.43*  --   CALCIUM  8.9  --    GFR: Estimated Creatinine Clearance: 62.1 mL/min (A) (by C-G formula based on SCr of 1.43 mg/dL (H)). Recent Labs  Lab 01/24/24 1426  WBC 11.1*    Liver Function Tests: Recent Labs  Lab 01/24/24 1426  AST 28  ALT 19  ALKPHOS 97  BILITOT 1.2  PROT 8.6*  ALBUMIN  4.0   No results for input(s): LIPASE, AMYLASE in the last 168 hours. No results for input(s): AMMONIA in the last 168 hours.  ABG    Component Value Date/Time   PHART 7.300 (L) 01/29/2024 0826   PCO2ART 37.0 01/29/2024 0826   PO2ART 142 (H) 01/29/2024 0826   HCO3 18.2 (L) 01/29/2024 0826   TCO2 19 (L) 01/29/2024 0826   ACIDBASEDEF 7.0 (H) 01/29/2024 0826   O2SAT 99 01/29/2024 0826     Coagulation Profile: No results for input(s): INR, PROTIME in the last 168 hours.  Cardiac Enzymes: No results for input(s): CKTOTAL, CKMB, CKMBINDEX, TROPONINI in the last 168 hours.  HbA1C: Hemoglobin A1C  Date/Time Value Ref Range Status  08/12/2012 02:50 AM 8.7 (H) 4.2 - 6.3 % Final    Comment:    The American Diabetes Association recommends that a primary goal of therapy should be <7% and that physicians should reevaluate the treatment regimen in patients with HbA1c values consistently >8%.   08/03/2011 03:58 AM 7.4 (H) 4.2 - 6.3 % Final    Comment:    The American Diabetes Association recommends that a primary goal of therapy should be <7% and that physicians should reevaluate the treatment regimen in  patients with HbA1c values consistently >8%.    HB A1C (BAYER DCA - WAIVED)  Date/Time Value Ref Range Status  01/13/2015 08:54 AM 7.8 (H) <7.0 % Final    Comment:                                          Diabetic Adult            <7.0  Healthy Adult        4.3 - 5.7                                                           (DCCT/NGSP) American Diabetes Association's Summary of Glycemic Recommendations for Adults with Diabetes: Hemoglobin A1c <7.0%. More stringent glycemic goals (A1c <6.0%) may further reduce complications at the cost of increased risk of hypoglycemia.    Hgb A1c MFr Bld  Date/Time Value Ref Range Status  04/13/2020 03:19 PM 8.9 (H) 4.8 - 5.6 % Final    Comment:    (NOTE) Pre diabetes:          5.7%-6.4%  Diabetes:              >6.4%  Glycemic control for   <7.0% adults with diabetes     CBG: Recent Labs  Lab 01/24/24 1430 01/24/24 1801 01/29/24 0556 01/29/24 0912 01/29/24 1018  GLUCAP 312* 170* 254* 212* 225*     Past Medical History:  He,  has a past medical history of AKI (acute kidney injury) (03/13/2020), Atrial flutter (HCC), Coronary artery disease (08/2011), GERD (gastroesophageal reflux disease), H/O brain tumor (01/2024), History of echocardiogram, Hyperlipidemia, Hypertension, Myocardial infarction [facility]), Paroxysmal atrial fibrillation (HCC), Poorly controlled diabetes mellitus (HCC), RBBB, and Sleep apnea.   Surgical History:   Past Surgical History:  Procedure Laterality Date   ABLATION  01/29/2013   PVI and CTI by Dr Kelsie for atrial flutter and paroxysmal atrial fibrillation   ANTERIOR CERVICAL DECOMP/DISCECTOMY FUSION N/A 04/18/2018   Procedure: ANTERIOR CERVICAL DECOMPRESSION/DISCECTOMY FUSION 2 LEVELS C5-7;  Surgeon: Clois Fret, MD;  Location: [facility] ORS;  Service: Neurosurgery;  Laterality: N/A;   ATRIAL FIBRILLATION ABLATION N/A 01/29/2013   Procedure: ATRIAL FIBRILLATION ABLATION;   Surgeon: Lynwood JONETTA Kelsie, MD;  Location: [facility] CATH LAB;  Service: Cardiovascular;  Laterality: N/A;   ATRIAL FIBRILLATION ABLATION N/A 11/15/2021   Procedure: ATRIAL FIBRILLATION ABLATION;  Surgeon: Cindie Ole DASEN, MD;  Location: [facility] INVASIVE CV LAB;  Service: Cardiovascular;  Laterality: N/A;   CARDIAC CATHETERIZATION  08/03/2011   COLONOSCOPY WITH PROPOFOL  N/A 02/02/2016   Procedure: COLONOSCOPY WITH PROPOFOL ;  Surgeon: Rogelia Copping, MD;  Location: [facility] ENDOSCOPY;  Service: Endoscopy;  Laterality: N/A;   COLONOSCOPY WITH PROPOFOL  N/A 04/20/2023   Procedure: COLONOSCOPY WITH PROPOFOL ;  Surgeon: Copping Rogelia, MD;  Location: [facility] ENDOSCOPY;  Service: Endoscopy;  Laterality: N/A;   LUMBAR LAMINECTOMY/DECOMPRESSION MICRODISCECTOMY N/A 06/07/2017   Procedure: LUMBAR LAMINECTOMY/DECOMPRESSION MICRODISCECTOMY 2 LEVELS-L3-4,L4-5;  Surgeon: Clois Fret, MD;  Location: [facility] ORS;  Service: Neurosurgery;  Laterality: N/A;   POLYPECTOMY  04/20/2023   Procedure: POLYPECTOMY;  Surgeon: Copping Rogelia, MD;  Location: [facility] ENDOSCOPY;  Service: Endoscopy;;   RIGHT/LEFT HEART CATH AND CORONARY ANGIOGRAPHY N/A 03/06/2020   Procedure: RIGHT/LEFT HEART CATH AND CORONARY ANGIOGRAPHY;  Surgeon: Perla Evalene PARAS, MD;  Location: [facility] INVASIVE CV LAB;  Service: Cardiovascular;  Laterality: N/A;   TEE WITHOUT CARDIOVERSION N/A 01/28/2013   Procedure: TRANSESOPHAGEAL ECHOCARDIOGRAM (TEE);  Surgeon: Redell GORMAN Shallow, MD;  [facility] ENDOSCOPY;  Service: Cardiovascular;  Laterality: N/A;     Social History:   reports that he quit smoking about 16 years ago. His smoking use included cigarettes. He started smoking about 51 years ago. He has a 52.5 pack-year smoking history. He  has never used smokeless tobacco. He reports that he does not currently use alcohol. He reports that he does not use drugs.   Family History:  His family history includes Cancer in his father; Diabetes in his son; Heart attack in his maternal uncle;  Heart attack (age of onset: 47) in his father; Heart disease in his mother and paternal grandfather; Stroke in his mother.   Allergies No Known Allergies   Home Medications  Prior to Admission medications   Medication Sig Start Date End Date Taking? Authorizing Provider  acetaminophen  (TYLENOL ) 500 MG tablet Take 500 mg by mouth 4 (four) times daily as needed for mild pain or moderate pain.   Yes [provider]  amLODipine  (NORVASC ) 5 MG tablet TAKE 1 TABLET BY MOUTH ONCE DAILY 12/21/23  Yes Cindie Ole DASEN, MD  atorvastatin  (LIPITOR ) 80 MG tablet Take 80 mg by mouth at bedtime.   Yes [provider]  benazepril  (LOTENSIN ) 40 MG tablet TAKE 1 TABLET BY MOUTH ONCE DAILY 02/03/23  Yes Gollan, Timothy J, MD  cholecalciferol  (VITAMIN D3) 25 MCG (1000 UNIT) tablet Take 500 Units by mouth daily.   Yes [provider]  dexamethasone  (DECADRON ) 4 MG tablet Take 1 tablet (4 mg total) by mouth 2 (two) times daily. 01/25/24  Yes Janjua, Rashid M, MD  dofetilide  (TIKOSYN ) 500 MCG capsule TAKE 1 CAPSULE BY MOUTH TWICE DAILY 09/22/23  Yes Cindie Ole DASEN, MD  ezetimibe  (ZETIA ) 10 MG tablet TAKE 1 TABLET BY MOUTH ONCE DAILY 03/06/23  Yes Gollan, Timothy J, MD  furosemide  (LASIX ) 20 MG tablet TAKE 1 TABLET BY MOUTH TWICE DAILY AS NEEDED 12/29/22  Yes Gollan, Timothy J, MD  insulin  glargine (LANTUS ) 100 UNIT/ML injection Inject 50 Units into the skin 2 (two) times daily.   Yes [provider]  insulin  lispro (HUMALOG ) 100 UNIT/ML KiwkPen Inject 16-22 Units into the skin 2 (two) times daily. Sliding Scale 12/13/17 05/27/28 Yes [provider]  JARDIANCE 25 MG TABS tablet Take 12.5 mg by mouth daily.   Yes [provider]  levETIRAcetam (KEPPRA) 500 MG tablet Take 1 tablet (500 mg total) by mouth 2 (two) times daily. 01/24/24 02/23/24 Yes Ray, Nilsa, MD  metoprolol  succinate (TOPROL -XL) 25 MG 24 hr tablet TAKE 1 TABLET BY MOUTH ONCE DAILY 02/10/23  Yes Gollan,  Timothy J, MD  OVER THE COUNTER MEDICATION Take 1 capsule by mouth 2 (two) times daily. Omega XL   Yes [provider]  rivaroxaban  (XARELTO ) 20 MG TABS tablet Take 1 tablet (20 mg total) by mouth daily with supper. 03/18/20  Yes Cindie Ole DASEN, MD  Continuous Blood Gluc Sensor (FREESTYLE LIBRE 14 DAY SENSOR) MISC SMARTSIG:1 Kit(s) Topical Every 2 Weeks 03/29/20   [provider]  INSULIN  SYRINGE 1CC/29G 29G X 1/2 1 ML MISC USE AS DIRECTED 05/27/15   Daphane Rosella, NP  nitroGLYCERIN  (NITROSTAT ) 0.4 MG SL tablet Place 1 tablet (0.4 mg total) under the tongue every 5 (five) minutes as needed for chest pain. 07/31/23   Gollan, Timothy J, MD  ONE TOUCH ULTRA TEST test strip TEST THREE TIMES A DAY AS DIRECTED. 02/04/15   Daphane Rosella, NP     Critical care time: 45 minutes     JD Emilio DEVONNA [facility] 01/29/2024, 11:28 AM  Please see [email or web address] for pager details.  From 7A-7P if no response, please call [phone]. After hours, please call ELink [phone].

## 2024-01-29 NOTE — H&P (Signed)
 68yo gentleman with a LEFT brain tumor  Past Medical History:  Diagnosis Date   AKI (acute kidney injury) 03/13/2020   Atrial flutter (HCC)    Coronary artery disease 08/2011   a. NSTEMI 08/2011, LHC w/ severe disease of LCx with good collaterals, lesion was high-risk for intervention due to a steep angulation of the LCx coming from the left main coronary artery as well as heavy calcifications, discused at cath conference w/ consensus advising med Rx; b. 03/2020 Cath: LM nl, LAD 23m, LCX 100p w/ R->L and L->L collats. RCA dominant, nl. EF 55%. PA 33/6/19, PCWP 8.   GERD (gastroesophageal reflux disease)    H/O brain tumor 01/2024   History of echocardiogram    a. 01/2020 Echo: EF 60-65%, no rwma. Sev elev PASP. Mod dil LA.   Hyperlipidemia    Hypertension    Myocardial infarction [facility])    Paroxysmal atrial fibrillation (HCC)    a. s/p ablation 01/2013; b CHADS2VASc => 3 (HTN, DM, vascular disease); c. on Coumadin ; d. 12/2019 Amio d/c'd 2/2 hyperthyroidism.   Poorly controlled diabetes mellitus (HCC)    RBBB    Sleep apnea    uses nightly   Psh Social History   Socioeconomic History   Marital status: Married    Spouse name: Not on file   Number of children: 2   Years of education: Not on file   Highest education level: Not on file  Occupational History   Not on file  Tobacco Use   Smoking status: Former    Current packs/day: 0.00    Average packs/day: 1.5 packs/day for 35.0 years (52.5 ttl pk-yrs)    Types: Cigarettes    Start date: 01/15/1973    Quit date: 01/16/2008    Years since quitting: 16.0   Smokeless tobacco: Never   Tobacco comments:    Former smoker 12/09/21  Vaping Use   Vaping status: Never Used  Substance and Sexual Activity   Alcohol use: Not Currently    Comment: occasional   Drug use: No   Sexual activity: Not Currently  Other Topics Concern   Not on file  Social History Narrative   Pt lives in [place] (right outside of [place])   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   Overall Financial Resource Strain (CARDIA)    Difficulty of Paying Living Expenses: Not hard at all  Food Insecurity: No Food Insecurity (01/31/2023)   [facility] from [facility]   Hunger Vital Sign    Within the past 12 months, you worried that your food would run out before you got the money to buy more.: Never true    Within the past 12 months, the food you bought just didn't last and you didn't have money to get more.: Never true  Transportation Needs: No Transportation Needs (01/31/2023)   Received from [facility] - Transportation    In the past 12 months, has lack of transportation kept you from medical appointments or from getting medications?: No    Lack of Transportation (Non-Medical): No  Physical Activity: Not on file  Stress: Not on file  Social Connections: Not on file  Intimate Partner Violence: Not on file   Family History  Problem Relation Age of Onset   Stroke Mother    Heart disease  Mother        pacemaker   Heart attack Father 53   Cancer Father        throat   Heart attack Maternal Uncle    Heart disease Paternal Grandfather        MI   Diabetes Son    No Known Allergies Scheduled Meds:  chlorhexidine   15 mL Mouth/Throat Once   Or   mouth rinse  15 mL Mouth Rinse Once   metoprolol  succinate       Continuous Infusions:  ceFAZolin      PRN Meds:.metoprolol  succinate Vitals:   01/29/24 0550  BP: (!) 155/70  Pulse: 60  Resp: 20  Temp: 97.8 F (36.6 C)  SpO2: 95%   Physical Exam HENT:     Head: Normocephalic.     Nose: Nose normal.  Eyes:     Pupils: Pupils are equal, round, and reactive to light.  Cardiovascular:     Rate and Rhythm: Normal rate.  Pulmonary:     Effort: Pulmonary effort is normal.  Abdominal:      General: Abdomen is flat.  Musculoskeletal:     Cervical back: Normal range of motion.  Neurological:     Mental Status: He is alert.   ASSESSMENT: LEFT BRAIN TUMOR  PLAN: LEFT CRANIOTOMY FOR TUMOR

## 2024-01-29 NOTE — Inpatient Diabetes Management (Addendum)
 Inpatient Diabetes Program Recommendations  AACE/ADA: New Consensus Statement on Inpatient Glycemic Control (2015)  Target Ranges:  Prepandial:   less than 140 mg/dL      Peak postprandial:   less than 180 mg/dL (1-2 hours)      Critically ill patients:  140 - 180 mg/dL   Lab Results  Component Value Date   GLUCAP 225 (H) 01/29/2024   HGBA1C 8.9 (H) 04/13/2020    Review of Glycemic Control  Latest Reference Range & Units 01/29/24 05:56 01/29/24 09:12 01/29/24 10:18  Glucose-Capillary 70 - 99 mg/dL 745 (H) 787 (H) 774 (H)   Diabetes history: DM 2 Outpatient Diabetes medications:  Decadron  4 mg PO bid Lantus  50 units bid Humalog  16-22 units bid Jardiance 12.5 mg daily Current orders for Inpatient glycemic control:  Novolog  0-15 tid with meals   Inpatient Diabetes Program Recommendations:    Please consider adding Semglee  25 units bid.  Also consider increasing Novolog  correction to q 4 hours.    Thanks,  [name], RN, BC-ADM Inpatient Diabetes Coordinator Pager [phone]  (8a-5p)

## 2024-01-29 NOTE — Anesthesia Postprocedure Evaluation (Signed)
 Anesthesia Post Note  Patient: Douglas Hunter  Procedure(s) Performed: CRANIOTOMY TUMOR EXCISION (Left) COMPUTER-ASSISTED NAVIGATION, FOR CRANIAL PROCEDURE (Left)     Patient location during evaluation: PACU Anesthesia Type: General Level of consciousness: awake and alert Pain management: pain level controlled Vital Signs Assessment: post-procedure vital signs reviewed and stable Respiratory status: spontaneous breathing, nonlabored ventilation, respiratory function stable and patient connected to nasal cannula oxygen Cardiovascular status: blood pressure returned to baseline and stable Postop Assessment: no apparent nausea or vomiting Anesthetic complications: no   There were no known notable events for this encounter.  Last Vitals:  Vitals:   01/29/24 1745 01/29/24 1800  BP: (!) 160/77 (!) 141/64  Pulse: 78 71  Resp: 19 (!) 23  Temp:    SpO2: 95% 95%    Last Pain:  Vitals:   01/29/24 1630  TempSrc:   PainSc: 0-No pain                 [name]

## 2024-01-29 NOTE — Anesthesia Procedure Notes (Signed)
 Procedure Name: Intubation Date/Time: 01/29/2024 7:52 AM  Performed by: Emmitt Millman, CRNAPre-anesthesia Checklist: Patient identified, Emergency Drugs available, Suction available and Patient being monitored Patient Re-evaluated:Patient Re-evaluated prior to induction Oxygen Delivery Method: Circle system utilized Preoxygenation: Pre-oxygenation with 100% oxygen Induction Type: IV induction and Cricoid Pressure applied Ventilation: Mask ventilation without difficulty and Oral airway inserted - appropriate to patient size Laryngoscope Size: Mac and 4 Grade View: Grade I Tube type: Oral Tube size: 7.5 mm Number of attempts: 1 Airway Equipment and Method: Stylet and Oral airway Placement Confirmation: ETT inserted through vocal cords under direct vision, positive ETCO2 and breath sounds checked- equal and bilateral Secured at: 23 cm Tube secured with: Tape Dental Injury: Teeth and Oropharynx as per pre-operative assessment

## 2024-01-29 NOTE — Anesthesia Procedure Notes (Signed)
 Arterial Line Insertion Start/End10/27/2025 7:20 AM, 01/29/2024 7:28 AM Performed by: Lansing Hildegard NOVAK, CRNA, CRNA  Patient location: Pre-op. Preanesthetic checklist: patient identified, IV checked, site marked, risks and benefits discussed, surgical consent, monitors and equipment checked, pre-op evaluation, timeout performed and anesthesia consent Lidocaine  1% used for infiltration Left, radial was placed Catheter size: 20 G Hand hygiene performed , maximum sterile barriers used  and Seldinger technique used Allen's test indicative of satisfactory collateral circulation Attempts: 3 Procedure performed using ultrasound to evaluate access site (image not saved). Ultrasound Notes:relevant anatomy identified, ultrasound used to visualize needle entry and vessel patent under ultrasound. Following insertion, dressing applied. Post procedure assessment: normal and unchanged  Post procedure complications: unsuccessful attempts and second provider assisted. Patient tolerated the procedure well with no immediate complications.

## 2024-01-30 ENCOUNTER — Encounter (HOSPITAL_COMMUNITY): Payer: Self-pay | Admitting: Neurosurgery

## 2024-01-30 LAB — GLUCOSE, CAPILLARY
Glucose-Capillary: 138 mg/dL — ABNORMAL HIGH (ref 70–99)
Glucose-Capillary: 141 mg/dL — ABNORMAL HIGH (ref 70–99)
Glucose-Capillary: 152 mg/dL — ABNORMAL HIGH (ref 70–99)
Glucose-Capillary: 191 mg/dL — ABNORMAL HIGH (ref 70–99)
Glucose-Capillary: 208 mg/dL — ABNORMAL HIGH (ref 70–99)
Glucose-Capillary: 242 mg/dL — ABNORMAL HIGH (ref 70–99)
Glucose-Capillary: 277 mg/dL — ABNORMAL HIGH (ref 70–99)

## 2024-01-30 LAB — CBC
HCT: 41 % (ref 39.0–52.0)
Hemoglobin: 13.9 g/dL (ref 13.0–17.0)
MCH: 31 pg (ref 26.0–34.0)
MCHC: 33.9 g/dL (ref 30.0–36.0)
MCV: 91.5 fL (ref 80.0–100.0)
Platelets: 272 K/uL (ref 150–400)
RBC: 4.48 MIL/uL (ref 4.22–5.81)
RDW: 13.3 % (ref 11.5–15.5)
WBC: 18.5 K/uL — ABNORMAL HIGH (ref 4.0–10.5)
nRBC: 0 % (ref 0.0–0.2)

## 2024-01-30 LAB — MAGNESIUM: Magnesium: 2.1 mg/dL (ref 1.7–2.4)

## 2024-01-30 LAB — BASIC METABOLIC PANEL WITH GFR
Anion gap: 8 (ref 5–15)
Anion gap: 10 (ref 5–15)
BUN: 37 mg/dL — ABNORMAL HIGH (ref 8–23)
BUN: 38 mg/dL — ABNORMAL HIGH (ref 8–23)
CO2: 18 mmol/L — ABNORMAL LOW (ref 22–32)
CO2: 16 mmol/L — ABNORMAL LOW (ref 22–32)
Calcium: 8.4 mg/dL — ABNORMAL LOW (ref 8.9–10.3)
Calcium: 8.1 mg/dL — ABNORMAL LOW (ref 8.9–10.3)
Chloride: 108 mmol/L (ref 98–111)
Chloride: 105 mmol/L (ref 98–111)
Creatinine, Ser: 1.38 mg/dL — ABNORMAL HIGH (ref 0.61–1.24)
Creatinine, Ser: 1.46 mg/dL — ABNORMAL HIGH (ref 0.61–1.24)
GFR, Estimated: 55 mL/min — ABNORMAL LOW (ref >60)
GFR, Estimated: 52 mL/min — ABNORMAL LOW (ref >60)
Glucose, Bld: 144 mg/dL — ABNORMAL HIGH (ref 70–99)
Glucose, Bld: 227 mg/dL — ABNORMAL HIGH (ref 70–99)
Potassium: 4.3 mmol/L (ref 3.5–5.1)
Potassium: 4.3 mmol/L (ref 3.5–5.1)
Sodium: 134 mmol/L — ABNORMAL LOW (ref 135–145)
Sodium: 131 mmol/L — ABNORMAL LOW (ref 135–145)

## 2024-01-30 LAB — HEMOGLOBIN A1C
Hgb A1c MFr Bld: 8 % — ABNORMAL HIGH (ref 4.8–5.6)
Mean Plasma Glucose: 182.9 mg/dL

## 2024-01-30 MED ORDER — INSULIN GLARGINE-YFGN 100 UNIT/ML ~~LOC~~ SOLN
30.0000 [IU] | Freq: Two times a day (BID) | SUBCUTANEOUS | Status: DC
  Administered 2024-01-30 – 2024-01-31 (×2): 30 [IU] via SUBCUTANEOUS
  Filled 2024-01-30 (×3): qty 0.3

## 2024-01-30 MED ORDER — HYDROCHLOROTHIAZIDE 12.5 MG PO TABS
12.5000 mg | ORAL_TABLET | Freq: Every day | ORAL | Status: DC
  Administered 2024-01-30: 12.5 mg via ORAL
  Filled 2024-01-30: qty 1

## 2024-01-30 MED ORDER — PANTOPRAZOLE SODIUM 40 MG PO TBEC
40.0000 mg | DELAYED_RELEASE_TABLET | Freq: Every day | ORAL | Status: DC
  Administered 2024-01-30 – 2024-01-31 (×2): 40 mg via ORAL
  Filled 2024-01-30 (×2): qty 1

## 2024-01-30 MED ORDER — INSULIN ASPART 100 UNIT/ML IJ SOLN
0.0000 [IU] | Freq: Three times a day (TID) | INTRAMUSCULAR | Status: DC
  Administered 2024-01-31: 9 [IU] via SUBCUTANEOUS
  Administered 2024-01-31: 7 [IU] via SUBCUTANEOUS

## 2024-01-30 MED ORDER — MAGNESIUM SULFATE 2 GM/50ML IV SOLN
2.0000 g | Freq: Once | INTRAVENOUS | Status: AC
  Administered 2024-01-30: 2 g via INTRAVENOUS
  Filled 2024-01-30: qty 50

## 2024-01-30 MED ORDER — DOFETILIDE 500 MCG PO CAPS
500.0000 ug | ORAL_CAPSULE | Freq: Two times a day (BID) | ORAL | Status: DC
  Administered 2024-01-30 – 2024-01-31 (×3): 500 ug via ORAL
  Filled 2024-01-30 (×4): qty 1

## 2024-01-30 MED ORDER — INSULIN ASPART 100 UNIT/ML IJ SOLN
0.0000 [IU] | INTRAMUSCULAR | Status: DC
  Administered 2024-01-30: 4 [IU] via SUBCUTANEOUS
  Administered 2024-01-30: 11 [IU] via SUBCUTANEOUS
  Administered 2024-01-30: 7 [IU] via SUBCUTANEOUS

## 2024-01-30 MED ORDER — INSULIN ASPART 100 UNIT/ML IJ SOLN
0.0000 [IU] | Freq: Every day | INTRAMUSCULAR | Status: DC
  Administered 2024-01-30: 2 [IU] via SUBCUTANEOUS

## 2024-01-30 MED ORDER — HYDRALAZINE HCL 20 MG/ML IJ SOLN
10.0000 mg | INTRAMUSCULAR | Status: DC | PRN
  Administered 2024-01-30 – 2024-01-31 (×3): 20 mg via INTRAVENOUS
  Filled 2024-01-30 (×3): qty 1

## 2024-01-30 MED ORDER — HYDRALAZINE HCL 10 MG PO TABS
10.0000 mg | ORAL_TABLET | Freq: Three times a day (TID) | ORAL | Status: DC
  Administered 2024-01-30 (×2): 10 mg via ORAL
  Filled 2024-01-30 (×3): qty 1

## 2024-01-30 MED ORDER — AMLODIPINE BESYLATE 10 MG PO TABS
10.0000 mg | ORAL_TABLET | Freq: Every day | ORAL | Status: DC
Start: 2024-01-30 — End: 2024-01-31
  Administered 2024-01-30 – 2024-01-31 (×2): 10 mg via ORAL
  Filled 2024-01-30 (×2): qty 1

## 2024-01-30 NOTE — Evaluation (Signed)
 Clinical/Bedside Swallow Evaluation Patient Details  Name: Douglas Hunter MRN: [id number] Date of Birth: June 15, 1955  Today's Date: 01/30/2024 Time: SLP Start Time (ACUTE ONLY): 1017 SLP Stop Time (ACUTE ONLY): 1025 SLP Time Calculation (min) (ACUTE ONLY): 8 min  Past Medical History:  Past Medical History:  Diagnosis Date   AKI (acute kidney injury) 03/13/2020   Atrial flutter (HCC)    Coronary artery disease 08/2011   a. NSTEMI 08/2011, LHC w/ severe disease of LCx with good collaterals, lesion was high-risk for intervention due to a steep angulation of the LCx coming from the left main coronary artery as well as heavy calcifications, discused at cath conference w/ consensus advising med Rx; b. 03/2020 Cath: LM nl, LAD 58m, LCX 100p w/ R->L and L->L collats. RCA dominant, nl. EF 55%. PA 33/6/19, PCWP 8.   GERD (gastroesophageal reflux disease)    H/O brain tumor 01/2024   History of echocardiogram    a. 01/2020 Echo: EF 60-65%, no rwma. Sev elev PASP. Mod dil LA.   Hyperlipidemia    Hypertension    Myocardial infarction [facility])    Paroxysmal atrial fibrillation (HCC)    a. s/p ablation 01/2013; b CHADS2VASc => 3 (HTN, DM, vascular disease); c. on Coumadin ; d. 12/2019 Amio d/c'd 2/2 hyperthyroidism.   Poorly controlled diabetes mellitus (HCC)    RBBB    Sleep apnea    uses nightly   Past Surgical History:  Past Surgical History:  Procedure Laterality Date   ABLATION  01/29/2013   PVI and CTI by Dr Kelsie for atrial flutter and paroxysmal atrial fibrillation   ANTERIOR CERVICAL DECOMP/DISCECTOMY FUSION N/A 04/18/2018   Procedure: ANTERIOR CERVICAL DECOMPRESSION/DISCECTOMY FUSION 2 LEVELS C5-7;  Surgeon: Clois Fret, MD;  Location: [facility] ORS;  Service: Neurosurgery;  Laterality: N/A;   APPLICATION OF CRANIAL NAVIGATION Left 01/29/2024   Procedure: COMPUTER-ASSISTED NAVIGATION, FOR CRANIAL PROCEDURE;  Surgeon: Rosslyn Dino HERO, MD;  Location: [facility] OR;  Service: Neurosurgery;   Laterality: Left;   ATRIAL FIBRILLATION ABLATION N/A 01/29/2013   Procedure: ATRIAL FIBRILLATION ABLATION;  Surgeon: Lynwood JONETTA Kelsie, MD;  Location: [facility] CATH LAB;  Service: Cardiovascular;  Laterality: N/A;   ATRIAL FIBRILLATION ABLATION N/A 11/15/2021   Procedure: ATRIAL FIBRILLATION ABLATION;  Surgeon: Cindie Ole DASEN, MD;  Location: [facility] INVASIVE CV LAB;  Service: Cardiovascular;  Laterality: N/A;   CARDIAC CATHETERIZATION  08/03/2011   COLONOSCOPY WITH PROPOFOL  N/A 02/02/2016   Procedure: COLONOSCOPY WITH PROPOFOL ;  Surgeon: Rogelia Copping, MD;  Location: [facility] ENDOSCOPY;  Service: Endoscopy;  Laterality: N/A;   COLONOSCOPY WITH PROPOFOL  N/A 04/20/2023   Procedure: COLONOSCOPY WITH PROPOFOL ;  Surgeon: Copping Rogelia, MD;  Location: [facility] ENDOSCOPY;  Service: Endoscopy;  Laterality: N/A;   CRANIOTOMY Left 01/29/2024   Procedure: CRANIOTOMY TUMOR EXCISION;  Surgeon: Rosslyn Dino HERO, MD;  Location: [facility] OR;  Service: Neurosurgery;  Laterality: Left;  LEFT TEMPORAL MASS RESCETION   LUMBAR LAMINECTOMY/DECOMPRESSION MICRODISCECTOMY N/A 06/07/2017   Procedure: LUMBAR LAMINECTOMY/DECOMPRESSION MICRODISCECTOMY 2 LEVELS-L3-4,L4-5;  Surgeon: Clois Fret, MD;  Location: [facility] ORS;  Service: Neurosurgery;  Laterality: N/A;   POLYPECTOMY  04/20/2023   Procedure: POLYPECTOMY;  Surgeon: Copping Rogelia, MD;  Location: [facility] ENDOSCOPY;  Service: Endoscopy;;   RIGHT/LEFT HEART CATH AND CORONARY ANGIOGRAPHY N/A 03/06/2020   Procedure: RIGHT/LEFT HEART CATH AND CORONARY ANGIOGRAPHY;  Surgeon: Perla Evalene PARAS, MD;  Location: [facility] INVASIVE CV LAB;  Service: Cardiovascular;  Laterality: N/A;   TEE WITHOUT CARDIOVERSION N/A 01/28/2013   Procedure: TRANSESOPHAGEAL ECHOCARDIOGRAM (TEE);  Surgeon: Redell RAMAN  Pietro, MD;  Location: [facility] ENDOS[facility];  Service: Cardiovascular;  Laterality: N/A;   HPI:  68 yo male presenting 10/27 for elective tumor resection. [facility] 10/22 shows large vasogenic edema throughout the L temporal lobe with mild  mass effect and 3 mm R to L midline shift, 2/4 cm cystic mass in the L temporal lobe. PMH includes AKI, atrial flutter, CAD, GERD, HLD, HTN, MI, DM    Assessment / Plan / Recommendation  Clinical Impression  Pt presents with no focal CN deficits but endorses L sided facial pain when masticating solids. His wife and RN deny concerns related to swallowing. Pt fed himself regular solids, achieving complete oral clearance. He previously passed the swallow screen and sips of thin liquids today were observed without overt s/s of aspiration. Continue current diet, opting for softer solids initially given pain. Ongoing SLP f/u is not needed for swallowing, will sign off. SLP Visit Diagnosis: Dysphagia, unspecified (R13.10)    Aspiration Risk  Mild aspiration risk    Diet Recommendation Regular;Thin liquid    Liquid Administration via: Cup;Straw Medication Administration: Whole meds with liquid Supervision: Patient able to self feed;Intermittent supervision to cue for compensatory strategies Compensations: Slow rate;Small sips/bites Postural Changes: Seated upright at 90 degrees    Other  Recommendations Oral Care Recommendations: Oral care BID     Assistance Recommended at Discharge    Functional Status Assessment Patient has not had a recent decline in their functional status  Frequency and Duration            Prognosis Prognosis for improved oropharyngeal function: Good Barriers to Reach Goals: Cognitive deficits;Language deficits      Swallow Study   General HPI: 68 yo male presenting 10/27 for elective tumor resection. [facility] 10/22 shows large vasogenic edema throughout the L temporal lobe with mild mass effect and 3 mm R to L midline shift, 2/4 cm cystic mass in the L temporal lobe. PMH includes AKI, atrial flutter, CAD, GERD, HLD, HTN, MI, DM Type of Study: Bedside Swallow Evaluation Previous Swallow Assessment: none in chart Diet Prior to this Study: Regular;Thin liquids (Level  0) Temperature Spikes Noted: No Respiratory Status: Room air History of Recent Intubation: No Behavior/Cognition: Alert;Cooperative Oral Cavity Assessment: Within Functional Limits Oral Care Completed by SLP: No Oral Cavity - Dentition: Adequate natural dentition Vision: Functional for self-feeding Self-Feeding Abilities: Able to feed self Patient Positioning: Upright in bed Baseline Vocal Quality: Normal Volitional Cough: Strong Volitional Swallow: Able to elicit    Oral/Motor/Sensory Function Overall Oral Motor/Sensory Function: Within functional limits   Ice Chips Ice chips: Not tested   Thin Liquid Thin Liquid: Within functional limits Presentation: Straw;Self Fed    Nectar Thick Nectar Thick Liquid: Not tested   Honey Thick Honey Thick Liquid: Not tested   Puree Puree: Not tested   Solid     Solid: Within functional limits Presentation: Self Fed      Damien Blumenthal, M.A., CCC-SLP Speech Language Pathology, Acute Rehabilitation Services  Secure Chat preferred [phone]  01/30/2024,10:45 AM

## 2024-01-30 NOTE — Progress Notes (Signed)
 Pharmacy Electrolyte Replacement  Recent Labs:  Recent Labs    01/29/24 2000 01/30/24 0503  K 4.5 4.3  MG 1.8  --   CREATININE 1.43* 1.38*    Low Critical Values (K </= 2.5, Phos </= 1, Mg </= 1) Present: None  Plan: Give magnesium  2g IV once.   Arnisha Laffoon, PharmD

## 2024-01-30 NOTE — Evaluation (Signed)
 Speech Language Pathology Evaluation Patient Details Name: Douglas Hunter MRN: [id number] DOB: [birth year] Today's Date: 01/30/2024 Time: 8974-8964 SLP Time Calculation (min) (ACUTE ONLY): 10 min  Problem List:  Patient Active Problem List   Diagnosis Date Noted   Status post craniotomy 01/29/2024   Brain tumor (HCC) 01/29/2024   Brain mass 01/29/2024   History of colon polyps 04/20/2023   Polyp of ascending colon 04/20/2023   OSA on CPAP 06/28/2022   Secondary hypercoagulable state 04/13/2020   Rapid atrial fibrillation (HCC) 03/13/2020   Postural dizziness with presyncope 03/13/2020   AKI (acute kidney injury) 03/13/2020   Metabolic acidosis 03/13/2020   Elevated troponin 03/13/2020   Atrial fibrillation with rapid ventricular response (HCC) 03/13/2020   Unstable angina (HCC) 03/06/2020   Cervical radiculopathy 04/18/2018   Obesity, unspecified 07/19/2017   Paroxysmal atrial fibrillation (HCC)    Poorly controlled diabetes mellitus (HCC)    Special screening for malignant neoplasms, colon    Polyp of sigmoid colon    Benign neoplasm of ascending colon    Erectile dysfunction 12/10/2015   Gastroesophageal reflux disease without esophagitis 12/03/2015   Snoring 01/23/2013   Atrial flutter (HCC) 08/20/2012   Chest pain 08/19/2011   HTN (hypertension) 08/19/2011   HLD (hyperlipidemia) 08/19/2011   Type 2 diabetes mellitus with hyperglycemia (HCC) 08/19/2011   Coronary artery disease 08/03/2011   Past Medical History:  Past Medical History:  Diagnosis Date   AKI (acute kidney injury) 03/13/2020   Atrial flutter (HCC)    Coronary artery disease 08/2011   a. NSTEMI 08/2011, LHC w/ severe disease of LCx with good collaterals, lesion was high-risk for intervention due to a steep angulation of the LCx coming from the left main coronary artery as well as heavy calcifications, discused at cath conference w/ consensus advising med Rx; b. 03/2020 Cath: LM nl, LAD 8m, LCX 100p w/  R->L and L->L collats. RCA dominant, nl. EF 55%. PA 33/6/19, PCWP 8.   GERD (gastroesophageal reflux disease)    H/O brain tumor 01/2024   History of echocardiogram    a. 01/2020 Echo: EF 60-65%, no rwma. Sev elev PASP. Mod dil LA.   Hyperlipidemia    Hypertension    Myocardial infarction [facility])    Paroxysmal atrial fibrillation (HCC)    a. s/p ablation 01/2013; b CHADS2VASc => 3 (HTN, DM, vascular disease); c. on Coumadin ; d. 12/2019 Amio d/c'd 2/2 hyperthyroidism.   Poorly controlled diabetes mellitus (HCC)    RBBB    Sleep apnea    uses nightly   Past Surgical History:  Past Surgical History:  Procedure Laterality Date   ABLATION  01/29/2013   PVI and CTI by Dr Kelsie for atrial flutter and paroxysmal atrial fibrillation   ANTERIOR CERVICAL DECOMP/DISCECTOMY FUSION N/A 04/18/2018   Procedure: ANTERIOR CERVICAL DECOMPRESSION/DISCECTOMY FUSION 2 LEVELS C5-7;  Surgeon: Clois Fret, MD;  Location: [facility] ORS;  Service: Neurosurgery;  Laterality: N/A;   APPLICATION OF CRANIAL NAVIGATION Left 01/29/2024   Procedure: COMPUTER-ASSISTED NAVIGATION, FOR CRANIAL PROCEDURE;  Surgeon: Rosslyn Dino HERO, MD;  Location: [facility] OR;  Service: Neurosurgery;  Laterality: Left;   ATRIAL FIBRILLATION ABLATION N/A 01/29/2013   Procedure: ATRIAL FIBRILLATION ABLATION;  Surgeon: Lynwood JONETTA Kelsie, MD;  Location: [facility] CATH LAB;  Service: Cardiovascular;  Laterality: N/A;   ATRIAL FIBRILLATION ABLATION N/A 11/15/2021   Procedure: ATRIAL FIBRILLATION ABLATION;  Surgeon: Cindie Ole DASEN, MD;  Location: [facility] INVAS[facility] CV LAB;  Service: Cardiovascular;  Laterality: N/A;   CARDIAC CATHETERIZATION  08/03/2011  COLONOSCOPY WITH PROPOFOL  N/A 02/02/2016   Procedure: COLONOSCOPY WITH PROPOFOL ;  Surgeon: Rogelia Copping, MD;  Location: [facility] ENDOSCOPY;  Service: Endoscopy;  Laterality: N/A;   COLONOSCOPY WITH PROPOFOL  N/A 04/20/2023   Procedure: COLONOSCOPY WITH PROPOFOL ;  Surgeon: Copping Rogelia, MD;  Location: [facility] ENDOSCOPY;   Service: Endoscopy;  Laterality: N/A;   CRANIOTOMY Left 01/29/2024   Procedure: CRANIOTOMY TUMOR EXCISION;  Surgeon: Rosslyn Dino HERO, MD;  Location[facility] OR;  Service: Neurosurgery;  Laterality: Left;  LEFT TEMPORAL MASS RESCETION   LUMBAR LAMINECTOMY/DECOMPRESSION MICRODISCECTOMY N/A 06/07/2017   Procedure: LUMBAR LAMINECTOMY/DECOMPRESSION MICRODISCECTOMY 2 LEVELS-L3-4,L4-5;  Surgeon: Clois Fret, MD;  Location: [facility] ORS;  Service: Neurosurgery;  Laterality: N/A;   POLYPECTOMY  04/20/2023   Procedure: POLYPECTOMY;  Surgeon: Copping Rogelia, MD;  Location: [facility] ENDOSCOPY;  Service: Endoscopy;;   RIGHT/LEFT HEART CATH AND CORONARY ANGIOGRAPHY N/A 03/06/2020   Procedure: RIGHT/LEFT HEART CATH AND CORONARY ANGIOGRAPHY;  Surgeon: Perla Evalene PARAS, MD;  Location: [facility] INVASIVE CV LAB;  Service: Cardiovascular;  Laterality: N/A;   TEE WITHOUT CARDIOVERSION N/A 01/28/2013   Procedure: TRANSESOPHAGEAL ECHOCARDIOGRAM (TEE);  Surgeon: Redell GORMAN Shallow, MD;  Location: [facility] ENDOSCOPY;  Service: Cardiovascular;  Laterality: N/A;   HPI:  68 yo male presenting 10/27 for elective tumor resection. [facility] 10/22 shows large vasogenic edema throughout the L temporal lobe with mild mass effect and 3 mm R to L midline shift, 2/4 cm cystic mass in the L temporal lobe. PMH includes AKI, atrial flutter, CAD, GERD, HLD, HTN, MI, DM   Assessment / Plan / Recommendation Clinical Impression  Pt presents with both expressive and receptive language deficits. His wife reports he was independent PTA despite reports of cognitive decline per chart review. He demonstrates fluent speech marked by perseveration and phonemic paraphasias, of which he is generally unaware. This is further impacted by difficulty sustaining attention to tasks. He follows commands in ~50% of opportunities, though often needs repetition or modeling before he is able to do so. Pt exhibits awareness during functional tasks, such as self feeding and is able to  verbalize the location of his pain. Recommend ongoing SLP f/u to target deficits listed above. Will continue following acutely.    SLP Assessment  SLP Recommendation/Assessment: Patient needs continued Speech Language Pathology Services SLP Visit Diagnosis: Aphasia (R47.01);Cognitive communication deficit (R41.841)     Assistance Recommended at Discharge  Frequent or constant Supervision/Assistance  Functional Status Assessment Patient has had a recent decline in their functional status and demonstrates the ability to make significant improvements in function in a reasonable and predictable amount of time.  Frequency and Duration min 2x/week  2 weeks      SLP Evaluation Cognition  Overall Cognitive Status: Impaired/Different from baseline Arousal/Alertness: Awake/alert Orientation Level: Oriented to person;Disoriented to place;Disoriented to time;Disoriented to situation Attention: Sustained Sustained Attention: Impaired Sustained Attention Impairment: Verbal basic Awareness: Impaired Awareness Impairment: Intellectual impairment       Comprehension  Auditory Comprehension Overall Auditory Comprehension: Impaired Yes/No Questions: Impaired Basic Biographical Questions: 26-50% accurate Commands: Impaired One Step Basic Commands: 50-74% accurate    Expression Expression Primary Mode of Expression: Verbal Verbal Expression Overall Verbal Expression: Impaired Initiation: No impairment Automatic Speech: Name;Social Response Level of Generative/Spontaneous Verbalization: Sentence Repetition: Impaired Level of Impairment: Word level Naming: Impairment Confrontation: Impaired Verbal Errors: Semantic paraphasias;Perseveration;Not aware of errors   Oral / Motor  Oral Motor/Sensory Function Overall Oral Motor/Sensory Function: Within functional limits Motor Speech Overall Motor Speech: Appears within functional limits for tasks assessed  Damien Blumenthal, M.A.,  CCC-SLP Speech Language Pathology, Acute Rehabilitation Services  Secure Chat preferred [phone]  01/30/2024, 10:54 AM

## 2024-01-30 NOTE — Progress Notes (Signed)
 NAME:  Douglas Hunter, MRN:  [id number], DOB:  Apr 11, 1955, LOS: 1 ADMISSION DATE:  01/29/2024, CONSULTATION DATE:  10/27 REFERRING MD:  Dr. Rosslyn, CHIEF COMPLAINT:  Tumor resection   History of Present Illness:  Patient is a 68 yo M w/ pertinent PMH OSA on cpap, afib on tikosyn  and xarelto , dmt2, CKD 3, cad, htn, hld presents to [facility] on 10/27 for elective tumor resection.  Patient has been having confusion/memory issues over the past few months that has worsened recently. Came to [facility] ED on 10/22 CT head showing large vasogenic edema throughout left temporal lobe w/ mild mass effect and 3 mm right to left midline shift; 2.4 cm cystic mass in left temporal lobe. MRI showing left temporal lobe mass measuring 4.3 x 3.5 x 2.7 cm with surrounding vasogenic edema, concerning for metastatic disease vs. Primary cns neoplasm. Nsg consulted. Started on keppra and steroids.   On 10/27 patient arrived to [facility] for elective left crani and tumor resection of left brain tumor. Post op started on cleviprex for bp control. Pccm consulted.  Pertinent  Medical History   Past Medical History:  Diagnosis Date   AKI (acute kidney injury) 03/13/2020   Atrial flutter (HCC)    Coronary artery disease 08/2011   a. NSTEMI 08/2011, LHC w/ severe disease of LCx with good collaterals, lesion was high-risk for intervention due to a steep angulation of the LCx coming from the left main coronary artery as well as heavy calcifications, discused at cath conference w/ consensus advising med Rx; b. 03/2020 Cath: LM nl, LAD 55m, LCX 100p w/ R->L and L->L collats. RCA dominant, nl. EF 55%. PA 33/6/19, PCWP 8.   GERD (gastroesophageal reflux disease)    H/O brain tumor 01/2024   History of echocardiogram    a. 01/2020 Echo: EF 60-65%, no rwma. Sev elev PASP. Mod dil LA.   Hyperlipidemia    Hypertension    Myocardial infarction [facility])    Paroxysmal atrial fibrillation (HCC)    a. s/p ablation 01/2013; b CHADS2VASc => 3 (HTN, DM,  vascular disease); c. on Coumadin ; d. 12/2019 Amio d/c'd 2/2 hyperthyroidism.   Poorly controlled diabetes mellitus (HCC)    RBBB    Sleep apnea    uses nightly     Significant Hospital Events: Including procedures, antibiotic start and stop dates in addition to other pertinent events   10/27 elective tumor resection  Interim History / Subjective:  Remains on clevi drip  Objective    Blood pressure 133/64, pulse (!) 49, temperature 98.3 F (36.8 C), temperature source Oral, resp. rate 18, height 5' 10 (1.778 m), weight 115.7 kg, SpO2 94%.        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 Plan: -nsg following; appreciate recs -cont keppra; seizure precautions -cont steroids -neuro checks -limit sedating meds -clevi for  SBP <160 goal per nsg; will adjust orals to help come off -Continue neuroprotective measures- normothermia, euglycemia, HOB greater than 30, head in neutral alignment, normocapnia, normoxia -pain management  Leukocytosis Plan: -likely reactive and on steroids -trend wbc/fever curve  DMT2 Plan: -basal insulin  -ssi and cbg monitoring  Hx of OSA on cpap Plan: -given crani will hold on cpap for now -tele monitoring; consider [place] if needed  Afib on tikosyn  and xarelto  Cad Htn Hld Plan: -hold xarelto  for now; when to resume per nsg -HR currently regular 60s; tele monitoring; cont  to hold home metoprolol  and tikosyn  for now -trend electrolytes and replete as needed -cont statin -increase home norvasc  to 10 and cont benazepril ; will add hctz  Ckd 3a Mild hyponatremia Plan: -Trend BMP / urinary output -Replace electrolytes as indicated -Avoid nephrotoxic agents, ensure adequate renal perfusion    Labs   CBC: Recent Labs  Lab 01/24/24 1426 01/29/24 0826 01/29/24 2000 01/30/24 0503  WBC 11.1*  --  16.3* 18.5*  HGB 14.7 14.3 14.3 13.9  HCT 45.3 42.0 42.7 41.0  MCV 93.8  --  91.0 91.5  PLT 296  --  310 272    Basic Metabolic Panel: Recent Labs  Lab 01/24/24 1426 01/29/24 0826 01/29/24 2000 01/30/24 0503  NA 136 137 132* 134*  K 4.6 4.5 4.5 4.3  CL 103  --  104 108  CO2 23  --  16* 18*  GLUCOSE 360*  --  190* 144*  BUN 31*  --  42* 37*  CREATININE 1.43*  --  1.43* 1.38*  CALCIUM  8.9  --  8.3* 8.4*  MG  --   --  1.8  --    GFR: Estimated Creatinine Clearance: 64.4 mL/min (A) (by C-G formula based on SCr of 1.38 mg/dL (H)). Recent Labs  Lab 01/24/24 1426 01/29/24 2000 01/30/24 0503  WBC 11.1* 16.3* 18.5*    Liver Function Tests: Recent Labs  Lab 01/24/24 1426  AST 28  ALT 19  ALKPHOS 97  BILITOT 1.2  PROT 8.6*  ALBUMIN  4.0   No results for input(s): LIPASE, AMYLASE in the last 168 hours. No results for input(s): AMMONIA in the last 168 hours.  ABG    Component Value Date/Time   PHART 7.300 (L) 01/29/2024 0826   PCO2ART 37.0 01/29/2024 0826   PO2ART 142 (H) 01/29/2024 0826   HCO3 18.2 (L) 01/29/2024 0826   TCO2 19 (L) 01/29/2024 0826   ACIDBASEDEF 7.0 (H) 01/29/2024 0826   O2SAT 99 01/29/2024 0826     Coagulation Profile: No results for input(s): INR, PROTIME in the last 168 hours.  Cardiac Enzymes: No results for input(s): CKTOTAL, CKMB, CKMBINDEX, TROPONINI in the last 168 hours.  HbA1C: Hemoglobin A1C  Date/Time Value Ref Range Status  08/12/2012 02:50 AM 8.7 (H) 4.2 - 6.3 % Final     Comment:    The American Diabetes Association recommends that a primary goal of therapy should be <7% and that physicians should reevaluate the treatment regimen in patients with HbA1c values consistently >8%.   08/03/2011 03:58 AM 7.4 (H) 4.2 - 6.3 % Final    Comment:    The American Diabetes Association recommends that a primary goal of therapy should be <7% and that physicians should reevaluate the treatment regimen in patients with HbA1c values consistently >8%.    HB A1C (BAYER DCA - WAIVED)  Date/Time Value Ref Range Status  01/13/2015 08:54 AM 7.8 (H) <7.0 % Final    Comment:  Diabetic Adult            <7.0                                       Healthy Adult        4.3 - 5.7                                                           (DCCT/NGSP) American Diabetes Association's Summary of Glycemic Recommendations for Adults with Diabetes: Hemoglobin A1c <7.0%. More stringent glycemic goals (A1c <6.0%) may further reduce complications at the cost of increased risk of hypoglycemia.    Hgb A1c MFr Bld  Date/Time Value Ref Range Status  04/13/2020 03:19 PM 8.9 (H) 4.8 - 5.6 % Final    Comment:    (NOTE) Pre diabetes:          5.7%-6.4%  Diabetes:              >6.4%  Glycemic control for   <7.0% adults with diabetes     CBG: Recent Labs  Lab 01/29/24 1258 01/29/24 1704 01/29/24 1948 01/30/24 0003 01/30/24 0407  GLUCAP 192* 204* 173* 138* 141*     Past Medical History:  He,  has a past medical history of AKI (acute kidney injury) (03/13/2020), Atrial flutter (HCC), Coronary artery disease (08/2011), GERD (gastroesophageal reflux disease), H/O brain tumor (01/2024), History of echocardiogram, Hyperlipidemia, Hypertension, Myocardial infarction [facility]), Paroxysmal atrial fibrillation (HCC), Poorly controlled diabetes mellitus (HCC), RBBB, and Sleep apnea.   Surgical History:   Past Surgical History:  Procedure Laterality Date    ABLATION  01/29/2013   PVI and CTI by Dr Kelsie for atrial flutter and paroxysmal atrial fibrillation   ANTERIOR CERVICAL DECOMP/DISCECTOMY FUSION N/A 04/18/2018   Procedure: ANTERIOR CERVICAL DECOMPRESSION/DISCECTOMY FUSION 2 LEVELS C5-7;  Surgeon: Clois Fret, MD;  Location: [facility] ORS;  Service: Neurosurgery;  Laterality: N/A;   ATRIAL FIBRILLATION ABLATION N/A 01/29/2013   Procedure: ATRIAL FIBRILLATION ABLATION;  Surgeon: Lynwood JONETTA Kelsie, MD;  Location: [facility] CATH LAB;  Service: Cardiovascular;  Laterality: N/A;   ATRIAL FIBRILLATION ABLATION N/A 11/15/2021   Procedure: ATRIAL FIBRILLATION ABLATION;  Surgeon: Cindie Ole DASEN, MD;  Location: [facility] INVASIVE CV LAB;  Service: Cardiovascular;  Laterality: N/A;   CARDIAC CATHETERIZATION  08/03/2011   COLONOSCOPY WITH PROPOFOL  N/A 02/02/2016   Procedure: COLONOSCOPY WITH PROPOFOL ;  Surgeon: Rogelia Copping, MD;  Location: [facility] ENDOSCOPY;  Service: Endoscopy;  Laterality: N/A;   COLONOSCOPY WITH PROPOFOL  N/A 04/20/2023   Procedure: COLONOSCOPY WITH PROPOFOL ;  Surgeon: Copping Rogelia, MD;  Location: [facility] ENDOSCOPY;  Service: Endoscopy;  Laterality: N/A;   LUMBAR LAMINECTOMY/DECOMPRESSION MICRODISCECTOMY N/A 06/07/2017   Procedure: LUMBAR LAMINECTOMY/DECOMPRESSION MICRODISCECTOMY 2 LEVELS-L3-4,L4-5;  Surgeon: Clois Fret, MD;  Location: [facility] ORS;  Service: Neurosurgery;  Laterality: N/A;   POLYPECTOMY  04/20/2023   Procedure: POLYPECTOMY;  Surgeon: Copping Rogelia, MD;  Location: [facility] ENDOSCOPY;  Service: Endoscopy;;   RIGHT/LEFT HEART CATH AND CORONARY ANGIOGRAPHY N/A 03/06/2020   Procedure: RIGHT/LEFT HEART CATH AND CORONARY ANGIOGRAPHY;  Surgeon: Perla Evalene PARAS, MD;  Location: [facility] INVASIVE CV LAB;  Service: Cardiovascular;  Laterality: N/A;   TEE WITHOUT CARDIOVERSION N/A 01/28/2013   Procedure: TRANSESOPHAGEAL ECHOCARDIOGRAM (TEE);  Surgeon: Redell GORMAN Shallow, MD;  Location: [facility] ENDOSCOPY;  Service: Cardiovascular;  Laterality: N/A;     Social  History:   reports that he quit smoking about 16 years ago. His smoking use included cigarettes. He started smoking about 51 years ago. He has a 52.5 pack-year smoking history. He has never used smokeless tobacco. He reports that he does not currently use alcohol. He reports that he does not use drugs.   Family History:  His family history includes Cancer in his father; Diabetes in his son; Heart attack in his maternal uncle; Heart attack (age of onset: 48) in his father; Heart disease in his mother and paternal grandfather; Stroke in his mother.   Allergies No Known Allergies   Home Medications  Prior to Admission medications   Medication Sig Start Date End Date Taking? Authorizing Provider  acetaminophen  (TYLENOL ) 500 MG tablet Take 500 mg by mouth 4 (four) times daily as needed for mild pain or moderate pain.   Yes [provider]  amLODipine  (NORVASC ) 5 MG tablet TAKE 1 TABLET BY MOUTH ONCE DAILY 12/21/23  Yes Cindie Ole DASEN, MD  atorvastatin  (LIPITOR ) 80 MG tablet Take 80 mg by mouth at bedtime.   Yes [provider]  benazepril  (LOTENSIN ) 40 MG tablet TAKE 1 TABLET BY MOUTH ONCE DAILY 02/03/23  Yes Gollan, Timothy J, MD  cholecalciferol  (VITAMIN D3) 25 MCG (1000 UNIT) tablet Take 500 Units by mouth daily.   Yes [provider]  dexamethasone  (DECADRON ) 4 MG tablet Take 1 tablet (4 mg total) by mouth 2 (two) times daily. 01/25/24  Yes Janjua, Rashid M, MD  dofetilide  (TIKOSYN ) 500 MCG capsule TAKE 1 CAPSULE BY MOUTH TWICE DAILY 09/22/23  Yes Cindie Ole DASEN, MD  ezetimibe  (ZETIA ) 10 MG tablet TAKE 1 TABLET BY MOUTH ONCE DAILY 03/06/23  Yes Gollan, Timothy J, MD  furosemide  (LASIX ) 20 MG tablet TAKE 1 TABLET BY MOUTH TWICE DAILY AS NEEDED 12/29/22  Yes Gollan, Timothy J, MD  insulin  glargine (LANTUS ) 100 UNIT/ML injection Inject 50 Units into the skin 2 (two) times daily.   Yes [provider]  insulin  lispro (HUMALOG ) 100 UNIT/ML KiwkPen Inject 16-22  Units into the skin 2 (two) times daily. Sliding Scale 12/13/17 05/27/28 Yes [provider]  JARDIANCE 25 MG TABS tablet Take 12.5 mg by mouth daily.   Yes [provider]  levETIRAcetam (KEPPRA) 500 MG tablet Take 1 tablet (500 mg total) by mouth 2 (two) times daily. 01/24/24 02/23/24 Yes Ray, Nilsa, MD  metoprolol  succinate (TOPROL -XL) 25 MG 24 hr tablet TAKE 1 TABLET BY MOUTH ONCE DAILY 02/10/23  Yes Gollan, Timothy J, MD  OVER THE COUNTER MEDICATION Take 1 capsule by mouth 2 (two) times daily. Omega XL   Yes [provider]  rivaroxaban  (XARELTO ) 20 MG TABS tablet Take 1 tablet (20 mg total) by mouth daily with supper. 03/18/20  Yes Cindie Ole DASEN, MD  Continuous Blood Gluc Sensor (FREESTYLE LIBRE 14 DAY SENSOR) MISC SMARTSIG:1 Kit(s) Topical Every 2 Weeks 03/29/20   [provider]  INSULIN  SYRINGE 1CC/29G 29G X 1/2 1 ML MISC USE AS DIRECTED 05/27/15   Daphane Rosella, NP  nitroGLYCERIN  (NITROSTAT ) 0.4 MG SL tablet Place 1 tablet (0.4 mg total) under the tongue every 5 (five) minutes as needed for chest pain. 07/31/23   Gollan, Timothy J, MD  ONE TOUCH ULTRA TEST test strip TEST THREE TIMES A DAY AS DIRECTED. 02/04/15   Daphane Rosella, NP  Critical care time: 35 minutes     JD Emilio RIGGERS [facility] 01/30/2024, 7:17 AM  Please see [email or web address] for pager details.  From 7A-7P if no response, please call [phone]. After hours, please call ELink [phone].

## 2024-01-30 NOTE — Progress Notes (Signed)
 eLink Physician-Brief Progress Note Patient Name: Douglas Hunter DOB: 1956/03/27 MRN: [id number]   Date of Service  01/30/2024  HPI/Events of Note  Received IV keppra and new order for oral keppra 500 mg each last night. Net oral keppra due at 10 AM. Can take orally.   eICU Interventions  Discontinued Intravenous keppra .  Continue oral keppra 500 mg q12 hr. Sz precautions.      Intervention Category Intermediate Interventions: Other:  Jodelle ONEIDA Hutching 01/30/2024, 6:47 AM

## 2024-01-31 ENCOUNTER — Other Ambulatory Visit (HOSPITAL_COMMUNITY): Payer: Self-pay

## 2024-01-31 DIAGNOSIS — N183 Chronic kidney disease, stage 3 unspecified: Secondary | ICD-10-CM

## 2024-01-31 DIAGNOSIS — D72829 Elevated white blood cell count, unspecified: Secondary | ICD-10-CM

## 2024-01-31 DIAGNOSIS — Z9889 Other specified postprocedural states: Secondary | ICD-10-CM

## 2024-01-31 DIAGNOSIS — I480 Paroxysmal atrial fibrillation: Secondary | ICD-10-CM

## 2024-01-31 LAB — CBC
HCT: 43.4 % (ref 39.0–52.0)
Hemoglobin: 14.7 g/dL (ref 13.0–17.0)
MCH: 30.9 pg (ref 26.0–34.0)
MCHC: 33.9 g/dL (ref 30.0–36.0)
MCV: 91.2 fL (ref 80.0–100.0)
Platelets: 272 K/uL (ref 150–400)
RBC: 4.76 MIL/uL (ref 4.22–5.81)
RDW: 13.3 % (ref 11.5–15.5)
WBC: 20 K/uL — ABNORMAL HIGH (ref 4.0–10.5)
nRBC: 0 % (ref 0.0–0.2)

## 2024-01-31 LAB — GLUCOSE, CAPILLARY
Glucose-Capillary: 308 mg/dL — ABNORMAL HIGH (ref 70–99)
Glucose-Capillary: 382 mg/dL — ABNORMAL HIGH (ref 70–99)
Glucose-Capillary: 356 mg/dL — ABNORMAL HIGH (ref 70–99)

## 2024-01-31 LAB — TRIGLYCERIDES: Triglycerides: 68 mg/dL (ref <150)

## 2024-01-31 MED ORDER — INSULIN ASPART 100 UNIT/ML IJ SOLN: 15.0000 [IU] | Freq: Once | INTRAMUSCULAR | Status: DC

## 2024-01-31 MED ORDER — HYDRALAZINE HCL 50 MG PO TABS
50.0000 mg | ORAL_TABLET | Freq: Three times a day (TID) | ORAL | Status: DC
  Administered 2024-01-31: 50 mg via ORAL
  Filled 2024-01-31: qty 1

## 2024-01-31 MED ORDER — DEXAMETHASONE 4 MG PO TABS: 4.0000 mg | ORAL_TABLET | Freq: Three times a day (TID) | ORAL | 3 refills | Status: DC

## 2024-01-31 MED ORDER — DEXAMETHASONE 4 MG PO TABS
4.0000 mg | ORAL_TABLET | Freq: Three times a day (TID) | ORAL | 3 refills | Status: DC
  Filled 2024-01-31: qty 90, 30d supply, fill #0

## 2024-01-31 MED ORDER — AMLODIPINE BESYLATE 10 MG PO TABS: 10.0000 mg | ORAL_TABLET | Freq: Every day | ORAL | 3 refills | Status: DC

## 2024-01-31 MED ORDER — BACLOFEN 10 MG PO TABS
10.0000 mg | ORAL_TABLET | Freq: Two times a day (BID) | ORAL | 0 refills | Status: AC | PRN
  Filled 2024-01-31: qty 60, 30d supply, fill #0

## 2024-01-31 MED ORDER — PANTOPRAZOLE SODIUM 40 MG PO TBEC
40.0000 mg | DELAYED_RELEASE_TABLET | Freq: Every day | ORAL | 3 refills | Status: DC
  Filled 2024-01-31: qty 30, 30d supply, fill #0

## 2024-01-31 MED ORDER — RIVAROXABAN 20 MG PO TABS
20.0000 mg | ORAL_TABLET | Freq: Every day | ORAL | 3 refills | Status: AC
  Filled 2024-01-31: qty 30, 30d supply, fill #0

## 2024-01-31 MED ORDER — INSULIN GLARGINE-YFGN 100 UNIT/ML ~~LOC~~ SOLN
50.0000 [IU] | Freq: Two times a day (BID) | SUBCUTANEOUS | Status: DC
  Filled 2024-01-31: qty 0.5

## 2024-01-31 MED ORDER — BACLOFEN 10 MG PO TABS: 10.0000 mg | ORAL_TABLET | Freq: Two times a day (BID) | ORAL | 0 refills | Status: DC

## 2024-01-31 MED ORDER — INSULIN GLARGINE-YFGN 100 UNIT/ML ~~LOC~~ SOLN
20.0000 [IU] | Freq: Once | SUBCUTANEOUS | Status: AC
  Administered 2024-01-31: 20 [IU] via SUBCUTANEOUS
  Filled 2024-01-31: qty 0.2

## 2024-01-31 MED ORDER — BACLOFEN 10 MG PO TABS: 10.0000 mg | ORAL_TABLET | Freq: Two times a day (BID) | ORAL | Status: DC | PRN

## 2024-01-31 MED ORDER — METOPROLOL SUCCINATE ER 25 MG PO TB24
25.0000 mg | ORAL_TABLET | Freq: Every day | ORAL | Status: DC
Start: 2024-01-31 — End: 2024-01-31
  Administered 2024-01-31: 25 mg via ORAL
  Filled 2024-01-31: qty 1

## 2024-01-31 MED ORDER — PANTOPRAZOLE SODIUM 40 MG PO TBEC: 40.0000 mg | DELAYED_RELEASE_TABLET | Freq: Every day | ORAL | 3 refills | Status: DC

## 2024-01-31 MED ORDER — BUTALBITAL-APAP-CAFFEINE 50-325-40 MG PO TABS
2.0000 | ORAL_TABLET | ORAL | 0 refills | Status: DC | PRN
  Filled 2024-01-31: qty 14, 3d supply, fill #0

## 2024-01-31 MED ORDER — HYDRALAZINE HCL 25 MG PO TABS: 25.0000 mg | ORAL_TABLET | Freq: Three times a day (TID) | ORAL | Status: DC

## 2024-01-31 MED ORDER — RIVAROXABAN 20 MG PO TABS: 20.0000 mg | ORAL_TABLET | Freq: Every day | ORAL | 3 refills | Status: DC

## 2024-01-31 MED ORDER — AMLODIPINE BESYLATE 10 MG PO TABS
10.0000 mg | ORAL_TABLET | Freq: Every day | ORAL | 3 refills | Status: DC
  Filled 2024-01-31: qty 30, 30d supply, fill #0

## 2024-01-31 NOTE — Evaluation (Signed)
 Physical Therapy Evaluation Patient Details Name: Douglas Hunter MRN: [id number] DOB: [birth year] Today's Date: 01/31/2024  History of Present Illness  68 y.o. male presents to [facility] hospital on 01/29/2024 for resection of L temporal lobe mass. PMH includes OSA, afib, DMII, CKD III, CAD, HTN, HLD.  Clinical Impression  Pt presents to PT with deficits in cognition, communication, gait, balance. Pt demonstrates lateral and posterior instability when standing and mobilizing without UE support. With use of a RW the pt's balance improves. Pt benefits from use of gestures or visual cues in addition to verbal cueing due to communication deficits. PT recommends the pt have assistance from family for all out of bed mobility upon return home and to utilize the RW for all out of bed mobility. PT provides the pt's spouse with a gait belt to assist in mobilizing the patient. Pt and family prefer discharge home, outpatient PT recommended along with a RW and BSC.        If plan is discharge home, recommend the following: A little help with walking and/or transfers;A lot of help with bathing/dressing/bathroom;Assistance with cooking/housework;Direct supervision/assist for medications management;Direct supervision/assist for financial management;Assist for transportation;Help with stairs or ramp for entrance;Supervision due to cognitive status   Can travel by private vehicle        Equipment Recommendations Rolling walker (2 wheels);BSC/3in1  Recommendations for Other Services       Functional Status Assessment Patient has had a recent decline in their functional status and demonstrates the ability to make significant improvements in function in a reasonable and predictable amount of time.     Precautions / Restrictions Precautions Precautions: Fall Recall of Precautions/Restrictions: Impaired Precaution/Restrictions Comments: spouse aware of restricitons and need of assistance for all out of bed  activity Restrictions Weight Bearing Restrictions Per Provider Order: No      Mobility  Bed Mobility Overal bed mobility: Modified Independent                  Transfers Overall transfer level: Needs assistance Equipment used: None, Rolling walker (2 wheels) Transfers: Sit to/from Stand Sit to Stand: Contact guard assist, Min assist           General transfer comment: minA without DME initially, CGA with RW for subsequent stands    Ambulation/Gait Ambulation/Gait assistance: Min assist, Contact guard assist Gait Distance (Feet): 300 Feet (initial 100' without DME requiring minA, next 250' with RW at [facility] level, final 48' without DME requiring minA) Assistive device: Rolling walker (2 wheels), None Gait Pattern/deviations: Step-through pattern, Drifts right/left Gait velocity: reduced Gait velocity interpretation: <1.8 ft/sec, indicate of risk for recurrent falls   General Gait Details: pt with increased lateral sway and multiple lateral and posterior losses of balance when ambulating without UE support. Pt with improved stability with use of RW  Stairs Stairs:  (pt appears to express fatigue prior during 2nd bout of ambulation, stair negotiation deferred due to fatigue)          Wheelchair Mobility     Tilt Bed    Modified Rankin (Stroke Patients Only)       Balance Overall balance assessment: Needs assistance Sitting-balance support: No upper extremity supported, Feet supported Sitting balance-[name] Scale: Good     Standing balance support: Single extremity supported, Reliant on assistive device for balance Standing balance-[name] Scale: Poor  Pertinent Vitals/Pain Pain Assessment Pain Assessment: No/denies pain    Home Living Family/patient expects to be discharged to:: Private residence Living Arrangements: Spouse/significant other Available Help at Discharge: Family;Available 24 hours/day Type of  Home: House Home Access: Stairs to enter Entrance Stairs-Rails: Doctor, General Practice of Steps: 2   Home Layout: One level Home Equipment: Shower seat - built in;Cane - single point;Crutches;[employer], [employer] Comments: [place]    Prior Function Prior Level of Function : Independent/Modified Independent;Driving                     Extremity/Trunk Assessment   Upper Extremity Assessment Upper Extremity Assessment: LUE deficits/detail LUE Deficits / Details: pt with prior L rotator cuff injury limiting shoulder strenght at baseline, 4/5 based on observation    Lower Extremity Assessment Lower Extremity Assessment: Generalized weakness    Cervical / Trunk Assessment Cervical / Trunk Assessment: Normal  Communication   Communication Communication: Impaired Factors Affecting Communication: Difficulty expressing self    Cognition Arousal: Alert Behavior During Therapy: WFL for tasks assessed/performed   PT - Cognitive impairments: No apparent impairments, Awareness, Sequencing, Initiation, Problem solving, Safety/Judgement                         Following commands: Impaired Following commands impaired: Follows one step commands inconsistently     Cueing Cueing Techniques: Verbal cues, Visual cues, Gestural cues     General Comments General comments (skin integrity, edema, etc.): VSS on RA    Exercises     Assessment/Plan    PT Assessment Patient needs continued PT services  PT Problem List Decreased strength;Decreased activity tolerance;Decreased balance;Decreased mobility;Decreased knowledge of use of DME;Decreased safety awareness;Decreased knowledge of precautions       PT Treatment Interventions DME instruction;Gait training;Stair training;Functional mobility training;Therapeutic activities;Therapeutic exercise;Balance training;Neuromuscular re-education;Cognitive remediation;Patient/family education     PT Goals (Current goals can be found in the Care Plan section)  Acute Rehab PT Goals Patient Stated Goal: to return to independence PT Goal Formulation: With patient/family Time For Goal Achievement: 02/14/24 Potential to Achieve Goals: Fair    Frequency Min 2X/week     Co-evaluation               AM-PAC PT 6 Clicks Mobility  Outcome Measure Help needed turning from your back to your side while in a flat bed without using bedrails?: None Help needed moving from lying on your back to sitting on the side of a flat bed without using bedrails?: None Help needed moving to and from a bed to a chair (including a wheelchair)?: A Little Help needed standing up from a chair using your arms (e.g., wheelchair or bedside chair)?: A Little Help needed to walk in hospital room?: A Little Help needed climbing 3-5 steps with a railing? : A Lot 6 Click Score: 19    End of Session Equipment Utilized During Treatment: Gait belt Activity Tolerance: Patient tolerated treatment well Patient left: in bed;with call bell/phone within reach;with bed alarm set;with family/visitor present Nurse Communication: Mobility status PT Visit Diagnosis: Other abnormalities of gait and mobility (R26.89);Other symptoms and signs involving the nervous system (R29.898)    Time: 9097-9059 PT Time Calculation (min) (ACUTE ONLY): 38 min   Charges:   PT Evaluation $PT Eval Low Complexity: 1 Low   PT General Charges $$ ACUTE PT VISIT: 1 Visit         Bernardino JINNY Ruth, PT, DPT  Acute Rehabilitation Office [phone]   Bernardino JINNY Ruth 01/31/2024, 12:01 PM

## 2024-01-31 NOTE — Progress Notes (Incomplete)
 Attending note: I have seen and examined the patient. History, labs and imaging reviewed.   Stable, off clelveprix   Blood pressure (!) 135/93, pulse 91, temperature 98.9 F (37.2 C), temperature source Oral, resp. rate (!) 21, height 5' 10 (1.778 m), weight 115.7 kg, SpO2 96%. Gen:      No acute distress HEENT:  EOMI, sclera anicteric Neck:     No masses; no thyromegaly Lungs:    Clear to auscultation bilaterally; normal respiratory effort*** CV:         Regular rate and rhythm; no murmurs Abd:      + bowel sounds; soft, non-tender; no palpable masses, no distension Ext:    No edema; adequate peripheral perfusion Neuro: alert and oriented x 3 Psych: normal mood and affect   Labs/Imaging personally reviewed, significant for WBC higher  Assessment/plan: Left t Continue keppra, decadron  PO HTN meds  DM SSI coverage Sem,gle  Stable for discharge Check with NSGY about resumption of xarelto    The patient is critically ill with multiple organ systems failure and requires high complexity decision making for assessment and support, frequent evaluation and titration of therapies, application of advanced monitoring technologies and extensive interpretation of multiple databases.  Critical care time - 35 mins. This represents my time independent of the NPs time taking care of the pt.  Kaja Jackowski MD [facility] 01/31/2024, 9:18 AM

## 2024-01-31 NOTE — TOC Transition Note (Signed)
 Transition of Care [facility]) - Discharge Note   Patient Details  Name: Douglas Hunter MRN: [id number] Date of Birth: November 21, 1955  Transition of Care [facility]) CM/SW Contact:  Trenise Turay M, RN Phone Number: 01/31/2024, 12:06 PM   Clinical Narrative:    68 y.o. male presents to [facility] hospital on 01/29/2024 for resection of L temporal lobe mass.  PTA, patient independent and living at home with spouse, who can provide 24h assistance at dc.  Wife states they have two sons who live 10 minutes away.  PT/OT and ST recommending continued therapies at dc; pt/wife prefer [facility] for follow up.  Referrals placed for outpatient PT/OT and ST accordingly.  RW and BSC are also recommended; referral to [facility] for DME, to be delivered to bedside prior to dc.    Final next level of care: OP Rehab Barriers to Discharge: Barriers Resolved                                  Discharge Plan and Services Additional resources added to the After Visit Summary for     Discharge Planning Services: CM Consult            DME Arranged: Vannie rolling, Bedside commode DME Agency: [facility] Date DME Agency Contacted: 01/31/24 Time DME Agency Contacted: 1203 Representative spoke with at DME Agency: Ryan Breed            Social Drivers of Health (SDOH) Interventions SDOH Screenings   Food Insecurity: No Food Insecurity (01/31/2023)   Received from [facility]  Housing: Unknown (12/14/2023)   Received from [facility]  Transportation Needs: No Transportation Needs (01/31/2023)   Received from [facility]  Utilities: Not At Risk (01/31/2023)   Received from [facility]  Financial Resource Strain: Low Risk  (01/31/2023)   Received from [facility]  Tobacco Use: Medium Risk (01/29/2024)     Readmission Risk Interventions     No data to display         Mliss MICAEL Fass, RN, BSN   Trauma/Neuro ICU Case Manager [phone]

## 2024-01-31 NOTE — Discharge Summary (Cosign Needed Addendum)
 Physician Discharge Summary   Patient: Douglas Hunter MRN: [id number] DOB: 01/29/1956  Admit date:     01/29/2024  Discharge date: 01/31/24  Discharge Physician: Rexene LOISE Blush   PCP: [name], MD   Recommendations at discharge:    Discharge Instructions      Continue home medications except for Xarelto  which can increase risk for bleeding after surgery. You can resume your home Xarelto  as prescribed on 02/08/2024. Tylenol  for mild pain, and baclofen  as needed for neck pain Continue Decadron  4 mg every 8 hours by mouth  Continue Keppra 500 mg twice daily  Take constipation medications so that you are not straining, some combination of over-the-counter miralax, senna, prune juice, or whatever works for you at home No showering, washing hair, or getting incision wet until you see Dr. Rosslyn in clinic Not uncommon to have swelling to the side of the surgery; stay upright during day if able, can use a wrapped ice pack for 10-15 minutes at a time Monitor incision site drainage, notify Dr. Catarino office if increasing drainage or looks different If any tissue samples were taken, Dr. Rosslyn will discuss results with you in clinic Mobilize as able, this is very important for overall recovery  Blood sugars may be elevated because you're on a steroid called decadron . If not controlled by your home regimen and consistently less than 140 or greater than 180 please notify the physician who manages your insulin .      Discharge Diagnoses: Principal Problem:   Status post craniotomy Active Problems:   Brain tumor (HCC)   Brain mass  Resolved Problems:   * No resolved hospital problems. [facility] Course: Patient admitted on 01/29/2024 for left craniotomy for elective resection of brain tumor.  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 Postop scan and exam showed no complications. Ambulating, pain controlled, tolerating diet, and  voiding at time of discharge. Will follow up in [facility] clinic with instructions provided as above. Post-hospitalization questions to be answered by [facility] Neurosurgery team. Continue home Keppra. Continue increased decadron .   Paroxysmal afib  CAD HTN  HLD Continue increased home amlodipine . Continue home benazepril , metoprolol  and dofetilide . Hold home Xarelto  for 10 days post-operatively, can resume as prescribed on 02/08/2024.   CKD stage III Cr remained at recent baseline throughout admission.   Leukocytosis Likely reactive due to OR and steroids. Afebrile and no localizing signs of infection.  T2DM  Resume home insulin  regimen and jardiance. Monitor BG closely with CBG goal 140-180.   History of OSA Continue home CPAP.   Disposition: Home  DISCHARGE MEDICATION: Allergies as of 01/31/2024   No Known Allergies      Medication List     TAKE these medications    acetaminophen  500 MG tablet Commonly known as: TYLENOL  Take 500 mg by mouth 4 (four) times daily as needed for mild pain or moderate pain.   amLODipine  10 MG tablet Commonly known as: NORVASC  Take 1 tablet (10 mg total) by mouth daily. Start taking on: February 01, 2024 What changed:  medication strength how much to take   atorvastatin  80 MG tablet Commonly known as: LIPITOR  Take 80 mg by mouth at bedtime.   baclofen  10 MG tablet Commonly known as: LIORESAL  Take 1 tablet (10 mg total) by mouth 2 (two) times daily as needed (neck pain, muscle spasms).   benazepril  40 MG tablet Commonly known as: LOTENSIN  TAKE 1 TABLET BY  MOUTH ONCE DAILY   butalbital-acetaminophen -caffeine 50-325-40 MG tablet Commonly known as: FIORICET Take 2 tablets by mouth every 4 (four) hours as needed for headache (Moderate pain).   cholecalciferol  25 MCG (1000 UNIT) tablet Commonly known as: VITAMIN D3 Take 500 Units by mouth daily.   dexamethasone  4 MG tablet Commonly known as: DECADRON  Take 1 tablet (4 mg  total) by mouth every 8 (eight) hours. What changed: when to take this   dofetilide  500 MCG capsule Commonly known as: TIKOSYN  TAKE 1 CAPSULE BY MOUTH TWICE DAILY   ezetimibe  10 MG tablet Commonly known as: ZETIA  TAKE 1 TABLET BY MOUTH ONCE DAILY   FreeStyle Libre 14 Day Sensor Misc SMARTSIG:1 Kit(s) Topical Every 2 Weeks   furosemide  20 MG tablet Commonly known as: LASIX  TAKE 1 TABLET BY MOUTH TWICE DAILY AS NEEDED   insulin  glargine 100 UNIT/ML injection Commonly known as: LANTUS  Inject 50 Units into the skin 2 (two) times daily.   insulin  lispro 100 UNIT/ML KiwkPen Commonly known as: HUMALOG  Inject 16-22 Units into the skin 2 (two) times daily. Sliding Scale   INSULIN  SYRINGE 1CC/29G 29G X 1/2 1 ML Misc USE AS DIRECTED   Jardiance 25 MG Tabs tablet Generic drug: empagliflozin Take 12.5 mg by mouth daily.   levETIRAcetam 500 MG tablet Commonly known as: Keppra Take 1 tablet (500 mg total) by mouth 2 (two) times daily.   metoprolol  succinate 25 MG 24 hr tablet Commonly known as: TOPROL -XL TAKE 1 TABLET BY MOUTH ONCE DAILY   nitroGLYCERIN  0.4 MG SL tablet Commonly known as: NITROSTAT  Place 1 tablet (0.4 mg total) under the tongue every 5 (five) minutes as needed for chest pain.   ONE TOUCH ULTRA TEST test strip Generic drug: glucose blood TEST THREE TIMES A DAY AS DIRECTED.   OVER THE COUNTER MEDICATION Take 1 capsule by mouth 2 (two) times daily. Omega XL   pantoprazole  40 MG tablet Commonly known as: PROTONIX  Take 1 tablet (40 mg total) by mouth daily. Stop taking if no longer on decadron . Start taking on: February 01, 2024   Xarelto  20 MG Tabs tablet Generic drug: rivaroxaban  Take 1 tablet (20 mg total) by mouth daily with supper. Start taking on: February 08, 2024 What changed: These instructions start on February 08, 2024. If you are unsure what to do until then, ask your doctor or other care provider.               Durable Medical Equipment   (From admission, onward)           Start     Ordered   01/31/24 1234  For home use only DME Walker rolling  Once       Question Answer Comment  Walker: With 5 Inch Wheels   Patient needs a walker to treat with the following condition Status post craniotomy      01/31/24 1233   01/31/24 1234  For home use only DME Bedside commode  Once       Question:  Patient needs a bedside commode to treat with the following condition  Answer:  Status post craniotomy   01/31/24 1233            Follow-up Information     [facility]. Call.   Specialty: Rehabilitation Why: Call ASAP to arrange outpatient physical, occupational and speech therapies.  An electronic referral has been sent on your behalf. Contact information: [address] [place]  [phone]  [phone]                Discharge Exam: General: no acute distress HEENT: [place]/AT, incision site CDI, sclera anicteric Respiratory: Breathing comfortably on room air, clear to auscultation bilaterally CV: regular rate and rhythm, normal S1 and S2, no m/r/g Abd: soft, non-tender, non-distended Neuro: alert and oriented x 1-2, person and place. Some word finding difficulty. Ambulated well with walker. Moves all extremities with good strength throughout.   Condition at discharge: Stable  Time spent on discharge: 30 minutes    Rexene LOISE Blush, [place] [facility] 01/31/24 2:35 PM  Please see [email or web address] for pager details.  From 7A-7P if no response, please call [phone] After hours, please call ELink [phone]

## 2024-01-31 NOTE — Discharge Instructions (Addendum)
 Continue home medications except for Xarelto  which can increase risk for bleeding after surgery. You can resume your home Xarelto  as prescribed on 02/08/2024. Tylenol  for mild pain, and baclofen  as needed for neck pain Continue Decadron  4 mg every 8 hours by mouth  Continue Keppra 500 mg twice daily  Take constipation medications so that you are not straining, some combination of over-the-counter miralax, senna, prune juice, or whatever works for you at home No showering, washing hair, or getting incision wet until you see Dr. Rosslyn in clinic Not uncommon to have swelling to the side of the surgery; stay upright during day if able, can use a wrapped ice pack for 10-15 minutes at a time Monitor incision site drainage, notify Dr. Catarino office if increasing drainage or looks different If any tissue samples were taken, Dr. Rosslyn will discuss results with you in clinic Mobilize as able, this is very important for overall recovery  Blood sugars may be elevated because you're on a steroid called decadron . If not controlled by your home regimen and consistently less than 140 or greater than 180 please notify the physician who manages your insulin .

## 2024-01-31 NOTE — Progress Notes (Signed)
 Pt being d/c.  PIVs removed Discharge information given to wife and pt Pt and wife at bedside verb understanding and had no further questions  Wheel chaired pt out to vehicle.

## 2024-01-31 NOTE — Evaluation (Signed)
 Occupational Therapy Evaluation Patient Details Name: Douglas Hunter MRN: [id number] DOB: [birth year] Today's Date: 01/31/2024   History of Present Illness   68 y.o. male presents to [facility] hospital on 01/29/2024 for resection of L temporal lobe mass. PMH includes OSA, afib, DMII, CKD III, CAD, HTN, HLD.     Clinical Impressions Patient is s/p L temporal mass resection surgery resulting in functional limitations due to the deficits listed below (see OT problem list). 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 Pt currently with high fall risk with posterior bias. Pt with expressive deficits and needs increased time to demonstrate needs during session. Wife present and declines AIR referral when educated.   Patient will benefit from skilled OT acutely to increase independence and safety with ADLS to allow discharge outpatient.      If plan is discharge home, recommend the following:   A little help with walking and/or transfers;A little help with bathing/dressing/bathroom     Functional Status Assessment   Patient has had a recent decline in their functional status and demonstrates the ability to make significant improvements in function in a reasonable and predictable amount of time.     Equipment Recommendations   BSC/3in1;Other (comment) (RW)     Recommendations for Other Services    (declines AIR)     Precautions/Restrictions   Precautions Precautions: Fall     Mobility Bed Mobility Overal bed mobility: Modified Independent             General bed mobility comments: exiting on the left side    Transfers Overall transfer level: Needs assistance Equipment used: Rolling walker (2 wheels) Transfers: Sit to/from Stand Sit to Stand: Min assist           General transfer comment: cues for safety with RW      Balance Overall balance assessment: Needs assistance Sitting-balance support: Bilateral upper extremity  supported, Feet supported Sitting balance-[name] Scale: Fair     Standing balance support: Bilateral upper extremity supported, During functional activity, Reliant on assistive device for balance Standing balance-[name] Scale: Poor                             ADL either performed or assessed with clinical judgement   ADL Overall ADL's : Needs assistance/impaired     Grooming: Wash/dry hands;Moderate assistance;Standing   Upper Body Bathing: Sitting;Minimal assistance   Lower Body Bathing: Moderate assistance;Sit to/from stand   Upper Body Dressing : Minimal assistance;Sitting   Lower Body Dressing: Moderate assistance;Sit to/from stand   Toilet Transfer: Minimal assistance;Regular Toilet;Rolling walker (2 wheels);Grab bars;Ambulation   Toileting- Clothing Manipulation and Hygiene: Minimal assistance;Sit to/from stand       Functional mobility during ADLs: Minimal assistance;Rolling walker (2 wheels) General ADL Comments: pt with posterior bias for falls     Vision Baseline Vision/History: 1 Wears glasses Vision Assessment?: Yes Eye Alignment: Within Functional Limits Ocular Range of Motion: Within Functional Limits Tracking/Visual Pursuits: Able to track stimulus in all quads without difficulty Convergence: Within functional limits Additional Comments: pt without deficits noted during functional task or visual tracking at this time     Perception         Praxis         Pertinent Vitals/Pain Pain Assessment Pain Assessment: No/denies pain     Extremity/Trunk Assessment Upper Extremity Assessment Upper Extremity Assessment: Overall WFL for tasks assessed (edema noted throughout the arm  BIL)   Lower Extremity Assessment Lower Extremity Assessment: Defer to PT evaluation   Cervical / Trunk Assessment Cervical / Trunk Assessment: Normal   Communication Communication Communication: Impaired Factors Affecting Communication: Difficulty expressing  self   Cognition Arousal: Alert Behavior During Therapy: WFL for tasks assessed/performed Cognition: Cognition impaired     Awareness: Intellectual awareness intact, Online awareness impaired       OT - Cognition Comments: pt with language deficits but was apporpriate for task assessed. pt needs redirection at times. decreased awareness of fall risk                 Following commands: Intact       Cueing  General Comments      VSS on RA   Exercises     Shoulder Instructions      Home Living Family/patient expects to be discharged to:: Private residence Living Arrangements: Spouse/significant other Available Help at Discharge: Family;Available 24 hours/day Type of Home: House             Bathroom Shower/Tub: Walk-in Soil Scientist Toilet: Handicapped height     Home Equipment: Shower seat - built in   Additional Comments: animals in paster [place] acres      Prior Functioning/Environment Prior Level of Function : Independent/Modified Independent;Driving                    OT Problem List: Decreased strength;Decreased activity tolerance;Impaired balance (sitting and/or standing);Decreased safety awareness;Decreased knowledge of use of DME or AE;Decreased knowledge of precautions;Cardiopulmonary status limiting activity   OT Treatment/Interventions: Self-care/ADL training;DME and/or AE instruction;Therapeutic activities;Balance training;Patient/family education;Energy conservation      OT Goals(Current goals can be found in the care plan section)   Acute Rehab OT Goals Patient Stated Goal: to go home OT Goal Formulation: With patient/family Time For Goal Achievement: 02/14/24 Potential to Achieve Goals: Good   OT Frequency:  Min 2X/week    Co-evaluation              AM-PAC OT 6 Clicks Daily Activity     Outcome Measure Help from another person eating meals?: A Little Help from another person taking care of personal grooming?:  A Little Help from another person toileting, which includes using toliet, bedpan, or urinal?: A Lot Help from another person bathing (including washing, rinsing, drying)?: A Lot Help from another person to put on and taking off regular upper body clothing?: A Little Help from another person to put on and taking off regular lower body clothing?: A Lot 6 Click Score: 15   End of Session Equipment Utilized During Treatment: Rolling walker (2 wheels);Gait belt Nurse Communication: Mobility status;Precautions  Activity Tolerance: Patient tolerated treatment well Patient left: in bed;with call bell/phone within reach;with bed alarm set  OT Visit Diagnosis: Unsteadiness on feet (R26.81);Muscle weakness (generalized) (M62.81)                Time: 8978-8958 OT Time Calculation (min): 20 min Charges:  OT General Charges $OT Visit: 1 Visit OT Evaluation $OT Eval Moderate Complexity: 1 Mod   Brynn, OTR/L  Acute Rehabilitation Services Office: [phone] .   Ely Molt 01/31/2024, 11:24 AM

## 2024-02-01 ENCOUNTER — Other Ambulatory Visit: Payer: Self-pay | Admitting: Radiation Therapy

## 2024-02-01 ENCOUNTER — Other Ambulatory Visit: Payer: Self-pay

## 2024-02-01 ENCOUNTER — Ambulatory Visit: Attending: Neurosurgery | Admitting: Speech Pathology

## 2024-02-01 DIAGNOSIS — G9389 Other specified disorders of brain: Secondary | ICD-10-CM | POA: Diagnosis not present

## 2024-02-01 DIAGNOSIS — R4701 Aphasia: Secondary | ICD-10-CM | POA: Diagnosis present

## 2024-02-01 DIAGNOSIS — D496 Neoplasm of unspecified behavior of brain: Secondary | ICD-10-CM

## 2024-02-01 DIAGNOSIS — R41841 Cognitive communication deficit: Secondary | ICD-10-CM | POA: Insufficient documentation

## 2024-02-01 NOTE — Therapy (Signed)
 OUTPATIENT SPEECH LANGUAGE PATHOLOGY   EVALUATION   Patient Name: Douglas Hunter MRN: [id number] DOB:23-Feb-1956, 69 y.o., male Today's Date: 02/01/2024  PCP: Cheryl Jericho, MD REFERRING PROVIDER: Dino Sable, MD   End of Session - 02/01/24 1314     Visit Number 1    Number of Visits 25    Date for Recertification  04/25/24    Authorization Type [employer]    Progress Note Due on Visit 10    SLP Start Time 1315    SLP Stop Time  1400    SLP Time Calculation (min) 45 min    Activity Tolerance Patient tolerated treatment well          Past Medical History:  Diagnosis Date   AKI (acute kidney injury) 03/13/2020   Atrial flutter (HCC)    Coronary artery disease 08/2011   a. NSTEMI 08/2011, LHC w/ severe disease of LCx with good collaterals, lesion was high-risk for intervention due to a steep angulation of the LCx coming from the left main coronary artery as well as heavy calcifications, discused at cath conference w/ consensus advising med Rx; b. 03/2020 Cath: LM nl, LAD 105m, LCX 100p w/ R->L and L->L collats. RCA dominant, nl. EF 55%. PA 33/6/19, PCWP 8.   GERD (gastroesophageal reflux disease)    H/O brain tumor 01/2024   History of echocardiogram    a. 01/2020 Echo: EF 60-65%, no rwma. Sev elev PASP. Mod dil LA.   Hyperlipidemia    Hypertension    Myocardial infarction [facility])    Paroxysmal atrial fibrillation (HCC)    a. s/p ablation 01/2013; b CHADS2VASc => 3 (HTN, DM, vascular disease); c. on Coumadin ; d. 12/2019 Amio d/c'd 2/2 hyperthyroidism.   Poorly controlled diabetes mellitus (HCC)    RBBB    Sleep apnea    uses nightly   Past Surgical History:  Procedure Laterality Date   ABLATION  01/29/2013   PVI and CTI by Dr Kelsie for atrial flutter and paroxysmal atrial fibrillation   ANTERIOR CERVICAL DECOMP/DISCECTOMY FUSION N/A 04/18/2018   Procedure: ANTERIOR CERVICAL DECOMPRESSION/DISCECTOMY FUSION 2 LEVELS C5-7;  Surgeon: Clois Fret, MD;   Location: [facility] ORS;  Service: Neurosurgery;  Laterality: N/A;   APPLICATION OF CRANIAL NAVIGATION Left 01/29/2024   Procedure: COMPUTER-ASSISTED NAVIGATION, FOR CRANIAL PROCEDURE;  Surgeon: Sable Dino HERO, MD;  [facility]: [facility] OR;  Service: Neurosurgery;  Laterality: Left;   ATRIAL FIBRILLATION ABLATION N/A 01/29/2013   Procedure: ATRIAL FIBRILLATION ABLATION;  Surgeon: Lynwood JONETTA Kelsie, MD;  Location: [facility] CATH LAB;  Service: Cardiovascular;  Laterality: N/A;   ATRIAL FIBRILLATION ABLATION N/A 11/15/2021   Procedure: ATRIAL FIBRILLATION ABLATION;  Surgeon: Cindie Ole DASEN, MD;  [facility]: [facility] INVASIVE CV LAB;  Service: Cardiovascular;  Laterality: N/A;   CARDIAC CATHETERIZATION  08/03/2011   COLONOSCOPY WITH PROPOFOL  N/A 02/02/2016   Procedure: COLONOSCOPY WITH PROPOFOL ;  Surgeon: Rogelia Copping, MD;  Location: [facility] ENDOSCOPY;  Service: Endoscopy;  Laterality: N/A;   COLONOSCOPY WITH PROPOFOL  N/A 04/20/2023   Procedure: COLONOSCOPY WITH PROPOFOL ;  Surgeon: Copping Rogelia, MD;  Location: [facility] ENDOSCOPY;  Service: Endoscopy;  Laterality: N/A;   CRANIOTOMY Left 01/29/2024   Procedure: CRANIOTOMY TUMOR EXCISION;  Surgeon: Sable Dino HERO, MD;  Location: [facility] OR;  Service: Neurosurgery;  Laterality: Left;  LEFT TEMPORAL MASS RESCETION   LUMBAR LAMINECTOMY/DECOMPRESSION MICRODISCECTOMY N/A 06/07/2017   Procedure: LUMBAR LAMINECTOMY/DECOMPRESSION MICRODISCECTOMY 2 LEVELS-L3-4,L4-5;  Surgeon: Clois Fret, MD;  Location: [facility] ORS;  Service: Neurosurgery;  Laterality: N/A;   POLYPECTOMY  04/20/2023   Procedure: POLYPECTOMY;  Surgeon: Jinny Carmine, MD;  Location: [facility] ENDOSCOPY;  Service: Endoscopy;;   RIGHT/LEFT HEART CATH AND CORONARY ANGIOGRAPHY N/A 03/06/2020   Procedure: RIGHT/LEFT HEART CATH AND CORONARY ANGIOGRAPHY;  Surgeon: Perla Evalene PARAS, MD;  Location: [facility] INVASIVE CV LAB;  Service: Cardiovascular;  Laterality: N/A;   TEE WITHOUT CARDIOVERSION N/A 01/28/2013   Procedure: TRANSESOPHAGEAL ECHOCARDIOGRAM  (TEE);  Surgeon: Redell GORMAN Shallow, MD;  Location: [facility] ENDOSCOPY;  Service: Cardiovascular;  Laterality: N/A;   Patient Active Problem List   Diagnosis Date Noted   Status post craniotomy 01/29/2024   Brain tumor (HCC) 01/29/2024   Brain mass 01/29/2024   History of colon polyps 04/20/2023   Polyp of ascending colon 04/20/2023   OSA on CPAP 06/28/2022   Secondary hypercoagulable state 04/13/2020   Rapid atrial fibrillation (HCC) 03/13/2020   Postural dizziness with presyncope 03/13/2020   AKI (acute kidney injury) 03/13/2020   Metabolic acidosis 03/13/2020   Elevated troponin 03/13/2020   Atrial fibrillation with rapid ventricular response (HCC) 03/13/2020   Unstable angina (HCC) 03/06/2020   Cervical radiculopathy 04/18/2018   Obesity, unspecified 07/19/2017   Paroxysmal atrial fibrillation (HCC)    Poorly controlled diabetes mellitus (HCC)    Special screening for malignant neoplasms, colon    Polyp of sigmoid colon    Benign neoplasm of ascending colon    Erectile dysfunction 12/10/2015   Gastroesophageal reflux disease without esophagitis 12/03/2015   Snoring 01/23/2013   Atrial flutter (HCC) 08/20/2012   Chest pain 08/19/2011   HTN (hypertension) 08/19/2011   HLD (hyperlipidemia) 08/19/2011   Type 2 diabetes mellitus with hyperglycemia (HCC) 08/19/2011   Coronary artery disease 08/03/2011    ONSET DATE: 01/24/2024   REFERRING DIAG: G93.89 (ICD-10-CM) - Brain mass   THERAPY DIAG:  Aphasia  Cognitive communication deficit  Rationale for Evaluation and Treatment Rehabilitation  SUBJECTIVE:   SUBJECTIVE STATEMENT: Pt pleasant, intermittently emotional  Pt accompanied by: {accompnied:27141}  PERTINENT HISTORY: ***  DIAGNOSTIC FINDINGS: ***  PAIN:  Are you having pain? {OPRCPAIN:27236}   FALLS: Has patient fallen in last 6 months?  {DOEQJOOD:74320}  LIVING ENVIRONMENT: Lives with: {OPRC lives with:25569::lives with their family} Lives in: {Lives  in:25570}  PLOF:  Level of assistance: {DOEEONQ:74326} Employment: {SLPemployment:25674}   PATIENT GOALS   ***  OBJECTIVE:   COGNITIVE COMMUNICATION Overall cognitive status: {cognition:24006} Areas of impairment:  {ARMCcognitiveimpairment:28858} Functional Impairments: ***  AUDITORY COMPREHENSION  Overall auditory comprehension: {IMPAIRED:25374} YES/NO questions: {IMPAIRED:25374} Following directions: {IMPAIRED:25374} Conversation: {SLP conversation:25430} Interfering components: {SLP interfering components:25431} Effective technique: {SLP effective technique:25432}  READING COMPREHENSION: {SLPreadingcomprehension:27140}  EXPRESSION: {SLP EXPRESSION:25433}  VERBAL EXPRESSION:   Overall verbal expression: {IMPAIRED:25374} Level of generative/spontaneous verbalization: {SLP level of generative/spontaneious verbalization:25435} Automatic speech: {SLP ATOMIC SPEECH:25434}  Repetition: {SLPrepetion:27212} Naming: {SLPnaming:27214} Pragmatics: {slppragmatics:27216} Comments: *** Interfering components: {SLP INTERFERING COMPONENTS:25436} Effective technique: {SLP EFFECTIVE TECHNIQUE:25437} Non-verbal means of communication: {SLP non verbal means of communication:25438}  WRITTEN EXPRESSION: Dominant hand: {RIGHT/LEFT:20294} Written expression: {slpwrittenexp:27209}  ORAL MOTOR EXAMINATION Facial : {OM Face:25663} Lingual: {OM Lingual:25664} Velum: {OM Velum:25665} Mandible: {OM mandible:25667} Cough: {OM Cough:25660} Voice: {OM Voice:25662}  MOTOR SPEECH: Overall motor speech: {slpimpaired:27210} Level of impairment: {SLP level of impairment:25441} Respiration: {respbreathing:27195} Phonation: {SLP phonation:25439} Resonance: {SLP resonance:25440} Articulation: {SLParticulation:27218} Intelligibility: {SLP Intelligible:25442} Motor planning: {slpmotorspeecherrors:27220} Motor speech errors: {SLP motor speech errors:25443} Interfering components: {SLP  Interfering components (MS):25444} Effective technique: {SLP effective technique (MS):25445}   STANDARDIZED ASSESSMENTS: {SLPCOGLINGassessments:27787}  PATIENT REPORTED OUTCOME MEASURES (PROM): {SLPPROM:27785}   TODAY'S TREATMENT:  ***  PATIENT EDUCATION: Education details: *** Person educated: {Person educated:25204} Education method: {Education Method:25205} Education comprehension: {Education Comprehension:25206}   HOME EXERCISE PROGRAM:   ***   GOALS:  Goals reviewed with patient? {yes/no:20286}  SHORT TERM GOALS: Target date: 10 sessions  *** Baseline: Goal status: {GOALSTATUS:25110}  2.  *** Baseline:  Goal status: {GOALSTATUS:25110}  3.  *** Baseline:  Goal status: {GOALSTATUS:25110}  4.  *** Baseline:  Goal status: {GOALSTATUS:25110}  5.  *** Baseline:  Goal status: {GOALSTATUS:25110}  6.  *** Baseline:  Goal status: {GOALSTATUS:25110}  LONG TERM GOALS: Target date:   *** Baseline:  Goal status: {GOALSTATUS:25110}  2.  *** Baseline:  Goal status: {GOALSTATUS:25110}  3.  *** Baseline:  Goal status: {GOALSTATUS:25110}  4.  *** Baseline:  Goal status: {GOALSTATUS:25110}  5.  *** Baseline:  Goal status: {GOALSTATUS:25110}  6.  *** Baseline:  Goal status: {GOALSTATUS:25110}  ASSESSMENT:  CLINICAL IMPRESSION: Patient is a *** y.o. *** who was seen today for ***.   OBJECTIVE IMPAIRMENTS include {SLPOBJIMP:27107}. These impairments are limiting patient from {SLPLIMIT:27108}. Factors affecting potential to achieve goals and functional outcome are {SLP factors:25450}. Patient will benefit from skilled SLP services to address above impairments and improve overall function.  REHAB POTENTIAL: {rehabpotential:25112}  PLAN: SLP FREQUENCY: {rehab frequency:25116}  SLP DURATION: {rehab duration:25117}  PLANNED INTERVENTIONS: {SLP treatment/interventions:25449}   Noelia Lenart B. Rubbie, M.S., CCC-SLP, Research Officer, [employer] Certified Brain Injury Specialist [facility]  [facility] Office [phone] Ascom [phone] Fax [phone]

## 2024-02-03 ENCOUNTER — Other Ambulatory Visit: Payer: Self-pay | Admitting: Cardiovascular Disease

## 2024-02-05 ENCOUNTER — Inpatient Hospital Stay

## 2024-02-05 ENCOUNTER — Encounter: Payer: Self-pay | Admitting: Genetic Counselor

## 2024-02-06 ENCOUNTER — Ambulatory Visit: Attending: Neurosurgery | Admitting: Speech Pathology

## 2024-02-06 DIAGNOSIS — R41841 Cognitive communication deficit: Secondary | ICD-10-CM

## 2024-02-06 DIAGNOSIS — R4701 Aphasia: Secondary | ICD-10-CM | POA: Insufficient documentation

## 2024-02-06 NOTE — Therapy (Unsigned)
 OUTPATIENT SPEECH LANGUAGE PATHOLOGY  TREATMENT NOTE   Patient Name: Douglas Hunter MRN: [id number] DOB:04-22-1955, 68 y.o., male Today's Date: 02/07/2024  PCP: Cheryl Jericho, MD REFERRING PROVIDER: Dino Sable, MD   End of Session - 02/06/24 1307     Visit Number 2    Number of Visits 25    Date for Recertification  04/25/24    Authorization Type [employer]    Authorization Time Period 02/01/2024 thru 04/25/2024    Authorization - Visit Number 2    Authorization - Number of Visits 24    Progress Note Due on Visit 10    SLP Start Time 1315    SLP Stop Time  1400    SLP Time Calculation (min) 45 min    Activity Tolerance Patient tolerated treatment well          Past Medical History:  Diagnosis Date   AKI (acute kidney injury) 03/13/2020   Atrial flutter (HCC)    Coronary artery disease 08/2011   a. NSTEMI 08/2011, LHC w/ severe disease of LCx with good collaterals, lesion was high-risk for intervention due to a steep angulation of the LCx coming from the left main coronary artery as well as heavy calcifications, discused at cath conference w/ consensus advising med Rx; b. 03/2020 Cath: LM nl, LAD 46m, LCX 100p w/ R->L and L->L collats. RCA dominant, nl. EF 55%. PA 33/6/19, PCWP 8.   GERD (gastroesophageal reflux disease)    H/O brain tumor 01/2024   History of echocardiogram    a. 01/2020 Echo: EF 60-65%, no rwma. Sev elev PASP. Mod dil LA.   Hyperlipidemia    Hypertension    Myocardial infarction [facility])    Paroxysmal atrial fibrillation (HCC)    a. s/p ablation 01/2013; b CHADS2VASc => 3 (HTN, DM, vascular disease); c. on Coumadin ; d. 12/2019 Amio d/c'd 2/2 hyperthyroidism.   Poorly controlled diabetes mellitus (HCC)    RBBB    Sleep apnea    uses nightly   Past Surgical History:  Procedure Laterality Date   ABLATION  01/29/2013   PVI and CTI by Dr Kelsie for atrial flutter and paroxysmal atrial fibrillation   ANTERIOR CERVICAL DECOMP/DISCECTOMY FUSION  N/A 04/18/2018   Procedure: ANTERIOR CERVICAL DECOMPRESSION/DISCECTOMY FUSION 2 LEVELS C5-7;  Surgeon: Clois Fret, MD;  Location: [facility] ORS;  Service: Neurosurgery;  Laterality: N/A;   APPLICATION OF CRANIAL NAVIGATION Left 01/29/2024   Procedure: COMPUTER-ASSISTED NAVIGATION, FOR CRANIAL PROCEDURE;  Surgeon: Sable Dino HERO, MD;  Location: [facility] OR;  Service: Neurosurgery;  Laterality: Left;   ATRIAL FIBRILLATION ABLATION N/A 01/29/2013   Procedure: ATRIAL FIBRILLATION ABLATION;  Surgeon: Lynwood JONETTA Kelsie, MD;  Location: [facility] CATH LAB;  Service: Cardiovascular;  Laterality: N/A;   ATRIAL FIBRILLATION ABLATION N/A 11/15/2021   Procedure: ATRIAL FIBRILLATION ABLATION;  Surgeon: Cindie Ole DASEN, MD;  [facility] INVASIVE CV LAB;  Service: Cardiovascular;  Laterality: N/A;   CARDIAC CATHETERIZATION  08/03/2011   COLONOSCOPY WITH PROPOFOL  N/A 02/02/2016   Procedure: COLONOSCOPY WITH PROPOFOL ;  Surgeon: Rogelia Copping, MD;  Location: [facility] ENDOSCOPY;  Service: Endoscopy;  Laterality: N/A;   COLONOSCOPY WITH PROPOFOL  N/A 04/20/2023   Procedure: COLONOSCOPY WITH PROPOFOL ;  Surgeon: Copping Rogelia, MD;  Location: [facility] ENDOSCOPY;  Service: Endoscopy;  Laterality: N/A;   CRANIOTOMY Left 01/29/2024   Procedure: CRANIOTOMY TUMOR EXCISION;  Surgeon: Sable Dino HERO, MD;  Location: [facility] OR;  Service: Neurosurgery;  Laterality: Left;  LEFT TEMPORAL MASS RESCETION   LUMBAR LAMINECTOMY/DECOMPRESSION MICRODISCECTOMY N/A 06/07/2017  Procedure: LUMBAR LAMINECTOMY/DECOMPRESSION MICRODISCECTOMY 2 LEVELS-L3-4,L4-5;  Surgeon: Clois Fret, MD;  Location: [facility] ORS;  Service: Neurosurgery;  Laterality: N/A;   POLYPECTOMY  04/20/2023   Procedure: POLYPECTOMY;  Surgeon: Jinny Carmine, MD;  Location: [facility] ENDOSCOPY;  Service: Endoscopy;;   RIGHT/LEFT HEART CATH AND CORONARY ANGIOGRAPHY N/A 03/06/2020   Procedure: RIGHT/LEFT HEART CATH AND CORONARY ANGIOGRAPHY;  Surgeon: Perla Evalene PARAS, MD;  Location: [facility] INVASIVE CV LAB;   Service: Cardiovascular;  Laterality: N/A;   TEE WITHOUT CARDIOVERSION N/A 01/28/2013   Procedure: TRANSESOPHAGEAL ECHOCARDIOGRAM (TEE);  Surgeon: Redell GORMAN Shallow, MD;  Location: [facility] ENDOSCOPY;  Service: Cardiovascular;  Laterality: N/A;   Patient Active Problem List   Diagnosis Date Noted   Status post craniotomy 01/29/2024   Brain tumor (HCC) 01/29/2024   Brain mass 01/29/2024   History of colon polyps 04/20/2023   Polyp of ascending colon 04/20/2023   OSA on CPAP 06/28/2022   Secondary hypercoagulable state 04/13/2020   Rapid atrial fibrillation (HCC) 03/13/2020   Postural dizziness with presyncope 03/13/2020   AKI (acute kidney injury) 03/13/2020   Metabolic acidosis 03/13/2020   Elevated troponin 03/13/2020   Atrial fibrillation with rapid ventricular response (HCC) 03/13/2020   Unstable angina (HCC) 03/06/2020   Cervical radiculopathy 04/18/2018   Obesity, unspecified 07/19/2017   Paroxysmal atrial fibrillation (HCC)    Poorly controlled diabetes mellitus (HCC)    Special screening for malignant neoplasms, colon    Polyp of sigmoid colon    Benign neoplasm of ascending colon    Erectile dysfunction 12/10/2015   Gastroesophageal reflux disease without esophagitis 12/03/2015   Snoring 01/23/2013   Atrial flutter (HCC) 08/20/2012   Chest pain 08/19/2011   HTN (hypertension) 08/19/2011   HLD (hyperlipidemia) 08/19/2011   Type 2 diabetes mellitus with hyperglycemia (HCC) 08/19/2011   Coronary artery disease 08/03/2011    ONSET DATE: 01/24/2024   REFERRING DIAG: G93.89 (ICD-10-CM) - Brain mass   THERAPY DIAG:  Aphasia  Rationale for Evaluation and Treatment Rehabilitation  SUBJECTIVE:   PERTINENT HISTORY and DIAGNOSTIC FINDINGS: 68 y.o. male presented [facility] ED on 01/24/2024 with confusion. Found to have LEFT temporal tumor (see MRI results below). He presented to [facility] hospital on 01/29/2024 for LEFT craniotomory for resection of LEFT  temporal lobe mass. PMH includes  OSA, afib, DMII, CKD III, CAD, HTN, HLD.  MRI 01/24/2024 1. Enhancing left temporal lobe mass measuring 4.3 x 3.5 x 2.7 cm with surrounding vasogenic edema, concerning for metastatic disease versus primary CNS neoplasm. 2. Mass effect with significant effacement of the atrium and posterior body of the left lateral ventricle. 3 mm rightward midline shift.  MRI 01/30/2024 1. Postoperative changes from interval left temporal craniotomy for tumor resection. 5 mm ring-enhancing focus at the posterior margin of the resection cavity, suspicious for a small focus of residual tumor. Otherwise, no other visible residual tumor evident by MRI. 2. 1.9 cm linear focus of restricted diffusion extending from the resection cavity to the atrium of the left lateral ventricle, consistent with a small peri-resection infarct. 3. Small postoperative extra-axial collection subjacent to the craniotomy bone flap measuring up to 1 cm in diameter without significant mass effect. 4. Mild interval improvement in FLAIR signal abnormality involving the left frontotemporal region.  Biopsy results: High-grade glioma, WHO 4   PAIN:  Are you having pain? No   FALLS: Has patient fallen in last 6 months?  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 SLP facilitated session by providing the following interventions:  Pt ambulated to ST's office today instead of using a [employer] chair. He and his wife report we have been working really hard this week. Pt demonstrated  immediately improved expressive and receptive language abilities during social greetings and throughout the session. Pt able to provide information with improved listener understanding to improved word finding. Semantic paraphasia observed x 3 throughout numerous spontaneous comments. Great improvement.   Reading comprehension was assessed at the word level and sentence completion targeting object function  Pt able to pair printed word to corresponding picture of basic common object in a field of 3 with 100% accuracy. Pt's speech intelligibility when reading the word was also 100%.   Pt able to pair printed sentence completion to the picture of basic common object in a field of 3 with 100% accuracy. His speech intelligibility when reading the sentence was > 95%. Pt with improved naming (of 100%).   Pt with improved responses to orientation questions - independently     The [employer] (BNT) was administered to further quantify pt's progress.   The [employer] (BNT) is a brief language test that requires an examinee to name 60 line drawings of objects that are increasingly difficult to identify. The test is discontinued after 8 consecutive item failures.   Pt was administered 40 items before ceiling (8 consecutive incorrect items) was reached. Of these 40 items, pt with 20 spontaneously correct responses improving to 21 spontaneously correct responses + stimulus cues.   Total Items Administered: 40 Total Correct (Spontaneous + Stimulus-cued): 21 Raw Score: 21/40= 52%  Pt's score of 21 is considered to be 7 SD below mean for age and education.   Qualitatively, phonemics cues were helpful in improving 3 ot pt's 19 incorrect responses.    PATIENT REPORTED OUTCOME MEASURES (PROM): Will administer over the next 3 sessions   PATIENT EDUCATION: Education details: see above Person educated: Patient, Spouse, and Child(ren) Education method: Explanation Education comprehension:  verbalized understanding and needs further education   HOME EXERCISE PROGRAM:   Provide choices with verbal label.   Practice rote information   GOALS:  Goals reviewed with patient? Yes  SHORT TERM GOALS: Target date: 10 sessions  Pt will participate in assessment of reading and writing abilities to help potentially establish alternative methods of communication.  Baseline: Goal status: INITIAL  2.   With Moderate A, patient will point to appropriate photos on a simple AAC system with 50% when given visual model.  Baseline:  Goal status: INITIAL: MET  3.  With Maximal A, pt will point to the given picture in a choice of 3 when given object function with 50% accuracy.   Baseline:  Goal status: INITIAL: MET - pt independent  4.  With maximal assistance, pt will name 5 basic objects in 3 out of 5 opportunities.  Baseline:  Goal status: INITIAL   LONG TERM GOALS: Target date: 04/25/2024   Maximal A, pt will demonstrate consistent response to various stimuli (i.e., pictures, etc) with 50% accuracy.    Baseline:  Goal status: INITIAL  2.  With Moderate A Maximal  A, pt will respond to general questions using AAC/nonverbal communication/verbal communication effectively with 50% accuracy.  Baseline:  Goal status: INITIAL MET   ASSESSMENT:  CLINICAL IMPRESSION: Patient is a 68 y.o. male who was seen today for a speech language treatment d/t recent left craniotomy for resection of left temporal tumor.  Pt presents with much improved language abilities c/b of moderate anomic aphasia.   See the above treatment note for details.    OBJECTIVE IMPAIRMENTS include expressive language, receptive language, and aphasia. These impairments are limiting patient from return to work, managing medications, managing appointments, managing finances, household responsibilities, ADLs/IADLs, and effectively communicating at home and in community. Factors affecting potential to achieve goals and  functional outcome are medical prognosis, severity of impairments, and presumed baseline hearing loss. Patient will benefit from skilled SLP services to address above impairments and improve overall function.  REHAB POTENTIAL: Good  PLAN: SLP FREQUENCY: 1-2x/week  SLP DURATION: 12 weeks  PLANNED INTERVENTIONS: Language facilitation, Environmental controls, Cueing hierachy, Cognitive reorganization, Internal/external aids, Functional tasks, Multimodal communication approach, SLP instruction and feedback, Compensatory strategies, and Patient/family education   Hassell Roys, SLP Student Clinician  Happi B. Rubbie, M.S., CCC-SLP, Tree Surgeon Certified Brain Injury Specialist [facility]  [facility] Services Office [phone] Ascom [phone] Fax [phone]

## 2024-02-08 ENCOUNTER — Ambulatory Visit: Admitting: Speech Pathology

## 2024-02-08 ENCOUNTER — Encounter: Payer: Self-pay | Admitting: Internal Medicine

## 2024-02-08 ENCOUNTER — Inpatient Hospital Stay: Admitting: Internal Medicine

## 2024-02-08 VITALS — BP 150/68 | HR 75 | Temp 97.6°F | Resp 18 | Ht 70.0 in | Wt 243.0 lb

## 2024-02-08 DIAGNOSIS — R4701 Aphasia: Secondary | ICD-10-CM

## 2024-02-08 DIAGNOSIS — Z7952 Long term (current) use of systemic steroids: Secondary | ICD-10-CM | POA: Insufficient documentation

## 2024-02-08 DIAGNOSIS — Z79899 Other long term (current) drug therapy: Secondary | ICD-10-CM | POA: Insufficient documentation

## 2024-02-08 DIAGNOSIS — C712 Malignant neoplasm of temporal lobe: Secondary | ICD-10-CM | POA: Insufficient documentation

## 2024-02-08 DIAGNOSIS — R569 Unspecified convulsions: Secondary | ICD-10-CM | POA: Insufficient documentation

## 2024-02-08 DIAGNOSIS — C719 Malignant neoplasm of brain, unspecified: Secondary | ICD-10-CM

## 2024-02-08 NOTE — Progress Notes (Signed)
 [facility] [address]  [place], [place] [address] [phone]   New Patient Evaluation  Date of Service: 02/08/24 Patient Name: Douglas Hunter Patient MRN: [id number] Patient DOB: 08-16-1955 Provider: Arthea MARLA Manns, MD  Identifying Statement:  Douglas Hunter is a 68 y.o. male with left temporal glioblastoma who presents for initial consultation and evaluation.    Referring Provider: Rosslyn Dino HERO, MD [address] [place] [address] [place],  [place] [address]  Oncologic History: Oncology History  Glioblastoma [facility])  01/29/2024 Surgery   Left temporal craniotomy, resection with Dr. Rosslyn; path is Grade 4 glioma, IDH pending     Biomarkers:  MGMT Unknown.  IDH 1/2 Unknown.  EGFR Unknown  TERT Unknown   History of Present Illness: The patient's records from the referring physician were obtained and reviewed and the patient interviewed to confirm this HPI.  Douglas Hunter presented to neurologic attention on 10/22 with episode of speech arrest.  Family notes that he was in normal state of health prior to the deficit, and that the onset was sudden.  He was evaluated for stroke in the ED, but CNS imaging demonstrated a left temporal mass c/w primary brain tumor.  He underwent craniotomy, resection on 01/29/24 with Dr. Rosslyn; path demonstrated grade 4 glioma.  Following surgery he had some difficulty with speech, but this is greatly improved at present.  He is walking on his own without issue, independent with activities of daily living.  Dosing decadron  4mg  twice per day, Keppra 500mg  twice per day.  Medications: Current Outpatient Medications on File Prior to Visit  Medication Sig Dispense Refill   acetaminophen  (TYLENOL ) 500 MG tablet Take 500 mg by mouth 4 (four) times daily as needed for mild pain or moderate pain.     amLODipine  (NORVASC ) 10 MG tablet Take 1 tablet (10 mg total) by mouth daily. 30 tablet 3   atorvastatin   (LIPITOR ) 80 MG tablet Take 80 mg by mouth at bedtime.     benazepril  (LOTENSIN ) 40 MG tablet TAKE 1 TABLET BY MOUTH ONCE DAILY 90 tablet 3   cholecalciferol  (VITAMIN D3) 25 MCG (1000 UNIT) tablet Take 500 Units by mouth daily.     Continuous Blood Gluc Sensor (FREESTYLE LIBRE 14 DAY SENSOR) MISC SMARTSIG:1 Kit(s) Topical Every 2 Weeks     dexamethasone  (DECADRON ) 4 MG tablet Take 1 tablet (4 mg total) by mouth every 8 (eight) hours. 90 tablet 3   dofetilide  (TIKOSYN ) 500 MCG capsule TAKE 1 CAPSULE BY MOUTH TWICE DAILY 180 capsule 2   ezetimibe  (ZETIA ) 10 MG tablet TAKE 1 TABLET BY MOUTH ONCE DAILY 90 tablet 3   insulin  glargine (LANTUS ) 100 UNIT/ML injection Inject 50 Units into the skin 2 (two) times daily.     insulin  lispro (HUMALOG ) 100 UNIT/ML KiwkPen Inject 16-22 Units into the skin 2 (two) times daily. Sliding Scale     INSULIN  SYRINGE 1CC/29G 29G X 1/2 1 ML MISC USE AS DIRECTED 100 each PRN   JARDIANCE 25 MG TABS tablet Take 12.5 mg by mouth daily.     levETIRAcetam (KEPPRA) 500 MG tablet Take 1 tablet (500 mg total) by mouth 2 (two) times daily. 60 tablet 0   metoprolol  succinate (TOPROL -XL) 25 MG 24 hr tablet TAKE 1 TABLET BY MOUTH ONCE DAILY 90 tablet 1   ONE TOUCH ULTRA TEST test strip TEST THREE TIMES A DAY AS DIRECTED. 100 each 12   OVER THE COUNTER MEDICATION Take  1 capsule by mouth 2 (two) times daily. Omega XL     pantoprazole  (PROTONIX ) 40 MG tablet Take 1 tablet (40 mg total) by mouth daily. Stop taking if no longer on decadron . 30 tablet 3   rivaroxaban  (XARELTO ) 20 MG TABS tablet Take 1 tablet (20 mg total) by mouth daily with supper. 30 tablet 3   baclofen  (LIORESAL ) 10 MG tablet Take 1 tablet (10 mg total) by mouth 2 (two) times daily as needed (neck pain, muscle spasms). (Patient not taking: Reported on 02/08/2024) 60 each 0   butalbital-acetaminophen -caffeine (FIORICET) 50-325-40 MG tablet Take 2 tablets by mouth every 4 (four) hours as needed for headache (Moderate pain).  (Patient not taking: Reported on 02/08/2024) 14 tablet 0   furosemide  (LASIX ) 20 MG tablet TAKE 1 TABLET BY MOUTH TWICE DAILY AS NEEDED (Patient not taking: Reported on 02/08/2024) 180 tablet 0   nitroGLYCERIN  (NITROSTAT ) 0.4 MG SL tablet Place 1 tablet (0.4 mg total) under the tongue every 5 (five) minutes as needed for chest pain. (Patient not taking: Reported on 02/08/2024) 25 tablet 1   Current Facility-Administered Medications on File Prior to Visit  Medication Dose Route Frequency Provider Last Rate Last Admin   sodium chloride  flush (NS) 0.9 % injection 3 mL  3 mL Intravenous Q12H Visser, Jacquelyn D, PA-C        Allergies: No Known Allergies Past Medical History:  Past Medical History:  Diagnosis Date   AKI (acute kidney injury) 03/13/2020   Atrial flutter (HCC)    Coronary artery disease 08/2011   a. NSTEMI 08/2011, LHC w/ severe disease of LCx with good collaterals, lesion was high-risk for intervention due to a steep angulation of the LCx coming from the left main coronary artery as well as heavy calcifications, discused at cath conference w/ consensus advising med Rx; b. 03/2020 Cath: LM nl, LAD 76m, LCX 100p w/ R->L and L->L collats. RCA dominant, nl. EF 55%. PA 33/6/19, PCWP 8.   GERD (gastroesophageal reflux disease)    H/O brain tumor 01/2024   History of echocardiogram    a. 01/2020 Echo: EF 60-65%, no rwma. Sev elev PASP. Mod dil LA.   Hyperlipidemia    Hypertension    Myocardial infarction [facility])    Paroxysmal atrial fibrillation (HCC)    a. s/p ablation 01/2013; b CHADS2VASc => 3 (HTN, DM, vascular disease); c. on Coumadin ; d. 12/2019 Amio d/c'd 2/2 hyperthyroidism.   Poorly controlled diabetes mellitus (HCC)    RBBB    Sleep apnea    uses nightly   Past Surgical History:  Past Surgical History:  Procedure Laterality Date   ABLATION  01/29/2013   PVI and CTI by Dr Kelsie for atrial flutter and paroxysmal atrial fibrillation   ANTERIOR CERVICAL DECOMP/DISCECTOMY  FUSION N/A 04/18/2018   Procedure: ANTERIOR CERVICAL DECOMPRESSION/DISCECTOMY FUSION 2 LEVELS C5-7;  Surgeon: Clois Fret, MD;  Location: [facility] ORS;  Service: Neurosurgery;  Laterality: N/A;   APPLICATION OF CRANIAL NAVIGATION Left 01/29/2024   Procedure: COMPUTER-ASSISTED NAVIGATION, FOR CRANIAL PROCEDURE;  Surgeon: Rosslyn Dino HERO, MD;  Location: [facility] OR;  Service: Neurosurgery;  Laterality: Left;   ATRIAL FIBRILLATION ABLATION N/A 01/29/2013   Procedure: ATRIAL FIBRILLATION ABLATION;  Surgeon: Lynwood JONETTA Kelsie, MD;  Location: [facility] CATH LAB;  Service: Cardiovascular;  Laterality: N/A;   ATRIAL FIBRILLATION ABLATION N/A 11/15/2021   Procedure: ATRIAL FIBRILLATION ABLATION;  Surgeon: Cindie Ole DASEN, MD;  Location: [facility] INVASIVE CV LAB;  Service: Cardiovascular;  Laterality: N/A;   CARDIAC CATHETERIZATION  08/03/2011   COLONOSCOPY WITH PROPOFOL  N/A 02/02/2016   Procedure: COLONOSCOPY WITH PROPOFOL ;  Surgeon: Rogelia Copping, MD;  Location: [facility] ENDOSCOPY;  Service: Endoscopy;  Laterality: N/A;   COLONOSCOPY WITH PROPOFOL  N/A 04/20/2023   Procedure: COLONOSCOPY WITH PROPOFOL ;  Surgeon: Copping Rogelia, MD;  Location: [facility] ENDOSCOPY;  Service: Endoscopy;  Laterality: N/A;   CRANIOTOMY Left 01/29/2024   Procedure: CRANIOTOMY TUMOR EXCISION;  Surgeon: Rosslyn Dino HERO, MD;  Location: [facility] OR;  Service: Neurosurgery;  Laterality: Left;  LEFT TEMPORAL MASS RESCETION   LUMBAR LAMINECTOMY/DECOMPRESSION MICRODISCECTOMY N/A 06/07/2017   Procedure: LUMBAR LAMINECTOMY/DECOMPRESSION MICRODISCECTOMY 2 LEVELS-L3-4,L4-5;  Surgeon: Clois Fret, MD;  Location: [facility] ORS;  Service: Neurosurgery;  Laterality: N/A;   POLYPECTOMY  04/20/2023   Procedure: POLYPECTOMY;  Surgeon: Copping Rogelia, MD;  Location: [facility] ENDOSCOPY;  Service: Endoscopy;;   RIGHT/LEFT HEART CATH AND CORONARY ANGIOGRAPHY N/A 03/06/2020   Procedure: RIGHT/LEFT HEART CATH AND CORONARY ANGIOGRAPHY;  Surgeon: Perla Evalene PARAS, MD;  Location: [facility] INVASIVE CV  LAB;  Service: Cardiovascular;  Laterality: N/A;   TEE WITHOUT CARDIOVERSION N/A 01/28/2013   Procedure: TRANSESOPHAGEAL ECHOCARDIOGRAM (TEE);  Surgeon: Redell GORMAN Shallow, MD;  Location: [facility] ENDOSCOPY;  Service: Cardiovascular;  Laterality: N/A;   Social History:  Social History   Socioeconomic History   Marital status: Married    Spouse name: Not on file   Number of children: 2   Years of education: Not on file   Highest education level: Not on file  Occupational History   Not on file  Tobacco Use   Smoking status: Former    Current packs/day: 0.00    Average packs/day: 1.5 packs/day for 35.0 years (52.5 ttl pk-yrs)    Types: Cigarettes    Start date: 01/15/1973    Quit date: 01/16/2008    Years since quitting: 16.0   Smokeless tobacco: Never   Tobacco comments:    Former smoker 12/09/21  Vaping Use   Vaping status: Never Used  Substance and Sexual Activity   Alcohol use: Not Currently    Comment: occasional   Drug use: No   Sexual activity: Not Currently  Other Topics Concern   Not on file  Social History Narrative   Pt lives in [place] (right outside of [place])   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 [facility]   Overall Financial Resource Strain (CARDIA)    Difficulty of Paying Living Expenses: Not hard at all  Food Insecurity: No Food Insecurity (02/08/2024)   Hunger Vital Sign    Worried About Running Out of Food in the Last Year: Never true    Ran Out of Food in the Last Year: Never true  Transportation Needs: No Transportation Needs (02/08/2024)   PRAPARE - Administrator, Civil Service (Medical): No    Lack of Transportation (Non-Medical): No  Physical Activity: Not on file  Stress: Not on file  Social Connections: Not on file  Intimate Partner Violence: Not At Risk (02/08/2024)   Humiliation, Afraid,  Rape, and Kick questionnaire    Fear of Current or Ex-Partner: No    Emotionally Abused: No    Physically Abused: No    Sexually Abused: No   Family History:  Family History  Problem Relation Age of Onset   Stroke Mother    Heart disease Mother  pacemaker   Heart attack Father 6   Throat cancer Father        throat   Heart disease Paternal Grandfather        MI   Diabetes Son    Heart attack Maternal Uncle     Review of Systems: Constitutional: Doesn't report fevers, chills or abnormal weight loss Eyes: Doesn't report blurriness of vision Ears, nose, mouth, throat, and face: Doesn't report sore throat Respiratory: Doesn't report cough, dyspnea or wheezes Cardiovascular: Doesn't report palpitation, chest discomfort  Gastrointestinal:  Doesn't report nausea, constipation, diarrhea GU: Doesn't report incontinence Skin: Doesn't report skin rashes Neurological: Per HPI Musculoskeletal: Doesn't report joint pain Behavioral/Psych: Doesn't report anxiety  Physical Exam: Vitals:   02/08/24 1513  BP: (!) 150/68  Pulse: 75  Resp: 18  Temp: 97.6 F (36.4 C)  SpO2: 100%   KPS: 80. General: Alert, cooperative, pleasant, in no acute distress Head: Normal EENT: No conjunctival injection or scleral icterus.  Lungs: Resp effort normal Cardiac: Regular rate Abdomen: Non-distended abdomen Skin: No rashes cyanosis or petechiae. Extremities: No clubbing or edema  Neurologic Exam: Mental Status: Awake, alert, attentive to examiner. Oriented to self and environment. Language is fluent with intact comprehension.  Cranial Nerves: Visual acuity is grossly normal. Visual fields are full. Extra-ocular movements intact. No ptosis. Face is symmetric Motor: Tone and bulk are normal. Power is full in both arms and legs. Reflexes are symmetric, no pathologic reflexes present.  Sensory: Intact to light touch Gait: Normal.   Labs: I have reviewed the data as listed    Component  Value Date/Time   NA 131 (L) 01/30/2024 1236   NA 140 06/28/2023 0946   NA 131 (L) 03/02/2014 1416   K 4.3 01/30/2024 1236   K 4.1 03/02/2014 1416   CL 105 01/30/2024 1236   CL 98 03/02/2014 1416   CO2 16 (L) 01/30/2024 1236   CO2 24 03/02/2014 1416   GLUCOSE 227 (H) 01/30/2024 1236   GLUCOSE 374 (H) 03/02/2014 1416   BUN 38 (H) 01/30/2024 1236   BUN 26 06/28/2023 0946   BUN 18 03/02/2014 1416   CREATININE 1.46 (H) 01/30/2024 1236   CREATININE 1.27 03/02/2014 1416   CALCIUM  8.1 (L) 01/30/2024 1236   CALCIUM  8.7 03/02/2014 1416   PROT 8.6 (H) 01/24/2024 1426   PROT 7.0 02/20/2020 0925   PROT 7.8 03/02/2014 1416   ALBUMIN  4.0 01/24/2024 1426   ALBUMIN  3.9 02/20/2020 0925   ALBUMIN  3.1 (L) 03/02/2014 1416   AST 28 01/24/2024 1426   AST 30 03/02/2014 1416   ALT 19 01/24/2024 1426   ALT 45 03/02/2014 1416   ALKPHOS 97 01/24/2024 1426   ALKPHOS 140 (H) 03/02/2014 1416   BILITOT 1.2 01/24/2024 1426   BILITOT 0.9 02/20/2020 0925   BILITOT 0.6 03/02/2014 1416   GFRNONAA 52 (L) 01/30/2024 1236   GFRNONAA >60 03/02/2014 1416   GFRNONAA >60 01/12/2013 0013   GFRAA 53 (L) 02/20/2020 0925   GFRAA >60 03/02/2014 1416   GFRAA >60 01/12/2013 0013   Lab Results  Component Value Date   WBC 20.0 (H) 01/31/2024   NEUTROABS 11.7 (H) 03/13/2020   HGB 14.7 01/31/2024   HCT 43.4 01/31/2024   MCV 91.2 01/31/2024   PLT 272 01/31/2024    Imaging:  MR BRAIN W WO CONTRAST Result Date: 01/30/2024 EXAM: MRI BRAIN WITH AND WITHOUT CONTRAST 01/29/2024 11:55:00 PM TECHNIQUE: Multiplanar multisequence MRI of the head/brain was performed with and without the administration  of 10 mL gadobutrol (GADAVIST) 1 MMOL/ML injection. COMPARISON: Comparison with prior MRI from 01/24/2024. CLINICAL HISTORY: Postop. Table formatting from the original note was not included.; Postop Postop. Table formatting from the original note was not included.; Postop FINDINGS: BRAIN AND VENTRICLES: Postoperative changes from  interval left temporal craniotomy for tumor resection. Small postoperative extra-axial collection subjacent to the craniotomy bone flap measures up to approximately 1 cm in maximal diameter without significant mass effect (series 5, image 12). Postoperative blood products present throughout the resection cavity within the left temporal lobe. The previously seen left temporal tumor has largely been resected. A 5 mm ring enhancing focus at the posterior margin of the resection cavity is suspicious for a small focus of residual tumor (series 10, image 23). Otherwise, no evidence of visible residual tumor is evident by MRI. Persistent surrounding FLAIR signal abnormality, consistent with vasogenic edema, is mildly improved from the prior exam. A 1.9 cm linear focus of restricted diffusion extending from the resection cavity to the atrium of the left lateral ventricle is consistent with a small peri-resection infarct (series 3, image 27). No associated hemorrhage or mass effect. No acute intracranial hemorrhage. No midline shift. No hydrocephalus. The sella is unremarkable. Normal flow voids. ORBITS: No acute abnormality. SINUSES: No acute abnormality. BONES AND SOFT TISSUES: Normal bone marrow signal and enhancement. No acute soft tissue abnormality. IMPRESSION: 1. Postoperative changes from interval left temporal craniotomy for tumor resection. 5 mm ring-enhancing focus at the posterior margin of the resection cavity, suspicious for a small focus of residual tumor. Otherwise, no other visible residual tumor evident by MRI. 2. 1.9 cm linear focus of restricted diffusion extending from the resection cavity to the atrium of the left lateral ventricle, consistent with a small peri-resection infarct. 3. Small postoperative extra-axial collection subjacent to the craniotomy bone flap measuring up to 1 cm in diameter without significant mass effect. 4. Mild interval improvement in FLAIR signal abnormality involving the left  frontotemporal region. Electronically signed by: Morene Hoard MD 01/30/2024 03:20 AM EDT RP Workstation: [id number]   MR Brain W and Wo Contrast Result Date: 01/24/2024 EXAM: MRI BRAIN WITH AND WITHOUT CONTRAST 01/24/2024 06:00:25 PM TECHNIQUE: Multiplanar multisequence MRI of the head/brain was performed with and without the administration of 10 mL gadobutrol (GADAVIST) 1 MMOL/ML injection 10 mL GADOBUTROL 1 MMOL/ML IV SOLN. COMPARISON: None available. CLINICAL HISTORY: edema and suspected mass on CT. edema and suspected mass on CT FINDINGS: BRAIN AND VENTRICLES: There is a 4.3 x 3.5 x 2.7 cm heterogeneous enhancing mass centered in the left temporal lobe corresponding to findings on same day CT. Surrounding vasogenic edema throughout the left temporal lobe extending partially into the inferior left parietal lobe as well as with extension into the posterior limb of the left internal capsule and the left external capsule. Mass effect and sulcal effacement throughout the left temporal lobe. There is significant effacement of the atrium and posterior body of the left lateral ventricle. There is up to 3 mm rightward midline shift. There are few punctate areas of susceptibility along the medial margin of the temporal lobe mass. Minimal scattered white matter signal abnormality suggestive of chronic microvascular ischemic changes. No acute infarct. No acute intracranial hemorrhage. No hydrocephalus. The sella is unremarkable. Normal flow voids. ORBITS: No acute abnormality. SINUSES: Mild mucosal thickening in the ethmoid sinuses and left maxillary sinus. BONES AND SOFT TISSUES: Normal bone marrow signal and enhancement. No acute soft tissue abnormality. IMPRESSION: 1. Enhancing left temporal lobe mass measuring  4.3 x 3.5 x 2.7 cm with surrounding vasogenic edema, concerning for metastatic disease versus primary CNS neoplasm. 2. Mass effect with significant effacement of the atrium and posterior body of the  left lateral ventricle. 3 mm rightward midline shift. Electronically signed by: Donnice Mania MD 01/24/2024 06:20 PM EDT RP Workstation: [id number]   DG Chest 2 View Result Date: 01/24/2024 CLINICAL DATA:  Cough, altered level of consciousness EXAM: CHEST - 2 VIEW COMPARISON:  03/13/2020 FINDINGS: Frontal and lateral views of the chest demonstrate an unremarkable cardiac silhouette. No acute airspace disease, effusion, or pneumothorax. No acute bony abnormalities. IMPRESSION: 1. No acute intrathoracic process. Electronically Signed   By: Ozell Daring M.D.   On: 01/24/2024 15:16   CT HEAD WO CONTRAST Result Date: 01/24/2024 CLINICAL DATA:  Altered level of consciousness EXAM: CT HEAD WITHOUT CONTRAST TECHNIQUE: Contiguous axial images were obtained from the base of the skull through the vertex without intravenous contrast. RADIATION DOSE REDUCTION: This exam was performed according to the departmental dose-optimization program which includes automated exposure control, adjustment of the mA and/or kV according to patient size and/or use of iterative reconstruction technique. COMPARISON:  06/28/2009 FINDINGS: Brain: There is extensive vasogenic edema throughout the left temporal lobe, with overlying sulcal effacement and mass effect upon the left lateral ventricle. 3 mm of rightward midline shift noted at the level of the septum pellucidum. There is a 2.4 cm hypodense masslike area within the left temporal lobe image 13/2, which could reflect cystic brain neoplasm. Further evaluation with MRI is recommended. No evidence of acute infarct or hemorrhage. The lateral ventricles and midline structures are otherwise unremarkable. There are no acute extra-axial fluid collections. Vascular: No hyperdense vessel or unexpected calcification. Skull: Normal. Negative for fracture or focal lesion. Sinuses/Orbits: No acute finding. Other: None. IMPRESSION: 1. Large region of vasogenic edema throughout the left temporal  lobe, with mild mass effect and 3 mm of right word midline shift as above. Suspected 2.4 cm cystic mass in the left temporal lobe, for which MRI brain with and without IV contrast is recommended. 2. No evidence of acute infarct or hemorrhage. Critical Value/emergent results were called by telephone at the time of interpretation on 01/24/2024 at 3:14 pm to provider [facility] , who verbally acknowledged these results. Electronically Signed   By: Ozell Daring M.D.   On: 01/24/2024 15:16    Pathology: SURGICAL PATHOLOGY CASE: [id number] PATIENT: Douglas Hunter Surgical Pathology Report  Clinical History: brain tumor (cm)  FINAL MICROSCOPIC DIAGNOSIS:  A. BRAIN TUMOR, EXCISION: - High-grade glioma, WHO 4.  B. BRAIN TUMOR, EXCISION: - High-grade glioma, WHO 4.  Comment: Block B1 will be sent to [facility] for IDH PCR testing for further classification and the results will be reported in an addendum. Dr. Janjua was notified on 01/31/2024. This case underwent intradepartmental consultation and Dr. Belvie concurs with the interpretation.  INTRAOPERATIVE DIAGNOSIS:  A.  Brain Tumor, Excision: malignant. Intraoperative diagnosis rendered by Dr. Belvie at 08:57 on 29 January 2024.  GROSS DESCRIPTION:  A. Received fresh for intraoperative consultation labeled with the patient's name and Brain tumor is a 0.7 x 0.7 x 0.2 cm aggregate of red-tan, gelatinous tissue that is submitted in toto on a single chuck for frozen section diagnosis and subsequently in cassette A1 for permanent.  B. Received fresh and subsequently placed in formalin labeled with the patient's name and Brain tumor is a 1.3 x 1.2 x 0.2 cm aggregate of red-tan, friable tissue fragments. Submitted in toto in  cassette B1.  (LEF 01/30/2024)  Final Diagnosis performed by Rexene Daily, MD.   Electronically signed 01/31/2024    Assessment/Plan Glioblastoma [facility])  Focal seizures (HCC)  We appreciate  the opportunity to participate in the care of Douglas Hunter.   We had an extensive conversation with him and his family regarding pathology, prognosis, and available treatment pathways.  We are encouraged by his good quality resection, good functional status.  We ultimately recommended proceeding with course of intensity modulated radiation therapy and concurrent daily Temozolomide.  Radiation will be administered Mon-Fri over 6 weeks, Temodar will be dosed at 75mg /m2 to be given daily over 42 days.  We reviewed side effects of temodar, including fatigue, nausea/vomiting, constipation, and cytopenias.  Informed consent was verbally obtained at bedside to proceed with oral chemotherapy.  Chemotherapy should be held for the following:  ANC less than 1,000  Platelets less than 100,000  LFT or creatinine greater than 2x ULN  If clinical concerns/contraindications develop  Every 2 weeks during radiation, labs will be checked accompanied by a clinical evaluation in the brain tumor clinic.  Decadron  should be decreased to 4mg  daily x5 days, then 2mg  daily x5 days, then stopped if tolerated.  Will otherwise continue Keppra 500mg  BID as AED.  He will meet with his endocrinologist soon to help manage insulin  dependent diabetes, fluctuating insulin  doses with corticosteroids.  We also discussed and patient consented for additional tumor profiling and sequencing through [facility].  Advanced tumor profiling could help identify actionable mutation for targeted therapy and lead to direct clinical benefit.    Referral will also be placed to social work team to help address any barriers of care at home.  Screening for potential clinical trials was performed and discussed using eligibility criteria for active protocols at [facility], loco-regional tertiary centers, as well as national database available on [email or web address].    The patient is not a candidate for a research protocol at this time due to  no suitable study identified.   We spent twenty additional minutes teaching regarding the natural history, biology, and historical experience in the treatment of brain tumors. We then discussed in detail the current recommendations for therapy focusing on the mode of administration, mechanism of action, anticipated toxicities, and quality of life issues associated with this plan. We also provided teaching sheets for the patient to take home as an additional resource.  All questions were answered. The patient knows to call the clinic with any problems, questions or concerns. No barriers to learning were detected.  I personally spent a total of 60 minutes in the care of the patient today, including counseling and review of test results.       Denzell Colasanti K Nikky Duba, MD Medical Director of Neuro-Oncology [facility] 02/08/24 4:25 PM

## 2024-02-08 NOTE — Therapy (Signed)
 OUTPATIENT SPEECH LANGUAGE PATHOLOGY  TREATMENT NOTE   Patient Name: Douglas Hunter MRN: [id number] DOB:[birth year], 68 y.o., male Today's Date: 02/08/2024  PCP: Cheryl Jericho, MD REFERRING PROVIDER: Dino Sable, MD   End of Session - 02/08/24 1330     Visit Number 3    Number of Visits 25    Date for Recertification  04/25/24    Authorization Type [employer]    Authorization Time Period 02/01/2024 thru 04/25/2024    Authorization - Visit Number 3    Authorization - Number of Visits 24    Progress Note Due on Visit 10    SLP Start Time 1100    SLP Stop Time  1145    SLP Time Calculation (min) 45 min    Activity Tolerance Patient tolerated treatment well          Past Medical History:  Diagnosis Date   AKI (acute kidney injury) 03/13/2020   Atrial flutter (HCC)    Coronary artery disease 08/2011   a. NSTEMI 08/2011, LHC w/ severe disease of LCx with good collaterals, lesion was high-risk for intervention due to a steep angulation of the LCx coming from the left main coronary artery as well as heavy calcifications, discused at cath conference w/ consensus advising med Rx; b. 03/2020 Cath: LM nl, LAD 17m, LCX 100p w/ R->L and L->L collats. RCA dominant, nl. EF 55%. PA 33/6/19, PCWP 8.   GERD (gastroesophageal reflux disease)    H/O brain tumor 01/2024   History of echocardiogram    a. 01/2020 Echo: EF 60-65%, no rwma. Sev elev PASP. Mod dil LA.   Hyperlipidemia    Hypertension    Myocardial infarction [facility])    Paroxysmal atrial fibrillation (HCC)    a. s/p ablation 01/2013; b CHADS2VASc => 3 (HTN, DM, vascular disease); c. on Coumadin ; d. 12/2019 Amio d/c'd 2/2 hyperthyroidism.   Poorly controlled diabetes mellitus (HCC)    RBBB    Sleep apnea    uses nightly   Past Surgical History:  Procedure Laterality Date   ABLATION  01/29/2013   PVI and CTI by Dr Kelsie for atrial flutter and paroxysmal atrial fibrillation   ANTERIOR CERVICAL DECOMP/DISCECTOMY FUSION  N/A 04/18/2018   Procedure: ANTERIOR CERVICAL DECOMPRESSION/DISCECTOMY FUSION 2 LEVELS C5-7;  Surgeon: Clois Fret, MD;  Location: [facility] ORS;  Service: Neurosurgery;  Laterality: N/A;   APPLICATION OF CRANIAL NAVIGATION Left 01/29/2024   Procedure: COMPUTER-ASSISTED NAVIGATION, FOR CRANIAL PROCEDURE;  Surgeon: Sable Dino HERO, MD;  Location: [facility] OR;  Service: Neurosurgery;  Laterality: Left;   ATRIAL FIBRILLATION ABLATION N/A 01/29/2013   Procedure: ATRIAL FIBRILLATION ABLATION;  Surgeon: Lynwood JONETTA Kelsie, MD;  Location: [facility] CATH LAB;  Service: Cardiovascular;  Laterality: N/A;   ATRIAL FIBRILLATION ABLATION N/A 11/15/2021   Procedure: ATRIAL FIBRILLATION ABLATION;  Surgeon: Cindie Ole DASEN, MD;  Location: [facility] INVASIVE CV LAB;  Service: Cardiovascular;  Laterality: N/A;   CARDIAC CATHETERIZATION  08/03/2011   COLONOSCOPY WITH PROPOFOL  N/A 02/02/2016   Procedure: COLONOSCOPY WITH PROPOFOL ;  Surgeon: Rogelia Copping, MD;  Location: [facility] ENDOSCOPY;  Service: Endoscopy;  Laterality: N/A;   COLONOSCOPY WITH PROPOFOL  N/A 04/20/2023   Procedure: COLONOSCOPY WITH PROPOFOL ;  Surgeon: Copping Rogelia, MD;  Location: [facility] ENDOSCOPY;  Service: Endoscopy;  Laterality: N/A;   CRANIOTOMY Left 01/29/2024   Procedure: CRANIOTOMY TUMOR EXCISION;  Surgeon: Sable Dino HERO, MD;  Location: [facility] OR;  Service: Neurosurgery;  Laterality: Left;  LEFT TEMPORAL MASS RESCETION   LUMBAR LAMINECTOMY/DECOMPRESSION MICRODISCECTOMY N/A 06/07/2017  Procedure: LUMBAR LAMINECTOMY/DECOMPRESSION MICRODISCECTOMY 2 LEVELS-L3-4,L4-5;  Surgeon: Clois Fret, MD;  Location: [facility] ORS;  Service: Neurosurgery;  Laterality: N/A;   POLYPECTOMY  04/20/2023   Procedure: POLYPECTOMY;  Surgeon: Jinny Carmine, MD;  Location: [facility] ENDOSCOPY;  Service: Endoscopy;;   RIGHT/LEFT HEART CATH AND CORONARY ANGIOGRAPHY N/A 03/06/2020   Procedure: RIGHT/LEFT HEART CATH AND CORONARY ANGIOGRAPHY;  Surgeon: Perla Evalene PARAS, MD;  Location: [facility] INVASIVE CV LAB;   Service: Cardiovascular;  Laterality: N/A;   TEE WITHOUT CARDIOVERSION N/A 01/28/2013   Procedure: TRANSESOPHAGEAL ECHOCARDIOGRAM (TEE);  Surgeon: Redell GORMAN Shallow, MD;  Location: [facility] ENDOSCOPY;  Service: Cardiovascular;  Laterality: N/A;   Patient Active Problem List   Diagnosis Date Noted   Status post craniotomy 01/29/2024   Brain tumor (HCC) 01/29/2024   Brain mass 01/29/2024   History of colon polyps 04/20/2023   Polyp of ascending colon 04/20/2023   OSA on CPAP 06/28/2022   Secondary hypercoagulable state 04/13/2020   Rapid atrial fibrillation (HCC) 03/13/2020   Postural dizziness with presyncope 03/13/2020   AKI (acute kidney injury) 03/13/2020   Metabolic acidosis 03/13/2020   Elevated troponin 03/13/2020   Atrial fibrillation with rapid ventricular response (HCC) 03/13/2020   Unstable angina (HCC) 03/06/2020   Cervical radiculopathy 04/18/2018   Obesity, unspecified 07/19/2017   Paroxysmal atrial fibrillation (HCC)    Poorly controlled diabetes mellitus (HCC)    Special screening for malignant neoplasms, colon    Polyp of sigmoid colon    Benign neoplasm of ascending colon    Erectile dysfunction 12/10/2015   Gastroesophageal reflux disease without esophagitis 12/03/2015   Snoring 01/23/2013   Atrial flutter (HCC) 08/20/2012   Chest pain 08/19/2011   HTN (hypertension) 08/19/2011   HLD (hyperlipidemia) 08/19/2011   Type 2 diabetes mellitus with hyperglycemia (HCC) 08/19/2011   Coronary artery disease 08/03/2011    ONSET DATE: 01/24/2024   REFERRING DIAG: G93.89 (ICD-10-CM) - Brain mass   THERAPY DIAG:  Aphasia  Cognitive communication deficit  Rationale for Evaluation and Treatment Rehabilitation  SUBJECTIVE:   PERTINENT HISTORY and DIAGNOSTIC FINDINGS: 68 y.o. male presented to [facility] ED on 01/24/2024 with confusion. Found to have LEFT temporal tumor (see MRI results below). He presented to [facility] hospital on 01/29/2024 for LEFT craniotomory for resection of LEFT   temporal lobe mass. PMH includes OSA, afib, DMII, CKD III, CAD, HTN, HLD.  MRI 01/24/2024 1. Enhancing left temporal lobe mass measuring 4.3 x 3.5 x 2.7 cm with surrounding vasogenic edema, concerning for metastatic disease versus primary CNS neoplasm. 2. Mass effect with significant effacement of the atrium and posterior body of the left lateral ventricle. 3 mm rightward midline shift.  MRI 01/30/2024 1. Postoperative changes from interval left temporal craniotomy for tumor resection. 5 mm ring-enhancing focus at the posterior margin of the resection cavity, suspicious for a small focus of residual tumor. Otherwise, no other visible residual tumor evident by MRI. 2. 1.9 cm linear focus of restricted diffusion extending from the resection cavity to the atrium of the left lateral ventricle, consistent with a small peri-resection infarct. 3. Small postoperative extra-axial collection subjacent to the craniotomy bone flap measuring up to 1 cm in diameter without significant mass effect. 4. Mild interval improvement in FLAIR signal abnormality involving the left frontotemporal region.  Biopsy results: High-grade glioma, WHO 4   PAIN:  Are you having pain? No   FALLS: Has patient fallen in last 6 months?  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 SLP facilitated session by providing the following interventions:  Pt used a Staxxi chair today, he and his wife report L foot pain. Pt with continued increased expressive and receptive language abilities  during social greetings and throughout the session. Pt verbally expressed completing HEP with his grandson and engaging in activities with his grandchildren.   Just for Adults: Concrete Categories  Selecting Category Name from 3 choices pp 7-8: Once conditioned to task, pt able to independently match the word to the corresponding category (from a field of 3) in 25 out of 29 opportunities. Improved to 29 out of 29 opportunities when given mod to max semantic, verbal, and visual cues.  Selecting Category Members pp 19: With intermittent A, pt achieved >95% accuracy to identify the words that belonged to each category.   Matching Across Columns pp 11: Pt able to match the word with its category in 12 out 14 opportunities. Improved to 14 out of 14 with mod A (verbal, visual, semantic cues).     Pt with great problem solving skills throughout each task - much improved.   PATIENT REPORTED OUTCOME MEASURES (PROM): Will administer over the next 3 sessions   PATIENT EDUCATION: Education details: see above Person educated: Patient, Spouse, and Child(ren) Education method: Explanation Education comprehension: verbalized understanding and needs further education   HOME EXERCISE PROGRAM:   Provide choices with verbal label.   Practice rote information   GOALS:  Goals reviewed with patient? Yes  SHORT TERM GOALS: Target date: 10 sessions  Pt will participate in assessment of reading and writing abilities to help potentially establish alternative methods of communication.  Baseline: Goal status: INITIAL  2.   With Moderate A, patient will point to appropriate photos on a simple AAC system with 50% when given visual model.  Baseline:  Goal status: INITIAL: MET  3.  With Maximal A, pt will point to the given picture in a choice of 3 when given object function with 50% accuracy.   Baseline:  Goal status: INITIAL: MET - pt independent  4.  With maximal assistance, pt will name 5 basic objects  in 3 out of 5 opportunities.  Baseline:  Goal status: INITIAL   LONG TERM GOALS: Target date: 04/25/2024   Maximal A, pt will demonstrate consistent response to various stimuli (i.e., pictures, etc) with 50% accuracy.    Baseline:  Goal status: INITIAL  2.  With Moderate A Maximal A, pt will respond to general questions using AAC/nonverbal communication/verbal communication effectively with 50% accuracy.  Baseline:  Goal status: INITIAL MET   ASSESSMENT:  CLINICAL IMPRESSION: Patient is a 68 y.o. male who was seen today for a speech language treatment d/t recent left craniotomy for resection of left temporal tumor.  Pt presents with much improved language abilities c/b of moderate aphasia.   See the above treatment note for details.    OBJECTIVE IMPAIRMENTS include expressive language, receptive language, and aphasia. These impairments are limiting patient from return to work, managing medications, managing appointments, managing finances, household responsibilities, ADLs/IADLs, and effectively communicating at home and in community. Factors affecting potential to achieve goals and functional outcome are medical  prognosis, severity of impairments, and presumed baseline hearing loss. Patient will benefit from skilled SLP services to address above impairments and improve overall function.  REHAB POTENTIAL: Good  PLAN: SLP FREQUENCY: 1-2x/week  SLP DURATION: 12 weeks  PLANNED INTERVENTIONS: Language facilitation, Environmental controls, Cueing hierachy, Cognitive reorganization, Internal/external aids, Functional tasks, Multimodal communication approach, SLP instruction and feedback, Compensatory strategies, and Patient/family education   Hassell Roys, SLP Student Clinician  Happi B. Rubbie, M.S., CCC-SLP, Tree Surgeon Certified Brain Injury Specialist [facility]  [facility] Rehabilitation Services Office [phone] Ascom  [phone] Fax [phone]

## 2024-02-09 ENCOUNTER — Telehealth: Payer: Self-pay

## 2024-02-09 ENCOUNTER — Other Ambulatory Visit: Payer: Self-pay

## 2024-02-09 ENCOUNTER — Ambulatory Visit (HOSPITAL_COMMUNITY)
Admission: RE | Admit: 2024-02-09 | Discharge: 2024-02-09 | Disposition: A | Discharge status: Home / Self Care | Source: Ambulatory Visit | Attending: Neurosurgery | Admitting: Neurosurgery

## 2024-02-09 ENCOUNTER — Encounter (HOSPITAL_COMMUNITY)
Admission: EM | Disposition: A | Discharge status: Home / Self Care | Payer: Self-pay | Source: Home / Self Care | Attending: Family Medicine

## 2024-02-09 ENCOUNTER — Emergency Department (HOSPITAL_COMMUNITY): Admitting: Certified Registered"

## 2024-02-09 ENCOUNTER — Encounter: Payer: Self-pay | Admitting: Neurosurgery

## 2024-02-09 ENCOUNTER — Inpatient Hospital Stay (HOSPITAL_COMMUNITY)
Admission: EM | Admit: 2024-02-09 | Discharge: 2024-02-13 | DRG: 253 | Disposition: A | Discharge status: Home / Self Care | Attending: Family Medicine | Admitting: Family Medicine

## 2024-02-09 ENCOUNTER — Emergency Department (HOSPITAL_COMMUNITY)

## 2024-02-09 ENCOUNTER — Ambulatory Visit (INDEPENDENT_AMBULATORY_CARE_PROVIDER_SITE_OTHER): Admitting: Neurosurgery

## 2024-02-09 ENCOUNTER — Encounter (HOSPITAL_COMMUNITY): Payer: Self-pay | Admitting: Emergency Medicine

## 2024-02-09 VITALS — BP 138/78 | HR 70 | Ht 71.0 in | Wt 243.0 lb

## 2024-02-09 DIAGNOSIS — E785 Hyperlipidemia, unspecified: Secondary | ICD-10-CM | POA: Diagnosis present

## 2024-02-09 DIAGNOSIS — Z8249 Family history of ischemic heart disease and other diseases of the circulatory system: Secondary | ICD-10-CM

## 2024-02-09 DIAGNOSIS — Z794 Long term (current) use of insulin: Secondary | ICD-10-CM

## 2024-02-09 DIAGNOSIS — Z860100 Personal history of colon polyps, unspecified: Secondary | ICD-10-CM

## 2024-02-09 DIAGNOSIS — I70222 Atherosclerosis of native arteries of extremities with rest pain, left leg: Secondary | ICD-10-CM

## 2024-02-09 DIAGNOSIS — D72829 Elevated white blood cell count, unspecified: Secondary | ICD-10-CM | POA: Diagnosis present

## 2024-02-09 DIAGNOSIS — G9389 Other specified disorders of brain: Secondary | ICD-10-CM

## 2024-02-09 DIAGNOSIS — Z6833 Body mass index (BMI) 33.0-33.9, adult: Secondary | ICD-10-CM

## 2024-02-09 DIAGNOSIS — N1831 Chronic kidney disease, stage 3a: Secondary | ICD-10-CM | POA: Diagnosis present

## 2024-02-09 DIAGNOSIS — Y838 Other surgical procedures as the cause of abnormal reaction of the patient, or of later complication, without mention of misadventure at the time of the procedure: Secondary | ICD-10-CM | POA: Diagnosis not present

## 2024-02-09 DIAGNOSIS — C712 Malignant neoplasm of temporal lobe: Secondary | ICD-10-CM

## 2024-02-09 DIAGNOSIS — I480 Paroxysmal atrial fibrillation: Secondary | ICD-10-CM | POA: Diagnosis present

## 2024-02-09 DIAGNOSIS — I998 Other disorder of circulatory system: Secondary | ICD-10-CM | POA: Diagnosis not present

## 2024-02-09 DIAGNOSIS — Z87891 Personal history of nicotine dependence: Secondary | ICD-10-CM | POA: Diagnosis not present

## 2024-02-09 DIAGNOSIS — Z9889 Other specified postprocedural states: Secondary | ICD-10-CM

## 2024-02-09 DIAGNOSIS — I70202 Unspecified atherosclerosis of native arteries of extremities, left leg: Secondary | ICD-10-CM | POA: Diagnosis present

## 2024-02-09 DIAGNOSIS — G4733 Obstructive sleep apnea (adult) (pediatric): Secondary | ICD-10-CM | POA: Diagnosis present

## 2024-02-09 DIAGNOSIS — I70209 Unspecified atherosclerosis of native arteries of extremities, unspecified extremity: Secondary | ICD-10-CM | POA: Insufficient documentation

## 2024-02-09 DIAGNOSIS — E1122 Type 2 diabetes mellitus with diabetic chronic kidney disease: Secondary | ICD-10-CM | POA: Diagnosis present

## 2024-02-09 DIAGNOSIS — I10 Essential (primary) hypertension: Secondary | ICD-10-CM | POA: Diagnosis present

## 2024-02-09 DIAGNOSIS — Z808 Family history of malignant neoplasm of other organs or systems: Secondary | ICD-10-CM

## 2024-02-09 DIAGNOSIS — T380X5A Adverse effect of glucocorticoids and synthetic analogues, initial encounter: Secondary | ICD-10-CM | POA: Diagnosis present

## 2024-02-09 DIAGNOSIS — I4891 Unspecified atrial fibrillation: Secondary | ICD-10-CM

## 2024-02-09 DIAGNOSIS — Z7984 Long term (current) use of oral hypoglycemic drugs: Secondary | ICD-10-CM

## 2024-02-09 DIAGNOSIS — E1151 Type 2 diabetes mellitus with diabetic peripheral angiopathy without gangrene: Principal | ICD-10-CM | POA: Diagnosis present

## 2024-02-09 DIAGNOSIS — I2510 Atherosclerotic heart disease of native coronary artery without angina pectoris: Secondary | ICD-10-CM | POA: Diagnosis not present

## 2024-02-09 DIAGNOSIS — I743 Embolism and thrombosis of arteries of the lower extremities: Secondary | ICD-10-CM

## 2024-02-09 DIAGNOSIS — I4892 Unspecified atrial flutter: Secondary | ICD-10-CM | POA: Diagnosis present

## 2024-02-09 DIAGNOSIS — Z79899 Other long term (current) drug therapy: Secondary | ICD-10-CM | POA: Diagnosis not present

## 2024-02-09 DIAGNOSIS — E1165 Type 2 diabetes mellitus with hyperglycemia: Secondary | ICD-10-CM | POA: Diagnosis present

## 2024-02-09 DIAGNOSIS — Z833 Family history of diabetes mellitus: Secondary | ICD-10-CM

## 2024-02-09 DIAGNOSIS — K219 Gastro-esophageal reflux disease without esophagitis: Secondary | ICD-10-CM | POA: Diagnosis present

## 2024-02-09 DIAGNOSIS — I252 Old myocardial infarction: Secondary | ICD-10-CM

## 2024-02-09 DIAGNOSIS — M79662 Pain in left lower leg: Secondary | ICD-10-CM

## 2024-02-09 DIAGNOSIS — Z48811 Encounter for surgical aftercare following surgery on the nervous system: Secondary | ICD-10-CM

## 2024-02-09 DIAGNOSIS — I97638 Postprocedural hematoma of a circulatory system organ or structure following other circulatory system procedure: Secondary | ICD-10-CM | POA: Diagnosis not present

## 2024-02-09 DIAGNOSIS — Z981 Arthrodesis status: Secondary | ICD-10-CM

## 2024-02-09 DIAGNOSIS — E872 Acidosis, unspecified: Secondary | ICD-10-CM | POA: Diagnosis present

## 2024-02-09 DIAGNOSIS — I4510 Unspecified right bundle-branch block: Secondary | ICD-10-CM | POA: Diagnosis present

## 2024-02-09 DIAGNOSIS — Z23 Encounter for immunization: Secondary | ICD-10-CM

## 2024-02-09 DIAGNOSIS — Z823 Family history of stroke: Secondary | ICD-10-CM

## 2024-02-09 DIAGNOSIS — Z7901 Long term (current) use of anticoagulants: Secondary | ICD-10-CM

## 2024-02-09 DIAGNOSIS — I129 Hypertensive chronic kidney disease with stage 1 through stage 4 chronic kidney disease, or unspecified chronic kidney disease: Secondary | ICD-10-CM | POA: Diagnosis present

## 2024-02-09 HISTORY — PX: THROMBECTOMY OF BYPASS GRAFT FEMORAL- PERONEAL ARTERY: SHX6903

## 2024-02-09 HISTORY — PX: LOWER EXTREMITY ANGIOGRAM: SHX5508

## 2024-02-09 HISTORY — PX: ENDARTERECTOMY FEMORAL: SHX5804

## 2024-02-09 HISTORY — PX: PATCH ANGIOPLASTY: SHX6230

## 2024-02-09 HISTORY — PX: FASCIECTOMY, LOWER EXTREMITY: SHX7334

## 2024-02-09 LAB — CBC WITH DIFFERENTIAL/PLATELET
Abs Immature Granulocytes: 0.16 K/uL — ABNORMAL HIGH (ref 0.00–0.07)
Basophils Absolute: 0 K/uL (ref 0.0–0.1)
Basophils Relative: 0 %
Eosinophils Absolute: 0 K/uL (ref 0.0–0.5)
Eosinophils Relative: 0 %
HCT: 44.5 % (ref 39.0–52.0)
Hemoglobin: 14.6 g/dL (ref 13.0–17.0)
Immature Granulocytes: 1 %
Lymphocytes Relative: 5 %
Lymphs Abs: 0.9 K/uL (ref 0.7–4.0)
MCH: 30.7 pg (ref 26.0–34.0)
MCHC: 32.8 g/dL (ref 30.0–36.0)
MCV: 93.5 fL (ref 80.0–100.0)
Monocytes Absolute: 0.6 K/uL (ref 0.1–1.0)
Monocytes Relative: 3 %
Neutro Abs: 16.4 K/uL — ABNORMAL HIGH (ref 1.7–7.7)
Neutrophils Relative %: 91 %
Platelets: 260 K/uL (ref 150–400)
RBC: 4.76 MIL/uL (ref 4.22–5.81)
RDW: 13 % (ref 11.5–15.5)
WBC: 18 K/uL — ABNORMAL HIGH (ref 4.0–10.5)
nRBC: 0 % (ref 0.0–0.2)

## 2024-02-09 LAB — BASIC METABOLIC PANEL WITH GFR
Anion gap: 11 (ref 5–15)
BUN: 30 mg/dL — ABNORMAL HIGH (ref 8–23)
CO2: 18 mmol/L — ABNORMAL LOW (ref 22–32)
Calcium: 7.9 mg/dL — ABNORMAL LOW (ref 8.9–10.3)
Chloride: 106 mmol/L (ref 98–111)
Creatinine, Ser: 1.28 mg/dL — ABNORMAL HIGH (ref 0.61–1.24)
GFR, Estimated: >60 mL/min (ref >60)
Glucose, Bld: 192 mg/dL — ABNORMAL HIGH (ref 70–99)
Potassium: 4.6 mmol/L (ref 3.5–5.1)
Sodium: 135 mmol/L (ref 135–145)

## 2024-02-09 LAB — GLUCOSE, CAPILLARY
Glucose-Capillary: 187 mg/dL — ABNORMAL HIGH (ref 70–99)
Glucose-Capillary: 147 mg/dL — ABNORMAL HIGH (ref 70–99)
Glucose-Capillary: 138 mg/dL — ABNORMAL HIGH (ref 70–99)

## 2024-02-09 LAB — I-STAT CG4 LACTIC ACID, ED: Lactic Acid, Venous: 1.1 mmol/L (ref 0.5–1.9)

## 2024-02-09 LAB — TYPE AND SCREEN
ABO/RH(D): O POS
Antibody Screen: NEGATIVE

## 2024-02-09 LAB — HEPARIN LEVEL (UNFRACTIONATED): Heparin Unfractionated: >1.1 [IU]/mL — ABNORMAL HIGH (ref 0.30–0.70)

## 2024-02-09 SURGERY — THROMBECTOMY OF BYPASS GRAFT FEMORAL-PERONEAL ARTERY
Anesthesia: General | Site: Leg Lower | Laterality: Left

## 2024-02-09 MED ORDER — SODIUM CHLORIDE 0.9 % IV SOLN: INTRAVENOUS | Status: AC

## 2024-02-09 MED ORDER — AMLODIPINE BESYLATE 10 MG PO TABS
10.0000 mg | ORAL_TABLET | Freq: Every day | ORAL | Status: DC
  Administered 2024-02-10 – 2024-02-13 (×4): 10 mg via ORAL
  Filled 2024-02-09 (×4): qty 1

## 2024-02-09 MED ORDER — DEXAMETHASONE 2 MG PO TABS: 2.0000 mg | ORAL_TABLET | Freq: Every day | ORAL | Status: DC

## 2024-02-09 MED ORDER — LACTATED RINGERS IV SOLN: INTRAVENOUS | Status: DC

## 2024-02-09 MED ORDER — ROCURONIUM BROMIDE 10 MG/ML (PF) SYRINGE
PREFILLED_SYRINGE | INTRAVENOUS | Status: DC | PRN
  Administered 2024-02-09: 30 mg via INTRAVENOUS
  Administered 2024-02-09: 20 mg via INTRAVENOUS
  Administered 2024-02-09: 30 mg via INTRAVENOUS
  Administered 2024-02-09: 20 mg via INTRAVENOUS
  Administered 2024-02-09: 70 mg via INTRAVENOUS

## 2024-02-09 MED ORDER — ONDANSETRON HCL 4 MG/2ML IJ SOLN
INTRAMUSCULAR | Status: DC | PRN
  Administered 2024-02-09: 4 mg via INTRAVENOUS

## 2024-02-09 MED ORDER — ACETAMINOPHEN 325 MG PO TABS
650.0000 mg | ORAL_TABLET | Freq: Four times a day (QID) | ORAL | Status: DC | PRN
  Administered 2024-02-10 – 2024-02-12 (×5): 650 mg via ORAL
  Filled 2024-02-09 (×5): qty 2

## 2024-02-09 MED ORDER — NALOXONE HCL 0.4 MG/ML IJ SOLN
0.4000 mg | INTRAMUSCULAR | Status: DC | PRN
Start: 2024-02-09 — End: 2024-02-13

## 2024-02-09 MED ORDER — DOFETILIDE 500 MCG PO CAPS
500.0000 ug | ORAL_CAPSULE | Freq: Two times a day (BID) | ORAL | Status: DC
  Administered 2024-02-10 – 2024-02-13 (×8): 500 ug via ORAL
  Filled 2024-02-09 (×9): qty 1

## 2024-02-09 MED ORDER — 0.9 % SODIUM CHLORIDE (POUR BTL) OPTIME
TOPICAL | Status: DC | PRN
  Administered 2024-02-09: 1000 mL

## 2024-02-09 MED ORDER — SODIUM CHLORIDE 0.9 % IV SOLN: INTRAVENOUS | Status: DC | PRN

## 2024-02-09 MED ORDER — INSULIN ASPART 100 UNIT/ML IJ SOLN
INTRAMUSCULAR | Status: AC
  Filled 2024-02-09: qty 4

## 2024-02-09 MED ORDER — CEFAZOLIN SODIUM-DEXTROSE 2-3 GM-%(50ML) IV SOLR
INTRAVENOUS | Status: DC | PRN
  Administered 2024-02-09: 2 g via INTRAVENOUS

## 2024-02-09 MED ORDER — INSULIN ASPART 100 UNIT/ML IJ SOLN
0.0000 [IU] | INTRAMUSCULAR | Status: DC | PRN
  Administered 2024-02-09: 4 [IU] via SUBCUTANEOUS
  Filled 2024-02-09: qty 0.14

## 2024-02-09 MED ORDER — METOPROLOL SUCCINATE ER 25 MG PO TB24
25.0000 mg | ORAL_TABLET | Freq: Every day | ORAL | Status: DC
  Administered 2024-02-10 – 2024-02-13 (×4): 25 mg via ORAL
  Filled 2024-02-09 (×4): qty 1

## 2024-02-09 MED ORDER — DEXAMETHASONE 4 MG PO TABS
4.0000 mg | ORAL_TABLET | Freq: Every day | ORAL | Status: AC
  Administered 2024-02-10 – 2024-02-13 (×4): 4 mg via ORAL
  Filled 2024-02-09 (×4): qty 1

## 2024-02-09 MED ORDER — SUGAMMADEX SODIUM 200 MG/2ML IV SOLN
INTRAVENOUS | Status: DC | PRN
  Administered 2024-02-09: 200 mg via INTRAVENOUS

## 2024-02-09 MED ORDER — HEPARIN 6000 UNIT IRRIGATION SOLUTION
Status: DC | PRN
Start: 2024-02-09 — End: 2024-02-09
  Administered 2024-02-09: 1

## 2024-02-09 MED ORDER — ORAL CARE MOUTH RINSE
15.0000 mL | Freq: Once | OROMUCOSAL | Status: AC
Start: 2024-02-09 — End: 2024-02-09

## 2024-02-09 MED ORDER — INSULIN ASPART 100 UNIT/ML IJ SOLN
0.0000 [IU] | Freq: Every day | INTRAMUSCULAR | Status: DC
  Administered 2024-02-10 – 2024-02-12 (×3): 4 [IU] via SUBCUTANEOUS
  Filled 2024-02-09 (×3): qty 4

## 2024-02-09 MED ORDER — MIDAZOLAM HCL (PF) 2 MG/2ML IJ SOLN
INTRAMUSCULAR | Status: DC | PRN
  Administered 2024-02-09: 2 mg via INTRAVENOUS

## 2024-02-09 MED ORDER — ACETAMINOPHEN 10 MG/ML IV SOLN: 1000.0000 mg | Freq: Once | INTRAVENOUS | Status: DC | PRN

## 2024-02-09 MED ORDER — CHLORHEXIDINE GLUCONATE 0.12 % MT SOLN: 15.0000 mL | Freq: Once | OROMUCOSAL | Status: AC

## 2024-02-09 MED ORDER — PROPOFOL 10 MG/ML IV BOLUS
INTRAVENOUS | Status: DC | PRN
  Administered 2024-02-09: 40 mg via INTRAVENOUS
  Administered 2024-02-09: 80 mg via INTRAVENOUS

## 2024-02-09 MED ORDER — FENTANYL CITRATE (PF) 100 MCG/2ML IJ SOLN
25.0000 ug | INTRAMUSCULAR | Status: DC | PRN
  Administered 2024-02-09: 50 ug via INTRAVENOUS

## 2024-02-09 MED ORDER — HYDROMORPHONE HCL 1 MG/ML IJ SOLN: 1.0000 mg | INTRAMUSCULAR | Status: DC | PRN

## 2024-02-09 MED ORDER — CHLORHEXIDINE GLUCONATE 0.12 % MT SOLN
OROMUCOSAL | Status: AC
  Administered 2024-02-09: 15 mL via OROMUCOSAL
  Filled 2024-02-09: qty 15

## 2024-02-09 MED ORDER — ROCURONIUM BROMIDE 10 MG/ML (PF) SYRINGE
PREFILLED_SYRINGE | INTRAVENOUS | Status: AC
Start: 2024-02-09 — End: 2024-02-09
  Filled 2024-02-09: qty 10

## 2024-02-09 MED ORDER — INSULIN GLARGINE-YFGN 100 UNIT/ML ~~LOC~~ SOLN
50.0000 [IU] | Freq: Two times a day (BID) | SUBCUTANEOUS | Status: DC
  Filled 2024-02-09 (×2): qty 0.5

## 2024-02-09 MED ORDER — BENAZEPRIL HCL 20 MG PO TABS
40.0000 mg | ORAL_TABLET | Freq: Every day | ORAL | Status: DC
  Administered 2024-02-10 – 2024-02-13 (×4): 40 mg via ORAL
  Filled 2024-02-09: qty 8
  Filled 2024-02-09 (×3): qty 2
  Filled 2024-02-09: qty 8
  Filled 2024-02-09: qty 2

## 2024-02-09 MED ORDER — HEPARIN SODIUM (PORCINE) 1000 UNIT/ML IJ SOLN
INTRAMUSCULAR | Status: AC
  Filled 2024-02-09: qty 10

## 2024-02-09 MED ORDER — EZETIMIBE 10 MG PO TABS
10.0000 mg | ORAL_TABLET | Freq: Every day | ORAL | Status: DC
  Administered 2024-02-10 – 2024-02-13 (×4): 10 mg via ORAL
  Filled 2024-02-09 (×4): qty 1

## 2024-02-09 MED ORDER — CEFAZOLIN SODIUM-DEXTROSE 2-4 GM/100ML-% IV SOLN
INTRAVENOUS | Status: AC
  Filled 2024-02-09: qty 100

## 2024-02-09 MED ORDER — ACETAMINOPHEN 650 MG RE SUPP: 650.0000 mg | Freq: Four times a day (QID) | RECTAL | Status: DC | PRN

## 2024-02-09 MED ORDER — EPHEDRINE SULFATE-NACL 50-0.9 MG/10ML-% IV SOSY
PREFILLED_SYRINGE | INTRAVENOUS | Status: DC | PRN
  Administered 2024-02-09: 5 mg via INTRAVENOUS
  Administered 2024-02-09: 10 mg via INTRAVENOUS
  Administered 2024-02-09 (×2): 5 mg via INTRAVENOUS

## 2024-02-09 MED ORDER — DEXAMETHASONE 4 MG PO TABS: 4.0000 mg | ORAL_TABLET | Freq: Every day | ORAL | Status: DC

## 2024-02-09 MED ORDER — IODIXANOL 320 MG/ML IV SOLN
INTRAVENOUS | Status: DC | PRN
  Administered 2024-02-09: 35 mL via INTRA_ARTERIAL

## 2024-02-09 MED ORDER — FENTANYL CITRATE (PF) 250 MCG/5ML IJ SOLN
INTRAMUSCULAR | Status: DC | PRN
  Administered 2024-02-09 (×4): 50 ug via INTRAVENOUS

## 2024-02-09 MED ORDER — LIDOCAINE 2% (20 MG/ML) 5 ML SYRINGE
INTRAMUSCULAR | Status: AC
Start: 2024-02-09 — End: 2024-02-09
  Filled 2024-02-09: qty 5

## 2024-02-09 MED ORDER — MIDAZOLAM HCL 2 MG/2ML IJ SOLN
INTRAMUSCULAR | Status: AC
  Filled 2024-02-09: qty 2

## 2024-02-09 MED ORDER — LIDOCAINE 2% (20 MG/ML) 5 ML SYRINGE
INTRAMUSCULAR | Status: DC | PRN
Start: 2024-02-09 — End: 2024-02-09
  Administered 2024-02-09: 100 mg via INTRAVENOUS

## 2024-02-09 MED ORDER — INSULIN ASPART 100 UNIT/ML IJ SOLN
0.0000 [IU] | Freq: Three times a day (TID) | INTRAMUSCULAR | Status: DC
  Administered 2024-02-10 (×2): 5 [IU] via SUBCUTANEOUS
  Administered 2024-02-10: 8 [IU] via SUBCUTANEOUS
  Administered 2024-02-11: 3 [IU] via SUBCUTANEOUS
  Administered 2024-02-11: 5 [IU] via SUBCUTANEOUS
  Administered 2024-02-11: 11 [IU] via SUBCUTANEOUS
  Administered 2024-02-12: 3 [IU] via SUBCUTANEOUS
  Administered 2024-02-12: 5 [IU] via SUBCUTANEOUS
  Filled 2024-02-09: qty 5
  Filled 2024-02-09: qty 3
  Filled 2024-02-09: qty 5
  Filled 2024-02-09: qty 8
  Filled 2024-02-09: qty 11
  Filled 2024-02-09 (×2): qty 5

## 2024-02-09 MED ORDER — ATORVASTATIN CALCIUM 80 MG PO TABS
80.0000 mg | ORAL_TABLET | Freq: Every day | ORAL | Status: DC
  Administered 2024-02-10 – 2024-02-12 (×3): 80 mg via ORAL
  Filled 2024-02-09 (×3): qty 1

## 2024-02-09 MED ORDER — HEPARIN SODIUM (PORCINE) 1000 UNIT/ML IJ SOLN
INTRAMUSCULAR | Status: DC | PRN
  Administered 2024-02-09: 3000 [IU] via INTRAVENOUS
  Administered 2024-02-09: 10000 [IU] via INTRAVENOUS
  Administered 2024-02-09: 3000 [IU] via INTRAVENOUS

## 2024-02-09 MED ORDER — DEXAMETHASONE SOD PHOSPHATE PF 10 MG/ML IJ SOLN
INTRAMUSCULAR | Status: DC | PRN
  Administered 2024-02-09: 10 mg via INTRAVENOUS

## 2024-02-09 MED ORDER — HYDROMORPHONE HCL 1 MG/ML IJ SOLN: 0.5000 mg | INTRAMUSCULAR | Status: DC | PRN

## 2024-02-09 MED ORDER — PHENYLEPHRINE 80 MCG/ML (10ML) SYRINGE FOR IV PUSH (FOR BLOOD PRESSURE SUPPORT)
PREFILLED_SYRINGE | INTRAVENOUS | Status: DC | PRN
  Administered 2024-02-09: 160 ug via INTRAVENOUS
  Administered 2024-02-09: 40 ug via INTRAVENOUS

## 2024-02-09 MED ORDER — ONDANSETRON HCL 4 MG/2ML IJ SOLN
INTRAMUSCULAR | Status: AC
  Filled 2024-02-09: qty 2

## 2024-02-09 MED ORDER — ALBUMIN HUMAN 5 % IV SOLN: INTRAVENOUS | Status: DC | PRN

## 2024-02-09 MED ORDER — FENTANYL CITRATE (PF) 250 MCG/5ML IJ SOLN
INTRAMUSCULAR | Status: AC
  Filled 2024-02-09: qty 5

## 2024-02-09 MED ORDER — PHENYLEPHRINE HCL-NACL 20-0.9 MG/250ML-% IV SOLN: INTRAVENOUS | Status: DC | PRN

## 2024-02-09 MED ORDER — PROPOFOL 10 MG/ML IV BOLUS
INTRAVENOUS | Status: AC
  Filled 2024-02-09: qty 20

## 2024-02-09 MED ORDER — PANTOPRAZOLE SODIUM 40 MG PO TBEC
40.0000 mg | DELAYED_RELEASE_TABLET | Freq: Every day | ORAL | Status: DC
  Administered 2024-02-10 – 2024-02-13 (×4): 40 mg via ORAL
  Filled 2024-02-09 (×4): qty 1

## 2024-02-09 MED ORDER — ONDANSETRON HCL 4 MG/2ML IJ SOLN: 4.0000 mg | Freq: Once | INTRAMUSCULAR | Status: DC | PRN

## 2024-02-09 MED ORDER — PHENYLEPHRINE HCL-NACL 20-0.9 MG/250ML-% IV SOLN
INTRAVENOUS | Status: DC | PRN
  Administered 2024-02-09: 30 ug/min via INTRAVENOUS

## 2024-02-09 MED ORDER — OXYCODONE HCL 5 MG PO TABS: 5.0000 mg | ORAL_TABLET | Freq: Four times a day (QID) | ORAL | Status: DC | PRN

## 2024-02-09 MED ORDER — OXYCODONE HCL 5 MG/5ML PO SOLN: 5.0000 mg | Freq: Once | ORAL | Status: DC | PRN

## 2024-02-09 MED ORDER — HEPARIN (PORCINE) 25000 UT/250ML-% IV SOLN
1500.0000 [IU]/h | INTRAVENOUS | Status: DC
  Administered 2024-02-09: 1500 [IU]/h via INTRAVENOUS
  Filled 2024-02-09: qty 250

## 2024-02-09 MED ORDER — OXYCODONE HCL 5 MG PO TABS: 5.0000 mg | ORAL_TABLET | Freq: Once | ORAL | Status: DC | PRN

## 2024-02-09 MED ORDER — DROPERIDOL 2.5 MG/ML IJ SOLN: 0.6250 mg | Freq: Once | INTRAMUSCULAR | Status: DC | PRN

## 2024-02-09 MED ORDER — HEPARIN 6000 UNIT IRRIGATION SOLUTION
Status: AC
Start: 2024-02-09 — End: 2024-02-09
  Filled 2024-02-09: qty 500

## 2024-02-09 MED ORDER — FENTANYL CITRATE (PF) 100 MCG/2ML IJ SOLN
INTRAMUSCULAR | Status: AC
  Filled 2024-02-09: qty 2

## 2024-02-09 MED ORDER — LEVETIRACETAM 500 MG PO TABS
500.0000 mg | ORAL_TABLET | Freq: Two times a day (BID) | ORAL | Status: DC
  Administered 2024-02-10 – 2024-02-13 (×8): 500 mg via ORAL
  Filled 2024-02-09 (×8): qty 1

## 2024-02-09 SURGICAL SUPPLY — 54 items
BAG COUNTER SPONGE SURGICOUNT (BAG) ×3 IMPLANT
BENZOIN TINCTURE PRP APPL 2/3 (GAUZE/BANDAGES/DRESSINGS) ×9 IMPLANT
BNDG COMPR ESMARK 6X3 LF (GAUZE/BANDAGES/DRESSINGS) IMPLANT
BNDG ELASTIC 4INX 5YD STR LF (GAUZE/BANDAGES/DRESSINGS) IMPLANT
BNDG ELASTIC 6INX 5YD STR LF (GAUZE/BANDAGES/DRESSINGS) IMPLANT
BNDG GAUZE DERMACEA FLUFF 4 (GAUZE/BANDAGES/DRESSINGS) IMPLANT
CANISTER SUCTION 3000ML PPV (SUCTIONS) ×3 IMPLANT
CANNULA VESSEL 3MM 2 BLNT TIP (CANNULA) IMPLANT
CATH EMB 2FR 60 (CATHETERS) IMPLANT
CATH EMB 3FR 80 (CATHETERS) IMPLANT
CATH EMB 4FR 80 (CATHETERS) IMPLANT
CATH EMB 5FR 80CM (CATHETERS) IMPLANT
CHLORAPREP W/TINT 26 (MISCELLANEOUS) ×6 IMPLANT
CLSR STERI-STRIP ANTIMIC 1/2X4 (GAUZE/BANDAGES/DRESSINGS) IMPLANT
CUFF TOURN SGL QUICK 42 (TOURNIQUET CUFF) IMPLANT
CUFF TRNQT CYL 24X4X16.5-23 (TOURNIQUET CUFF) IMPLANT
CUFF TRNQT CYL 34X4.125X (TOURNIQUET CUFF) IMPLANT
DRAPE C-ARM 42X72 X-RAY (DRAPES) IMPLANT
DRAPE HALF SHEET 40X57 (DRAPES) IMPLANT
DRSG COVADERM 4X8 (GAUZE/BANDAGES/DRESSINGS) IMPLANT
ELECTRODE REM PT RTRN 9FT ADLT (ELECTROSURGICAL) ×3 IMPLANT
EVACUATOR SILICONE 100CC (DRAIN) IMPLANT
GAUZE SPONGE 4X4 12PLY STRL (GAUZE/BANDAGES/DRESSINGS) ×3 IMPLANT
GLOVE BIO SURGEON STRL SZ8 (GLOVE) ×9 IMPLANT
GOWN STRL REUS W/ TWL LRG LVL3 (GOWN DISPOSABLE) ×6 IMPLANT
GOWN STRL REUS W/ TWL XL LVL3 (GOWN DISPOSABLE) ×3 IMPLANT
INSERT FOGARTY SM (MISCELLANEOUS) IMPLANT
KIT BASIN OR (CUSTOM PROCEDURE TRAY) ×3 IMPLANT
KIT TURNOVER KIT B (KITS) ×3 IMPLANT
MARKER GRAFT CORONARY BYPASS (MISCELLANEOUS) IMPLANT
PACK PERIPHERAL VASCULAR (CUSTOM PROCEDURE TRAY) ×3 IMPLANT
PAD ARMBOARD POSITIONER FOAM (MISCELLANEOUS) ×6 IMPLANT
PATCH VASC XENOSURE 1X6 (Vascular Products) IMPLANT
SET COLLECT BLD 21X3/4 12 (NEEDLE) IMPLANT
SET MICROPUNCTURE 5F STIFF (MISCELLANEOUS) IMPLANT
SOLN 0.9% NACL POUR BTL 1000ML (IV SOLUTION) ×6 IMPLANT
SOLN STERILE WATER BTL 1000 ML (IV SOLUTION) ×3 IMPLANT
STAPLER SKIN PROX 35W (STAPLE) IMPLANT
STOPCOCK 4 WAY LG BORE MALE ST (IV SETS) IMPLANT
STRIP CLOSURE SKIN 1/2X4 (GAUZE/BANDAGES/DRESSINGS) ×9 IMPLANT
SUT ETHILON 3 0 PS 1 (SUTURE) IMPLANT
SUT MNCRL AB 4-0 PS2 18 (SUTURE) ×6 IMPLANT
SUT PROLENE 5 0 C 1 24 (SUTURE) ×3 IMPLANT
SUT PROLENE 6 0 BV (SUTURE) ×3 IMPLANT
SUT SILK 2 0 SH (SUTURE) ×3 IMPLANT
SUT SILK 3-0 18XBRD TIE 12 (SUTURE) IMPLANT
SUT VIC AB 2-0 CT1 TAPERPNT 27 (SUTURE) ×6 IMPLANT
SUT VIC AB 3-0 SH 27X BRD (SUTURE) ×6 IMPLANT
TAPE CLOTH 4X10 WHT NS (GAUZE/BANDAGES/DRESSINGS) IMPLANT
TAPE UMBILICAL 1/8X30 (MISCELLANEOUS) IMPLANT
TOWEL GREEN STERILE (TOWEL DISPOSABLE) ×3 IMPLANT
TRAY FOLEY MTR SLVR 16FR STAT (SET/KITS/TRAYS/PACK) ×3 IMPLANT
TUBING CIL FLEX 10 FLL-RA (TUBING) IMPLANT
UNDERPAD 30X36 HEAVY ABSORB (UNDERPADS AND DIAPERS) ×3 IMPLANT

## 2024-02-09 NOTE — Progress Notes (Signed)
 PHARMACY - ANTICOAGULATION CONSULT NOTE  Pharmacy Consult for heparin  Indication: Ischemic limb, arterial occulsion  No Known Allergies  Patient Measurements:    Vital Signs: Temp: 97.8 F (36.6 C) (11/07 1325) BP: 153/75 (11/07 1325) Pulse Rate: 78 (11/07 1325)  Labs: No results for input(s): HGB, HCT, PLT, APTT, LABPROT, INR, HEPARINUNFRC, HEPRLOWMOCWT, CREATININE, CKTOTAL, CKMB, TROPONINIHS in the last 72 hours.  Estimated Creatinine Clearance: 60.3 mL/min (A) (by C-G formula based on SCr of 1.46 mg/dL (H)).   Medical History: Past Medical History:  Diagnosis Date   AKI (acute kidney injury) 03/13/2020   Atrial flutter (HCC)    Coronary artery disease 08/2011   a. NSTEMI 08/2011, LHC w/ severe disease of LCx with good collaterals, lesion was high-risk for intervention due to a steep angulation of the LCx coming from the left main coronary artery as well as heavy calcifications, discused at cath conference w/ consensus advising med Rx; b. 03/2020 Cath: LM nl, LAD 15m, LCX 100p w/ R->L and L->L collats. RCA dominant, nl. EF 55%. PA 33/6/19, PCWP 8.   GERD (gastroesophageal reflux disease)    H/O brain tumor 01/2024   History of echocardiogram    a. 01/2020 Echo: EF 60-65%, no rwma. Sev elev PASP. Mod dil LA.   Hyperlipidemia    Hypertension    Myocardial infarction [facility])    Paroxysmal atrial fibrillation (HCC)    a. s/p ablation 01/2013; b CHADS2VASc => 3 (HTN, DM, vascular disease); c. on Coumadin ; d. 12/2019 Amio d/c'd 2/2 hyperthyroidism.   Poorly controlled diabetes mellitus (HCC)    RBBB    Sleep apnea    uses nightly    Assessment: 44 YOM presenting with foot pain, recent 10/27 crani with tumor resection, hx of afib on Xarelto  PTA which was held d/t the tumor resection and he was instructed to start back on it 11/6 which his last dose was taken at @1900 .  Now here with ischemic limb and arterial occlusion and pharmacy consulted to initiate  heparin .  No bolus d/t recent tumor resection and proximity to Xarelto  dose  Goal of Therapy:  Heparin  level 0.3-0.7 units/ml aPTT 66-102 seconds Monitor platelets by anticoagulation protocol: Yes   Plan:  Heparin  gtt at 1500 units/hr, no bolus F/u 6 hour heparin  level and aPTT F/u VVS plans for intervention  Dorn Poot, PharmD, [facility] Clinical Pharmacist ED Pharmacist Phone # [phone] 02/09/2024 1:58 PM

## 2024-02-09 NOTE — Op Note (Signed)
 DATE OF SERVICE: 02/09/2024  PATIENT:  Douglas Hunter  68 y.o. male  PRE-OPERATIVE DIAGNOSIS: [place] 2B acute limb ischemia of the left lower extremity  POST-OPERATIVE DIAGNOSIS:  Same  PROCEDURE:   1) left lower extremity common femoral thromboendarterectomy and bovine pericardial patch angioplasty 2) left femoral-popliteal arterial thrombectomy 3) left posterior tibial arterial thrombectomy 4) left lower extremity calf 4 compartment fasciotomy 5) left lower extremity angiogram  SURGEON:  Surgeons and Role:    * Magda Debby SAILOR, MD - Primary  ASSISTANT: Ahmed Holster, PA-C  An experienced assistant was required given the complexity of this procedure and the standard of surgical care. My assistant helped with exposure through counter tension, suctioning, ligation and retraction to better visualize the surgical field.  My assistant expedited sewing during the case by following my sutures. Wherever I use the term we in the report, my assistant actively helped me with that portion of the procedure.  ANESTHESIA:   general  EBL:  BLOOD ADMINISTERED:none  DRAINS: none   LOCAL MEDICATIONS USED:  NONE  SPECIMEN:  none  COUNTS: confirmed correct.  TOURNIQUET:  none   PATIENT DISPOSITION:  PACU - hemodynamically stable.   Delay start of Pharmacological VTE agent (>24hrs) due to surgical blood loss or risk of bleeding: no  INDICATION FOR PROCEDURE: [name] is a 68 y.o. male with left leg acute limb ischemia in setting of recent glioblastoma resection. After careful discussion of risks, benefits, and alternatives the patient was offered thrombectomy. We specifically discussed risk of limb loss even with successful revascularization. The patient understood and wished to proceed.  OPERATIVE FINDINGS: Chronic atherosclerotic change in all vessels of the left lower extremity.  Common femoral thromboendarterectomy performed with good technical result.  Thrombectomy  of the femoral-popliteal vessels retrieved significant burden of acute appearing thrombus.  No Doppler flow after initial thrombectomy.  Angiogram performed and shows inline flow to the peroneal artery to the level of the ankle.  At this point there is minimal arborization from the peroneal artery into the foot, suggesting longstanding ischemia.  Tibial thrombectomy then performed from the posterior tibial artery.  I was able to achieve inflow and some outflow.  But no real Doppler flow after patch angioplasty of the posterior tibial artery.  Fasciotomy revealed longstanding ischemia of the superficial posterior compartment and some muscles of the anterior and lateral compartment.  The deep posterior compartment appeared fairly healthy.  Overall the patient is at high risk for limb loss.  No further opportunities for limb salvage.  DESCRIPTION OF PROCEDURE: After identification of the patient in the pre-operative holding area, the patient was transferred to the operating room. The patient was positioned supine on the operating room table. Anesthesia was induced. The left leg was prepped and draped in standard fashion. A surgical pause was performed confirming correct patient, procedure, and operative location.  Oblique incision was made in the left groin over the palpable common femoral pulse.  The incision was carried down through subtenons tissue until the femoral sheath was encountered and entered carefully.  Common femoral artery was skeletonized from the external iliac artery to its bifurcation.  The artery was heavily diseased and simple arteriotomy could not be performed.  The superficial femoral artery and the profunda femoris artery were healthy beyond their origin.  The external iliac artery was healthy.  The patient was systemically heparinized.  Activated clotting time measurements Soucek case to confirm adequate anticoagulation.  Clamps were applied to the external iliac artery,  profunda femoris  artery, superficial femoral artery.  The common femoral artery was opened longitudinally and endarterectomy was performed.  Heavily calcified plaque was encountered which was nonocclusive, but did stenosed the common femoral artery.  There is no acute thrombus in the common femoral artery.  As soon as endarterectomy was performed, we performed thrombectomy of the infrainguinal vessels.  #4 Fogarty embolectomy balloon catheter was passed to the level of the ankle and thrombectomy performed.  Acute appearing thrombus returned.  Thrombectomy was repeated until no thrombus returned.  A bovine pericardial patch was sewn to the common femoral arteriotomy using continuous running suture of 5-0 Prolene.  After completion the repair was flushed and de-aired.  Clamps were released on the external iliac artery, profunda femoris artery, and superficial femoral artery.  Hemostasis was achieved in the patch with several repair sutures.  Doppler flow was used to evaluate the repair and brisk Doppler flow was heard in all branches.  The foot was evaluated with a Doppler machine.  No Doppler flow was heard.  The bovine pericardial patch was accessed with micropuncture technique.  Microsheath was used to perform left lower extremity angiogram in stations.  Angiogram revealed acute on chronic disease with inline flow to the peroneal artery to the level of the ankle and virtually no medial-most small vessel flow beyond this.  The posterior tibial artery at the ankle was exposed through a longitudinal incision behind the medial malleolus.  The posterior tibial artery was isolated and a arteriotomy made.  This artery was similarly diseased and so the artery was opened more widely.  Proximal thrombectomy was performed with a 2 Fogarty embolectomy balloon catheter.  Acute appearing thrombus returned along with good inflow.  Thrombectomy was performed into the foot, and some backbleeding was achieved.  Bovine pericardium was then sewn  to the arteriotomy using continuous running suture of 6-0 Prolene.  The wound was rendered hemostatic.  Medial calf incision was made for fasciotomy.  The incision was carried down through subcu tissue until the fascia of the superficial posterior compartments encountered.  The muscle of the superficial posterior compartment was white.  It did not vesiculated.  Today.  Dead for some time.  The tibial soleal attachments were taken down and the deep posterior compartment was entered.  This muscle appeared healthy and did fasiculate.  This incision was extended to allow the muscle to swell.  Longitudinal incision on the lateral Was made to expose the anterior and lateral compartments of the calf.  The intermuscular septum was identified.  The anterior and lateral compartment were then incised with Bovie electrocautery.  The muscle appeared mixed in both compartments.  Some healthy muscle was identified along with some dead muscle.  I felt there was little more we could do to improve the patient's chances for limb salvage.  The wounds were inspected for hemostasis.  The wounds were closed in layers using 2-0 Vicryl, 3-0 Vicryl, skin stapler.  Upon completion of the case instrument and sharps counts were confirmed correct. The patient was transferred to the PACU in good condition. I was present for all portions of the procedure.  FOLLOW UP PLAN: Patient will be admitted to the internal medicine service.  I counseled the family about our operative findings and the lack of further options for limb salvage.  Will need to take a watchful waiting approach and see if his leg is functional.  Debby SAILOR. Magda, MD FACS [facility] Phone Number: [phone]  02/09/2024 8:55 PM

## 2024-02-09 NOTE — Telephone Encounter (Signed)
 Clinical Social Work was referred by medical provider for assessment of psychosocial needs.  CSW attempted to contact patient by phone.  Left voicemail with contact information and request for return call.

## 2024-02-09 NOTE — Progress Notes (Addendum)
 Called to bedside to evaluate groin. About thirty minutes of pressure has softened the hematoma the nurse saw postoperatively. All incisions oozing. Changed bandage to calf. Will plan to DC heparin  until incisions dry.  No pedal doppler flow - expected based on intra-op findings. Patient reports his foot feels better subjectively.   Debby SAILOR. Magda, MD [facility] Phone Number: [phone] 02/09/2024 10:37 PM

## 2024-02-09 NOTE — ED Triage Notes (Signed)
 Pt arrived from home c/o left foot pain. Pt states his left foot is hurting a lot. Pt states that his foot starting hurting after he had a glioblastoma removal surgery. Upon palpation left foot is obviously cooler and paler than the right foot.

## 2024-02-09 NOTE — Transfer of Care (Signed)
 Immediate Anesthesia Transfer of Care Note  Patient: Douglas Hunter  Procedure(s) Performed: THROMBECTOMY OF LEFT FEMORAL AND POTERIOR TIBIAL  ARTERY (Left: Leg Lower) ANGIOPLASTY LEFT FEMORAL ARTERY USING XENOSURE BOVINE PERICARDIUM PATCH (Groin) LEFT FEMORAL ENDARTERECTOMY (Left: Groin) INTRAOPERATIVE LEFT LOWER EXTREMITY ANGIOGRAM (Left: Leg Lower) 4 COMPARTMENT FASCIECTOMY, LEFT LOWER EXTREMITY (Left: Leg Lower)  Patient Location: PACU  Anesthesia Type:General  Level of Consciousness: awake and alert   Airway & Oxygen Therapy: Patient Spontanous Breathing and Patient connected to face mask oxygen  Post-op Assessment: Report given to RN and Post -op Vital signs reviewed and stable  Post vital signs: Reviewed and stable  Last Vitals:  Vitals Value Taken Time  BP 129/70 02/09/24 19:19  Temp    Pulse 94 02/09/24 19:28  Resp 20 02/09/24 19:28  SpO2 94 % 02/09/24 19:28  Vitals shown include unfiled device data.  Last Pain:  Vitals:   02/09/24 1446  TempSrc: Oral  PainSc:          Complications: No notable events documented.

## 2024-02-09 NOTE — ED Triage Notes (Addendum)
 Patient here with complaints of foot pain for the past 2 days. He states it started hurting a lot and it hasn't gone away. He states that heat helps. He is not able to ambulate as well since its hurting. Denies any wounds and no injury. Reports foot is red. Denies swelling, fever or chills. Recently has brain surgery on 01/24/2024 for a glioblastoma.

## 2024-02-09 NOTE — Progress Notes (Signed)
 68 year old gentleman with a left posterior temporal mass which was resected last week.  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  Patient has already been seen by Dr. [name], neuro oncologist.  He is doing very well and his speech is almost back to normal.  Only thing keeping him from reengaging with his old life is left leg pain: Upon palpation the shin and the foot are very tender.  There is no frank swelling and his calf is also not swollen.  I would like to rule out a DVT and will order a stat ultrasound of his leg.  Of note, he restarted his Xarelto  yesterday.  I will remove the staples and he is allowed to shower tomorrow again.  I will connect with his primary care doctor regarding the ultrasound as well.

## 2024-02-09 NOTE — ED Provider Notes (Signed)
 [facility] Provider Note  CSN: [id number] Arrival date & time: 02/09/24 1317  Chief Complaint(s) Arterial Blood Clot  HPI Douglas Hunter is a 68 y.o. male with past medical history as below, significant for atrial fibrillation on Xarelto , DM, glioblastoma who presents to the ED with complaint of left leg pain  Recent resection of glioblastoma last month, he was recently restarted on his Xarelto  for atrial fibrillation last night after it was held for brain surgery.  He has been having left-sided leg pain.  He had outpatient duplex performed which was revealing for left-sided mid femoral artery occlusion.  Patient having pain to his left leg, significantly worse with movement, reports his leg feels cold.  Difficulty walking.  Past Medical History Past Medical History:  Diagnosis Date   AKI (acute kidney injury) 03/13/2020   Atrial flutter (HCC)    Coronary artery disease 08/2011   a. NSTEMI 08/2011, LHC w/ severe disease of LCx with good collaterals, lesion was high-risk for intervention due to a steep angulation of the LCx coming from the left main coronary artery as well as heavy calcifications, discused at cath conference w/ consensus advising med Rx; b. 03/2020 Cath: LM nl, LAD 69m, LCX 100p w/ R->L and L->L collats. RCA dominant, nl. EF 55%. PA 33/6/19, PCWP 8.   GERD (gastroesophageal reflux disease)    H/O brain tumor 01/2024   History of echocardiogram    a. 01/2020 Echo: EF 60-65%, no rwma. Sev elev PASP. Mod dil LA.   Hyperlipidemia    Hypertension    Myocardial infarction [facility])    Paroxysmal atrial fibrillation (HCC)    a. s/p ablation 01/2013; b CHADS2VASc => 3 (HTN, DM, vascular disease); c. on Coumadin ; d. 12/2019 Amio d/c'd 2/2 hyperthyroidism.   Poorly controlled diabetes mellitus (HCC)    RBBB    Sleep apnea    uses nightly   Patient Active Problem List   Diagnosis Date Noted   Focal seizures (HCC) 02/08/2024   Status post craniotomy 01/29/2024    Brain tumor (HCC) 01/29/2024   Glioblastoma (HCC) 01/29/2024   History of colon polyps 04/20/2023   Polyp of ascending colon 04/20/2023   OSA on CPAP 06/28/2022   Secondary hypercoagulable state 04/13/2020   Rapid atrial fibrillation (HCC) 03/13/2020   Postural dizziness with presyncope 03/13/2020   AKI (acute kidney injury) 03/13/2020   Metabolic acidosis 03/13/2020   Elevated troponin 03/13/2020   Atrial fibrillation with rapid ventricular response (HCC) 03/13/2020   Unstable angina (HCC) 03/06/2020   Cervical radiculopathy 04/18/2018   Obesity, unspecified 07/19/2017   Paroxysmal atrial fibrillation (HCC)    Poorly controlled diabetes mellitus (HCC)    Special screening for malignant neoplasms, colon    Polyp of sigmoid colon    Benign neoplasm of ascending colon    Erectile dysfunction 12/10/2015   Gastroesophageal reflux disease without esophagitis 12/03/2015   Snoring 01/23/2013   Atrial flutter (HCC) 08/20/2012   Chest pain 08/19/2011   HTN (hypertension) 08/19/2011   HLD (hyperlipidemia) 08/19/2011   Type 2 diabetes mellitus with hyperglycemia (HCC) 08/19/2011   Coronary artery disease 08/03/2011   Home Medication(s) Prior to Admission medications   Medication Sig Start Date End Date Taking? Authorizing Provider  acetaminophen  (TYLENOL ) 500 MG tablet Take 500 mg by mouth 4 (four) times daily as needed for mild pain or moderate pain.    [provider]  amLODipine  (NORVASC ) 10 MG tablet Take 1 tablet (10 mg total) by mouth daily. 02/01/24  Wilson, Tara N, PA-C  atorvastatin  (LIPITOR ) 80 MG tablet Take 80 mg by mouth at bedtime.    [provider]  baclofen  (LIORESAL ) 10 MG tablet Take 1 tablet (10 mg total) by mouth 2 (two) times daily as needed (neck pain, muscle spasms). 01/31/24 03/01/24  Wilson, Tara N, PA-C  benazepril  (LOTENSIN ) 40 MG tablet TAKE 1 TABLET BY MOUTH ONCE DAILY 02/03/23   Gollan, Timothy J, MD  butalbital-acetaminophen -caffeine  (FIORICET) 50-325-40 MG tablet Take 2 tablets by mouth every 4 (four) hours as needed for headache (Moderate pain). 01/31/24   Wilson, Tara N, PA-C  cholecalciferol  (VITAMIN D3) 25 MCG (1000 UNIT) tablet Take 500 Units by mouth daily.    [provider]  Continuous Blood Gluc Sensor (FREESTYLE LIBRE 14 DAY SENSOR) MISC SMARTSIG:1 Kit(s) Topical Every 2 Weeks 03/29/20   [provider]  dexamethasone  (DECADRON ) 4 MG tablet Take 1 tablet (4 mg total) by mouth every 8 (eight) hours. Patient taking differently: Take 4 mg by mouth daily. 01/31/24 01/30/25  Wilson, Tara N, PA-C  dofetilide  (TIKOSYN ) 500 MCG capsule TAKE 1 CAPSULE BY MOUTH TWICE DAILY 09/22/23   Cindie Ole DASEN, MD  ezetimibe  (ZETIA ) 10 MG tablet TAKE 1 TABLET BY MOUTH ONCE DAILY 03/06/23   Gollan, Timothy J, MD  furosemide  (LASIX ) 20 MG tablet TAKE 1 TABLET BY MOUTH TWICE DAILY AS NEEDED 12/29/22   Gollan, Timothy J, MD  insulin  glargine (LANTUS ) 100 UNIT/ML injection Inject 50 Units into the skin 2 (two) times daily.    [provider]  insulin  lispro (HUMALOG ) 100 UNIT/ML KiwkPen Inject 16-22 Units into the skin 2 (two) times daily. Sliding Scale 12/13/17 05/27/28  [provider]  INSULIN  SYRINGE 1CC/29G 29G X 1/2 1 ML MISC USE AS DIRECTED 05/27/15   Daphane Rosella, NP  JARDIANCE 25 MG TABS tablet Take 12.5 mg by mouth daily.    [provider]  levETIRAcetam (KEPPRA) 500 MG tablet Take 1 tablet (500 mg total) by mouth 2 (two) times daily. 01/24/24 02/23/24  Levander Slate, MD  metoprolol  succinate (TOPROL -XL) 25 MG 24 hr tablet TAKE 1 TABLET BY MOUTH ONCE DAILY 02/06/24   Gollan, Timothy J, MD  nitroGLYCERIN  (NITROSTAT ) 0.4 MG SL tablet Place 1 tablet (0.4 mg total) under the tongue every 5 (five) minutes as needed for chest pain. 07/31/23   Gollan, Timothy J, MD  ONE TOUCH ULTRA TEST test strip TEST THREE TIMES A DAY AS DIRECTED. 02/04/15   Daphane Rosella, NP  OVER THE COUNTER MEDICATION Take 1  capsule by mouth 2 (two) times daily. Omega XL    [provider]  pantoprazole  (PROTONIX ) 40 MG tablet Take 1 tablet (40 mg total) by mouth daily. Stop taking if no longer on decadron . 02/01/24   Wilson, Tara N, PA-C  rivaroxaban  (XARELTO ) 20 MG TABS tablet Take 1 tablet (20 mg total) by mouth daily with supper. 02/08/24   Wilson, Tara N, PA-C  Past Surgical History Past Surgical History:  Procedure Laterality Date   ABLATION  01/29/2013   PVI and CTI by Dr Kelsie for atrial flutter and paroxysmal atrial fibrillation   ANTERIOR CERVICAL DECOMP/DISCECTOMY FUSION N/A 04/18/2018   Procedure: ANTERIOR CERVICAL DECOMPRESSION/DISCECTOMY FUSION 2 LEVELS C5-7;  Surgeon: Clois Fret, MD;  Location: [facility] ORS;  Service: Neurosurgery;  Laterality: N/A;   APPLICATION OF CRANIAL NAVIGATION Left 01/29/2024   Procedure: COMPUTER-ASSISTED NAVIGATION, FOR CRANIAL PROCEDURE;  Surgeon: Rosslyn Dino HERO, MD;  Location: [facility] OR;  Service: Neurosurgery;  Laterality: Left;   ATRIAL FIBRILLATION ABLATION N/A 01/29/2013   Procedure: ATRIAL FIBRILLATION ABLATION;  Surgeon: Lynwood JONETTA Kelsie, MD;  Location: [facility] CATH LAB;  Service: Cardiovascular;  Laterality: N/A;   ATRIAL FIBRILLATION ABLATION N/A 11/15/2021   Procedure: ATRIAL FIBRILLATION ABLATION;  Surgeon: Cindie Ole DASEN, MD;  Location: [facility]AS[facility] CV [facility];  Service: Cardiovascular;  Laterality: N/A;   CARDIAC CATHETERIZATION  08/03/2011   COLONOSCOPY WITH PROPOFOL  N/A 02/02/2016   Procedure: COLONOSCOPY WITH PROPOFOL ;  Surgeon: Rogelia Copping, MD;  Location: [facility] ENDOSCOPY;  Service: Endoscopy;  Laterality: N/A;   COLONOSCOPY WITH PROPOFOL  N/A 04/20/2023   Procedure: COLONOSCOPY WITH PROPOFOL ;  Surgeon: Copping Rogelia, MD;  Location: [facility] ENDOSCOPY;  Service: Endoscopy;  Laterality: N/A;   CRANIOTOMY Left 01/29/2024   Procedure:  CRANIOTOMY TUMOR EXCISION;  Surgeon: Rosslyn Dino HERO, MD;  Location: [facility] OR;  Service: Neurosurgery;  Laterality: Left;  LEFT TEMPORAL MASS RESCETION   LUMBAR LAMINECTOMY/DECOMPRESSION MICRODISCECTOMY N/A 06/07/2017   Procedure: LUMBAR LAMINECTOMY/DECOMPRESSION MICRODISCECTOMY 2 LEVELS-L3-4,L4-5;  Surgeon: Clois Fret, MD;  Location: [facility] ORS;  Service: Neurosurgery;  Laterality: N/A;   POLYPECTOMY  04/20/2023   Procedure: POLYPECTOMY;  Surgeon: Copping Rogelia, MD;  Location: [facility] ENDOSCOPY;  Service: Endoscopy;;   RIGHT/LEFT HEART CATH AND CORONARY ANGIOGRAPHY N/A 03/06/2020   Procedure: RIGHT/LEFT HEART CATH AND CORONARY ANGIOGRAPHY;  Surgeon: Perla Evalene PARAS, MD;  Location: [facility] INVASIVE CV LAB;  Service: Cardiovascular;  Laterality: N/A;   TEE WITHOUT CARDIOVERSION N/A 01/28/2013   Procedure: TRANSESOPHAGEAL ECHOCARDIOGRAM (TEE);  Surgeon: Redell GORMAN Shallow, MD;  Location: [facility] ENDOSCOPY;  Service: Cardiovascular;  Laterality: N/A;   Family History Family History  Problem Relation Age of Onset   Stroke Mother    Heart disease Mother        pacemaker   Heart attack Father 74   Throat cancer Father        throat   Heart disease Paternal Grandfather        MI   Diabetes Son    Heart attack Maternal Uncle     Social History Social History   Tobacco Use   Smoking status: Former    Current packs/day: 0.00    Average packs/day: 1.5 packs/day for 35.0 years (52.5 ttl pk-yrs)    Types: Cigarettes    Start date: 01/15/1973    Quit date: 01/16/2008    Years since quitting: 16.0   Smokeless tobacco: Never   Tobacco comments:    Former smoker 12/09/21  Vaping Use   Vaping status: Never Used  Substance Use Topics   Alcohol use: Not Currently    Comment: occasional   Drug use: No   Allergies Patient has no known allergies.  Review of Systems A thorough review of systems was obtained and all systems are negative except as noted in the HPI and PMH.   Physical Exam Vital Signs   I have reviewed the triage vital signs BP (!) 158/68   Pulse 66  Temp 98.1 F (36.7 C) (Oral)   Resp 20   Ht 5' 11 (1.803 m)   SpO2 97%   BMI 33.89 kg/m  Physical Exam Vitals and nursing note reviewed.  Constitutional:      General: He is not in acute distress.    Appearance: Normal appearance. He is well-developed. He is not ill-appearing.  HENT:     Head: Normocephalic and atraumatic.     Right Ear: External ear normal.     Left Ear: External ear normal.     Nose: Nose normal.     Mouth/Throat:     Mouth: Mucous membranes are moist.  Eyes:     General: No scleral icterus.       Right eye: No discharge.        Left eye: No discharge.  Cardiovascular:     Rate and Rhythm: Normal rate.  Pulmonary:     Effort: Pulmonary effort is normal. No respiratory distress.     Breath sounds: No stridor.  Abdominal:     General: Abdomen is flat. There is no distension.     Palpations: Abdomen is soft.     Tenderness: There is no guarding.  Musculoskeletal:        General: No deformity.     Cervical back: No rigidity.     Comments: Left leg is cool to the touch, no palpable DP or PT pulse.   Skin:    General: Skin is warm and dry.     Coloration: Skin is not cyanotic, jaundiced or pale.  Neurological:     Mental Status: He is alert.  Psychiatric:        Speech: Speech normal.        Behavior: Behavior normal. Behavior is cooperative.     ED Results and Treatments Labs (all labs ordered are listed, but only abnormal results are displayed) Labs Reviewed  CBC WITH DIFFERENTIAL/PLATELET - Abnormal; Notable for the following components:      Result Value   WBC 18.0 (*)    Neutro Abs 16.4 (*)    Abs Immature Granulocytes 0.16 (*)    All other components within normal limits  BASIC METABOLIC PANEL WITH GFR - Abnormal; Notable for the following components:   CO2 18 (*)    Glucose, Bld 192 (*)    BUN 30 (*)    Creatinine, Ser 1.28 (*)    Calcium  7.9 (*)    All other  components within normal limits  HEPARIN  LEVEL (UNFRACTIONATED) - Abnormal; Notable for the following components:   Heparin  Unfractionated >1.10 (*)    All other components within normal limits  GLUCOSE, CAPILLARY - Abnormal; Notable for the following components:   Glucose-Capillary 187 (*)    All other components within normal limits  APTT  I-STAT CG4 LACTIC ACID, ED  I-STAT CG4 LACTIC ACID, ED  TYPE AND SCREEN  Radiology VAS US  LOWER EXTREMITY VENOUS (DVT) Result Date: 02/09/2024  Lower Venous DVT Study Patient Name:  DRYSTAN READER  Date of Exam:   02/09/2024 Medical Rec #: [id number]         Accession #:    [id number] Date of Birth: 1955/04/12         Patient Gender: M Patient Age:   63 years Exam Location:  [address] Procedure:      VAS US  LOWER EXTREMITY VENOUS (DVT) Referring Phys: DINO SABLE --------------------------------------------------------------------------------  Indications: Pain. Other Indications: Patient states left foot pain since brain surgery a few weeks                    ago. Feels better with warm blanket. Left leg is cold with                    pallor. Performing Technologist: King Pierre RVT Supporting Technologist: Stoney Ross RVT  Examination Guidelines: A complete evaluation includes B-mode imaging, spectral Doppler, color Doppler, and power Doppler as needed of all accessible portions of each vessel. Bilateral testing is considered an integral part of a complete examination. Limited examinations for reoccurring indications may be performed as noted. The reflux portion of the exam is performed with the patient in reverse Trendelenburg.  +---------+---------------+---------+-----------+----------+--------------+ LEFT     CompressibilityPhasicitySpontaneityPropertiesThrombus Aging  +---------+---------------+---------+-----------+----------+--------------+ CFV      Full           Yes      Yes                                 +---------+---------------+---------+-----------+----------+--------------+ SFJ      Full                    Yes                                 +---------+---------------+---------+-----------+----------+--------------+ FV Prox  Full           Yes      Yes                                 +---------+---------------+---------+-----------+----------+--------------+ FV Mid   Full           Yes      Yes                                 +---------+---------------+---------+-----------+----------+--------------+ FV DistalFull           Yes      Yes                                 +---------+---------------+---------+-----------+----------+--------------+ POP      Full           Yes      Yes                                 +---------+---------------+---------+-----------+----------+--------------+ PTV      Full                    Yes                                 +---------+---------------+---------+-----------+----------+--------------+  PERO     Full                    Yes                                 +---------+---------------+---------+-----------+----------+--------------+  Findings reported to Dr. Junjua via RN at 12:50. Patient referred to ED.  Summary: LEFT: - There is no evidence of deep vein thrombosis in the lower extremity.  - Incidental finding of left femoral artery, popliteal artery, posterior tibial and peroneal artery occlusion. Dr. Sherl requested patient be sent to the ED.  *See table(s) above for measurements and observations. Electronically signed by Norman Serve on 02/09/2024 at 1:11:05 PM.    Final     Pertinent labs & imaging results that were available during my care of the patient were reviewed by me and considered in my medical decision making (see MDM for details).  Medications Ordered in  ED Medications  heparin  ADULT infusion 100 units/mL (25000 units/250mL) (1,500 Units/hr Intravenous New Bag/Given 02/09/24 1418)  lactated ringers  infusion ( Intravenous New Bag/Given 02/09/24 1454)  insulin  aspart (novoLOG ) injection 0-14 Units (4 Units Subcutaneous Given 02/09/24 1502)  insulin  aspart (novoLOG ) 100 UNIT/ML injection (has no administration in time range)  ceFAZolin  (ANCEF ) 2-4 GM/100ML-% IVPB (has no administration in time range)  chlorhexidine  (PERIDEX) 0.12 % solution 15 mL (15 mLs Mouth/Throat Given 02/09/24 1455)    Or  Oral care mouth rinse ( Mouth Rinse See Alternative 02/09/24 1455)                                                                                                                                     Procedures .Critical Care  Performed by: Elnor Jayson LABOR, DO Authorized by: Elnor Jayson LABOR, DO   Critical care provider statement:    Critical care time (minutes):  30   Critical care time was exclusive of:  Separately billable procedures and treating other patients   Critical care was necessary to treat or prevent imminent or life-threatening deterioration of the following conditions:  Circulatory failure   Critical care was time spent personally by me on the following activities:  Development of treatment plan with patient or surrogate, discussions with consultants, evaluation of patient's response to treatment, examination of patient, ordering and review of laboratory studies, ordering and review of radiographic studies, ordering and performing treatments and interventions, pulse oximetry, re-evaluation of patient's condition, review of old charts and obtaining history from patient or surrogate   Care discussed with: admitting provider     (including critical care time)  Medical Decision Making / ED Course    Medical Decision Making:    Douglas Hunter is a 68 y.o. male with past medical history as below, significant for atrial fibrillation on Xarelto ,  DM, glioblastoma who presents to the ED with complaint  of left leg pain. The complaint involves an extensive differential diagnosis and also carries with it a high risk of complications and morbidity.  Serious etiology was considered. Ddx includes but is not limited to: Vascular insufficiency, arterial occlusion, VTE, ischemia, compartment syndrome, etc.  Complete initial physical exam performed, notably the patient was in no acute distress.    Reviewed and confirmed nursing documentation for past medical history, family history, social history.  Vital signs reviewed.    Left leg ischemia Left femoral artery occlusion> - Exam is concerning for limb ischemia, it is cool to the touch, no palpable DP or PT pulse.  He has delayed capillary refill to his foot. - Duplex concerning for femoral occlusion - Vascular surgery has evaluated patient, planning for OR, possible fasciotomy  - Heparin  - Patient reports his pain is mild currently and does not want any pain medication at this time. - Patient, family are agreeable to plan for admission - dispo per vascular                       Additional history obtained: -Additional history obtained from family -External records from outside source obtained and reviewed including: Chart review including previous notes, labs, imaging, consultation notes including  Recent neurosurgery documentation, recent duplex   Lab Tests: -I ordered, reviewed, and interpreted labs.   The pertinent results include:   Labs Reviewed  CBC WITH DIFFERENTIAL/PLATELET - Abnormal; Notable for the following components:      Result Value   WBC 18.0 (*)    Neutro Abs 16.4 (*)    Abs Immature Granulocytes 0.16 (*)    All other components within normal limits  BASIC METABOLIC PANEL WITH GFR - Abnormal; Notable for the following components:   CO2 18 (*)    Glucose, Bld 192 (*)    BUN 30 (*)    Creatinine, Ser 1.28 (*)    Calcium  7.9 (*)    All other  components within normal limits  HEPARIN  LEVEL (UNFRACTIONATED) - Abnormal; Notable for the following components:   Heparin  Unfractionated >1.10 (*)    All other components within normal limits  GLUCOSE, CAPILLARY - Abnormal; Notable for the following components:   Glucose-Capillary 187 (*)    All other components within normal limits  APTT  I-STAT CG4 LACTIC ACID, ED  I-STAT CG4 LACTIC ACID, ED  TYPE AND SCREEN    Notable for cr similar to prior, wbc + similar to prior  EKG   EKG Interpretation Date/Time:    Ventricular Rate:    PR Interval:    QRS Duration:    QT Interval:    QTC Calculation:   R Axis:      Text Interpretation:           Imaging Studies ordered: Reviewed outpatient ultrasound, agree with radiologist  Medicines ordered and prescription drug management: Meds ordered this encounter  Medications   heparin  ADULT infusion 100 units/mL (25000 units/250mL)   lactated ringers  infusion   OR Linked Order Group    chlorhexidine  (PERIDEX) 0.12 % solution 15 mL    Oral care mouth rinse   chlorhexidine  (PERIDEX) 0.12 % solution    Bowman, Mahala O: cabinet override   insulin  aspart (novoLOG ) injection 0-14 Units    Correction coverage::   Periop Moderate    CBG < 70::   Implement Hypoglycemia Standing Orders and refer to Hypoglycemia Standing Orders sidebar report    CBG 70 - 139::   0 units  CBG 140 - 179::   2 units    CBG 180 - 219::   4 units    CBG 220 - 259::   6 units    CBG 260 - 299::   8 units    CBG 300 - 339::   10 units. Consult anesthesiologist prior to admin    CBG 340 - 379::   12 units. Consult anesthesiologist prior to admin    CBG 380 - 399::   14 units. Consult anesthesiologist prior to admin    CBG >399::   Notify anesthesiologist   insulin  aspart (novoLOG ) 100 UNIT/ML injection    Bowman, Mahala O: cabinet override   ceFAZolin  (ANCEF ) 2-4 GM/100ML-% IVPB    Bowman, Mahala O: cabinet override    -I have reviewed the patients home  medicines and have made adjustments as needed   Consultations Obtained: I requested consultation with the vascular surgery Dr. Magda,  and discussed lab and imaging findings as well as pertinent plan - they recommend: OR   Cardiac Monitoring: Continuous pulse oximetry interpreted by myself, 97% on RA.    Social Determinants of Health:  Diagnosis or treatment significantly limited by social determinants of health: former smoker and obesity   Reevaluation: After the interventions noted above, I reevaluated the patient and found that they have improved  Co morbidities that complicate the patient evaluation  Past Medical History:  Diagnosis Date   AKI (acute kidney injury) 03/13/2020   Atrial flutter (HCC)    Coronary artery disease 08/2011   a. NSTEMI 08/2011, LHC w/ severe disease of LCx with good collaterals, lesion was high-risk for intervention due to a steep angulation of the LCx coming from the left main coronary artery as well as heavy calcifications, discused at cath conference w/ consensus advising med Rx; b. 03/2020 Cath: LM nl, LAD 76m, LCX 100p w/ R->L and L->L collats. RCA dominant, nl. EF 55%. PA 33/6/19, PCWP 8.   GERD (gastroesophageal reflux disease)    H/O brain tumor 01/2024   History of echocardiogram    a. 01/2020 Echo: EF 60-65%, no rwma. Sev elev PASP. Mod dil LA.   Hyperlipidemia    Hypertension    Myocardial infarction [facility])    Paroxysmal atrial fibrillation (HCC)    a. s/p ablation 01/2013; b CHADS2VASc => 3 (HTN, DM, vascular disease); c. on Coumadin ; d. 12/2019 Amio d/c'd 2/2 hyperthyroidism.   Poorly controlled diabetes mellitus (HCC)    RBBB    Sleep apnea    uses nightly      Dispostion: Disposition decision including need for hospitalization was considered, and patient admitted to the hospital.    Final Clinical Impression(s) / ED Diagnoses Final diagnoses:  Acute lower limb ischemia        Elnor Jayson LABOR, DO 02/09/24 1607

## 2024-02-09 NOTE — Telephone Encounter (Signed)
 Discussed with Dr. Janjua. Decision was made to send patient to the ER. Called Rad Tech at

## 2024-02-09 NOTE — Anesthesia Preprocedure Evaluation (Addendum)
 Anesthesia Evaluation  Patient identified by MRN, date of birth, ID band Patient awake    Reviewed: Allergy & Precautions, NPO status , Patient's Chart, lab work & pertinent test results  History of Anesthesia Complications Negative for: history of anesthetic complications  Airway Mallampati: II  TM Distance: >3 FB Neck ROM: Full    Dental  (+) Teeth Intact, Dental Advisory Given, Chipped   Pulmonary sleep apnea and Continuous Positive Airway Pressure Ventilation , former smoker   breath sounds clear to auscultation       Cardiovascular hypertension, (-) angina + CAD  + dysrhythmias (s/p Ablation (on Tikosyn , Xarelto )) Atrial Fibrillation  Rhythm:Regular Rate:Normal  TTE (2025): 1. Left ventricular ejection fraction, by estimation, is 60 to 65%. The  left ventricle has normal function. The left ventricle has no regional  wall motion abnormalities. Left ventricular diastolic parameters are  consistent with Grade II diastolic  dysfunction (pseudonormalization).   2. Right ventricular systolic function is normal. The right ventricular  size is normal. There is mildly elevated pulmonary artery systolic  pressure. The estimated right ventricular systolic pressure is 38.9 mmHg.   3. Left atrial size was moderately dilated.   4. The mitral valve is normal in structure. No evidence of mitral valve  regurgitation. No evidence of mitral stenosis. Moderate mitral annular  calcification.   5. The aortic valve was not well visualized. Aortic valve regurgitation  is not visualized. No aortic stenosis is present.   6. The inferior vena cava is normal in size with greater than 50%  respiratory variability, suggesting right atrial pressure of 3 mmHg.     Neuro/Psych Glioblastoma s/p Resection    GI/Hepatic ,GERD  Medicated,,  Endo/Other  diabetes, Type 2  Dexamethasone  4 mg Q8H 2/2 Glioblastoma  Renal/GU Renal disease      Musculoskeletal   Abdominal   Peds  Hematology   Anesthesia Other Findings   Reproductive/Obstetrics                              Anesthesia Physical Anesthesia Plan  ASA: 3 and emergent  Anesthesia Plan: General   Post-op Pain Management:    Induction: Intravenous  PONV Risk Score and Plan: 2 and Ondansetron , Dexamethasone  and Treatment may vary due to age or medical condition  Airway Management Planned: Oral ETT  Additional Equipment: Arterial line  Intra-op Plan:   Post-operative Plan: Extubation in OR  Informed Consent:      Dental advisory given  Plan Discussed with: CRNA  Anesthesia Plan Comments:          Anesthesia Quick Evaluation

## 2024-02-09 NOTE — Consult Note (Addendum)
 Hospital Consult    Reason for Consult: LLE ischemia Requesting Physician:  ED MRN #:  [id number]  History of Present Illness: Douglas Hunter is a 68 y.o. male with a PMH of atrial fibrillation on Xarelto , DMII, glioblastoma with recent excision on 01/29/2024, HTN, and MI who presents to the ED today with severe left leg pain.  The patient recently underwent resection of a left temporal tumor on 01/29/2024.  When he went to see his neurosurgeon today in clinic, it was noted that he had severe pain in his left leg and coolness to touch.  He was sent to the ED for further evaluation.  Left lower extremity DVT ultrasound was negative for DVT.  Incidental finding of occlusion of the left femoral artery, popliteal artery, posterior tibial, and peroneal arteries.  We were consulted for further evaluation.  The patient states that he has had worsening weakness and pain in his left lower leg and foot for the past 3 days.  He says that it is very painful to try to move his foot at all.  He denies any numbness.  He denies any prior lower extremity vascular interventions.  He denies any trauma to the leg.   He recently restarted his Xarelto  yesterday evening.  Past Medical History:  Diagnosis Date   AKI (acute kidney injury) 03/13/2020   Atrial flutter (HCC)    Coronary artery disease 08/2011   a. NSTEMI 08/2011, LHC w/ severe disease of LCx with good collaterals, lesion was high-risk for intervention due to a steep angulation of the LCx coming from the left main coronary artery as well as heavy calcifications, discused at cath conference w/ consensus advising med Rx; b. 03/2020 Cath: LM nl, LAD 37m, LCX 100p w/ R->L and L->L collats. RCA dominant, nl. EF 55%. PA 33/6/19, PCWP 8.   GERD (gastroesophageal reflux disease)    H/O brain tumor 01/2024   History of echocardiogram    a. 01/2020 Echo: EF 60-65%, no rwma. Sev elev PASP. Mod dil LA.   Hyperlipidemia    Hypertension    Myocardial infarction  [facility])    Paroxysmal atrial fibrillation (HCC)    a. s/p ablation 01/2013; b CHADS2VASc => 3 (HTN, DM, vascular disease); c. on Coumadin ; d. 12/2019 Amio d/c'd 2/2 hyperthyroidism.   Poorly controlled diabetes mellitus (HCC)    RBBB    Sleep apnea    uses nightly    Past Surgical History:  Procedure Laterality Date   ABLATION  01/29/2013   PVI and CTI by Dr Kelsie for atrial flutter and paroxysmal atrial fibrillation   ANTERIOR CERVICAL DECOMP/DISCECTOMY FUSION N/A 04/18/2018   Procedure: ANTERIOR CERVICAL DECOMPRESSION/DISCECTOMY FUSION 2 LEVELS C5-7;  Surgeon: Clois Fret, MD;  Location: [facility] ORS;  Service: Neurosurgery;  Laterality: N/A;   APPLICATION OF CRANIAL NAVIGATION Left 01/29/2024   Procedure: COMPUTER-ASSISTED NAVIGATION, FOR CRANIAL PROCEDURE;  Surgeon: Rosslyn Dino HERO, MD;  Location: [facility] OR;  Service: Neurosurgery;  Laterality: Left;   ATRIAL FIBRILLATION ABLATION N/A 01/29/2013   Procedure: ATRIAL FIBRILLATION ABLATION;  Surgeon: Lynwood JONETTA Kelsie, MD;  Location: [facility] CATH LAB;  Service: Cardiovascular;  Laterality: N/A;   ATRIAL FIBRILLATION ABLATION N/A 11/15/2021   Procedure: ATRIAL FIBRILLATION ABLATION;  Surgeon: Cindie Ole DASEN, MD;  Location: [facility] INVASIVE CV LAB;  Service: Cardiovascular;  Laterality: N/A;   CARDIAC CATHETERIZATION  08/03/2011   COLONOSCOPY WITH PROPOFOL  N/A 02/02/2016   Procedure: COLONOSCOPY WITH PROPOFOL ;  Surgeon: Rogelia Copping, MD;  Location: [facility] ENDOSCOPY;  Service: Endoscopy;  Laterality:  N/A;   COLONOSCOPY WITH PROPOFOL  N/A 04/20/2023   Procedure: COLONOSCOPY WITH PROPOFOL ;  Surgeon: Jinny Carmine, MD;  Location: [facility] ENDOSCOPY;  Service: Endoscopy;  Laterality: N/A;   CRANIOTOMY Left 01/29/2024   Procedure: CRANIOTOMY TUMOR EXCISION;  Surgeon: Rosslyn Dino HERO, MD;  Location: [facility] OR;  Service: Neurosurgery;  Laterality: Left;  LEFT TEMPORAL MASS RESCETION   LUMBAR LAMINECTOMY/DECOMPRESSION MICRODISCECTOMY N/A 06/07/2017   Procedure: LUMBAR  LAMINECTOMY/DECOMPRESSION MICRODISCECTOMY 2 LEVELS-L3-4,L4-5;  Surgeon: Clois Fret, MD;  Location: [facility] ORS;  Service: Neurosurgery;  Laterality: N/A;   POLYPECTOMY  04/20/2023   Procedure: POLYPECTOMY;  Surgeon: Jinny Carmine, MD;  Location: [facility] ENDOSCOPY;  Service: Endoscopy;;   RIGHT/LEFT HEART CATH AND CORONARY ANGIOGRAPHY N/A 03/06/2020   Procedure: RIGHT/LEFT HEART CATH AND CORONARY ANGIOGRAPHY;  Surgeon: Perla Evalene PARAS, MD;  Location: [facility] INVASIVE CV LAB;  Service: Cardiovascular;  Laterality: N/A;   TEE WITHOUT CARDIOVERSION N/A 01/28/2013   Procedure: TRANSESOPHAGEAL ECHOCARDIOGRAM (TEE);  Surgeon: Redell GORMAN Shallow, MD;  Location: [facility] ENDOSCOPY;  Service: Cardiovascular;  Laterality: N/A;    No Known Allergies  Prior to Admission medications   Medication Sig Start Date End Date Taking? Authorizing Provider  acetaminophen  (TYLENOL ) 500 MG tablet Take 500 mg by mouth 4 (four) times daily as needed for mild pain or moderate pain.    [provider]  amLODipine  (NORVASC ) 10 MG tablet Take 1 tablet (10 mg total) by mouth daily. 02/01/24   Wilson, Tara N, PA-C  atorvastatin  (LIPITOR ) 80 MG tablet Take 80 mg by mouth at bedtime.    [provider]  baclofen  (LIORESAL ) 10 MG tablet Take 1 tablet (10 mg total) by mouth 2 (two) times daily as needed (neck pain, muscle spasms). 01/31/24 03/01/24  Wilson, Tara N, PA-C  benazepril  (LOTENSIN ) 40 MG tablet TAKE 1 TABLET BY MOUTH ONCE DAILY 02/03/23   Gollan, Timothy J, MD  butalbital-acetaminophen -caffeine (FIORICET) 50-325-40 MG tablet Take 2 tablets by mouth every 4 (four) hours as needed for headache (Moderate pain). 01/31/24   Wilson, Tara N, PA-C  cholecalciferol  (VITAMIN D3) 25 MCG (1000 UNIT) tablet Take 500 Units by mouth daily.    [provider]  Continuous Blood Gluc Sensor (FREESTYLE LIBRE 14 DAY SENSOR) MISC SMARTSIG:1 Kit(s) Topical Every 2 Weeks 03/29/20   [provider]  dexamethasone   (DECADRON ) 4 MG tablet Take 1 tablet (4 mg total) by mouth every 8 (eight) hours. Patient taking differently: Take 4 mg by mouth daily. 01/31/24 01/30/25  Wilson, Tara N, PA-C  dofetilide  (TIKOSYN ) 500 MCG capsule TAKE 1 CAPSULE BY MOUTH TWICE DAILY 09/22/23   Cindie Ole DASEN, MD  ezetimibe  (ZETIA ) 10 MG tablet TAKE 1 TABLET BY MOUTH ONCE DAILY 03/06/23   Gollan, Timothy J, MD  furosemide  (LASIX ) 20 MG tablet TAKE 1 TABLET BY MOUTH TWICE DAILY AS NEEDED 12/29/22   Gollan, Timothy J, MD  insulin  glargine (LANTUS ) 100 UNIT/ML injection Inject 50 Units into the skin 2 (two) times daily.    [provider]  insulin  lispro (HUMALOG ) 100 UNIT/ML KiwkPen Inject 16-22 Units into the skin 2 (two) times daily. Sliding Scale 12/13/17 05/27/28  [provider]  INSULIN  SYRINGE 1CC/29G 29G X 1/2 1 ML MISC USE AS DIRECTED 05/27/15   Daphane Rosella, NP  JARDIANCE 25 MG TABS tablet Take 12.5 mg by mouth daily.    [provider]  levETIRAcetam (KEPPRA) 500 MG tablet Take 1 tablet (500 mg total) by mouth 2 (two) times daily. 01/24/24 02/23/24  Levander Slate, MD  metoprolol  succinate (TOPROL -XL) 25 MG 24 hr tablet TAKE 1 TABLET BY MOUTH ONCE DAILY 02/06/24   Gollan, Timothy J, MD  nitroGLYCERIN  (NITROSTAT ) 0.4 MG SL tablet Place 1 tablet (0.4 mg total) under the tongue every 5 (five) minutes as needed for chest pain. 07/31/23   Gollan, Timothy J, MD  ONE TOUCH ULTRA TEST test strip TEST THREE TIMES A DAY AS DIRECTED. 02/04/15   Daphane Rosella, NP  OVER THE COUNTER MEDICATION Take 1 capsule by mouth 2 (two) times daily. Omega XL    [provider]  pantoprazole  (PROTONIX ) 40 MG tablet Take 1 tablet (40 mg total) by mouth daily. Stop taking if no longer on decadron . 02/01/24   Wilson, Tara N, PA-C  rivaroxaban  (XARELTO ) 20 MG TABS tablet Take 1 tablet (20 mg total) by mouth daily with supper. 02/08/24   Tanda Rexene SAILOR, PA-C    Social History   Socioeconomic History   Marital status:  Married    Spouse name: Not on file   Number of children: 2   Years of education: Not on file   Highest education level: Not on file  Occupational History   Not on file  Tobacco Use   Smoking status: Former    Current packs/day: 0.00    Average packs/day: 1.5 packs/day for 35.0 years (52.5 ttl pk-yrs)    Types: Cigarettes    Start date: 01/15/1973    Quit date: 01/16/2008    Years since quitting: 16.0   Smokeless tobacco: Never   Tobacco comments:    Former smoker 12/09/21  Vaping Use   Vaping status: Never Used  Substance and Sexual Activity   Alcohol use: Not Currently    Comment: occasional   Drug use: No   Sexual activity: Not Currently  Other Topics Concern   Not on file  Social History Narrative   Pt lives in [place] (right outside of [place])   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 [facility]   Overall Financial Resource Strain (CARDIA)    Difficulty of Paying Living Expenses: Not hard at all  Food Insecurity: No Food Insecurity (02/08/2024)   Hunger Vital Sign    Worried About Running Out of Food in the Last Year: Never true    Ran Out of Food in the Last Year: Never true  Transportation Needs: No Transportation Needs (02/08/2024)   PRAPARE - Administrator, Civil Service (Medical): No    Lack of Transportation (Non-Medical): No  Physical Activity: Not on file  Stress: Not on file  Social Connections: Not on file  Intimate Partner Violence: Not At Risk (02/08/2024)   Humiliation, Afraid, Rape, and Kick questionnaire    Fear of Current or Ex-Partner: No    Emotionally Abused: No    Physically Abused: No    Sexually Abused: No    Family History  Problem Relation Age of Onset   Stroke Mother    Heart disease Mother        pacemaker   Heart attack Father 75   Throat cancer Father        throat   Heart  disease Paternal Grandfather        MI   Diabetes Son    Heart attack Maternal Uncle     ROS: Otherwise negative unless mentioned in HPI  Physical Examination  Vitals:   02/09/24 1325  BP: (!) 153/75  Pulse: 78  Resp: (!) 23  Temp: 97.8 F (36.6 C)  SpO2: 94%   There is no height or weight on file to calculate BMI.  General:  WDWN in NAD Gait: Not observed Pulmonary: normal non-labored breathing Cardiac: regular Abdomen:  soft, NT/ND, no masses Skin: without rashes Vascular Exam/Pulses: Palpable femoral pulses bilaterally.  Nonpalpable left popliteal or pedal pulses.  No Doppler signals in the left popliteal artery or left DP/PT/peroneal.  2+ palpable right DP pulse Extremities: Left leg is cool and tender to touch from the knee down.  Intact sensation of the left foot.  Decreased dorsiflexion/plantarflexion of the left foot.  Unable to wiggle the toes Musculoskeletal: no muscle wasting or atrophy  Neurologic: A&O X 3;  No focal weakness or paresthesias are detected; speech is fluent/normal Psychiatric:  The pt has Normal affect. Lymph:  Unremarkable  CBC    Component Value Date/Time   WBC 20.0 (H) 01/31/2024 0234   RBC 4.76 01/31/2024 0234   HGB 14.7 01/31/2024 0234   HGB 13.8 06/28/2023 0946   HCT 43.4 01/31/2024 0234   HCT 41.7 06/28/2023 0946   PLT 272 01/31/2024 0234   PLT 232 06/28/2023 0946   MCV 91.2 01/31/2024 0234   MCV 93 06/28/2023 0946   MCV 95 03/02/2014 1416   MCH 30.9 01/31/2024 0234   MCHC 33.9 01/31/2024 0234   RDW 13.3 01/31/2024 0234   RDW 12.3 06/28/2023 0946   RDW 12.8 03/02/2014 1416   LYMPHSABS 1.5 03/13/2020 0044   LYMPHSABS 2.0 10/26/2016 1423   LYMPHSABS 1.5 03/02/2014 1416   MONOABS 1.2 (H) 03/13/2020 0044   MONOABS 1.3 (H) 03/02/2014 1416   EOSABS 0.2 03/13/2020 0044   EOSABS 0.3 10/26/2016 1423   EOSABS 0.3 03/02/2014 1416   BASOSABS 0.1 03/13/2020 0044   BASOSABS 0.0 10/26/2016 1423   BASOSABS 0.1 03/02/2014 1416     BMET    Component Value Date/Time   NA 131 (L) 01/30/2024 1236   NA 140 06/28/2023 0946   NA 131 (L) 03/02/2014 1416   K 4.3 01/30/2024 1236   K 4.1 03/02/2014 1416   CL 105 01/30/2024 1236   CL 98 03/02/2014 1416   CO2 16 (L) 01/30/2024 1236   CO2 24 03/02/2014 1416   GLUCOSE 227 (H) 01/30/2024 1236   GLUCOSE 374 (H) 03/02/2014 1416   BUN 38 (H) 01/30/2024 1236   BUN 26 06/28/2023 0946   BUN 18 03/02/2014 1416   CREATININE 1.46 (H) 01/30/2024 1236   CREATININE 1.27 03/02/2014 1416   CALCIUM  8.1 (L) 01/30/2024 1236   CALCIUM  8.7 03/02/2014 1416   GFRNONAA 52 (L) 01/30/2024 1236   GFRNONAA >60 03/02/2014 1416   GFRNONAA >60 01/12/2013 0013   GFRAA 53 (L) 02/20/2020 0925   GFRAA >60 03/02/2014 1416   GFRAA >60 01/12/2013 0013    COAGS: Lab Results  Component Value Date   INR 2.6 03/16/2020   INR 1.8 (H) 03/13/2020   INR 1.4 (A) 03/11/2020     Non-Invasive Vascular Imaging:   LLE DVT Study (02/09/2024) - Negative for DVT -Occlusion of the left femoral artery, popliteal artery, posterior tibial artery, and peroneal artery   ASSESSMENT/PLAN: This is a 68 y.o. male who presents to the ED with left lower extremity pain   - The patient presents to the ED today with worsening left lower extremity pain and weakness.  He recently underwent left craniotomy with tumor resection  on 01/29/2024 -Left lower extremity DVT ultrasound was negative today for DVT.  There was incidental finding of left femoral, popliteal, posterior tibial, and peroneal artery occlusion -The patient states that he has had worsening pain and weakness in his left lower leg and foot for the past 3 days.  He denies any numbness. - On exam he has palpable femoral pulses bilaterally.  He has a nonpalpable left popliteal or pedal pulse.  He also has no Doppler signals in the popliteal artery or in the foot on the left.  He has an easily palpable right DP pulse.  His left leg is tender and cool from the knee  down.  He has intact sensation of the left foot with reduced dorsiflexion/plantarflexion - The patient was co-evaluated with Dr. Magda.  We explained to the patient and his wife that he appears to have acute limb ischemia of the left lower extremity, which places him at risk for life and/or limb loss.  He will require emergent left lower extremity revascularization, likely including left lower extremity thrombectomy.  He will also require left lower extremity 4 compartment fasciotomies.  Of note he did restart his Xarelto  yesterday evening.  The patient is agreeable to surgery. - He will be started on a heparin  drip and we will plan for surgery later this afternoon   Ahmed Holster PA-C [facility] [phone]   VASCULAR STAFF ADDENDUM: I have independently interviewed and examined the patient. I agree with the above.  [place] 2B left leg ischemia with motor weakness and sensory change of the foot. Palpable left femoral pulse; no doppler flow in tibial vessels.  Plan emergent OR for thrombectomy and fasciotomy. Counseled about risk of limb loss with and without successful surgery. Chronically ill, would benefit from medicine admission.  Debby SAILOR. Magda, MD [facility] Phone Number: [phone] 02/09/2024 3:33 PM

## 2024-02-09 NOTE — Anesthesia Procedure Notes (Signed)
 Arterial Line Insertion Start/End11/10/2023 3:30 PM, 02/09/2024 3:40 PM Performed by: Waddell Lauraine NOVAK, MD, anesthesiologist  Patient location: Pre-op. Preanesthetic checklist: patient identified, IV checked, site marked, risks and benefits discussed, surgical consent, monitors and equipment checked, pre-op evaluation, timeout performed and anesthesia consent Lidocaine  1% used for infiltration Left, radial was placed Catheter size: 20 G Hand hygiene performed  and Seldinger technique used Allen's test indicative of satisfactory collateral circulation Attempts: 2 Procedure performed using ultrasound to evaluate access site. Ultrasound Notes:relevant anatomy identified, ultrasound used to visualize needle entry and vessel patent under ultrasound. Following insertion, Biopatch and dressing applied. Post procedure assessment: normal  Patient tolerated the procedure well with no immediate complications.

## 2024-02-09 NOTE — Telephone Encounter (Signed)
 I got a urgent call from one of the vascular [facility]s [facility]. Patient does not have a DVT.   Per imaging, patient has a Left arterial occulusion in mid femoral artery that goes down to the calf. Helen the vascular tech states that this has been going on since his surgery, his lower extremity is cold to the touch.   She advised I notify Dr. Janjua immediately for next steps. They suggest sending him to the ER or asking one of the vascular doctors to see the patient ASAP.   They are holding the patient until I get direction from Dr. Janjua regarding next steps.

## 2024-02-09 NOTE — Anesthesia Postprocedure Evaluation (Signed)
 Anesthesia Post Note  Patient: Deleon Passe Baskette  Procedure(s) Performed: THROMBECTOMY OF LEFT FEMORAL AND POTERIOR TIBIAL  ARTERY (Left: Leg Lower) ANGIOPLASTY LEFT FEMORAL ARTERY USING XENOSURE BOVINE PERICARDIUM PATCH (Groin) LEFT FEMORAL ENDARTERECTOMY (Left: Groin) INTRAOPERATIVE LEFT LOWER EXTREMITY ANGIOGRAM (Left: Leg Lower) 4 COMPARTMENT FASCIECTOMY, LEFT LOWER EXTREMITY (Left: Leg Lower)     Patient location during evaluation: PACU Anesthesia Type: General Level of consciousness: sedated and patient cooperative Pain management: pain level controlled Vital Signs Assessment: post-procedure vital signs reviewed and stable Respiratory status: spontaneous breathing Cardiovascular status: stable Anesthetic complications: no   No notable events documented.  Last Vitals:  Vitals:   02/09/24 2000 02/09/24 2015  BP: (!) 154/66 (!) 146/67  Pulse: 87 85  Resp: (!) 23 (!) 25  Temp:    SpO2: 95% 95%    Last Pain:  Vitals:   02/09/24 2033  TempSrc:   PainSc: 7                  Norleen Pope

## 2024-02-09 NOTE — Anesthesia Procedure Notes (Signed)
 Procedure Name: Intubation Date/Time: 02/09/2024 4:06 PM  Performed by: Marva Lonni PARAS, CRNAPre-anesthesia Checklist: Patient identified, Emergency Drugs available, Suction available and Patient being monitored Patient Re-evaluated:Patient Re-evaluated prior to induction Oxygen Delivery Method: Circle System Utilized Preoxygenation: Pre-oxygenation with 100% oxygen Induction Type: IV induction Ventilation: Mask ventilation without difficulty Laryngoscope Size: Mac and 4 Grade View: Grade II Tube type: Oral Tube size: 7.5 mm Number of attempts: 1 Airway Equipment and Method: Stylet and Oral airway Placement Confirmation: ETT inserted through vocal cords under direct vision, positive ETCO2 and breath sounds checked- equal and bilateral Secured at: 23 cm Tube secured with: Tape Dental Injury: Teeth and Oropharynx as per pre-operative assessment

## 2024-02-09 NOTE — Progress Notes (Signed)
   Vascular Lab note of critical results.   Douglas Hunter was sent to the Vascular Lab at [facility] for a venous exam.  Incidental finding of arterial occlusion in the left femoral, popliteal, posterior tibial, and peroneal arteries.  He was directed to the ED for further work up.    Aryon Nham, RVT

## 2024-02-09 NOTE — Progress Notes (Signed)
 PHARMACY - ANTICOAGULATION CONSULT NOTE  Pharmacy Consult for heparin  Indication: Ischemic limb, arterial occulsion  No Known Allergies  Patient Measurements: Height: 5' 11 (180.3 cm) IBW/kg (Calculated) : 75.3  Vital Signs: Temp: 97.6 F (36.4 C) (11/07 1930) Temp Source: Oral (11/07 1446) BP: 124/64 (11/07 2100) Pulse Rate: 80 (11/07 2100)  Labs: Recent Labs    02/09/24 1347 02/09/24 1408  HGB 14.6  --   HCT 44.5  --   PLT 260  --   HEPARINUNFRC  --  >1.10*  CREATININE 1.28*  --     Estimated Creatinine Clearance: 68.8 mL/min (A) (by C-G formula based on SCr of 1.28 mg/dL (H)).   Assessment: 32 YOM presenting with foot pain, recent 10/27 crani with tumor resection, hx of afib on Xarelto  PTA which was held d/t the tumor resection and he was instructed to start back on it 11/6 which his last dose was taken at 1900.  Now here with ischemic limb and arterial occlusion and pharmacy consulted to dose heparin .  No bolus d/t recent tumor resection and proximity to Xarelto  dose.  Heparin  was started in the ED, then turned off for angioplasty, thrombectomy and fasciotomy.  Heparin  was then resumed while patient was in the PACU.  RN reports mild oozing.  Goal of Therapy:  Heparin  level 0.3-0.7 units/ml aPTT 66-102 seconds Monitor platelets by anticoagulation protocol: Yes   Plan:  Continue heparin  gtt at 1500 units/hr, no bolus Check 6 hr aPTT Monitor closely for bleeding  Akeela Busk D. [place], PharmD, BCPS, BCCCP 02/09/2024, 9:15 PM

## 2024-02-09 NOTE — H&P (Signed)
 History and Physical    Douglas Hunter FMW:[id number] DOB: April 07, 1955 DOA: 02/09/2024  PCP: [name], MD  Patient coming from: Home  Chief Complaint: Left leg pain  HPI: Douglas Hunter is a 68 y.o. 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  Patient had an office visit with oncology yesterday and plan is to start radiation and chemotherapy.  His Xarelto  recently was held in the setting of brain surgery and was restarted yesterday.  Patient had a follow-up visit with neurosurgery today and had complained of left leg pain.  He had an outpatient duplex ultrasound done today which was negative for DVT but showed incidental finding of arterial occlusion in the left femoral, popliteal, posterior tibial, and peroneal arteries.  He was sent to the ED for further evaluation.  Patient's left foot was cool to touch with no palpable pulses.  CBC notable for WBC count 18.0.  BMP notable for bicarb 18, anion gap 11, glucose 192, creatinine 1.2 (stable), calcium  7.9.  Lactic acid normal.  Patient was evaluated by vascular surgery and started on IV heparin .  He was taken to the OR for emergent left lower extremity revascularization including thrombectomy and also required left lower extremity 4 compartment fasciotomies.  After surgery, vascular surgery requested hospitalist service to admit the patient and they will consult.  Informed by vascular surgery PA that unfortunately no good blood flow could be restored and patient had dead muscles in his compartments.  As such, he will require left AKA at some point.  Recommended continuing heparin  drip for now.  Patient seen in PACU, resting comfortably.  He has no complaints.  He denies any pain in his leg at this time.  Denies  shortness of breath, chest pain, nausea, vomiting, or abdominal pain.  He is not able to give any additional history at this time.  Review of Systems:  Review of Systems  All other systems reviewed and are negative.   Past Medical History:  Diagnosis Date   AKI (acute kidney injury) 03/13/2020   Atrial flutter (HCC)    Coronary artery disease 08/2011   a. NSTEMI 08/2011, LHC w/ severe disease of LCx with good collaterals, lesion was high-risk for intervention due to a steep angulation of the LCx coming from the left main coronary artery as well as heavy calcifications, discused at cath conference w/ consensus advising med Rx; b. 03/2020 Cath: LM nl, LAD 27m, LCX 100p w/ R->L and L->L collats. RCA dominant, nl. EF 55%. PA 33/6/19, PCWP 8.   GERD (gastroesophageal reflux disease)    H/O brain tumor 01/2024   History of echocardiogram    a. 01/2020 Echo: EF 60-65%, no rwma. Sev elev PASP. Mod dil LA.   Hyperlipidemia    Hypertension    Myocardial infarction [facility])    Paroxysmal atrial fibrillation (HCC)    a. s/p ablation 01/2013; b CHADS2VASc => 3 (HTN, DM, vascular disease); c. on Coumadin ; d. 12/2019 Amio d/c'd 2/2 hyperthyroidism.   Poorly controlled diabetes mellitus (HCC)    RBBB    Sleep apnea    uses nightly    Past Surgical History:  Procedure Laterality Date   ABLATION  01/29/2013   PVI and CTI by Dr Kelsie for atrial flutter and paroxysmal atrial fibrillation   ANTERIOR CERVICAL DECOMP/DISCECTOMY FUSION N/A 04/18/2018  Procedure: ANTERIOR CERVICAL DECOMPRESSION/DISCECTOMY FUSION 2 LEVELS C5-7;  Surgeon: Clois Fret, MD;  Location: [facility] ORS;  Service: Neurosurgery;  Laterality: N/A;   APPLICATION OF CRANIAL NAVIGATION Left 01/29/2024   Procedure: COMPUTER-ASSISTED NAVIGATION, FOR CRANIAL PROCEDURE;  Surgeon: Rosslyn Dino HERO, MD;  Location: [facility] OR;  Service: Neurosurgery;  Laterality: Left;   ATRIAL FIBRILLATION ABLATION N/A 01/29/2013   Procedure: ATRIAL FIBRILLATION  ABLATION;  Surgeon: Lynwood JONETTA Rakers, MD;  Location: [facility] CATH LAB;  Service: Cardiovascular;  Laterality: N/A;   ATRIAL FIBRILLATION ABLATION N/A 11/15/2021   Procedure: ATRIAL FIBRILLATION ABLATION;  Surgeon: Cindie Ole DASEN, MD;  Location: [facility] INVASIVE CV LAB;  Service: Cardiovascular;  Laterality: N/A;   CARDIAC CATHETERIZATION  08/03/2011   COLONOSCOPY WITH PROPOFOL  N/A 02/02/2016   Procedure: COLONOSCOPY WITH PROPOFOL ;  Surgeon: Rogelia Copping, MD;  Location: [facility] ENDOSCOPY;  Service: Endoscopy;  Laterality: N/A;   COLONOSCOPY WITH PROPOFOL  N/A 04/20/2023   Procedure: COLONOSCOPY WITH PROPOFOL ;  Surgeon: Copping Rogelia, MD;  Location: [facility] ENDOSCOPY;  Service: Endoscopy;  Laterality: N/A;   CRANIOTOMY Left 01/29/2024   Procedure: CRANIOTOMY TUMOR EXCISION;  Surgeon: Rosslyn Dino HERO, MD;  Location: [facility] OR;  Service: Neurosurgery;  Laterality: Left;  LEFT TEMPORAL MASS RESCETION   LUMBAR LAMINECTOMY/DECOMPRESSION MICRODISCECTOMY N/A 06/07/2017   Procedure: LUMBAR LAMINECTOMY/DECOMPRESSION MICRODISCECTOMY 2 LEVELS-L3-4,L4-5;  Surgeon: Clois Fret, MD;  Location: [facility] ORS;  Service: Neurosurgery;  Laterality: N/A;   POLYPECTOMY  04/20/2023   Procedure: POLYPECTOMY;  Surgeon: Copping Rogelia, MD;  Location: [facility] ENDOSCOPY;  Service: Endoscopy;;   RIGHT/LEFT HEART CATH AND CORONARY ANGIOGRAPHY N/A 03/06/2020   Procedure: RIGHT/LEFT HEART CATH AND CORONARY ANGIOGRAPHY;  Surgeon: Perla Evalene PARAS, MD;  Location: [facility] INVASIVE CV LAB;  Service: Cardiovascular;  Laterality: N/A;   TEE WITHOUT CARDIOVERSION N/A 01/28/2013   Procedure: TRANSESOPHAGEAL ECHOCARDIOGRAM (TEE);  Surgeon: Redell GORMAN Shallow, MD;  Location: [facility] ENDOSCOPY;  Service: Cardiovascular;  Laterality: N/A;     reports that he quit smoking about 16 years ago. His smoking use included cigarettes. He started smoking about 51 years ago. He has a 52.5 pack-year smoking history. He has never used smokeless tobacco. He reports that he does not currently  use alcohol. He reports that he does not use drugs.  No Known Allergies  Family History  Problem Relation Age of Onset   Stroke Mother    Heart disease Mother        pacemaker   Heart attack Father 34   Throat cancer Father        throat   Heart disease Paternal Grandfather        MI   Diabetes Son    Heart attack Maternal Uncle     Prior to Admission medications   Medication Sig Start Date End Date Taking? Authorizing Provider  acetaminophen  (TYLENOL ) 500 MG tablet Take 500 mg by mouth 4 (four) times daily as needed for mild pain or moderate pain.    [provider]  amLODipine  (NORVASC ) 10 MG tablet Take 1 tablet (10 mg total) by mouth daily. 02/01/24   Wilson, Tara N, PA-C  atorvastatin  (LIPITOR ) 80 MG tablet Take 80 mg by mouth at bedtime.    [provider]  baclofen  (LIORESAL ) 10 MG tablet Take 1 tablet (10 mg total) by mouth 2 (two) times daily as needed (neck pain, muscle spasms). 01/31/24 03/01/24  Wilson, Tara N, PA-C  benazepril  (LOTENSIN ) 40 MG tablet TAKE 1 TABLET BY MOUTH ONCE DAILY 02/03/23   Gollan, Timothy J, MD  butalbital-acetaminophen -caffeine (FIORICET) 50-325-40 MG tablet Take 2 tablets by mouth every 4 (four) hours as needed for headache (Moderate pain). 01/31/24   Wilson, Tara N, PA-C  cholecalciferol  (VITAMIN D3) 25 MCG (1000 UNIT) tablet Take 500 Units by mouth daily.    [provider]  Continuous Blood Gluc Sensor (FREESTYLE LIBRE 14 DAY SENSOR) MISC SMARTSIG:1 Kit(s) Topical Every 2 Weeks 03/29/20   [provider]  dexamethasone  (DECADRON ) 4 MG tablet Take 1 tablet (4 mg total) by mouth every 8 (eight) hours. Patient taking differently: Take 4 mg by mouth daily. 01/31/24 01/30/25  Wilson, Tara N, PA-C  dofetilide  (TIKOSYN ) 500 MCG capsule TAKE 1 CAPSULE BY MOUTH TWICE DAILY 09/22/23   Cindie Ole DASEN, MD  ezetimibe  (ZETIA ) 10 MG tablet TAKE 1 TABLET BY MOUTH ONCE DAILY 03/06/23   Gollan, Timothy J, MD  furosemide   (LASIX ) 20 MG tablet TAKE 1 TABLET BY MOUTH TWICE DAILY AS NEEDED 12/29/22   Gollan, Timothy J, MD  insulin  glargine (LANTUS ) 100 UNIT/ML injection Inject 50 Units into the skin 2 (two) times daily.    [provider]  insulin  lispro (HUMALOG ) 100 UNIT/ML KiwkPen Inject 16-22 Units into the skin 2 (two) times daily. Sliding Scale 12/13/17 05/27/28  [provider]  INSULIN  SYRINGE 1CC/29G 29G X 1/2 1 ML MISC USE AS DIRECTED 05/27/15   Daphane Rosella, NP  JARDIANCE 25 MG TABS tablet Take 12.5 mg by mouth daily.    [provider]  levETIRAcetam (KEPPRA) 500 MG tablet Take 1 tablet (500 mg total) by mouth 2 (two) times daily. 01/24/24 02/23/24  Levander Slate, MD  metoprolol  succinate (TOPROL -XL) 25 MG 24 hr tablet TAKE 1 TABLET BY MOUTH ONCE DAILY 02/06/24   Gollan, Timothy J, MD  nitroGLYCERIN  (NITROSTAT ) 0.4 MG SL tablet Place 1 tablet (0.4 mg total) under the tongue every 5 (five) minutes as needed for chest pain. 07/31/23   Gollan, Timothy J, MD  ONE TOUCH ULTRA TEST test strip TEST THREE TIMES A DAY AS DIRECTED. 02/04/15   Daphane Rosella, NP  OVER THE COUNTER MEDICATION Take 1 capsule by mouth 2 (two) times daily. Omega XL    [provider]  pantoprazole  (PROTONIX ) 40 MG tablet Take 1 tablet (40 mg total) by mouth daily. Stop taking if no longer on decadron . 02/01/24   Wilson, Tara N, PA-C  rivaroxaban  (XARELTO ) 20 MG TABS tablet Take 1 tablet (20 mg total) by mouth daily with supper. 02/08/24   Tanda Rexene SAILOR, PA-C    Physical Exam: Vitals:   02/09/24 1325 02/09/24 1446  BP: (!) 153/75 (!) 158/68  Pulse: 78 66  Resp: (!) 23 20  Temp: 97.8 F (36.6 C) 98.1 F (36.7 C)  TempSrc:  Oral  SpO2: 94% 97%  Height:  5' 11 (1.803 m)    Physical Exam Vitals reviewed.  Constitutional:      General: He is not in acute distress. HENT:     Head: Normocephalic and atraumatic.  Eyes:     Extraocular Movements: Extraocular movements intact.  Cardiovascular:     Rate  and Rhythm: Normal rate and regular rhythm.     Heart sounds: Normal heart sounds.  Pulmonary:     Effort: Pulmonary effort is normal. No respiratory distress.     Breath sounds: Normal breath sounds.  Abdominal:     General: Bowel sounds are normal.     Palpations: Abdomen is soft.     Tenderness: There is no abdominal tenderness. There is no  guarding.  Musculoskeletal:     Cervical back: Normal range of motion.     Right lower leg: No edema.     Comments: Left lower extremity: In Ace wrap.  Foot cool to touch and difficult to appreciate dorsalis pedis pulse on palpation.  Skin:    General: Skin is warm and dry.  Neurological:     General: No focal deficit present.     Mental Status: He is alert and oriented to person, place, and time.     Labs on Admission: I have personally reviewed following labs and imaging studies  CBC: Recent Labs  Lab 02/09/24 1347  WBC 18.0*  NEUTROABS 16.4*  HGB 14.6  HCT 44.5  MCV 93.5  PLT 260   Basic Metabolic Panel: Recent Labs  Lab 02/09/24 1347  NA 135  K 4.6  CL 106  CO2 18*  GLUCOSE 192*  BUN 30*  CREATININE 1.28*  CALCIUM  7.9*   GFR: Estimated Creatinine Clearance: 68.8 mL/min (A) (by C-G formula based on SCr of 1.28 mg/dL (H)). Liver Function Tests: No results for input(s): AST, ALT, ALKPHOS, BILITOT, PROT, ALBUMIN  in the last 168 hours. No results for input(s): LIPASE, AMYLASE in the last 168 hours. No results for input(s): AMMONIA in the last 168 hours. Coagulation Profile: No results for input(s): INR, PROTIME in the last 168 hours. Cardiac Enzymes: No results for input(s): CKTOTAL, CKMB, CKMBINDEX, TROPONINI in the last 168 hours. BNP (last 3 results) No results for input(s): PROBNP in the last 8760 hours. HbA1C: No results for input(s): HGBA1C in the last 72 hours. CBG: Recent Labs  Lab 02/09/24 1458 02/09/24 1715  GLUCAP 187* 147*   Lipid Profile: No results for input(s):  CHOL, HDL, LDLCALC, TRIG, CHOLHDL, LDLDIRECT in the last 72 hours. Thyroid  Function Tests: No results for input(s): TSH, T4TOTAL, FREET4, T3FREE, THYROIDAB in the last 72 hours. Anemia Panel: No results for input(s): VITAMINB12, FOLATE, FERRITIN, TIBC, IRON, RETICCTPCT in the last 72 hours. Urine analysis:    Component Value Date/Time   COLORURINE STRAW (A) 01/24/2024 1426   APPEARANCEUR CLEAR (A) 01/24/2024 1426   APPEARANCEUR Clear 05/29/2020 0818   LABSPEC 1.021 01/24/2024 1426   LABSPEC 1.005 03/02/2014 1416   PHURINE 6.0 01/24/2024 1426   GLUCOSEU >=500 (A) 01/24/2024 1426   GLUCOSEU 300 mg/dL 88/70/7984 8583   HGBUR NEGATIVE 01/24/2024 1426   BILIRUBINUR NEGATIVE 01/24/2024 1426   BILIRUBINUR Negative 05/29/2020 0818   BILIRUBINUR NEGATIVE 03/02/2014 1416   KETONESUR NEGATIVE 01/24/2024 1426   PROTEINUR NEGATIVE 01/24/2024 1426   NITRITE NEGATIVE 01/24/2024 1426   LEUKOCYTESUR NEGATIVE 01/24/2024 1426   LEUKOCYTESUR NEGATIVE 03/02/2014 1416    Radiological Exams on Admission: HYBRID OR IMAGING ([facility] ONLY) Result Date: 02/09/2024 There is no interpretation for this exam.  This order is for images obtained during a surgical procedure.  Please See Surgeries Tab for more information regarding the procedure.   VAS US  LOWER EXTREMITY VENOUS (DVT) Result Date: 02/09/2024  Lower Venous DVT Study Patient Name:  Douglas Hunter  Date of Exam:   02/09/2024 Medical Rec #: [id number]         Accession #:    [id number] Date of Birth: January 12, 1956         Patient Gender: M Patient Age:   65 years Exam Location:  [address] Procedure:      VAS US  LOWER EXTREMITY VENOUS (DVT) Referring Phys: DINO SABLE --------------------------------------------------------------------------------  Indications: Pain. Other Indications: Patient states left foot pain since brain  surgery a few weeks                    ago. Feels better with warm blanket. Left leg is cold with                     pallor. Performing Technologist: King Pierre RVT Supporting Technologist: Stoney Ross RVT  Examination Guidelines: A complete evaluation includes B-mode imaging, spectral Doppler, color Doppler, and power Doppler as needed of all accessible portions of each vessel. Bilateral testing is considered an integral part of a complete examination. Limited examinations for reoccurring indications may be performed as noted. The reflux portion of the exam is performed with the patient in reverse Trendelenburg.  +---------+---------------+---------+-----------+----------+--------------+ LEFT     CompressibilityPhasicitySpontaneityPropertiesThrombus Aging +---------+---------------+---------+-----------+----------+--------------+ CFV      Full           Yes      Yes                                 +---------+---------------+---------+-----------+----------+--------------+ SFJ      Full                    Yes                                 +---------+---------------+---------+-----------+----------+--------------+ FV Prox  Full           Yes      Yes                                 +---------+---------------+---------+-----------+----------+--------------+ FV Mid   Full           Yes      Yes                                 +---------+---------------+---------+-----------+----------+--------------+ FV DistalFull           Yes      Yes                                 +---------+---------------+---------+-----------+----------+--------------+ POP      Full           Yes      Yes                                 +---------+---------------+---------+-----------+----------+--------------+ PTV      Full                    Yes                                 +---------+---------------+---------+-----------+----------+--------------+ PERO     Full                    Yes                                  +---------+---------------+---------+-----------+----------+--------------+  Findings reported to Dr. Junjua via RN at 12:50.  Patient referred to ED.  Summary: LEFT: - There is no evidence of deep vein thrombosis in the lower extremity.  - Incidental finding of left femoral artery, popliteal artery, posterior tibial and peroneal artery occlusion. Dr. Sherl requested patient be sent to the ED.  *See table(s) above for measurements and observations. Electronically signed by Norman Serve on 02/09/2024 at 1:11:05 PM.    Final     Assessment and Plan  Acute limb ischemia of left lower extremity Patient presented with left leg pain, cool left foot with no palpable pulses.  He had an outpatient duplex ultrasound done today which showed arterial occlusion in the left femoral, popliteal, posterior tibial, and peroneal arteries.  Patient was taken to the OR by vascular surgery for emergent left lower extremity revascularization including thrombectomy and also required left lower extremity 4 compartment fasciotomies.  Informed by vascular surgery PA that unfortunately no good blood flow could be restored and patient had dead muscles in his compartments.  As such, he will require left AKA at some point.  Recommended continuing heparin  drip for now.  Continue pain management.  Vascular surgery consulting.  Leukocytosis In the setting of steroid use.  No fever, lactic acidosis, or signs of sepsis.  Trend WBC count.  Mild normal anion gap metabolic acidosis Gentle IV fluid hydration and monitor labs.  Hypocalcemia? Calcium  7.9 on BMP.  Check ionized calcium  level.  Left temporal glioblastoma status post craniotomy with resection of tumor on 01/29/2024 Patient had an office visit with oncology yesterday and plan is to start radiation and chemotherapy.  He will need outpatient oncology follow-up.  Continue levetiracetam 500 mg twice daily for seizure prophylaxis.  Seen by oncology yesterday and dexamethasone  dose  was decreased to 4 mg daily x 5 days, then 2 mg daily x 5 days, then stopped if tolerated.  Paroxysmal A-flutter/flutter Hold home Xarelto  as patient is currently on heparin  drip.  Continue metoprolol  and dofetilide .  CAD Patient is not endorsing chest pain.  Continue metoprolol  and statin.  Hypertension SBP in the 140s.  Continue metoprolol , amlodipine , and benazepril .  Hyperlipidemia Continue atorvastatin  and ezetimibe .  Insulin -dependent type 2 diabetes Hemoglobin A1c 8.0 on 01/30/2024.  Continue home long-acting insulin .  Placed on moderate sliding scale insulin  ACHS.  CKD stage 2-3a Creatinine stable, monitor labs.  OSA Continue nightly CPAP.  GERD Continue PPI.  DVT prophylaxis: IV heparin  gtt Code Status: Full Code (discussed with the patient) Level of care: Progressive Care Unit Admission status: It is my clinical opinion that admission to INPATIENT is reasonable and necessary because of the expectation that this patient will require hospital care that crosses at least 2 midnights to treat this condition based on the medical complexity of the problems presented.  Given the aforementioned information, the predictability of an adverse outcome is felt to be significant.  Editha Ram MD [facility]  If 7PM-7AM, please contact night-coverage [email or web address]  02/09/2024, 7:25 PM

## 2024-02-09 NOTE — ED Provider Triage Note (Signed)
 Emergency Medicine Provider Triage Evaluation Note  Douglas Hunter , a 68 y.o. male  was evaluated in triage.  Pt complains of arterial occlusion of the left leg.  Recent brain surgery.  Went in for DVT study today.  Confirmed extensive arterial occlusion on the left side.  No palpable pulse in the left foot  Review of Systems  Positive: Arterial occlusion Negative: Palpable pulse  Physical Exam  BP (!) 153/75 (BP Location: Left Arm)   Pulse 78   Temp 97.8 F (36.6 C)   Resp (!) 23   SpO2 94%  Gen:   Awake, no distress   Resp:  Normal effort  MSK:   Moves extremities without difficulty  Other:  Cool pale purple-colored left lower extremity does not seem to be in severe pain.  Delayed cap refill  Medical Decision Making  Medically screening exam initiated at 1:39 PM.  Appropriate orders placed.  Dalonte Hardage [place] was informed that the remainder of the evaluation will be completed by another provider, this initial triage assessment does not replace that evaluation, and the importance of remaining in the ED until their evaluation is complete.  Patient moved emergently to room 19. Case discussed with Dr. Magda who is on his way to see the patient, heparin  initiated   Arloa Chroman, PA-C 02/09/24 1500

## 2024-02-10 DIAGNOSIS — I998 Other disorder of circulatory system: Secondary | ICD-10-CM | POA: Diagnosis not present

## 2024-02-10 LAB — POCT I-STAT 7, (LYTES, BLD GAS, ICA,H+H)
Acid-base deficit: 6 mmol/L — ABNORMAL HIGH (ref 0.0–2.0)
Acid-base deficit: 7 mmol/L — ABNORMAL HIGH (ref 0.0–2.0)
Bicarbonate: 18.7 mmol/L — ABNORMAL LOW (ref 20.0–28.0)
Bicarbonate: 19.8 mmol/L — ABNORMAL LOW (ref 20.0–28.0)
Calcium, Ion: 1.15 mmol/L (ref 1.15–1.40)
Calcium, Ion: 1.19 mmol/L (ref 1.15–1.40)
HCT: 33 % — ABNORMAL LOW (ref 39.0–52.0)
HCT: 38 % — ABNORMAL LOW (ref 39.0–52.0)
Hemoglobin: 11.2 g/dL — ABNORMAL LOW (ref 13.0–17.0)
Hemoglobin: 12.9 g/dL — ABNORMAL LOW (ref 13.0–17.0)
O2 Saturation: 100 %
O2 Saturation: 99 %
Patient temperature: 36.1
Patient temperature: 36.9
Potassium: 4.6 mmol/L (ref 3.5–5.1)
Potassium: 4.6 mmol/L (ref 3.5–5.1)
Sodium: 137 mmol/L (ref 135–145)
Sodium: 137 mmol/L (ref 135–145)
TCO2: 20 mmol/L — ABNORMAL LOW (ref 22–32)
TCO2: 21 mmol/L — ABNORMAL LOW (ref 22–32)
pCO2 arterial: 34.5 mmHg (ref 32–48)
pCO2 arterial: 39.9 mmHg (ref 32–48)
pH, Arterial: 7.304 — ABNORMAL LOW (ref 7.35–7.45)
pH, Arterial: 7.337 — ABNORMAL LOW (ref 7.35–7.45)
pO2, Arterial: 140 mmHg — ABNORMAL HIGH (ref 83–108)
pO2, Arterial: 215 mmHg — ABNORMAL HIGH (ref 83–108)

## 2024-02-10 LAB — COMPREHENSIVE METABOLIC PANEL WITH GFR
ALT: 25 U/L (ref 0–44)
AST: 17 U/L (ref 15–41)
Albumin: 2.9 g/dL — ABNORMAL LOW (ref 3.5–5.0)
Alkaline Phosphatase: 76 U/L (ref 38–126)
Anion gap: 13 (ref 5–15)
BUN: 31 mg/dL — ABNORMAL HIGH (ref 8–23)
CO2: 16 mmol/L — ABNORMAL LOW (ref 22–32)
Calcium: 8.4 mg/dL — ABNORMAL LOW (ref 8.9–10.3)
Chloride: 104 mmol/L (ref 98–111)
Creatinine, Ser: 1.47 mg/dL — ABNORMAL HIGH (ref 0.61–1.24)
GFR, Estimated: 51 mL/min — ABNORMAL LOW (ref >60)
Glucose, Bld: 233 mg/dL — ABNORMAL HIGH (ref 70–99)
Potassium: 5 mmol/L (ref 3.5–5.1)
Sodium: 133 mmol/L — ABNORMAL LOW (ref 135–145)
Total Bilirubin: 1.5 mg/dL — ABNORMAL HIGH (ref 0.0–1.2)
Total Protein: 5.5 g/dL — ABNORMAL LOW (ref 6.5–8.1)

## 2024-02-10 LAB — POCT ACTIVATED CLOTTING TIME
Activated Clotting Time: 268 s
Activated Clotting Time: 227 s
Activated Clotting Time: 239 s

## 2024-02-10 LAB — CBC
HCT: 33.8 % — ABNORMAL LOW (ref 39.0–52.0)
Hemoglobin: 11.4 g/dL — ABNORMAL LOW (ref 13.0–17.0)
MCH: 31.2 pg (ref 26.0–34.0)
MCHC: 33.7 g/dL (ref 30.0–36.0)
MCV: 92.6 fL (ref 80.0–100.0)
Platelets: 227 K/uL (ref 150–400)
RBC: 3.65 MIL/uL — ABNORMAL LOW (ref 4.22–5.81)
RDW: 13.1 % (ref 11.5–15.5)
WBC: 21.8 K/uL — ABNORMAL HIGH (ref 4.0–10.5)
nRBC: 0 % (ref 0.0–0.2)

## 2024-02-10 LAB — GLUCOSE, CAPILLARY: Glucose-Capillary: 172 mg/dL — ABNORMAL HIGH (ref 70–99)

## 2024-02-10 LAB — PROTIME-INR
INR: 1.1 (ref 0.8–1.2)
Prothrombin Time: 14.8 s (ref 11.4–15.2)

## 2024-02-10 LAB — HIV ANTIBODY (ROUTINE TESTING W REFLEX): HIV Screen 4th Generation wRfx: NONREACTIVE

## 2024-02-10 LAB — APTT: aPTT: 31 s (ref 24–36)

## 2024-02-10 MED ORDER — INSULIN GLARGINE-YFGN 100 UNIT/ML ~~LOC~~ SOLN
50.0000 [IU] | Freq: Two times a day (BID) | SUBCUTANEOUS | Status: DC
  Administered 2024-02-10 – 2024-02-13 (×7): 50 [IU] via SUBCUTANEOUS
  Filled 2024-02-10 (×8): qty 0.5

## 2024-02-10 NOTE — Progress Notes (Addendum)
 Progress Note    02/10/2024 8:22 AM 1 Day Post-Op  Subjective:  says his left leg is feeling better, denies any pain    Vitals:   02/10/24 0331 02/10/24 0722  BP: 137/69 130/66  Pulse: 75 75  Resp: 20 (!) 22  Temp: 97.6 F (36.4 C) 97.7 F (36.5 C)  SpO2: 100% 98%    Physical Exam: General:  resting comfortably in bed, NAD Cardiac:  regular Lungs:  nonlabored Incisions:  L groin incision intact and dry with staples. Some hematoma present however this is soft. LLE incisions bandaged Extremities:  left foot is warm to touch. Improved sensation and motor including dorsiflexion/plantarflexion. Cannot wiggle the toes. + left peroneal signal above the ankle   CBC    Component Value Date/Time   WBC 21.8 (H) 02/10/2024 0600   RBC 3.65 (L) 02/10/2024 0600   HGB 11.4 (L) 02/10/2024 0600   HGB 13.8 06/28/2023 0946   HCT 33.8 (L) 02/10/2024 0600   HCT 41.7 06/28/2023 0946   PLT 227 02/10/2024 0600   PLT 232 06/28/2023 0946   MCV 92.6 02/10/2024 0600   MCV 93 06/28/2023 0946   MCV 95 03/02/2014 1416   MCH 31.2 02/10/2024 0600   MCHC 33.7 02/10/2024 0600   RDW 13.1 02/10/2024 0600   RDW 12.3 06/28/2023 0946   RDW 12.8 03/02/2014 1416   LYMPHSABS 0.9 02/09/2024 1347   LYMPHSABS 2.0 10/26/2016 1423   LYMPHSABS 1.5 03/02/2014 1416   MONOABS 0.6 02/09/2024 1347   MONOABS 1.3 (H) 03/02/2014 1416   EOSABS 0.0 02/09/2024 1347   EOSABS 0.3 10/26/2016 1423   EOSABS 0.3 03/02/2014 1416   BASOSABS 0.0 02/09/2024 1347   BASOSABS 0.0 10/26/2016 1423   BASOSABS 0.1 03/02/2014 1416    BMET    Component Value Date/Time   NA 133 (L) 02/10/2024 0600   NA 140 06/28/2023 0946   NA 131 (L) 03/02/2014 1416   K 5.0 02/10/2024 0600   K 4.1 03/02/2014 1416   CL 104 02/10/2024 0600   CL 98 03/02/2014 1416   CO2 16 (L) 02/10/2024 0600   CO2 24 03/02/2014 1416   GLUCOSE 233 (H) 02/10/2024 0600   GLUCOSE 374 (H) 03/02/2014 1416   BUN 31 (H) 02/10/2024 0600   BUN 26 06/28/2023 0946    BUN 18 03/02/2014 1416   CREATININE 1.47 (H) 02/10/2024 0600   CREATININE 1.27 03/02/2014 1416   CALCIUM  8.4 (L) 02/10/2024 0600   CALCIUM  8.7 03/02/2014 1416   GFRNONAA 51 (L) 02/10/2024 0600   GFRNONAA >60 03/02/2014 1416   GFRNONAA >60 01/12/2013 0013   GFRAA 53 (L) 02/20/2020 0925   GFRAA >60 03/02/2014 1416   GFRAA >60 01/12/2013 0013    INR    Component Value Date/Time   INR 1.1 02/10/2024 0600   INR 1.1 01/11/2013 1615     Intake/Output Summary (Last 24 hours) at 02/10/2024 9177 Last data filed at 02/10/2024 9367 Gross per 24 hour  Intake 2160 ml  Output 2750 ml  Net -590 ml      Assessment/Plan:  68 y.o. male is 1 day post op, s/p: Left common femoral thromboendarterectomy with bovine pericardial patch angioplasty, left lower extremity thrombectomy, left PT thrombectomy, left lower extremity 4 compartment fasciotomies, LLE angio   - Overnight the patient had 2 events of swelling in the left groin with concern for expanding hematoma.  Bleeding was controlled with 30 minutes of pressure and pausing heparin  drip -This morning his left groin incision  is intact and dry.  He does have some hematoma present, however this is soft -Left lower extremity incisions are bandaged with mild strikethrough bleeding -Improved exam of the left foot.  Left foot is warm to touch with a peroneal signal at the ankle.  Improved motor of the left foot. No DP/PT doppler signals in the left foot -Hemoglobin stable at 11.4 -Okay to mobilize today.  Will continue to hold heparin  drip and will reconsider resuming tomorrow if groin is stable.  Okay to DC Foley.  Keep A-line in for at least 1 more day   Ahmed Holster, [place] [facility] [phone] 02/10/2024 8:22 AM   VASCULAR STAFF ADDENDUM: I have independently interviewed and examined the patient. I agree with the above.   Debby SAILOR. Magda, MD [facility] Phone Number:  [phone] 02/10/2024 10:02 AM

## 2024-02-10 NOTE — Progress Notes (Signed)
 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  His Xarelto  recently was held in the setting of brain surgery and was restarted yesterday.  Patient had a follow-up visit with neurosurgery  and had complained of left leg pain.  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  He was sent to the ED for further evaluation. Patient was evaluated by vascular surgery and started on IV heparin . He was taken to the OR for emergent left lower extremity revascularization including thrombectomy and also required left lower extremity 4 compartment fasciotomies. After surgery Patient was admitted under hospitalist's service for further management.Informed by vascular surgery PA that unfortunately no good blood flow could be restored and patient had dead muscles in his compartments. As such, he will require left AKA at some point. Recommended continuing heparin  drip for now.   Assessment & Plan:   Principal Problem:   Acute lower limb ischemia Active Problems:   HTN (hypertension)   HLD (hyperlipidemia)   Paroxysmal atrial fibrillation (HCC)   Leukocytosis   Acute Left lower extremity / Limb Ischemia: Patient presented with left leg pain, cool left foot with no palpable pulses.   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  Patient was taken to the OR by vascular surgery for  emergent left lower extremity revascularization including thrombectomy and also required left lower extremity 4 compartment fasciotomies.  Informed by vascular surgery PA that unfortunately no good blood flow could be restored and patient had dead muscles in his compartments.  As such, he will require left AKA at some point.  Recommended continuing heparin  drip for now.  Continue pain management.  Vascular surgery following Heparin  is stopped due to small hematoma and left groin and left ankle area, Bleeding stopped with 30 mins of pressure and pausing Heparin . Plan is to mobilize, hold heparin  for today and reconsider resuming tomorrow if groin is stable.   Leukocytosis; Likely In the setting of steroid use.  No fever, lactic acidosis, or signs of sepsis.  Trend WBC count.   Mild normal anion gap metabolic acidosis: Continue IV fluid hydration and monitor labs.   Hypocalcemia; Calcium  7.9 on BMP.  Check ionized calcium  level.   Left temporal glioblastoma status post craniotomy with resection of tumor on 01/29/2024 Patient had an office visit with oncology yesterday and plan is to start radiation and chemotherapy.   He will need outpatient oncology follow-up.  Continue levetiracetam 500 mg twice daily for seizure prophylaxis.  Seen by oncology yesterday and dexamethasone  dose was decreased to 4 mg daily x 5 days, then 2 mg daily x 5 days, then stopped if tolerated.   Paroxysmal A-flutter/flutter: Hold home Xarelto  as patient is currently on heparin  drip.   Continue metoprolol  and dofetilide . HR controlled.   CAD: Patient is not endorsing chest pain.   Continue metoprolol  and statin.   Hypertension: Continue metoprolol , amlodipine , and benazepril .   Hyperlipidemia; Continue atorvastatin  and ezetimibe .   Insulin -dependent type 2 diabetes  Hemoglobin A1c 8.0 on 01/30/2024.   Continue home long-acting insulin .   Placed on moderate sliding scale insulin  ACHS.   CKD stage  2-3a: Creatinine stable, monitor labs.   OSA: Continue nightly CPAP.   GERD: Continue PPI.   DVT prophylaxis: Heparin  IV Code Status: Full code Family Communication: Wife at bed side Disposition Plan:    Status is: Inpatient Remains inpatient appropriate because: Severity of illness.   Consultants:  Vascular Surgery  Procedures: Thromboectomy , Fasciotomies.  Antimicrobials:  Anti-infectives (From admission, onward)    Start     Dose/Rate Route Frequency Ordered Stop   02/09/24 1525  ceFAZolin  (ANCEF ) 2-4 GM/100ML-% IVPB       Note to Pharmacy: Effie Rily O: cabinet override      02/09/24 1525 02/10/24 0329      Subjective: Patient was seen and examined at bedside. Overnight events noted. Heparin  has been stopped due to small hematoma at the left ankle and left groin area. Patient reports pain is manageable.  Objective: Vitals:   02/09/24 2347 02/10/24 0045 02/10/24 0331 02/10/24 0722  BP: 117/72 127/70 137/69 130/66  Pulse: 73 79 75 75  Resp: 20 (!) 24 20 (!) 22  Temp: 97.6 F (36.4 C)  97.6 F (36.4 C) 97.7 F (36.5 C)  TempSrc: Oral  Oral Oral  SpO2: 100% 98% 100% 98%  Height:        Intake/Output Summary (Last 24 hours) at 02/10/2024 1059 Last data filed at 02/10/2024 9367 Gross per 24 hour  Intake 2160 ml  Output 2750 ml  Net -590 ml   There were no vitals filed for this visit.  Examination:  General exam: Appears calm and comfortable, not in any acute distress.  Respiratory system: CTA Bilaterally. Respiratory effort normal. RR 17 Cardiovascular system: S1 & S2 heard, RRR. No JVD, murmurs, rubs, gallops or clicks.  Gastrointestinal system: Abdomen is non distended, soft and non tender. Normal bowel sounds heard. Central nervous system: Alert and oriented x 3. No focal neurological deficits. Extremities:  Left ankle and groin hematoma, improving Skin: No rashes, lesions or ulcers Psychiatry: Judgement and insight appear normal. Mood &  affect appropriate.   Data Reviewed: I have personally reviewed following labs and imaging studies  CBC: Recent Labs  Lab 02/09/24 1347 02/09/24 1831 02/09/24 1903 02/10/24 0600  WBC 18.0*  --   --  21.8*  NEUTROABS 16.4*  --   --   --   HGB 14.6 12.9* 11.2* 11.4*  HCT 44.5 38.0* 33.0* 33.8*  MCV 93.5  --   --  92.6  PLT 260  --   --  227   Basic Metabolic Panel: Recent Labs  Lab 02/09/24 1347 02/09/24 1831 02/09/24 1903 02/10/24 0600  NA 135 137 137 133*  K 4.6 4.6 4.6 5.0  CL 106  --   --  104  CO2 18*  --   --  16*  GLUCOSE 192*  --   --  233*  BUN 30*  --   --  31*  CREATININE 1.28*  --   --  1.47*  CALCIUM  7.9*  --   --  8.4*   GFR: Estimated Creatinine Clearance: 59.9 mL/min (A) (by C-G formula based on SCr of 1.47 mg/dL (H)). Liver Function Tests: Recent Labs  Lab 02/10/24 0600  AST 17  ALT 25  ALKPHOS 76  BILITOT 1.5*  PROT 5.5*  ALBUMIN  2.9*   No results for input(s): LIPASE, AMYLASE in the last 168  hours. No results for input(s): AMMONIA in the last 168 hours. Coagulation Profile: Recent Labs  Lab 02/10/24 0600  INR 1.1   Cardiac Enzymes: No results for input(s): CKTOTAL, CKMB, CKMBINDEX, TROPONINI in the last 168 hours. BNP (last 3 results) No results for input(s): PROBNP in the last 8760 hours. HbA1C: No results for input(s): HGBA1C in the last 72 hours. CBG: Recent Labs  Lab 02/09/24 1458 02/09/24 1715 02/09/24 1951 02/10/24 0002  GLUCAP 187* 147* 138* 172*   Lipid Profile: No results for input(s): CHOL, HDL, LDLCALC, TRIG, CHOLHDL, LDLDIRECT in the last 72 hours. Thyroid  Function Tests: No results for input(s): TSH, T4TOTAL, FREET4, T3FREE, THYROIDAB in the last 72 hours. Anemia Panel: No results for input(s): VITAMINB12, FOLATE, FERRITIN, TIBC, IRON, RETICCTPCT in the last 72 hours. Sepsis Labs: Recent Labs  Lab 02/09/24 1420  LATICACIDVEN 1.1    No results found for  this or any previous visit (from the past 240 hours).   Radiology Studies: HYBRID OR IMAGING ([facility] ONLY) Result Date: 02/09/2024 There is no interpretation for this exam.  This order is for images obtained during a surgical procedure.  Please See Surgeries Tab for more information regarding the procedure.   VAS US  LOWER EXTREMITY VENOUS (DVT) Result Date: 02/09/2024  Lower Venous DVT Study Patient Name:  FERDINAND REVOIR  Date of Exam:   02/09/2024 Medical Rec #: [id number]         Accession #:    [id number] Date of Birth: [birth year]         Patient Gender: M Patient Age:   50 years Exam Location:  [address] Procedure:      VAS US  LOWER EXTREMITY VENOUS (DVT) Referring Phys: DINO SABLE --------------------------------------------------------------------------------  Indications: Pain. Other Indications: Patient states left foot pain since brain surgery a few weeks                    ago. Feels better with warm blanket. Left leg is cold with                    pallor. Performing Technologist: [place] RVT Supporting Technologist: Stoney Ross RVT  Examination Guidelines: A complete evaluation includes B-mode imaging, spectral Doppler, color Doppler, and power Doppler as needed of all accessible portions of each vessel. Bilateral testing is considered an integral part of a complete examination. Limited examinations for reoccurring indications may be performed as noted. The reflux portion of the exam is performed with the patient in reverse Trendelenburg.  +---------+---------------+---------+-----------+----------+--------------+ LEFT     CompressibilityPhasicitySpontaneityPropertiesThrombus Aging +---------+---------------+---------+-----------+----------+--------------+ CFV      Full           Yes      Yes                                 +---------+---------------+---------+-----------+----------+--------------+ SFJ      Full                    Yes                                  +---------+---------------+---------+-----------+----------+--------------+ FV Prox  Full           Yes      Yes                                 +---------+---------------+---------+-----------+----------+--------------+  FV Mid   Full           Yes      Yes                                 +---------+---------------+---------+-----------+----------+--------------+ FV DistalFull           Yes      Yes                                 +---------+---------------+---------+-----------+----------+--------------+ POP      Full           Yes      Yes                                 +---------+---------------+---------+-----------+----------+--------------+ PTV      Full                    Yes                                 +---------+---------------+---------+-----------+----------+--------------+ PERO     Full                    Yes                                 +---------+---------------+---------+-----------+----------+--------------+  Findings reported to Dr. Junjua via RN at 12:50. Patient referred to ED.  Summary: LEFT: - There is no evidence of deep vein thrombosis in the lower extremity.  - Incidental finding of left femoral artery, popliteal artery, posterior tibial and peroneal artery occlusion. Dr. Sherl requested patient be sent to the ED.  *See table(s) above for measurements and observations. Electronically signed by Norman Serve on 02/09/2024 at 1:11:05 PM.    Final    Scheduled Meds:  amLODipine   10 mg Oral Daily   atorvastatin   80 mg Oral QHS   benazepril   40 mg Oral Daily   [START ON 02/14/2024] dexamethasone   2 mg Oral Daily   dexamethasone   4 mg Oral Daily   dofetilide   500 mcg Oral BID   ezetimibe   10 mg Oral Daily   insulin  aspart  0-15 Units Subcutaneous TID WC   insulin  aspart  0-5 Units Subcutaneous QHS   insulin  glargine-yfgn  50 Units Subcutaneous BID   levETIRAcetam  500 mg Oral BID   metoprolol  succinate  25 mg Oral Daily    pantoprazole   40 mg Oral Daily   Continuous Infusions:   LOS: 1 day    Time spent: 50 Mins    Darcel Dawley, MD [facility]   If 7PM-7AM, please contact night-coverage

## 2024-02-10 NOTE — Progress Notes (Signed)
 Pt wife stated he has not used his CPAP in 2 weeks. Nurse is aware of refusal but cpap will be on standby

## 2024-02-10 NOTE — Progress Notes (Signed)
(  L) groin edema, firmness, and bleeding noted along with bleeding from (L) LE fasciotomy.  Pressure applied to groin, on call PA McKenzi Schuh notified.  Per PA Heparin  IV stopped and Dr. Emi notified .  Once Dr. Magda arrived he stated to keep pt off Heparin  for tonight and further intervention to be decided tomorrow.  Dressing change to (L) LE performed by Dr. Magda.  MD also aware of dorsalis pedis & posterior tibial pulses that aren't detected with a doppler.  Popliteal pulse found with doppler.

## 2024-02-10 NOTE — Progress Notes (Signed)
 Reoccurrence of (L) groin hematoma noted, pressure held for 30 minutes. MD came to bedside to assess.  No new orders.

## 2024-02-10 NOTE — Plan of Care (Signed)
  Problem: Clinical Measurements: Goal: Will remain free from infection Outcome: Progressing   Problem: Nutrition: Goal: Adequate nutrition will be maintained Outcome: Progressing   Problem: Pain Managment: Goal: General experience of comfort will improve and/or be controlled Outcome: Progressing   Problem: Metabolic: Goal: Ability to maintain appropriate glucose levels will improve Outcome: Progressing

## 2024-02-11 DIAGNOSIS — I998 Other disorder of circulatory system: Secondary | ICD-10-CM | POA: Diagnosis not present

## 2024-02-11 LAB — PHOSPHORUS: Phosphorus: 3.9 mg/dL (ref 2.5–4.6)

## 2024-02-11 LAB — CBC
HCT: 32.1 % — ABNORMAL LOW (ref 39.0–52.0)
Hemoglobin: 10.7 g/dL — ABNORMAL LOW (ref 13.0–17.0)
MCH: 31.3 pg (ref 26.0–34.0)
MCHC: 33.3 g/dL (ref 30.0–36.0)
MCV: 93.9 fL (ref 80.0–100.0)
Platelets: 209 K/uL (ref 150–400)
RBC: 3.42 MIL/uL — ABNORMAL LOW (ref 4.22–5.81)
RDW: 13.2 % (ref 11.5–15.5)
WBC: 17.7 K/uL — ABNORMAL HIGH (ref 4.0–10.5)
nRBC: 0 % (ref 0.0–0.2)

## 2024-02-11 LAB — APTT: aPTT: 32 s (ref 24–36)

## 2024-02-11 LAB — BASIC METABOLIC PANEL WITH GFR
Anion gap: 10 (ref 5–15)
BUN: 42 mg/dL — ABNORMAL HIGH (ref 8–23)
CO2: 20 mmol/L — ABNORMAL LOW (ref 22–32)
Calcium: 8.6 mg/dL — ABNORMAL LOW (ref 8.9–10.3)
Chloride: 105 mmol/L (ref 98–111)
Creatinine, Ser: 1.52 mg/dL — ABNORMAL HIGH (ref 0.61–1.24)
GFR, Estimated: 49 mL/min — ABNORMAL LOW (ref >60)
Glucose, Bld: 229 mg/dL — ABNORMAL HIGH (ref 70–99)
Potassium: 4.8 mmol/L (ref 3.5–5.1)
Sodium: 135 mmol/L (ref 135–145)

## 2024-02-11 LAB — HEPARIN LEVEL (UNFRACTIONATED): Heparin Unfractionated: 0.37 [IU]/mL (ref 0.30–0.70)

## 2024-02-11 LAB — MAGNESIUM: Magnesium: 2.1 mg/dL (ref 1.7–2.4)

## 2024-02-11 MED ORDER — HEPARIN (PORCINE) 25000 UT/250ML-% IV SOLN
1650.0000 [IU]/h | INTRAVENOUS | Status: DC
  Administered 2024-02-11: 1300 [IU]/h via INTRAVENOUS
  Administered 2024-02-12: 1650 [IU]/h via INTRAVENOUS
  Filled 2024-02-11 (×2): qty 250

## 2024-02-11 NOTE — Progress Notes (Addendum)
 PHARMACY - ANTICOAGULATION CONSULT NOTE  Pharmacy Consult for heparin  Indication: Ischemic limb, arterial occulsion  No Known Allergies  Patient Measurements: Height: 5' 11 (180.3 cm) IBW/kg (Calculated) : 75.3  Vital Signs: Temp: 97.7 F (36.5 C) (11/09 0815) Temp Source: Oral (11/09 0815) BP: 126/66 (11/09 0815) Pulse Rate: 62 (11/09 0815)  Labs: Recent Labs    02/09/24 1347 02/09/24 1408 02/09/24 1831 02/09/24 1903 02/10/24 0600 02/11/24 0259  HGB 14.6  --    < > 11.2* 11.4* 10.7*  HCT 44.5  --    < > 33.0* 33.8* 32.1*  PLT 260  --   --   --  227 209  APTT  --   --   --   --  31  --   LABPROT  --   --   --   --  14.8  --   INR  --   --   --   --  1.1  --   HEPARINUNFRC  --  >1.10*  --   --   --   --   CREATININE 1.28*  --   --   --  1.47* 1.52*   < > = values in this interval not displayed.    Estimated Creatinine Clearance: 57.9 mL/min (A) (by C-G formula based on SCr of 1.52 mg/dL (H)).   Assessment: 2 YOM presenting with foot pain, recent 10/27 crani with tumor resection, hx of afib on Xarelto  PTA which was held d/t the tumor resection and he was instructed to start back on it 11/6 which his last dose was taken at 1900.  Now here with ischemic limb and arterial occlusion and pharmacy consulted to dose heparin .  No bolus d/t recent tumor resection and proximity to Xarelto  dose.  Heparin  was started in the ED, then turned off for angioplasty, thrombectomy and fasciotomy.  Heparin  was then resumed while patient was in the PACU.  RN reports mild oozing.  11/8 heparin  was stopped due to a small hematoma.   11/9 consulted to start heparin  with no bolus for left leg arterial thrombus. Will start bolus at slightly lower dose due to recent hematoma. Hgb 10.7, PLT WNL.  Goal of Therapy:  Heparin  level 0.3-0.7 units/ml aPTT 66-102 seconds Monitor platelets by anticoagulation protocol: Yes   Plan:  Start heparin  gtt at 1300 units/hr, no bolus Check 6 hr aPTT and  heparin  level Monitor closely for bleeding  Prentice DOROTHA Favors, PharmD PGY1 [facility] Pharmacy Administration and Leadership Resident [facility]  02/11/2024 11:36 AM

## 2024-02-11 NOTE — Progress Notes (Signed)
 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  His Xarelto  recently was held in the setting of brain surgery and was restarted yesterday.  Patient had a follow-up visit with neurosurgery  and had complained of left leg pain.  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  He was sent to the ED for further evaluation. Patient was evaluated by vascular surgery and started on IV heparin . He was taken to the OR for emergent left lower extremity revascularization including thrombectomy and also required left lower extremity 4 compartment fasciotomies. After surgery Patient was admitted under hospitalist's service for further management.Informed by vascular surgery PA that unfortunately no good blood flow could be restored and patient had dead muscles in his compartments. As such, he will require left AKA at some point. Recommended continuing heparin  drip for now.   Assessment & Plan:   Principal Problem:   Acute lower limb ischemia Active Problems:   HTN (hypertension)   HLD (hyperlipidemia)   Paroxysmal atrial fibrillation (HCC)   Leukocytosis   Acute Left lower extremity / Limb Ischemia: Patient presented with left leg pain, cool left foot with no palpable pulses.   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  Patient was taken to the OR by vascular surgery for emergent  left lower extremity revascularization including thrombectomy and also required left lower extremity 4 compartment fasciotomies.  Informed by vascular surgery PA that unfortunately no good blood flow could be restored and patient had dead muscles in his compartments.  As such, he will require left AKA at some point.  Recommended continuing heparin  drip for now.  Continue pain management.  Vascular surgery following Heparin  was stopped due to small hematoma in left groin and left ankle area, Bleeding stopped with 30 mins of pressure and pausing Heparin . Plan is to mobilize, held heparin  for one day.  Left groin hematoma improved.  Resumed heparin  today. There is no indication for left lower extremity amputation at this time given that he is medically stable and no pain in the left foot.   Leukocytosis; Likely In the setting of steroid use.   No fever, lactic acidosis, or signs of sepsis.  Trend WBC count.   Mild normal anion gap metabolic acidosis: Continue IV fluid hydration and monitor labs.   Hypocalcemia; Calcium  7.9 on BMP.  Check ionized calcium  level.   Left temporal glioblastoma status post craniotomy with resection of tumor on 01/29/2024 Patient had an office visit with oncology yesterday and plan is to start radiation and chemotherapy.   He will need outpatient oncology follow-up.  Continue levetiracetam 500 mg twice daily for seizure prophylaxis.  Seen by oncology yesterday and dexamethasone  dose was decreased to 4 mg daily x 5 days, then 2 mg daily x 5 days, then stopped if tolerated.   Paroxysmal A-flutter/flutter: Hold home Xarelto  as patient is currently on heparin  drip.   Continue metoprolol  and dofetilide . HR controlled.   CAD: Patient is not endorsing chest pain.  Continue metoprolol  and statin.   Hypertension: Continue metoprolol , amlodipine , and benazepril .   Hyperlipidemia; Continue atorvastatin  and ezetimibe .   Insulin -dependent type 2 diabetes Hemoglobin A1c  8.0 on 01/30/2024.   Continue home long-acting insulin .   Placed on moderate sliding scale insulin  ACHS.   CKD stage 2-3a: Creatinine stable, monitor labs.   OSA: Continue nightly CPAP.   GERD: Continue PPI.   DVT prophylaxis: Heparin  IV Code Status: Full code Family Communication: Wife at bed side Disposition Plan:    Status is: Inpatient Remains inpatient appropriate because: Severity of illness.   Consultants:  Vascular Surgery  Procedures: Thromboectomy , Fasciotomies.  Antimicrobials:  Anti-infectives (From admission, onward)    Start     Dose/Rate Route Frequency Ordered Stop   02/09/24 1525  ceFAZolin  (ANCEF ) 2-4 GM/100ML-% IVPB       Note to Pharmacy: Effie Rily O: cabinet override      02/09/24 1525 02/10/24 0329      Subjective: Patient was seen and examined at bedside.Overnight events noted. Heparin  has been stopped due to small hematoma at the left ankle and left groin area. Patient reports pain is manageable.  Hematoma has improved.  Patient has no pain in the left ankle and groin  Objective: Vitals:   02/10/24 1946 02/11/24 0000 02/11/24 0359 02/11/24 0815  BP: 126/67 120/60 131/60 126/66  Pulse: 64   62  Resp: (!) 25   13  Temp: 97.8 F (36.6 C) 97.6 F (36.4 C) 97.9 F (36.6 C) 97.7 F (36.5 C)  TempSrc: Oral Oral Oral Oral  SpO2: 98%   99%  Height:        Intake/Output Summary (Last 24 hours) at 02/11/2024 1111 Last data filed at 02/10/2024 2148 Gross per 24 hour  Intake 480 ml  Output 1050 ml  Net -570 ml   There were no vitals filed for this visit.  Examination:  General exam: Appears calm and comfortable, not in any acute distress.  Respiratory system: CTA Bilaterally. Respiratory effort normal. RR 14 Cardiovascular system: S1 & S2 heard, RRR. No JVD, murmurs, rubs, gallops or clicks.  Gastrointestinal system: Abdomen is non distended, soft and non tender. Normal bowel sounds heard. Central nervous system: Alert and  oriented x 3. No focal neurological deficits. Extremities:  Left ankle and groin hematoma, improving Skin: No rashes, lesions or ulcers Psychiatry: Judgement and insight appear normal. Mood & affect appropriate.   Data Reviewed: I have personally reviewed following labs and imaging studies  CBC: Recent Labs  Lab 02/09/24 1347 02/09/24 1831 02/09/24 1903 02/10/24 0600 02/11/24 0259  WBC 18.0*  --   --  21.8* 17.7*  NEUTROABS 16.4*  --   --   --   --   HGB 14.6 12.9* 11.2* 11.4* 10.7*  HCT 44.5 38.0* 33.0* 33.8* 32.1*  MCV 93.5  --   --  92.6 93.9  PLT 260  --   --  227 209   Basic Metabolic Panel: Recent Labs  Lab 02/09/24 1347 02/09/24 1831 02/09/24 1903 02/10/24 0600 02/11/24 0259  NA 135 137 137 133* 135  K 4.6 4.6 4.6 5.0 4.8  CL 106  --   --  104 105  CO2 18*  --   --  16* 20*  GLUCOSE 192*  --   --  233* 229*  BUN 30*  --   --  31* 42*  CREATININE 1.28*  --   --  1.47* 1.52*  CALCIUM  7.9*  --   --  8.4* 8.6*  MG  --   --   --   --  2.1  PHOS  --   --   --   --  3.9   GFR: Estimated Creatinine Clearance: 57.9 mL/min (A) (by C-G formula based on SCr of 1.52 mg/dL (H)). Liver Function Tests: Recent Labs  Lab 02/10/24 0600  AST 17  ALT 25  ALKPHOS 76  BILITOT 1.5*  PROT 5.5*  ALBUMIN  2.9*   No results for input(s): LIPASE, AMYLASE in the last 168 hours. No results for input(s): AMMONIA in the last 168 hours. Coagulation Profile: Recent Labs  Lab 02/10/24 0600  INR 1.1   Cardiac Enzymes: No results for input(s): CKTOTAL, CKMB, CKMBINDEX, TROPONINI in the last 168 hours. BNP (last 3 results) No results for input(s): PROBNP in the last 8760 hours. HbA1C: No results for input(s): HGBA1C in the last 72 hours. CBG: Recent Labs  Lab 02/09/24 1458 02/09/24 1715 02/09/24 1951 02/10/24 0002  GLUCAP 187* 147* 138* 172*   Lipid Profile: No results for input(s): CHOL, HDL, LDLCALC, TRIG, CHOLHDL, LDLDIRECT in the last 72  hours. Thyroid  Function Tests: No results for input(s): TSH, T4TOTAL, FREET4, T3FREE, THYROIDAB in the last 72 hours. Anemia Panel: No results for input(s): VITAMINB12, FOLATE, FERRITIN, TIBC, IRON, RETICCTPCT in the last 72 hours. Sepsis Labs: Recent Labs  Lab 02/09/24 1420  LATICACIDVEN 1.1    No results found for this or any previous visit (from the past 240 hours).   Radiology Studies: HYBRID OR IMAGING ([facility] ONLY) Result Date: 02/09/2024 There is no interpretation for this exam.  This order is for images obtained during a surgical procedure.  Please See Surgeries Tab for more information regarding the procedure.   VAS US  LOWER EXTREMITY VENOUS (DVT) Result Date: 02/09/2024  Lower Venous DVT Study Patient Name:  MARKUS CASTEN  Date of Exam:   02/09/2024 Medical Rec #: [id number]         Accession #:    [id number] Date of Birth: June 28, 1955         Patient Gender: M Patient Age:   33 years Exam Location:  [address] Procedure:      VAS US  LOWER EXTREMITY VENOUS (DVT) Referring Phys: DINO SABLE --------------------------------------------------------------------------------  Indications: Pain. Other Indications: Patient states left foot pain since brain surgery a few weeks                    ago. Feels better with warm blanket. Left leg is cold with                    pallor. Performing Technologist: King Pierre RVT Supporting Technologist: Stoney Ross RVT  Examination Guidelines: A complete evaluation includes B-mode imaging, spectral Doppler, color Doppler, and power Doppler as needed of all accessible portions of each vessel. Bilateral testing is considered an integral part of a complete examination. Limited examinations for reoccurring indications may be performed as noted. The reflux portion of the exam is performed with the patient in reverse Trendelenburg.  +---------+---------------+---------+-----------+----------+--------------+ LEFT      CompressibilityPhasicitySpontaneityPropertiesThrombus Aging +---------+---------------+---------+-----------+----------+--------------+ CFV      Full           Yes      Yes                                 +---------+---------------+---------+-----------+----------+--------------+ SFJ      Full  Yes                                 +---------+---------------+---------+-----------+----------+--------------+ FV Prox  Full           Yes      Yes                                 +---------+---------------+---------+-----------+----------+--------------+ FV Mid   Full           Yes      Yes                                 +---------+---------------+---------+-----------+----------+--------------+ FV DistalFull           Yes      Yes                                 +---------+---------------+---------+-----------+----------+--------------+ POP      Full           Yes      Yes                                 +---------+---------------+---------+-----------+----------+--------------+ PTV      Full                    Yes                                 +---------+---------------+---------+-----------+----------+--------------+ PERO     Full                    Yes                                 +---------+---------------+---------+-----------+----------+--------------+  Findings reported to Dr. Junjua via RN at 12:50. Patient referred to ED.  Summary: LEFT: - There is no evidence of deep vein thrombosis in the lower extremity.  - Incidental finding of left femoral artery, popliteal artery, posterior tibial and peroneal artery occlusion. Dr. Sherl requested patient be sent to the ED.  *See table(s) above for measurements and observations. Electronically signed by Norman Serve on 02/09/2024 at 1:11:05 PM.    Final    Scheduled Meds:  amLODipine   10 mg Oral Daily   atorvastatin   80 mg Oral QHS   benazepril   40 mg Oral Daily   [START ON  02/14/2024] dexamethasone   2 mg Oral Daily   dexamethasone   4 mg Oral Daily   dofetilide   500 mcg Oral BID   ezetimibe   10 mg Oral Daily   insulin  aspart  0-15 Units Subcutaneous TID WC   insulin  aspart  0-5 Units Subcutaneous QHS   insulin  glargine-yfgn  50 Units Subcutaneous BID   levETIRAcetam  500 mg Oral BID   metoprolol  succinate  25 mg Oral Daily   pantoprazole   40 mg Oral Daily   Continuous Infusions:   LOS: 2 days    Time spent: 35 Mins    Darcel Dawley, MD [facility]   If 7PM-7AM, please contact night-coverage

## 2024-02-11 NOTE — Progress Notes (Signed)
 Mobility Specialist Progress Note:    02/11/24 1040  Mobility  Activity Ambulated with assistance  Level of Assistance Minimal assist, patient does 75% or more  Assistive Device Front wheel walker  Distance Ambulated (ft) 300 ft  Activity Response Tolerated well  Mobility Referral Yes  Mobility visit 1 Mobility  Mobility Specialist Start Time (ACUTE ONLY) 1040  Mobility Specialist Stop Time (ACUTE ONLY) 1056  Mobility Specialist Time Calculation (min) (ACUTE ONLY) 16 min   Pt received in bed, eager for mobility session. Ambulated in hallway with RW. MinA to stand. Tolerated well, no c/o. Returned to room, sitting up with all needs met. Washing up at sink. Nsg notified.   Truth Barot Mobility Specialist Please contact via Special Educational Needs Teacher or  Rehab office at [phone]

## 2024-02-11 NOTE — Progress Notes (Addendum)
 Progress Note    02/11/2024 9:53 AM 2 Days Post-Op  Subjective: Says he is feeling better this morning.  Denies any pain in the left leg or foot    Vitals:   02/11/24 0359 02/11/24 0815  BP: 131/60 126/66  Pulse:  62  Resp:  13  Temp: 97.9 F (36.6 C) 97.7 F (36.5 C)  SpO2:  99%    Physical Exam: General: Resting comfortably, NAD Cardiac: Regular Lungs: Nonlabored Incisions: Left groin incision intact and dry with staples.  Significant bruising present but this is soft.  LLE incisions bandaged and dry Extremities: Left foot is warm to touch with improved sensation and motor.  No good arterial Doppler signal of the foot  CBC    Component Value Date/Time   WBC 17.7 (H) 02/11/2024 0259   RBC 3.42 (L) 02/11/2024 0259   HGB 10.7 (L) 02/11/2024 0259   HGB 13.8 06/28/2023 0946   HCT 32.1 (L) 02/11/2024 0259   HCT 41.7 06/28/2023 0946   PLT 209 02/11/2024 0259   PLT 232 06/28/2023 0946   MCV 93.9 02/11/2024 0259   MCV 93 06/28/2023 0946   MCV 95 03/02/2014 1416   MCH 31.3 02/11/2024 0259   MCHC 33.3 02/11/2024 0259   RDW 13.2 02/11/2024 0259   RDW 12.3 06/28/2023 0946   RDW 12.8 03/02/2014 1416   LYMPHSABS 0.9 02/09/2024 1347   LYMPHSABS 2.0 10/26/2016 1423   LYMPHSABS 1.5 03/02/2014 1416   MONOABS 0.6 02/09/2024 1347   MONOABS 1.3 (H) 03/02/2014 1416   EOSABS 0.0 02/09/2024 1347   EOSABS 0.3 10/26/2016 1423   EOSABS 0.3 03/02/2014 1416   BASOSABS 0.0 02/09/2024 1347   BASOSABS 0.0 10/26/2016 1423   BASOSABS 0.1 03/02/2014 1416    BMET    Component Value Date/Time   NA 135 02/11/2024 0259   NA 140 06/28/2023 0946   NA 131 (L) 03/02/2014 1416   K 4.8 02/11/2024 0259   K 4.1 03/02/2014 1416   CL 105 02/11/2024 0259   CL 98 03/02/2014 1416   CO2 20 (L) 02/11/2024 0259   CO2 24 03/02/2014 1416   GLUCOSE 229 (H) 02/11/2024 0259   GLUCOSE 374 (H) 03/02/2014 1416   BUN 42 (H) 02/11/2024 0259   BUN 26 06/28/2023 0946   BUN 18 03/02/2014 1416    CREATININE 1.52 (H) 02/11/2024 0259   CREATININE 1.27 03/02/2014 1416   CALCIUM  8.6 (L) 02/11/2024 0259   CALCIUM  8.7 03/02/2014 1416   GFRNONAA 49 (L) 02/11/2024 0259   GFRNONAA >60 03/02/2014 1416   GFRNONAA >60 01/12/2013 0013   GFRAA 53 (L) 02/20/2020 0925   GFRAA >60 03/02/2014 1416   GFRAA >60 01/12/2013 0013    INR    Component Value Date/Time   INR 1.1 02/10/2024 0600   INR 1.1 01/11/2013 1615     Intake/Output Summary (Last 24 hours) at 02/11/2024 0953 Last data filed at 02/10/2024 2148 Gross per 24 hour  Intake 480 ml  Output 1050 ml  Net -570 ml      Assessment/Plan:  68 y.o. male is 2 days postop, s/p: Left common femoral thromboendarterectomy with bovine pericardial patch angioplasty, left lower extremity thrombectomy, left PT thrombectomy, left lower extremity 4 compartment fasciotomies, LLE angio    - He says that he is still feeling great this morning and denies any pain in his left leg or foot -He had no further bleeding events overnight.  His left groin incision remains dry.  There is significant bruising  in the groin as expected, however this is soft -Left lower extremity incisions are bandaged and dry.  Will change dressing tomorrow -No good Doppler signals in the left foot, however his left foot is warm to touch with good color.  He also has intact motor and sensation.  No indications for left lower extremity amputation at this time, given that he is medically stable and has no pain in the left foot -Hemoglobin stable at 10.7 -Okay to mobilize as tolerated.  Will restart therapeutic heparin  drip today, no bolus   Ahmed Holster, PA-C [facility] [phone] 02/11/2024 9:53 AM   VASCULAR STAFF ADDENDUM: I have independently interviewed and examined the patient. I agree with the above.   Debby SAILOR. Magda, MD [facility] Phone Number: [phone] 02/11/2024 10:02 AM

## 2024-02-11 NOTE — Progress Notes (Signed)
 PHARMACY - ANTICOAGULATION CONSULT NOTE  Pharmacy Consult for heparin  Indication: Ischemic limb, arterial occulsion  No Known Allergies  Patient Measurements: Height: 5' 11 (180.3 cm) Weight: 110.2 kg (242 lb 15.2 oz) IBW/kg (Calculated) : 75.3 HEPARIN  DW (KG): 98.9  Vital Signs: Temp: 97.6 F (36.4 C) (11/09 1638) Temp Source: Oral (11/09 1638) BP: 113/67 (11/09 1638) Pulse Rate: 56 (11/09 1638)  Labs: Recent Labs    02/09/24 1347 02/09/24 1408 02/09/24 1831 02/09/24 1903 02/10/24 0600 02/11/24 0259 02/11/24 1843  HGB 14.6  --    < > 11.2* 11.4* 10.7*  --   HCT 44.5  --    < > 33.0* 33.8* 32.1*  --   PLT 260  --   --   --  227 209  --   APTT  --   --   --   --  31  --  32  LABPROT  --   --   --   --  14.8  --   --   INR  --   --   --   --  1.1  --   --   HEPARINUNFRC  --  >1.10*  --   --   --   --  0.37  CREATININE 1.28*  --   --   --  1.47* 1.52*  --    < > = values in this interval not displayed.    Estimated Creatinine Clearance: 57.9 mL/min (A) (by C-G formula based on SCr of 1.52 mg/dL (H)).   Assessment: 52 YOM presenting with foot pain, recent 10/27 crani with tumor resection, hx of afib on Xarelto  PTA which was held d/t the tumor resection and he was instructed to start back on it 11/6 which his last dose was taken at 1900.  Now here with ischemic limb and arterial occlusion and pharmacy consulted to dose heparin .  No bolus d/t recent tumor resection and proximity to Xarelto  dose.  Heparin  was started in the ED, then turned off for angioplasty, thrombectomy and fasciotomy.  Heparin  was then resumed while patient was in the PACU.  RN reports mild oozing.  11/8 heparin  was stopped due to a small hematoma.   11/9 consulted to start heparin  with no bolus for left leg arterial thrombus. Will start bolus at slightly lower dose due to recent hematoma. Hgb 10.7, PLT WNL.  11/9 PM: aPTT 32 subtherapeutic, heparin  level not correlating at 0.37. Will dose based on  aPTT. Increase by 3.5 unit/kg/hr based on heparin  dosing weight of 99 kg. No infusion issues or bleeding per RN.  Goal of Therapy:  Heparin  level 0.3-0.7 units/ml aPTT 66-102 seconds Monitor platelets by anticoagulation protocol: Yes   Plan:  Increase heparin  infusion to 1650 units/hr, no bolus Check aPTT and Heparin  level with AM labs Monitor closely for bleeding  Larraine Brazier, PharmD Clinical Pharmacist 02/11/2024  8:03 PM **Pharmacist phone directory can now be found on [email or web address] ([email or web address]).  Listed under [facility].

## 2024-02-12 ENCOUNTER — Ambulatory Visit: Admitting: Radiation Oncology

## 2024-02-12 ENCOUNTER — Telehealth: Payer: Self-pay | Admitting: Radiation Oncology

## 2024-02-12 ENCOUNTER — Encounter (HOSPITAL_COMMUNITY): Payer: Self-pay | Admitting: Vascular Surgery

## 2024-02-12 ENCOUNTER — Inpatient Hospital Stay

## 2024-02-12 ENCOUNTER — Telehealth: Payer: Self-pay | Admitting: *Deleted

## 2024-02-12 DIAGNOSIS — I998 Other disorder of circulatory system: Secondary | ICD-10-CM | POA: Diagnosis not present

## 2024-02-12 LAB — GLUCOSE, CAPILLARY
Glucose-Capillary: 205 mg/dL — ABNORMAL HIGH (ref 70–99)
Glucose-Capillary: 249 mg/dL — ABNORMAL HIGH (ref 70–99)
Glucose-Capillary: 299 mg/dL — ABNORMAL HIGH (ref 70–99)
Glucose-Capillary: 400 mg/dL — ABNORMAL HIGH (ref 70–99)
Glucose-Capillary: 324 mg/dL — ABNORMAL HIGH (ref 70–99)
Glucose-Capillary: 185 mg/dL — ABNORMAL HIGH (ref 70–99)
Glucose-Capillary: 247 mg/dL — ABNORMAL HIGH (ref 70–99)
Glucose-Capillary: 301 mg/dL — ABNORMAL HIGH (ref 70–99)
Glucose-Capillary: 323 mg/dL — ABNORMAL HIGH (ref 70–99)
Glucose-Capillary: 115 mg/dL — ABNORMAL HIGH (ref 70–99)
Glucose-Capillary: 168 mg/dL — ABNORMAL HIGH (ref 70–99)

## 2024-02-12 LAB — CBC
HCT: 31 % — ABNORMAL LOW (ref 39.0–52.0)
Hemoglobin: 10.4 g/dL — ABNORMAL LOW (ref 13.0–17.0)
MCH: 31.2 pg (ref 26.0–34.0)
MCHC: 33.5 g/dL (ref 30.0–36.0)
MCV: 93.1 fL (ref 80.0–100.0)
Platelets: 212 K/uL (ref 150–400)
RBC: 3.33 MIL/uL — ABNORMAL LOW (ref 4.22–5.81)
RDW: 13 % (ref 11.5–15.5)
WBC: 16.9 K/uL — ABNORMAL HIGH (ref 4.0–10.5)
nRBC: 0 % (ref 0.0–0.2)

## 2024-02-12 LAB — PHOSPHORUS: Phosphorus: 3.5 mg/dL (ref 2.5–4.6)

## 2024-02-12 LAB — APTT
aPTT: 189 s (ref 24–36)
aPTT: 157 s — ABNORMAL HIGH (ref 24–36)
aPTT: 103 s — ABNORMAL HIGH (ref 24–36)

## 2024-02-12 LAB — HEPARIN LEVEL (UNFRACTIONATED): Heparin Unfractionated: >1.1 [IU]/mL — ABNORMAL HIGH (ref 0.30–0.70)

## 2024-02-12 LAB — BASIC METABOLIC PANEL WITH GFR
Anion gap: 10 (ref 5–15)
BUN: 43 mg/dL — ABNORMAL HIGH (ref 8–23)
CO2: 22 mmol/L (ref 22–32)
Calcium: 8.7 mg/dL — ABNORMAL LOW (ref 8.9–10.3)
Chloride: 105 mmol/L (ref 98–111)
Creatinine, Ser: 1.36 mg/dL — ABNORMAL HIGH (ref 0.61–1.24)
GFR, Estimated: 56 mL/min — ABNORMAL LOW (ref >60)
Glucose, Bld: 94 mg/dL (ref 70–99)
Potassium: 4.6 mmol/L (ref 3.5–5.1)
Sodium: 137 mmol/L (ref 135–145)

## 2024-02-12 LAB — MAGNESIUM: Magnesium: 2.2 mg/dL (ref 1.7–2.4)

## 2024-02-12 LAB — CALCIUM, IONIZED: Calcium, Ionized, Serum: 4.8 mg/dL (ref 4.5–5.6)

## 2024-02-12 MED ORDER — HEPARIN (PORCINE) 25000 UT/250ML-% IV SOLN
1250.0000 [IU]/h | INTRAVENOUS | Status: DC
  Administered 2024-02-12: 1400 [IU]/h via INTRAVENOUS
  Administered 2024-02-13: 1250 [IU]/h via INTRAVENOUS
  Filled 2024-02-12: qty 250

## 2024-02-12 MED ORDER — ORAL CARE MOUTH RINSE: 15.0000 mL | OROMUCOSAL | Status: DC | PRN

## 2024-02-12 MED ORDER — INFLUENZA VAC SPLIT HIGH-DOSE 0.5 ML IM SUSY
0.5000 mL | PREFILLED_SYRINGE | INTRAMUSCULAR | Status: AC
  Administered 2024-02-13: 0.5 mL via INTRAMUSCULAR
  Filled 2024-02-12: qty 0.5

## 2024-02-12 NOTE — Progress Notes (Signed)
 PHARMACY - ANTICOAGULATION CONSULT NOTE  Pharmacy Consult for heparin  Indication: Ischemic limb, arterial occulsion  No Known Allergies  Patient Measurements: Height: 5' 11 (180.3 cm) Weight: 110.2 kg (242 lb 15.2 oz) IBW/kg (Calculated) : 75.3 HEPARIN  DW (KG): 98.9  Vital Signs: Temp: 97.5 F (36.4 C) (11/10 1142) Temp Source: Oral (11/10 1142) BP: 144/63 (11/10 1142) Pulse Rate: 66 (11/10 1142)  Labs: Recent Labs    02/09/24 1408 02/09/24 1831 02/10/24 0600 02/11/24 0259 02/11/24 1843 02/12/24 0704 02/12/24 1115  HGB  --    < > 11.4* 10.7*  --  10.4*  --   HCT  --    < > 33.8* 32.1*  --  31.0*  --   PLT  --   --  227 209  --  212  --   APTT  --    < > 31  --  32 189* 157*  LABPROT  --   --  14.8  --   --   --   --   INR  --   --  1.1  --   --   --   --   HEPARINUNFRC >1.10*  --   --   --  0.37 >1.10*  --   CREATININE  --   --  1.47* 1.52*  --  1.36*  --    < > = values in this interval not displayed.    Estimated Creatinine Clearance: 64.7 mL/min (A) (by C-G formula based on SCr of 1.36 mg/dL (H)).   Assessment: 78 YOM presenting with foot pain, recent 10/27 crani with tumor resection, hx of afib on Xarelto  PTA which was held d/t the tumor resection and he was instructed to start back on it 11/6 which his last dose was taken at 1900.  Now here with ischemic limb and arterial occlusion and pharmacy consulted to dose heparin .   -11/7: femoral thromboendarterectomy, thrombectomy and fasciotomy  -11/7: heparin  restarted but stopped later due to groin hematoma -11/9 heparin  restarted  aPTT 189 (drawn on the same arm as heparin  infusion) and repeat was 157 (drawn from the opposite arm   Goal of Therapy:  Heparin  level 0.3-0.7 units/ml aPTT 66-102 seconds Monitor platelets by anticoagulation protocol: Yes   Plan:  -hold heparin  for 1 hour then resume at 1400 units/hr -aPTT in 8 hrs  Prentice Poisson, PharmD Clinical Pharmacist **Pharmacist phone directory can  now be found on [email or web address] ([email or web address]).  Listed under [facility].

## 2024-02-12 NOTE — TOC CM/SW Note (Signed)
 Transition of Care [facility]) - Inpatient Brief Assessment   Patient Details  Name: Douglas Hunter MRN: [id number] Date of Birth: Dec 09, 1955  Transition of Care [facility]) CM/SW Contact:    Tom-Johnson, Harvest Muskrat, RN Phone Number: 02/12/2024, 2:39 PM   Clinical Narrative:  Patient presented to the ED with c/o Lt Leg pain, admitted with Acute Lower Limb Ischemia, on Heparin  gtt. Patient underwent Revascularization and Thrombectomy, requiring Lt lower extremity 4 Compartment Fasciotomies on 02/09/24 by Vascular.  Patient has hx of A-Fib on Xarelto , CAD, CKD 2-3a, OSA on CPAP, HTN, T2DM, Lt Temporal Glioblastoma, underwent Craniotomy with Resection of Tumor on 01/29/24.    CM spoke with patient and wife [name] at bedside about needs for post hospital transition. Patient lives with his wife, has two supportive sons. Retired, was driving self until recent Craniotomy. Has all necessary DME's at home. Patient is active with outpatient ST at [facility] per [name].  PCP is Feldpausch, Cheryl BRAVO, MD and uses [facility] in [place].   Wife, [name] to be consulted instead of patient for any needs.  Patient not Medically ready for discharge.  CM will continue to follow as patient progresses with care towards discharge.        Transition of Care Asessment: Insurance and Status: Insurance coverage has been reviewed Patient has primary care physician: Yes Home environment has been reviewed: Yes Prior level of function:: Modified Independent Prior/Current Home Services: No current home services (Does outpatient ST) Social Drivers of Health Review: SDOH reviewed no interventions necessary Readmission risk has been reviewed: Yes

## 2024-02-12 NOTE — Telephone Encounter (Signed)
 pP wife called to cancel his Consult with Dr. Lenn. Patient was admitted to [facility] on Friday. She will call back to reschedule once he is discharged.

## 2024-02-12 NOTE — Progress Notes (Signed)
Placed patient on CPAP for the night via auto-mode.  

## 2024-02-12 NOTE — Telephone Encounter (Signed)
 Molecular Profiling Requisition faxed to [facility], [phone].  Fax confirmation received.

## 2024-02-12 NOTE — Progress Notes (Addendum)
 Progress Note    02/12/2024 8:12 AM 3 Days Post-Op  Subjective: Says his left foot still feels good, denies any pain or weakness    Vitals:   02/12/24 0026 02/12/24 0400  BP:  134/60  Pulse: 68 62  Resp: 18 (!) 22  Temp:  97.6 F (36.4 C)  SpO2: 97% 98%    Physical Exam: General: Resting comfortably in bed, NAD Cardiac: Regular Lungs: Nonlabored Incisions: Left groin incision intact and dry with staples.  Unchanged bruising of the groin, however this is soft.  LLE fasciotomy incisions well-appearing.  Incisions were redressed with Kerlix and Ace Extremities: Left foot with intact motor and sensation.  Venous versus dampened monophasic left PT Doppler signal.  Monophasic left peroneal Doppler signal above the ankle.  Left calf is soft   CBC    Component Value Date/Time   WBC 16.9 (H) 02/12/2024 0704   RBC 3.33 (L) 02/12/2024 0704   HGB 10.4 (L) 02/12/2024 0704   HGB 13.8 06/28/2023 0946   HCT 31.0 (L) 02/12/2024 0704   HCT 41.7 06/28/2023 0946   PLT 212 02/12/2024 0704   PLT 232 06/28/2023 0946   MCV 93.1 02/12/2024 0704   MCV 93 06/28/2023 0946   MCV 95 03/02/2014 1416   MCH 31.2 02/12/2024 0704   MCHC 33.5 02/12/2024 0704   RDW 13.0 02/12/2024 0704   RDW 12.3 06/28/2023 0946   RDW 12.8 03/02/2014 1416   LYMPHSABS 0.9 02/09/2024 1347   LYMPHSABS 2.0 10/26/2016 1423   LYMPHSABS 1.5 03/02/2014 1416   MONOABS 0.6 02/09/2024 1347   MONOABS 1.3 (H) 03/02/2014 1416   EOSABS 0.0 02/09/2024 1347   EOSABS 0.3 10/26/2016 1423   EOSABS 0.3 03/02/2014 1416   BASOSABS 0.0 02/09/2024 1347   BASOSABS 0.0 10/26/2016 1423   BASOSABS 0.1 03/02/2014 1416    BMET    Component Value Date/Time   NA 135 02/11/2024 0259   NA 140 06/28/2023 0946   NA 131 (L) 03/02/2014 1416   K 4.8 02/11/2024 0259   K 4.1 03/02/2014 1416   CL 105 02/11/2024 0259   CL 98 03/02/2014 1416   CO2 20 (L) 02/11/2024 0259   CO2 24 03/02/2014 1416   GLUCOSE 229 (H) 02/11/2024 0259   GLUCOSE  374 (H) 03/02/2014 1416   BUN 42 (H) 02/11/2024 0259   BUN 26 06/28/2023 0946   BUN 18 03/02/2014 1416   CREATININE 1.52 (H) 02/11/2024 0259   CREATININE 1.27 03/02/2014 1416   CALCIUM  8.6 (L) 02/11/2024 0259   CALCIUM  8.7 03/02/2014 1416   GFRNONAA 49 (L) 02/11/2024 0259   GFRNONAA >60 03/02/2014 1416   GFRNONAA >60 01/12/2013 0013   GFRAA 53 (L) 02/20/2020 0925   GFRAA >60 03/02/2014 1416   GFRAA >60 01/12/2013 0013    INR    Component Value Date/Time   INR 1.1 02/10/2024 0600   INR 1.1 01/11/2013 1615    No intake or output data in the 24 hours ending 02/12/24 9187    Assessment/Plan:  68 y.o. male is 3 days post op, s/p: Left common femoral thromboendarterectomy with bovine pericardial patch angioplasty, left lower extremity thrombectomy, left PT thrombectomy, left lower extremity 4 compartment fasciotomies, LLE angio    - He says that he is still feeling good this morning without any recurrent pain or weakness in the left leg or foot.  He walked up and down the hallways with physical therapy yesterday -Heparin  drip was restarted yesterday.  No further bleeding events.  Left groin remains bruised, however this is soft -I have redressed the patient's left lower extremity fasciotomy incisions.  These are currently well-appearing.  -Left calf is soft without evidence of compartment syndrome -He has intact motor and sensation of the left foot.  He has a monophasic left peroneal Doppler signal above the ankle and possible weak monophasic PT signal vs venous signal -Morning labs are pending -Continue heparin  drip.  Still no indications for left lower extremity amputation at this time   Ahmed Holster, [place] [facility] [phone] 02/12/2024 8:12 AM   VASCULAR STAFF ADDENDUM: I have independently interviewed and examined the patient. I agree with the above.   Debby SAILOR. Magda, MD [facility] Phone Number:  [phone] 02/12/2024 4:04 PM

## 2024-02-12 NOTE — Progress Notes (Signed)
 Mobility Specialist Progress Note;   02/12/24 1123  Mobility  Activity Ambulated with assistance  Level of Assistance Standby assist, set-up cues, supervision of patient - no hands on  Assistive Device Front wheel walker  Distance Ambulated (ft) 400 ft  Activity Response Tolerated well  Mobility Referral Yes  Mobility visit 1 Mobility  Mobility Specialist Start Time (ACUTE ONLY) 1123  Mobility Specialist Stop Time (ACUTE ONLY) 1139  Mobility Specialist Time Calculation (min) (ACUTE ONLY) 16 min   Pt eager for mobility. Required no physical assistance during ambulation, SV for safety. VSS throughout and no c/o when asked. Pt returned back to bed with all needs met, call bell in reach. Wife present.   Lauraine Erm Mobility Specialist Please contact via SecureChat or [employer] [phone]

## 2024-02-12 NOTE — Progress Notes (Signed)
 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  His Xarelto  recently was held in the setting of brain surgery and was restarted yesterday.  Patient had a follow-up visit with neurosurgery  and had complained of left leg pain.  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  He was sent to the ED for further evaluation. Patient was evaluated by vascular surgery and started on IV heparin . He was taken to the OR for emergent left lower extremity revascularization including thrombectomy and also required left lower extremity 4 compartment fasciotomies. After surgery Patient was admitted under hospitalist's service for further management.Informed by vascular surgery PA that unfortunately no good blood flow could be restored and patient had dead muscles in his compartments. As such, he will require left AKA at some point. Recommended continuing heparin  drip for now.   Assessment & Plan:   Principal Problem:   Acute lower limb ischemia Active Problems:   HTN (hypertension)   HLD (hyperlipidemia)   Paroxysmal atrial fibrillation (HCC)   Leukocytosis   Acute Left lower extremity / Limb Ischemia: Patient presented with left leg pain, cool left foot with no palpable pulses.   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  Patient was taken to the OR by vascular surgery for emergent  left lower extremity revascularization including thrombectomy and also required left lower extremity 4 compartment fasciotomies.  Informed by vascular surgery PA that unfortunately no good blood flow could be restored and patient had dead muscles in his compartments.  As such, he will require left AKA at some point.  Recommended continuing heparin  drip for now.  Continue pain management.  Vascular surgery following Heparin  was stopped due to small hematoma in left groin and left ankle area, Bleeding stopped with 30 mins of pressure and pausing Heparin . Plan is to mobilize, held heparin  for one day.  Left groin hematoma improved.  Resumed heparin  yesterday. There is no indication for left lower extremity amputation at this time given that he is medically stable and no pain in the left foot. Patient is started on IV heparin .  He denies any recurrence of pain or weakness in the left leg or foot.   Leukocytosis:> Improving  Likely In the setting of steroid use.   No fever, lactic acidosis, or signs of sepsis. WBC improving    Mild normal anion gap metabolic acidosis: Metabolic acidosis resolved with IV hydration.   Hypocalcemia; Calcium  improved.   Left temporal glioblastoma status post craniotomy with resection of tumor on 01/29/2024 Patient had an office visit with oncology yesterday and plan is to start radiation and chemotherapy.   He will need outpatient oncology follow-up.  Continue levetiracetam 500 mg twice daily for seizure prophylaxis.  Seen by oncology and dexamethasone  dose was decreased to 4 mg daily x 5 days, then 2 mg daily x 5 days, then stopped if tolerated.   Paroxysmal A-flutter/flutter: Hold home Xarelto  as patient is currently on heparin  drip.   Continue metoprolol   and dofetilide . HR controlled.   CAD: Patient is not endorsing chest pain.   Continue metoprolol  and statin.   Hypertension: Continue metoprolol , amlodipine , and benazepril .   Hyperlipidemia; Continue  atorvastatin  and ezetimibe .   Insulin -dependent type 2 diabetes Hemoglobin A1c 8.0 on 01/30/2024.   Continue home long-acting insulin .   Placed on moderate sliding scale insulin  ACHS.   CKD stage 2-3a: Creatinine stable, monitor labs.   OSA: Continue nightly CPAP.   GERD: Continue PPI.   DVT prophylaxis: Heparin  IV Code Status: Full code Family Communication: Wife at bed side Disposition Plan:    Status is: Inpatient Remains inpatient appropriate because: Severity of illness.  Patient restarted on IV heparin .   Consultants:  Vascular Surgery  Procedures: Thromboectomy , Fasciotomies.  Antimicrobials:  Anti-infectives (From admission, onward)    Start     Dose/Rate Route Frequency Ordered Stop   02/09/24 1525  ceFAZolin  (ANCEF ) 2-4 GM/100ML-% IVPB       Note to Pharmacy: Effie Rily O: cabinet override      02/09/24 1525 02/10/24 0329      Subjective: Patient was seen and examined at bedside..Overnight events noted. Patient reports pain is manageable.  Hematoma has improved.  Patient has no pain in the left ankle and groin.  Objective: Vitals:   02/12/24 0026 02/12/24 0400 02/12/24 0815 02/12/24 1031  BP:  134/60 (!) 122/54 (!) 122/54  Pulse: 68 62 (!) 58 63  Resp: 18 (!) 22 20   Temp:  97.6 F (36.4 C) (!) 97.5 F (36.4 C)   TempSrc:  Oral Oral   SpO2: 97% 98% 99%   Weight:      Height:       No intake or output data in the 24 hours ending 02/12/24 1133  Filed Weights   02/11/24 2000  Weight: 110.2 kg    Examination:  General exam: Appears calm and comfortable, not in any acute distress.  Respiratory system: CTA Bilaterally. Respiratory effort normal. RR 15 Cardiovascular system: S1 & S2 heard, RRR. No JVD, murmurs, rubs, gallops or clicks.  Gastrointestinal system: Abdomen is non distended, soft and non tender. Normal bowel sounds heard. Central nervous system: Alert and oriented x 3. No focal neurological deficits. Extremities:  Left ankle  and groin hematoma, soft , improving Skin: No rashes, lesions or ulcers Psychiatry: Judgement and insight appear normal. Mood & affect appropriate.   Data Reviewed: I have personally reviewed following labs and imaging studies  CBC: Recent Labs  Lab 02/09/24 1347 02/09/24 1831 02/09/24 1903 02/10/24 0600 02/11/24 0259 02/12/24 0704  WBC 18.0*  --   --  21.8* 17.7* 16.9*  NEUTROABS 16.4*  --   --   --   --   --   HGB 14.6 12.9* 11.2* 11.4* 10.7* 10.4*  HCT 44.5 38.0* 33.0* 33.8* 32.1* 31.0*  MCV 93.5  --   --  92.6 93.9 93.1  PLT 260  --   --  227 209 212   Basic Metabolic Panel: Recent Labs  Lab 02/09/24 1347 02/09/24 1831 02/09/24 1903 02/10/24 0600 02/11/24 0259 02/12/24 0704  NA 135 137 137 133* 135 137  K 4.6 4.6 4.6 5.0 4.8 4.6  CL 106  --   --  104 105 105  CO2 18*  --   --  16* 20* 22  GLUCOSE 192*  --   --  233* 229* PENDING  BUN 30*  --   --  31* 42* 43*  CREATININE 1.28*  --   --  1.47* 1.52* 1.36*  CALCIUM  7.9*  --   --  8.4* 8.6* 8.7*  MG  --   --   --   --  2.1 2.2  PHOS  --   --   --   --  3.9 3.5   GFR: Estimated Creatinine Clearance: 64.7 mL/min (A) (by C-G formula based on SCr of 1.36 mg/dL (H)). Liver Function Tests: Recent Labs  Lab 02/10/24 0600  AST 17  ALT 25  ALKPHOS 76  BILITOT 1.5*  PROT 5.5*  ALBUMIN  2.9*   No results for input(s): LIPASE, AMYLASE in the last 168 hours. No results for input(s): AMMONIA in the last 168 hours. Coagulation Profile: Recent Labs  Lab 02/10/24 0600  INR 1.1   Cardiac Enzymes: No results for input(s): CKTOTAL, CKMB, CKMBINDEX, TROPONINI in the last 168 hours. BNP (last 3 results) No results for input(s): PROBNP in the last 8760 hours. HbA1C: No results for input(s): HGBA1C in the last 72 hours. CBG: Recent Labs  Lab 02/11/24 0604 02/11/24 1217 02/11/24 1637 02/11/24 2111 02/12/24 0619  GLUCAP 185* 247* 301* 323* 115*   Lipid Profile: No results for input(s): CHOL,  HDL, LDLCALC, TRIG, CHOLHDL, LDLDIRECT in the last 72 hours. Thyroid  Function Tests: No results for input(s): TSH, T4TOTAL, FREET4, T3FREE, THYROIDAB in the last 72 hours. Anemia Panel: No results for input(s): VITAMINB12, FOLATE, FERRITIN, TIBC, IRON, RETICCTPCT in the last 72 hours. Sepsis Labs: Recent Labs  Lab 02/09/24 1420  LATICACIDVEN 1.1    No results found for this or any previous visit (from the past 240 hours).   Radiology Studies: No results found.  Scheduled Meds:  amLODipine   10 mg Oral Daily   atorvastatin   80 mg Oral QHS   benazepril   40 mg Oral Daily   [START ON 02/14/2024] dexamethasone   2 mg Oral Daily   dexamethasone   4 mg Oral Daily   dofetilide   500 mcg Oral BID   ezetimibe   10 mg Oral Daily   insulin  aspart  0-15 Units Subcutaneous TID WC   insulin  aspart  0-5 Units Subcutaneous QHS   insulin  glargine-yfgn  50 Units Subcutaneous BID   levETIRAcetam  500 mg Oral BID   metoprolol  succinate  25 mg Oral Daily   pantoprazole   40 mg Oral Daily   Continuous Infusions:  heparin  1,650 Units/hr (02/12/24 0554)     LOS: 3 days    Time spent: 35 Mins    Darcel Dawley, MD [facility]   If 7PM-7AM, please contact night-coverage

## 2024-02-12 NOTE — Progress Notes (Signed)
 [facility] Clinical Social Work  Clinical Social Work was referred by medical provider, Dr. [name], for assessment of psychosocial needs.  Clinical Social Worker contacted caregiver by phone to offer support and assess for needs.    [name], patient's wife, stated patient was currently in the hospital due to a blood clot.  She had to cancel a consultative visit with Dr. Lenn and wishes to reschedule after Thanksgiving.  CSW gave message to his RN.  She stated she currently has everything she needs.  Interventions: Provided patient with information about CSW role and services provided.  She was open to CSW calling her again in the future.       Follow Up Plan:  CSW will follow-up with patient by phone     Macario CHRISTELLA Au, LCSW  Clinical Social Worker [place]

## 2024-02-12 NOTE — Progress Notes (Signed)
 PHARMACY - ANTICOAGULATION CONSULT NOTE  Pharmacy Consult for heparin  Indication: Ischemic limb, arterial occulsion  No Known Allergies  Patient Measurements: Height: 5' 11 (180.3 cm) Weight: 110.2 kg (242 lb 15.2 oz) IBW/kg (Calculated) : Douglas.3 HEPARIN  DW (KG): 98.9  Vital Signs: Temp: 97.8 F (36.6 C) (11/10 1620) Temp Source: Oral (11/10 1620) BP: 125/59 (11/10 1620) Pulse Rate: 56 (11/10 1620)  Labs: Recent Labs    02/10/24 0600 02/11/24 0259 02/11/24 1843 02/12/24 0704 02/12/24 1115 02/12/24 1715  HGB 11.4* 10.7*  --  10.4*  --   --   HCT 33.8* 32.1*  --  31.0*  --   --   PLT 227 209  --  212  --   --   APTT 31  --  32 189* 157* 103*  LABPROT 14.8  --   --   --   --   --   INR 1.1  --   --   --   --   --   HEPARINUNFRC  --   --  0.37 >1.10*  --   --   CREATININE 1.47* 1.52*  --  1.36*  --   --     Estimated Creatinine Clearance: 64.7 mL/min (A) (by C-G formula based on SCr of 1.36 mg/dL (H)).   Assessment: Douglas Hunter presenting with foot pain, recent 10/27 crani with tumor resection, hx of afib on Xarelto  PTA which was held d/t the tumor resection and he was instructed to start back on it 11/6 which his last dose was taken at 1900.  Now here with ischemic limb and arterial occlusion and pharmacy consulted to dose heparin .   -11/7: femoral thromboendarterectomy, thrombectomy and fasciotomy  -11/7: heparin  restarted but stopped later due to groin hematoma -11/9 heparin  restarted  Called by RN because patient was having some small amount of blood oozing out from heparin  infusion site. Stat aPTT still shows slight supratherapeutic despite recent hold for 1 hour, will reduce dose and check aPTT in 8hrs.  Goal of Therapy:  Heparin  level 0.3-0.7 units/ml aPTT 66-102 seconds Monitor platelets by anticoagulation protocol: Yes   Plan:  Reduce heparin  infusion to 1300 units/hr -aPTT in 8 hrs  Larraine Brazier, PharmD Clinical Pharmacist 02/12/2024  6:07  PM **Pharmacist phone directory can now be found on [email or web address] ([email or web address]).  Listed under [facility].

## 2024-02-12 NOTE — Plan of Care (Addendum)
 This patient remains on MC-4E as of time of writing. This patient's admission profile completed overnight by this RN. The patient is AA+Ox4. No supplemental O2 requirement. Atrial fib per telemetry. Operative site to LLE, ACE bandage in place; for pulses, see flowsheet. Heparin  infusion via PIV. Potential for discharge to home tomorrow.   Problem: Education: Goal: Knowledge of General Education information will improve Description: Including pain rating scale, medication(s)/side effects and non-pharmacologic comfort measures Outcome: Progressing   Problem: Health Behavior/Discharge Planning: Goal: Ability to manage health-related needs will improve Outcome: Progressing   Problem: Clinical Measurements: Goal: Ability to maintain clinical measurements within normal limits will improve Outcome: Progressing Goal: Will remain free from infection Outcome: Progressing Goal: Diagnostic test results will improve Outcome: Progressing Goal: Respiratory complications will improve Outcome: Progressing Goal: Cardiovascular complication will be avoided Outcome: Progressing   Problem: Activity: Goal: Risk for activity intolerance will decrease Outcome: Progressing   Problem: Nutrition: Goal: Adequate nutrition will be maintained Outcome: Progressing   Problem: Coping: Goal: Level of anxiety will decrease Outcome: Progressing   Problem: Elimination: Goal: Will not experience complications related to bowel motility Outcome: Progressing Goal: Will not experience complications related to urinary retention Outcome: Progressing   Problem: Pain Managment: Goal: General experience of comfort will improve and/or be controlled Outcome: Progressing   Problem: Safety: Goal: Ability to remain free from injury will improve Outcome: Progressing   Problem: Skin Integrity: Goal: Risk for impaired skin integrity will decrease Outcome: Progressing   Problem: Education: Goal: Ability to describe  self-care measures that may prevent or decrease complications (Diabetes Survival Skills Education) will improve Outcome: Progressing Goal: Individualized Educational Video(s) Outcome: Progressing   Problem: Coping: Goal: Ability to adjust to condition or change in health will improve Outcome: Progressing   Problem: Fluid Volume: Goal: Ability to maintain a balanced intake and output will improve Outcome: Progressing   Problem: Health Behavior/Discharge Planning: Goal: Ability to identify and utilize available resources and services will improve Outcome: Progressing Goal: Ability to manage health-related needs will improve Outcome: Progressing   Problem: Metabolic: Goal: Ability to maintain appropriate glucose levels will improve Outcome: Progressing   Problem: Nutritional: Goal: Maintenance of adequate nutrition will improve Outcome: Progressing Goal: Progress toward achieving an optimal weight will improve Outcome: Progressing   Problem: Skin Integrity: Goal: Risk for impaired skin integrity will decrease Outcome: Progressing   Problem: Tissue Perfusion: Goal: Adequacy of tissue perfusion will improve Outcome: Progressing   Problem: Education: Goal: Knowledge of disease and its progression will improve Outcome: Progressing Goal: Individualized Educational Video(s) Outcome: Progressing   Problem: Fluid Volume: Goal: Compliance with measures to maintain balanced fluid volume will improve Outcome: Progressing   Problem: Health Behavior/Discharge Planning: Goal: Ability to manage health-related needs will improve Outcome: Progressing   Problem: Nutritional: Goal: Ability to make healthy dietary choices will improve Outcome: Progressing   Problem: Clinical Measurements: Goal: Complications related to the disease process, condition or treatment will be avoided or minimized Outcome: Progressing   Problem: Education: Goal: Understanding of CV disease, CV risk  reduction, and recovery process will improve Outcome: Progressing Goal: Individualized Educational Video(s) Outcome: Progressing   Problem: Activity: Goal: Ability to return to baseline activity level will improve Outcome: Progressing   Problem: Cardiovascular: Goal: Ability to achieve and maintain adequate cardiovascular perfusion will improve Outcome: Progressing Goal: Vascular access site(s) Level 0-1 will be maintained Outcome: Progressing   Problem: Health Behavior/Discharge Planning: Goal: Ability to safely manage health-related needs after discharge will improve Outcome: Progressing  Problem: Education: Goal: Knowledge of disease or condition will improve Outcome: Progressing Goal: Understanding of medication regimen will improve Outcome: Progressing Goal: Individualized Educational Video(s) Outcome: Progressing   Problem: Activity: Goal: Ability to tolerate increased activity will improve Outcome: Progressing   Problem: Cardiac: Goal: Ability to achieve and maintain adequate cardiopulmonary perfusion will improve Outcome: Progressing   Problem: Health Behavior/Discharge Planning: Goal: Ability to safely manage health-related needs after discharge will improve Outcome: Progressing

## 2024-02-13 ENCOUNTER — Ambulatory Visit: Admitting: Speech Pathology

## 2024-02-13 DIAGNOSIS — I998 Other disorder of circulatory system: Secondary | ICD-10-CM | POA: Diagnosis not present

## 2024-02-13 LAB — BASIC METABOLIC PANEL WITH GFR
Anion gap: 8 (ref 5–15)
BUN: 36 mg/dL — ABNORMAL HIGH (ref 8–23)
CO2: 19 mmol/L — ABNORMAL LOW (ref 22–32)
Calcium: 8.4 mg/dL — ABNORMAL LOW (ref 8.9–10.3)
Chloride: 106 mmol/L (ref 98–111)
Creatinine, Ser: 0.98 mg/dL (ref 0.61–1.24)
GFR, Estimated: >60 mL/min (ref >60)
Glucose, Bld: 143 mg/dL — ABNORMAL HIGH (ref 70–99)
Potassium: 4.2 mmol/L (ref 3.5–5.1)
Sodium: 133 mmol/L — ABNORMAL LOW (ref 135–145)

## 2024-02-13 LAB — GLUCOSE, CAPILLARY
Glucose-Capillary: 222 mg/dL — ABNORMAL HIGH (ref 70–99)
Glucose-Capillary: 228 mg/dL — ABNORMAL HIGH (ref 70–99)
Glucose-Capillary: 307 mg/dL — ABNORMAL HIGH (ref 70–99)
Glucose-Capillary: 83 mg/dL (ref 70–99)

## 2024-02-13 LAB — HEPARIN LEVEL (UNFRACTIONATED): Heparin Unfractionated: 0.96 [IU]/mL — ABNORMAL HIGH (ref 0.30–0.70)

## 2024-02-13 LAB — APTT: aPTT: 100 s — ABNORMAL HIGH (ref 24–36)

## 2024-02-13 LAB — CBC
HCT: 30.2 % — ABNORMAL LOW (ref 39.0–52.0)
Hemoglobin: 10.1 g/dL — ABNORMAL LOW (ref 13.0–17.0)
MCH: 31.3 pg (ref 26.0–34.0)
MCHC: 33.4 g/dL (ref 30.0–36.0)
MCV: 93.5 fL (ref 80.0–100.0)
Platelets: 199 K/uL (ref 150–400)
RBC: 3.23 MIL/uL — ABNORMAL LOW (ref 4.22–5.81)
RDW: 13 % (ref 11.5–15.5)
WBC: 14.9 K/uL — ABNORMAL HIGH (ref 4.0–10.5)
nRBC: 0 % (ref 0.0–0.2)

## 2024-02-13 MED ORDER — DEXAMETHASONE 2 MG PO TABS: 2.0000 mg | ORAL_TABLET | Freq: Every day | ORAL | 0 refills | Status: DC

## 2024-02-13 MED ORDER — RIVAROXABAN 20 MG PO TABS: 20.0000 mg | ORAL_TABLET | Freq: Once | ORAL | Status: DC

## 2024-02-13 MED ORDER — INSULIN GLARGINE-YFGN 100 UNIT/ML ~~LOC~~ SOLN: 50.0000 [IU] | Freq: Two times a day (BID) | SUBCUTANEOUS | 11 refills | Status: DC

## 2024-02-13 NOTE — Discharge Summary (Signed)
 Physician Discharge Summary  Douglas Hunter FMW:[id number] DOB: 1955-06-01 DOA: 02/09/2024  PCP: [name], MD  Admit date: 02/09/2024  Discharge date: 02/13/2024  Admitted From: Home  Disposition:  Home  Recommendations for Outpatient Follow-up:  Follow up with PCP in 1-2 weeks. Please obtain BMP/CBC in one week. Advised to resume Xarelto  as prescribed. Advised to follow-up with Vascular surgery in 1 week. Advised to resume blood pressure medications if blood pressure improves.  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  His Xarelto  recently was held in the setting of brain surgery and was restarted yesterday.  Patient had a follow-up visit with neurosurgery and had complained of left leg pain.  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  He was sent to the ED for further evaluation. Patient was evaluated by vascular surgery and started on IV heparin . He was taken to the OR for emergent left lower extremity revascularization including thrombectomy and also required left lower extremity 4 compartment fasciotomies. After surgery Patient was admitted under hospitalist's service for further management. Informed by vascular surgery PA that unfortunately no good blood flow could be restored and patient had dead muscles in his compartments. As such, he will require left AKA at some point. Recommended continuing heparin  drip for  now.   Subsequently patient has made significant improvement.  Patient has good perfusion.  He has developed small hematoma in the left groin and left ankle area.  Bleeding was stopped after 30 minutes of pressure and pausing heparin .  Vascular surgery recommended PT and OT evaluation.  There is no any indication for left lower extremity amputation at this time given that he is medically stable and no pain in the left foot.  Patient was resumed on IV heparin  and subsequently changed with Xarelto .  Vascular surgery signed off,  Patient is being discharged home.  Discharge Diagnoses:  Principal Problem:   Acute lower limb ischemia Active Problems:   HTN (hypertension)   HLD (hyperlipidemia)   Paroxysmal atrial fibrillation (HCC)   Leukocytosis  Acute Left lower extremity / Limb Ischemia: Patient presented with left leg pain, cool left foot with no palpable pulses.   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  Patient was taken to the OR by vascular surgery for emergent left lower extremity revascularization including thrombectomy and also required left lower extremity 4 compartment fasciotomies.  Informed by vascular surgery PA that unfortunately no good blood flow could be restored and patient had dead muscles in his compartments.  As such, he will require left AKA at some point.  Recommended continuing heparin  drip for now.  Continue pain management.  Vascular surgery following Heparin  was stopped due to small hematoma in left groin and left ankle area, Bleeding stopped with 30 mins of pressure and pausing Heparin . Plan is to mobilize, held heparin  for one day.  Left groin hematoma improved.  Resumed heparin  yesterday. There is no indication for left lower extremity amputation at this time given that he is  medically stable and no pain in the left foot. Patient is started on IV heparin .  He  denies any recurrence of pain or weakness in the left leg or foot. Heparin  discontinued and transition to Xarelto .  Vascular surgery signed off.  Patient being discharged home.    Leukocytosis:> Improving  Likely In the setting of steroid use.   No fever, lactic acidosis, or signs of sepsis. WBC improving    Mild normal anion gap metabolic acidosis: Metabolic acidosis resolved with IV hydration.   Hypocalcemia; Calcium  improved.   Left temporal glioblastoma status post craniotomy with resection of tumor on 01/29/2024 Patient had an office visit with oncology yesterday and plan is to start radiation and chemotherapy.   He will need outpatient oncology follow-up.  Continue levetiracetam 500 mg twice daily for seizure prophylaxis.   Seen by oncology and dexamethasone  dose was decreased to 4 mg daily x 5 days, then 2 mg daily x 5 days, then stopped if tolerated.   Paroxysmal A-flutter/flutter: Continue Xarelto . Continue metoprolol  and dofetilide . HR controlled.   CAD: Patient is not endorsing chest pain.   Continue metoprolol  and statin.   Hypertension: Continue metoprolol , amlodipine , and benazepril .   Hyperlipidemia; Continue atorvastatin  and ezetimibe .   Insulin -dependent type 2 diabetes Hemoglobin A1c 8.0 on 01/30/2024.   Continue home long-acting insulin .   Placed on moderate sliding scale insulin  ACHS.   CKD stage 2-3a: Creatinine stable, monitor labs.   OSA: Continue nightly CPAP.   GERD: Continue PPI.  Discharge Instructions  Discharge Instructions     Call MD for:  difficulty breathing, headache or visual disturbances   Complete by: As directed    Call MD for:  persistant nausea and vomiting   Complete by: As directed    Diet - low sodium heart healthy   Complete by: As directed    Diet Carb Modified   Complete by: As directed    Discharge instructions   Complete by: As directed    Advised to follow-up with primary care physician in 1 week. Advised  to resume Xarelto  as prescribed. Advised to follow-up with vascular surgery in 1 week. Advised to resume blood pressure medication if blood pressure improved.   Discharge wound care:   Complete by: As directed    Follow-up vascular surgery as scheduled.   Increase activity slowly   Complete by: As directed       Allergies as of 02/13/2024   No Known Allergies      Medication List     STOP taking these medications    amLODipine  10 MG tablet Commonly known as: NORVASC    insulin  glargine 100 UNIT/ML injection Commonly known as: LANTUS  Replaced by: insulin  glargine-yfgn 100 UNIT/ML injection       TAKE these medications    acetaminophen  500 MG tablet Commonly known as: TYLENOL  Take 500 mg by mouth 4 (four) times daily as needed for mild pain or moderate pain.   atorvastatin  80 MG tablet Commonly known as: LIPITOR  Take 80 mg by mouth at bedtime.   baclofen  10 MG tablet Commonly known as: LIORESAL  Take 1 tablet (10 mg total) by mouth 2 (two) times daily as needed (neck pain, muscle spasms).   benazepril  40 MG tablet Commonly known as: LOTENSIN  TAKE 1 TABLET BY MOUTH ONCE DAILY   butalbital-acetaminophen -caffeine 50-325-40 MG tablet Commonly known as: FIORICET Take 2 tablets by mouth every 4 (four) hours as needed for headache (Moderate pain).   cholecalciferol  25 MCG (1000 UNIT) tablet Commonly known as:  VITAMIN D3 Take 500 Units by mouth daily.   dexamethasone  2 MG tablet Commonly known as: DECADRON  Take 1 tablet (2 mg total) by mouth daily. Start taking on: February 14, 2024 What changed:  medication strength how much to take when to take this   dofetilide  500 MCG capsule Commonly known as: TIKOSYN  TAKE 1 CAPSULE BY MOUTH TWICE DAILY   ezetimibe  10 MG tablet Commonly known as: ZETIA  TAKE 1 TABLET BY MOUTH ONCE DAILY What changed: additional instructions   FreeStyle Libre 14 Day Sensor Misc SMARTSIG:1 Kit(s) Topical Every 2 Weeks   furosemide   20 MG tablet Commonly known as: LASIX  TAKE 1 TABLET BY MOUTH TWICE DAILY AS NEEDED   insulin  glargine-yfgn 100 UNIT/ML injection Commonly known as: SEMGLEE  Inject 0.5 mLs (50 Units total) into the skin 2 (two) times daily. Replaces: insulin  glargine 100 UNIT/ML injection   insulin  lispro 100 UNIT/ML KiwkPen Commonly known as: HUMALOG  Inject 8-12 Units into the skin 2 (two) times daily. Sliding Scale   INSULIN  SYRINGE 1CC/29G 29G X 1/2 1 ML Misc USE AS DIRECTED   Jardiance 25 MG Tabs tablet Generic drug: empagliflozin Take 12.5 mg by mouth daily.   levETIRAcetam 500 MG tablet Commonly known as: Keppra Take 1 tablet (500 mg total) by mouth 2 (two) times daily.   metoprolol  succinate 25 MG 24 hr tablet Commonly known as: TOPROL -XL TAKE 1 TABLET BY MOUTH ONCE DAILY   nitroGLYCERIN  0.4 MG SL tablet Commonly known as: NITROSTAT  Place 1 tablet (0.4 mg total) under the tongue every 5 (five) minutes as needed for chest pain.   ONE TOUCH ULTRA TEST test strip Generic drug: glucose blood TEST THREE TIMES A DAY AS DIRECTED.   OVER THE COUNTER MEDICATION Take 1 capsule by mouth 2 (two) times daily. Omega XL   pantoprazole  40 MG tablet Commonly known as: PROTONIX  Take 1 tablet (40 mg total) by mouth daily. Stop taking if no longer on decadron .   Xarelto  20 MG Tabs tablet Generic drug: rivaroxaban  Take 1 tablet (20 mg total) by mouth daily with supper.               Discharge Care Instructions  (From admission, onward)           Start     Ordered   02/13/24 0000  Discharge wound care:       Comments: Follow-up vascular surgery as scheduled.   02/13/24 0959            Follow-up Information     [facility] Follow up in 2 week(s).   Specialty: Vascular Surgery Why: Office will call to arrange your appt(s) Contact information: [address] [place]   [address] [phone]        [name], MD Follow up in 1 week(s).   Specialty: Family Medicine Contact information: [address] [place] [address] [phone]                No Known Allergies  Consultations: Vascular surgery   Procedures/Studies: HYBRID OR IMAGING ([facility] ONLY) Result Date: 02/09/2024 There is no interpretation for this exam.  This order is for images obtained during a surgical procedure.  Please See Surgeries Tab for more information regarding the procedure.   VAS US  LOWER EXTREMITY VENOUS (DVT) Result Date: 02/09/2024  Lower Venous DVT Study Patient Name:  Douglas Hunter  Date of Exam:  02/09/2024 Medical Rec #: [id number]         Accession #:    [id number] Date of Birth: 04/05/1955         Patient Gender: M Patient Age:   68 years Exam Location:  [address] Procedure:      VAS US  LOWER EXTREMITY VENOUS (DVT) Referring Phys: RASHID JANJUA --------------------------------------------------------------------------------  Indications: Pain. Other Indications: Patient states left foot pain since brain surgery a few weeks                    ago. Feels better with warm blanket. Left leg is cold with                    pallor. Performing Technologist: King Pierre RVT Supporting Technologist: Stoney Ross RVT  Examination Guidelines: A complete evaluation includes B-mode imaging, spectral Doppler, color Doppler, and power Doppler as needed of all accessible portions of each vessel. Bilateral testing is considered an integral part of a complete examination. Limited examinations for reoccurring indications may be performed as noted. The reflux portion of the exam is performed with the patient in reverse Trendelenburg.  +---------+---------------+---------+-----------+----------+--------------+ LEFT     CompressibilityPhasicitySpontaneityPropertiesThrombus Aging +---------+---------------+---------+-----------+----------+--------------+ CFV       Full           Yes      Yes                                 +---------+---------------+---------+-----------+----------+--------------+ SFJ      Full                    Yes                                 +---------+---------------+---------+-----------+----------+--------------+ FV Prox  Full           Yes      Yes                                 +---------+---------------+---------+-----------+----------+--------------+ FV Mid   Full           Yes      Yes                                 +---------+---------------+---------+-----------+----------+--------------+ FV DistalFull           Yes      Yes                                 +---------+---------------+---------+-----------+----------+--------------+ POP      Full           Yes      Yes                                 +---------+---------------+---------+-----------+----------+--------------+ PTV      Full                    Yes                                 +---------+---------------+---------+-----------+----------+--------------+  PERO     Full                    Yes                                 +---------+---------------+---------+-----------+----------+--------------+  Findings reported to Dr. Junjua via RN at 12:50. Patient referred to ED.  Summary: LEFT: - There is no evidence of deep vein thrombosis in the lower extremity.  - Incidental finding of left femoral artery, popliteal artery, posterior tibial and peroneal artery occlusion. Dr. Sherl requested patient be sent to the ED.  *See table(s) above for measurements and observations. Electronically signed by Norman Serve on 02/09/2024 at 1:11:05 PM.    Final    MR BRAIN W WO CONTRAST Result Date: 01/30/2024 EXAM: MRI BRAIN WITH AND WITHOUT CONTRAST 01/29/2024 11:55:00 PM TECHNIQUE: Multiplanar multisequence MRI of the head/brain was performed with and without the administration of 10 mL gadobutrol (GADAVIST) 1 MMOL/ML injection.  COMPARISON: Comparison with prior MRI from 01/24/2024. CLINICAL HISTORY: Postop. Table formatting from the original note was not included.; Postop Postop. Table formatting from the original note was not included.; Postop FINDINGS: BRAIN AND VENTRICLES: Postoperative changes from interval left temporal craniotomy for tumor resection. Small postoperative extra-axial collection subjacent to the craniotomy bone flap measures up to approximately 1 cm in maximal diameter without significant mass effect (series 5, image 12). Postoperative blood products present throughout the resection cavity within the left temporal lobe. The previously seen left temporal tumor has largely been resected. A 5 mm ring enhancing focus at the posterior margin of the resection cavity is suspicious for a small focus of residual tumor (series 10, image 23). Otherwise, no evidence of visible residual tumor is evident by MRI. Persistent surrounding FLAIR signal abnormality, consistent with vasogenic edema, is mildly improved from the prior exam. A 1.9 cm linear focus of restricted diffusion extending from the resection cavity to the atrium of the left lateral ventricle is consistent with a small peri-resection infarct (series 3, image 27). No associated hemorrhage or mass effect. No acute intracranial hemorrhage. No midline shift. No hydrocephalus. The sella is unremarkable. Normal flow voids. ORBITS: No acute abnormality. SINUSES: No acute abnormality. BONES AND SOFT TISSUES: Normal bone marrow signal and enhancement. No acute soft tissue abnormality. IMPRESSION: 1. Postoperative changes from interval left temporal craniotomy for tumor resection. 5 mm ring-enhancing focus at the posterior margin of the resection cavity, suspicious for a small focus of residual tumor. Otherwise, no other visible residual tumor evident by MRI. 2. 1.9 cm linear focus of restricted diffusion extending from the resection cavity to the atrium of the left lateral  ventricle, consistent with a small peri-resection infarct. 3. Small postoperative extra-axial collection subjacent to the craniotomy bone flap measuring up to 1 cm in diameter without significant mass effect. 4. Mild interval improvement in FLAIR signal abnormality involving the left frontotemporal region. Electronically signed by: Morene Hoard MD 01/30/2024 03:20 AM EDT RP Workstation: [id number]   MR Brain W and Wo Contrast Result Date: 01/24/2024 EXAM: MRI BRAIN WITH AND WITHOUT CONTRAST 01/24/2024 06:00:25 PM TECHNIQUE: Multiplanar multisequence MRI of the head/brain was performed with and without the administration of 10 mL gadobutrol (GADAVIST) 1 MMOL/ML injection 10 mL GADOBUTROL 1 MMOL/ML IV SOLN. COMPARISON: None available. CLINICAL HISTORY: edema and suspected mass on CT. edema and suspected mass on CT FINDINGS: BRAIN AND VENTRICLES: There is a 4.3 x 3.5  x 2.7 cm heterogeneous enhancing mass centered in the left temporal lobe corresponding to findings on same day CT. Surrounding vasogenic edema throughout the left temporal lobe extending partially into the inferior left parietal lobe as well as with extension into the posterior limb of the left internal capsule and the left external capsule. Mass effect and sulcal effacement throughout the left temporal lobe. There is significant effacement of the atrium and posterior body of the left lateral ventricle. There is up to 3 mm rightward midline shift. There are few punctate areas of susceptibility along the medial margin of the temporal lobe mass. Minimal scattered white matter signal abnormality suggestive of chronic microvascular ischemic changes. No acute infarct. No acute intracranial hemorrhage. No hydrocephalus. The sella is unremarkable. Normal flow voids. ORBITS: No acute abnormality. SINUSES: Mild mucosal thickening in the ethmoid sinuses and left maxillary sinus. BONES AND SOFT TISSUES: Normal bone marrow signal and enhancement. No acute  soft tissue abnormality. IMPRESSION: 1. Enhancing left temporal lobe mass measuring 4.3 x 3.5 x 2.7 cm with surrounding vasogenic edema, concerning for metastatic disease versus primary CNS neoplasm. 2. Mass effect with significant effacement of the atrium and posterior body of the left lateral ventricle. 3 mm rightward midline shift. Electronically signed by: Donnice Mania MD 01/24/2024 06:20 PM EDT RP Workstation: [id number]   DG Chest 2 View Result Date: 01/24/2024 CLINICAL DATA:  Cough, altered level of consciousness EXAM: CHEST - 2 VIEW COMPARISON:  03/13/2020 FINDINGS: Frontal and lateral views of the chest demonstrate an unremarkable cardiac silhouette. No acute airspace disease, effusion, or pneumothorax. No acute bony abnormalities. IMPRESSION: 1. No acute intrathoracic process. Electronically Signed   By: Ozell Daring M.D.   On: 01/24/2024 15:16   CT HEAD WO CONTRAST Result Date: 01/24/2024 CLINICAL DATA:  Altered level of consciousness EXAM: CT HEAD WITHOUT CONTRAST TECHNIQUE: Contiguous axial images were obtained from the base of the skull through the vertex without intravenous contrast. RADIATION DOSE REDUCTION: This exam was performed according to the departmental dose-optimization program which includes automated exposure control, adjustment of the mA and/or kV according to patient size and/or use of iterative reconstruction technique. COMPARISON:  06/28/2009 FINDINGS: Brain: There is extensive vasogenic edema throughout the left temporal lobe, with overlying sulcal effacement and mass effect upon the left lateral ventricle. 3 mm of rightward midline shift noted at the level of the septum pellucidum. There is a 2.4 cm hypodense masslike area within the left temporal lobe image 13/2, which could reflect cystic brain neoplasm. Further evaluation with MRI is recommended. No evidence of acute infarct or hemorrhage. The lateral ventricles and midline structures are otherwise unremarkable. There  are no acute extra-axial fluid collections. Vascular: No hyperdense vessel or unexpected calcification. Skull: Normal. Negative for fracture or focal lesion. Sinuses/Orbits: No acute finding. Other: None. IMPRESSION: 1. Large region of vasogenic edema throughout the left temporal lobe, with mild mass effect and 3 mm of right word midline shift as above. Suspected 2.4 cm cystic mass in the left temporal lobe, for which MRI brain with and without IV contrast is recommended. 2. No evidence of acute infarct or hemorrhage. Critical Value/emergent results were called by telephone at the time of interpretation on 01/24/2024 at 3:14 pm to provider [facility] , who verbally acknowledged these results. Electronically Signed   By: Ozell Daring M.D.   On: 01/24/2024 15:16    Subjective: Patient was seen and examined at bedside.  Overnight events noted. Patient reports feeling much better and he wants to  be discharged home.  Discharge Exam: Vitals:   02/13/24 0500 02/13/24 0731  BP:  128/85  Pulse: 61 (!) 105  Resp: 18 (!) 21  Temp:  (!) 97.3 F (36.3 C)  SpO2: 99% 99%   Vitals:   02/12/24 2330 02/13/24 0328 02/13/24 0500 02/13/24 0731  BP: (!) 144/75 (!) 140/70  128/85  Pulse: (!) 57 67 61 (!) 105  Resp: 17 17 18  (!) 21  Temp: 97.6 F (36.4 C) 97.6 F (36.4 C)  (!) 97.3 F (36.3 C)  TempSrc: Oral Oral  Oral  SpO2: 99% 98% 99% 99%  Weight:   109.4 kg   Height:        General: Pt is alert, awake, not in acute distress Cardiovascular: RRR, S1/S2 +, no rubs, no gallops Respiratory: CTA bilaterally, no wheezing, no rhonchi Abdominal: Soft, NT, ND, bowel sounds + Extremities: no edema, no cyanosis    The results of significant diagnostics from this hospitalization (including imaging, microbiology, ancillary and laboratory) are listed below for reference.     Microbiology: No results found for this or any previous visit (from the past 240 hours).   Labs: BNP (last 3 results) No  results for input(s): BNP in the last 8760 hours. Basic Metabolic Panel: Recent Labs  Lab 02/09/24 1347 02/09/24 1831 02/09/24 1903 02/10/24 0600 02/11/24 0259 02/12/24 0704 02/13/24 0328  NA 135   < > 137 133* 135 137 133*  K 4.6   < > 4.6 5.0 4.8 4.6 4.2  CL 106  --   --  104 105 105 106  CO2 18*  --   --  16* 20* 22 19*  GLUCOSE 192*  --   --  233* 229* 94 143*  BUN 30*  --   --  31* 42* 43* 36*  CREATININE 1.28*  --   --  1.47* 1.52* 1.36* 0.98  CALCIUM  7.9*  --   --  8.4* 8.6* 8.7* 8.4*  MG  --   --   --   --  2.1 2.2  --   PHOS  --   --   --   --  3.9 3.5  --    < > = values in this interval not displayed.   Liver Function Tests: Recent Labs  Lab 02/10/24 0600  AST 17  ALT 25  ALKPHOS 76  BILITOT 1.5*  PROT 5.5*  ALBUMIN  2.9*   No results for input(s): LIPASE, AMYLASE in the last 168 hours. No results for input(s): AMMONIA in the last 168 hours. CBC: Recent Labs  Lab 02/09/24 1347 02/09/24 1831 02/09/24 1903 02/10/24 0600 02/11/24 0259 02/12/24 0704 02/13/24 0328  WBC 18.0*  --   --  21.8* 17.7* 16.9* 14.9*  NEUTROABS 16.4*  --   --   --   --   --   --   HGB 14.6   < > 11.2* 11.4* 10.7* 10.4* 10.1*  HCT 44.5   < > 33.0* 33.8* 32.1* 31.0* 30.2*  MCV 93.5  --   --  92.6 93.9 93.1 93.5  PLT 260  --   --  227 209 212 199   < > = values in this interval not displayed.   Cardiac Enzymes: No results for input(s): CKTOTAL, CKMB, CKMBINDEX, TROPONINI in the last 168 hours. BNP: Invalid input(s): POCBNP CBG: Recent Labs  Lab 02/12/24 1145 02/12/24 1621 02/12/24 1703 02/12/24 2131 02/13/24 0613  GLUCAP 168* 222* 228* 307* 83   D-Dimer No results  for input(s): DDIMER in the last 72 hours. Hgb A1c No results for input(s): HGBA1C in the last 72 hours. Lipid Profile No results for input(s): CHOL, HDL, LDLCALC, TRIG, CHOLHDL, LDLDIRECT in the last 72 hours. Thyroid  function studies No results for input(s): TSH,  T4TOTAL, T3FREE, THYROIDAB in the last 72 hours.  Invalid input(s): FREET3 Anemia work up No results for input(s): VITAMINB12, FOLATE, FERRITIN, TIBC, IRON, RETICCTPCT in the last 72 hours. Urinalysis    Component Value Date/Time   COLORURINE STRAW (A) 01/24/2024 1426   APPEARANCEUR CLEAR (A) 01/24/2024 1426   APPEARANCEUR Clear 05/29/2020 0818   LABSPEC 1.021 01/24/2024 1426   LABSPEC 1.005 03/02/2014 1416   PHURINE 6.0 01/24/2024 1426   GLUCOSEU >=500 (A) 01/24/2024 1426   GLUCOSEU 300 mg/dL 88/70/7984 8583   HGBUR NEGATIVE 01/24/2024 1426   BILIRUBINUR NEGATIVE 01/24/2024 1426   BILIRUBINUR Negative 05/29/2020 0818   BILIRUBINUR NEGATIVE 03/02/2014 1416   KETONESUR NEGATIVE 01/24/2024 1426   PROTEINUR NEGATIVE 01/24/2024 1426   NITRITE NEGATIVE 01/24/2024 1426   LEUKOCYTESUR NEGATIVE 01/24/2024 1426   LEUKOCYTESUR NEGATIVE 03/02/2014 1416   Sepsis Labs Recent Labs  Lab 02/10/24 0600 02/11/24 0259 02/12/24 0704 02/13/24 0328  WBC 21.8* 17.7* 16.9* 14.9*   Microbiology No results found for this or any previous visit (from the past 240 hours).   Time coordinating discharge: Over 30 minutes  SIGNED:   Darcel Dawley, MD  [facility] 02/13/2024, 3:22 PM Pager   If 7PM-7AM, please contact night-coverage

## 2024-02-13 NOTE — Progress Notes (Addendum)
 PHARMACY - ANTICOAGULATION CONSULT NOTE  Pharmacy Consult for heparin  Indication: Ischemic limb, arterial occulsion  No Known Allergies  Patient Measurements: Height: 5' 11 (180.3 cm) Weight: 110.2 kg (242 lb 15.2 oz) IBW/kg (Calculated) : 75.3 HEPARIN  DW (KG): 98.9  Vital Signs: Temp: 97.6 F (36.4 C) (11/11 0328) Temp Source: Oral (11/11 0328) BP: 140/70 (11/11 0328) Pulse Rate: 67 (11/11 0328)  Labs: Recent Labs    02/10/24 0600 02/11/24 0259 02/11/24 1843 02/12/24 0704 02/12/24 1115 02/12/24 1715 02/13/24 0328  HGB 11.4* 10.7*  --  10.4*  --   --  10.1*  HCT 33.8* 32.1*  --  31.0*  --   --  30.2*  PLT 227 209  --  212  --   --  199  APTT 31  --  32 189* 157* 103* 100*  LABPROT 14.8  --   --   --   --   --   --   INR 1.1  --   --   --   --   --   --   HEPARINUNFRC  --   --  0.37 >1.10*  --   --  0.96*  CREATININE 1.47* 1.52*  --  1.36*  --   --  0.98    Estimated Creatinine Clearance: 89.9 mL/min (by C-G formula based on SCr of 0.98 mg/dL).   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  Now here with ischemic limb and arterial occlusion and pharmacy consulted to dose heparin .   -11/7: femoral thromboendarterectomy, thrombectomy and fasciotomy  -11/7: heparin  restarted but stopped later due to groin hematoma -11/9 heparin  restarted  AM: aPTT within goal (on higher end of goal) and heparin  level still falsely elevated given Xarelto  use on 1300 units/hr. Per RN, no signs/symptoms of bleeding including around heparin  infusion site. CBC shows Hgb low stable around 10s and plts 199.  Goal of Therapy:  Heparin  level 0.3-0.7 units/ml aPTT 66-102 seconds Monitor platelets by anticoagulation protocol: Yes   Plan:  -Reduce heparin  infusion to 1250 units/hr given still on  higher end of goal -Confirmatory aPTT in 8 hrs -Monitor with aPTT until correlates with heparin  level -CBC daily  Lynwood Poplar, PharmD, BCPS Clinical Pharmacist 02/13/2024 4:44 AM

## 2024-02-13 NOTE — Care Management Important Message (Signed)
 Important Message  Patient Details  Name: Douglas Hunter MRN: [id number] Date of Birth: 12-Oct-1955   Important Message Given:  Yes - Medicare IM     Douglas Hunter 02/13/2024, 11:45 AM

## 2024-02-13 NOTE — Discharge Instructions (Addendum)
 [facility]  Discharge instructions  Lower Extremity Surgery  Please refer to the following instruction for your post-procedure care. Your surgeon or physician assistant will discuss any changes with you.  Activity  You are encouraged to walk as much as you can. You can slowly return to normal activities during the month after your surgery. Avoid strenuous activity and heavy lifting until your doctor tells you it's OK. Avoid activities such as vacuuming or swinging a golf club. Do not drive until your doctor give the OK and you are no longer taking prescription pain medications. It is also normal to have difficulty with sleep habits, eating and bowel movement after surgery. These will go away with time.  Bathing/Showering  Shower daily after you go home. Do not soak in a bathtub, hot tub, or swim until the incision heals completely.  Incision Care  Clean your incision with mild soap and water. Shower every day. Pat the area dry with a clean towel. You do not need a bandage unless otherwise instructed. Do not apply any ointments or creams to your incision. If you have open wounds you will be instructed how to care for them or a visiting nurse may be arranged for you. If you have staples or sutures along your incision they will be removed at your post-op appointment. You may have skin glue on your incision. Do not peel it off. It will come off on its own in about one week.  Wash the groin wound with soap and water daily and pat dry. (No tub bath-only shower)  Then put a dry gauze or washcloth in the groin to keep this area dry to help prevent wound infection.  Do this daily and as needed.  Do not use Vaseline or neosporin on your incisions.  Only use soap and water on your incisions and then protect and keep dry.  Diet  Resume your normal diet. There are no special food restrictions following this procedure. A low fat/ low cholesterol diet is recommended for  all patients with vascular disease. In order to heal from your surgery, it is CRITICAL to get adequate nutrition. Your body requires vitamins, minerals, and protein. Vegetables are the best source of vitamins and minerals. Vegetables also provide the perfect balance of protein. Processed food has little nutritional value, so try to avoid this.  Medications  Resume taking all your medications unless your doctor or physician assistant tells you not to. If your incision is causing pain, you may take over-the-counter pain relievers such as acetaminophen (Tylenol). If you were prescribed a stronger pain medication, please aware these medication can cause nausea and constipation. Prevent nausea by taking the medication with a snack or meal. Avoid constipation by drinking plenty of fluids and eating foods with high amount of fiber, such as fruits, vegetables, and grains. Take Colace 100 mg (an over-the-counter stool softener) twice a day as needed for constipation.  Do not take Tylenol if you are taking prescription pain medications.  Follow Up  Our office will schedule a follow up appointment 2-3 weeks following discharge.  Please call us immediately for any of the following conditions  Severe or worsening pain in your legs or feet while at rest or while walking  Increased pain, redness, warmth, or drainage (pus) from your incision site(s) Fever of 101 degree or higher   Reduce your risk of vascular disease  Stop smoking. If you would like help call [facility] at 1-800-QUIT-NOW (407-713-9373) or [facility]  at [phone].  Manage your cholesterol Maintain a desired weight Control your diabetes weight Control your diabetes Keep your blood pressure down  If you have any questions, please call the office at [phone]

## 2024-02-13 NOTE — Progress Notes (Signed)
 Progress Note    02/13/2024 8:28 AM 4 Days Post-Op  Subjective: No complaints, wants to go home    Vitals:   02/13/24 0500 02/13/24 0731  BP:  128/85  Pulse: 61 (!) 105  Resp: 18 (!) 21  Temp:  (!) 97.3 F (36.3 C)  SpO2: 99% 99%    Physical Exam: General: Sitting on the side of the bed, NAD Lungs: Nonlabored Incisions: Left groin incision intact and dry with staples, no firm hematoma.  LLE incisions bandaged and dry Extremities: Intact motor and sensation of left foot.  Monophasic left peroneal Doppler signal above the ankle   CBC    Component Value Date/Time   WBC 14.9 (H) 02/13/2024 0328   RBC 3.23 (L) 02/13/2024 0328   HGB 10.1 (L) 02/13/2024 0328   HGB 13.8 06/28/2023 0946   HCT 30.2 (L) 02/13/2024 0328   HCT 41.7 06/28/2023 0946   PLT 199 02/13/2024 0328   PLT 232 06/28/2023 0946   MCV 93.5 02/13/2024 0328   MCV 93 06/28/2023 0946   MCV 95 03/02/2014 1416   MCH 31.3 02/13/2024 0328   MCHC 33.4 02/13/2024 0328   RDW 13.0 02/13/2024 0328   RDW 12.3 06/28/2023 0946   RDW 12.8 03/02/2014 1416   LYMPHSABS 0.9 02/09/2024 1347   LYMPHSABS 2.0 10/26/2016 1423   LYMPHSABS 1.5 03/02/2014 1416   MONOABS 0.6 02/09/2024 1347   MONOABS 1.3 (H) 03/02/2014 1416   EOSABS 0.0 02/09/2024 1347   EOSABS 0.3 10/26/2016 1423   EOSABS 0.3 03/02/2014 1416   BASOSABS 0.0 02/09/2024 1347   BASOSABS 0.0 10/26/2016 1423   BASOSABS 0.1 03/02/2014 1416    BMET    Component Value Date/Time   NA 133 (L) 02/13/2024 0328   NA 140 06/28/2023 0946   NA 131 (L) 03/02/2014 1416   K 4.2 02/13/2024 0328   K 4.1 03/02/2014 1416   CL 106 02/13/2024 0328   CL 98 03/02/2014 1416   CO2 19 (L) 02/13/2024 0328   CO2 24 03/02/2014 1416   GLUCOSE 143 (H) 02/13/2024 0328   GLUCOSE 374 (H) 03/02/2014 1416   BUN 36 (H) 02/13/2024 0328   BUN 26 06/28/2023 0946   BUN 18 03/02/2014 1416   CREATININE 0.98 02/13/2024 0328   CREATININE 1.27 03/02/2014 1416   CALCIUM  8.4 (L) 02/13/2024  0328   CALCIUM  8.7 03/02/2014 1416   GFRNONAA >60 02/13/2024 0328   GFRNONAA >60 03/02/2014 1416   GFRNONAA >60 01/12/2013 0013   GFRAA 53 (L) 02/20/2020 0925   GFRAA >60 03/02/2014 1416   GFRAA >60 01/12/2013 0013    INR    Component Value Date/Time   INR 1.1 02/10/2024 0600   INR 1.1 01/11/2013 1615     Intake/Output Summary (Last 24 hours) at 02/13/2024 0828 Last data filed at 02/13/2024 9487 Gross per 24 hour  Intake 707.92 ml  Output --  Net 707.92 ml      Assessment/Plan:  68 y.o. male is 4 days postop, s/p: Left common femoral thromboendarterectomy with bovine pericardial patch angioplasty, left lower extremity thrombectomy, left PT thrombectomy, left lower extremity 4 compartment fasciotomies, LLE angio    - He feels great this morning without any complaints.  He says he walked the halls several times yesterday with a rolling walker -No further bleeding events on heparin  drip.  Left groin incision is intact and dry.  All bruising remains soft -Left lower extremity incisions are bandaged and dry -He continues to have intact motor and  sensation of the left lower leg and foot.  He has a monophasic left peroneal Doppler signal above the ankle. -Okay to discontinue heparin  drip today and restart home Xarelto  -Stable for discharge home today from vascular perspective.  We will arrange close follow-up in 2 weeks for incision check   Ahmed Holster, PA-C [facility] [phone] 02/13/2024 8:28 AM

## 2024-02-14 ENCOUNTER — Other Ambulatory Visit: Payer: Self-pay

## 2024-02-14 DIAGNOSIS — G9389 Other specified disorders of brain: Secondary | ICD-10-CM

## 2024-02-15 ENCOUNTER — Telehealth: Payer: Self-pay

## 2024-02-15 ENCOUNTER — Ambulatory Visit: Admitting: Speech Pathology

## 2024-02-15 NOTE — Telephone Encounter (Signed)
 Pt's wife, [name], called concerned about pt's left foot s/p Lt Fem to PT thrombectomy. [name] reported heal pain and a hard time walking.  She also reported swelling and possibly a cut on the back of heel but she wasn't sure if it a deep crevice from dry skin.  Advised her to send pictures through MyChart. [name] also reported the foot is warm and has normal color.  Advise patient to elevate the leg above heart level and wrap with moderate compression from toes to knee.   Advised to clean wounds daily with antibacterial soap and water and to dry before wrapping with compression Advised pt to ambulate as much as tolerated.  Pt knows to call back if pain becomes intolerable, redness, purulent drainage or fever develop. Pt knows to call back if foot becomes cool and pale.

## 2024-02-16 ENCOUNTER — Encounter (HOSPITAL_COMMUNITY): Payer: Self-pay

## 2024-02-20 ENCOUNTER — Ambulatory Visit: Admitting: Speech Pathology

## 2024-02-21 ENCOUNTER — Telehealth: Payer: Self-pay

## 2024-02-21 ENCOUNTER — Ambulatory Visit: Attending: Surgery | Admitting: Physician Assistant

## 2024-02-21 ENCOUNTER — Encounter (HOSPITAL_COMMUNITY): Payer: Self-pay

## 2024-02-21 ENCOUNTER — Ambulatory Visit (HOSPITAL_BASED_OUTPATIENT_CLINIC_OR_DEPARTMENT_OTHER)
Admission: RE | Admit: 2024-02-21 | Discharge: 2024-02-21 | Disposition: A | Discharge status: Home / Self Care | Source: Ambulatory Visit | Attending: Surgery | Admitting: Surgery

## 2024-02-21 ENCOUNTER — Ambulatory Visit (HOSPITAL_COMMUNITY)
Admission: RE | Admit: 2024-02-21 | Discharge: 2024-02-21 | Disposition: A | Discharge status: Home / Self Care | Source: Ambulatory Visit | Attending: Surgery | Admitting: Surgery

## 2024-02-21 ENCOUNTER — Other Ambulatory Visit: Payer: Self-pay | Admitting: Vascular Surgery

## 2024-02-21 VITALS — BP 146/76 | HR 74 | Temp 97.9°F | Wt 241.0 lb

## 2024-02-21 DIAGNOSIS — I998 Other disorder of circulatory system: Secondary | ICD-10-CM

## 2024-02-21 MED ORDER — OXYCODONE-ACETAMINOPHEN 5-325 MG PO TABS: 1.0000 | ORAL_TABLET | ORAL | 0 refills | Status: DC | PRN

## 2024-02-21 NOTE — Telephone Encounter (Signed)
 Patient called c/o continuing pain to left foot heel and toes.  Also reporting rest pain.  Appts made for ABI and Lt LEA + PA

## 2024-02-21 NOTE — Progress Notes (Signed)
 POST OPERATIVE OFFICE NOTE    CC:  F/u for surgery  HPI:  This is a 68 y.o. male who is s/p left leg thrombectomy with common femoral artery endarterectomy and bovine patch angioplasty and 4 compartment fasciotomies by Dr. Magda on 02/09/2024 due to acute limb ischemia of the left lower extremity.  During his hospital stay he maintained a dopplerable left peroneal signal.  He was discharged on Xarelto .  He was added on as a urgent triage visit today due to severe pain in the left heel and foot.  He states the pain is better when walking or moving around however worsens when he is sleeping and elevating his leg during the day.  He denies tobacco use.  He is accompanied today by his wife.  No Known Allergies  Current Outpatient Medications  Medication Sig Dispense Refill   oxyCODONE -acetaminophen  (PERCOCET/ROXICET) 5-325 MG tablet Take 1 tablet by mouth every 4 (four) hours as needed for severe pain (pain score 7-10). 30 tablet 0   acetaminophen  (TYLENOL ) 500 MG tablet Take 500 mg by mouth 4 (four) times daily as needed for mild pain or moderate pain.     atorvastatin  (LIPITOR ) 80 MG tablet Take 80 mg by mouth at bedtime.     baclofen  (LIORESAL ) 10 MG tablet Take 1 tablet (10 mg total) by mouth 2 (two) times daily as needed (neck pain, muscle spasms). 60 each 0   benazepril  (LOTENSIN ) 40 MG tablet TAKE 1 TABLET BY MOUTH ONCE DAILY (Patient taking differently: Take 40 mg by mouth daily.) 90 tablet 3   butalbital-acetaminophen -caffeine (FIORICET) 50-325-40 MG tablet Take 2 tablets by mouth every 4 (four) hours as needed for headache (Moderate pain). 14 tablet 0   cholecalciferol  (VITAMIN D3) 25 MCG (1000 UNIT) tablet Take 500 Units by mouth daily.     Continuous Blood Gluc Sensor (FREESTYLE LIBRE 14 DAY SENSOR) MISC SMARTSIG:1 Kit(s) Topical Every 2 Weeks     dexamethasone  (DECADRON ) 2 MG tablet Take 1 tablet (2 mg total) by mouth daily. 30 tablet 0   dofetilide  (TIKOSYN ) 500 MCG capsule TAKE 1  CAPSULE BY MOUTH TWICE DAILY 180 capsule 2   ezetimibe  (ZETIA ) 10 MG tablet TAKE 1 TABLET BY MOUTH ONCE DAILY (Patient taking differently: Take 10 mg by mouth daily. TAKE 1 TABLET BY MOUTH ONCE DAILY) 90 tablet 3   furosemide  (LASIX ) 20 MG tablet TAKE 1 TABLET BY MOUTH TWICE DAILY AS NEEDED 180 tablet 0   insulin  glargine-yfgn (SEMGLEE ) 100 UNIT/ML injection Inject 0.5 mLs (50 Units total) into the skin 2 (two) times daily. 10 mL 11   insulin  lispro (HUMALOG ) 100 UNIT/ML KiwkPen Inject 8-12 Units into the skin 2 (two) times daily. Sliding Scale     INSULIN  SYRINGE 1CC/29G 29G X 1/2 1 ML MISC USE AS DIRECTED 100 each PRN   JARDIANCE 25 MG TABS tablet Take 12.5 mg by mouth daily.     levETIRAcetam (KEPPRA) 500 MG tablet Take 1 tablet (500 mg total) by mouth 2 (two) times daily. 60 tablet 0   metoprolol  succinate (TOPROL -XL) 25 MG 24 hr tablet TAKE 1 TABLET BY MOUTH ONCE DAILY 90 tablet 1   nitroGLYCERIN  (NITROSTAT ) 0.4 MG SL tablet Place 1 tablet (0.4 mg total) under the tongue every 5 (five) minutes as needed for chest pain. 25 tablet 1   ONE TOUCH ULTRA TEST test strip TEST THREE TIMES A DAY AS DIRECTED. 100 each 12   OVER THE COUNTER MEDICATION Take 1 capsule by mouth 2 (two)  times daily. Omega XL     pantoprazole  (PROTONIX ) 40 MG tablet Take 1 tablet (40 mg total) by mouth daily. Stop taking if no longer on decadron . 30 tablet 3   rivaroxaban  (XARELTO ) 20 MG TABS tablet Take 1 tablet (20 mg total) by mouth daily with supper. 30 tablet 3   No current facility-administered medications for this visit.   Facility-Administered Medications Ordered in Other Visits  Medication Dose Route Frequency Provider Last Rate Last Admin   sodium chloride  flush (NS) 0.9 % injection 3 mL  3 mL Intravenous Q12H Visser, Jacquelyn D, PA-C         ROS:  See HPI  Physical Exam:  Vitals:   02/21/24 1231  BP: (!) 146/76  Pulse: 74  Temp: 97.9 F (36.6 C)  TempSrc: Temporal  Weight: 241 lb (109.3 kg)     Incision: Left groin incision well-appearing; fullness but no firm hematoma; surrounding ecchymosis Fasciotomy incisions with minimal thin serous drainage with healthy appearing wound bed; PT cutdown incision with viable skin edges; skin cracking left heel with cool to touch distal foot; sensation and motor intact left foot Extremities: Brisk left AT and peroneal signal by Doppler Neuro: A&O  All tibial vessels occluded on duplex Toe pressure of 0  Assessment/Plan:  This is a 68 y.o. male who is s/p: Left leg thrombectomy with femoral endarterectomy with bovine patch angioplasty and 4 compartment fasciotomy due to acute limb ischemia  Mr. Engram is a 68 year old male who presented to the emergency department with acute limb ischemia of the left lower extremity.  He was brought to the operating room and underwent the above procedure.  He is complaining of rest pain since he has left the hospital.  He was made aware by Dr. 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  Surprisingly he has brisk AT and peroneal signals by Doppler on exam.  Will discuss case with Dr. Magda however I do not believe he has any further options for revascularization.  He was prescribed Percocet for continued postoperative pain control.  If he is unable to tolerate this level of pain we will likely need to proceed with leg amputation.  He will keep his appointment in 2 weeks.  He will notify the office with any questions or concerns in the meantime.   Donnice Sender, PA-C [facility] [phone]  Clinic MD:  Gretta on call

## 2024-02-22 ENCOUNTER — Encounter

## 2024-02-23 ENCOUNTER — Encounter (HOSPITAL_COMMUNITY): Payer: Self-pay

## 2024-02-26 ENCOUNTER — Ambulatory Visit: Admitting: Speech Pathology

## 2024-02-27 ENCOUNTER — Other Ambulatory Visit: Payer: Self-pay | Admitting: Cardiovascular Disease

## 2024-02-27 LAB — SURGICAL PATHOLOGY

## 2024-02-28 ENCOUNTER — Ambulatory Visit: Admitting: Cardiology

## 2024-02-28 ENCOUNTER — Ambulatory Visit: Admitting: Speech Pathology

## 2024-02-28 ENCOUNTER — Telehealth: Payer: Self-pay

## 2024-02-28 NOTE — Telephone Encounter (Signed)
 Patient's wife called the office to say that he will be out of Keppra  and protonix  on Sunday and doesn't not have an appt until Tuesday and asked if a prescription can be called in.   If so she would like for them to go to [facility] in [place] and requested I give her a call after discussing with Dr. Janjua.

## 2024-03-04 ENCOUNTER — Other Ambulatory Visit: Payer: Self-pay

## 2024-03-04 ENCOUNTER — Telehealth: Payer: Self-pay | Admitting: Pharmacy Technician

## 2024-03-04 ENCOUNTER — Ambulatory Visit: Admitting: Speech Pathology

## 2024-03-04 ENCOUNTER — Telehealth: Payer: Self-pay | Admitting: Pharmacist

## 2024-03-04 ENCOUNTER — Other Ambulatory Visit (HOSPITAL_COMMUNITY): Payer: Self-pay

## 2024-03-04 ENCOUNTER — Ambulatory Visit
Admission: RE | Admit: 2024-03-04 | Discharge: 2024-03-04 | Disposition: A | Source: Ambulatory Visit | Attending: Radiation Oncology | Admitting: Radiation Oncology

## 2024-03-04 ENCOUNTER — Encounter: Payer: Self-pay | Admitting: Radiation Oncology

## 2024-03-04 ENCOUNTER — Other Ambulatory Visit: Payer: Self-pay | Admitting: Internal Medicine

## 2024-03-04 VITALS — Ht 71.0 in | Wt 241.0 lb

## 2024-03-04 DIAGNOSIS — Z87891 Personal history of nicotine dependence: Secondary | ICD-10-CM | POA: Insufficient documentation

## 2024-03-04 DIAGNOSIS — C719 Malignant neoplasm of brain, unspecified: Secondary | ICD-10-CM | POA: Insufficient documentation

## 2024-03-04 DIAGNOSIS — Z7901 Long term (current) use of anticoagulants: Secondary | ICD-10-CM | POA: Insufficient documentation

## 2024-03-04 DIAGNOSIS — M79672 Pain in left foot: Secondary | ICD-10-CM | POA: Insufficient documentation

## 2024-03-04 DIAGNOSIS — Z9221 Personal history of antineoplastic chemotherapy: Secondary | ICD-10-CM | POA: Insufficient documentation

## 2024-03-04 DIAGNOSIS — I252 Old myocardial infarction: Secondary | ICD-10-CM | POA: Insufficient documentation

## 2024-03-04 DIAGNOSIS — Z79899 Other long term (current) drug therapy: Secondary | ICD-10-CM | POA: Insufficient documentation

## 2024-03-04 DIAGNOSIS — Z923 Personal history of irradiation: Secondary | ICD-10-CM | POA: Insufficient documentation

## 2024-03-04 DIAGNOSIS — Z801 Family history of malignant neoplasm of trachea, bronchus and lung: Secondary | ICD-10-CM | POA: Insufficient documentation

## 2024-03-04 DIAGNOSIS — E1165 Type 2 diabetes mellitus with hyperglycemia: Secondary | ICD-10-CM | POA: Insufficient documentation

## 2024-03-04 DIAGNOSIS — G4730 Sleep apnea, unspecified: Secondary | ICD-10-CM | POA: Insufficient documentation

## 2024-03-04 DIAGNOSIS — I998 Other disorder of circulatory system: Secondary | ICD-10-CM | POA: Insufficient documentation

## 2024-03-04 DIAGNOSIS — I480 Paroxysmal atrial fibrillation: Secondary | ICD-10-CM | POA: Insufficient documentation

## 2024-03-04 DIAGNOSIS — Z79630 Long term (current) use of alkylating agent: Secondary | ICD-10-CM | POA: Insufficient documentation

## 2024-03-04 DIAGNOSIS — Z794 Long term (current) use of insulin: Secondary | ICD-10-CM | POA: Insufficient documentation

## 2024-03-04 DIAGNOSIS — I10 Essential (primary) hypertension: Secondary | ICD-10-CM | POA: Insufficient documentation

## 2024-03-04 DIAGNOSIS — E785 Hyperlipidemia, unspecified: Secondary | ICD-10-CM | POA: Insufficient documentation

## 2024-03-04 DIAGNOSIS — I2510 Atherosclerotic heart disease of native coronary artery without angina pectoris: Secondary | ICD-10-CM | POA: Insufficient documentation

## 2024-03-04 DIAGNOSIS — Z7952 Long term (current) use of systemic steroids: Secondary | ICD-10-CM | POA: Insufficient documentation

## 2024-03-04 MED ORDER — ONDANSETRON HCL 8 MG PO TABS
8.0000 mg | ORAL_TABLET | Freq: Three times a day (TID) | ORAL | 1 refills | Status: DC | PRN
  Filled 2024-03-04 – 2024-03-06 (×2): qty 30, 10d supply, fill #0

## 2024-03-04 MED ORDER — TEMOZOLOMIDE 140 MG PO CAPS
140.0000 mg | ORAL_CAPSULE | Freq: Every day | ORAL | 0 refills | Status: DC
  Filled 2024-03-05: qty 42, 42d supply, fill #0

## 2024-03-04 MED ORDER — TEMOZOLOMIDE 20 MG PO CAPS
20.0000 mg | ORAL_CAPSULE | Freq: Every day | ORAL | 0 refills | Status: DC
  Filled 2024-03-05: qty 42, 42d supply, fill #0

## 2024-03-04 MED ORDER — PANTOPRAZOLE SODIUM 40 MG PO TBEC: 40.0000 mg | DELAYED_RELEASE_TABLET | Freq: Every day | ORAL | 3 refills | Status: AC

## 2024-03-04 MED ORDER — LEVETIRACETAM 500 MG PO TABS: 500.0000 mg | ORAL_TABLET | Freq: Two times a day (BID) | ORAL | 0 refills | Status: DC

## 2024-03-04 NOTE — Telephone Encounter (Signed)
 Oral Oncology Patient Advocate Encounter  After completing a benefits investigation, prior authorization for Temodar 20 mg is not required at this time through [facility] part D.  Patient's copay is $42.92.     Douglas Hunter (Douglas Hunter) Douglas Hunter, CPhT  [facility], Douglas Hunter, [facility] - Oral Chemotherapy Patient Advocate Specialist III Phone: [phone]  Fax: [phone]

## 2024-03-04 NOTE — Telephone Encounter (Signed)
 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  CBC from 02/13/24 and BMP from 02/12/24 assessed, noted patient with Scr of 1.36 mg/dL (CrCl ~ 10fO/fpw). No baseline renal dose adjustments required for temozolomide. Prescription dose and frequency assessed for appropriateness.  Current medication list in Epic reviewed, no relevant/significant DDIs with Temodar identified.  Evaluated chart and no patient barriers to medication adherence noted.   Patient agreement for treatment documented in MD note on 02/08/24.  Prescription has been e-scribed to the [facility] for benefits analysis and approval.  Oral Oncology Clinic will continue to follow for insurance authorization, copayment issues, initial counseling and start date.  Douglas Hunter, PharmD, BCPS, BCOP Hematology/Oncology Clinical Pharmacist [phone] 03/04/2024 10:52 AM

## 2024-03-04 NOTE — Progress Notes (Signed)

## 2024-03-04 NOTE — Telephone Encounter (Signed)
 Oral Oncology Patient Advocate Encounter  After completing a benefits investigation, prior authorization for Temodar 140 mg is not required at this time through [facility] part D.  Patient's copay is $111.83.     Cendy Oconnor ([name]) [name], CPhT  [facility], [name], [facility] - Oral Chemotherapy Patient Advocate Specialist III Phone: [phone]  Fax: [phone]

## 2024-03-04 NOTE — Consult Note (Signed)
 NEW PATIENT EVALUATION  Name: Douglas Hunter  MRN: [id number]  Date:   03/04/2024     DOB: [birth year]   This 68 y.o. 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  REFERRING PHYSICIAN: Vaslow, Zachary K, MD  CHIEF COMPLAINT:  Chief Complaint  Patient presents with   Glioblastoma    DIAGNOSIS: The encounter diagnosis was Malignant neoplasm of brain, unspecified location [facility]).   PREVIOUS INVESTIGATIONS:  MRI scans reviewed Clinical notes reviewed Pathology report reviewed  HPI: Patient is a 68 year old male who had sudden onset of aphasia back in October.  Initial CT scan showed a large region of vasogenic edema throughout the left temporal lobe with mild mass effect and a 3 mm a right midline shift.  There is a suspected 2.4 cm cystic mass in left temporal lobe.  MRI of the brain confirmed a 4.3 x 3.5 x 2.7 cm enhancing left temporal lobe mass with surrounding vasogenic's edema.  There was mass effect with significant effacement of the atrium and posterior body of the left lateral ventricle.  Patient underwent craniotomy with resection on 1027 showing a grade 4 glioma.  1 IDH 2 mutations were not detected consistent with glioblastoma IDH wild-type.  Postoperative MRI showed postoperative changes from left temporal craniotomy with 5 mm ring-enhancing focus at the posterior margin of the resection suspicious for small focus of residual tumor otherwise no evidence of residual tumor was noted.  There is also a 1.9 cm linear focus of restricted diffusion extending from the resection cavity to the atrium of the left lateral ventricle consistent with a small PERI resection infarct.  Patient and early December had a Rutherford 2B acute limb ischemia of the left lower extremity and underwent a left lower extremity common femoral thromboendarterectomy and bovine pericardial patch angioplasty.  He has developed some severe pain in his left heel  and foot which is being addressed by vascular surgery.  His AT and peroneal signals by Doppler exam are brisk.  He is currently on narcotics for continued pain.  They have not ruled out possibility of leg amputation.  He is speech has returned to almost normal.  He is having no crude visual changes and no focal neurologic deficits.  Seen today for consideration of concurrent Temodar and IMRT radiation therapy  PLANNED TREATMENT REGIMEN: IMRT radiation therapy with concurrent Temodar  PAST MEDICAL HISTORY:  has a past medical history of AKI (acute kidney injury) (03/13/2020), Atrial flutter (HCC), Coronary artery disease (08/2011), GERD (gastroesophageal reflux disease), H/O brain tumor (01/2024), History of echocardiogram, Hyperlipidemia, Hypertension, Myocardial infarction [facility]), Paroxysmal atrial fibrillation (HCC), Poorly controlled diabetes mellitus (HCC), RBBB, and Sleep apnea.    PAST SURGICAL HISTORY:  Past Surgical History:  Procedure Laterality Date   ABLATION  01/29/2013   PVI and CTI by Dr Kelsie for atrial flutter and paroxysmal atrial fibrillation   ANTERIOR CERVICAL DECOMP/DISCECTOMY FUSION N/A 04/18/2018   Procedure: ANTERIOR CERVICAL DECOMPRESSION/DISCECTOMY FUSION 2 LEVELS C5-7;  Surgeon: Clois Fret, MD;  Location: [facility] ORS;  Service: Neurosurgery;  Laterality: N/A;   APPLICATION OF CRANIAL NAVIGATION Left 01/29/2024   Procedure: COMPUTER-ASSISTED NAVIGATION, FOR CRANIAL PROCEDURE;  Surgeon: Rosslyn Dino HERO, MD;  Location: [facility] OR;  Service: Neurosurgery;  Laterality: Left;   ATRIAL FIBRILLATION ABLATION N/A 01/29/2013   Procedure: ATRIAL FIBRILLATION ABLATION;  Surgeon: Lynwood JONETTA Kelsie, MD;  Location: [facility] CATH LAB;  Service: Cardiovascular;  Laterality: N/A;   ATRIAL FIBRILLATION ABLATION N/A 11/15/2021  Procedure: ATRIAL FIBRILLATION ABLATION;  Surgeon: Cindie Ole DASEN, MD;  Location: [facility] INVASIVE CV LAB;  Service: Cardiovascular;  Laterality: N/A;   CARDIAC  CATHETERIZATION  08/03/2011   COLONOSCOPY WITH PROPOFOL  N/A 02/02/2016   Procedure: COLONOSCOPY WITH PROPOFOL ;  Surgeon: Rogelia Copping, MD;  Location: [facility] ENDOSCOPY;  Service: Endoscopy;  Laterality: N/A;   COLONOSCOPY WITH PROPOFOL  N/A 04/20/2023   Procedure: COLONOSCOPY WITH PROPOFOL ;  Surgeon: Copping Rogelia, MD;  Location: [facility] ENDOSCOPY;  Service: Endoscopy;  Laterality: N/A;   CRANIOTOMY Left 01/29/2024   Procedure: CRANIOTOMY TUMOR EXCISION;  Surgeon: Rosslyn Dino HERO, MD;  Location: [facility] OR;  Service: Neurosurgery;  Laterality: Left;  LEFT TEMPORAL MASS RESCETION   ENDARTERECTOMY FEMORAL Left 02/09/2024   Procedure: LEFT FEMORAL ENDARTERECTOMY;  Surgeon: Magda Debby SAILOR, MD;  Location: [facility] OR;  Service: Vascular;  Laterality: Left;   FASCIECTOMY, LOWER EXTREMITY Left 02/09/2024   Procedure: 4 COMPARTMENT FASCIECTOMY, LEFT LOWER EXTREMITY;  Surgeon: Magda Debby SAILOR, MD;  Location: [facility] OR;  Service: Vascular;  Laterality: Left;   LOWER EXTREMITY ANGIOGRAM Left 02/09/2024   Procedure: INTRAOPERATIVE LEFT LOWER EXTREMITY ANGIOGRAM;  Surgeon: Magda Debby SAILOR, MD;  Location: [facility] OR;  Service: Vascular;  Laterality: Left;   LUMBAR LAMINECTOMY/DECOMPRESSION MICRODISCECTOMY N/A 06/07/2017   Procedure: LUMBAR LAMINECTOMY/DECOMPRESSION MICRODISCECTOMY 2 LEVELS-L3-4,L4-5;  Surgeon: Clois Fret, MD;  Location: [facility] ORS;  Service: Neurosurgery;  Laterality: N/A;   PATCH ANGIOPLASTY  02/09/2024   Procedure: ANGIOPLASTY LEFT FEMORAL ARTERY USING XENOSURE BOVINE PERICARDIUM PATCH;  Surgeon: Magda Debby SAILOR, MD;  Location: [facility] OR;  Service: Vascular;;   POLYPECTOMY  04/20/2023   Procedure: POLYPECTOMY;  Surgeon: Copping Rogelia, MD;  Location: [facility] ENDOSCOPY;  Service: Endoscopy;;   RIGHT/LEFT HEART CATH AND CORONARY ANGIOGRAPHY N/A 03/06/2020   Procedure: RIGHT/LEFT HEART CATH AND CORONARY ANGIOGRAPHY;  Surgeon: Perla Evalene PARAS, MD;  Location: [facility] INVASIVE CV LAB;  Service: Cardiovascular;  Laterality: N/A;   TEE  WITHOUT CARDIOVERSION N/A 01/28/2013   Procedure: TRANSESOPHAGEAL ECHOCARDIOGRAM (TEE);  Surgeon: Redell GORMAN Shallow, MD;  Location: [facility] ENDOSCOPY;  Service: Cardiovascular;  Laterality: N/A;   THROMBECTOMY OF BYPASS GRAFT FEMORAL- PERONEAL ARTERY Left 02/09/2024   Procedure: THROMBECTOMY OF LEFT FEMORAL AND POTERIOR TIBIAL  ARTERY;  Surgeon: Magda Debby SAILOR, MD;  Location: [facility] OR;  Service: Vascular;  Laterality: Left;    FAMILY HISTORY: family history includes Diabetes in his son; Heart attack in his maternal uncle; Heart attack (age of onset: 50) in his father; Heart disease in his mother and paternal grandfather; Stroke in his mother; Throat cancer in his father.  SOCIAL HISTORY:  reports that he quit smoking about 16 years ago. His smoking use included cigarettes. He started smoking about 51 years ago. He has a 52.5 pack-year smoking history. He has never used smokeless tobacco. He reports that he does not currently use alcohol. He reports that he does not use drugs.  ALLERGIES: Patient has no known allergies.  MEDICATIONS:  Current Outpatient Medications  Medication Sig Dispense Refill   acetaminophen  (TYLENOL ) 500 MG tablet Take 500 mg by mouth 4 (four) times daily as needed for mild pain or moderate pain.     atorvastatin  (LIPITOR ) 80 MG tablet Take 80 mg by mouth at bedtime.     benazepril  (LOTENSIN ) 40 MG tablet TAKE 1 TABLET BY MOUTH ONCE DAILY (Patient taking differently: Take 40 mg by mouth daily.) 90 tablet 3   butalbital -acetaminophen -caffeine  (FIORICET ) 50-325-40 MG tablet Take 2 tablets by mouth every 4 (four) hours as needed for  headache (Moderate pain). 14 tablet 0   cholecalciferol  (VITAMIN D3) 25 MCG (1000 UNIT) tablet Take 500 Units by mouth daily.     Continuous Blood Gluc Sensor (FREESTYLE LIBRE 14 DAY SENSOR) MISC SMARTSIG:1 Kit(s) Topical Every 2 Weeks     dexamethasone  (DECADRON ) 2 MG tablet Take 1 tablet (2 mg total) by mouth daily. 30 tablet 0   dofetilide  (TIKOSYN ) 500  MCG capsule TAKE 1 CAPSULE BY MOUTH TWICE DAILY 180 capsule 2   ezetimibe  (ZETIA ) 10 MG tablet TAKE 1 TABLET BY MOUTH ONCE DAILY (Patient taking differently: Take 10 mg by mouth daily. TAKE 1 TABLET BY MOUTH ONCE DAILY) 90 tablet 3   furosemide  (LASIX ) 20 MG tablet TAKE 1 TABLET BY MOUTH TWICE DAILY AS NEEDED 180 tablet 0   insulin  glargine-yfgn (SEMGLEE ) 100 UNIT/ML injection Inject 0.5 mLs (50 Units total) into the skin 2 (two) times daily. 10 mL 11   insulin  lispro (HUMALOG ) 100 UNIT/ML KiwkPen Inject 8-12 Units into the skin 2 (two) times daily. Sliding Scale     INSULIN  SYRINGE 1CC/29G 29G X 1/2 1 ML MISC USE AS DIRECTED 100 each PRN   JARDIANCE 25 MG TABS tablet Take 12.5 mg by mouth daily.     levETIRAcetam  (KEPPRA ) 500 MG tablet Take 1 tablet (500 mg total) by mouth 2 (two) times daily. 60 tablet 0   metoprolol  succinate (TOPROL -XL) 25 MG 24 hr tablet TAKE 1 TABLET BY MOUTH ONCE DAILY 90 tablet 1   nitroGLYCERIN  (NITROSTAT ) 0.4 MG SL tablet Place 1 tablet (0.4 mg total) under the tongue every 5 (five) minutes as needed for chest pain. 25 tablet 1   [START ON 03/13/2024] ondansetron  (ZOFRAN ) 8 MG tablet Take 1 tablet (8 mg total) by mouth every 8 (eight) hours as needed for nausea or vomiting. May take 30-60 minutes prior to Temodar administration if nausea/vomiting occurs as needed. 30 tablet 1   ONE TOUCH ULTRA TEST test strip TEST THREE TIMES A DAY AS DIRECTED. 100 each 12   OVER THE COUNTER MEDICATION Take 1 capsule by mouth 2 (two) times daily. Omega XL     oxyCODONE -acetaminophen  (PERCOCET/ROXICET) 5-325 MG tablet Take 1 tablet by mouth every 4 (four) hours as needed for severe pain (pain score 7-10). 30 tablet 0   pantoprazole  (PROTONIX ) 40 MG tablet Take 1 tablet (40 mg total) by mouth daily. Stop taking if no longer on decadron . 30 tablet 3   rivaroxaban  (XARELTO ) 20 MG TABS tablet Take 1 tablet (20 mg total) by mouth daily with supper. 30 tablet 3   [START ON 03/13/2024]  temozolomide (TEMODAR) 140 MG capsule Take 1 capsule (140 mg total) by mouth daily. May take on an empty stomach to decrease nausea & vomiting. 42 capsule 0   [START ON 03/13/2024] temozolomide (TEMODAR) 20 MG capsule Take 1 capsule (20 mg total) by mouth daily. May take on an empty stomach to decrease nausea & vomiting. 42 capsule 0   No current facility-administered medications for this encounter.   Facility-Administered Medications Ordered in Other Encounters  Medication Dose Route Frequency Provider Last Rate Last Admin   sodium chloride  flush (NS) 0.9 % injection 3 mL  3 mL Intravenous Q12H Visser, Jacquelyn D, PA-C        ECOG PERFORMANCE STATUS:  0 - Asymptomatic  REVIEW OF SYSTEMS: Patient denies any weight loss, fatigue, weakness, fever, chills or night sweats. Patient denies any loss of vision, blurred vision. Patient denies any ringing  of the ears or hearing  loss. No irregular heartbeat. Patient denies heart murmur or history of fainting. Patient denies any chest pain or pain radiating to her upper extremities. Patient denies any shortness of breath, difficulty breathing at night, cough or hemoptysis. Patient denies any swelling in the lower legs. Patient denies any nausea vomiting, vomiting of blood, or coffee ground material in the vomitus. Patient denies any stomach pain. Patient states has had normal bowel movements no significant constipation or diarrhea. Patient denies any dysuria, hematuria or significant nocturia. Patient denies any problems walking, swelling in the joints or loss of balance. Patient denies any skin changes, loss of hair or loss of weight. Patient denies any excessive worrying or anxiety or significant depression. Patient denies any problems with insomnia. Patient denies excessive thirst, polyuria, polydipsia. Patient denies any swollen glands, patient denies easy bruising or easy bleeding. Patient denies any recent infections, allergies or URI. Patient s visual  fields have not changed significantly in recent time.   PHYSICAL EXAM: Ht 5' 11 (1.803 m)   Wt 241 lb (109.3 kg)   BMI 33.61 kg/m  Crude visual fields are normal range.  Motor and sensory levels are equal symmetric in the upper lower extremities he is in significant pain on examination of his left lower extremity secondary to above-stated events.  Well-developed well-nourished patient in NAD. HEENT reveals PERLA, EOMI, discs not visualized.  Oral cavity is clear. No oral mucosal lesions are identified. Neck is clear without evidence of cervical or supraclavicular adenopathy. Lungs are clear to A&P. Cardiac examination is essentially unremarkable with regular rate and rhythm without murmur rub or thrill. Abdomen is benign with no organomegaly or masses noted. Motor sensory and DTR levels are equal and symmetric in the upper and lower extremities. Cranial nerves II through XII are grossly intact. Proprioception is intact. No peripheral adenopathy or edema is identified. No motor or sensory levels are noted. Crude visual fields are within normal range.  LABORATORY DATA: Pathology reports reviewed    RADIOLOGY RESULTS: MRI scans reviewed compatible with above-stated findings   IMPRESSION: GBM of the of the left temporal brain s/p resection and 68 year old male  PLAN: This time patient is continue follow-up with vascular surgery for issues with his acute limb ischemia of the left lower extremity.  I have recommended concurrent Temodar and radiation therapy.  I would plan on delivering 60 Gray over 6 weeks using IMRT treatment planning and delivery.  Risks and benefits of treatment clued and hair loss skin reaction fatigue alteration of blood counts all were described in detail to the patient and his family.  Patient is currently on 2 mg of Decadron  twice a day we will keep him on that dose and possibly increase that over the course of his radiation treatments depending on his symptoms.  I have  personally set up and ordered CT simulation for next week.  There will be extra effort by both professional staff as well as technical staff to coordinate and manage concurrent chemoradiation and ensuing side effects during his treatments. Patient comprehends my treatment plan well.  I would like to take this opportunity to thank you for allowing me to participate in the care of your patient.SABRA Marcey Penton, MD

## 2024-03-04 NOTE — Telephone Encounter (Signed)
 Wife is understandable about delay in medication refill. I refilled the Keppra  and Protonix  but verbalized that the medication should come from their PCP next time.   They are seeing Dr. Janjua tomorrow.   I sent a message to Dr. Eward office to see if they could make sure the medication that he prescribed goes to the same pharmacy for their convinence.

## 2024-03-05 ENCOUNTER — Ambulatory Visit: Attending: Surgery | Admitting: Physician Assistant

## 2024-03-05 ENCOUNTER — Other Ambulatory Visit (HOSPITAL_COMMUNITY): Payer: Self-pay

## 2024-03-05 ENCOUNTER — Ambulatory Visit: Admitting: Neurosurgery

## 2024-03-05 ENCOUNTER — Encounter: Payer: Self-pay | Admitting: Neurosurgery

## 2024-03-05 ENCOUNTER — Other Ambulatory Visit: Payer: Self-pay

## 2024-03-05 ENCOUNTER — Other Ambulatory Visit: Payer: Self-pay | Admitting: Pharmacy Technician

## 2024-03-05 ENCOUNTER — Telehealth: Payer: Self-pay | Admitting: Pharmacy Technician

## 2024-03-05 VITALS — BP 128/80 | HR 72 | Ht 71.0 in | Wt 243.0 lb

## 2024-03-05 VITALS — BP 148/83 | HR 86 | Temp 97.7°F | Wt 243.0 lb

## 2024-03-05 DIAGNOSIS — Z4889 Encounter for other specified surgical aftercare: Secondary | ICD-10-CM

## 2024-03-05 DIAGNOSIS — C712 Malignant neoplasm of temporal lobe: Secondary | ICD-10-CM

## 2024-03-05 DIAGNOSIS — G9389 Other specified disorders of brain: Secondary | ICD-10-CM

## 2024-03-05 DIAGNOSIS — I998 Other disorder of circulatory system: Secondary | ICD-10-CM

## 2024-03-05 MED ORDER — OXYCODONE-ACETAMINOPHEN 5-325 MG PO TABS: 1.0000 | ORAL_TABLET | ORAL | 0 refills | Status: DC | PRN

## 2024-03-05 NOTE — Progress Notes (Signed)
 Specialty Pharmacy Initiation Note   Douglas Hunter is a 68 y.o. male who will be followed by the specialty pharmacy service for RxSp Oncology    Review of administration, indication, effectiveness, safety, potential side effects, storage/disposable, and missed dose instructions occurred today for patient's specialty medication(s) No data recorded    Patient/Caregiver did not have any additional questions or concerns.   Patient's therapy is appropriate to: Initiate    Goals Addressed             This Visit's Progress    Achieve a cure       Patient is initiating therapy. Patient will maintain adherence         Muskaan Smet N Kamaryn Grimley Specialty Pharmacist

## 2024-03-05 NOTE — Progress Notes (Signed)
 67 year old gentleman with a left temporal glioblastoma.  We resected this a few weeks ago.  When he came and saw Lauraine a week or 2 ago he had a cold left leg and we sent him emergently to the emergency room.  Here he was found to have an arterial occlusion and was taken to the operating room.  He returns now and is doing much better.  He still has left leg pain.  His cranial incision is completely healed.  He is scheduled to undergo radiation and chemo treatments.  Really happy to see how well he is doing because his speech is completely normal plan and I am happy to see his progress.  I told him that given the fact that he is currently following up with radiation oncology and oncology, he does not need to come and see me anymore.  If there is anything I can contribute to his care as a neurosurgeon, he is always welcome to call me.  They are comfortable with that they will let me know if there is anything I can do to contribute.

## 2024-03-05 NOTE — H&P (View-Only) (Signed)
 POST OPERATIVE OFFICE NOTE    CC:  F/u for surgery  HPI:  This is a 68 y.o. male who is s/p 1) left lower extremity common femoral thromboendarterectomy and bovine pericardial patch angioplasty 2) left femoral-popliteal arterial thrombectomy 3) left posterior tibial arterial thrombectomy 4) left lower extremity calf 4 compartment fasciotomy 5) left lower extremity angiogram on 02/09/24 by Dr. Magda.  This was performed due to CLI.   He was seen a couple weeks ago due to on going left foot pain. He was given some Percocet for pain control. It was discussed with him at that time that he is high risk for amputation if there is no improvement of his pain. Duplex showed occlusion of all tibial vessels and absent toe pressure. Unfortunately he has no other revascularization options.   Pt returns today for follow up with his wife and two sons. Unfortunately his left leg pain persists and he has had some dehiscence of his fasciotomy incisions and has developed a new ulceration between his 4th and 5th toes. Says his pain is keeping him awake at night. Unable to walk or bear weight on his left foot due to significant heal pain. He is medically managed on Xarelto   He was seen by Oncologist, Dr. Lenn yesterday regarding his left temporal Glioblastoma. He is scheduled to start radiation and Chemotherapy on Monday for 6 weeks. He currently is also on Decadron .  No Known Allergies  Current Outpatient Medications  Medication Sig Dispense Refill   acetaminophen  (TYLENOL ) 500 MG tablet Take 500 mg by mouth 4 (four) times daily as needed for mild pain or moderate pain.     atorvastatin  (LIPITOR ) 80 MG tablet Take 80 mg by mouth at bedtime.     benazepril  (LOTENSIN ) 40 MG tablet TAKE 1 TABLET BY MOUTH ONCE DAILY (Patient taking differently: Take 40 mg by mouth daily.) 90 tablet 3   cholecalciferol  (VITAMIN D3) 25 MCG (1000 UNIT) tablet Take 500 Units by mouth daily.     Continuous Blood Gluc Sensor  (FREESTYLE LIBRE 14 DAY SENSOR) MISC SMARTSIG:1 Kit(s) Topical Every 2 Weeks     dexamethasone  (DECADRON ) 2 MG tablet Take 1 tablet (2 mg total) by mouth daily. 30 tablet 0   dofetilide  (TIKOSYN ) 500 MCG capsule TAKE 1 CAPSULE BY MOUTH TWICE DAILY 180 capsule 2   ezetimibe  (ZETIA ) 10 MG tablet TAKE 1 TABLET BY MOUTH ONCE DAILY 90 tablet 3   furosemide  (LASIX ) 20 MG tablet TAKE 1 TABLET BY MOUTH TWICE DAILY AS NEEDED 180 tablet 0   insulin  lispro (HUMALOG ) 100 UNIT/ML KiwkPen Inject 8-12 Units into the skin 2 (two) times daily. Sliding Scale     INSULIN  SYRINGE 1CC/29G 29G X 1/2 1 ML MISC USE AS DIRECTED 100 each PRN   JARDIANCE 25 MG TABS tablet Take 12.5 mg by mouth daily.     LANTUS  SOLOSTAR 100 UNIT/ML Solostar Pen Inject into the skin.     levETIRAcetam  (KEPPRA ) 500 MG tablet Take 1 tablet (500 mg total) by mouth 2 (two) times daily. 60 tablet 0   metoprolol  succinate (TOPROL -XL) 25 MG 24 hr tablet TAKE 1 TABLET BY MOUTH ONCE DAILY 90 tablet 1   nitroGLYCERIN  (NITROSTAT ) 0.4 MG SL tablet Place 1 tablet (0.4 mg total) under the tongue every 5 (five) minutes as needed for chest pain. 25 tablet 1   [START ON 03/13/2024] ondansetron  (ZOFRAN ) 8 MG tablet Take 1 tablet (8 mg total) by mouth every 8 (eight) hours as needed for nausea or  vomiting. May take 30-60 minutes prior to Temodar administration if nausea/vomiting occurs as needed. 30 tablet 1   ONE TOUCH ULTRA TEST test strip TEST THREE TIMES A DAY AS DIRECTED. 100 each 12   OVER THE COUNTER MEDICATION Take 1 capsule by mouth 2 (two) times daily. Omega XL     pantoprazole  (PROTONIX ) 40 MG tablet Take 1 tablet (40 mg total) by mouth daily. Stop taking if no longer on decadron . 30 tablet 3   rivaroxaban  (XARELTO ) 20 MG TABS tablet Take 1 tablet (20 mg total) by mouth daily with supper. 30 tablet 3   [START ON 03/13/2024] temozolomide (TEMODAR) 140 MG capsule Take 1 capsule (140 mg total) by mouth daily. May take on an empty stomach to decrease  nausea & vomiting. 42 capsule 0   [START ON 03/13/2024] temozolomide (TEMODAR) 20 MG capsule Take 1 capsule (20 mg total) by mouth daily. May take on an empty stomach to decrease nausea & vomiting. 42 capsule 0   No current facility-administered medications for this visit.   Facility-Administered Medications Ordered in Other Visits  Medication Dose Route Frequency Provider Last Rate Last Admin   sodium chloride  flush (NS) 0.9 % injection 3 mL  3 mL Intravenous Q12H Visser, Jacquelyn D, PA-C         ROS:  See HPI  Physical Exam:  Vitals:   03/05/24 1334  BP: (!) 148/83  Pulse: 86  Temp: 97.7 F (36.5 C)   General: in no acute distress Incision:  left groin incision healing well. Staples removed today Extremities:  Left leg warm, motor and sensation intact. Fasciotomy incisions dehiscing and serous drainage from distal medal ankle incision. Ulceration between 4th and 5th toes Neuro: alert and oriented   Assessment/Plan:  This is a 68 y.o. male who is s/p: 1) left lower extremity common femoral thromboendarterectomy and bovine pericardial patch angioplasty 2) left femoral-popliteal arterial thrombectomy 3) left posterior tibial arterial thrombectomy 4) left lower extremity calf 4 compartment fasciotomy 5) left lower extremity angiogram on 02/09/24 by Dr. Magda. This was performed due to CLI. Unfortunately he has had continued rest pain with worsening of his lower extremity incisions and new interdigit ulceration. Left groin healing well and staples removed today. Patient seen with Dr. Magda who did offer angiogram to further evaluate if any bypass options would be available, but ultimately we discussed that based on his initial presentation and non invasive studies as well as his physical exam there would be very minimal likelihood of limb salvage. Patient and his family agree that he is not able to continue to live with his current pain and are agreeable to proceed with left above knee  amputation. This is very unfortunate situation given his recent diagnosis of Glioblastoma. We will reach out to Dr. Lenn regarding recommendations, but from our standpoint would not postpone radiation or chemotherapy. We did discuss with patient and his family that the chemotherapy and Decadron  can inhibit healing.  - He will continue to hold his Xarelto  - Plan is for left above knee amputation with Dr. Magda on Thursday 03/07/24   Curry Damme, [facility] [phone]   Clinic MD:  Magda

## 2024-03-05 NOTE — Progress Notes (Signed)
 POST OPERATIVE OFFICE NOTE    CC:  F/u for surgery  HPI:  This is a 68 y.o. male who is s/p 1) left lower extremity common femoral thromboendarterectomy and bovine pericardial patch angioplasty 2) left femoral-popliteal arterial thrombectomy 3) left posterior tibial arterial thrombectomy 4) left lower extremity calf 4 compartment fasciotomy 5) left lower extremity angiogram on 02/09/24 by Dr. Magda.  This was performed due to CLI.   He was seen a couple weeks ago due to on going left foot pain. He was given some Percocet for pain control. It was discussed with him at that time that he is high risk for amputation if there is no improvement of his pain. Duplex showed occlusion of all tibial vessels and absent toe pressure. Unfortunately he has no other revascularization options.   Pt returns today for follow up with his wife and two sons. Unfortunately his left leg pain persists and he has had some dehiscence of his fasciotomy incisions and has developed a new ulceration between his 4th and 5th toes. Says his pain is keeping him awake at night. Unable to walk or bear weight on his left foot due to significant heal pain. He is medically managed on Xarelto   He was seen by Oncologist, Dr. Lenn yesterday regarding his left temporal Glioblastoma. He is scheduled to start radiation and Chemotherapy on Monday for 6 weeks. He currently is also on Decadron .  No Known Allergies  Current Outpatient Medications  Medication Sig Dispense Refill   acetaminophen  (TYLENOL ) 500 MG tablet Take 500 mg by mouth 4 (four) times daily as needed for mild pain or moderate pain.     atorvastatin  (LIPITOR ) 80 MG tablet Take 80 mg by mouth at bedtime.     benazepril  (LOTENSIN ) 40 MG tablet TAKE 1 TABLET BY MOUTH ONCE DAILY (Patient taking differently: Take 40 mg by mouth daily.) 90 tablet 3   cholecalciferol  (VITAMIN D3) 25 MCG (1000 UNIT) tablet Take 500 Units by mouth daily.     Continuous Blood Gluc Sensor  (FREESTYLE LIBRE 14 DAY SENSOR) MISC SMARTSIG:1 Kit(s) Topical Every 2 Weeks     dexamethasone  (DECADRON ) 2 MG tablet Take 1 tablet (2 mg total) by mouth daily. 30 tablet 0   dofetilide  (TIKOSYN ) 500 MCG capsule TAKE 1 CAPSULE BY MOUTH TWICE DAILY 180 capsule 2   ezetimibe  (ZETIA ) 10 MG tablet TAKE 1 TABLET BY MOUTH ONCE DAILY 90 tablet 3   furosemide  (LASIX ) 20 MG tablet TAKE 1 TABLET BY MOUTH TWICE DAILY AS NEEDED 180 tablet 0   insulin  lispro (HUMALOG ) 100 UNIT/ML KiwkPen Inject 8-12 Units into the skin 2 (two) times daily. Sliding Scale     INSULIN  SYRINGE 1CC/29G 29G X 1/2 1 ML MISC USE AS DIRECTED 100 each PRN   JARDIANCE 25 MG TABS tablet Take 12.5 mg by mouth daily.     LANTUS  SOLOSTAR 100 UNIT/ML Solostar Pen Inject into the skin.     levETIRAcetam  (KEPPRA ) 500 MG tablet Take 1 tablet (500 mg total) by mouth 2 (two) times daily. 60 tablet 0   metoprolol  succinate (TOPROL -XL) 25 MG 24 hr tablet TAKE 1 TABLET BY MOUTH ONCE DAILY 90 tablet 1   nitroGLYCERIN  (NITROSTAT ) 0.4 MG SL tablet Place 1 tablet (0.4 mg total) under the tongue every 5 (five) minutes as needed for chest pain. 25 tablet 1   [START ON 03/13/2024] ondansetron  (ZOFRAN ) 8 MG tablet Take 1 tablet (8 mg total) by mouth every 8 (eight) hours as needed for nausea or  vomiting. May take 30-60 minutes prior to Temodar administration if nausea/vomiting occurs as needed. 30 tablet 1   ONE TOUCH ULTRA TEST test strip TEST THREE TIMES A DAY AS DIRECTED. 100 each 12   OVER THE COUNTER MEDICATION Take 1 capsule by mouth 2 (two) times daily. Omega XL     pantoprazole  (PROTONIX ) 40 MG tablet Take 1 tablet (40 mg total) by mouth daily. Stop taking if no longer on decadron . 30 tablet 3   rivaroxaban  (XARELTO ) 20 MG TABS tablet Take 1 tablet (20 mg total) by mouth daily with supper. 30 tablet 3   [START ON 03/13/2024] temozolomide (TEMODAR) 140 MG capsule Take 1 capsule (140 mg total) by mouth daily. May take on an empty stomach to decrease  nausea & vomiting. 42 capsule 0   [START ON 03/13/2024] temozolomide (TEMODAR) 20 MG capsule Take 1 capsule (20 mg total) by mouth daily. May take on an empty stomach to decrease nausea & vomiting. 42 capsule 0   No current facility-administered medications for this visit.   Facility-Administered Medications Ordered in Other Visits  Medication Dose Route Frequency Provider Last Rate Last Admin   sodium chloride  flush (NS) 0.9 % injection 3 mL  3 mL Intravenous Q12H Visser, Jacquelyn D, PA-C         ROS:  See HPI  Physical Exam:  Vitals:   03/05/24 1334  BP: (!) 148/83  Pulse: 86  Temp: 97.7 F (36.5 C)   General: in no acute distress Incision:  left groin incision healing well. Staples removed today Extremities:  Left leg warm, motor and sensation intact. Fasciotomy incisions dehiscing and serous drainage from distal medal ankle incision. Ulceration between 4th and 5th toes Neuro: alert and oriented   Assessment/Plan:  This is a 68 y.o. male who is s/p: 1) left lower extremity common femoral thromboendarterectomy and bovine pericardial patch angioplasty 2) left femoral-popliteal arterial thrombectomy 3) left posterior tibial arterial thrombectomy 4) left lower extremity calf 4 compartment fasciotomy 5) left lower extremity angiogram on 02/09/24 by Dr. Magda. This was performed due to CLI. Unfortunately he has had continued rest pain with worsening of his lower extremity incisions and new interdigit ulceration. Left groin healing well and staples removed today. Patient seen with Dr. Magda who did offer angiogram to further evaluate if any bypass options would be available, but ultimately we discussed that based on his initial presentation and non invasive studies as well as his physical exam there would be very minimal likelihood of limb salvage. Patient and his family agree that he is not able to continue to live with his current pain and are agreeable to proceed with left above knee  amputation. This is very unfortunate situation given his recent diagnosis of Glioblastoma. We will reach out to Dr. Lenn regarding recommendations, but from our standpoint would not postpone radiation or chemotherapy. We did discuss with patient and his family that the chemotherapy and Decadron  can inhibit healing.  - He will continue to hold his Xarelto  - Plan is for left above knee amputation with Dr. Magda on Thursday 03/07/24   Curry Damme, [facility] [phone]   Clinic MD:  Magda

## 2024-03-05 NOTE — Progress Notes (Signed)
 Specialty Pharmacy Initial Fill Coordination Note  Douglas Hunter is a 68 y.o. male contacted today regarding initial fill of specialty medication(s) Temozolomide [facility])   Patient requested Delivery   Delivery date: 03/07/24   Verified address: [address] [place] [address]   Medication will be filled on: 03/06/24   Patient is aware of $111.83 and $42.92 copayment for both strengths.    [name] ([name]) [name], CPhT  [facility], [name], [facility] - Oral Chemotherapy Patient Advocate Specialist III Phone: [phone]  Fax: [phone]

## 2024-03-05 NOTE — Telephone Encounter (Signed)
 Oral Oncology Patient Advocate Encounter  Patient successfully OnBoarded and drug education provided by pharmacist. Medication scheduled to be shipped on 12/03 for delivery on 12/04 from [facility] to patient's address. Patient also knows to call me at [phone] with any questions or concerns regarding receiving medication or if there is any unexpected change in co-pay.    Douglas Hunter (Douglas) [name], CPhT  [facility], Douglas Hunter, [facility] - Oral Chemotherapy Patient Advocate Specialist III Phone: [phone]  Fax: [phone]

## 2024-03-06 ENCOUNTER — Other Ambulatory Visit: Payer: Self-pay

## 2024-03-06 ENCOUNTER — Ambulatory Visit: Admitting: Speech Pathology

## 2024-03-06 ENCOUNTER — Encounter (HOSPITAL_COMMUNITY): Payer: Self-pay | Admitting: Vascular Surgery

## 2024-03-06 ENCOUNTER — Telehealth: Payer: Self-pay | Admitting: Pharmacist

## 2024-03-06 ENCOUNTER — Other Ambulatory Visit (HOSPITAL_COMMUNITY): Payer: Self-pay

## 2024-03-06 NOTE — Anesthesia Preprocedure Evaluation (Signed)
 Anesthesia Evaluation  Patient identified by MRN, date of birth, ID band Patient awake    Reviewed: Allergy & Precautions, NPO status , Patient's Chart, lab work & pertinent test results  History of Anesthesia Complications Negative for: history of anesthetic complications  Airway Mallampati: III  TM Distance: >3 FB Neck ROM: Full    Dental  (+) Dental Advisory Given, Teeth Intact   Pulmonary sleep apnea and Continuous Positive Airway Pressure Ventilation , former smoker   Pulmonary exam normal        Cardiovascular hypertension, Pt. on medications and Pt. on home beta blockers (-) angina + CAD, + Past MI and + Peripheral Vascular Disease  Normal cardiovascular exam+ dysrhythmias Atrial Fibrillation      Neuro/Psych Seizures -, Well Controlled,   Brain tumor   negative psych ROS   GI/Hepatic Neg liver ROS,GERD  Medicated and Controlled,,  Endo/Other  diabetes, Poorly Controlled, Type 2, Oral Hypoglycemic Agents, Insulin  Dependent   Obesity   Renal/GU negative Renal ROS     Musculoskeletal negative musculoskeletal ROS (+)    Abdominal   Peds  Hematology  (+) Blood dyscrasia, anemia  On xarelto     Anesthesia Other Findings   Reproductive/Obstetrics                              Anesthesia Physical Anesthesia Plan  ASA: 3  Anesthesia Plan: General   Post-op Pain Management: Tylenol  PO (pre-op)*, Ketamine  IV* and Dilaudid  IV   Induction: Intravenous  PONV Risk Score and Plan: 2 and Treatment may vary due to age or medical condition, Ondansetron  and Midazolam   Airway Management Planned: LMA  Additional Equipment: None  Intra-op Plan:   Post-operative Plan: Extubation in OR  Informed Consent: I have reviewed the patients History and Physical, chart, labs and discussed the procedure including the risks, benefits and alternatives for the proposed anesthesia with the patient or  authorized representative who has indicated his/her understanding and acceptance.     Dental advisory given  Plan Discussed with: CRNA and Anesthesiologist  Anesthesia Plan Comments: (PAT note by Lynwood Hope, PA-C)         Anesthesia Quick Evaluation

## 2024-03-06 NOTE — Progress Notes (Signed)
 Anesthesia Chart Review: Same day workup  68 yo male follows with cardiology for hx of CAD (NSTEMI 2013, subtotal occlusion LCX with collaterals, medically managed), afib/flutter (s/p ablation 01/29/2013, redo PVI ablation 11/15/2021, maintained on Tikosyn  and Xarelto ), OSA on CPAP, HTN, HLD. Echo 08/2021 with LVEF 60-65%, grade 2 dd, normal RV, RVSP 38.9 mmHg, no significant valvular abnormalities.   Follows with vascular surgery for extensive PAD who on 02/09/24 underwent LLE CFA thromboendarterectomy and bovine pericardial patch angioplasty, left femoral-popliteal arterial thrombectomy, left PTA thrombectomy, LLE calf 4 compartment fasciotomy. When seen in followup 03/05/24 he was noted to have continued rest pain with worsening of his lower extremity incisions and new interdigit ulceration. AKA recommended.   Follows with neurosurgery and oncology for recent diagnosis of left temporal glioblastoma s/p resection 01/29/24. He is pending chemoradiation. Currently on decadron  2mg  daily.   Other pertinent hx includes former smoker (quit 2019), IDDM2 (A1c 8.0 on 01/30/24), GERD on PPI.  Pt will need DOS labs and evaluation.   EKG 01/30/24: NSR. Rate 74. RBBB.  TTE 08/03/2021: IMPRESSIONS   1. Left ventricular ejection fraction, by estimation, is 60 to 65%. The  left ventricle has normal function. The left ventricle has no regional  wall motion abnormalities. Left ventricular diastolic parameters are  consistent with Grade II diastolic  dysfunction (pseudonormalization).   2. Right ventricular systolic function is normal. The right ventricular  size is normal. There is mildly elevated pulmonary artery systolic  pressure. The estimated right ventricular systolic pressure is 38.9 mmHg.   3. Left atrial size was moderately dilated.   4. The mitral valve is normal in structure. No evidence of mitral valve  regurgitation. No evidence of mitral stenosis. Moderate mitral annular  calcification.   5. The  aortic valve was not well visualized. Aortic valve regurgitation  is not visualized. No aortic stenosis is present.   6. The inferior vena cava is normal in size with greater than 50%  respiratory variability, suggesting right atrial pressure of 3 mmHg.    Cardiac cath 03/06/2020: Prox LAD lesion is 40% stenosed. Mid LAD lesion is 50% stenosed. Prox Cx to Mid Cx lesion is 100% stenosed. 3rd Mrg lesion is 100% stenosed. The left ventricular systolic function is normal. LV end diastolic pressure is normal. The left ventricular ejection fraction is 50-55% by visual estimate. There is no mitral valve regurgitation. There is mild aortic valve stenosis. 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 left to left, unchanged from 2012, moderate disease mid LAD at the takeoff of diagonal #2, 50%, mild eccentric disease more proximal LAD Medical management recommended Normal right heart pressures  Recommendations:  Etiology of chest tightness shortness of breath symptoms from stable chronic angina in the setting of above He does have 40 controlled diabetes, long history, strongly recommended he work on his diet, start walking program, get his weight down in effort to get hemoglobin A1c 7 Discussed how lesions above can progress in the setting of poorly controlled diabetes Reports that he is working with endocrinology    Lynwood Geofm RIGGERS [facility] Short Stay Center/Anesthesiology Phone [phone] 03/06/2024 9:21 AM

## 2024-03-06 NOTE — Progress Notes (Signed)
 SDW call  Patient's wife [name] was given pre-op instructions over the phone. She verbalized understanding of instructions provided. She denied patient with any SOB, fever or cough. States with his last brain surgery he has been having a lot of memory problems.    PCP - Dr. Cheryl Jericho Cardiologist - Dr. Evalene Lunger EP Cardiologist: Dr. Ole Holts Endocrinologist: Dr. Cherilyn at [facility] Pulmonary:    PPM/ICD - denies Device Orders - na Rep Notified - na   Chest x-ray - 01/24/2024 EKG -  01/30/2024 Stress Test - ECHO - 08/03/2021 Cardiac Cath - 03/06/2020  Sleep Study/sleep apnea/CPAP: OSA with nightly CPAP. States they do not want to bring their mask/machine with them  Type II diabetic. A1C 8.0  on 01/30/2024. Uses CGM Freestyle Libre 3 to right arm Fasting Blood sugar range: 100-200 How often check sugars: continuous Jardiance, states last dose 03/04/2024 Humalog , zero units DOS, use 1/2 correction dose if blood sugar over 220 Lantus , use 12 units the night before and the morning of surgery which is 50% of the regular dose   Blood Thinner Instructions: Xarelto , states last dose 03/04/2024 Aspirin Instructions:denies   ERAS Protcol - NPO   Anesthesia review: Yes. A-flutter, RBBB, DM, CAD, HTN, MI, OSA with CPAP  Your procedure is scheduled on Thursday March 07, 2024  Report to [facility] Main Entrance A at  1310 PM., then check in with the Admitting office.  Call this number if you have problems the morning of surgery:  [phone]   If you have any questions prior to your surgery date call [phone]: Open Monday-Friday 8am-4pm If you experience any cold or flu symptoms such as cough, fever, chills, shortness of breath, etc. between now and your scheduled surgery, please notify us  at the above number    Remember:  Do not eat or drink after midnight the night before your surgery  Take these medicines the morning of surgery with A SIP OF WATER:   Decadron , tikosyn , zetia , keppra , metoprolol , protonix   As needed: Tylenol , zofran , oxycodone   As of today, STOP taking any Aspirin (unless otherwise instructed by your surgeon) Aleve, Naproxen, Ibuprofen, Motrin, Advil, Goody's, BC's, all herbal medications, fish oil, and all vitamins.

## 2024-03-06 NOTE — Progress Notes (Signed)
 Per [name], cancelled initial fills for now. Patient to have amputation which will affect dosing based on BSA. Alyson will address after surgery.

## 2024-03-07 ENCOUNTER — Encounter (HOSPITAL_COMMUNITY): Admitting: Physician Assistant

## 2024-03-07 ENCOUNTER — Other Ambulatory Visit: Payer: Self-pay

## 2024-03-07 ENCOUNTER — Inpatient Hospital Stay (HOSPITAL_COMMUNITY): Admitting: Physician Assistant

## 2024-03-07 ENCOUNTER — Inpatient Hospital Stay (HOSPITAL_COMMUNITY)
Admission: RE | Admit: 2024-03-07 | Discharge: 2024-03-15 | DRG: 239 | Disposition: A | Discharge status: Rehabilitation Facility | Attending: Internal Medicine | Admitting: Internal Medicine

## 2024-03-07 ENCOUNTER — Encounter (HOSPITAL_COMMUNITY): Payer: Self-pay | Admitting: Vascular Surgery

## 2024-03-07 ENCOUNTER — Encounter (HOSPITAL_COMMUNITY): Admission: RE | Disposition: A | Payer: Self-pay | Source: Home / Self Care | Attending: Internal Medicine

## 2024-03-07 DIAGNOSIS — E785 Hyperlipidemia, unspecified: Secondary | ICD-10-CM | POA: Diagnosis present

## 2024-03-07 DIAGNOSIS — I998 Other disorder of circulatory system: Secondary | ICD-10-CM | POA: Diagnosis present

## 2024-03-07 DIAGNOSIS — C712 Malignant neoplasm of temporal lobe: Secondary | ICD-10-CM | POA: Diagnosis present

## 2024-03-07 DIAGNOSIS — Z794 Long term (current) use of insulin: Secondary | ICD-10-CM | POA: Diagnosis not present

## 2024-03-07 DIAGNOSIS — E1110 Type 2 diabetes mellitus with ketoacidosis without coma: Secondary | ICD-10-CM | POA: Diagnosis present

## 2024-03-07 DIAGNOSIS — E669 Obesity, unspecified: Secondary | ICD-10-CM | POA: Diagnosis present

## 2024-03-07 DIAGNOSIS — I3481 Nonrheumatic mitral (valve) annulus calcification: Secondary | ICD-10-CM | POA: Diagnosis present

## 2024-03-07 DIAGNOSIS — I483 Typical atrial flutter: Secondary | ICD-10-CM | POA: Diagnosis not present

## 2024-03-07 DIAGNOSIS — N179 Acute kidney failure, unspecified: Secondary | ICD-10-CM | POA: Diagnosis present

## 2024-03-07 DIAGNOSIS — I472 Ventricular tachycardia, unspecified: Secondary | ICD-10-CM | POA: Diagnosis present

## 2024-03-07 DIAGNOSIS — R739 Hyperglycemia, unspecified: Secondary | ICD-10-CM | POA: Diagnosis not present

## 2024-03-07 DIAGNOSIS — I2510 Atherosclerotic heart disease of native coronary artery without angina pectoris: Secondary | ICD-10-CM

## 2024-03-07 DIAGNOSIS — D631 Anemia in chronic kidney disease: Secondary | ICD-10-CM | POA: Diagnosis present

## 2024-03-07 DIAGNOSIS — Z87891 Personal history of nicotine dependence: Secondary | ICD-10-CM | POA: Diagnosis not present

## 2024-03-07 DIAGNOSIS — E66812 Obesity, class 2: Secondary | ICD-10-CM

## 2024-03-07 DIAGNOSIS — I70222 Atherosclerosis of native arteries of extremities with rest pain, left leg: Secondary | ICD-10-CM | POA: Diagnosis present

## 2024-03-07 DIAGNOSIS — E1122 Type 2 diabetes mellitus with diabetic chronic kidney disease: Secondary | ICD-10-CM | POA: Diagnosis present

## 2024-03-07 DIAGNOSIS — I10 Essential (primary) hypertension: Secondary | ICD-10-CM | POA: Diagnosis present

## 2024-03-07 DIAGNOSIS — E11621 Type 2 diabetes mellitus with foot ulcer: Secondary | ICD-10-CM | POA: Diagnosis present

## 2024-03-07 DIAGNOSIS — I480 Paroxysmal atrial fibrillation: Secondary | ICD-10-CM | POA: Diagnosis present

## 2024-03-07 DIAGNOSIS — D649 Anemia, unspecified: Secondary | ICD-10-CM | POA: Diagnosis present

## 2024-03-07 DIAGNOSIS — I130 Hypertensive heart and chronic kidney disease with heart failure and stage 1 through stage 4 chronic kidney disease, or unspecified chronic kidney disease: Secondary | ICD-10-CM | POA: Diagnosis present

## 2024-03-07 DIAGNOSIS — S78112A Complete traumatic amputation at level between left hip and knee, initial encounter: Principal | ICD-10-CM

## 2024-03-07 DIAGNOSIS — I70202 Unspecified atherosclerosis of native arteries of extremities, left leg: Secondary | ICD-10-CM | POA: Diagnosis not present

## 2024-03-07 DIAGNOSIS — R413 Other amnesia: Secondary | ICD-10-CM | POA: Diagnosis not present

## 2024-03-07 DIAGNOSIS — D72829 Elevated white blood cell count, unspecified: Secondary | ICD-10-CM | POA: Diagnosis present

## 2024-03-07 DIAGNOSIS — I25110 Atherosclerotic heart disease of native coronary artery with unstable angina pectoris: Secondary | ICD-10-CM | POA: Diagnosis present

## 2024-03-07 DIAGNOSIS — G4733 Obstructive sleep apnea (adult) (pediatric): Secondary | ICD-10-CM | POA: Diagnosis not present

## 2024-03-07 DIAGNOSIS — D496 Neoplasm of unspecified behavior of brain: Secondary | ICD-10-CM | POA: Diagnosis present

## 2024-03-07 DIAGNOSIS — I70229 Atherosclerosis of native arteries of extremities with rest pain, unspecified extremity: Secondary | ICD-10-CM | POA: Diagnosis present

## 2024-03-07 DIAGNOSIS — E1151 Type 2 diabetes mellitus with diabetic peripheral angiopathy without gangrene: Secondary | ICD-10-CM | POA: Diagnosis present

## 2024-03-07 DIAGNOSIS — I4892 Unspecified atrial flutter: Secondary | ICD-10-CM | POA: Diagnosis present

## 2024-03-07 DIAGNOSIS — G936 Cerebral edema: Secondary | ICD-10-CM | POA: Diagnosis present

## 2024-03-07 DIAGNOSIS — E782 Mixed hyperlipidemia: Secondary | ICD-10-CM

## 2024-03-07 DIAGNOSIS — I5032 Chronic diastolic (congestive) heart failure: Secondary | ICD-10-CM | POA: Diagnosis present

## 2024-03-07 DIAGNOSIS — N1831 Chronic kidney disease, stage 3a: Secondary | ICD-10-CM | POA: Diagnosis present

## 2024-03-07 DIAGNOSIS — I70262 Atherosclerosis of native arteries of extremities with gangrene, left leg: Secondary | ICD-10-CM | POA: Diagnosis not present

## 2024-03-07 DIAGNOSIS — C719 Malignant neoplasm of brain, unspecified: Secondary | ICD-10-CM | POA: Diagnosis not present

## 2024-03-07 DIAGNOSIS — Z7901 Long term (current) use of anticoagulants: Secondary | ICD-10-CM | POA: Diagnosis not present

## 2024-03-07 DIAGNOSIS — E1165 Type 2 diabetes mellitus with hyperglycemia: Secondary | ICD-10-CM | POA: Diagnosis not present

## 2024-03-07 DIAGNOSIS — D62 Acute posthemorrhagic anemia: Secondary | ICD-10-CM | POA: Diagnosis not present

## 2024-03-07 DIAGNOSIS — D630 Anemia in neoplastic disease: Secondary | ICD-10-CM | POA: Diagnosis present

## 2024-03-07 DIAGNOSIS — Z89612 Acquired absence of left leg above knee: Secondary | ICD-10-CM | POA: Diagnosis not present

## 2024-03-07 DIAGNOSIS — E66811 Obesity, class 1: Secondary | ICD-10-CM | POA: Diagnosis present

## 2024-03-07 HISTORY — PX: APPLICATION OF WOUND VAC: SHX5189

## 2024-03-07 HISTORY — PX: AMPUTATION: SHX166

## 2024-03-07 LAB — PROTIME-INR
INR: 1 (ref 0.8–1.2)
Prothrombin Time: 14.2 s (ref 11.4–15.2)

## 2024-03-07 LAB — COMPREHENSIVE METABOLIC PANEL WITH GFR
ALT: 18 U/L (ref 0–44)
AST: 24 U/L (ref 15–41)
Albumin: 2.4 g/dL — ABNORMAL LOW (ref 3.5–5.0)
Alkaline Phosphatase: 110 U/L (ref 38–126)
Anion gap: 9 (ref 5–15)
BUN: 25 mg/dL — ABNORMAL HIGH (ref 8–23)
CO2: 22 mmol/L (ref 22–32)
Calcium: 8.2 mg/dL — ABNORMAL LOW (ref 8.9–10.3)
Chloride: 104 mmol/L (ref 98–111)
Creatinine, Ser: 1.19 mg/dL (ref 0.61–1.24)
GFR, Estimated: >60 mL/min (ref >60)
Glucose, Bld: 312 mg/dL — ABNORMAL HIGH (ref 70–99)
Potassium: 5.1 mmol/L (ref 3.5–5.1)
Sodium: 135 mmol/L (ref 135–145)
Total Bilirubin: 1.6 mg/dL — ABNORMAL HIGH (ref 0.0–1.2)
Total Protein: 6.2 g/dL — ABNORMAL LOW (ref 6.5–8.1)

## 2024-03-07 LAB — CBC
HCT: 34.2 % — ABNORMAL LOW (ref 39.0–52.0)
Hemoglobin: 10.8 g/dL — ABNORMAL LOW (ref 13.0–17.0)
MCH: 30.8 pg (ref 26.0–34.0)
MCHC: 31.6 g/dL (ref 30.0–36.0)
MCV: 97.4 fL (ref 80.0–100.0)
Platelets: 358 K/uL (ref 150–400)
RBC: 3.51 MIL/uL — ABNORMAL LOW (ref 4.22–5.81)
RDW: 15.2 % (ref 11.5–15.5)
WBC: 14.9 K/uL — ABNORMAL HIGH (ref 4.0–10.5)
nRBC: 0 % (ref 0.0–0.2)

## 2024-03-07 LAB — URINALYSIS, ROUTINE W REFLEX MICROSCOPIC
Bilirubin Urine: NEGATIVE
Glucose, UA: >=500 mg/dL — AB
Ketones, ur: NEGATIVE mg/dL
Leukocytes,Ua: NEGATIVE
Nitrite: NEGATIVE
Protein, ur: NEGATIVE mg/dL
Specific Gravity, Urine: 1.02 (ref 1.005–1.030)
pH: 6 (ref 5.0–8.0)

## 2024-03-07 LAB — URINALYSIS, MICROSCOPIC (REFLEX)
RBC / HPF: NONE SEEN RBC/hpf (ref 0–5)
Squamous Epithelial / HPF: NONE SEEN /HPF (ref 0–5)

## 2024-03-07 LAB — APTT: aPTT: 29 s (ref 24–36)

## 2024-03-07 LAB — BASIC METABOLIC PANEL WITH GFR
Anion gap: 9 (ref 5–15)
BUN: 24 mg/dL — ABNORMAL HIGH (ref 8–23)
CO2: 24 mmol/L (ref 22–32)
Calcium: 8.5 mg/dL — ABNORMAL LOW (ref 8.9–10.3)
Chloride: 105 mmol/L (ref 98–111)
Creatinine, Ser: 1.19 mg/dL (ref 0.61–1.24)
GFR, Estimated: >60 mL/min (ref >60)
Glucose, Bld: 219 mg/dL — ABNORMAL HIGH (ref 70–99)
Potassium: 4.5 mmol/L (ref 3.5–5.1)
Sodium: 138 mmol/L (ref 135–145)

## 2024-03-07 LAB — GLUCOSE, CAPILLARY
Glucose-Capillary: 295 mg/dL — ABNORMAL HIGH (ref 70–99)
Glucose-Capillary: 229 mg/dL — ABNORMAL HIGH (ref 70–99)
Glucose-Capillary: 223 mg/dL — ABNORMAL HIGH (ref 70–99)
Glucose-Capillary: 191 mg/dL — ABNORMAL HIGH (ref 70–99)

## 2024-03-07 LAB — TYPE AND SCREEN
ABO/RH(D): O POS
Antibody Screen: NEGATIVE

## 2024-03-07 SURGERY — AMPUTATION, ABOVE KNEE
Anesthesia: General | Site: Leg Lower | Laterality: Left

## 2024-03-07 MED ORDER — ACETAMINOPHEN 325 MG PO TABS: 650.0000 mg | ORAL_TABLET | Freq: Four times a day (QID) | ORAL | Status: DC | PRN

## 2024-03-07 MED ORDER — HYDROMORPHONE HCL 1 MG/ML IJ SOLN
0.5000 mg | INTRAMUSCULAR | Status: DC | PRN
  Administered 2024-03-08: 0.5 mg via INTRAVENOUS
  Filled 2024-03-07: qty 0.5

## 2024-03-07 MED ORDER — FENTANYL CITRATE (PF) 100 MCG/2ML IJ SOLN
25.0000 ug | INTRAMUSCULAR | Status: DC | PRN
  Administered 2024-03-07 (×3): 50 ug via INTRAVENOUS

## 2024-03-07 MED ORDER — CHLORHEXIDINE GLUCONATE CLOTH 2 % EX PADS: 6.0000 | MEDICATED_PAD | Freq: Once | CUTANEOUS | Status: DC

## 2024-03-07 MED ORDER — FENTANYL CITRATE (PF) 100 MCG/2ML IJ SOLN
INTRAMUSCULAR | Status: AC
  Filled 2024-03-07: qty 2

## 2024-03-07 MED ORDER — HYDROMORPHONE HCL 1 MG/ML IJ SOLN
INTRAMUSCULAR | Status: AC
  Filled 2024-03-07: qty 0.5

## 2024-03-07 MED ORDER — KETAMINE HCL 50 MG/5ML IJ SOSY
PREFILLED_SYRINGE | INTRAMUSCULAR | Status: DC | PRN
  Administered 2024-03-07: 50 mg via INTRAVENOUS

## 2024-03-07 MED ORDER — CHLORHEXIDINE GLUCONATE 0.12 % MT SOLN
15.0000 mL | Freq: Once | OROMUCOSAL | Status: AC
  Administered 2024-03-07: 15 mL via OROMUCOSAL
  Filled 2024-03-07: qty 15

## 2024-03-07 MED ORDER — PROPOFOL 10 MG/ML IV BOLUS
INTRAVENOUS | Status: DC | PRN
  Administered 2024-03-07: 120 mg via INTRAVENOUS

## 2024-03-07 MED ORDER — ONDANSETRON HCL 4 MG/2ML IJ SOLN
INTRAMUSCULAR | Status: DC | PRN
  Administered 2024-03-07: 4 mg via INTRAVENOUS

## 2024-03-07 MED ORDER — FENTANYL CITRATE (PF) 250 MCG/5ML IJ SOLN
INTRAMUSCULAR | Status: AC
  Filled 2024-03-07: qty 5

## 2024-03-07 MED ORDER — CEFAZOLIN SODIUM-DEXTROSE 1-4 GM/50ML-% IV SOLN
1.0000 g | Freq: Three times a day (TID) | INTRAVENOUS | Status: AC
  Administered 2024-03-08 (×2): 1 g via INTRAVENOUS
  Filled 2024-03-07 (×2): qty 50

## 2024-03-07 MED ORDER — ONDANSETRON HCL 4 MG/2ML IJ SOLN: 4.0000 mg | Freq: Once | INTRAMUSCULAR | Status: DC | PRN

## 2024-03-07 MED ORDER — 0.9 % SODIUM CHLORIDE (POUR BTL) OPTIME
TOPICAL | Status: DC | PRN
  Administered 2024-03-07: 1000 mL

## 2024-03-07 MED ORDER — LACTATED RINGERS IV SOLN: INTRAVENOUS | Status: DC

## 2024-03-07 MED ORDER — INSULIN GLARGINE-YFGN 100 UNIT/ML ~~LOC~~ SOLN
10.0000 [IU] | Freq: Every day | SUBCUTANEOUS | Status: DC
  Administered 2024-03-08: 10 [IU] via SUBCUTANEOUS
  Filled 2024-03-07 (×2): qty 0.1

## 2024-03-07 MED ORDER — LIDOCAINE 2% (20 MG/ML) 5 ML SYRINGE
INTRAMUSCULAR | Status: DC | PRN
  Administered 2024-03-07: 60 mg via INTRAVENOUS

## 2024-03-07 MED ORDER — INSULIN GLARGINE 100 UNITS/ML SOLOSTAR PEN: 10.0000 [IU] | PEN_INJECTOR | Freq: Every day | SUBCUTANEOUS | Status: DC

## 2024-03-07 MED ORDER — OXYCODONE HCL 5 MG/5ML PO SOLN: 5.0000 mg | Freq: Once | ORAL | Status: DC | PRN

## 2024-03-07 MED ORDER — INSULIN ASPART 100 UNIT/ML IJ SOLN
0.0000 [IU] | INTRAMUSCULAR | Status: AC | PRN
  Administered 2024-03-07: 6 [IU] via SUBCUTANEOUS
  Administered 2024-03-07: 8 [IU] via SUBCUTANEOUS
  Filled 2024-03-07: qty 6
  Filled 2024-03-07: qty 8

## 2024-03-07 MED ORDER — ORAL CARE MOUTH RINSE: 15.0000 mL | Freq: Once | OROMUCOSAL | Status: AC

## 2024-03-07 MED ORDER — ACETAMINOPHEN 500 MG PO TABS
1000.0000 mg | ORAL_TABLET | Freq: Once | ORAL | Status: AC
  Administered 2024-03-07: 1000 mg via ORAL
  Filled 2024-03-07: qty 2

## 2024-03-07 MED ORDER — INSULIN ASPART 100 UNIT/ML IJ SOLN
0.0000 [IU] | INTRAMUSCULAR | Status: DC
  Administered 2024-03-07 – 2024-03-08 (×2): 2 [IU] via SUBCUTANEOUS
  Administered 2024-03-08: 3 [IU] via SUBCUTANEOUS
  Administered 2024-03-08: 2 [IU] via SUBCUTANEOUS
  Administered 2024-03-08: 9 [IU] via SUBCUTANEOUS
  Filled 2024-03-07 (×2): qty 2
  Filled 2024-03-07: qty 9
  Filled 2024-03-07: qty 2
  Filled 2024-03-07: qty 3
  Filled 2024-03-07: qty 0.09

## 2024-03-07 MED ORDER — OXYCODONE HCL 5 MG PO TABS: 5.0000 mg | ORAL_TABLET | Freq: Once | ORAL | Status: DC | PRN

## 2024-03-07 MED ORDER — CEFAZOLIN SODIUM-DEXTROSE 2-4 GM/100ML-% IV SOLN
2.0000 g | INTRAVENOUS | Status: AC
  Administered 2024-03-07: 2 g via INTRAVENOUS
  Filled 2024-03-07: qty 100

## 2024-03-07 MED ORDER — HYDROMORPHONE HCL 1 MG/ML IJ SOLN
INTRAMUSCULAR | Status: DC | PRN
  Administered 2024-03-07 (×2): .5 mg via INTRAVENOUS

## 2024-03-07 MED ORDER — KETAMINE HCL 50 MG/5ML IJ SOSY
PREFILLED_SYRINGE | INTRAMUSCULAR | Status: AC
  Filled 2024-03-07: qty 5

## 2024-03-07 MED ORDER — PROPOFOL 10 MG/ML IV BOLUS
INTRAVENOUS | Status: AC
  Filled 2024-03-07: qty 20

## 2024-03-07 MED ORDER — MIDAZOLAM HCL (PF) 2 MG/2ML IJ SOLN
INTRAMUSCULAR | Status: DC | PRN
  Administered 2024-03-07: 2 mg via INTRAVENOUS

## 2024-03-07 MED ORDER — OXYCODONE-ACETAMINOPHEN 5-325 MG PO TABS
1.0000 | ORAL_TABLET | ORAL | Status: DC | PRN
  Administered 2024-03-07 – 2024-03-14 (×4): 1 via ORAL
  Filled 2024-03-07 (×4): qty 1

## 2024-03-07 MED ORDER — FENTANYL CITRATE (PF) 250 MCG/5ML IJ SOLN
INTRAMUSCULAR | Status: DC | PRN
  Administered 2024-03-07: 50 ug via INTRAVENOUS
  Administered 2024-03-07: 25 ug via INTRAVENOUS
  Administered 2024-03-07: 50 ug via INTRAVENOUS
  Administered 2024-03-07: 75 ug via INTRAVENOUS

## 2024-03-07 MED ORDER — PHENYLEPHRINE HCL-NACL 20-0.9 MG/250ML-% IV SOLN
INTRAVENOUS | Status: DC | PRN
  Administered 2024-03-07: 30 ug/min via INTRAVENOUS

## 2024-03-07 MED ORDER — SODIUM CHLORIDE 0.9 % IV SOLN: INTRAVENOUS | Status: DC

## 2024-03-07 MED ORDER — MIDAZOLAM HCL 2 MG/2ML IJ SOLN
INTRAMUSCULAR | Status: AC
  Filled 2024-03-07: qty 2

## 2024-03-07 SURGICAL SUPPLY — 50 items
BAG COUNTER SPONGE SURGICOUNT (BAG) ×2 IMPLANT
BLADE SAGITTAL 25.0X1.19X90 (BLADE) ×2 IMPLANT
BLADE SAW THK.89X75X18XSGTL (BLADE) IMPLANT
BLADE SURG 21 STRL SS (BLADE) ×2 IMPLANT
BNDG COHESIVE 6X5 TAN ST LF (GAUZE/BANDAGES/DRESSINGS) ×2 IMPLANT
BNDG COMPR ESMARK 6X3 LF (GAUZE/BANDAGES/DRESSINGS) IMPLANT
BNDG ELASTIC 4X5.8 VLCR STR LF (GAUZE/BANDAGES/DRESSINGS) ×2 IMPLANT
BNDG ELASTIC 6INX 5YD STR LF (GAUZE/BANDAGES/DRESSINGS) ×2 IMPLANT
BNDG GAUZE DERMACEA FLUFF 4 (GAUZE/BANDAGES/DRESSINGS) ×2 IMPLANT
BUR DISC 0.8X25 (BURR) IMPLANT
CANISTER SUCTION 3000ML PPV (SUCTIONS) ×2 IMPLANT
CANISTER WOUND CARE 500ML ATS (WOUND CARE) ×2 IMPLANT
CHLORAPREP W/TINT 26 (MISCELLANEOUS) ×2 IMPLANT
COVER SURGICAL LIGHT HANDLE (MISCELLANEOUS) ×2 IMPLANT
CUFF TRNQT CYL 24X4X16.5-23 (TOURNIQUET CUFF) IMPLANT
CUFF TRNQT CYL 34X4.125X (TOURNIQUET CUFF) IMPLANT
DRAIN CHANNEL 19F RND (DRAIN) IMPLANT
DRAPE DERMATAC (DRAPES) IMPLANT
DRAPE HALF SHEET 40X57 (DRAPES) ×2 IMPLANT
DRAPE INCISE IOBAN 66X45 STRL (DRAPES) IMPLANT
DRAPE SURG ORHT 6 SPLT 77X108 (DRAPES) ×4 IMPLANT
DRESSING PREVENA PLUS CUSTOM (GAUZE/BANDAGES/DRESSINGS) IMPLANT
ELECT CAUTERY BLADE 6.4 (BLADE) ×2 IMPLANT
ELECTRODE REM PT RTRN 9FT ADLT (ELECTROSURGICAL) ×2 IMPLANT
EVACUATOR SILICONE 100CC (DRAIN) IMPLANT
GAUZE SPONGE 4X4 12PLY STRL (GAUZE/BANDAGES/DRESSINGS) ×2 IMPLANT
GAUZE XEROFORM 5X9 LF (GAUZE/BANDAGES/DRESSINGS) IMPLANT
GLOVE BIO SURGEON STRL SZ8 (GLOVE) ×2 IMPLANT
GOWN STRL REUS W/ TWL LRG LVL3 (GOWN DISPOSABLE) ×4 IMPLANT
GOWN STRL REUS W/ TWL XL LVL3 (GOWN DISPOSABLE) ×2 IMPLANT
KIT BASIN OR (CUSTOM PROCEDURE TRAY) ×2 IMPLANT
KIT PREVENA INCISION MGT20CM45 (CANNISTER) IMPLANT
KIT TURNOVER KIT B (KITS) ×2 IMPLANT
PACK GENERAL/GYN (CUSTOM PROCEDURE TRAY) ×2 IMPLANT
PAD ARMBOARD POSITIONER FOAM (MISCELLANEOUS) ×4 IMPLANT
PENCIL SMOKE EVACUATOR (MISCELLANEOUS) ×2 IMPLANT
PREVENA RESTOR ARTHOFORM 46X30 (CANNISTER) IMPLANT
SOLN 0.9% NACL POUR BTL 1000ML (IV SOLUTION) ×2 IMPLANT
SOLN STERILE WATER BTL 1000 ML (IV SOLUTION) ×2 IMPLANT
STAPLER SKIN 35 REG (STAPLE) ×2 IMPLANT
STAPLER SKIN PROX 35W (STAPLE) ×2 IMPLANT
STOCKINETTE IMPERVIOUS LG (DRAPES) ×2 IMPLANT
SUT ETHILON 2 0 PSLX (SUTURE) ×4 IMPLANT
SUT ETHILON 3 0 PS 1 (SUTURE) IMPLANT
SUT SILK 0 TIES 10X30 (SUTURE) ×2 IMPLANT
SUT SILK 2 0 SH CR/8 (SUTURE) ×2 IMPLANT
SUT VIC AB 2-0 CT1 18 (SUTURE) ×6 IMPLANT
TAPE UMBILICAL 1/8X30 (MISCELLANEOUS) IMPLANT
TOWEL GREEN STERILE (TOWEL DISPOSABLE) ×4 IMPLANT
UNDERPAD 30X36 HEAVY ABSORB (UNDERPADS AND DIAPERS) ×2 IMPLANT

## 2024-03-07 NOTE — Subjective & Objective (Signed)
 Patient with known history of left lower extremity critical ischemia in November undergone left femoral friable and enterectomy with bovine pericardial patch angioplasty with left femoral-popliteal arterial thrombectomy left posterior tibial arterial thrombectomy left lower extremity calf 4 compartment fasciotomy unfortunately he continued to have severe left lower extremity pain and the decision was made to perform left AKA. Patient undergone left AKA today on 07 March 2024 after it was deemed that there was no other revascularization options He has known history of atrial fibrillation for which he was on Xarelto  which was held.  Patient also has history of diabetes  And diagnosis of left temporal glioblastoma sp resection in November 2025 for which she is followed by neurosurgery Rosslyn Dino HERO, MD and rad/onc   Dr. Lenn and oncology, Dr. [name] The plan was for him to start radiation and chemotherapy

## 2024-03-07 NOTE — Assessment & Plan Note (Signed)
Now status post left AKA.

## 2024-03-07 NOTE — Assessment & Plan Note (Signed)
-   currently appears to be slightly on the dry side, hold home diuretics for tonight and restart when appears euvolemic, carefuly follow fluid status and Cr  

## 2024-03-07 NOTE — Assessment & Plan Note (Signed)
Continue lipitor 80 mg po qday

## 2024-03-07 NOTE — Assessment & Plan Note (Addendum)
 Does not want to use CPAP in the hospital

## 2024-03-07 NOTE — Anesthesia Postprocedure Evaluation (Signed)
 Anesthesia Post Note  Patient: Cristin Szatkowski Bas  Procedure(s) Performed: AMPUTATION, ABOVE KNEE (Left: Knee) APPLICATION, WOUND VAC (Left: Leg Lower)     Patient location during evaluation: PACU Anesthesia Type: General Level of consciousness: awake and alert Pain management: pain level controlled Vital Signs Assessment: post-procedure vital signs reviewed and stable Respiratory status: spontaneous breathing, nonlabored ventilation, respiratory function stable and patient connected to nasal cannula oxygen Cardiovascular status: blood pressure returned to baseline and stable Postop Assessment: no apparent nausea or vomiting Anesthetic complications: no   No notable events documented.  Last Vitals:  Vitals:   03/07/24 2021 03/07/24 2107  BP: (!) 177/76 (!) 146/65  Pulse: (!) 55 (!) 57  Resp: 20 19  Temp: 36.6 C 36.8 C  SpO2: 99% 98%    Last Pain:  Vitals:   03/07/24 2107  TempSrc: Oral  PainSc: 6                  Thom JONELLE Peoples

## 2024-03-07 NOTE — Telephone Encounter (Signed)
 Patient's wife called on 03/06/2024 to report that patient was can have surgery the next day 03/07/2024.  He is scheduled for a leg amputation.  She had concerns about his ability to come in for his CT [facility] as scheduled with radiation oncology next week.  Sent message to radiation oncology provider nurses so they could reach out to patient's wife about the schedule concerns.  Additionally, the leg amputation will affect patient's body weight/BSA and therefore effective dose of Temodar.  Temodar shipment from [employer] ([employer] scheduled for today stopped.  Inform Dr. [name] of patient's upcoming surgery and the effect on his Temodar dose.  Will follow-up with patient's weight/BSA postsurgery to determine new Temodar dose.

## 2024-03-07 NOTE — Assessment & Plan Note (Addendum)
 Patient with history of temporal glioblastoma.  Followed by oncology and radiation oncology he was planning to have radiation sounds like next week May need to have adjustment of plans as patient has to be hospitalized will let oncology team know if patient is at [facility] Continue Keppra  and Decadron  Po chemo on hold until AKA heals

## 2024-03-07 NOTE — Anesthesia Procedure Notes (Signed)
 Procedure Name: LMA Insertion Date/Time: 03/07/2024 5:53 PM  Performed by: Roslynn Waddell LABOR, CRNAPre-anesthesia Checklist: Patient identified, Emergency Drugs available, Suction available and Patient being monitored Patient Re-evaluated:Patient Re-evaluated prior to induction Oxygen Delivery Method: Circle System Utilized Preoxygenation: Pre-oxygenation with 100% oxygen Induction Type: IV induction LMA: LMA with gastric port inserted LMA Size: 4.0 Number of attempts: 1 Placement Confirmation: positive ETCO2 Tube secured with: Tape Dental Injury: Teeth and Oropharynx as per pre-operative assessment  Comments: Atraumatic induction/LMA insertion. Dentition and oral mucosa as per preop.

## 2024-03-07 NOTE — Assessment & Plan Note (Signed)
 Contributing to comorbidity and complicating medical management  Body mass index is 33.89 kg/m.  Nutritional follow up as an out pt would be recommended

## 2024-03-07 NOTE — Assessment & Plan Note (Signed)
 Appreciate vascular surgery consult and help in management Continue pain control Continue Ancef  Wound VAC in place patient will likely need placement for rehab

## 2024-03-07 NOTE — Assessment & Plan Note (Signed)
 Resume Toprol  hold benazepril  given elevated potassium and slight bump in creatinine

## 2024-03-07 NOTE — Assessment & Plan Note (Signed)
 Obtain anemia panel  Transfuse for Hg <7 , rapidly dropping or  if symptomatic

## 2024-03-07 NOTE — Assessment & Plan Note (Signed)
 Will send a message to oncology patient was admitted to [facility]

## 2024-03-07 NOTE — Assessment & Plan Note (Signed)
 In the setting of steroid use

## 2024-03-07 NOTE — H&P (Addendum)
 Douglas Hunter FMW:[id number] DOB: Jun 08, 1955 DOA: 03/07/2024     PCP: [name], MD   [facility]  CARDS:  Dr. Evalene Lunger, MD    Oncology  Dr. [name]   Patient arrived to ER on  at  Referred by Attending Silvester Ales, MD   Patient coming from:    home Lives With family     Chief Complaint: Left leg pain   HPI: IZAIAS KRUPKA is a 68 y.o. male with medical history significant of A-fib on Xarelto , temporal glioblastoma, peripheral vascular disease now status post left AKA, diabetes type 2, HLD    Presented with  left  AKA done today Patient with known history of left lower extremity critical ischemia in November undergone left femoral friable and enterectomy with bovine pericardial patch angioplasty with left femoral-popliteal arterial thrombectomy left posterior tibial arterial thrombectomy left lower extremity calf 4 compartment fasciotomy unfortunately he continued to have severe left lower extremity pain and the decision was made to perform left AKA. Patient undergone left AKA today on 07 March 2024 after it was deemed that there was no other revascularization options He has known history of atrial fibrillation for which he was on Xarelto  which was held.  Patient also has history of diabetes  And diagnosis of left temporal glioblastoma sp resection in November 2025 for which she is followed by neurosurgery Rosslyn Dino HERO, MD and rad/onc   Dr. Lenn and oncology, Dr. [name] The plan was for him to start radiation and chemotherapy    Patient was diagnosed with temporal glioma blastoma in October 2025 when he presented with aphasia He undergone resection with improvement in his speech On Decadrone and keprra and temodar Radiation therapy is on hold for now   Denies significant ETOH intake  Does not smoke  Regarding pertinent Chronic problems:     Hyperlipidemia -  on statins Lipitor  (atorvastatin ) zetia  Lipid Panel      Component Value Date/Time   CHOL 162 01/13/2015 0854   CHOL 112 01/12/2013 0013   TRIG 68 01/31/2024 0234   TRIG 62 01/13/2015 0854   TRIG 80 01/12/2013 0013   HDL 47 01/12/2013 0013   VLDL 12 01/13/2015 0854   VLDL 16 01/12/2013 0013   LDLCALC 49 01/12/2013 0013     HTN on benazepril , toprol    chronic CHF diastolic/ - last echo on Lasix  Recent Results (from the past 56199 hours)  ECHOCARDIOGRAM COMPLETE   Collection Time: 08/03/21  8:05 AM  Result Value   AR max vel 3.05   AV Peak grad 9.8   Ao pk vel 1.57   S' Lateral 4.20   Area-P 1/2 4.29   AV Area VTI 2.82   AV Mean grad 5.5   AV Area mean vel 2.86   Narrative      ECHOCARDIOGRAM REPORT         1. Left ventricular ejection fraction, by estimation, is 60 to 65%. The left ventricle has normal function. The left ventricle has no regional wall motion abnormalities. Left ventricular diastolic parameters are consistent with Grade II diastolic  dysfunction (pseudonormalization).  2. Right ventricular systolic function is normal. The right ventricular size is normal. There is mildly elevated pulmonary artery systolic pressure. The estimated right ventricular systolic pressure is 38.9 mmHg.  3. Left atrial size was moderately dilated.  4. The mitral valve is normal in structure. No evidence of mitral valve regurgitation. No evidence of mitral stenosis. Moderate mitral annular calcification.  5. The aortic valve was not well visualized. Aortic valve regurgitation is not visualized. No aortic stenosis is present.  6. The inferior vena cava is normal in size with greater than 50% respiratory variability, suggesting right atrial pressure of 3 mmHg.           DM 2 -  Lab Results  Component Value Date   HGBA1C 8.0 (H) 01/30/2024   on insulin ,  lantus  25 bid and humalog  on sliding scale     obesity-   BMI Readings from Last 1 Encounters:  03/07/24 33.89 kg/m     OSA -on nocturnal  CPAP,     A. Fib -   atrial  fibrillation CHA2DS2 vas score   4     current  on anticoagulation with Xarelto ,         -  Rate control:  Currently controlled with  Toprolol,      Chronic anemia - baseline hg Hemoglobin & Hematocrit  Recent Labs    02/12/24 0704 02/13/24 0328 03/07/24 1405  HGB 10.4* 10.1* 10.8*   Iron/TIBC/Ferritin/ %Sat No results found for: IRON, TIBC, FERRITIN, IRONPCTSAT          Lab Orders         Urinalysis, Routine w reflex microscopic -Urine, Clean Catch         APTT         CBC         Protime-INR         Glucose, capillary         Comprehensive metabolic panel with GFR         Glucose, capillary         Glucose, capillary        Following Medications were ordered in ER: Medications  0.9 %  sodium chloride  infusion (has no administration in time range)  Chlorhexidine  Gluconate Cloth 2 % PADS 6 each (has no administration in time range)    And  Chlorhexidine  Gluconate Cloth 2 % PADS 6 each (has no administration in time range)  lactated ringers  infusion ( Intravenous Restarted 03/07/24 1819)  oxyCODONE  (Oxy IR/ROXICODONE ) immediate release tablet 5 mg (has no administration in time range)    Or  oxyCODONE  (ROXICODONE ) 5 MG/5ML solution 5 mg (has no administration in time range)  fentaNYL  (SUBLIMAZE ) injection 25-50 mcg (50 mcg Intravenous Given 03/07/24 1846)  ondansetron  (ZOFRAN ) injection 4 mg (has no administration in time range)  oxyCODONE -acetaminophen  (PERCOCET/ROXICET) 5-325 MG per tablet 1-2 tablet (has no administration in time range)  HYDROmorphone  (DILAUDID ) injection 0.5-1 mg (has no administration in time range)  acetaminophen  (TYLENOL ) tablet 650 mg (has no administration in time range)  ceFAZolin  (ANCEF ) IVPB 1 g/50 mL premix (has no administration in time range)  ceFAZolin  (ANCEF ) IVPB 2g/100 mL premix (2 g Intravenous Given 03/07/24 1702)  chlorhexidine  (PERIDEX ) 0.12 % solution 15 mL (15 mLs Mouth/Throat Given 03/07/24 1413)    Or  Oral care mouth  rinse ( Mouth Rinse See Alternative 03/07/24 1413)  insulin  aspart (novoLOG ) injection 0-14 Units (6 Units Subcutaneous Given 03/07/24 1636)  acetaminophen  (TYLENOL ) tablet 1,000 mg (1,000 mg Oral Given 03/07/24 1413)    ____    ED Triage Vitals  Encounter Vitals Group     BP 03/07/24 1320 (!) 152/78     Girls Systolic BP Percentile --      Girls Diastolic BP Percentile --      Boys Systolic BP Percentile --      Boys Diastolic  BP Percentile --      Pulse Rate 03/07/24 1320 61     Resp 03/07/24 1320 17     Temp 03/07/24 1320 98 F (36.7 C)     Temp Source 03/07/24 1320 Oral     SpO2 03/07/24 1320 96 %     Weight 03/07/24 1320 243 lb (110.2 kg)     Height 03/07/24 1320 5' 11 (1.803 m)     Head Circumference --      Peak Flow --      Pain Score 03/07/24 1343 5     Pain Loc --      Pain Education --      Exclude from Growth Chart --   UFJK(75)@     _________________________________________ Significant initial  Findings: Abnormal Labs Reviewed  CBC - Abnormal; Notable for the following components:      Result Value   WBC 14.9 (*)    RBC 3.51 (*)    Hemoglobin 10.8 (*)    HCT 34.2 (*)    All other components within normal limits  GLUCOSE, CAPILLARY - Abnormal; Notable for the following components:   Glucose-Capillary 295 (*)    All other components within normal limits  COMPREHENSIVE METABOLIC PANEL WITH GFR - Abnormal; Notable for the following components:   Glucose, Bld 312 (*)    BUN 25 (*)    Calcium  8.2 (*)    Total Protein 6.2 (*)    Albumin  2.4 (*)    Total Bilirubin 1.6 (*)    All other components within normal limits  GLUCOSE, CAPILLARY - Abnormal; Notable for the following components:   Glucose-Capillary 229 (*)    All other components within normal limits  GLUCOSE, CAPILLARY - Abnormal; Notable for the following components:   Glucose-Capillary 223 (*)    All other components within normal limits     ECG: Ordered    The recent clinical data is shown  below. Vitals:   03/07/24 1830 03/07/24 1845 03/07/24 1846 03/07/24 1900  BP: (!) 183/77 (!) 160/77 (!) 160/77 (!) 160/90  Pulse: 65 62 62 (!) 52  Resp: 11 14 12 14   Temp:      TempSrc:      SpO2: 95% 91% 94% 92%  Weight:      Height:          WBC     Component Value Date/Time   WBC 14.9 (H) 03/07/2024 1405   LYMPHSABS 0.9 02/09/2024 1347   LYMPHSABS 2.0 10/26/2016 1423   LYMPHSABS 1.5 03/02/2014 1416   MONOABS 0.6 02/09/2024 1347   MONOABS 1.3 (H) 03/02/2014 1416   EOSABS 0.0 02/09/2024 1347   EOSABS 0.3 10/26/2016 1423   EOSABS 0.3 03/02/2014 1416   BASOSABS 0.0 02/09/2024 1347   BASOSABS 0.0 10/26/2016 1423   BASOSABS 0.1 03/02/2014 1416      UA not ordered    Results for orders placed or performed in visit on 05/29/20  Microscopic Examination     Status: None   Collection Time: 05/29/20  8:18 AM   Urine  Result Value Ref Range Status   WBC, UA None seen 0 - 5 /hpf Final   RBC, Urine 0-2 0 - 2 /hpf Final   Epithelial Cells (non renal) None seen 0 - 10 /hpf Final   Bacteria, UA None seen None seen/Few Final    ABX started Antibiotics Given (last 72 hours)     Date/Time Action Medication Dose   03/07/24 1702 Given  ceFAZolin  (ANCEF ) IVPB 2g/100 mL premix 2 g       __________________________________________________________ Recent Labs  Lab 03/07/24 1441  NA 135  K 5.1  CO2 22  GLUCOSE 312*  BUN 25*  CREATININE 1.19  CALCIUM  8.2*    Cr Up from baseline see below Lab Results  Component Value Date   CREATININE 1.19 03/07/2024   CREATININE 0.98 02/13/2024   CREATININE 1.36 (H) 02/12/2024    Recent Labs  Lab 03/07/24 1441  AST 24  ALT 18  ALKPHOS 110  BILITOT 1.6*  PROT 6.2*  ALBUMIN  2.4*   Lab Results  Component Value Date   CALCIUM  8.2 (L) 03/07/2024   PHOS 3.5 02/12/2024    Plt: Lab Results  Component Value Date   PLT 358 03/07/2024       Recent Labs  Lab 03/07/24 1405  WBC 14.9*  HGB 10.8*  HCT 34.2*  MCV 97.4  PLT  358    HG/HCT   stable,     Component Value Date/Time   HGB 10.8 (L) 03/07/2024 1405   HGB 13.8 06/28/2023 0946   HCT 34.2 (L) 03/07/2024 1405   HCT 41.7 06/28/2023 0946   MCV 97.4 03/07/2024 1405   MCV 93 06/28/2023 0946   MCV 95 03/02/2014 1416    _______________________________________________ Hospitalist was called for admission for   Acute lower limb ischemia sp left AKA   The following Work up has been ordered so far:  Orders Placed This Encounter  Procedures   Urinalysis, Routine w reflex microscopic -Urine, Clean Catch   APTT   CBC   Protime-INR   Glucose, capillary   Comprehensive metabolic panel with GFR   Glucose, capillary   Glucose, capillary   Diet regular Fluid consistency: Thin   Informed Consent Details: Physician/Practitioner Attestation; Transcribe to consent form and obtain patient signature   Initiate Pre-op Protocol   Void on call to OR   Pre-admission testing diagnosis   Informed Consent Details: Physician/Practitioner Attestation; Transcribe to consent form and obtain patient signature   CBG on all patients with diabetes   Prep/Clip for FPBG procedures: groin to toes on specified leg   6 CHG cloth bath night before surgery   6 CHG cloth bath AM of surgery   Chest X-Ray per Anesthesia   EKG per anesthesia   Vital signs Per Protocol   Notify physician (specify)   Diet Per Protocol   Pre-admission testing diagnosis   Medication Instructions Per Protocol   Discharge instructions   Void   IV Fluids Per Protocol   May use Lidocaine  1% - 2% subcutaneously prior to IV start if patient not allergic to Lidocaine    Follow Diabetes Medication Adjustment Guidelines Prior to Procedure and Surgery   Anesthesia Preoperative Order   Discharge  per PACU criteria   Vital signs   Cardiac monitoring   Notify physician (specify) Notify physician for pulse less than 60 or greater than 150, respiratory rate less than 12 or greater than 35, temperature greater  than 38.5, urinary output less than .5 mL/kg/hr, systolic BP less than 50 or greater than...   Notify physician (specify) For blood glucose <90 or >250 (after administering insulin ).   Notify physician (specify)   Cardiac Monitoring - Continuous Indefinite   Cardiac Monitoring Continuous x 24 hours Indications for use: Other; Other indications for use: hx of a.fib   Consult rehab md for ip rehab   OT eval and treat   PT eval and treat  Continuous pulse oximetry   Oxygen therapy Mode or (Route): Nasal cannula; Keep O2 saturation between: >92%   Pulse oximetry, continuous   Oxygen therapy Mode or (Route): Nasal cannula   Type and screen   Insert and maintain IV line   Insert and maintain IV line   Admit to Inpatient (patient's expected length of stay will be greater than 2 midnights or inpatient only procedure)   Admit to Inpatient (patient's expected length of stay will be greater than 2 midnights or inpatient only procedure)     OTHER Significant initial  Findings:  labs showing:     DM  labs:  HbA1C: Recent Labs    01/30/24 2126  HGBA1C 8.0*    CBG (last 3)  Recent Labs    03/07/24 1323 03/07/24 1631 03/07/24 1817  GLUCAP 295* 229* 223*          Cultures: No results found for: SDES, SPECREQUEST, CULT, REPTSTATUS   Radiological Exams on Admission: No results found. _______________________________________________________________________________________________________ Latest  Blood pressure (!) 160/90, pulse (!) 52, temperature 98 F (36.7 C), resp. rate 14, height 5' 11 (1.803 m), weight 110.2 kg, SpO2 92%.   Vitals  labs and radiology finding personally reviewed  Review of Systems:    Pertinent positives include: left leg pain  Constitutional:  No weight loss, night sweats, Fevers, chills, fatigue, weight loss  HEENT:  No headaches, Difficulty swallowing,Tooth/dental problems,Sore throat,  No sneezing, itching, ear ache, nasal congestion, post  nasal drip,  Cardio-vascular:  No chest pain, Orthopnea, PND, anasarca, dizziness, palpitations.no Bilateral lower extremity swelling  GI:  No heartburn, indigestion, abdominal pain, nausea, vomiting, diarrhea, change in bowel habits, loss of appetite, melena, blood in stool, hematemesis Resp:  no shortness of breath at rest. No dyspnea on exertion, No excess mucus, no productive cough, No non-productive cough, No coughing up of blood.No change in color of mucus.No wheezing. Skin:  no rash or lesions. No jaundice GU:  no dysuria, change in color of urine, no urgency or frequency. No straining to urinate.  No flank pain.  Musculoskeletal:  No joint pain or no joint swelling. No decreased range of motion. No back pain.  Psych:  No change in mood or affect. No depression or anxiety. No memory loss.  Neuro: no localizing neurological complaints, no tingling, no weakness, no double vision, no gait abnormality, no slurred speech, no confusion  All systems reviewed and apart from HOPI all are negative _______________________________________________________________________________________________ Past Medical History:   Past Medical History:  Diagnosis Date   AKI (acute kidney injury) 03/13/2020   Atrial flutter (HCC)    Coronary artery disease 08/2011   a. NSTEMI 08/2011, LHC w/ severe disease of LCx with good collaterals, lesion was high-risk for intervention due to a steep angulation of the LCx coming from the left main coronary artery as well as heavy calcifications, discused at cath conference w/ consensus advising med Rx; b. 03/2020 Cath: LM nl, LAD 9m, LCX 100p w/ R->L and L->L collats. RCA dominant, nl. EF 55%. PA 33/6/19, PCWP 8.   GERD (gastroesophageal reflux disease)    H/O brain tumor 01/2024   History of echocardiogram    a. 01/2020 Echo: EF 60-65%, no rwma. Sev elev PASP. Mod dil LA.   Hyperlipidemia    Hypertension    Myocardial infarction [facility])    Paroxysmal atrial  fibrillation (HCC)    a. s/p ablation 01/2013; b CHADS2VASc => 3 (HTN, DM, vascular disease); c. on Coumadin ; d. 12/2019 Amio d/c'd  2/2 hyperthyroidism.   Poorly controlled diabetes mellitus (HCC)    RBBB    Sleep apnea    uses nightly      Past Surgical History:  Procedure Laterality Date   ABLATION  01/29/2013   PVI and CTI by Dr Kelsie for atrial flutter and paroxysmal atrial fibrillation   ANTERIOR CERVICAL DECOMP/DISCECTOMY FUSION N/A 04/18/2018   Procedure: ANTERIOR CERVICAL DECOMPRESSION/DISCECTOMY FUSION 2 LEVELS C5-7;  Surgeon: Clois Fret, MD;  Location: [facility] ORS;  Service: Neurosurgery;  Laterality: N/A;   APPLICATION OF CRANIAL NAVIGATION Left 01/29/2024   Procedure: COMPUTER-ASSISTED NAVIGATION, FOR CRANIAL PROCEDURE;  Surgeon: Rosslyn Dino HERO, MD;  Location: [facility] OR;  Service: Neurosurgery;  Laterality: Left;   ATRIAL FIBRILLATION ABLATION N/A 01/29/2013   Procedure: ATRIAL FIBRILLATION ABLATION;  Surgeon: Lynwood JONETTA Kelsie, MD;  Location: [facility] CATH LAB;  Service: Cardiovascular;  Laterality: N/A;   ATRIAL FIBRILLATION ABLATION N/A 11/15/2021   Procedure: ATRIAL FIBRILLATION ABLATION;  Surgeon: Cindie Ole DASEN, MD;  Location: [facility] INVASIVE CV LAB;  Service: Cardiovascular;  Laterality: N/A;   CARDIAC CATHETERIZATION  08/03/2011   COLONOSCOPY WITH PROPOFOL  N/A 02/02/2016   Procedure: COLONOSCOPY WITH PROPOFOL ;  Surgeon: Rogelia Copping, MD;  Location: [facility] ENDOSCOPY;  Service: Endoscopy;  Laterality: N/A;   COLONOSCOPY WITH PROPOFOL  N/A 04/20/2023   Procedure: COLONOSCOPY WITH PROPOFOL ;  Surgeon: Copping Rogelia, MD;  Location: [facility] ENDOSCOPY;  Service: Endoscopy;  Laterality: N/A;   CRANIOTOMY Left 01/29/2024   Procedure: CRANIOTOMY TUMOR EXCISION;  Surgeon: Rosslyn Dino HERO, MD;  Location: [facility] OR;  Service: Neurosurgery;  Laterality: Left;  LEFT TEMPORAL MASS RESCETION   ENDARTERECTOMY FEMORAL Left 02/09/2024   Procedure: LEFT FEMORAL ENDARTERECTOMY;  Surgeon: Magda Debby SAILOR, MD;   Location: [facility] OR;  Service: Vascular;  Laterality: Left;   FASCIECTOMY, LOWER EXTREMITY Left 02/09/2024   Procedure: 4 COMPARTMENT FASCIECTOMY, LEFT LOWER EXTREMITY;  Surgeon: Magda Debby SAILOR, MD;  Location: [facility] OR;  Service: Vascular;  Laterality: Left;   LOWER EXTREMITY ANGIOGRAM Left 02/09/2024   Procedure: INTRAOPERATIVE LEFT LOWER EXTREMITY ANGIOGRAM;  Surgeon: Magda Debby SAILOR, MD;  Location: [facility] OR;  Service: Vascular;  Laterality: Left;   LUMBAR LAMINECTOMY/DECOMPRESSION MICRODISCECTOMY N/A 06/07/2017   Procedure: LUMBAR LAMINECTOMY/DECOMPRESSION MICRODISCECTOMY 2 LEVELS-L3-4,L4-5;  Surgeon: Clois Fret, MD;  Location: [facility] ORS;  Service: Neurosurgery;  Laterality: N/A;   PATCH ANGIOPLASTY  02/09/2024   Procedure: ANGIOPLASTY LEFT FEMORAL ARTERY USING XENOSURE BOVINE PERICARDIUM PATCH;  Surgeon: Magda Debby SAILOR, MD;  Location: [facility] OR;  Service: Vascular;;   POLYPECTOMY  04/20/2023   Procedure: POLYPECTOMY;  Surgeon: Copping Rogelia, MD;  Location: [facility] ENDOSCOPY;  Service: Endoscopy;;   RIGHT/LEFT HEART CATH AND CORONARY ANGIOGRAPHY N/A 03/06/2020   Procedure: RIGHT/LEFT HEART CATH AND CORONARY ANGIOGRAPHY;  Surgeon: Perla Evalene PARAS, MD;  Location: [facility] INVASIVE CV LAB;  Service: Cardiovascular;  Laterality: N/A;   TEE WITHOUT CARDIOVERSION N/A 01/28/2013   Procedure: TRANSESOPHAGEAL ECHOCARDIOGRAM (TEE);  Surgeon: Redell GORMAN Shallow, MD;  Location: [facility] ENDOSCOPY;  Service: Cardiovascular;  Laterality: N/A;   THROMBECTOMY OF BYPASS GRAFT FEMORAL- PERONEAL ARTERY Left 02/09/2024   Procedure: THROMBECTOMY OF LEFT FEMORAL AND POTERIOR TIBIAL  ARTERY;  Surgeon: Magda Debby SAILOR, MD;  Location: [facility] OR;  Service: Vascular;  Laterality: Left;    Social History:  Ambulatory  independently    reports that he quit smoking about 16 years ago. His smoking use included cigarettes. He started smoking about 51 years ago. He has a 52.5 pack-year smoking history. He has never used smokeless tobacco.  He reports  that he does not currently use alcohol. He reports that he does not use drugs. Family History:   Family History  Problem Relation Age of Onset   Stroke Mother    Heart disease Mother        pacemaker   Heart attack Father 51   Throat cancer Father        throat   Heart disease Paternal Grandfather        MI   Diabetes Son    Heart attack Maternal Uncle    ______________________________________________________________________________________________ Allergies: No Known Allergies   Prior to Admission medications   Medication Sig Start Date End Date Taking? Authorizing Provider  acetaminophen  (TYLENOL ) 500 MG tablet Take 500 mg by mouth 4 (four) times daily as needed for mild pain or moderate pain.   Yes [provider]  atorvastatin  (LIPITOR ) 80 MG tablet Take 80 mg by mouth at bedtime.   Yes [provider]  benazepril  (LOTENSIN ) 40 MG tablet TAKE 1 TABLET BY MOUTH ONCE DAILY Patient taking differently: Take 40 mg by mouth daily. 02/03/23  Yes Gollan, Timothy J, MD  cholecalciferol  (VITAMIN D3) 25 MCG (1000 UNIT) tablet Take 500 Units by mouth daily.   Yes [provider]  dexamethasone  (DECADRON ) 2 MG tablet Take 1 tablet (2 mg total) by mouth daily. 02/14/24 03/15/24 Yes Leotis Bogus, MD  dofetilide  (TIKOSYN ) 500 MCG capsule TAKE 1 CAPSULE BY MOUTH TWICE DAILY 09/22/23  Yes Cindie Ole DASEN, MD  ezetimibe  (ZETIA ) 10 MG tablet TAKE 1 TABLET BY MOUTH ONCE DAILY 03/04/24  Yes Gollan, Timothy J, MD  furosemide  (LASIX ) 20 MG tablet TAKE 1 TABLET BY MOUTH TWICE DAILY AS NEEDED Patient taking differently: Take 20 mg by mouth daily. 12/29/22  Yes Gollan, Timothy J, MD  insulin  lispro (HUMALOG ) 100 UNIT/ML KiwkPen Inject 16 Units into the skin 3 (three) times daily. Sliding Scale 12/13/17 05/27/28 Yes [provider]  JARDIANCE 25 MG TABS tablet Take 12.5 mg by mouth daily.   Yes [provider]  LANTUS  SOLOSTAR 100 UNIT/ML Solostar Pen Inject 25  Units into the skin 2 (two) times daily. 03/04/24  Yes [provider]  levETIRAcetam  (KEPPRA ) 500 MG tablet Take 1 tablet (500 mg total) by mouth 2 (two) times daily. 03/04/24 04/03/24 Yes Janjua, Rashid M, MD  metoprolol  succinate (TOPROL -XL) 25 MG 24 hr tablet TAKE 1 TABLET BY MOUTH ONCE DAILY 02/06/24  Yes Gollan, Timothy J, MD  nitroGLYCERIN  (NITROSTAT ) 0.4 MG SL tablet Place 1 tablet (0.4 mg total) under the tongue every 5 (five) minutes as needed for chest pain. 07/31/23  Yes Gollan, Timothy J, MD  OVER THE COUNTER MEDICATION Take 1 capsule by mouth daily. Omega XL   Yes [provider]  oxyCODONE -acetaminophen  (PERCOCET/ROXICET) 5-325 MG tablet Take 1 tablet by mouth every 4 (four) hours as needed for severe pain (pain score 7-10). 03/05/24  Yes Baglia, Corrina, PA-C  pantoprazole  (PROTONIX ) 40 MG tablet Take 1 tablet (40 mg total) by mouth daily. Stop taking if no longer on decadron . 03/04/24  Yes Janjua, Rashid M, MD  rivaroxaban  (XARELTO ) 20 MG TABS tablet Take 1 tablet (20 mg total) by mouth daily with supper. 02/08/24  Yes Wilson, Tara N, PA-C  baclofen  (LIORESAL ) 10 MG tablet Take 10 mg by mouth 2 (two) times daily as needed (neck pain). Patient not taking: Reported on 03/06/2024    [provider]  butalbital -acetaminophen -caffeine  (BAC, BUTALBITAL -ACETAMIN-CAFF,) 50-325-40 MG tablet Take 2 tablets by mouth every  4 (four) hours as needed for headache. Patient not taking: Reported on 03/06/2024    [provider]  Continuous Blood Gluc Sensor (FREESTYLE LIBRE 14 DAY SENSOR) MISC SMARTSIG:1 Kit(s) Topical Every 2 Weeks 03/29/20   [provider]  INSULIN  SYRINGE 1CC/29G 29G X 1/2 1 ML MISC USE AS DIRECTED 05/27/15   Daphane Rosella, NP  ondansetron  (ZOFRAN ) 8 MG tablet Take 1 tablet (8 mg total) by mouth every 8 (eight) hours as needed for nausea or vomiting. May take 30-60 minutes prior to Temodar administration if nausea/vomiting occurs as  needed. Patient not taking: Reported on 03/06/2024 03/13/24   Vaslow, Zachary K, MD  ONE [facility] ULTRA TEST test strip TEST THREE TIMES A DAY AS DIRECTED. 02/04/15   Daphane Rosella, NP    ___________________________________________________________________________________________________ Physical Exam:    03/07/2024    7:00 PM 03/07/2024    6:46 PM 03/07/2024    6:45 PM  Vitals with BMI  Systolic 160 160 839  Diastolic 90 77 77  Pulse 52 62 62     1. General:  in No  Acute distress    Chronically ill   -appearing 2. Psychological: Alert and   Oriented 3. Head/ENT:    Dry Mucous Membranes                          Head Non traumatic, neck supple                       Poor Dentition 4. SKIN: decreased Skin turgor,  Skin clean Dry and intact no rash    5. Heart: Regular rate and rhythm no  Murmur, no Rub or gallop 6. Lungs:   no wheezes or crackles   7. Abdomen: Soft,  non-tender, Non distended   obese  bowel sounds present 8. Lower extremities: no clubbing, cyanosis, left AKA with wound Vac 9. Neurologically Grossly intact, moving all 4 extremities equally  10. MSK: Normal range of motion    Chart has been reviewed  ______________________________________________________________________________________________  Assessment/Plan  68 y.o. male with medical history significant of A-fib on Xarelto , temporal glioblastoma, peripheral vascular disease now status post left AKA, diabetes type 2, HLD  Admitted for   Acute lower limb ischemia     Present on Admission:  Critical limb ischemia of left lower extremity (HCC)  Acute lower limb ischemia  Atrial flutter (HCC)  Brain tumor (HCC)  Glioblastoma (HCC)  HTN (hypertension)  Leukocytosis  Type 2 diabetes mellitus with hyperglycemia (HCC)  Anemia  Coronary artery disease  HLD (hyperlipidemia)  Obesity, unspecified  Chronic diastolic CHF (congestive heart failure) (HCC)     Above knee amputation of left lower extremity  (HCC) Appreciate vascular surgery consult and help in management Continue pain control Continue Ancef  Wound VAC in place patient will likely need placement for rehab  Acute lower limb ischemia Now status post left AKA  Atrial flutter (HCC) History of A-fib a flutter.  Current continue Toprol  and Tikosyn  Obtain EKG monitor on telemetry Hold Xarelto  for now as per vascular surgery recommendations  Brain tumor [facility]) Patient with history of temporal glioblastoma.  Followed by oncology and radiation oncology he was planning to have radiation sounds like next week May need to have adjustment of plans as patient has to be hospitalized will let oncology team know if patient is at [facility] Continue Keppra  and Decadron  Po chemo on hold until AKA heals  Glioblastoma (HCC) Will  send a message to oncology patient was admitted to [facility]  HTN (hypertension) Resume Toprol  hold benazepril  given elevated potassium and slight bump in creatinine  OSA on CPAP Does not want to use CPAP in the hospital  Leukocytosis In the setting of steroid use  Type 2 diabetes mellitus with hyperglycemia (HCC) Order sliding scale changed Lantus  to 20 units SQ nightly and 10 units in AM hold p.o. medications  Anemia Obtain anemia panel  Transfuse for Hg <7 , rapidly dropping or  if symptomatic   Coronary artery disease On xarelto  so no aspirin  On lipitor  and toprol    HLD (hyperlipidemia) Continue lipitor  80 mg po q day  Obesity, unspecified Contributing to comorbidity and complicating medical management  Body mass index is 33.89 kg/m.  Nutritional follow up as an out pt would be recommended   Chronic diastolic CHF (congestive heart failure) (HCC) - currently appears to be slightly on the dry side, hold home diuretics for tonight and restart when appears euvolemic, carefuly follow fluid status and Cr     Other plan as per orders.  DVT prophylaxis:  SCD      Code Status:    Code  Status: Prior FULL CODE   as per patient   I had personally discussed CODE STATUS with patient and family   ACP   none    Family Communication:   Family not at  Bedside  plan of care was discussed on the phone with     Diet  Diet Orders (From admission, onward)     Start     Ordered   03/07/24 1950  Diet heart healthy/carb modified Room service appropriate? Yes; Fluid consistency: Thin  Diet effective now       Question Answer Comment  Diet-HS Snack? Nothing   Room service appropriate? Yes   Fluid consistency: Thin      03/07/24 1949            Disposition Plan:     likely will need placement for rehabilitation                       Following barriers for discharge:                                                   Electrolytes corrected                               Anemia corrected h/H stable                             Pain controlled with PO medications                               Afebrile, white count improving able to transition to PO antibiotics                             Will need to be able to tolerate PO                            Will likely need  home health, home O2, set up                           Will need consultants to evaluate patient prior to discharge                           Work of breathing improves       Consult Orders  (From admission, onward)           Start     Ordered   03/08/24 0700  PT eval and treat  Start Tomorrow       Question:  Reason for PT?  Answer:  L AKA   03/07/24 1833   03/08/24 0700  OT eval and treat  Start Tomorrow       Question:  Reason for OT?  Answer:  L AKA   03/07/24 1833                               Would benefit from PT/OT eval prior to DC  Ordered                     Consults called: Vascular surgery is aware   Admission status:  ED Disposition     None          inpatient     I Expect 2 midnight stay secondary to severity of patient's current illness need for inpatient interventions  justified by the following:    Severe lab/radiological/exam abnormalities including:    Acute lower limb ischemia  Sp amputation  and extensive comorbidities including:    DM2        malignancy,   History of amputation Chronic anticoagulation  That are currently affecting medical management.   I expect  patient to be hospitalized for 2 midnights requiring inpatient medical care.  Patient is at high risk for adverse outcome (such as loss of life or disability) if not treated.  Indication for inpatient stay as follows:   severe pain requiring acute inpatient management,       Need for operative/procedural  intervention    Need for IV antibiotics, IV fluids,, IV pain medications,      Level of care     tele  For   24H       Yoltzin Ransom 03/07/2024, 8:21 PM    [facility]     after 2 AM please page floor coverage   If 7AM-7PM, please contact the day team taking care of the patient using [email or web address]

## 2024-03-07 NOTE — Transfer of Care (Signed)
 Immediate Anesthesia Transfer of Care Note  Patient: Douglas Hunter  Procedure(s) Performed: AMPUTATION, ABOVE KNEE (Left: Knee) APPLICATION, WOUND VAC (Left: Leg Lower)  Patient Location: PACU  Anesthesia Type:General  Level of Consciousness: awake and alert   Airway & Oxygen Therapy: Patient Spontanous Breathing  Post-op Assessment: Report given to RN and Post -op Vital signs reviewed and stable  Post vital signs: Reviewed and stable  Last Vitals:  Vitals Value Taken Time  BP 168/69 03/07/24 18:17  Temp    Pulse 62 03/07/24 18:22  Resp 12 03/07/24 18:22  SpO2 97 % 03/07/24 18:22  Vitals shown include unfiled device data.  Last Pain:  Vitals:   03/07/24 1815  TempSrc:   PainSc: Asleep      Patients Stated Pain Goal: 4 (03/07/24 1343)  Complications: No notable events documented.

## 2024-03-07 NOTE — Assessment & Plan Note (Signed)
 On xarelto  so no aspirin  On lipitor  and toprol 

## 2024-03-07 NOTE — Assessment & Plan Note (Addendum)
 Order sliding scale changed Lantus  to 20 units SQ nightly and 10 units in AM hold p.o. medications

## 2024-03-07 NOTE — Assessment & Plan Note (Signed)
 History of A-fib a flutter.  Current continue Toprol  and Tikosyn  Obtain EKG monitor on telemetry Hold Xarelto  for now as per vascular surgery recommendations

## 2024-03-07 NOTE — Interval H&P Note (Signed)
 History and Physical Interval Note:  03/07/2024 4:03 PM  Marcey SHAUNNA Brighter  has presented today for surgery, with the diagnosis of CLI LLE.  The various methods of treatment have been discussed with the patient and family. After consideration of risks, benefits and other options for treatment, the patient has consented to  Procedure(s): AMPUTATION, ABOVE KNEE (Left) as a surgical intervention.  The patient's history has been reviewed, patient examined, no change in status, stable for surgery.  I have reviewed the patient's chart and labs.  Questions were answered to the patient's satisfaction.     Debby LOISE Robertson

## 2024-03-07 NOTE — Op Note (Signed)
 DATE OF SERVICE: 03/07/2024  PATIENT:  Douglas Hunter  68 y.o. male  PRE-OPERATIVE DIAGNOSIS:  non-salvageable left lower extremity  POST-OPERATIVE DIAGNOSIS:  Same  PROCEDURE:   Left above knee amputation  SURGEON:  Surgeons and Role:    * Magda Debby SAILOR, MD - Primary  ASSISTANT: Adina Sender, PA-C  An experienced assistant was required given the complexity of this procedure and the standard of surgical care. My assistant helped with exposure through counter tension, suctioning, ligation and retraction to better visualize the surgical field.  My assistant expedited sewing during the case by following my sutures. Wherever I use the term we in the report, my assistant actively helped me with that portion of the procedure.  ANESTHESIA:   general  EBL:  BLOOD ADMINISTERED:none  DRAINS: none   LOCAL MEDICATIONS USED:  NONE  SPECIMEN:  residual left leg to pathology  COUNTS: confirmed correct.  TOURNIQUET:  none  PATIENT DISPOSITION:  PACU - hemodynamically stable.   Delay start of Pharmacological VTE agent (>24hrs) due to surgical blood loss or risk of bleeding: no  INDICATION FOR PROCEDURE: Douglas Hunter is a 68 y.o. male with non-salvageable left lower extremity. After careful discussion of risks, benefits, and alternatives the patient was offered left above knee amputation. The patient understood and wished to proceed.  OPERATIVE FINDINGS: amputation margin healthy. Good result from AKA.  DESCRIPTION OF PROCEDURE: After identification of the patient in the pre-operative holding area, the patient was transferred to the operating room. The patient was positioned supine on the operating room table. Anesthesia was induced. The left leg was prepped and draped in standard fashion. A surgical pause was performed confirming correct patient, procedure, and operative location.  A left thigh fishmouth incision was planned on the left lower extremity with a skin marker.  An incision was made as planned. The incision was carried down with a #21 scalpel through the subcutaneous tissue, and through the posterior and anterior compartments of the thigh until the femur was encountered. The periosteum about the femur was elevated as high as possible. The femur was transected with a powered saw. The vascular bundle was found in its typical position, and was ligated proximally using a 2-0 silk suture.  The sciatic nerve was identified in typical position, retracted, transected at the retracted margin, and ligated with a 2-0 silk suture.  The tourniquet was released. Hemostasis was achieved in the surgical bed. The wound was copiously irrigated. The wound was closed in layers using 2-0 Vicryl,  surgical stapler.  A prevena vac was applied.  Upon completion of the case instrument and sharps counts were confirmed correct. The patient was transferred to the PACU in good condition. I was present for all portions of the procedure.  FOLLOW UP PLAN: follow up in 4 weeks for staple removal.  Debby SAILOR. Magda, MD [facility] Phone Number: [phone] 03/07/2024 6:04 PM

## 2024-03-08 ENCOUNTER — Encounter (HOSPITAL_COMMUNITY): Payer: Self-pay | Admitting: Vascular Surgery

## 2024-03-08 LAB — CBC
HCT: 31.5 % — ABNORMAL LOW (ref 39.0–52.0)
Hemoglobin: 10.1 g/dL — ABNORMAL LOW (ref 13.0–17.0)
MCH: 30.8 pg (ref 26.0–34.0)
MCHC: 32.1 g/dL (ref 30.0–36.0)
MCV: 96 fL (ref 80.0–100.0)
Platelets: 349 K/uL (ref 150–400)
RBC: 3.28 MIL/uL — ABNORMAL LOW (ref 4.22–5.81)
RDW: 15.2 % (ref 11.5–15.5)
WBC: 13.7 K/uL — ABNORMAL HIGH (ref 4.0–10.5)
nRBC: 0 % (ref 0.0–0.2)

## 2024-03-08 LAB — GLUCOSE, CAPILLARY
Glucose-Capillary: 150 mg/dL — ABNORMAL HIGH (ref 70–99)
Glucose-Capillary: 172 mg/dL — ABNORMAL HIGH (ref 70–99)
Glucose-Capillary: 182 mg/dL — ABNORMAL HIGH (ref 70–99)
Glucose-Capillary: 239 mg/dL — ABNORMAL HIGH (ref 70–99)
Glucose-Capillary: 381 mg/dL — ABNORMAL HIGH (ref 70–99)
Glucose-Capillary: 480 mg/dL — ABNORMAL HIGH (ref 70–99)

## 2024-03-08 LAB — APTT: aPTT: 32 s (ref 24–36)

## 2024-03-08 LAB — PHOSPHORUS: Phosphorus: 3.6 mg/dL (ref 2.5–4.6)

## 2024-03-08 LAB — COMPREHENSIVE METABOLIC PANEL WITH GFR
ALT: 18 U/L (ref 0–44)
AST: 19 U/L (ref 15–41)
Albumin: 2.2 g/dL — ABNORMAL LOW (ref 3.5–5.0)
Alkaline Phosphatase: 90 U/L (ref 38–126)
Anion gap: 9 (ref 5–15)
BUN: 23 mg/dL (ref 8–23)
CO2: 26 mmol/L (ref 22–32)
Calcium: 8.3 mg/dL — ABNORMAL LOW (ref 8.9–10.3)
Chloride: 102 mmol/L (ref 98–111)
Creatinine, Ser: 1.19 mg/dL (ref 0.61–1.24)
GFR, Estimated: >60 mL/min (ref >60)
Glucose, Bld: 200 mg/dL — ABNORMAL HIGH (ref 70–99)
Potassium: 4.1 mmol/L (ref 3.5–5.1)
Sodium: 137 mmol/L (ref 135–145)
Total Bilirubin: 0.7 mg/dL (ref 0.0–1.2)
Total Protein: 6 g/dL — ABNORMAL LOW (ref 6.5–8.1)

## 2024-03-08 LAB — HEPARIN LEVEL (UNFRACTIONATED): Heparin Unfractionated: 0.11 [IU]/mL — ABNORMAL LOW (ref 0.30–0.70)

## 2024-03-08 LAB — MAGNESIUM: Magnesium: 1.9 mg/dL (ref 1.7–2.4)

## 2024-03-08 MED ORDER — PANTOPRAZOLE SODIUM 40 MG PO TBEC
40.0000 mg | DELAYED_RELEASE_TABLET | Freq: Every day | ORAL | Status: DC
  Administered 2024-03-08 – 2024-03-15 (×8): 40 mg via ORAL
  Filled 2024-03-08 (×8): qty 1

## 2024-03-08 MED ORDER — INSULIN GLARGINE-YFGN 100 UNIT/ML ~~LOC~~ SOLN
20.0000 [IU] | Freq: Every day | SUBCUTANEOUS | Status: DC
  Administered 2024-03-08 (×2): 20 [IU] via SUBCUTANEOUS
  Filled 2024-03-08 (×3): qty 0.2

## 2024-03-08 MED ORDER — HEPARIN (PORCINE) 25000 UT/250ML-% IV SOLN
1800.0000 [IU]/h | INTRAVENOUS | Status: AC
  Administered 2024-03-08: 1350 [IU]/h via INTRAVENOUS
  Administered 2024-03-09: 1550 [IU]/h via INTRAVENOUS
  Filled 2024-03-08 (×2): qty 250

## 2024-03-08 MED ORDER — INSULIN ASPART 100 UNIT/ML IJ SOLN
0.0000 [IU] | Freq: Three times a day (TID) | INTRAMUSCULAR | Status: DC
  Administered 2024-03-09: 3 [IU] via SUBCUTANEOUS
  Administered 2024-03-09: 9 [IU] via SUBCUTANEOUS
  Filled 2024-03-08: qty 7
  Filled 2024-03-08: qty 3

## 2024-03-08 MED ORDER — SODIUM CHLORIDE 0.9 % IV SOLN: INTRAVENOUS | Status: AC

## 2024-03-08 MED ORDER — ACETAMINOPHEN 325 MG PO TABS: 650.0000 mg | ORAL_TABLET | Freq: Four times a day (QID) | ORAL | Status: DC | PRN

## 2024-03-08 MED ORDER — EZETIMIBE 10 MG PO TABS
10.0000 mg | ORAL_TABLET | Freq: Every day | ORAL | Status: DC
  Administered 2024-03-08 – 2024-03-15 (×8): 10 mg via ORAL
  Filled 2024-03-08 (×8): qty 1

## 2024-03-08 MED ORDER — ATORVASTATIN CALCIUM 80 MG PO TABS
80.0000 mg | ORAL_TABLET | Freq: Every day | ORAL | Status: DC
  Administered 2024-03-08 – 2024-03-14 (×8): 80 mg via ORAL
  Filled 2024-03-08 (×8): qty 1

## 2024-03-08 MED ORDER — LEVETIRACETAM 500 MG PO TABS
500.0000 mg | ORAL_TABLET | Freq: Two times a day (BID) | ORAL | Status: DC
  Administered 2024-03-08 – 2024-03-15 (×16): 500 mg via ORAL
  Filled 2024-03-08 (×15): qty 1

## 2024-03-08 MED ORDER — INSULIN ASPART 100 UNIT/ML IJ SOLN
10.0000 [IU] | Freq: Once | INTRAMUSCULAR | Status: AC
  Administered 2024-03-08: 10 [IU] via SUBCUTANEOUS
  Filled 2024-03-08: qty 10

## 2024-03-08 MED ORDER — ACETAMINOPHEN 650 MG RE SUPP: 650.0000 mg | Freq: Four times a day (QID) | RECTAL | Status: DC | PRN

## 2024-03-08 MED ORDER — METOPROLOL SUCCINATE ER 25 MG PO TB24
25.0000 mg | ORAL_TABLET | Freq: Every day | ORAL | Status: DC
  Administered 2024-03-08 – 2024-03-15 (×8): 25 mg via ORAL
  Filled 2024-03-08 (×8): qty 1

## 2024-03-08 MED ORDER — DEXAMETHASONE 2 MG PO TABS
2.0000 mg | ORAL_TABLET | Freq: Every day | ORAL | Status: DC
  Administered 2024-03-08 – 2024-03-15 (×8): 2 mg via ORAL
  Filled 2024-03-08 (×8): qty 1

## 2024-03-08 MED ORDER — DOFETILIDE 500 MCG PO CAPS
500.0000 ug | ORAL_CAPSULE | Freq: Two times a day (BID) | ORAL | Status: AC
  Administered 2024-03-08 – 2024-03-15 (×16): 500 ug via ORAL
  Filled 2024-03-08 (×17): qty 1

## 2024-03-08 NOTE — TOC Initial Note (Addendum)
 Transition of Care (TOC) - Initial/Assessment Note  Douglas Gobble RN, BSN Inpatient Care Management Unit 4E- RN Case Manager See Treatment Team for direct phone #   Patient Details  Name: Douglas Hunter MRN: [id number] Date of Birth: 1955/05/05  Transition of Care [facility]) CM/SW Contact:    Douglas Douglas Hurst, RN Phone Number: 03/08/2024, 3:54 PM  Clinical Narrative:                 Epic Chat received from OT regarding questions that family had about home equipment.   CM went to room to answer questions and discuss transition needs for home.  OT also present at bedside.  Wife was asking about insurance coverage for a [employer]- explained that typically Medicare does not cover these types of lifts as they are considered convenience not DME. Also discussed coverage for Lift chairs as well.  Per pt and wife - pt received a RW and BSC about a month ago when he was discharged Daron provided). Explained that due to pt receiving RW recently -insurance will not cover wheelchair at this time- usually one or the other every 5 yrs- pt and wife voiced understanding. They state they have a friend that they can borrow one from- OT request that they see if friend can take a video or some pictures so that therapy can look at it and make sure size, etc will work for pt. Also discussed a drop arm BSC- which again would not be covered due to receiving BSC recently- family to look into purchasing privately.   Per PT/OT- recs for [facility]- pt and wife agreeable would like to start out with [facility] then change to outpt therapy when pt starts at [facility] and do therapy there as well. Choice offered for [facility]- pt and wife voice they do not have a preference on [facility] provider.  Address, phone # confirmed.   Pt will need orders for HHPT/OT prior to discharge  Referral for [facility] needs placed in Hub- will need to follow up to see if accepted by agency pending orders.  Update- 1630- [facility] has accepted  referral  Family plans to transport home.   IP CM to follow  Expected Discharge Plan: Home w Home Health Services Barriers to Discharge: Continued Medical Work up   Patient Goals and CMS Choice Patient states their goals for this hospitalization and ongoing recovery are:: return home and recover   Choice offered to / list presented to : Patient      Expected Discharge Plan and Services   Discharge Planning Services: CM Consult Post Acute Care Choice: Home Health, Durable Medical Equipment Living arrangements for the past 2 months: Single Family Home                           HH Arranged: PT, OT   Date HH Agency Contacted: 03/08/24 Time HH Agency Contacted: 1554 Representative spoke with at [facility] Agency: sent referrals in Hub  Prior Living Arrangements/Services Living arrangements for the past 2 months: Single Family Home Lives with:: Self, Spouse Patient language and need for interpreter reviewed:: Yes Do you feel safe going back to the place where you live?: Yes      Need for Family Participation in Patient Care: Yes (Comment) Care giver support system in place?: Yes (comment) Current home services: DME (rolling walker, BSC) Criminal Activity/Legal Involvement Pertinent to Current Situation/Hospitalization: No - Comment as needed  Activities of Daily Living  ADL Screening (condition at time of admission) Independently performs ADLs?: Yes (appropriate for developmental age) Is the patient deaf or have difficulty hearing?: No Does the patient have difficulty seeing, even when wearing glasses/contacts?: No Does the patient have difficulty concentrating, remembering, or making decisions?: No  Permission Sought/Granted Permission sought to share information with : Facility Industrial/product Designer granted to share information with : Yes, Verbal Permission Granted     Permission granted to share info w AGENCY: HH        Emotional Assessment Appearance::  Appears stated age Attitude/Demeanor/Rapport: Engaged Affect (typically observed): Accepting, Pleasant Orientation: : Oriented to Self, Oriented to Place, Oriented to  Time, Oriented to Situation Alcohol / Substance Use: Not Applicable Psych Involvement: No (comment)  Admission diagnosis:  Critical limb ischemia of left lower extremity (HCC) [I70.222] Above knee amputation of left lower extremity (HCC) [D21.887J] Patient Active Problem List   Diagnosis Date Noted   Above knee amputation of left lower extremity (HCC) 03/07/2024   Critical limb ischemia of left lower extremity (HCC) 03/07/2024   Anemia 03/07/2024   Chronic diastolic CHF (congestive heart failure) (HCC) 03/07/2024   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   Acute lower limb ischemia 02/09/2024   Leukocytosis 02/09/2024   Focal seizures (HCC) 02/08/2024   Status post craniotomy 01/29/2024   Brain tumor (HCC) 01/29/2024   Glioblastoma (HCC) 01/29/2024   History of colon polyps 04/20/2023   Polyp of ascending colon 04/20/2023   OSA on CPAP 06/28/2022   Secondary hypercoagulable state 04/13/2020   Rapid atrial fibrillation (HCC) 03/13/2020   Postural dizziness with presyncope 03/13/2020   AKI (acute kidney injury) 03/13/2020   Metabolic acidosis 03/13/2020   Elevated troponin 03/13/2020   Atrial fibrillation with rapid ventricular response (HCC) 03/13/2020   Unstable angina (HCC) 03/06/2020   Cervical radiculopathy 04/18/2018   Obesity, unspecified 07/19/2017   Paroxysmal atrial fibrillation (HCC)    Poorly controlled diabetes mellitus (HCC)    Special screening for malignant neoplasms, colon    Polyp of sigmoid colon    Benign neoplasm of ascending colon    Erectile dysfunction 12/10/2015   Gastroesophageal reflux disease without esophagitis 12/03/2015   Snoring 01/23/2013   Atrial flutter (HCC) 08/20/2012   Chest pain 08/19/2011   HTN (hypertension) 08/19/2011   HLD (hyperlipidemia) 08/19/2011   Type 2 diabetes  mellitus with hyperglycemia (HCC) 08/19/2011   Coronary artery disease 08/03/2011   PCP:  [name], MD Pharmacy:   [facility]  - [address] [place] [address] Phone: [phone] Fax: [phone]  [facility] [address] [place] [address] Phone: [phone] Fax: [phone]  [facility] - [facility] [address] [place] [address] Phone: [phone] Fax: [phone]     Social Drivers of Health (SDOH) Social History: SDOH Screenings   Food Insecurity: No Food Insecurity (02/12/2024)  Housing: Low Risk  (02/12/2024)  Transportation Needs: No Transportation Needs (02/12/2024)  Utilities: At Risk (02/12/2024)  Depression (PHQ2-9): Low Risk  (02/08/2024)  Financial Resource Strain: Low Risk  (01/31/2023)   Received from [facility]  Tobacco Use: Medium Risk (03/07/2024)   SDOH Interventions:     Readmission Risk Interventions    02/12/2024    2:37 PM  Readmission Risk Prevention Plan  Transportation Screening Complete  PCP or Specialist Appt within 3-5 Days Complete  HRI or Home Care Consult Complete  Social Work Consult for Recovery  Care Planning/Counseling Complete  Palliative Care Screening Not Applicable  Medication Review (RN Care Manager) Referral to Pharmacy

## 2024-03-08 NOTE — Progress Notes (Signed)
 PHARMACY - ANTICOAGULATION CONSULT NOTE  Pharmacy Consult for Heparin  (Xarelto  on hold) Indication: atrial fibrillation  No Known Allergies  Patient Measurements: Height: 5' 11 (180.3 cm) Weight: 110.2 kg (243 lb) IBW/kg (Calculated) : 75.3 HEPARIN  DW (KG): 99  Vital Signs: Temp: 99.2 F (37.3 C) (12/05 1639) Temp Source: Oral (12/05 1639) BP: 163/69 (12/05 1639) Pulse Rate: 74 (12/05 1639)  Labs: Recent Labs    03/07/24 1405 03/07/24 1441 03/07/24 2118 03/08/24 0254 03/08/24 1726  HGB 10.8*  --   --  10.1*  --   HCT 34.2*  --   --  31.5*  --   PLT 358  --   --  349  --   APTT 29  --   --   --  32  LABPROT 14.2  --   --   --   --   INR 1.0  --   --   --   --   HEPARINUNFRC  --   --   --   --  0.11*  CREATININE  --  1.19 1.19 1.19  --     Estimated Creatinine Clearance: 74 mL/min (by C-G formula based on SCr of 1.19 mg/dL).   Medical History: Past Medical History:  Diagnosis Date   AKI (acute kidney injury) 03/13/2020   Atrial flutter (HCC)    Coronary artery disease 08/2011   a. NSTEMI 08/2011, LHC w/ severe disease of LCx with good collaterals, lesion was high-risk for intervention due to a steep angulation of the LCx coming from the left main coronary artery as well as heavy calcifications, discused at cath conference w/ consensus advising med Rx; b. 03/2020 Cath: LM nl, LAD 66m, LCX 100p w/ R->L and L->L collats. RCA dominant, nl. EF 55%. PA 33/6/19, PCWP 8.   GERD (gastroesophageal reflux disease)    H/O brain tumor 01/2024   History of echocardiogram    a. 01/2020 Echo: EF 60-65%, no rwma. Sev elev PASP. Mod dil LA.   Hyperlipidemia    Hypertension    Myocardial infarction [facility])    Paroxysmal atrial fibrillation (HCC)    a. s/p ablation 01/2013; b CHADS2VASc => 3 (HTN, DM, vascular disease); c. on Coumadin ; d. 12/2019 Amio d/c'd 2/2 hyperthyroidism.   Poorly controlled diabetes mellitus (HCC)    RBBB    Sleep apnea    uses nightly   Assessment: 68  year old male s/p left AKA to begin heparin  while Xarelto  is on hold for Afib Heparin  drip 1350 uts/hr   - running without issues in R arm  Labs drawn L hand Heparin  level 0.11 and aptt 32sec  < goal No bleeding noted  Goal of Therapy:  Heparin  level 0.3-0.7 units/ml Monitor platelets by anticoagulation protocol: Yes   Plan:  Increase Heparin  drip rate 1550 units / hr Daily heparin  level, CBC, aPTT Monitor s/s bleeding    Olam Chalk Pharm.D. CPP, BCPS Clinical Pharmacist [phone] 03/08/2024 6:42 PM

## 2024-03-08 NOTE — Progress Notes (Signed)
 PHARMACY - ANTICOAGULATION CONSULT NOTE  Pharmacy Consult for Heparin  (Xarelto  on hold) Indication: atrial fibrillation  No Known Allergies  Patient Measurements: Height: 5' 11 (180.3 cm) Weight: 110.2 kg (243 lb) IBW/kg (Calculated) : 75.3 HEPARIN  DW (KG): 99  Vital Signs: Temp: 98.7 F (37.1 C) (12/05 0819) Temp Source: Oral (12/05 0819) BP: 143/72 (12/05 0819) Pulse Rate: 62 (12/05 0819)  Labs: Recent Labs    03/07/24 1405 03/07/24 1441 03/07/24 2118 03/08/24 0254  HGB 10.8*  --   --  10.1*  HCT 34.2*  --   --  31.5*  PLT 358  --   --  349  APTT 29  --   --   --   LABPROT 14.2  --   --   --   INR 1.0  --   --   --   CREATININE  --  1.19 1.19 1.19    Estimated Creatinine Clearance: 74 mL/min (by C-G formula based on SCr of 1.19 mg/dL).   Medical History: Past Medical History:  Diagnosis Date   AKI (acute kidney injury) 03/13/2020   Atrial flutter (HCC)    Coronary artery disease 08/2011   a. NSTEMI 08/2011, LHC w/ severe disease of LCx with good collaterals, lesion was high-risk for intervention due to a steep angulation of the LCx coming from the left main coronary artery as well as heavy calcifications, discused at cath conference w/ consensus advising med Rx; b. 03/2020 Cath: LM nl, LAD 30m, LCX 100p w/ R->L and L->L collats. RCA dominant, nl. EF 55%. PA 33/6/19, PCWP 8.   GERD (gastroesophageal reflux disease)    H/O brain tumor 01/2024   History of echocardiogram    a. 01/2020 Echo: EF 60-65%, no rwma. Sev elev PASP. Mod dil LA.   Hyperlipidemia    Hypertension    Myocardial infarction [facility])    Paroxysmal atrial fibrillation (HCC)    a. s/p ablation 01/2013; b CHADS2VASc => 3 (HTN, DM, vascular disease); c. on Coumadin ; d. 12/2019 Amio d/c'd 2/2 hyperthyroidism.   Poorly controlled diabetes mellitus (HCC)    RBBB    Sleep apnea    uses nightly   Assessment: 68 year old male s/p left AKA to begin heparin  while Xarelto  is on hold for Afib  Goal of  Therapy:  Heparin  level 0.3-0.7 units/ml Monitor platelets by anticoagulation protocol: Yes   Plan:  Heparin  drip at 1350 units / hr Heparin  level and aPTT in 8 hours Daily heparin  level, CBC, aPTT  Thank you. Olam Monte, PharmD  03/08/2024,8:38 AM

## 2024-03-08 NOTE — Evaluation (Signed)
 Occupational Therapy Evaluation Patient Details Name: Douglas Hunter MRN: [id number] DOB: [birth year] Today's Date: 03/08/2024   History of Present Illness   68 yo male presenting with L foot pain. 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 PTA pt was  receiving assistance from family with LB ADLs. Today pt is requiring set up  to mod assist for ADLs. STS with RW was mod assist. Educated pt and spouse on use of squat pivot  and lateral scoot transfer techniques. Pt CGA for lateral scoots along EOB. Emailed pt's wife HEP for squat pivot transfers and fall recovery strategies, based on family's concerns. Verbally educated pt and wife on need for drop arm bariatric BSC use for lateral scoots, as well as purchasing information for it. Recommending follow up with  HHOT. OT will continue to follow acutely to maximize functional independence.     If plan is discharge home, recommend the following:   A lot of help with walking and/or transfers;A lot of help with bathing/dressing/bathroom;Assist for transportation     Functional Status Assessment   Patient has had a recent decline in their functional status and demonstrates the ability to make significant improvements in function in a reasonable and predictable amount of time.     Equipment Recommendations   Other (comment) (drop arm bariatric BSC)      Precautions/Restrictions   Precautions Precautions: Fall Recall of Precautions/Restrictions: Intact Restrictions Weight Bearing Restrictions Per Provider Order: Yes LLE Weight Bearing Per Provider Order: Non weight bearing Other Position/Activity Restrictions: AKA     Mobility Bed Mobility Overal bed mobility: Needs Assistance Bed Mobility: Supine to Sit, Sit to  Supine     Supine to sit: Min assist, HOB elevated, Used rails Sit to supine: Supervision   General bed mobility comments: Min HH assist for trunk. Supervision to return to supine    Transfers Overall transfer level: Needs assistance Equipment used: Rolling walker (2 wheels) Transfers: Sit to/from Stand, Bed to chair/wheelchair/BSC Sit to Stand: Mod assist, From elevated surface          Lateral/Scoot Transfers: Contact guard assist General transfer comment: Mod assist for STS with RW. Cues for sequencing and hand placement. Lateral scoots conducted along EOB CGA. Cues for hand placement. Verbally educated pt and wife on postioning for squat pivots.      Balance Overall balance assessment: Needs assistance Sitting-balance support: No upper extremity supported, Feet supported Sitting balance-[name] Scale: Fair     Standing balance support: Bilateral upper extremity supported, Reliant on assistive device for balance, During functional activity Standing balance-[name] Scale: Poor Standing balance comment: Reliant on RW       ADL either performed or assessed with clinical judgement   ADL Overall ADL's : Needs assistance/impaired Eating/Feeding: Set up;Sitting   Grooming: Set up;Sitting   Upper Body Bathing: Set up;Sitting   Lower Body Bathing: Moderate assistance;Sitting/lateral leans;Sit to/from stand   Upper Body Dressing : Set up;Sitting   Lower Body Dressing: Moderate assistance;Sitting/lateral leans;Sit to/from stand   Toilet Transfer: Stand-pivot;Moderate assistance;Rolling walker (2 wheels)           Functional mobility during ADLs: Moderate assistance;Rolling walker (2 wheels) General ADL Comments: Educated pt and wife on use of drop arm bariatric BSC for lateral scoots and other  compensatory techniques     Vision Baseline Vision/History: 1 Wears glasses Patient Visual Report: No change from baseline Vision Assessment?: No apparent visual deficits             Pertinent Vitals/Pain Pain Assessment Pain Assessment: Faces Faces Pain Scale: Hurts even more Pain Location: LLE Pain Descriptors / Indicators: Discomfort Pain Intervention(s): Limited activity within patient's tolerance     Extremity/Trunk Assessment Upper Extremity Assessment Upper Extremity Assessment: Overall WFL for tasks assessed   Lower Extremity Assessment Lower Extremity Assessment: Defer to PT evaluation   Cervical / Trunk Assessment Cervical / Trunk Assessment: Normal   Communication Communication Communication: Impaired Factors Affecting Communication: Difficulty expressing self   Cognition Arousal: Alert Behavior During Therapy: WFL for tasks assessed/performed, Impulsive Cognition: Cognition impaired     Awareness: Online awareness impaired     Executive functioning impairment (select all impairments): Organization, Problem solving, Reasoning OT - Cognition Comments: Pt mildly impulsive, but easily re-directable. Likely baseline cog d/t hx of brain cancer. Difficulty expressing self, receiving OP SLP       Following commands: Intact       Cueing  General Comments   Cueing Techniques: Verbal cues  Access Code: [email or web address]  URL: [email or web address]  Date: 03/08/2024  Prepared by: Adrianne Savers    Patient Education  - Doctor, Hospital with Wheelchair and Commode  - Fall Recovery with Minimum Assist           Home Living Family/patient expects to be discharged to:: Private residence Living Arrangements: Spouse/significant other;Children Available Help at Discharge: Family;Available 24 hours/day Type of Home: House Home Access: Stairs to enter;Ramped entrance (Ramp is in progress) Entrance Stairs-Number of Steps: 2 Entrance Stairs-Rails: Right;Left Home Layout: One level     Bathroom Shower/Tub: Producer, Television/film/video: Handicapped height Bathroom Accessibility: Yes   Home Equipment: Shower seat -  built in;Cane - single point;Crutches;Standard Building Surveyor (2 wheels);BSC/3in1          Prior Functioning/Environment Prior Level of Function : Needs assist             Mobility Comments: prior to hospitalization pt was requiring some assistance with RW due to pain in the LLE ADLs Comments: Assist from family with LB ADLs    OT Problem List: Decreased strength;Decreased range of motion;Decreased activity tolerance;Impaired balance (sitting and/or standing);Decreased safety awareness;Decreased knowledge of use of DME or AE;Cardiopulmonary status limiting activity   OT Treatment/Interventions: Self-care/ADL training;Therapeutic exercise;Energy conservation;DME and/or AE instruction;Therapeutic activities;Patient/family education;Balance training      OT Goals(Current goals can be found in the care plan section)   Acute Rehab OT Goals Patient Stated Goal: To get a prosthetic OT Goal Formulation: With patient Time For Goal Achievement: 03/22/24 Potential to Achieve Goals: Good   OT Frequency:  Min 2X/week       AM-PAC OT 6 Clicks Daily Activity     Outcome Measure Help from another person eating meals?: None Help from another person taking care of personal grooming?: A Little Help from another person toileting, which includes using toliet, bedpan, or urinal?: Total Help from another person bathing (including washing, rinsing, drying)?: A Lot Help from another person to put on and taking off regular upper body clothing?: A Little Help from another person to put on and taking off regular lower body clothing?: A Lot 6 Click Score: 15   End of Session Equipment Utilized During Treatment: Gait belt;Rolling walker (2 wheels) Nurse Communication: Mobility status  Activity Tolerance: Patient tolerated  treatment well Patient left: in bed;with call bell/phone within reach;with bed alarm set;with family/visitor present  OT Visit Diagnosis: Unsteadiness on feet  (R26.81);Other abnormalities of gait and mobility (R26.89);Muscle weakness (generalized) (M62.81)                Time: 8483-8396 OT Time Calculation (min): 47 min Charges:  OT General Charges $OT Visit: 1 Visit OT Evaluation $OT Eval Moderate Complexity: 1 Mod OT Treatments $Self Care/Home Management : 23-37 mins  Adrianne BROCKS, OT  Acute Rehabilitation Services Office [phone] Secure chat preferred   Adrianne GORMAN Savers 03/08/2024, 4:44 PM

## 2024-03-08 NOTE — Progress Notes (Signed)
 Inpatient Rehab Admissions Coordinator:   Consult received and chart reviewed.  Note therapy recs currently for [facility].  Will follow in case needs change.   Reche Lowers, PT, DPT Admissions Coordinator [phone] 03/08/24 3:50 PM

## 2024-03-08 NOTE — Progress Notes (Addendum)
 Progress Note    03/08/2024 7:49 AM 1 Day Post-Op  Subjective: No complaints    Vitals:   03/08/24 0000 03/08/24 0108  BP: 137/65 (!) 141/68  Pulse: (!) 57 (!) 59  Resp: 18 20  Temp:  98 F (36.7 C)  SpO2: 98% 97%    Physical Exam: General: Resting in bed, no acute distress Lungs: Nonlabored Incisions: Left AKA incision with incisional VAC with no output.  Stump appears healthy without hematoma  CBC    Component Value Date/Time   WBC 13.7 (H) 03/08/2024 0254   RBC 3.28 (L) 03/08/2024 0254   HGB 10.1 (L) 03/08/2024 0254   HGB 13.8 06/28/2023 0946   HCT 31.5 (L) 03/08/2024 0254   HCT 41.7 06/28/2023 0946   PLT 349 03/08/2024 0254   PLT 232 06/28/2023 0946   MCV 96.0 03/08/2024 0254   MCV 93 06/28/2023 0946   MCV 95 03/02/2014 1416   MCH 30.8 03/08/2024 0254   MCHC 32.1 03/08/2024 0254   RDW 15.2 03/08/2024 0254   RDW 12.3 06/28/2023 0946   RDW 12.8 03/02/2014 1416   LYMPHSABS 0.9 02/09/2024 1347   LYMPHSABS 2.0 10/26/2016 1423   LYMPHSABS 1.5 03/02/2014 1416   MONOABS 0.6 02/09/2024 1347   MONOABS 1.3 (H) 03/02/2014 1416   EOSABS 0.0 02/09/2024 1347   EOSABS 0.3 10/26/2016 1423   EOSABS 0.3 03/02/2014 1416   BASOSABS 0.0 02/09/2024 1347   BASOSABS 0.0 10/26/2016 1423   BASOSABS 0.1 03/02/2014 1416    BMET    Component Value Date/Time   NA 137 03/08/2024 0254   NA 140 06/28/2023 0946   NA 131 (L) 03/02/2014 1416   K 4.1 03/08/2024 0254   K 4.1 03/02/2014 1416   CL 102 03/08/2024 0254   CL 98 03/02/2014 1416   CO2 26 03/08/2024 0254   CO2 24 03/02/2014 1416   GLUCOSE 200 (H) 03/08/2024 0254   GLUCOSE 374 (H) 03/02/2014 1416   BUN 23 03/08/2024 0254   BUN 26 06/28/2023 0946   BUN 18 03/02/2014 1416   CREATININE 1.19 03/08/2024 0254   CREATININE 1.27 03/02/2014 1416   CALCIUM  8.3 (L) 03/08/2024 0254   CALCIUM  8.7 03/02/2014 1416   GFRNONAA >60 03/08/2024 0254   GFRNONAA >60 03/02/2014 1416   GFRNONAA >60 01/12/2013 0013   GFRAA 53 (L)  02/20/2020 0925   GFRAA >60 03/02/2014 1416   GFRAA >60 01/12/2013 0013    INR    Component Value Date/Time   INR 1.0 03/07/2024 1405   INR 1.1 01/11/2013 1615     Intake/Output Summary (Last 24 hours) at 03/08/2024 0749 Last data filed at 03/07/2024 1819 Gross per 24 hour  Intake 850 ml  Output 150 ml  Net 700 ml      Assessment/Plan:  68 y.o. male is 1 day postop, s/p: Left AKA  -He is doing well this morning and says that his pain is well-controlled on current medications -Hemoglobin is stable at 10.1 without further signs of blood loss -Left AKA stump is well-appearing.  Incisional VAC is in place with no output currently -Will leave VAC on for 1 week or at least until the patient is ready for discharge -Okay to start heparin  drip for atrial fibrillation -Okay to mobilize as tolerated with PT/OT   Ahmed Holster, PA-C [facility] [phone] 03/08/2024 7:49 AM  VASCULAR STAFF ADDENDUM: I have independently interviewed and examined the patient. I agree with the above.  Looks great. Mobilize as able.  Will check in Monday. Please call over weekend for any acute issues.  Debby SAILOR. Magda, MD [facility] Phone Number: [phone] 03/08/2024 12:20 PM

## 2024-03-08 NOTE — Progress Notes (Signed)
 PROGRESS NOTE    Douglas Hunter  FMW:[id number] DOB: 1955-10-17 DOA: 03/07/2024 PCP: [name], MD  Subjective: Patient reports feeling okay, has soreness at the stump, but pain is overall controlled.   Hospital Course: 68 y.o. male with medical history significant of A-fib on Xarelto , temporal glioblastoma, peripheral vascular disease now status post left AKA, diabetes type 2, HLD who was admitted after left AKA done by vascular on 03/07/24. Patient with known history of left lower extremity critical ischemia in November, undergone left femoral friable and enterectomy with bovine pericardial patch angioplasty with left femoral-popliteal arterial thrombectomy, left posterior tibial arterial thrombectomy, left lower extremity calf 4 compartment fasciotomy but unfortunately he continued to have severe left lower extremity pain and the decision was made to perform left AKA when no other revascularization options were available.    Assessment and Plan:  Left limb ischemia, s/p L AKA on 12/4 Appreciate vascular surgery consult and help in management Continue pain control Completed Ancef  Wound VAC in place patient will likely need placement for rehab   Atrial flutter/Afib - continue Toprol  and Tikosyn  - continue to hold xarelto  - heparin  drip was started today, once H/H remains stable and okay with vascular surgery, can resume xarelto     Temporal glioblastoma Followed by oncology and radiation oncology  Continue Keppra  and Decadron  Po chemo on hold until AKA heals   HTN (hypertension) Resume Toprol  hold benazepril  given elevated potassium and slight bump in creatinine   OSA on CPAP Does not want to use CPAP in the hospital   Leukocytosis In the setting of steroid use and recent surgery, continue to monitor    Type 2 diabetes mellitus with hyperglycemia - continue current insulin  regimen    Anemia Transfuse for Hg <7 , rapidly dropping or  if symptomatic - H/H stable  thus far   Coronary artery disease On xarelto  so no aspirin  On lipitor  and toprol    HLD (hyperlipidemia) Continue lipitor  80 mg po q day   Obesity, unspecified Contributing to comorbidity and complicating medical management  Body mass index is 33.89 kg/m. Nutritional follow up as an out pt would be recommended   Chronic diastolic CHF - currently appears to be slightly on the dry side, hold home diuretics for now and restart when appears euvolemic, carefuly follow fluid status and Cr    DVT prophylaxis: SCDs Start: 03/08/24 0006 IV Heparin    Code Status: Full Code Family Communication: Spouse at bedside Disposition Plan: SNF Reason for continuing need for hospitalization: Not med ready   Objective: Vitals:   03/08/24 0000 03/08/24 0108 03/08/24 0819 03/08/24 1355  BP: 137/65 (!) 141/68 (!) 143/72 (!) 145/70  Pulse: (!) 57 (!) 59 62 83  Resp: 18 20 (!) 22 (!) 23  Temp:  98 F (36.7 C) 98.7 F (37.1 C) 97.9 F (36.6 C)  TempSrc:  Oral Oral Oral  SpO2: 98% 97% 96% 100%  Weight:      Height:        Intake/Output Summary (Last 24 hours) at 03/08/2024 1442 Last data filed at 03/07/2024 1819 Gross per 24 hour  Intake 850 ml  Output 150 ml  Net 700 ml   Filed Weights   03/07/24 1320  Weight: 110.2 kg    Examination:  Physical Exam Vitals and nursing note reviewed.  Constitutional:      General: He is not in acute distress. Cardiovascular:     Rate and Rhythm: Normal rate.  Pulmonary:  Effort: No respiratory distress.  Abdominal:     General: There is no distension.     Tenderness: There is no abdominal tenderness.  Musculoskeletal:     Comments: Left AKA, dressing in place     Data Reviewed: I have personally reviewed following labs and imaging studies  CBC: Recent Labs  Lab 03/07/24 1405 03/08/24 0254  WBC 14.9* 13.7*  HGB 10.8* 10.1*  HCT 34.2* 31.5*  MCV 97.4 96.0  PLT 358 349   Basic Metabolic Panel: Recent Labs  Lab 03/07/24 1441  03/07/24 2118 03/08/24 0254  NA 135 138 137  K 5.1 4.5 4.1  CL 104 105 102  CO2 22 24 26   GLUCOSE 312* 219* 200*  BUN 25* 24* 23  CREATININE 1.19 1.19 1.19  CALCIUM  8.2* 8.5* 8.3*  MG  --   --  1.9  PHOS  --   --  3.6   GFR: Estimated Creatinine Clearance: 74 mL/min (by C-G formula based on SCr of 1.19 mg/dL). Liver Function Tests: Recent Labs  Lab 03/07/24 1441 03/08/24 0254  AST 24 19  ALT 18 18  ALKPHOS 110 90  BILITOT 1.6* 0.7  PROT 6.2* 6.0*  ALBUMIN  2.4* 2.2*   No results for input(s): LIPASE, AMYLASE in the last 168 hours. No results for input(s): AMMONIA in the last 168 hours. Coagulation Profile: Recent Labs  Lab 03/07/24 1405  INR 1.0   Cardiac Enzymes: No results for input(s): CKTOTAL, CKMB, CKMBINDEX, TROPONINI in the last 168 hours. ProBNP, BNP (last 5 results) No results for input(s): PROBNP, BNP in the last 8760 hours. HbA1C: No results for input(s): HGBA1C in the last 72 hours. CBG: Recent Labs  Lab 03/07/24 2113 03/08/24 0028 03/08/24 0443 03/08/24 0642 03/08/24 0817  GLUCAP 191* 239* 182* 172* 150*   Lipid Profile: No results for input(s): CHOL, HDL, LDLCALC, TRIG, CHOLHDL, LDLDIRECT in the last 72 hours. Thyroid  Function Tests: No results for input(s): TSH, T4TOTAL, FREET4, T3FREE, THYROIDAB in the last 72 hours. Anemia Panel: No results for input(s): VITAMINB12, FOLATE, FERRITIN, TIBC, IRON, RETICCTPCT in the last 72 hours. Sepsis Labs: No results for input(s): PROCALCITON, LATICACIDVEN in the last 168 hours.  No results found for this or any previous visit (from the past 240 hours).   Radiology Studies: No results found.  Scheduled Meds:  atorvastatin   80 mg Oral QHS   dexamethasone   2 mg Oral Daily   dofetilide   500 mcg Oral BID   ezetimibe   10 mg Oral Daily   insulin  aspart  0-9 Units Subcutaneous Q4H   insulin  glargine-yfgn  10 Units Subcutaneous Daily   insulin   glargine-yfgn  20 Units Subcutaneous QHS   levETIRAcetam   500 mg Oral BID   metoprolol  succinate  25 mg Oral Daily   pantoprazole   40 mg Oral Daily   Continuous Infusions:  heparin  1,350 Units/hr (03/08/24 1045)     LOS: 1 day   Time spent: 40 minutes  Casimer Dare, MD  [facility]  03/08/2024, 2:42 PM

## 2024-03-08 NOTE — Evaluation (Signed)
 Physical Therapy Evaluation Patient Details Name: Douglas Hunter MRN: [id number] DOB: Oct 16, 1955 Today's Date: 03/08/2024  History of Present Illness  68 yo male presenting with L foot pain. 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 Heavy multi modal cues for body mechanics, safe hand placement and sequencing. Spouse present and supportive during session. Pt has family that will be at home to assist. Due to pt current functional status, home set up and available assistance at home recommending skilled physical therapy services 3x/week in order to address strength, balance and functional mobility to decrease risk for falls, injury and re-hospitalization.           If plan is discharge home, recommend the following: A little help with walking and/or transfers;Help with stairs or ramp for entrance;Assist for transportation;Assistance with Academic Librarian cushion (measurements PT);Wheelchair (measurements PT) (20 inches)     Functional Status Assessment Patient has had a recent decline in their functional status and demonstrates the ability to make significant improvements in function in a reasonable and predictable amount of time.     Precautions / Restrictions Precautions Precautions: Fall Recall of Precautions/Restrictions: Intact Restrictions Weight Bearing Restrictions Per Provider Order: Yes LLE Weight Bearing Per Provider Order: Non weight bearing Other Position/Activity Restrictions: AKA      Mobility  Bed Mobility Overal bed mobility: Needs Assistance Bed Mobility: Supine to Sit     Supine to sit: HOB elevated, Used rails,  Supervision     General bed mobility comments: Supervision with use of bed rail and HOB elevated ~ 40 degrees, spoke with spouse about possible bed rail at home or assist with trunk to midline    Transfers Overall transfer level: Needs assistance Equipment used: Rolling walker (2 wheels) Transfers: Sit to/from Stand, Bed to chair/wheelchair/BSC Sit to Stand: Mod assist, From elevated surface   Step pivot transfers: Mod assist       General transfer comment: Mod A from EOB and recliner with muldi modal cues for body mechanics and hand placement. Step pivot with short uneven hop steps from EOB to recliner with low floor clearance    Ambulation/Gait   Pre-gait activities: Worked on clearing RLE from the floor in order to work on floor clearance with low floor clearance adequate for transfers       Balance Overall balance assessment: Needs assistance Sitting-balance support: Single extremity supported, Feet supported Sitting balance-[name] Scale: Fair     Standing balance support: Bilateral upper extremity supported, Reliant on assistive device for balance, During functional activity Standing balance-[name] Scale: Poor Standing balance comment: Heavy UE support on RW with Mod A for balance/LE weakness       Pertinent Vitals/Pain Pain Assessment Pain Assessment: No/denies pain    Home Living Family/patient expects to be discharged to:: Private residence Living Arrangements: Spouse/significant other;Children Available Help at Discharge: Family;Available 24 hours/day Type of Home: House Home Access: Stairs to enter;Other (comment) (getting ramp put in on Sunday) Entrance Stairs-Rails: Right;Left Entrance Stairs-Number of Steps: 2   Home Layout: One level Home Equipment: Shower seat - built in;Cane - single point;Crutches;Standard Building Surveyor (2 wheels)      Prior  Function Prior Level of Function : Needs assist       Physical Assist : Mobility  (physical) Mobility (physical): Transfers;Gait   Mobility Comments: prior to hospitalization pt was requiring some assistance with RW due to pain in the LLE       Extremity/Trunk Assessment   Upper Extremity Assessment Upper Extremity Assessment: Overall WFL for tasks assessed    Lower Extremity Assessment Lower Extremity Assessment: LLE deficits/detail;Generalized weakness LLE Deficits / Details: AKA    Cervical / Trunk Assessment Cervical / Trunk Assessment: Normal  Communication   Communication Communication: Impaired Factors Affecting Communication: Difficulty expressing self    Cognition Arousal: Alert Behavior During Therapy: WFL for tasks assessed/performed   PT - Cognitive impairments: No apparent impairments   Following commands: Intact       Cueing Cueing Techniques: Verbal cues     General Comments General comments (skin integrity, edema, etc.): Wound vac in place, no drainage noted. Vitals stable during session        Assessment/Plan    PT Assessment Patient needs continued PT services  PT Problem List Decreased strength;Decreased balance;Decreased activity tolerance;Decreased mobility;Decreased safety awareness       PT Treatment Interventions DME instruction;Balance training;Gait training;Functional mobility training;Patient/family education;Therapeutic activities;Therapeutic exercise    PT Goals (Current goals can be found in the Care Plan section)  Acute Rehab PT Goals Patient Stated Goal: to return home safety with family and start radiation PT Goal Formulation: With patient/family Time For Goal Achievement: 03/22/24 Potential to Achieve Goals: Good Additional Goals Additional Goal #1: Pt will navigate W/C with Min A assist for W/C management    Frequency Min 3X/week        AM-PAC PT 6 Clicks Mobility  Outcome Measure Help needed turning from your back to your side while in a flat bed without using bedrails?: A Little Help needed  moving from lying on your back to sitting on the side of a flat bed without using bedrails?: A Little Help needed moving to and from a bed to a chair (including a wheelchair)?: A Lot Help needed standing up from a chair using your arms (e.g., wheelchair or bedside chair)?: A Lot Help needed to walk in hospital room?: Total Help needed climbing 3-5 steps with a railing? : Total 6 Click Score: 12    End of Session Equipment Utilized During Treatment: Gait belt Activity Tolerance: Patient tolerated treatment well Patient left: in chair;with call bell/phone within reach;with family/visitor present Nurse Communication: Mobility status PT Visit Diagnosis: Unsteadiness on feet (R26.81);Other abnormalities of gait and mobility (R26.89);Muscle weakness (generalized) (M62.81)    Time: 8870-8795 PT Time Calculation (min) (ACUTE ONLY): 35 min   Charges:   PT Evaluation $PT Eval Low Complexity: 1 Low PT Treatments $Therapeutic Activity: 8-22 mins PT General Charges $$ ACUTE PT VISIT: 1 Visit         Dorothyann Maier, DPT, CLT  Acute Rehabilitation Services Office: [phone] (Secure chat preferred)   Dorothyann VEAR Maier 03/08/2024, 12:20 PM

## 2024-03-09 DIAGNOSIS — I998 Other disorder of circulatory system: Secondary | ICD-10-CM | POA: Diagnosis not present

## 2024-03-09 DIAGNOSIS — S78112A Complete traumatic amputation at level between left hip and knee, initial encounter: Secondary | ICD-10-CM | POA: Diagnosis not present

## 2024-03-09 DIAGNOSIS — G4733 Obstructive sleep apnea (adult) (pediatric): Secondary | ICD-10-CM | POA: Diagnosis not present

## 2024-03-09 DIAGNOSIS — D72829 Elevated white blood cell count, unspecified: Secondary | ICD-10-CM | POA: Diagnosis not present

## 2024-03-09 LAB — CBC
HCT: 30.6 % — ABNORMAL LOW (ref 39.0–52.0)
Hemoglobin: 10.1 g/dL — ABNORMAL LOW (ref 13.0–17.0)
MCH: 31.1 pg (ref 26.0–34.0)
MCHC: 33 g/dL (ref 30.0–36.0)
MCV: 94.2 fL (ref 80.0–100.0)
Platelets: 364 K/uL (ref 150–400)
RBC: 3.25 MIL/uL — ABNORMAL LOW (ref 4.22–5.81)
RDW: 14.7 % (ref 11.5–15.5)
WBC: 14.6 K/uL — ABNORMAL HIGH (ref 4.0–10.5)
nRBC: 0 % (ref 0.0–0.2)

## 2024-03-09 LAB — BASIC METABOLIC PANEL WITH GFR
Anion gap: 9 (ref 5–15)
BUN: 22 mg/dL (ref 8–23)
CO2: 22 mmol/L (ref 22–32)
Calcium: 8.4 mg/dL — ABNORMAL LOW (ref 8.9–10.3)
Chloride: 101 mmol/L (ref 98–111)
Creatinine, Ser: 1.15 mg/dL (ref 0.61–1.24)
GFR, Estimated: >60 mL/min (ref >60)
Glucose, Bld: 270 mg/dL — ABNORMAL HIGH (ref 70–99)
Potassium: 4.2 mmol/L (ref 3.5–5.1)
Sodium: 132 mmol/L — ABNORMAL LOW (ref 135–145)

## 2024-03-09 LAB — GLUCOSE, CAPILLARY
Glucose-Capillary: 224 mg/dL — ABNORMAL HIGH (ref 70–99)
Glucose-Capillary: 261 mg/dL — ABNORMAL HIGH (ref 70–99)
Glucose-Capillary: 357 mg/dL — ABNORMAL HIGH (ref 70–99)
Glucose-Capillary: 322 mg/dL — ABNORMAL HIGH (ref 70–99)
Glucose-Capillary: 371 mg/dL — ABNORMAL HIGH (ref 70–99)

## 2024-03-09 LAB — HEPARIN LEVEL (UNFRACTIONATED)
Heparin Unfractionated: 0.24 [IU]/mL — ABNORMAL LOW (ref 0.30–0.70)
Heparin Unfractionated: 0.56 [IU]/mL (ref 0.30–0.70)

## 2024-03-09 LAB — APTT: aPTT: 53 s — ABNORMAL HIGH (ref 24–36)

## 2024-03-09 MED ORDER — RIVAROXABAN 20 MG PO TABS
20.0000 mg | ORAL_TABLET | Freq: Every day | ORAL | Status: DC
  Administered 2024-03-09 – 2024-03-15 (×7): 20 mg via ORAL
  Filled 2024-03-09 (×7): qty 1

## 2024-03-09 MED ORDER — INSULIN ASPART 100 UNIT/ML IJ SOLN
0.0000 [IU] | Freq: Every day | INTRAMUSCULAR | Status: DC
  Administered 2024-03-09: 5 [IU] via SUBCUTANEOUS
  Administered 2024-03-10: 3 [IU] via SUBCUTANEOUS
  Administered 2024-03-11 – 2024-03-14 (×4): 2 [IU] via SUBCUTANEOUS
  Filled 2024-03-09: qty 2
  Filled 2024-03-09: qty 3
  Filled 2024-03-09 (×2): qty 2
  Filled 2024-03-09: qty 4
  Filled 2024-03-09: qty 2

## 2024-03-09 MED ORDER — BENAZEPRIL HCL 5 MG PO TABS
20.0000 mg | ORAL_TABLET | Freq: Every day | ORAL | Status: DC
  Administered 2024-03-09 – 2024-03-15 (×7): 20 mg via ORAL
  Filled 2024-03-09 (×6): qty 4

## 2024-03-09 MED ORDER — INSULIN GLARGINE 100 UNIT/ML ~~LOC~~ SOLN
20.0000 [IU] | Freq: Two times a day (BID) | SUBCUTANEOUS | Status: DC
  Administered 2024-03-09 (×2): 20 [IU] via SUBCUTANEOUS
  Filled 2024-03-09 (×4): qty 0.2

## 2024-03-09 MED ORDER — INSULIN ASPART 100 UNIT/ML IJ SOLN
3.0000 [IU] | Freq: Three times a day (TID) | INTRAMUSCULAR | Status: DC
  Administered 2024-03-09 (×2): 3 [IU] via SUBCUTANEOUS
  Filled 2024-03-09 (×2): qty 3

## 2024-03-09 MED ORDER — INSULIN ASPART 100 UNIT/ML IJ SOLN
0.0000 [IU] | Freq: Three times a day (TID) | INTRAMUSCULAR | Status: DC
  Administered 2024-03-09: 15 [IU] via SUBCUTANEOUS
  Administered 2024-03-10: 11 [IU] via SUBCUTANEOUS
  Administered 2024-03-10: 20 [IU] via SUBCUTANEOUS
  Administered 2024-03-11: 7 [IU] via SUBCUTANEOUS
  Administered 2024-03-11 (×2): 11 [IU] via SUBCUTANEOUS
  Administered 2024-03-12 (×2): 7 [IU] via SUBCUTANEOUS
  Administered 2024-03-12: 15 [IU] via SUBCUTANEOUS
  Administered 2024-03-13: 3 [IU] via SUBCUTANEOUS
  Administered 2024-03-13: 4 [IU] via SUBCUTANEOUS
  Administered 2024-03-14: 7 [IU] via SUBCUTANEOUS
  Administered 2024-03-14: 4 [IU] via SUBCUTANEOUS
  Administered 2024-03-14 – 2024-03-15 (×3): 11 [IU] via SUBCUTANEOUS
  Filled 2024-03-09 (×2): qty 7
  Filled 2024-03-09: qty 1
  Filled 2024-03-09: qty 11
  Filled 2024-03-09: qty 1
  Filled 2024-03-09: qty 7
  Filled 2024-03-09: qty 4
  Filled 2024-03-09: qty 20
  Filled 2024-03-09 (×6): qty 11
  Filled 2024-03-09: qty 1
  Filled 2024-03-09: qty 15

## 2024-03-09 NOTE — Progress Notes (Signed)
 PROGRESS NOTE  Douglas Hunter FMW:[id number] DOB: [birth year]   PCP: [name], MD  Patient is from: Home.  Lives with his wife.  DOA: 03/07/2024 LOS: 2  Chief complaints No chief complaint on file.    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  No major postoperative complications.  Therapy recommended CIR.    Subjective: Seen and examined earlier this morning.  No major events overnight or this morning.  No major events overnight of this morning.  Patient's wife concerned about taking patient home after he went down when he tried to go to the bathroom yesterday afternoon.  No trauma, LOC or apparent injury.   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 -Fall precaution   IDDM-2 with hyperglycemia: A1c 8.0% on 10/28.  Hyperglycemia likely due to steroid and undertreatment.  Uses Lantus  25 units twice daily and Humalog  16 units 3 times daily at home.  He is also on Jardiance  at home. Recent Labs  Lab 03/08/24 1635 03/08/24 2118 03/09/24 0618 03/09/24 0825 03/09/24 1207  GLUCAP 381* 480* 224* 261* 357*  - Increase basal from 20-10 to 20 units twice daily -Increase SSI from sensitive to resistant scale -Add NovoLog  3 units 3 times daily with meals   Atrial flutter/Afib: Rate controlled. -Continue Toprol  and Tikosyn  -Change heparin  to Xarelto . - Optimize electrolytes   Temporal glioblastoma: Followed by oncology and radiation oncology. -Continue Keppra  and Decadron  -Chemo on hold until AKI heals.  Coronary artery disease: Stable.  No chest pain. -Continue Lipitor ,  Toprol -XL, benazepril  and Xarelto    Essential hypertension: BP slightly elevated -Continue Toprol  XL -Restart home benazepril  at half dose.   OSA on CPAP -Refusing CPAP in the hospital.   Leukocytosis: Mild.  Likely due to steroid and stress from surgery.  Normocytic anemia: Stable after initial drop -Continue monitoring   Hyperlipidemia -Continue lipitor  80 mg po q day  Fall: Wife, patient went down on the floor when he tried to go to the bathroom the afternoon of 12/5.  No prodromes.  No apparent injury or focal deficit. -Fall precaution -PT/OT as above   Class I obesity Body mass index is 33.89 kg/m.          DVT prophylaxis:  SCDs Start: 03/08/24 0006 rivaroxaban  (XARELTO ) tablet 20 mg  Code Status: Full code Family Communication: Updated patient's wife at bedside Level of care: Telemetry Status is: Inpatient Remains inpatient appropriate because: Left AKA   Final disposition: CIR   55 minutes with more than 50% spent in reviewing records, counseling patient/family and coordinating care.  Consultants:  Vascular surgery  Procedures: 12/4-left AKA   Microbiology summarized: None  Objective: Vitals:   03/08/24 2050 03/08/24 2310 03/09/24 0621 03/09/24 0803  BP: (!) 141/72 (!) 147/79 (!) 148/65 (!) 129/59  Pulse:   63 68  Resp: 16 20 18  (!) 23  Temp: 98.2 F (36.8 C)  98.9 F (37.2 C) 98.5 F (36.9 C)  TempSrc: Oral  Oral Oral  SpO2: 99% 100% 95% 96%  Weight:      Height:        Examination:  GENERAL: No apparent distress.  Nontoxic. HEENT: MMM.  Vision and hearing grossly intact.  NECK: Supple.  No apparent JVD.  RESP:  No IWOB.  Fair aeration bilaterally. CVS:  RRR. Heart sounds normal.  ABD/GI/GU: BS+. Abd soft, NTND.  MSK/EXT:  Moves extremities.  Left AKA.  Wound VAC in place.  Small serosanguineous output. SKIN: no apparent skin lesion or wound NEURO: AA.  Oriented appropriately.  No apparent focal neuro deficit. PSYCH: Calm.  Normal affect.   Sch Meds:  Scheduled Meds:  atorvastatin   80 mg Oral QHS   dexamethasone   2 mg Oral Daily   dofetilide   500 mcg Oral BID   ezetimibe   10 mg Oral Daily   insulin  aspart  0-9 Units Subcutaneous TID WC   insulin  aspart  3 Units Subcutaneous TID WC   insulin  glargine  20 Units Subcutaneous BID   levETIRAcetam   500 mg Oral BID   metoprolol  succinate  25 mg Oral Daily   pantoprazole   40 mg Oral Daily   rivaroxaban   20 mg Oral Q supper   Continuous Infusions:  heparin  1,800 Units/hr (03/09/24 0555)   PRN Meds:.acetaminophen  **OR** acetaminophen , HYDROmorphone  (DILAUDID ) injection, oxyCODONE -acetaminophen   Antimicrobials: Anti-infectives (From admission, onward)    Start     Dose/Rate Route Frequency Ordered Stop   03/08/24 0100  ceFAZolin  (ANCEF ) IVPB 1 g/50 mL premix        1 g 100 mL/hr over 30 Minutes Intravenous Every 8 hours 03/07/24 1811 03/08/24 1932   03/07/24 1314  ceFAZolin  (ANCEF ) IVPB 2g/100 mL premix        2 g 200 mL/hr over 30 Minutes Intravenous 30 min pre-op 03/07/24 1314 03/07/24 1708        I have personally reviewed the following labs and images: CBC: Recent Labs  Lab 03/07/24 1405 03/08/24 0254 03/09/24 0311  WBC 14.9* 13.7* 14.6*  HGB 10.8* 10.1* 10.1*  HCT 34.2* 31.5* 30.6*  MCV 97.4 96.0 94.2  PLT 358 349 364   BMP &GFR Recent Labs  Lab 03/07/24 1441 03/07/24 2118 03/08/24 0254 03/09/24 0311  NA 135 138 137 132*  K 5.1 4.5 4.1 4.2  CL 104 105 102 101  CO2 22 24 26 22   GLUCOSE 312* 219* 200* 270*  BUN 25* 24* 23 22  CREATININE 1.19 1.19 1.19 1.15  CALCIUM  8.2* 8.5* 8.3* 8.4*  MG  --   --  1.9  --   PHOS  --   --  3.6  --    Estimated Creatinine Clearance: 76.6 mL/min (by C-G formula based on SCr of 1.15 mg/dL). Liver & Pancreas: Recent Labs  Lab 03/07/24 1441 03/08/24 0254  AST 24 19  ALT 18 18  ALKPHOS 110 90  BILITOT 1.6* 0.7  PROT 6.2* 6.0*  ALBUMIN  2.4* 2.2*   No results for input(s): LIPASE,  AMYLASE in the last 168 hours. No results for input(s): AMMONIA in the last 168 hours. Diabetic: No results for input(s): HGBA1C in the last 72 hours. Recent Labs  Lab 03/08/24 1635 03/08/24 2118 03/09/24 0618 03/09/24 0825 03/09/24 1207  GLUCAP 381* 480* 224* 261* 357*   Cardiac Enzymes: No results for input(s): CKTOTAL, CKMB, CKMBINDEX, TROPONINI in the last 168 hours. No results for input(s): PROBNP in the last 8760 hours. Coagulation Profile: Recent Labs  Lab 03/07/24 1405  INR 1.0   Thyroid  Function Tests: No results for input(s): TSH, T4TOTAL, FREET4, T3FREE, THYROIDAB in the last 72 hours. Lipid Profile: No results for input(s): CHOL, HDL, LDLCALC, TRIG, CHOLHDL, LDLDIRECT  in the last 72 hours. Anemia Panel: No results for input(s): VITAMINB12, FOLATE, FERRITIN, TIBC, IRON, RETICCTPCT in the last 72 hours. Urine analysis:    Component Value Date/Time   COLORURINE YELLOW 03/07/2024 1419   APPEARANCEUR CLEAR 03/07/2024 1419   APPEARANCEUR Clear 05/29/2020 0818   LABSPEC 1.020 03/07/2024 1419   LABSPEC 1.005 03/02/2014 1416   PHURINE 6.0 03/07/2024 1419   GLUCOSEU >=500 (A) 03/07/2024 1419   GLUCOSEU 300 mg/dL 88/70/7984 8583   HGBUR TRACE (A) 03/07/2024 1419   BILIRUBINUR NEGATIVE 03/07/2024 1419   BILIRUBINUR Negative 05/29/2020 0818   BILIRUBINUR NEGATIVE 03/02/2014 1416   KETONESUR NEGATIVE 03/07/2024 1419   PROTEINUR NEGATIVE 03/07/2024 1419   NITRITE NEGATIVE 03/07/2024 1419   LEUKOCYTESUR NEGATIVE 03/07/2024 1419   LEUKOCYTESUR NEGATIVE 03/02/2014 1416   Sepsis Labs: Invalid input(s): PROCALCITONIN, LACTICIDVEN  Microbiology: No results found for this or any previous visit (from the past 240 hours).  Radiology Studies: No results found.    Sarajane Fambrough T. Adrianne Shackleton [facility]  If 7PM-7AM, please contact night-coverage [email or web address] 03/09/2024, 1:16 PM

## 2024-03-09 NOTE — Progress Notes (Signed)
 PHARMACY - ANTICOAGULATION CONSULT NOTE  Pharmacy Consult for Restart Xarelto  Indication: atrial fibrillation  No Known Allergies  Patient Measurements: Height: 5' 11 (180.3 cm) Weight: 110.2 kg (243 lb) IBW/kg (Calculated) : 75.3 HEPARIN  DW (KG): 99  Vital Signs: Temp: 98.5 F (36.9 C) (12/06 0803) Temp Source: Oral (12/06 0803) BP: 129/59 (12/06 0803) Pulse Rate: 68 (12/06 0803)  Labs: Recent Labs    03/07/24 1405 03/07/24 1441 03/07/24 2118 03/08/24 0254 03/08/24 1726 03/09/24 0311 03/09/24 1116  HGB 10.8*  --   --  10.1*  --  10.1*  --   HCT 34.2*  --   --  31.5*  --  30.6*  --   PLT 358  --   --  349  --  364  --   APTT 29  --   --   --  32 53*  --   LABPROT 14.2  --   --   --   --   --   --   INR 1.0  --   --   --   --   --   --   HEPARINUNFRC  --   --   --   --  0.11* 0.24* 0.56  CREATININE  --    < > 1.19 1.19  --  1.15  --    < > = values in this interval not displayed.    Estimated Creatinine Clearance: 76.6 mL/min (by C-G formula based on SCr of 1.15 mg/dL).   Medical History: Past Medical History:  Diagnosis Date   AKI (acute kidney injury) 03/13/2020   Atrial flutter (HCC)    Coronary artery disease 08/2011   a. NSTEMI 08/2011, LHC w/ severe disease of LCx with good collaterals, lesion was high-risk for intervention due to a steep angulation of the LCx coming from the left main coronary artery as well as heavy calcifications, discused at cath conference w/ consensus advising med Rx; b. 03/2020 Cath: LM nl, LAD 64m, LCX 100p w/ R->L and L->L collats. RCA dominant, nl. EF 55%. PA 33/6/19, PCWP 8.   GERD (gastroesophageal reflux disease)    H/O brain tumor 01/2024   History of echocardiogram    a. 01/2020 Echo: EF 60-65%, no rwma. Sev elev PASP. Mod dil LA.   Hyperlipidemia    Hypertension    Myocardial infarction [facility])    Paroxysmal atrial fibrillation (HCC)    a. s/p ablation 01/2013; b CHADS2VASc => 3 (HTN, DM, vascular disease); c. on Coumadin ;  d. 12/2019 Amio d/c'd 2/2 hyperthyroidism.   Poorly controlled diabetes mellitus (HCC)    RBBB    Sleep apnea    uses nightly   Assessment: 68 year old male s/p left AKA to transition from heparin  back to PTA Xarelto  for Afib.  Heparin  therapeutic this morning.  No bleeding reported  Goal of Therapy:  Therapeutic anticoagulation Monitor platelets by anticoagulation protocol: Yes   Plan:  At 1700, stop Heparin  infusion and give  Xarelto  20 mg PO daily   Thank you for allowing pharmacy to be a part of this patient's care.   Douglas Hunter, PharmD 03/09/2024 12:33 PM  **Pharmacist phone directory can be found on [email or web address] listed under [facility]**

## 2024-03-09 NOTE — Progress Notes (Signed)
 PHARMACY - ANTICOAGULATION CONSULT NOTE  Pharmacy Consult for heparin  Indication: atrial fibrillation  Labs: Recent Labs    03/07/24 1405 03/07/24 1441 03/07/24 2118 03/08/24 0254 03/08/24 1726 03/09/24 0311  HGB 10.8*  --   --  10.1*  --  10.1*  HCT 34.2*  --   --  31.5*  --  30.6*  PLT 358  --   --  349  --  364  APTT 29  --   --   --  32 53*  LABPROT 14.2  --   --   --   --   --   INR 1.0  --   --   --   --   --   HEPARINUNFRC  --   --   --   --  0.11* 0.24*  CREATININE  --    < > 1.19 1.19  --  1.15   < > = values in this interval not displayed.   Assessment: 68yo male subtherapeutic on heparin  after rate change; no infusion issues or signs of bleeding per RN.  Goal of Therapy:  Heparin  level 0.3-0.7 units/ml   Plan:  Increase heparin  infusion by 2 units/kg/hr to 1800 units/hr. Check level in 6 hours.   Marvetta Dauphin, PharmD, BCPS 03/09/2024 4:46 AM

## 2024-03-09 NOTE — Progress Notes (Signed)
 Physical Therapy Treatment Patient Details Name: Douglas Hunter MRN: [id number] DOB: 11-02-1955 Today's Date: 03/09/2024   History of Present Illness 68 yo male presenting with L foot pain. 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 Wife present and states son was here and tried to help him into bathroom from recliner parked at the bathroom door. Per wife, pt nearly fell with him holding pt up so he would not hit his residual limb on the floor. Son and wife spoke afterwards and realized they cannot manage pt at his current level at home and are hopeful for rehab prior to discharge home. Patient seen for multiple transfers (details below) with incr difficulty (+2 mod assist) likely due to his fear of falling. Also, apparently family cannot get a wheelchair for home per [facility] notes and discharge home is not feasible without a wheelchair. Patient can benefit from post-acute inpatient therapies >3 hrs/day for incr repetition of skills to prepare for eventual return home.     If plan is discharge home, recommend the following: Help with stairs or ramp for entrance;Assist for transportation;Assistance with cooking/housework;Two people to help with walking and/or transfers;Direct supervision/assist for medications management;Direct supervision/assist for financial management;Supervision due to cognitive status   Can travel by Doctor, Hospital cushion (measurements PT);Wheelchair (measurements PT);Hoyer lift;Hospital bed (20 inches (per TOC note cannot get because recently got a walker??))    Recommendations for Other Services       Precautions / Restrictions Precautions Precautions: Fall Recall of Precautions/Restrictions:  Impaired Precaution/Restrictions Comments: attempted transfer with son 12/5 with near fall Restrictions Weight Bearing Restrictions Per Provider Order: Yes LLE Weight Bearing Per Provider Order: Non weight bearing Other Position/Activity Restrictions: AKA     Mobility  Bed Mobility Overal bed mobility: Needs Assistance Bed Mobility: Supine to Sit, Sit to Supine     Supine to sit: Min assist, HOB elevated, Used rails Sit to supine: Supervision   General bed mobility comments: Min HH assist for trunk elevation. Supervision to return to supine    Transfers Overall transfer level: Needs assistance Equipment used: Rolling walker (2 wheels), None Transfers: Sit to/from Stand, Bed to chair/wheelchair/BSC Sit to Stand: Mod assist Stand pivot transfers: Mod assist, +2 physical assistance Step pivot transfers:  (pt unable to advance foot with poor use of UEs on RW)       General transfer comment: once EOB attempted lateral scoot towards HOB and pt unable to clear his hips off bed to make any progress (also on rough sheets with rough pad that do not slide); aborted lateral scoot and utilized RW bed to chair with pt having difficulty advancing or even pivoting on RLE and threw himself around and into the chair with significant force; chair to [facility] on his rt with +2 mod assist with stand-pivot no device; from [facility] stood with RW for pericare, then BSC removed and bed pulled up underneath pt and returned to sitting    Ambulation/Gait             Pre-gait activities: Standing with RW and trying to get pt to use UEs to offload RLE with poor results General Gait Details: unable   Stairs  Wheelchair Mobility     Tilt Bed    Modified Rankin (Stroke Patients Only)       Balance Overall balance assessment: Needs assistance Sitting-balance support: No upper extremity supported, Feet supported Sitting balance-[name] Scale: Fair     Standing balance support:  Bilateral upper extremity supported, Reliant on assistive device for balance, During functional activity Standing balance-[name] Scale: Poor Standing balance comment: Reliant on RW                            Communication Communication Communication: Impaired Factors Affecting Communication: Difficulty expressing self  Cognition Arousal: Alert Behavior During Therapy: WFL for tasks assessed/performed, Impulsive   PT - Cognitive impairments: Awareness, Memory, Attention, Sequencing, Problem solving, Safety/Judgement                       PT - Cognition Comments: pt starting to try to stand to RW several times while PT telling him to wait for lines to be prepared; poor attention to instructions during mobility; poor recall of sequencing with hand placement during transfers Following commands: Impaired Following commands impaired:  (during transfer he does not follow instructions)    Cueing Cueing Techniques: Verbal cues, Visual cues  Exercises      General Comments General comments (skin integrity, edema, etc.): Wife present and explained pt had near fall trying to move from recliner (parked at bathroom door) onto toilet yesterday. Patient expressed fear of falling several times during session and ?part of the reason he needed more assist today.      Pertinent Vitals/Pain Pain Assessment Pain Assessment: Faces Faces Pain Scale: Hurts even more Pain Location: LLE Pain Descriptors / Indicators: Discomfort Pain Intervention(s): Limited activity within patient's tolerance, Monitored during session, Repositioned    Home Living                          Prior Function            PT Goals (current goals can now be found in the care plan section) Acute Rehab PT Goals Patient Stated Goal: to return home safety with family and start radiation Time For Goal Achievement: 03/22/24 Potential to Achieve Goals: Good Progress towards PT goals: Progressing  toward goals    Frequency    Min 3X/week      PT Plan      Co-evaluation              AM-PAC PT 6 Clicks Mobility   Outcome Measure  Help needed turning from your back to your side while in a flat bed without using bedrails?: A Little Help needed moving from lying on your back to sitting on the side of a flat bed without using bedrails?: A Little Help needed moving to and from a bed to a chair (including a wheelchair)?: Total Help needed standing up from a chair using your arms (e.g., wheelchair or bedside chair)?: Total Help needed to walk in hospital room?: Total Help needed climbing 3-5 steps with a railing? : Total 6 Click Score: 10    End of Session Equipment Utilized During Treatment: Gait belt Activity Tolerance: Patient tolerated treatment well Patient left: with call bell/phone within reach;in bed;with bed alarm set Nurse Communication: Mobility status PT Visit Diagnosis: Unsteadiness on feet (R26.81);Other abnormalities of gait and mobility (R26.89);Muscle weakness (generalized) (M62.81)     Time: 8965-8886 PT Time Calculation (min) (ACUTE ONLY):  39 min  Charges:    $Therapeutic Activity: 23-37 mins $Self Care/Home Management: 8-22 PT General Charges $$ ACUTE PT VISIT: 1 Visit                      Macario RAMAN, PT Acute Rehabilitation Services  Office [phone]    Macario SHAUNNA Soja 03/09/2024, 11:41 AM

## 2024-03-10 DIAGNOSIS — S78112A Complete traumatic amputation at level between left hip and knee, initial encounter: Secondary | ICD-10-CM | POA: Diagnosis not present

## 2024-03-10 DIAGNOSIS — G4733 Obstructive sleep apnea (adult) (pediatric): Secondary | ICD-10-CM | POA: Diagnosis not present

## 2024-03-10 DIAGNOSIS — I998 Other disorder of circulatory system: Secondary | ICD-10-CM | POA: Diagnosis not present

## 2024-03-10 DIAGNOSIS — D72829 Elevated white blood cell count, unspecified: Secondary | ICD-10-CM | POA: Diagnosis not present

## 2024-03-10 LAB — BASIC METABOLIC PANEL WITH GFR
Anion gap: 8 (ref 5–15)
BUN: 21 mg/dL (ref 8–23)
CO2: 23 mmol/L (ref 22–32)
Calcium: 8.3 mg/dL — ABNORMAL LOW (ref 8.9–10.3)
Chloride: 102 mmol/L (ref 98–111)
Creatinine, Ser: 1.06 mg/dL (ref 0.61–1.24)
GFR, Estimated: >60 mL/min (ref >60)
Glucose, Bld: 266 mg/dL — ABNORMAL HIGH (ref 70–99)
Potassium: 3.9 mmol/L (ref 3.5–5.1)
Sodium: 133 mmol/L — ABNORMAL LOW (ref 135–145)

## 2024-03-10 LAB — CBC
HCT: 29.8 % — ABNORMAL LOW (ref 39.0–52.0)
Hemoglobin: 9.8 g/dL — ABNORMAL LOW (ref 13.0–17.0)
MCH: 30.7 pg (ref 26.0–34.0)
MCHC: 32.9 g/dL (ref 30.0–36.0)
MCV: 93.4 fL (ref 80.0–100.0)
Platelets: 368 K/uL (ref 150–400)
RBC: 3.19 MIL/uL — ABNORMAL LOW (ref 4.22–5.81)
RDW: 14.6 % (ref 11.5–15.5)
WBC: 14.6 K/uL — ABNORMAL HIGH (ref 4.0–10.5)
nRBC: 0 % (ref 0.0–0.2)

## 2024-03-10 LAB — GLUCOSE, CAPILLARY
Glucose-Capillary: 298 mg/dL — ABNORMAL HIGH (ref 70–99)
Glucose-Capillary: 291 mg/dL — ABNORMAL HIGH (ref 70–99)
Glucose-Capillary: 458 mg/dL — ABNORMAL HIGH (ref 70–99)
Glucose-Capillary: 306 mg/dL — ABNORMAL HIGH (ref 70–99)

## 2024-03-10 MED ORDER — INSULIN ASPART 100 UNIT/ML IJ SOLN
8.0000 [IU] | Freq: Three times a day (TID) | INTRAMUSCULAR | Status: DC
  Administered 2024-03-10 – 2024-03-11 (×4): 8 [IU] via SUBCUTANEOUS
  Filled 2024-03-10 (×3): qty 8

## 2024-03-10 MED ORDER — POTASSIUM CHLORIDE CRYS ER 20 MEQ PO TBCR
20.0000 meq | EXTENDED_RELEASE_TABLET | Freq: Once | ORAL | Status: AC
  Administered 2024-03-10: 20 meq via ORAL
  Filled 2024-03-10: qty 1

## 2024-03-10 MED ORDER — INSULIN GLARGINE 100 UNIT/ML ~~LOC~~ SOLN
30.0000 [IU] | Freq: Two times a day (BID) | SUBCUTANEOUS | Status: DC
  Administered 2024-03-10 (×2): 30 [IU] via SUBCUTANEOUS
  Filled 2024-03-10 (×4): qty 0.3

## 2024-03-10 MED ORDER — INSULIN ASPART 100 UNIT/ML IJ SOLN
5.0000 [IU] | Freq: Three times a day (TID) | INTRAMUSCULAR | Status: DC
  Administered 2024-03-10: 5 [IU] via SUBCUTANEOUS
  Filled 2024-03-10: qty 5

## 2024-03-10 NOTE — Progress Notes (Signed)
 PROGRESS NOTE  Douglas Hunter FMW:[id number] DOB: [birth year]   PCP: [name], MD  Patient is from: Home.  Lives with his wife.  DOA: 03/07/2024 LOS: 3  Chief complaints No chief complaint on file.    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  No major postoperative complications.  Therapy recommended CIR.    Subjective: Seen and examined earlier this morning.  No major events overnight or this morning.  No complaints.  Denies pain.  Patient's wife at bedside.   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 -Fall precaution   IDDM-2 with hyperglycemia: A1c 8.0% on 10/28.  Hyperglycemia likely due to steroid and undertreatment.  Uses Lantus  25 units twice daily and Humalog  16 units 3 times daily at home.  He is also on Jardiance  at home. Recent Labs  Lab 03/09/24 0825 03/09/24 1207 03/09/24 1603 03/09/24 2147 03/10/24 0617  GLUCAP 261* 357* 322* 371* 298*  -Increase Semglee  from 20 units twice daily to 30 units twice daily -Increase NovoLog  from 3 to 5 units 3 times daily with meals -Continue SSI-resistant scale -Continue holding Jardiance . -Further adjustment as appropriate.   Atrial flutter/Afib/NSVT: Rate controlled. -Continue Toprol  and Tikosyn  -Continue Xarelto . -Optimize electrolytes   Temporal glioblastoma: Followed by oncology and radiation oncology. -Continue Keppra  and Decadron  -Chemo on hold until AKI heals.  Coronary artery disease: Stable.  No chest pain. -Continue Lipitor , Toprol -XL, benazepril  and Xarelto    Essential hypertension: Normotensive for most  part. -Continue Toprol  XL -Continue home benazepril  at half home dose   OSA on CPAP -Refusing CPAP in the hospital.   Leukocytosis: Mild.  Likely due to steroid and stress from surgery.  Normocytic anemia: Stable after initial drop -Continue monitoring   Hyperlipidemia -Continue lipitor  80 mg po q day  Fall: Wife, patient went down on the floor when he tried to go to the bathroom the afternoon of 12/5.  No prodromes.  No apparent injury or focal deficit. -Fall precaution -PT/OT as above   Class I obesity Body mass index is 33.89 kg/m.          DVT prophylaxis:  SCDs Start: 03/08/24 0006 rivaroxaban  (XARELTO ) tablet 20 mg  Code Status: Full code Family Communication: Updated patient's wife at bedside Level of care: Telemetry Status is: Inpatient Remains inpatient appropriate because: Left AKA   Final disposition: CIR   55 minutes with more than 50% spent in reviewing records, counseling patient/family and coordinating care.  Consultants:  Vascular surgery  Procedures: 12/4-left AKA   Microbiology summarized: None  Objective: Vitals:   03/09/24 2029 03/09/24 2302 03/10/24 0350 03/10/24 0806  BP: 139/64 127/70 138/70 (!) 125/56  Pulse: 65 65 60 65  Resp: 16 20 20 16   Temp: 99 F (37.2 C) 98.3 F (36.8 C) 98.2 F (36.8 C) 99 F (37.2 C)  TempSrc: Oral Oral Oral Oral  SpO2: 99% 94% 97% 99%  Weight:      Height:        Examination:  GENERAL: No apparent distress.  Nontoxic. HEENT: MMM.  Vision and hearing grossly intact.  NECK: Supple.  No apparent JVD.  RESP:  No IWOB.  Fair aeration bilaterally. CVS:  RRR. Heart sounds normal.  ABD/GI/GU: BS+. Abd soft, NTND.  MSK/EXT:  Moves extremities.  Left AKA.  Wound VAC in place.  Small serosanguineous output. SKIN: no apparent skin lesion or wound NEURO: AA.  Oriented appropriately.  No apparent focal neuro deficit. PSYCH: Calm. Normal affect.   Sch Meds:  Scheduled Meds:  atorvastatin   80 mg Oral  QHS   benazepril   20 mg Oral Daily   dexamethasone   2 mg Oral Daily   dofetilide   500 mcg Oral BID   ezetimibe   10 mg Oral Daily   insulin  aspart  0-20 Units Subcutaneous TID WC   insulin  aspart  0-5 Units Subcutaneous QHS   insulin  aspart  5 Units Subcutaneous TID WC   insulin  glargine  30 Units Subcutaneous BID   levETIRAcetam   500 mg Oral BID   metoprolol  succinate  25 mg Oral Daily   pantoprazole   40 mg Oral Daily   rivaroxaban   20 mg Oral Q supper   Continuous Infusions:   PRN Meds:.acetaminophen  **OR** acetaminophen , HYDROmorphone  (DILAUDID ) injection, oxyCODONE -acetaminophen   Antimicrobials: Anti-infectives (From admission, onward)    Start     Dose/Rate Route Frequency Ordered Stop   03/08/24 0100  ceFAZolin  (ANCEF ) IVPB 1 g/50 mL premix        1 g 100 mL/hr over 30 Minutes Intravenous Every 8 hours 03/07/24 1811 03/08/24 1932   03/07/24 1314  ceFAZolin  (ANCEF ) IVPB 2g/100 mL premix        2 g 200 mL/hr over 30 Minutes Intravenous 30 min pre-op 03/07/24 1314 03/07/24 1708        I have personally reviewed the following labs and images: CBC: Recent Labs  Lab 03/07/24 1405 03/08/24 0254 03/09/24 0311 03/10/24 0326  WBC 14.9* 13.7* 14.6* 14.6*  HGB 10.8* 10.1* 10.1* 9.8*  HCT 34.2* 31.5* 30.6* 29.8*  MCV 97.4 96.0 94.2 93.4  PLT 358 349 364 368   BMP &GFR Recent Labs  Lab 03/07/24 1441 03/07/24 2118 03/08/24 0254 03/09/24 0311 03/10/24 0326  NA 135 138 137 132* 133*  K 5.1 4.5 4.1 4.2 3.9  CL 104 105 102 101 102  CO2 22 24 26 22 23   GLUCOSE 312* 219* 200* 270* 266*  BUN 25* 24* 23 22 21   CREATININE 1.19 1.19 1.19 1.15 1.06  CALCIUM  8.2* 8.5* 8.3* 8.4* 8.3*  MG  --   --  1.9  --   --   PHOS  --   --  3.6  --   --    Estimated Creatinine Clearance: 83.1 mL/min (by C-G formula based on SCr of 1.06 mg/dL). Liver & Pancreas: Recent Labs  Lab 03/07/24 1441 03/08/24 0254  AST 24 19  ALT 18 18  ALKPHOS 110 90  BILITOT 1.6* 0.7  PROT 6.2* 6.0*   ALBUMIN  2.4* 2.2*   No results for input(s): LIPASE, AMYLASE in the last 168 hours. No results for input(s): AMMONIA in the last 168 hours. Diabetic: No results for input(s): HGBA1C in the last 72 hours. Recent Labs  Lab 03/09/24 0825 03/09/24 1207 03/09/24 1603 03/09/24 2147 03/10/24 0617  GLUCAP 261* 357* 322* 371* 298*   Cardiac Enzymes: No results for input(s): CKTOTAL, CKMB, CKMBINDEX, TROPONINI in the last 168 hours. No results for input(s): PROBNP in the last 8760 hours. Coagulation Profile: Recent Labs  Lab 03/07/24 1405  INR 1.0   Thyroid  Function Tests: No results for input(s): TSH, T4TOTAL, FREET4, T3FREE, THYROIDAB in the  last 72 hours. Lipid Profile: No results for input(s): CHOL, HDL, LDLCALC, TRIG, CHOLHDL, LDLDIRECT in the last 72 hours. Anemia Panel: No results for input(s): VITAMINB12, FOLATE, FERRITIN, TIBC, IRON, RETICCTPCT in the last 72 hours. Urine analysis:    Component Value Date/Time   COLORURINE YELLOW 03/07/2024 1419   APPEARANCEUR CLEAR 03/07/2024 1419   APPEARANCEUR Clear 05/29/2020 0818   LABSPEC 1.020 03/07/2024 1419   LABSPEC 1.005 03/02/2014 1416   PHURINE 6.0 03/07/2024 1419   GLUCOSEU >=500 (A) 03/07/2024 1419   GLUCOSEU 300 mg/dL 88/70/7984 8583   HGBUR TRACE (A) 03/07/2024 1419   BILIRUBINUR NEGATIVE 03/07/2024 1419   BILIRUBINUR Negative 05/29/2020 0818   BILIRUBINUR NEGATIVE 03/02/2014 1416   KETONESUR NEGATIVE 03/07/2024 1419   PROTEINUR NEGATIVE 03/07/2024 1419   NITRITE NEGATIVE 03/07/2024 1419   LEUKOCYTESUR NEGATIVE 03/07/2024 1419   LEUKOCYTESUR NEGATIVE 03/02/2014 1416   Sepsis Labs: Invalid input(s): PROCALCITONIN, LACTICIDVEN  Microbiology: No results found for this or any previous visit (from the past 240 hours).  Radiology Studies: No results found.    Lulabelle Desta T. Cherril Hett [facility]ist  If 7PM-7AM, please contact  night-coverage [email or web address] 03/10/2024, 10:20 AM

## 2024-03-11 ENCOUNTER — Ambulatory Visit

## 2024-03-11 ENCOUNTER — Other Ambulatory Visit: Payer: Self-pay

## 2024-03-11 ENCOUNTER — Ambulatory Visit: Admitting: Speech Pathology

## 2024-03-11 ENCOUNTER — Inpatient Hospital Stay: Admitting: Pharmacist

## 2024-03-11 DIAGNOSIS — D72829 Elevated white blood cell count, unspecified: Secondary | ICD-10-CM | POA: Diagnosis not present

## 2024-03-11 DIAGNOSIS — S78112A Complete traumatic amputation at level between left hip and knee, initial encounter: Secondary | ICD-10-CM | POA: Diagnosis not present

## 2024-03-11 DIAGNOSIS — I998 Other disorder of circulatory system: Secondary | ICD-10-CM | POA: Diagnosis not present

## 2024-03-11 DIAGNOSIS — G4733 Obstructive sleep apnea (adult) (pediatric): Secondary | ICD-10-CM | POA: Diagnosis not present

## 2024-03-11 LAB — MAGNESIUM: Magnesium: 1.8 mg/dL (ref 1.7–2.4)

## 2024-03-11 LAB — RENAL FUNCTION PANEL
Albumin: 2 g/dL — ABNORMAL LOW (ref 3.5–5.0)
Anion gap: 13 (ref 5–15)
BUN: 27 mg/dL — ABNORMAL HIGH (ref 8–23)
CO2: 21 mmol/L — ABNORMAL LOW (ref 22–32)
Calcium: 8.7 mg/dL — ABNORMAL LOW (ref 8.9–10.3)
Chloride: 101 mmol/L (ref 98–111)
Creatinine, Ser: 1.05 mg/dL (ref 0.61–1.24)
GFR, Estimated: >60 mL/min (ref >60)
Glucose, Bld: 312 mg/dL — ABNORMAL HIGH (ref 70–99)
Phosphorus: 3.6 mg/dL (ref 2.5–4.6)
Potassium: 4.2 mmol/L (ref 3.5–5.1)
Sodium: 135 mmol/L (ref 135–145)

## 2024-03-11 LAB — GLUCOSE, CAPILLARY
Glucose-Capillary: 294 mg/dL — ABNORMAL HIGH (ref 70–99)
Glucose-Capillary: 312 mg/dL — ABNORMAL HIGH (ref 70–99)
Glucose-Capillary: 282 mg/dL — ABNORMAL HIGH (ref 70–99)
Glucose-Capillary: 211 mg/dL — ABNORMAL HIGH (ref 70–99)
Glucose-Capillary: 201 mg/dL — ABNORMAL HIGH (ref 70–99)

## 2024-03-11 LAB — CBC
HCT: 29.7 % — ABNORMAL LOW (ref 39.0–52.0)
Hemoglobin: 9.8 g/dL — ABNORMAL LOW (ref 13.0–17.0)
MCH: 30.7 pg (ref 26.0–34.0)
MCHC: 33 g/dL (ref 30.0–36.0)
MCV: 93.1 fL (ref 80.0–100.0)
Platelets: 398 K/uL (ref 150–400)
RBC: 3.19 MIL/uL — ABNORMAL LOW (ref 4.22–5.81)
RDW: 14.6 % (ref 11.5–15.5)
WBC: 13.7 K/uL — ABNORMAL HIGH (ref 4.0–10.5)
nRBC: 0 % (ref 0.0–0.2)

## 2024-03-11 MED ORDER — MAGNESIUM SULFATE 2 GM/50ML IV SOLN
2.0000 g | Freq: Once | INTRAVENOUS | Status: AC
  Administered 2024-03-11: 2 g via INTRAVENOUS
  Filled 2024-03-11: qty 50

## 2024-03-11 MED ORDER — EMPAGLIFLOZIN 25 MG PO TABS
25.0000 mg | ORAL_TABLET | Freq: Every day | ORAL | Status: DC
  Administered 2024-03-11: 25 mg via ORAL
  Filled 2024-03-11 (×2): qty 1

## 2024-03-11 MED ORDER — INSULIN GLARGINE 100 UNIT/ML ~~LOC~~ SOLN
40.0000 [IU] | Freq: Two times a day (BID) | SUBCUTANEOUS | Status: DC
  Administered 2024-03-11 (×2): 40 [IU] via SUBCUTANEOUS
  Filled 2024-03-11 (×4): qty 0.4

## 2024-03-11 NOTE — Progress Notes (Signed)
 PROGRESS NOTE  Douglas Hunter FMW:[id number] DOB: 1955-11-20   PCP: [name], MD  Patient is from: Home.  Lives with his wife.  DOA: 03/07/2024 LOS: 4  Chief complaints No chief complaint on file.    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  No major postoperative complications other than hyperglycemia.  Therapy recommended CIR. medically stable for discharge.    Subjective: Seen and examined earlier this morning.  No major events overnight or this morning.  No complaints.   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 -Fall precaution   IDDM-2 with hyperglycemia: A1c 8.0% on 10/28.  Hyperglycemia likely due to steroid and undertreatment.  Uses Lantus  25 units twice daily and Humalog  16 units 3 times daily at home.  He is also on Jardiance  at home. Recent Labs  Lab 03/10/24 1627 03/10/24 2110 03/11/24 0556 03/11/24 0959 03/11/24 1152  GLUCAP 458* 306* 294* 312* 282*  -Increase Semglee  from 30 to 40 units twice daily -Continue NovoLog  8 units 3 times daily with meals -Continue SSI-resistant scale -Resume home Jardiance . -Further adjustment as appropriate.   Atrial flutter/Afib/NSVT: Rate controlled. -Continue Toprol  and Tikosyn  -Continue Xarelto . -Optimize electrolytes   Temporal glioblastoma: Followed by oncology and radiation oncology. -Continue Keppra  and Decadron  -Chemo on hold until AKI heals.  Coronary artery disease: Stable.  No chest pain. -Continue Lipitor , Toprol -XL, benazepril  and Xarelto    Essential hypertension: Normotensive for most part. -Continue  Toprol  XL -Continue home benazepril  at half home dose   OSA on CPAP -Refusing CPAP in the hospital.   Leukocytosis: Mild.  Likely due to steroid and stress from surgery.  Normocytic anemia: Stable after initial drop -Continue monitoring   Hyperlipidemia -Continue lipitor  80 mg po q day  Fall: Per wife, he went down on the floor when he tried to go to the bathroom the afternoon of 12/5.  No prodromes.  No apparent injury or focal deficit. -Fall precaution -PT/OT as above   Class I obesity Body mass index is 33.89 kg/m.          DVT prophylaxis:  SCDs Start: 03/08/24 0006 rivaroxaban  (XARELTO ) tablet 20 mg  Code Status: Full code Family Communication: Updated patient's wife at bedside Level of care: Telemetry Status is: Inpatient Remains inpatient appropriate because: Left AKA/CIR bed   Final disposition: CIR   55 minutes with more than 50% spent in reviewing records, counseling patient/family and coordinating care.  Consultants:  Vascular surgery  Procedures: 12/4-left AKA   Microbiology summarized: None  Objective: Vitals:   03/10/24 2323 03/11/24 0351 03/11/24 0951 03/11/24 1150  BP: (!) 137/57 (!) 144/59 126/71 (!) 129/55  Pulse: 61 60 67 69  Resp: 18 18 15 15   Temp: 98.4 F (36.9 C) 98 F (36.7 C) 98.1 F (36.7 C) 98.4 F (36.9 C)  TempSrc: Oral Oral Oral Oral  SpO2: 97% 96% 97% 100%  Weight:      Height:        Examination:  GENERAL: No apparent distress.  Nontoxic. HEENT: MMM.  Vision and hearing grossly intact.  NECK: Supple.  No apparent JVD.  RESP:  No IWOB.  Fair aeration bilaterally. CVS:  RRR. Heart sounds normal.  ABD/GI/GU: BS+. Abd soft, NTND.  MSK/EXT:  Moves extremities.  Left AKA.  Wound VAC in place.  Small serosanguineous output. SKIN: no apparent skin lesion or wound NEURO: AA.  Oriented appropriately.  No apparent focal neuro deficit. PSYCH: Calm. Normal affect.   Sch Meds:  Scheduled Meds:  atorvastatin   80 mg  Oral QHS   benazepril   20 mg Oral Daily   dexamethasone   2 mg Oral Daily   dofetilide   500 mcg Oral BID   empagliflozin   25 mg Oral Daily   ezetimibe   10 mg Oral Daily   insulin  aspart  0-20 Units Subcutaneous TID WC   insulin  aspart  0-5 Units Subcutaneous QHS   insulin  aspart  8 Units Subcutaneous TID WC   insulin  glargine  40 Units Subcutaneous BID   levETIRAcetam   500 mg Oral BID   metoprolol  succinate  25 mg Oral Daily   pantoprazole   40 mg Oral Daily   rivaroxaban   20 mg Oral Q supper   Continuous Infusions:  magnesium  sulfate bolus IVPB      PRN Meds:.acetaminophen  **OR** acetaminophen , HYDROmorphone  (DILAUDID ) injection, oxyCODONE -acetaminophen   Antimicrobials: Anti-infectives (From admission, onward)    Start     Dose/Rate Route Frequency Ordered Stop   03/08/24 0100  ceFAZolin  (ANCEF ) IVPB 1 g/50 mL premix        1 g 100 mL/hr over 30 Minutes Intravenous Every 8 hours 03/07/24 1811 03/08/24 1932   03/07/24 1314  ceFAZolin  (ANCEF ) IVPB 2g/100 mL premix        2 g 200 mL/hr over 30 Minutes Intravenous 30 min pre-op 03/07/24 1314 03/07/24 1708        I have personally reviewed the following labs and images: CBC: Recent Labs  Lab 03/07/24 1405 03/08/24 0254 03/09/24 0311 03/10/24 0326 03/11/24 0350  WBC 14.9* 13.7* 14.6* 14.6* 13.7*  HGB 10.8* 10.1* 10.1* 9.8* 9.8*  HCT 34.2* 31.5* 30.6* 29.8* 29.7*  MCV 97.4 96.0 94.2 93.4 93.1  PLT 358 349 364 368 398   BMP &GFR Recent Labs  Lab 03/07/24 2118 03/08/24 0254 03/09/24 0311 03/10/24 0326 03/11/24 0350  NA 138 137 132* 133* 135  K 4.5 4.1 4.2 3.9 4.2  CL 105 102 101 102 101  CO2 24 26 22 23  21*  GLUCOSE 219* 200* 270* 266* 312*  BUN 24* 23 22 21  27*  CREATININE 1.19 1.19 1.15 1.06 1.05  CALCIUM  8.5* 8.3* 8.4* 8.3* 8.7*  MG  --  1.9  --   --  1.8  PHOS  --  3.6  --   --  3.6   Estimated Creatinine Clearance: 83.9 mL/min (by C-G formula based on SCr of 1.05 mg/dL). Liver & Pancreas: Recent Labs   Lab 03/07/24 1441 03/08/24 0254 03/11/24 0350  AST 24 19  --   ALT 18 18  --   ALKPHOS 110 90  --   BILITOT 1.6* 0.7  --   PROT 6.2* 6.0*  --   ALBUMIN  2.4* 2.2* 2.0*   No results for input(s): LIPASE, AMYLASE in the last 168 hours. No results for input(s): AMMONIA in the last 168 hours. Diabetic: No results for input(s): HGBA1C in the last 72 hours. Recent Labs  Lab 03/10/24 1627 03/10/24 2110 03/11/24 0556 03/11/24 0959 03/11/24 1152  GLUCAP 458* 306* 294* 312* 282*   Cardiac Enzymes: No results for input(s): CKTOTAL, CKMB, CKMBINDEX, TROPONINI in the last 168 hours. No results for  input(s): PROBNP in the last 8760 hours. Coagulation Profile: Recent Labs  Lab 03/07/24 1405  INR 1.0   Thyroid  Function Tests: No results for input(s): TSH, T4TOTAL, FREET4, T3FREE, THYROIDAB in the last 72 hours. Lipid Profile: No results for input(s): CHOL, HDL, LDLCALC, TRIG, CHOLHDL, LDLDIRECT in the last 72 hours. Anemia Panel: No results for input(s): VITAMINB12, FOLATE, FERRITIN, TIBC, IRON, RETICCTPCT in the last 72 hours. Urine analysis:    Component Value Date/Time   COLORURINE YELLOW 03/07/2024 1419   APPEARANCEUR CLEAR 03/07/2024 1419   APPEARANCEUR Clear 05/29/2020 0818   LABSPEC 1.020 03/07/2024 1419   LABSPEC 1.005 03/02/2014 1416   PHURINE 6.0 03/07/2024 1419   GLUCOSEU >=500 (A) 03/07/2024 1419   GLUCOSEU 300 mg/dL 88/70/7984 8583   HGBUR TRACE (A) 03/07/2024 1419   BILIRUBINUR NEGATIVE 03/07/2024 1419   BILIRUBINUR Negative 05/29/2020 0818   BILIRUBINUR NEGATIVE 03/02/2014 1416   KETONESUR NEGATIVE 03/07/2024 1419   PROTEINUR NEGATIVE 03/07/2024 1419   NITRITE NEGATIVE 03/07/2024 1419   LEUKOCYTESUR NEGATIVE 03/07/2024 1419   LEUKOCYTESUR NEGATIVE 03/02/2014 1416   Sepsis Labs: Invalid input(s): PROCALCITONIN, LACTICIDVEN  Microbiology: No results found for this or any previous visit (from the past  240 hours).  Radiology Studies: No results found.    Anand Tejada T. Dontrelle Mazon [facility]  If 7PM-7AM, please contact night-coverage [email or web address] 03/11/2024, 12:30 PM

## 2024-03-11 NOTE — Progress Notes (Signed)
 Occupational Therapy Treatment Patient Details Name: Douglas Hunter MRN: [id number] DOB: 03/08/1956 Today's Date: 03/11/2024   History of present illness 68 yo male presenting with L foot pain. 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 Pt at this time completed multiple lateral scoot transfers at EOB with min - CGA, to R side to chair with CGA to min, back to bed with mod assist to L side, and then back to chair with CGA to L side. Pt when back in bed required peri care with max assist and after return back to bed completed UE bathing dressing post set up. Patient will benefit from intensive inpatient follow-up therapy, >3 hours/day.      If plan is discharge home, recommend the following:  A lot of help with walking and/or transfers;A little help with bathing/dressing/bathroom;Assistance with cooking/housework;Assist for transportation;Help with stairs or ramp for entrance   Equipment Recommendations   (drop arm BSC)    Recommendations for Other Services      Precautions / Restrictions Precautions Precautions: Fall Recall of Precautions/Restrictions: Impaired Precaution/Restrictions Comments: attempted transfer with son 12/5 with near fall Restrictions Weight Bearing Restrictions Per Provider Order: Yes LLE Weight Bearing Per Provider Order: Non weight bearing Other Position/Activity Restrictions: AKA       Mobility Bed Mobility Overal bed mobility: Needs Assistance Bed Mobility: Supine to Sit, Sit to Supine     Supine to sit: Contact guard Sit to supine: Contact guard assist   General bed mobility comments: needs cues on best way to sequence    Transfers Overall transfer level: Needs assistance       Stand pivot transfers: Contact guard assist, Mod assist         General transfer  comment: Pt completed multiple attempts at lateral transfers and when going to R side to chair was CGA to min assist but goig to L side from chair to bed required heavy mod assist.     Balance Overall balance assessment: Needs assistance Sitting-balance support: Feet supported Sitting balance-[name] Scale: Good                                     ADL either performed or assessed with clinical judgement   ADL Overall ADL's : Needs assistance/impaired Eating/Feeding: Set up;Sitting   Grooming: Set up;Sitting   Upper Body Bathing: Set up;Sitting   Lower Body Bathing: Minimal assistance;Sitting/lateral leans   Upper Body Dressing : Set up;Sitting   Lower Body Dressing: Sitting/lateral leans;Sit to/from stand;Minimal assistance Lower Body Dressing Details (indicate cue type and reason): does best in recline attempted in bed and was unable to complete                    Extremity/Trunk Assessment Upper Extremity Assessment Upper Extremity Assessment: Overall WFL for tasks assessed (Pt reported he was going to have shoulder sx but has been acting good latley)            Vision   Vision Assessment?: Wears glasses for reading   Perception     Praxis     Communication Communication Communication: Impaired Factors Affecting Communication: Difficulty expressing self   Cognition Arousal: Alert Behavior During Therapy: WFL for tasks assessed/performed Cognition: Cognition  impaired     Awareness: Online awareness impaired   Attention impairment (select first level of impairment): Divided attention, Alternating attention Executive functioning impairment (select all impairments): Organization, Sequencing, Reasoning, Problem solving OT - Cognition Comments: Pt does best when repeating the task and giving clear direct instructions.                 Following commands: Impaired Following commands impaired: Only follows one step commands consistently       Cueing   Cueing Techniques: Verbal cues, Visual cues  Exercises      Shoulder Instructions       General Comments wife and son present throughout the session    Pertinent Vitals/ Pain       Pain Assessment Pain Assessment: Faces Faces Pain Scale: Hurts a little bit Pain Location: LLE Pain Descriptors / Indicators: Discomfort Pain Intervention(s): Limited activity within patient's tolerance, Monitored during session, Repositioned  Home Living                                          Prior Functioning/Environment              Frequency  Min 2X/week        Progress Toward Goals  OT Goals(current goals can now be found in the care plan section)  Progress towards OT goals: Progressing toward goals  Acute Rehab OT Goals Patient Stated Goal: to go to rehab OT Goal Formulation: With patient Time For Goal Achievement: 03/22/24 Potential to Achieve Goals: Good ADL Goals Pt Will Perform Lower Body Dressing: with min assist;sit to/from stand;sitting/lateral leans Pt Will Transfer to Toilet: with min assist;stand pivot transfer;squat pivot transfer;bedside commode Pt Will Perform Toileting - Clothing Manipulation and hygiene: with min assist;sitting/lateral leans;sit to/from stand  Plan      Co-evaluation                 AM-PAC OT 6 Clicks Daily Activity     Outcome Measure   Help from another person eating meals?: None Help from another person taking care of personal grooming?: A Little Help from another person toileting, which includes using toliet, bedpan, or urinal?: A Lot Help from another person bathing (including washing, rinsing, drying)?: A Lot Help from another person to put on and taking off regular upper body clothing?: A Little Help from another person to put on and taking off regular lower body clothing?: A Lot 6 Click Score: 16    End of Session Equipment Utilized During Treatment: Gait belt  OT Visit Diagnosis:  Unsteadiness on feet (R26.81);Other abnormalities of gait and mobility (R26.89);Muscle weakness (generalized) (M62.81)   Activity Tolerance Patient tolerated treatment well   Patient Left in chair;with call bell/phone within reach (family multiple times requested if they leave to turn on chair alarm)   Nurse Communication Mobility status        Time: 8974-8877 OT Time Calculation (min): 57 min  Charges: OT General Charges $OT Visit: 1 Visit OT Treatments $Self Care/Home Management : 53-67 mins  Warrick POUR OTR/L  Acute Rehab Services  [phone] office number   Warrick Berber 03/11/2024, 11:34 AM

## 2024-03-11 NOTE — Consult Note (Signed)
 Physical Medicine and Rehabilitation Consult Reason for Consult:AKA,Left Referring Physician: [name]   HPI: Douglas Hunter is a 68 y.o. male with poorly controlled DM, Left temporal glioblastoma with Resection per Dr Rosslyn 01/29/24, developed critical limb ischemia with occlusion of Left femoral , tibial, popliteal and peroneal arteries, admitted to [facility] 12/4 for L AKA after failure of attempted revascularization procedure in November 2025.  The patient was to start chemotherapy with Temodar  as well as radiation therapy 5 days a week x 6 weeks.  Because of his arterial disease in the left lower extremity this was not initiated.  He follows up with neuro oncology Dr. [name] as an outpatient.  He lives with his wife who is able to assist this patient is obese and still needs a moderate degree of assistance with mobility.  He has a walker at home but no wheelchair  MinA /Mod Afor transfers but max A for peri care    Home: Home Living Family/patient expects to be discharged to:: Private residence Living Arrangements: Spouse/significant other, Children Available Help at Discharge: Family, Available 24 hours/day Type of Home: House Home Access: Stairs to enter, Ramped entrance (Ramp is in progress) Entrance Stairs-Number of Steps: 2 Entrance Stairs-Rails: Right, Left Home Layout: One level Bathroom Shower/Tub: Health Visitor: Handicapped height Bathroom Accessibility: Yes Home Equipment: Information systems manager - built in, [place] - single point, Crutches, Firefighter, Agricultural Consultant (2 wheels), BSC/3in1  Functional History: Prior Function Prior Level of Function : Needs assist Physical Assist : Mobility (physical) Mobility (physical): Transfers, Gait Mobility Comments: prior to hospitalization pt was requiring some assistance with RW due to pain in the LLE ADLs Comments: Assist from family with LB ADLs Functional Status:  Mobility: Bed Mobility Overal bed mobility: Needs  Assistance Bed Mobility: Supine to Sit, Sit to Supine Supine to sit: Contact guard Sit to supine: Contact guard assist General bed mobility comments: needs cues on best way to sequence Transfers Overall transfer level: Needs assistance Equipment used: Rolling walker (2 wheels), None Transfers: Sit to/from Stand, Bed to chair/wheelchair/BSC Sit to Stand: Mod assist Bed to/from chair/wheelchair/BSC transfer type:: Lateral/scoot transfer Stand pivot transfers: Contact guard assist, Mod assist Step pivot transfers:  (pt unable to advance foot with poor use of UEs on RW)  Lateral/Scoot Transfers: Contact guard assist General transfer comment: Pt completed multiple attempts at lateral transfers and when going to R side to chair was CGA to min assist but goig to L side from chair to bed required heavy mod assist. Ambulation/Gait General Gait Details: unable Pre-gait activities: Standing with RW and trying to get pt to use UEs to offload RLE with poor results    ADL: ADL Overall ADL's : Needs assistance/impaired Eating/Feeding: Set up, Sitting Grooming: Set up, Sitting Upper Body Bathing: Set up, Sitting Lower Body Bathing: Minimal assistance, Sitting/lateral leans Upper Body Dressing : Set up, Sitting Lower Body Dressing: Sitting/lateral leans, Sit to/from stand, Minimal assistance Lower Body Dressing Details (indicate cue type and reason): does best in recline attempted in bed and was unable to complete Toilet Transfer: Stand-pivot, Moderate assistance, Rolling walker (2 wheels) Functional mobility during ADLs: Moderate assistance, Rolling walker (2 wheels) General ADL Comments: Educated pt and wife on use of drop arm bariatric BSC for lateral scoots and other compensatory techniques  Cognition: Cognition Orientation Level: Oriented X4 Cognition Arousal: Alert Behavior During Therapy: WFL for tasks assessed/performed   Review of Systems  Constitutional:  Positive for  malaise/fatigue. Negative for  weight loss.  HENT:  Negative for congestion and sore throat.   Eyes:  Negative for pain and redness.  Respiratory:  Negative for shortness of breath, wheezing and stridor.   Cardiovascular:  Negative for chest pain and palpitations.  Gastrointestinal:  Negative for abdominal pain, constipation, nausea and vomiting.  Genitourinary:  Negative for dysuria and hematuria.  Musculoskeletal:  Negative for back pain and neck pain.  Skin:  Negative for itching and rash.   Past Medical History:  Diagnosis Date   AKI (acute kidney injury) 03/13/2020   Atrial flutter (HCC)    Coronary artery disease 08/2011   a. NSTEMI 08/2011, LHC w/ severe disease of LCx with good collaterals, lesion was high-risk for intervention due to a steep angulation of the LCx coming from the left main coronary artery as well as heavy calcifications, discused at cath conference w/ consensus advising med Rx; b. 03/2020 Cath: LM nl, LAD 60m, LCX 100p w/ R->L and L->L collats. RCA dominant, nl. EF 55%. PA 33/6/19, PCWP 8.   GERD (gastroesophageal reflux disease)    H/O brain tumor 01/2024   History of echocardiogram    a. 01/2020 Echo: EF 60-65%, no rwma. Sev elev PASP. Mod dil LA.   Hyperlipidemia    Hypertension    Myocardial infarction [facility])    Paroxysmal atrial fibrillation (HCC)    a. s/p ablation 01/2013; b CHADS2VASc => 3 (HTN, DM, vascular disease); c. on Coumadin ; d. 12/2019 Amio d/c'd 2/2 hyperthyroidism.   Poorly controlled diabetes mellitus (HCC)    RBBB    Sleep apnea    uses nightly   Past Surgical History:  Procedure Laterality Date   ABLATION  01/29/2013   PVI and CTI by Dr Kelsie for atrial flutter and paroxysmal atrial fibrillation   AMPUTATION Left 03/07/2024   Procedure: AMPUTATION, ABOVE KNEE;  Surgeon: Magda Debby SAILOR, MD;  Location: [facility] OR;  Service: Vascular;  Laterality: Left;   ANTERIOR CERVICAL DECOMP/DISCECTOMY FUSION N/A 04/18/2018   Procedure: ANTERIOR CERVICAL  DECOMPRESSION/DISCECTOMY FUSION 2 LEVELS C5-7;  Surgeon: Clois Fret, MD;  Location: [facility] ORS;  Service: Neurosurgery;  Laterality: N/A;   APPLICATION OF CRANIAL NAVIGATION Left 01/29/2024   Procedure: COMPUTER-ASSISTED NAVIGATION, FOR CRANIAL PROCEDURE;  Surgeon: Rosslyn Dino HERO, MD;  Location: [facility] OR;  Service: Neurosurgery;  Laterality: Left;   APPLICATION OF WOUND VAC Left 03/07/2024   Procedure: APPLICATION, WOUND VAC;  Surgeon: Magda Debby SAILOR, MD;  Location: [facility] OR;  Service: Vascular;  Laterality: Left;   ATRIAL FIBRILLATION ABLATION N/A 01/29/2013   Procedure: ATRIAL FIBRILLATION ABLATION;  Surgeon: Lynwood JONETTA Kelsie, MD;  Location: [facility] CATH LAB;  Service: Cardiovascular;  Laterality: N/A;   ATRIAL FIBRILLATION ABLATION N/A 11/15/2021   Procedure: ATRIAL FIBRILLATION ABLATION;  Surgeon: Cindie Ole DASEN, MD;  Location: [facility] INVASIVE CV LAB;  Service: Cardiovascular;  Laterality: N/A;   CARDIAC CATHETERIZATION  08/03/2011   COLONOSCOPY WITH PROPOFOL  N/A 02/02/2016   Procedure: COLONOSCOPY WITH PROPOFOL ;  Surgeon: Rogelia Copping, MD;  Location: [facility] ENDOSCOPY;  Service: Endoscopy;  Laterality: N/A;   COLONOSCOPY WITH PROPOFOL  N/A 04/20/2023   Procedure: COLONOSCOPY WITH PROPOFOL ;  Surgeon: Copping Rogelia, MD;  Location: [facility] ENDOSCOPY;  Service: Endoscopy;  Laterality: N/A;   CRANIOTOMY Left 01/29/2024   Procedure: CRANIOTOMY TUMOR EXCISION;  Surgeon: Rosslyn Dino HERO, MD;  Location: [facility] OR;  Service: Neurosurgery;  Laterality: Left;  LEFT TEMPORAL MASS RESCETION   ENDARTERECTOMY FEMORAL Left 02/09/2024   Procedure: LEFT FEMORAL ENDARTERECTOMY;  Surgeon: Magda Debby SAILOR, MD;  Location: [facility] OR;  Service: Vascular;  Laterality: Left;   FASCIECTOMY, LOWER EXTREMITY Left 02/09/2024   Procedure: 4 COMPARTMENT FASCIECTOMY, LEFT LOWER EXTREMITY;  Surgeon: Magda Debby SAILOR, MD;  Location: [facility] OR;  Service: Vascular;  Laterality: Left;   LOWER EXTREMITY ANGIOGRAM Left 02/09/2024   Procedure: INTRAOPERATIVE  LEFT LOWER EXTREMITY ANGIOGRAM;  Surgeon: Magda Debby SAILOR, MD;  Location: [facility] OR;  Service: Vascular;  Laterality: Left;   LUMBAR LAMINECTOMY/DECOMPRESSION MICRODISCECTOMY N/A 06/07/2017   Procedure: LUMBAR LAMINECTOMY/DECOMPRESSION MICRODISCECTOMY 2 LEVELS-L3-4,L4-5;  Surgeon: Clois Fret, MD;  Location: [facility] ORS;  Service: Neurosurgery;  Laterality: N/A;   PATCH ANGIOPLASTY  02/09/2024   Procedure: ANGIOPLASTY LEFT FEMORAL ARTERY USING XENOSURE BOVINE PERICARDIUM PATCH;  Surgeon: Magda Debby SAILOR, MD;  Location: [facility] OR;  Service: Vascular;;   POLYPECTOMY  04/20/2023   Procedure: POLYPECTOMY;  Surgeon: Jinny Carmine, MD;  Location: [facility] ENDOSCOPY;  Service: Endoscopy;;   RIGHT/LEFT HEART CATH AND CORONARY ANGIOGRAPHY N/A 03/06/2020   Procedure: RIGHT/LEFT HEART CATH AND CORONARY ANGIOGRAPHY;  Surgeon: Perla Evalene PARAS, MD;  Location: [facility] INVASIVE CV LAB;  Service: Cardiovascular;  Laterality: N/A;   TEE WITHOUT CARDIOVERSION N/A 01/28/2013   Procedure: TRANSESOPHAGEAL ECHOCARDIOGRAM (TEE);  Surgeon: Redell GORMAN Shallow, MD;  Location: [facility] ENDOSCOPY;  Service: Cardiovascular;  Laterality: N/A;   THROMBECTOMY OF BYPASS GRAFT FEMORAL- PERONEAL ARTERY Left 02/09/2024   Procedure: THROMBECTOMY OF LEFT FEMORAL AND POTERIOR TIBIAL  ARTERY;  Surgeon: Magda Debby SAILOR, MD;  Location: [facility] OR;  Service: Vascular;  Laterality: Left;   Family History  Problem Relation Age of Onset   Stroke Mother    Heart disease Mother        pacemaker   Heart attack Father 77   Throat cancer Father        throat   Heart disease Paternal Grandfather        MI   Diabetes Son    Heart attack Maternal Uncle    Social History:  reports that he quit smoking about 16 years ago. His smoking use included cigarettes. He started smoking about 51 years ago. He has a 52.5 pack-year smoking history. He has never used smokeless tobacco. He reports that he does not currently use alcohol. He reports that he does not use  drugs. Allergies: No Known Allergies Medications Prior to Admission  Medication Sig Dispense Refill   acetaminophen  (TYLENOL ) 500 MG tablet Take 500 mg by mouth 4 (four) times daily as needed for mild pain or moderate pain.     atorvastatin  (LIPITOR ) 80 MG tablet Take 80 mg by mouth at bedtime.     benazepril  (LOTENSIN ) 40 MG tablet TAKE 1 TABLET BY MOUTH ONCE DAILY (Patient taking differently: Take 40 mg by mouth daily.) 90 tablet 3   cholecalciferol  (VITAMIN D3) 25 MCG (1000 UNIT) tablet Take 500 Units by mouth daily.     dexamethasone  (DECADRON ) 2 MG tablet Take 1 tablet (2 mg total) by mouth daily. 30 tablet 0   dofetilide  (TIKOSYN ) 500 MCG capsule TAKE 1 CAPSULE BY MOUTH TWICE DAILY 180 capsule 2   ezetimibe  (ZETIA ) 10 MG tablet TAKE 1 TABLET BY MOUTH ONCE DAILY 90 tablet 3   furosemide  (LASIX ) 20 MG tablet TAKE 1 TABLET BY MOUTH TWICE DAILY AS NEEDED (Patient taking differently: Take 20 mg by mouth daily.) 180 tablet 0   insulin  lispro (HUMALOG ) 100 UNIT/ML KiwkPen Inject 16 Units into the skin 3 (three) times daily. Sliding Scale     JARDIANCE  25 MG TABS tablet Take  12.5 mg by mouth daily.     LANTUS  SOLOSTAR 100 UNIT/ML Solostar Pen Inject 25 Units into the skin 2 (two) times daily.     levETIRAcetam  (KEPPRA ) 500 MG tablet Take 1 tablet (500 mg total) by mouth 2 (two) times daily. 60 tablet 0   metoprolol  succinate (TOPROL -XL) 25 MG 24 hr tablet TAKE 1 TABLET BY MOUTH ONCE DAILY 90 tablet 1   nitroGLYCERIN  (NITROSTAT ) 0.4 MG SL tablet Place 1 tablet (0.4 mg total) under the tongue every 5 (five) minutes as needed for chest pain. 25 tablet 1   OVER THE COUNTER MEDICATION Take 1 capsule by mouth daily. Omega XL     oxyCODONE -acetaminophen  (PERCOCET/ROXICET) 5-325 MG tablet Take 1 tablet by mouth every 4 (four) hours as needed for severe pain (pain score 7-10). 20 tablet 0   pantoprazole  (PROTONIX ) 40 MG tablet Take 1 tablet (40 mg total) by mouth daily. Stop taking if no longer on decadron .  30 tablet 3   rivaroxaban  (XARELTO ) 20 MG TABS tablet Take 1 tablet (20 mg total) by mouth daily with supper. 30 tablet 3   baclofen  (LIORESAL ) 10 MG tablet Take 10 mg by mouth 2 (two) times daily as needed (neck pain). (Patient not taking: Reported on 03/06/2024)     butalbital -acetaminophen -caffeine  (BAC, BUTALBITAL -ACETAMIN-CAFF,) 50-325-40 MG tablet Take 2 tablets by mouth every 4 (four) hours as needed for headache. (Patient not taking: Reported on 03/06/2024)     Continuous Blood Gluc Sensor (FREESTYLE LIBRE 14 DAY SENSOR) MISC SMARTSIG:1 Kit(s) Topical Every 2 Weeks     INSULIN  SYRINGE 1CC/29G 29G X 1/2 1 ML MISC USE AS DIRECTED 100 each PRN   [START ON 03/13/2024] ondansetron  (ZOFRAN ) 8 MG tablet Take 1 tablet (8 mg total) by mouth every 8 (eight) hours as needed for nausea or vomiting. May take 30-60 minutes prior to Temodar  administration if nausea/vomiting occurs as needed. (Patient not taking: Reported on 03/06/2024) 30 tablet 1   ONE TOUCH ULTRA TEST test strip TEST THREE TIMES A DAY AS DIRECTED. 100 each 12     Blood pressure (!) 129/55, pulse 69, temperature 98.4 F (36.9 C), temperature source Oral, resp. rate 15, height 5' 11 (1.803 m), weight 110.2 kg, SpO2 100%. Physical Exam Vitals and nursing note reviewed.  Constitutional:      Appearance: He is obese.  HENT:     Head: Normocephalic and atraumatic.  Eyes:     General: No visual field deficit.    Extraocular Movements: Extraocular movements intact.     Conjunctiva/sclera: Conjunctivae normal.     Pupils: Pupils are equal, round, and reactive to light.  Cardiovascular:     Rate and Rhythm: Normal rate and regular rhythm.     Pulses: Normal pulses.     Heart sounds: Normal heart sounds.  Pulmonary:     Effort: Pulmonary effort is normal.     Breath sounds: Normal breath sounds.  Abdominal:     General: Abdomen is flat.  Musculoskeletal:     Right lower leg: No edema.     Left lower leg: Deformity present. No  edema.     Comments: Left above-knee amputation with incisional VAC in place, no pain with range of motion Right lower limb has pedal edema 1+ to the ankle Ankle range of motion mildly reduced on the right side knee range of motion is normal without joint deformity.  No joint deformities noted in the upper limbs  Skin:    General: Skin is warm and  dry.  Neurological:     Mental Status: He is alert and oriented to person, place, and time.     Cranial Nerves: No dysarthria or facial asymmetry.     Sensory: No sensory deficit.     Motor: No weakness, tremor or abnormal muscle tone.     Gait: Gait normal.     Comments: Motor strength is 5/5 bilateral deltoid, bicep, tricep, grip, hip flexor, knee extensor, ankle dorsiflexor and plantar flexor Sensations intact to light touch bilateral upper and lower limbs Finger-nose-finger testing is normal bilaterally Speech no dysarthria or aphasia but he does have mildly increased latency of response.  Appears to be mildly hard of hearing  Psychiatric:        Mood and Affect: Mood normal.        Behavior: Behavior normal.     Results for orders placed or performed during the hospital encounter of 03/07/24 (from the past 24 hours)  Glucose, capillary     Status: Abnormal   Collection Time: 03/10/24  4:27 PM  Result Value Ref Range   Glucose-Capillary 458 (H) 70 - 99 mg/dL  Glucose, capillary     Status: Abnormal   Collection Time: 03/10/24  9:10 PM  Result Value Ref Range   Glucose-Capillary 306 (H) 70 - 99 mg/dL   Comment 1 Notify RN    Comment 2 Document in Chart   Renal function panel     Status: Abnormal   Collection Time: 03/11/24  3:50 AM  Result Value Ref Range   Sodium 135 135 - 145 mmol/L   Potassium 4.2 3.5 - 5.1 mmol/L   Chloride 101 98 - 111 mmol/L   CO2 21 (L) 22 - 32 mmol/L   Glucose, Bld 312 (H) 70 - 99 mg/dL   BUN 27 (H) 8 - 23 mg/dL   Creatinine, Ser 8.94 0.61 - 1.24 mg/dL   Calcium  8.7 (L) 8.9 - 10.3 mg/dL   Phosphorus  3.6 2.5 - 4.6 mg/dL   Albumin  2.0 (L) 3.5 - 5.0 g/dL   GFR, Estimated >39 >39 mL/min   Anion gap 13 5 - 15  Magnesium      Status: None   Collection Time: 03/11/24  3:50 AM  Result Value Ref Range   Magnesium  1.8 1.7 - 2.4 mg/dL  CBC     Status: Abnormal   Collection Time: 03/11/24  3:50 AM  Result Value Ref Range   WBC 13.7 (H) 4.0 - 10.5 K/uL   RBC 3.19 (L) 4.22 - 5.81 MIL/uL   Hemoglobin 9.8 (L) 13.0 - 17.0 g/dL   HCT 70.2 (L) 60.9 - 47.9 %   MCV 93.1 80.0 - 100.0 fL   MCH 30.7 26.0 - 34.0 pg   MCHC 33.0 30.0 - 36.0 g/dL   RDW 85.3 88.4 - 84.4 %   Platelets 398 150 - 400 K/uL   nRBC 0.0 0.0 - 0.2 %  Glucose, capillary     Status: Abnormal   Collection Time: 03/11/24  5:56 AM  Result Value Ref Range   Glucose-Capillary 294 (H) 70 - 99 mg/dL   Comment 1 Notify RN    Comment 2 Document in Chart   Glucose, capillary     Status: Abnormal   Collection Time: 03/11/24  9:59 AM  Result Value Ref Range   Glucose-Capillary 312 (H) 70 - 99 mg/dL  Glucose, capillary     Status: Abnormal   Collection Time: 03/11/24 11:52 AM  Result Value Ref Range   Glucose-Capillary  282 (H) 70 - 99 mg/dL   No results found.  Assessment/Plan: Diagnosis: Left above-the-knee amputation due to peripheral vascular disease Does the need for close, 24 hr/day medical supervision in concert with the patient's rehab needs make it unreasonable for this patient to be served in a less intensive setting? Yes Co-Morbidities requiring supervision/potential complications:  - Glioblastoma status postresection awaiting chemotherapy and radiation therapy, uncontrolled diabetes Due to bladder management, bowel management, safety, skin/wound care, disease management, medication administration, pain management, and patient education, does the patient require 24 hr/day rehab nursing? Yes Does the patient require coordinated care of a physician, rehab nurse, therapy disciplines of PT, OT to address physical and functional  deficits in the context of the above medical diagnosis(es)? Yes Addressing deficits in the following areas: balance, endurance, locomotion, strength, transferring, bowel/bladder control, bathing, dressing, feeding, grooming, toileting, and psychosocial support Can the patient actively participate in an intensive therapy program of at least 3 hrs of therapy per day at least 5 days per week? Yes The potential for patient to make measurable gains while on inpatient rehab is good Anticipated functional outcomes upon discharge from inpatient rehab are modified independent and supervision  with PT, modified independent and supervision with OT, n/a with SLP. Estimated rehab length of stay to reach the above functional goals is: 10 to 14 days Anticipated discharge destination: Home Overall Rehab/Functional Prognosis: good  POST ACUTE RECOMMENDATIONS: This patient's condition is appropriate for continued rehabilitative care in the following setting: CIR Patient has agreed to participate in recommended program. Yes Note that insurance prior authorization may be required for reimbursement for recommended care.  Comment: Wife is interested in pursuing purchase or rental of a wheelchair   MEDICAL RECOMMENDATIONS: Discontinue IV pain medication   I have personally performed a face to face diagnostic evaluation of this patient. Additionally, I have examined the patient's medical record including any pertinent labs and radiographic images.    Thanks,  Prentice FORBES Compton, MD 03/11/2024

## 2024-03-11 NOTE — Progress Notes (Addendum)
  Progress Note    03/11/2024 8:12 AM 4 Days Post-Op  Subjective: No complaints, says that his left AKA is less sore   Vitals:   03/10/24 2323 03/11/24 0351  BP: (!) 137/57 (!) 144/59  Pulse: 61 60  Resp: 18 18  Temp: 98.4 F (36.9 C) 98 F (36.7 C)  SpO2: 97% 96%    Physical Exam: General: Resting in bed comfortably, NAD Lungs: Nonlabored Incisions: Left AKA with incisional VAC with good seal  CBC    Component Value Date/Time   WBC 13.7 (H) 03/11/2024 0350   RBC 3.19 (L) 03/11/2024 0350   HGB 9.8 (L) 03/11/2024 0350   HGB 13.8 06/28/2023 0946   HCT 29.7 (L) 03/11/2024 0350   HCT 41.7 06/28/2023 0946   PLT 398 03/11/2024 0350   PLT 232 06/28/2023 0946   MCV 93.1 03/11/2024 0350   MCV 93 06/28/2023 0946   MCV 95 03/02/2014 1416   MCH 30.7 03/11/2024 0350   MCHC 33.0 03/11/2024 0350   RDW 14.6 03/11/2024 0350   RDW 12.3 06/28/2023 0946   RDW 12.8 03/02/2014 1416   LYMPHSABS 0.9 02/09/2024 1347   LYMPHSABS 2.0 10/26/2016 1423   LYMPHSABS 1.5 03/02/2014 1416   MONOABS 0.6 02/09/2024 1347   MONOABS 1.3 (H) 03/02/2014 1416   EOSABS 0.0 02/09/2024 1347   EOSABS 0.3 10/26/2016 1423   EOSABS 0.3 03/02/2014 1416   BASOSABS 0.0 02/09/2024 1347   BASOSABS 0.0 10/26/2016 1423   BASOSABS 0.1 03/02/2014 1416    BMET    Component Value Date/Time   NA 135 03/11/2024 0350   NA 140 06/28/2023 0946   NA 131 (L) 03/02/2014 1416   K 4.2 03/11/2024 0350   K 4.1 03/02/2014 1416   CL 101 03/11/2024 0350   CL 98 03/02/2014 1416   CO2 21 (L) 03/11/2024 0350   CO2 24 03/02/2014 1416   GLUCOSE 312 (H) 03/11/2024 0350   GLUCOSE 374 (H) 03/02/2014 1416   BUN 27 (H) 03/11/2024 0350   BUN 26 06/28/2023 0946   BUN 18 03/02/2014 1416   CREATININE 1.05 03/11/2024 0350   CREATININE 1.27 03/02/2014 1416   CALCIUM  8.7 (L) 03/11/2024 0350   CALCIUM  8.7 03/02/2014 1416   GFRNONAA >60 03/11/2024 0350   GFRNONAA >60 03/02/2014 1416   GFRNONAA >60 01/12/2013 0013   GFRAA 53 (L)  02/20/2020 0925   GFRAA >60 03/02/2014 1416   GFRAA >60 01/12/2013 0013    INR    Component Value Date/Time   INR 1.0 03/07/2024 1405   INR 1.1 01/11/2013 1615     Intake/Output Summary (Last 24 hours) at 03/11/2024 9187 Last data filed at 03/11/2024 9193 Gross per 24 hour  Intake 240 ml  Output 300 ml  Net -60 ml      Assessment/Plan:  68 y.o. male is 4 days postop, s/p: Left AKA   - He is doing well this morning without any complaints.  His left AKA pain is well-controlled -Left AKA with incisional VAC with good seal -Left AKA stump appears viable without hematoma -Will keep VAC on until 1 week postop or until patient is ready for discharge.  Patient has been recommended for CIR - Once his VAC is removed he will follow-up with our office in 4 weeks for staple removal   Ahmed Holster, PA-C [facility] [phone] 03/11/2024 8:12 AM

## 2024-03-11 NOTE — Plan of Care (Signed)

## 2024-03-11 NOTE — Progress Notes (Signed)
 Inpatient Rehab Coordinator Note:  I spoke with patient's spouse over the phone to discuss CIR recommendations and goals/expectations of CIR stay.  We reviewed 3 hrs/day of therapy, physician follow up, and average length of stay 2 weeks (dependent upon progress) with goals of supervision to modified independence.  Spouse is retired and can provide expected level of assist.  We reviewed prior auth process and I will start that with [facility] Medicare today.  Also will touch base with Dr. [name] and Dr. Ubaldo with oncology and radiation oncology about plan for glioblastoma treatment.    Reche Lowers, PT, DPT Admissions Coordinator [phone] 03/11/24 1:58 PM

## 2024-03-12 ENCOUNTER — Ambulatory Visit: Admitting: Radiation Oncology

## 2024-03-12 LAB — BLOOD GAS, VENOUS
Acid-Base Excess: 1.2 mmol/L (ref 0.0–2.0)
Bicarbonate: 26 mmol/L (ref 20.0–28.0)
O2 Saturation: 43.4 %
Patient temperature: 37.1
pCO2, Ven: 41 mmHg — ABNORMAL LOW (ref 44–60)
pH, Ven: 7.41 (ref 7.25–7.43)
pO2, Ven: <31 mmHg — CL (ref 32–45)

## 2024-03-12 LAB — RENAL FUNCTION PANEL
Albumin: 2.2 g/dL — ABNORMAL LOW (ref 3.5–5.0)
Albumin: 2.5 g/dL — ABNORMAL LOW (ref 3.5–5.0)
Anion gap: 17 — ABNORMAL HIGH (ref 5–15)
Anion gap: 11 (ref 5–15)
BUN: 31 mg/dL — ABNORMAL HIGH (ref 8–23)
BUN: 31 mg/dL — ABNORMAL HIGH (ref 8–23)
CO2: 15 mmol/L — ABNORMAL LOW (ref 22–32)
CO2: 22 mmol/L (ref 22–32)
Calcium: 8.5 mg/dL — ABNORMAL LOW (ref 8.9–10.3)
Calcium: 8.6 mg/dL — ABNORMAL LOW (ref 8.9–10.3)
Chloride: 102 mmol/L (ref 98–111)
Chloride: 102 mmol/L (ref 98–111)
Creatinine, Ser: 1.31 mg/dL — ABNORMAL HIGH (ref 0.61–1.24)
Creatinine, Ser: 1.33 mg/dL — ABNORMAL HIGH (ref 0.61–1.24)
GFR, Estimated: 59 mL/min — ABNORMAL LOW (ref >60)
GFR, Estimated: 58 mL/min — ABNORMAL LOW (ref >60)
Glucose, Bld: 316 mg/dL — ABNORMAL HIGH (ref 70–99)
Glucose, Bld: 276 mg/dL — ABNORMAL HIGH (ref 70–99)
Phosphorus: 4.6 mg/dL (ref 2.5–4.6)
Phosphorus: 4.8 mg/dL — ABNORMAL HIGH (ref 2.5–4.6)
Potassium: 4.2 mmol/L (ref 3.5–5.1)
Potassium: 4.6 mmol/L (ref 3.5–5.1)
Sodium: 134 mmol/L — ABNORMAL LOW (ref 135–145)
Sodium: 135 mmol/L (ref 135–145)

## 2024-03-12 LAB — MAGNESIUM: Magnesium: 2 mg/dL (ref 1.7–2.4)

## 2024-03-12 LAB — CBC
HCT: 33.1 % — ABNORMAL LOW (ref 39.0–52.0)
Hemoglobin: 10.8 g/dL — ABNORMAL LOW (ref 13.0–17.0)
MCH: 31 pg (ref 26.0–34.0)
MCHC: 32.6 g/dL (ref 30.0–36.0)
MCV: 95.1 fL (ref 80.0–100.0)
Platelets: 490 K/uL — ABNORMAL HIGH (ref 150–400)
RBC: 3.48 MIL/uL — ABNORMAL LOW (ref 4.22–5.81)
RDW: 14.6 % (ref 11.5–15.5)
WBC: 14.7 K/uL — ABNORMAL HIGH (ref 4.0–10.5)
nRBC: 0 % (ref 0.0–0.2)

## 2024-03-12 LAB — GLUCOSE, CAPILLARY
Glucose-Capillary: 336 mg/dL — ABNORMAL HIGH (ref 70–99)
Glucose-Capillary: 218 mg/dL — ABNORMAL HIGH (ref 70–99)
Glucose-Capillary: 233 mg/dL — ABNORMAL HIGH (ref 70–99)
Glucose-Capillary: 243 mg/dL — ABNORMAL HIGH (ref 70–99)

## 2024-03-12 LAB — SURGICAL PATHOLOGY

## 2024-03-12 MED ORDER — INSULIN ASPART 100 UNIT/ML IJ SOLN
10.0000 [IU] | Freq: Three times a day (TID) | INTRAMUSCULAR | Status: DC
  Administered 2024-03-12 – 2024-03-15 (×10): 10 [IU] via SUBCUTANEOUS
  Filled 2024-03-12 (×2): qty 10
  Filled 2024-03-12: qty 1
  Filled 2024-03-12: qty 10
  Filled 2024-03-12 (×2): qty 1
  Filled 2024-03-12 (×3): qty 10

## 2024-03-12 MED ORDER — LACTATED RINGERS IV SOLN: INTRAVENOUS | Status: DC

## 2024-03-12 MED ORDER — LACTATED RINGERS IV BOLUS
1000.0000 mL | Freq: Once | INTRAVENOUS | Status: AC
  Administered 2024-03-12: 1000 mL via INTRAVENOUS

## 2024-03-12 MED ORDER — LACTATED RINGERS IV SOLN: INTRAVENOUS | Status: AC

## 2024-03-12 MED ORDER — INSULIN GLARGINE 100 UNIT/ML ~~LOC~~ SOLN
45.0000 [IU] | Freq: Two times a day (BID) | SUBCUTANEOUS | Status: DC
  Administered 2024-03-12 – 2024-03-15 (×7): 45 [IU] via SUBCUTANEOUS
  Filled 2024-03-12 (×8): qty 0.45

## 2024-03-12 MED ORDER — LORAZEPAM 0.5 MG PO TABS
0.5000 mg | ORAL_TABLET | Freq: Two times a day (BID) | ORAL | Status: DC | PRN
  Administered 2024-03-12 – 2024-03-13 (×2): 0.5 mg via ORAL
  Filled 2024-03-12 (×2): qty 1

## 2024-03-12 NOTE — Progress Notes (Signed)
 PROGRESS NOTE  Douglas Hunter FMW:[id number] DOB: February 20, 1956   PCP: [name], MD  Patient is from: Home.  Lives with his wife.  DOA: 03/07/2024 LOS: 5  Chief complaints No chief complaint on file.    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  No major postoperative complications other than hyperglycemia.  Therapy recommended CIR. medically stable for discharge.    Subjective: Seen and examined earlier this morning.  No major events overnight or this morning.  Mild AKI and DKA this morning.  He feels well.  No complaints.   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 -Fall precaution   IDDM-2 with hyperglycemia and mild DKA: A1c 8.0% on 10/28.  Hyperglycemia likely due to steroid. Uses Lantus  25 units twice daily and Humalog  16 units 3 times daily at home.  He is also on Jardiance  at home.  Labs this morning with mild AKI and HAGMA but normal pH. Recent Labs  Lab 03/11/24 0959 03/11/24 1152 03/11/24 1628 03/11/24 2120 03/12/24 0609  GLUCAP 312* 282* 211* 201* 336*  -Continue Semglee  40 units twice daily -Increase NovoLog  from 8 to 10 units 3 times daily with meals -Continue SSI-resistant scale -Discontinue Jardiance  -LR bolus 1 L followed by LR 75 cc an hour -Recheck renal panel this afternoon. -Further adjustment as appropriate.   Atrial flutter/Afib/NSVT: Rate controlled. -Continue Toprol  and Tikosyn  -Continue Xarelto . -Optimize electrolytes   Temporal glioblastoma: Followed by oncology and radiation oncology. -Continue Keppra  and Decadron  -Chemo on hold until AKA  heals.  Coronary artery disease: Stable.  No chest pain. -Continue Lipitor , Toprol -XL, benazepril  and Xarelto    Essential hypertension: Normotensive for most part. -Continue Toprol  XL -Continue home benazepril  at half home dose  AKI/HAGMA: Due to Jardiance ? -IV fluid as above -Recheck renal panel this afternoon -May hold benazepril  if renal function worse.   OSA on CPAP -Refusing CPAP in the hospital.   Leukocytosis: Mild.  Likely due to steroid and stress from surgery.  Normocytic anemia: Stable after initial drop -Continue monitoring   Hyperlipidemia -Continue lipitor  80 mg po q day  Fall: Per wife, he went down on the floor when he tried to go to the bathroom the afternoon of 12/5.  No prodromes.  No apparent injury or focal deficit. -Fall precaution -PT/OT as above   Class I obesity Body mass index is 33.89 kg/m.          DVT prophylaxis:  SCDs Start: 03/08/24 0006 rivaroxaban  (XARELTO ) tablet 20 mg  Code Status: Full code Family Communication: Updated patient's wife at bedside Level of care: Telemetry Status is: Inpatient Remains inpatient appropriate because: Left AKA/CIR bed   Final disposition: CIR   55 minutes with more than 50% spent in reviewing records, counseling patient/family and coordinating care.  Consultants:  Vascular surgery  Procedures: 12/4-left AKA   Microbiology summarized: None  Objective: Vitals:   03/11/24 1947 03/12/24 0058 03/12/24 0357 03/12/24 0824  BP: (!) 141/71 (!) 161/82 (!) 149/71 (!) 140/82  Pulse:  64 60 78  Resp: 20  20 15   Temp: 97.6 F (36.4 C) 98.1 F (36.7 C) 98 F (36.7 C)  98.1 F (36.7 C)  TempSrc: Oral Oral Oral Oral  SpO2: 96% 98% 94% 94%  Weight:      Height:        Examination:  GENERAL: No apparent distress.  Nontoxic. HEENT: MMM.  Vision and hearing grossly intact.  NECK: Supple.  No apparent JVD.  RESP:  No IWOB.  Fair aeration bilaterally. CVS:  RRR. Heart sounds normal.   ABD/GI/GU: BS+. Abd soft, NTND.  MSK/EXT:  Moves extremities.  Left AKA.  Wound VAC in place.  Small serosanguineous output. SKIN: no apparent skin lesion or wound NEURO: AA.  Oriented appropriately.  No apparent focal neuro deficit. PSYCH: Calm. Normal affect.   Sch Meds:  Scheduled Meds:  atorvastatin   80 mg Oral QHS   benazepril   20 mg Oral Daily   dexamethasone   2 mg Oral Daily   dofetilide   500 mcg Oral BID   ezetimibe   10 mg Oral Daily   insulin  aspart  0-20 Units Subcutaneous TID WC   insulin  aspart  0-5 Units Subcutaneous QHS   insulin  aspart  10 Units Subcutaneous TID WC   insulin  glargine  45 Units Subcutaneous BID   levETIRAcetam   500 mg Oral BID   metoprolol  succinate  25 mg Oral Daily   pantoprazole   40 mg Oral Daily   rivaroxaban   20 mg Oral Q supper   Continuous Infusions:  lactated ringers       PRN Meds:.acetaminophen  **OR** acetaminophen , HYDROmorphone  (DILAUDID ) injection, oxyCODONE -acetaminophen   Antimicrobials: Anti-infectives (From admission, onward)    Start     Dose/Rate Route Frequency Ordered Stop   03/08/24 0100  ceFAZolin  (ANCEF ) IVPB 1 g/50 mL premix        1 g 100 mL/hr over 30 Minutes Intravenous Every 8 hours 03/07/24 1811 03/08/24 1932   03/07/24 1314  ceFAZolin  (ANCEF ) IVPB 2g/100 mL premix        2 g 200 mL/hr over 30 Minutes Intravenous 30 min pre-op 03/07/24 1314 03/07/24 1708        I have personally reviewed the following labs and images: CBC: Recent Labs  Lab 03/08/24 0254 03/09/24 0311 03/10/24 0326 03/11/24 0350 03/12/24 0341  WBC 13.7* 14.6* 14.6* 13.7* 14.7*  HGB 10.1* 10.1* 9.8* 9.8* 10.8*  HCT 31.5* 30.6* 29.8* 29.7* 33.1*  MCV 96.0 94.2 93.4 93.1 95.1  PLT 349 364 368 398 490*   BMP &GFR Recent Labs  Lab 03/08/24 0254 03/09/24 0311 03/10/24 0326 03/11/24 0350 03/12/24 0341  NA 137 132* 133* 135 134*  K 4.1 4.2 3.9 4.2 4.2  CL 102 101 102 101 102  CO2 26 22 23  21* 15*  GLUCOSE 200* 270* 266* 312* 316*   BUN 23 22 21  27* 31*  CREATININE 1.19 1.15 1.06 1.05 1.31*  CALCIUM  8.3* 8.4* 8.3* 8.7* 8.5*  MG 1.9  --   --  1.8 2.0  PHOS 3.6  --   --  3.6 4.6   Estimated Creatinine Clearance: 67.2 mL/min (A) (by C-G formula based on SCr of 1.31 mg/dL (H)). Liver & Pancreas: Recent Labs  Lab 03/07/24 1441 03/08/24 0254 03/11/24 0350 03/12/24 0341  AST 24 19  --   --   ALT 18 18  --   --   ALKPHOS 110 90  --   --   BILITOT 1.6* 0.7  --   --   PROT 6.2* 6.0*  --   --   ALBUMIN  2.4* 2.2* 2.0* 2.2*   No results for input(s): LIPASE, AMYLASE in  the last 168 hours. No results for input(s): AMMONIA in the last 168 hours. Diabetic: No results for input(s): HGBA1C in the last 72 hours. Recent Labs  Lab 03/11/24 0959 03/11/24 1152 03/11/24 1628 03/11/24 2120 03/12/24 0609  GLUCAP 312* 282* 211* 201* 336*   Cardiac Enzymes: No results for input(s): CKTOTAL, CKMB, CKMBINDEX, TROPONINI in the last 168 hours. No results for input(s): PROBNP in the last 8760 hours. Coagulation Profile: Recent Labs  Lab 03/07/24 1405  INR 1.0   Thyroid  Function Tests: No results for input(s): TSH, T4TOTAL, FREET4, T3FREE, THYROIDAB in the last 72 hours. Lipid Profile: No results for input(s): CHOL, HDL, LDLCALC, TRIG, CHOLHDL, LDLDIRECT in the last 72 hours. Anemia Panel: No results for input(s): VITAMINB12, FOLATE, FERRITIN, TIBC, IRON, RETICCTPCT in the last 72 hours. Urine analysis:    Component Value Date/Time   COLORURINE YELLOW 03/07/2024 1419   APPEARANCEUR CLEAR 03/07/2024 1419   APPEARANCEUR Clear 05/29/2020 0818   LABSPEC 1.020 03/07/2024 1419   LABSPEC 1.005 03/02/2014 1416   PHURINE 6.0 03/07/2024 1419   GLUCOSEU >=500 (A) 03/07/2024 1419   GLUCOSEU 300 mg/dL 88/70/7984 8583   HGBUR TRACE (A) 03/07/2024 1419   BILIRUBINUR NEGATIVE 03/07/2024 1419   BILIRUBINUR Negative 05/29/2020 0818   BILIRUBINUR NEGATIVE 03/02/2014 1416    KETONESUR NEGATIVE 03/07/2024 1419   PROTEINUR NEGATIVE 03/07/2024 1419   NITRITE NEGATIVE 03/07/2024 1419   LEUKOCYTESUR NEGATIVE 03/07/2024 1419   LEUKOCYTESUR NEGATIVE 03/02/2014 1416   Sepsis Labs: Invalid input(s): PROCALCITONIN, LACTICIDVEN  Microbiology: No results found for this or any previous visit (from the past 240 hours).  Radiology Studies: No results found.    Iya Hamed T. Elvan [facility]ist  If 7PM-7AM, please contact night-coverage [email or web address] 03/12/2024, 11:20 AM

## 2024-03-12 NOTE — Care Management Important Message (Signed)
 Important Message  Patient Details  Name: Douglas Hunter MRN: [id number] Date of Birth: 22-Feb-1956   Important Message Given:  Yes - Medicare IM     [name] 03/12/2024, 11:47 AM

## 2024-03-12 NOTE — Plan of Care (Signed)

## 2024-03-12 NOTE — Progress Notes (Signed)
 Inpatient Rehab Admissions Coordinator:   Insurance auth pending.    Reche Lowers, PT, DPT Admissions Coordinator [phone] 03/12/24 9:57 AM

## 2024-03-12 NOTE — Progress Notes (Signed)
 Mobility Specialist Progress Note;   03/12/24 1545  Mobility  Activity Pivoted/transferred from chair to bed  Level of Assistance Minimal assist, patient does 75% or more  Assistive Device Front wheel walker  LLE Weight Bearing Per Provider Order NWB  Activity Response Tolerated well  Mobility Referral Yes  Mobility visit 1 Mobility  Mobility Specialist Start Time (ACUTE ONLY) 1545  Mobility Specialist Stop Time (ACUTE ONLY) 1555  Mobility Specialist Time Calculation (min) (ACUTE ONLY) 10 min   Pt requesting assistance transferring back to bed. Required MinA to stand from chair so wife could assist w/ pericare. Pt then was able to sit and laterally scoot to bed w/ MinG assistance. VSS throughout and no c/o when asked. Pt left in bed with all needs met, alarm on. Wife present.   Lauraine Erm Mobility Specialist Please contact via SecureChat or [employer] [phone]

## 2024-03-12 NOTE — Progress Notes (Signed)
 Physical Therapy Treatment Patient Details Name: Douglas Hunter MRN: [id number] DOB: 09-17-1955 Today's Date: 03/12/2024   History of Present Illness 68 yo male presenting with L foot pain. Pt s/p 12/4 L AKA PMH afib on xarleto,  DM2 HLD 10/27 resection of L temporal glioblastoma OSA on CPAP, AKI, CAD, MI, RBBB, 11/7 L fem-pop thrombectoendarterectomy    PT Comments  Pt resting in bed on arrival, pleasant and agreeable to session and demonstrating continued progress towards acute goals. Pt able to come to sitting EOB with CGA for safety with cues for hand placement and sequencing. Pt standing from EOB x3 during session with min A to steady on rise to RW, with pt needing explicit cues each transfer for optimal hand placement. Pt performing standing pre-gait activities to progress stand-pivot abilities as pt unable to hop on RLE due to UE weakness. Pt with poor heel clearance with pivoting R foot in place in/out. Pt performing seated heel raises for increased ROM with fair clearance, encouraged pt to perform throughout day between therapies. Pt performing lateral scoot transfer to L EOB>chair with CGA for safety. Patient will benefit from intensive inpatient follow-up therapy, >3 hours/day and continues to benefit from skilled PT services to progress toward functional mobility goals.     If plan is discharge home, recommend the following: Help with stairs or ramp for entrance;Assist for transportation;Assistance with cooking/housework;Two people to help with walking and/or transfers;Direct supervision/assist for medications management;Direct supervision/assist for financial management;Supervision due to cognitive status   Can travel by Doctor, Hospital cushion (measurements PT);Wheelchair (measurements PT);Hoyer lift;Hospital bed (20 inches (per TOC note cannot get because recently got a walker??))    Recommendations for Other Services        Precautions / Restrictions Precautions Precautions: Fall Recall of Precautions/Restrictions: Impaired Precaution/Restrictions Comments: attempted transfer with son 12/5 with near fall Restrictions Weight Bearing Restrictions Per Provider Order: Yes LLE Weight Bearing Per Provider Order: Non weight bearing Other Position/Activity Restrictions: AKA     Mobility  Bed Mobility Overal bed mobility: Needs Assistance Bed Mobility: Supine to Sit     Supine to sit: Contact guard     General bed mobility comments: cues for sequencing and increased time to complete    Transfers Overall transfer level: Needs assistance Equipment used: Rolling walker (2 wheels), None Transfers: Sit to/from Stand, Bed to chair/wheelchair/BSC Sit to Stand: Min assist          Lateral/Scoot Transfers: Contact guard assist General transfer comment: pt standing from EOB x3 with min A with dense cues for hand placement on each stand. Pt able to laterally scoot to L with CGA for safety from slightly elevated EOB    Ambulation/Gait             Pre-gait activities: Standing with RW, attempting heel raises with poor clearance, working on rotation of foot on heel R/L to prep for being able to the kroger heel/toe on RLE to pivot transfer General Gait Details: unable   Stairs             Wheelchair Mobility     Tilt Bed    Modified Rankin (Stroke Patients Only)       Balance Overall balance assessment: Needs assistance Sitting-balance support: Feet supported Sitting balance-[name] Scale: Good     Standing balance support: Bilateral upper extremity supported, Reliant on assistive device for balance, During functional activity Standing balance-[name] Scale: Poor Standing balance comment: Reliant  on RW                            Communication Communication Communication: Impaired Factors Affecting Communication: Difficulty expressing self  Cognition Arousal: Alert Behavior  During Therapy: WFL for tasks assessed/performed   PT - Cognitive impairments: Awareness, Memory, Attention, Sequencing, Problem solving, Safety/Judgement                       PT - Cognition Comments: poor attention to instructions during mobility; poor recall of sequencing with hand placement during transfers Following commands: Impaired Following commands impaired: Only follows one step commands consistently    Cueing Cueing Techniques: Verbal cues, Visual cues  Exercises      General Comments General comments (skin integrity, edema, etc.): Spouse present and supportive      Pertinent Vitals/Pain Pain Assessment Pain Assessment: No/denies pain Pain Intervention(s): Monitored during session    Home Living                          Prior Function            PT Goals (current goals can now be found in the care plan section) Acute Rehab PT Goals Patient Stated Goal: to return home safety with family and start radiation PT Goal Formulation: With patient/family Time For Goal Achievement: 03/22/24 Progress towards PT goals: Progressing toward goals    Frequency    Min 3X/week      PT Plan      Co-evaluation              AM-PAC PT 6 Clicks Mobility   Outcome Measure  Help needed turning from your back to your side while in a flat bed without using bedrails?: A Little Help needed moving from lying on your back to sitting on the side of a flat bed without using bedrails?: A Little Help needed moving to and from a bed to a chair (including a wheelchair)?: Total Help needed standing up from a chair using your arms (e.g., wheelchair or bedside chair)?: A Lot Help needed to walk in hospital room?: Total Help needed climbing 3-5 steps with a railing? : Total 6 Click Score: 11    End of Session   Activity Tolerance: Patient tolerated treatment well Patient left: with call bell/phone within reach;in chair;with chair alarm set;with  family/visitor present Nurse Communication: Mobility status PT Visit Diagnosis: Unsteadiness on feet (R26.81);Other abnormalities of gait and mobility (R26.89);Muscle weakness (generalized) (M62.81)     Time: 8595-8571 PT Time Calculation (min) (ACUTE ONLY): 24 min  Charges:    $Gait Training: 8-22 mins $Therapeutic Activity: 8-22 mins PT General Charges $$ ACUTE PT VISIT: 1 Visit                     Maisa Bedingfield R. PTA Acute Rehabilitation Services Office: [phone]   Therisa CHRISTELLA Boor 03/12/2024, 2:50 PM

## 2024-03-13 ENCOUNTER — Ambulatory Visit: Admitting: Speech Pathology

## 2024-03-13 LAB — GLUCOSE, CAPILLARY
Glucose-Capillary: 122 mg/dL — ABNORMAL HIGH (ref 70–99)
Glucose-Capillary: 121 mg/dL — ABNORMAL HIGH (ref 70–99)
Glucose-Capillary: 197 mg/dL — ABNORMAL HIGH (ref 70–99)
Glucose-Capillary: 246 mg/dL — ABNORMAL HIGH (ref 70–99)

## 2024-03-13 LAB — CBC
HCT: 31.1 % — ABNORMAL LOW (ref 39.0–52.0)
Hemoglobin: 10.2 g/dL — ABNORMAL LOW (ref 13.0–17.0)
MCH: 31.3 pg (ref 26.0–34.0)
MCHC: 32.8 g/dL (ref 30.0–36.0)
MCV: 95.4 fL (ref 80.0–100.0)
Platelets: 492 K/uL — ABNORMAL HIGH (ref 150–400)
RBC: 3.26 MIL/uL — ABNORMAL LOW (ref 4.22–5.81)
RDW: 14.6 % (ref 11.5–15.5)
WBC: 13.1 K/uL — ABNORMAL HIGH (ref 4.0–10.5)
nRBC: 0 % (ref 0.0–0.2)

## 2024-03-13 LAB — RENAL FUNCTION PANEL
Albumin: 2.1 g/dL — ABNORMAL LOW (ref 3.5–5.0)
Anion gap: 8 (ref 5–15)
BUN: 29 mg/dL — ABNORMAL HIGH (ref 8–23)
CO2: 25 mmol/L (ref 22–32)
Calcium: 8.3 mg/dL — ABNORMAL LOW (ref 8.9–10.3)
Chloride: 102 mmol/L (ref 98–111)
Creatinine, Ser: 1.44 mg/dL — ABNORMAL HIGH (ref 0.61–1.24)
GFR, Estimated: 53 mL/min — ABNORMAL LOW (ref >60)
Glucose, Bld: 181 mg/dL — ABNORMAL HIGH (ref 70–99)
Phosphorus: 4.5 mg/dL (ref 2.5–4.6)
Potassium: 3.9 mmol/L (ref 3.5–5.1)
Sodium: 135 mmol/L (ref 135–145)

## 2024-03-13 LAB — MAGNESIUM: Magnesium: 1.9 mg/dL (ref 1.7–2.4)

## 2024-03-13 MED ORDER — POTASSIUM CHLORIDE CRYS ER 20 MEQ PO TBCR
40.0000 meq | EXTENDED_RELEASE_TABLET | Freq: Once | ORAL | Status: AC
  Administered 2024-03-13: 40 meq via ORAL
  Filled 2024-03-13: qty 2

## 2024-03-13 MED ORDER — MAGNESIUM SULFATE IN D5W 1-5 GM/100ML-% IV SOLN
1.0000 g | Freq: Once | INTRAVENOUS | Status: AC
  Administered 2024-03-13: 1 g via INTRAVENOUS
  Filled 2024-03-13: qty 100

## 2024-03-13 NOTE — PMR Pre-admission (Signed)
 PMR Admission Coordinator Pre-Admission Assessment  Patient: Douglas Hunter is an 68 y.o., male MRN: [id number] DOB: March 21, 1956 Height: 5' 11 (180.3 cm) Weight: 110.2 kg  Insurance Information HMO: yes    PPO:      PCP:      IPA:      80/20:      OTHER:  PRIMARY: [employer] Medicare      Policy#: [id number]       Subscriber: pt CM Name: Douglas Hunter      Phone#: [phone]     Fax#: [phone] Pre-Cert#: [id number]  auth for CIR from Douglas Hunter with [facility] Medicare expedited appeals department for admit 12/12 with next review date 12/18.  Updates due to fax listed above.        Employer:  Benefits:  Phone #: [phone]     Name:  Eff. Date: 04/05/23     Deduct: $0      Out of Pocket Max: $4900 (met $1545.01)      Life Max: n/a CIR: $325/day for days 1-5      SNF: 20 full days Outpatient:      Co-Pay: $20/visit Home Health: 100%      Co-Pay:  DME: 80%     Co-Pay: 20% Providers:  SECONDARY:       Policy#:      Phone#:   Artist:       Phone#:   The Best Boy for patients in Inpatient Rehabilitation Facilities with attached Privacy Act Statement-Health Care Records was provided and verbally reviewed with: Patient and Family  Emergency Contact Information Contact Information     Name Relation Home Work Mobile   Douglas Hunter Spouse   [phone]   Douglas, Hunter   [phone]   Douglas, Hunter   [phone]      Other Contacts   None on File     Current Medical History  Patient Admitting Diagnosis: L AKA, recent crani for glioblastoma  History of Present Illness: Pt is a 68 y/o male with PMH of afib (xarelto ), DM, OSA (cpap), CAD, MI, RBBB, with 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 Rosslyn, and recent revascularization attempt of LLE on 11/7 who presented to [facility] on [facility]07[facility]24 with L foot plan and underwent L AKA per Dr. Magda for non salvageable LLE.  The pt was initially planned to start chemo/radiation  but because of the LE arterial disease this is on hold.  To f/u with Dr. [name] and Dr. Ubaldo outpatient.  Hospital course hyperglycemia/DKA due to dexamethasone  for glio.  He did have a fall on 12/5 while attempting to mobilize with family.  Therapy recommendations are for CIR to maximize independence and prefunctional status for glio treatment and reduce caregiver burden.     Patient's medical record from [facility] has been reviewed by the rehabilitation admission coordinator and physician.  Past Medical History  Past Medical History:  Diagnosis Date   AKI (acute kidney injury) 03/13/2020   Atrial flutter (HCC)    Coronary artery disease 08/2011   a. NSTEMI 08/2011, [facility] Hunter/ severe disease of LCx with good collaterals, lesion was high-risk for intervention due to a steep angulation of the LCx coming from the left main coronary artery as well as heavy calcifications, discused at cath conference Hunter/ consensus advising med Rx; b. 03/2020 Cath: LM nl, LAD 73m, LCX 100p Hunter/ R->L and L->L collats. RCA dominant, nl. EF 55%. PA 33/6/19, PCWP 8.   GERD (gastroesophageal reflux disease)  H/O brain tumor 01/2024   History of echocardiogram    a. 01/2020 Echo: EF 60-65%, no rwma. Sev elev PASP. Mod dil LA.   Hyperlipidemia    Hypertension    Myocardial infarction [facility])    Paroxysmal atrial fibrillation (HCC)    a. s/p ablation 01/2013; b CHADS2VASc => 3 (HTN, DM, vascular disease); c. on Coumadin ; d. 12/2019 Amio d/c'd 2/2 hyperthyroidism.   Poorly controlled diabetes mellitus (HCC)    RBBB    Sleep apnea    uses nightly    Has the patient had major surgery during 100 days prior to admission? Yes  Family History   family history includes Diabetes in his son; Heart attack in his maternal uncle; Heart attack (age of onset: 79) in his father; Heart disease in his mother and paternal grandfather; Stroke in his mother; Throat cancer in his father.  Current Medications  Current  Facility-Administered Medications:    acetaminophen  (TYLENOL ) tablet 650 mg, 650 mg, Oral, Q6H PRN **OR** acetaminophen  (TYLENOL ) suppository 650 mg, 650 mg, Rectal, Q6H PRN, Doutova, Anastassia, MD   atorvastatin  (LIPITOR ) tablet 80 mg, 80 mg, Oral, QHS, Doutova, Anastassia, MD, 80 mg at 03/12/24 2156   benazepril  (LOTENSIN ) tablet 20 mg, 20 mg, Oral, Daily, Gonfa, Taye T, MD, 20 mg at 03/12/24 0850   dexamethasone  (DECADRON ) tablet 2 mg, 2 mg, Oral, Daily, Doutova, Anastassia, MD, 2 mg at 03/13/24 9140   dofetilide  (TIKOSYN ) capsule 500 mcg, 500 mcg, Oral, BID, Doutova, Anastassia, MD, 500 mcg at 03/13/24 0858   ezetimibe  (ZETIA ) tablet 10 mg, 10 mg, Oral, Daily, Doutova, Anastassia, MD, 10 mg at 03/13/24 0858   HYDROmorphone  (DILAUDID ) injection 0.5-1 mg, 0.5-1 mg, Intravenous, Q2H PRN, Eveland, Matthew, PA-C, 0.5 mg at 03/08/24 9166   insulin  aspart (novoLOG ) injection 0-20 Units, 0-20 Units, Subcutaneous, TID WC, Gonfa, Taye T, MD, 3 Units at 03/13/24 0646   insulin  aspart (novoLOG ) injection 0-5 Units, 0-5 Units, Subcutaneous, QHS, Gonfa, Taye T, MD, 2 Units at 03/12/24 2155   insulin  aspart (novoLOG ) injection 10 Units, 10 Units, Subcutaneous, TID WC, Gonfa, Taye T, MD, 10 Units at 03/13/24 0858   insulin  glargine (LANTUS ) injection 45 Units, 45 Units, Subcutaneous, BID, Gonfa, Taye T, MD, 45 Units at 03/13/24 0859   levETIRAcetam  (KEPPRA ) tablet 500 mg, 500 mg, Oral, BID, Doutova, Anastassia, MD, 500 mg at 03/13/24 0858   LORazepam  (ATIVAN ) tablet 0.5 mg, 0.5 mg, Oral, Q12H PRN, Gonfa, Taye T, MD, 0.5 mg at 03/12/24 1603   magnesium  sulfate IVPB 1 g 100 mL, 1 g, Intravenous, Once, Pham, Minh Q, RPH-CPP   metoprolol  succinate (TOPROL -XL) 24 hr tablet 25 mg, 25 mg, Oral, Daily, Doutova, Anastassia, MD, 25 mg at 03/13/24 0858   oxyCODONE -acetaminophen  (PERCOCET/ROXICET) 5-325 MG per tablet 1-2 tablet, 1-2 tablet, Oral, Q4H PRN, Eveland, Matthew, PA-C, 1 tablet at 03/08/24 1038   pantoprazole   (PROTONIX ) EC tablet 40 mg, 40 mg, Oral, Daily, Doutova, Anastassia, MD, 40 mg at 03/13/24 0901   rivaroxaban  (XARELTO ) tablet 20 mg, 20 mg, Oral, Q supper, Gonfa, Taye T, MD, 20 mg at 03/12/24 1714  Facility-Administered Medications Ordered in Other Encounters:    sodium chloride  flush (NS) 0.9 % injection 3 mL, 3 mL, Intravenous, Q12H, Visser, Jacquelyn D, PA-C  Patients Current Diet:  Diet Order             Diet heart healthy/carb modified Room service appropriate? Yes; Fluid consistency: Thin  Diet effective now  Precautions / Restrictions Precautions Precautions: Fall Precaution/Restrictions Comments: attempted transfer with son 12/5 with near fall Restrictions Weight Bearing Restrictions Per Provider Order: Yes LLE Weight Bearing Per Provider Order: Non weight bearing Other Position/Activity Restrictions: AKA   Has the patient had 2 or more falls or a fall with injury in the past year? Yes  Prior Activity Level Community (5-7x/wk): prior to October independent without DME, since crani has been requiring up to min assist for mobility/ADLs  Prior Functional Level Self Care: Did the patient need help bathing, dressing, using the toilet or eating? Needed some help  Indoor Mobility: Did the patient need assistance with walking from room to room (with or without device)? Needed some help  Stairs: Did the patient need assistance with internal or external stairs (with or without device)? Needed some help  Functional Cognition: Did the patient need help planning regular tasks such as shopping or remembering to take medications? Needed some help  Patient Information Are you of Hispanic, Latino/a,or Spanish origin?: A. No, not of Hispanic, Latino/a, or Spanish origin What is your race?: A. White Do you need or want an interpreter to communicate with a doctor or health care staff?: 0. No  Patient's Response To:  Health Literacy and Transportation Is the  patient able to respond to health literacy and transportation needs?: Yes Health Literacy - How often do you need to have someone help you when you read instructions, pamphlets, or other written material from your doctor or pharmacy?: Never In the past 12 months, has lack of transportation kept you from medical appointments or from getting medications?: No In the past 12 months, has lack of transportation kept you from meetings, work, or from getting things needed for daily living?: No  Journalist, Newspaper / Equipment Home Equipment: Information systems manager - built in, [place] - single point, Crutches, Firefighter, Agricultural Consultant (2 wheels), BSC/3in1  Prior Device Use: Indicate devices/aids used by the patient prior to current illness, exacerbation or injury? Walker  Current Functional Level Cognition  Orientation Level: Oriented X4    Extremity Assessment (includes Sensation/Coordination)  Upper Extremity Assessment: Overall WFL for tasks assessed (Pt reported he was going to have shoulder sx but has been acting good latley)  Lower Extremity Assessment: Defer to PT evaluation LLE Deficits / Details: AKA    ADLs  Overall ADL's : Needs assistance/impaired Eating/Feeding: Set up, Sitting Grooming: Set up, Sitting Upper Body Bathing: Set up, Sitting Lower Body Bathing: Minimal assistance, Sitting/lateral leans Upper Body Dressing : Set up, Sitting Lower Body Dressing: Sitting/lateral leans, Sit to/from stand, Minimal assistance Lower Body Dressing Details (indicate cue type and reason): does best in recline attempted in bed and was unable to complete Toilet Transfer: Stand-pivot, Moderate assistance, Rolling walker (2 wheels) Functional mobility during ADLs: Moderate assistance, Rolling walker (2 wheels) General ADL Comments: Educated pt and wife on use of drop arm bariatric BSC for lateral scoots and other compensatory techniques    Mobility  Overal bed mobility: Needs Assistance Bed  Mobility: Supine to Sit Supine to sit: Contact guard Sit to supine: Contact guard assist General bed mobility comments: cues for sequencing and increased time to complete    Transfers  Overall transfer level: Needs assistance Equipment used: Rolling walker (2 wheels), None Transfers: Sit to/from Stand, Bed to chair/wheelchair/BSC Sit to Stand: Min assist Bed to/from chair/wheelchair/BSC transfer type:: Lateral/scoot transfer Stand pivot transfers: Contact guard assist, Mod assist Step pivot transfers:  (pt unable to advance foot with poor use  of UEs on RW)  Lateral/Scoot Transfers: Contact guard assist General transfer comment: pt standing from EOB x3 with min A with dense cues for hand placement on each stand. Pt able to laterally scoot to L with CGA for safety from slightly elevated EOB    Ambulation / Gait / Stairs / Wheelchair Mobility  Ambulation/Gait General Gait Details: unable Pre-gait activities: Standing with RW, attempting heel raises with poor clearance, working on rotation of foot on heel R/L to prep for being able to the kroger heel/toe on RLE to pivot transfer    Posture / Balance Balance Overall balance assessment: Needs assistance Sitting-balance support: Feet supported Sitting balance-[name] Scale: Good Standing balance support: Bilateral upper extremity supported, Reliant on assistive device for balance, During functional activity Standing balance-[name] Scale: Poor Standing balance comment: Reliant on RW    Special considerations/life events  CPAP, Wound Vac LLE amputation, Skin L AKA site, L temporal crani site (healing), and Diabetic management yes   Previous Home Environment (from acute therapy documentation) Living Arrangements: Spouse/significant other, Children Available Help at Discharge: Family, Available 24 hours/day Type of Home: House Home Layout: One level Home Access: Stairs to enter, Ramped entrance (Ramp is in progress) Entrance Stairs-Rails: Right,  Left Entrance Stairs-Number of Steps: 2 Bathroom Shower/Tub: Health Visitor: Handicapped height Bathroom Accessibility: Yes Home Care Services: No  Discharge Living Setting Plans for Discharge Living Setting: Patient's home, Lives with (comment) (spouse [name]) Type of Home at Discharge: House Discharge Home Layout: One level Discharge Home Access: Stairs to enter, Ramped entrance Entrance Stairs-Rails: Right, Left Entrance Stairs-Number of Steps: 2 Discharge Bathroom Shower/Tub: Walk-in shower Discharge Bathroom Toilet: Handicapped height Discharge Bathroom Accessibility: Yes How Accessible: Accessible via walker Does the patient have any problems obtaining your medications?: No  Social/Family/Support Systems Patient Roles: Spouse Anticipated Caregiver: [name] Anticipated Caregiver's Contact Information: [phone] Ability/Limitations of Caregiver: none stated Caregiver Availability: 24/7 Discharge Plan Discussed with Primary Caregiver: Yes Is Caregiver In Agreement with Plan?: Yes Does Caregiver/Family have Issues with Lodging/Transportation while Pt is in Rehab?: No  Goals Patient/Family Goal for Rehab: PT/OT supervision, SLP n/a Expected length of stay: 10-14 days Additional Information: Discharge plan: home with spouse 24/7 and son PRN.  Can provide physical assist if needed, but anticipate supervision level goals Pt/Family Agrees to Admission and willing to participate: Yes Program Orientation Provided & Reviewed with Pt/Caregiver Including Roles  & Responsibilities: Yes Additional Information Needs: Outpatient f/u with rad onc for radiation and onc for systemic treatment of glio (confirmed on 12/8) Information Needs to be Provided By: Dr. [name] (rad/onc), Dr. Lenn (onc)  Decrease burden of Care through IP rehab admission: na/  Possible need for SNF placement upon discharge: Not anticipated.  Plan for discharge home with spouse and son providing  24/7 support as prior to admit.    Patient Condition: This patient's medical and functional status has changed since the consult dated: 03/11/2024 in which the Rehabilitation Physician determined and documented that the patient's condition is appropriate for intensive rehabilitative care in an inpatient rehabilitation facility. See History of Present Illness (above) for medical update. Functional changes are: pt is min assist for limited mobility and ADLs, has not been able to progress with ambulation. Patient's medical and functional status update has been discussed with the Rehabilitation physician and patient remains appropriate for inpatient rehabilitation. Will admit to inpatient rehab today.  Preadmission Screen Completed By:  Reche FORBES Lowers, PT, DPT 03/13/2024 9:13 AM ______________________________________________________________________   Discussed status with Dr. Emeline  on 03/13/24  at 9:20 AM  and received approval for admission today.  Admission Coordinator:  Caitlin E Warren, PT, DPT time 9:20 AM Pattricia 03/13/24    Assessment/Plan: Diagnosis: Left above-the-knee amputation due to peripheral vascular disease Does the need for close, 24 hr/day medical supervision in concert with the patient's rehab needs make it unreasonable for this patient to be served in a less intensive setting? Yes Co-Morbidities requiring supervision/potential complications:  - Glioblastoma status postresection awaiting chemotherapy and radiation therapy, uncontrolled diabetes Due to bladder management, bowel management, safety, skin/wound care, disease management, medication administration, pain management, and patient education, does the patient require 24 hr/day rehab nursing? Yes Does the patient require coordinated care of a physician, rehab nurse, therapy disciplines of PT, OT to address physical and functional deficits in the context of the above medical diagnosis(es)? Yes Addressing deficits in the  following areas: balance, endurance, locomotion, strength, transferring, bowel/bladder control, bathing, dressing, feeding, grooming, toileting, and psychosocial support Can the patient actively participate in an intensive therapy program of at least 3 hrs of therapy per day at least 5 days per week? Yes The potential for patient to make measurable gains while on inpatient rehab is good Anticipated functional outcomes upon discharge from inpatient rehab are modified independent and supervision  with PT, modified independent and supervision with OT, n/a with SLP. Estimated rehab length of stay to reach the above functional goals is: 10 to 14 days Anticipated discharge destination: Home Overall Rehab/Functional Prognosis: good   MD Signature:  Joesph JAYSON Likes, DO 03/15/2024

## 2024-03-13 NOTE — Progress Notes (Signed)
 Inpatient Rehab Admissions Coordinator:   Dr. Urbano completed peer to peer discussion with [facility] representative and CIR prior auth request has been denied. I reached out to pt/family via phone and they would like to appeal, which I agree with.  I feel that given pt's complex medical course over the last 8 weeks combined with functional decline and ability to participate/progress with therapy he is an excellent candidate.  I will start the appeal today and we will follow for determination.   Reche Lowers, PT, DPT Admissions Coordinator [phone] 03/13/24 3:47 PM

## 2024-03-13 NOTE — Progress Notes (Signed)
 Ok to give kcl 40meq x1 and mag 1g IV x1 to keep k above 4 and mg above 2 per Dr. Cosette.  Sergio Batch, PharmD, BCIDP, AAHIVP, CPP Infectious Disease Pharmacist 03/13/2024 8:18 AM

## 2024-03-13 NOTE — Progress Notes (Signed)
 Occupational Therapy Treatment Patient Details Name: Douglas Hunter MRN: [id number] DOB: [birth year] Today's Date: 03/13/2024   History of present illness 68 yo male presenting with L foot pain. Pt s/p 12/4 L AKA PMH afib on xarleto,  DM2 HLD 10/27 resection of L temporal glioblastoma OSA on CPAP, AKI, CAD, MI, RBBB, 11/7 L fem-pop thrombectoendarterectomy   OT comments  Pt progressing well towards goals. Session focused on improving lateral scoot transfers to drop arm bariatric BSC. Pt completed x2 bed<>BSC transfers with CGA. Cueing for sequencing and hand placement.  Pt and wife educated on use of compensatory techniques for pericare. Pt continues to be limited by decreased strength, balance, and activity tolerance. Continue to recommend >3 hours of skilled rehab daily to optimize independence levels. Will continue to follow acutely.       If plan is discharge home, recommend the following:  A lot of help with walking and/or transfers;A little help with bathing/dressing/bathroom;Assistance with cooking/housework;Assist for transportation;Help with stairs or ramp for entrance   Equipment Recommendations  Other (comment) (Drop arm bariatric BSC)       Precautions / Restrictions Precautions Precautions: Fall Recall of Precautions/Restrictions: Impaired Restrictions Weight Bearing Restrictions Per Provider Order: Yes LLE Weight Bearing Per Provider Order: Non weight bearing Other Position/Activity Restrictions: AKA       Mobility Bed Mobility Overal bed mobility: Needs Assistance Bed Mobility: Supine to Sit, Sit to Supine     Supine to sit: Supervision, HOB elevated, Used rails Sit to supine: Supervision, HOB elevated, Used rails   General bed mobility comments: S for safety, heavy reliance on bed features    Transfers Overall transfer level: Needs assistance Equipment used: None Transfers: Bed to chair/wheelchair/BSC            Lateral/Scoot Transfers: Contact  guard assist General transfer comment: CGA for safety with lateral scoots bed<>drop arm bari BSC x2     Balance Overall balance assessment: Needs assistance Sitting-balance support: Feet supported Sitting balance-[name] Scale: Good       ADL either performed or assessed with clinical judgement   ADL Overall ADL's : Needs assistance/impaired       Toilet Transfer: Contact guard assist;BSC/3in1;Requires drop arm;Requires Warden/ranger Details (indicate cue type and reason): Lateral scoots to drop arm bari BSC         Functional mobility during ADLs: Contact guard assist General ADL Comments: CGA for lateral scoots x 2 for safety    Extremity/Trunk Assessment Upper Extremity Assessment Upper Extremity Assessment: Overall WFL for tasks assessed   Lower Extremity Assessment Lower Extremity Assessment: Defer to PT evaluation        Vision   Vision Assessment?: Wears glasses for reading         Communication Communication Communication: Impaired Factors Affecting Communication: Difficulty expressing self   Cognition Arousal: Alert Behavior During Therapy: WFL for tasks assessed/performed Cognition: Cognition impaired     Awareness: Online awareness impaired   Attention impairment (select first level of impairment): Divided attention, Alternating attention Executive functioning impairment (select all impairments): Organization, Sequencing, Reasoning, Problem solving   Following commands: Impaired Following commands impaired: Only follows one step commands consistently      Cueing   Cueing Techniques: Verbal cues, Visual cues, Gestural cues, Tactile cues        General Comments spouse present during session and supportive    Pertinent Vitals/ Pain       Pain Assessment Pain Assessment: No/denies pain   Frequency  Min 2X/week  Progress Toward Goals  OT Goals(current goals can now be found in the care plan section)  Progress  towards OT goals: Progressing toward goals  Acute Rehab OT Goals Patient Stated Goal: To get better OT Goal Formulation: With patient Time For Goal Achievement: 03/22/24 Potential to Achieve Goals: Good ADL Goals Pt Will Perform Lower Body Dressing: with min assist;sit to/from stand;sitting/lateral leans Pt Will Transfer to Toilet: with min assist;stand pivot transfer;squat pivot transfer;bedside commode Pt Will Perform Toileting - Clothing Manipulation and hygiene: with min assist;sitting/lateral leans;sit to/from stand  Plan         AM-PAC OT 6 Clicks Daily Activity     Outcome Measure   Help from another person eating meals?: None Help from another person taking care of personal grooming?: A Little Help from another person toileting, which includes using toliet, bedpan, or urinal?: A Lot Help from another person bathing (including washing, rinsing, drying)?: A Lot Help from another person to put on and taking off regular upper body clothing?: A Little Help from another person to put on and taking off regular lower body clothing?: A Lot 6 Click Score: 16    End of Session Equipment Utilized During Treatment: Gait belt  OT Visit Diagnosis: Unsteadiness on feet (R26.81);Other abnormalities of gait and mobility (R26.89);Muscle weakness (generalized) (M62.81)   Activity Tolerance Patient tolerated treatment well   Patient Left in bed;with call bell/phone within reach;with bed alarm set;with family/visitor present   Nurse Communication Mobility status        Time: 8498-8475 OT Time Calculation (min): 23 min  Charges: OT General Charges $OT Visit: 1 Visit OT Treatments $Self Care/Home Management : 23-37 mins  Adrianne BROCKS, OT  Acute Rehabilitation Services Office [phone] Secure chat preferred   Adrianne GORMAN Savers 03/13/2024, 3:36 PM

## 2024-03-13 NOTE — Progress Notes (Signed)
 PROGRESS NOTE    Douglas Hunter  FMW:[id number] DOB: 1956-01-02 DOA: 03/07/2024 PCP: [name], MD  Subjective:  No acute events overnight. Seen and examined at bedside with son present. Tolerating oral intake without n/v. Denies constipation.   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 No major postoperative complications other than hyperglycemia.  Therapy recommended CIR. medically stable for discharge.   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 -Fall precaution   IDDM-2 Hyperglycemia - improving - A1c 8.0% on 10/28.  Hyperglycemia likely due to steroid.  - Uses Lantus  25 units twice daily and Humalog  16 units 3 times daily at home.  He is also on Jardiance  at home.   -Continue Semglee  40 units twice daily -Cont NovoLog  10 units 3 times daily with meals -Continue SSI-resistant scale -Discontinued Jardiance  - status post IV fluids  -Further adjustment as appropriate.   Atrial flutter/Afib/NSVT: Rate controlled. -Continue Toprol  and Tikosyn  -Continue Xarelto . -Optimize electrolytes   Temporal glioblastoma:  - Followed by oncology and radiation oncology. -Continue Keppra  and Decadron  -Chemo on hold until AKA heals.   Coronary artery disease: Stable.  No chest pain. -Continue Lipitor , Toprol -XL, benazepril  and Xarelto    Essential hypertension: Normotensive for most part. -Continue Toprol  XL -Continue home benazepril  at half home dose   Chronic kidney disease 3a  - Cr 1.44 < 1.33, baseline 0.9-1.5 - AKI ruled out after careful chart review  - status post IV fluids on 12/9 - avoid  nephrotoxic agents - renal dose meds  - monitor BMP as needed    OSA on CPAP -Refusing CPAP in the hospital.   Leukocytosis:  - Mild. Likely due to steroid and stress from surgery. - monitor clinically   Normocytic anemia: Stable after initial drop -Continue monitoring   Hyperlipidemia -Continue lipitor  80 mg po q day   Fall: Per wife, he went down on the floor when he tried to go to the bathroom the afternoon of 12/5.  No prodromes.  No apparent injury or focal deficit. -Fall precaution -PT/OT as above   Class I obesity Body mass index is 33.89 kg/m.  DVT prophylaxis: SCDs Start: 03/08/24 0006 rivaroxaban  (XARELTO ) tablet 20 mg  SCDs   Code Status: Full Code Family Communication: updated son at bedside Disposition Plan: Inpatient rehab Reason for continuing need for hospitalization: medically ready, pending insurance auth for inpatient rehab  Objective: Vitals:   03/12/24 2317 03/13/24 0342 03/13/24 0751 03/13/24 1207  BP: (!) 154/70 (!) 166/78 138/67 129/79  Pulse: 61 60 70 77  Resp: 20 20 16 16   Temp: 98.2 F (36.8 C) 98.5 F (36.9 C) 98 F (36.7 C) 99.1 F (37.3 C)  TempSrc: Oral Oral Oral Oral  SpO2: 96% 98% 99% 99%  Weight:      Height:        Intake/Output Summary (Last 24 hours) at 03/13/2024 1421 Last data filed at 03/13/2024 0335 Gross per 24 hour  Intake 1181.11 ml  Output 650 ml  Net 531.11 ml   Filed Weights   03/07/24 1320  Weight: 110.2 kg    Examination:  Physical Exam Vitals and nursing note reviewed.  Constitutional:      General: He is not in acute distress.    Appearance: He is ill-appearing.     Comments: Weak, frail  HENT:     Head: Normocephalic and atraumatic.  Cardiovascular:     Rate and Rhythm: Normal rate and regular rhythm.     Pulses: Normal pulses.     Heart sounds: Normal heart sounds.  Pulmonary:     Effort: Pulmonary effort is normal.     Breath sounds: Normal breath sounds.  Abdominal:     General:  Bowel sounds are normal.     Palpations: Abdomen is soft.  Neurological:     Mental Status: He is alert. Mental status is at baseline.     Data Reviewed: I have personally reviewed following labs and imaging studies  CBC: Recent Labs  Lab 03/09/24 0311 03/10/24 0326 03/11/24 0350 03/12/24 0341 03/13/24 0301  WBC 14.6* 14.6* 13.7* 14.7* 13.1*  HGB 10.1* 9.8* 9.8* 10.8* 10.2*  HCT 30.6* 29.8* 29.7* 33.1* 31.1*  MCV 94.2 93.4 93.1 95.1 95.4  PLT 364 368 398 490* 492*   Basic Metabolic Panel: Recent Labs  Lab 03/08/24 0254 03/09/24 0311 03/10/24 0326 03/11/24 0350 03/12/24 0341 03/12/24 0813 03/13/24 0301  NA 137   < > 133* 135 134* 135 135  K 4.1   < > 3.9 4.2 4.2 4.6 3.9  CL 102   < > 102 101 102 102 102  CO2 26   < > 23 21* 15* 22 25  GLUCOSE 200*   < > 266* 312* 316* 276* 181*  BUN 23   < > 21 27* 31* 31* 29*  CREATININE 1.19   < > 1.06 1.05 1.31* 1.33* 1.44*  CALCIUM  8.3*   < > 8.3* 8.7* 8.5* 8.6* 8.3*  MG 1.9  --   --  1.8 2.0  --  1.9  PHOS 3.6  --   --  3.6 4.6 4.8* 4.5   < > = values in this interval not displayed.   GFR: Estimated Creatinine Clearance: 61.2 mL/min (A) (by C-G formula based on SCr of 1.44 mg/dL (H)). Liver Function Tests: Recent Labs  Lab 03/07/24 1441 03/08/24 0254 03/11/24 0350 03/12/24 0341 03/12/24 0813 03/13/24 0301  AST 24 19  --   --   --   --   ALT 18 18  --   --   --   --   ALKPHOS 110 90  --   --   --   --   BILITOT 1.6* 0.7  --   --   --   --   PROT 6.2* 6.0*  --   --   --   --   ALBUMIN  2.4* 2.2* 2.0* 2.2* 2.5* 2.1*   No results for input(s): LIPASE, AMYLASE in the last 168 hours. No results for input(s): AMMONIA in the last 168 hours. Coagulation Profile: Recent Labs  Lab 03/07/24 1405  INR 1.0   Cardiac Enzymes: No results for input(s): CKTOTAL, CKMB, CKMBINDEX, TROPONINI in the last 168 hours. ProBNP, BNP (last 5 results) No results for input(s): PROBNP, BNP in the last 8760  hours. HbA1C: No results for input(s): HGBA1C in the last 72 hours. CBG: Recent Labs  Lab 03/12/24 1321 03/12/24 1623 03/12/24 2103 03/13/24 0618 03/13/24 1206  GLUCAP 218* 233* 243* 122* 121*   Lipid Profile: No results for input(s): CHOL, HDL, LDLCALC, TRIG, CHOLHDL, LDLDIRECT in the last 72 hours. Thyroid  Function Tests: No results for input(s):  TSH, T4TOTAL, FREET4, T3FREE, THYROIDAB in the last 72 hours. Anemia Panel: No results for input(s): VITAMINB12, FOLATE, FERRITIN, TIBC, IRON, RETICCTPCT in the last 72 hours. Sepsis Labs: No results for input(s): PROCALCITON, LATICACIDVEN in the last 168 hours.  No results found for this or any previous visit (from the past 240 hours).   Radiology Studies: No results found.  Scheduled Meds:  atorvastatin   80 mg Oral QHS   benazepril   20 mg Oral Daily   dexamethasone   2 mg Oral Daily   dofetilide   500 mcg Oral BID   ezetimibe   10 mg Oral Daily   insulin  aspart  0-20 Units Subcutaneous TID WC   insulin  aspart  0-5 Units Subcutaneous QHS   insulin  aspart  10 Units Subcutaneous TID WC   insulin  glargine  45 Units Subcutaneous BID   levETIRAcetam   500 mg Oral BID   metoprolol  succinate  25 mg Oral Daily   pantoprazole   40 mg Oral Daily   rivaroxaban   20 mg Oral Q supper   Continuous Infusions:   LOS: 6 days   Norval Bar, MD  [facility]  03/13/2024, 2:21 PM

## 2024-03-13 NOTE — H&P (Shared)
 Physical Medicine and Rehabilitation Admission H&P    No chief complaint on file. : HPI: ***  ROS Past Medical History:  Diagnosis Date   AKI (acute kidney injury) 03/13/2020   Atrial flutter (HCC)    Coronary artery disease 08/2011   a. NSTEMI 08/2011, LHC w/ severe disease of LCx with good collaterals, lesion was high-risk for intervention due to a steep angulation of the LCx coming from the left main coronary artery as well as heavy calcifications, discused at cath conference w/ consensus advising med Rx; b. 03/2020 Cath: LM nl, LAD 77m, LCX 100p w/ R->L and L->L collats. RCA dominant, nl. EF 55%. PA 33/6/19, PCWP 8.   GERD (gastroesophageal reflux disease)    H/O brain tumor 01/2024   History of echocardiogram    a. 01/2020 Echo: EF 60-65%, no rwma. Sev elev PASP. Mod dil LA.   Hyperlipidemia    Hypertension    Myocardial infarction [facility])    Paroxysmal atrial fibrillation (HCC)    a. s/p ablation 01/2013; b CHADS2VASc => 3 (HTN, DM, vascular disease); c. on Coumadin ; d. 12/2019 Amio d/c'd 2/2 hyperthyroidism.   Poorly controlled diabetes mellitus (HCC)    RBBB    Sleep apnea    uses nightly   Past Surgical History:  Procedure Laterality Date   ABLATION  01/29/2013   PVI and CTI by Dr Kelsie for atrial flutter and paroxysmal atrial fibrillation   AMPUTATION Left 03/07/2024   Procedure: AMPUTATION, ABOVE KNEE;  Surgeon: Magda Debby SAILOR, MD;  Location: [facility] OR;  Service: Vascular;  Laterality: Left;   ANTERIOR CERVICAL DECOMP/DISCECTOMY FUSION N/A 04/18/2018   Procedure: ANTERIOR CERVICAL DECOMPRESSION/DISCECTOMY FUSION 2 LEVELS C5-7;  Surgeon: Clois Fret, MD;  Location: [facility] ORS;  Service: Neurosurgery;  Laterality: N/A;   APPLICATION OF CRANIAL NAVIGATION Left 01/29/2024   Procedure: COMPUTER-ASSISTED NAVIGATION, FOR CRANIAL PROCEDURE;  Surgeon: Rosslyn Dino HERO, MD;  Location: [facility] OR;  Service: Neurosurgery;  Laterality: Left;   APPLICATION OF WOUND VAC Left  03/07/2024   Procedure: APPLICATION, WOUND VAC;  Surgeon: Magda Debby SAILOR, MD;  Location: [facility] OR;  Service: Vascular;  Laterality: Left;   ATRIAL FIBRILLATION ABLATION N/A 01/29/2013   Procedure: ATRIAL FIBRILLATION ABLATION;  Surgeon: Lynwood JONETTA Kelsie, MD;  Location: [facility] CATH LAB;  Service: Cardiovascular;  Laterality: N/A;   ATRIAL FIBRILLATION ABLATION N/A 11/15/2021   Procedure: ATRIAL FIBRILLATION ABLATION;  Surgeon: Cindie Ole DASEN, MD;  Location: [facility] INVASIVE CV LAB;  Service: Cardiovascular;  Laterality: N/A;   CARDIAC CATHETERIZATION  08/03/2011   COLONOSCOPY WITH PROPOFOL  N/A 02/02/2016   Procedure: COLONOSCOPY WITH PROPOFOL ;  Surgeon: Rogelia Copping, MD;  Location: [facility] ENDOSCOPY;  Service: Endoscopy;  Laterality: N/A;   COLONOSCOPY WITH PROPOFOL  N/A 04/20/2023   Procedure: COLONOSCOPY WITH PROPOFOL ;  Surgeon: Copping Rogelia, MD;  Location: [facility] ENDOSCOPY;  Service: Endoscopy;  Laterality: N/A;   CRANIOTOMY Left 01/29/2024   Procedure: CRANIOTOMY TUMOR EXCISION;  Surgeon: Rosslyn Dino HERO, MD;  Location: [facility] OR;  Service: Neurosurgery;  Laterality: Left;  LEFT TEMPORAL MASS RESCETION   ENDARTERECTOMY FEMORAL Left 02/09/2024   Procedure: LEFT FEMORAL ENDARTERECTOMY;  Surgeon: Magda Debby SAILOR, MD;  Location: [facility] OR;  Service: Vascular;  Laterality: Left;   FASCIECTOMY, LOWER EXTREMITY Left 02/09/2024   Procedure: 4 COMPARTMENT FASCIECTOMY, LEFT LOWER EXTREMITY;  Surgeon: Magda Debby SAILOR, MD;  Location: [facility] OR;  Service: Vascular;  Laterality: Left;   LOWER EXTREMITY ANGIOGRAM Left 02/09/2024   Procedure: INTRAOPERATIVE LEFT LOWER EXTREMITY ANGIOGRAM;  Surgeon:  Magda Debby SAILOR, MD;  Location: [facility] OR;  Service: Vascular;  Laterality: Left;   LUMBAR LAMINECTOMY/DECOMPRESSION MICRODISCECTOMY N/A 06/07/2017   Procedure: LUMBAR LAMINECTOMY/DECOMPRESSION MICRODISCECTOMY 2 LEVELS-L3-4,L4-5;  Surgeon: Clois Fret, MD;  Location: [facility] ORS;  Service: Neurosurgery;  Laterality: N/A;   PATCH ANGIOPLASTY   02/09/2024   Procedure: ANGIOPLASTY LEFT FEMORAL ARTERY USING XENOSURE BOVINE PERICARDIUM PATCH;  Surgeon: Magda Debby SAILOR, MD;  Location: [facility] OR;  Service: Vascular;;   POLYPECTOMY  04/20/2023   Procedure: POLYPECTOMY;  Surgeon: Jinny Carmine, MD;  Location: [facility] ENDOSCOPY;  Service: Endoscopy;;   RIGHT/LEFT HEART CATH AND CORONARY ANGIOGRAPHY N/A 03/06/2020   Procedure: RIGHT/LEFT HEART CATH AND CORONARY ANGIOGRAPHY;  Surgeon: Perla Evalene PARAS, MD;  Location: [facility] INVASIVE CV LAB;  Service: Cardiovascular;  Laterality: N/A;   TEE WITHOUT CARDIOVERSION N/A 01/28/2013   Procedure: TRANSESOPHAGEAL ECHOCARDIOGRAM (TEE);  Surgeon: Redell GORMAN Shallow, MD;  Location: [facility] ENDOSCOPY;  Service: Cardiovascular;  Laterality: N/A;   THROMBECTOMY OF BYPASS GRAFT FEMORAL- PERONEAL ARTERY Left 02/09/2024   Procedure: THROMBECTOMY OF LEFT FEMORAL AND POTERIOR TIBIAL  ARTERY;  Surgeon: Magda Debby SAILOR, MD;  Location: [facility] OR;  Service: Vascular;  Laterality: Left;   Family History  Problem Relation Age of Onset   Stroke Mother    Heart disease Mother        pacemaker   Heart attack Father 64   Throat cancer Father        throat   Heart disease Paternal Grandfather        MI   Diabetes Son    Heart attack Maternal Uncle    Social History:  reports that he quit smoking about 16 years ago. His smoking use included cigarettes. He started smoking about 51 years ago. He has a 52.5 pack-year smoking history. He has never used smokeless tobacco. He reports that he does not currently use alcohol. He reports that he does not use drugs. Allergies: No Known Allergies Medications Prior to Admission  Medication Sig Dispense Refill   acetaminophen  (TYLENOL ) 500 MG tablet Take 500 mg by mouth 4 (four) times daily as needed for mild pain or moderate pain.     atorvastatin  (LIPITOR ) 80 MG tablet Take 80 mg by mouth at bedtime.     benazepril  (LOTENSIN ) 40 MG tablet TAKE 1 TABLET BY MOUTH ONCE DAILY (Patient taking differently:  Take 40 mg by mouth daily.) 90 tablet 3   cholecalciferol  (VITAMIN D3) 25 MCG (1000 UNIT) tablet Take 500 Units by mouth daily.     dexamethasone  (DECADRON ) 2 MG tablet Take 1 tablet (2 mg total) by mouth daily. 30 tablet 0   dofetilide  (TIKOSYN ) 500 MCG capsule TAKE 1 CAPSULE BY MOUTH TWICE DAILY 180 capsule 2   ezetimibe  (ZETIA ) 10 MG tablet TAKE 1 TABLET BY MOUTH ONCE DAILY 90 tablet 3   furosemide  (LASIX ) 20 MG tablet TAKE 1 TABLET BY MOUTH TWICE DAILY AS NEEDED (Patient taking differently: Take 20 mg by mouth daily.) 180 tablet 0   insulin  lispro (HUMALOG ) 100 UNIT/ML KiwkPen Inject 16 Units into the skin 3 (three) times daily. Sliding Scale     JARDIANCE  25 MG TABS tablet Take 12.5 mg by mouth daily.     LANTUS  SOLOSTAR 100 UNIT/ML Solostar Pen Inject 25 Units into the skin 2 (two) times daily.     levETIRAcetam  (KEPPRA ) 500 MG tablet Take 1 tablet (500 mg total) by mouth 2 (two) times daily. 60 tablet 0   metoprolol  succinate (TOPROL -XL) 25 MG 24 hr  tablet TAKE 1 TABLET BY MOUTH ONCE DAILY 90 tablet 1   nitroGLYCERIN  (NITROSTAT ) 0.4 MG SL tablet Place 1 tablet (0.4 mg total) under the tongue every 5 (five) minutes as needed for chest pain. 25 tablet 1   OVER THE COUNTER MEDICATION Take 1 capsule by mouth daily. Omega XL     oxyCODONE -acetaminophen  (PERCOCET/ROXICET) 5-325 MG tablet Take 1 tablet by mouth every 4 (four) hours as needed for severe pain (pain score 7-10). 20 tablet 0   pantoprazole  (PROTONIX ) 40 MG tablet Take 1 tablet (40 mg total) by mouth daily. Stop taking if no longer on decadron . 30 tablet 3   rivaroxaban  (XARELTO ) 20 MG TABS tablet Take 1 tablet (20 mg total) by mouth daily with supper. 30 tablet 3   Continuous Blood Gluc Sensor (FREESTYLE LIBRE 14 DAY SENSOR) MISC SMARTSIG:1 Kit(s) Topical Every 2 Weeks     INSULIN  SYRINGE 1CC/29G 29G X 1/2 1 ML MISC USE AS DIRECTED 100 each PRN   ondansetron  (ZOFRAN ) 8 MG tablet Take 1 tablet (8 mg total) by mouth every 8 (eight)  hours as needed for nausea or vomiting. May take 30-60 minutes prior to Temodar  administration if nausea/vomiting occurs as needed. (Patient not taking: Reported on 03/06/2024) 30 tablet 1   ONE TOUCH ULTRA TEST test strip TEST THREE TIMES A DAY AS DIRECTED. 100 each 12      Home: Home Living Family/patient expects to be discharged to:: Private residence Living Arrangements: Spouse/significant other, Children Available Help at Discharge: Family, Available 24 hours/day Type of Home: House Home Access: Stairs to enter, Ramped entrance (Ramp is in progress) Entrance Stairs-Number of Steps: 2 Entrance Stairs-Rails: Right, Left Home Layout: One level Bathroom Shower/Tub: Health Visitor: Handicapped height Bathroom Accessibility: Yes Home Equipment: Information systems manager - built in, [place] - single point, Crutches, Firefighter, Agricultural Consultant (2 wheels), BSC/3in1   Functional History: Prior Function Prior Level of Function : Needs assist Physical Assist : Mobility (physical) Mobility (physical): Transfers, Gait Mobility Comments: prior to hospitalization pt was requiring some assistance with RW due to pain in the LLE ADLs Comments: Assist from family with LB ADLs  Functional Status:  Mobility: Bed Mobility Overal bed mobility: Needs Assistance Bed Mobility: Supine to Sit Supine to sit: HOB elevated, Used rails, Supervision Sit to supine: Contact guard assist General bed mobility comments: supervision for safety able to use bed rail to pull Transfers Overall transfer level: Needs assistance Equipment used: Rolling walker (2 wheels), None Transfers: Sit to/from Stand, Bed to chair/wheelchair/BSC Sit to Stand: Min assist Bed to/from chair/wheelchair/BSC transfer type:: Lateral/scoot transfer Stand pivot transfers: Contact guard assist, Mod assist Step pivot transfers:  (pt unable to advance foot with poor use of UEs on RW)  Lateral/Scoot Transfers: Supervision General  transfer comment: pt needs cuing for correct hand placement for power up and steadying in RW. supervision for lateral scooting along EoB and for transfer from bed to chair Ambulation/Gait General Gait Details: unable Pre-gait activities: Standing with RW, attempting heel raises with poor clearance, working on rotation of foot on heel R/L to prep for being able to the kroger heel/toe on RLE to pivot transfer    ADL: ADL Overall ADL's : Needs assistance/impaired Eating/Feeding: Set up, Sitting Grooming: Set up, Sitting Upper Body Bathing: Set up, Sitting Lower Body Bathing: Minimal assistance, Sitting/lateral leans Upper Body Dressing : Set up, Sitting Lower Body Dressing: Sitting/lateral leans, Sit to/from stand, Minimal assistance Lower Body Dressing Details (indicate cue type and reason): does  best in recline attempted in bed and was unable to complete Toilet Transfer: Stand-pivot, Moderate assistance, Rolling walker (2 wheels) Functional mobility during ADLs: Moderate assistance, Rolling walker (2 wheels) General ADL Comments: Educated pt and wife on use of drop arm bariatric BSC for lateral scoots and other compensatory techniques  Cognition: Cognition Orientation Level: Oriented X4 Cognition Arousal: Alert Behavior During Therapy: WFL for tasks assessed/performed  Physical Exam: Blood pressure 129/79, pulse 77, temperature 99.1 F (37.3 C), temperature source Oral, resp. rate 16, height 5' 11 (1.803 m), weight 110.2 kg, SpO2 99%. Physical Exam  Results for orders placed or performed during the hospital encounter of 03/07/24 (from the past 48 hours)  Glucose, capillary     Status: Abnormal   Collection Time: 03/11/24  4:28 PM  Result Value Ref Range   Glucose-Capillary 211 (H) 70 - 99 mg/dL    Comment: Glucose reference range applies only to samples taken after fasting for at least 8 hours.  Glucose, capillary     Status: Abnormal   Collection Time: 03/11/24  9:20 PM  Result  Value Ref Range   Glucose-Capillary 201 (H) 70 - 99 mg/dL    Comment: Glucose reference range applies only to samples taken after fasting for at least 8 hours.  Renal function panel     Status: Abnormal   Collection Time: 03/12/24  3:41 AM  Result Value Ref Range   Sodium 134 (L) 135 - 145 mmol/L   Potassium 4.2 3.5 - 5.1 mmol/L   Chloride 102 98 - 111 mmol/L   CO2 15 (L) 22 - 32 mmol/L   Glucose, Bld 316 (H) 70 - 99 mg/dL    Comment: Glucose reference range applies only to samples taken after fasting for at least 8 hours.   BUN 31 (H) 8 - 23 mg/dL   Creatinine, Ser 8.68 (H) 0.61 - 1.24 mg/dL   Calcium  8.5 (L) 8.9 - 10.3 mg/dL   Phosphorus 4.6 2.5 - 4.6 mg/dL   Albumin  2.2 (L) 3.5 - 5.0 g/dL   GFR, Estimated 59 (L) >60 mL/min    Comment: (NOTE) Calculated using the CKD-EPI Creatinine Equation (2021)    Anion gap 17 (H) 5 - 15    Comment: Performed at [facility] Lab, [address], [place], [place] [address]  Magnesium      Status: None   Collection Time: 03/12/24  3:41 AM  Result Value Ref Range   Magnesium  2.0 1.7 - 2.4 mg/dL    Comment: Performed at [facility] Lab, [address], [place], [place] [address]  CBC     Status: Abnormal   Collection Time: 03/12/24  3:41 AM  Result Value Ref Range   WBC 14.7 (H) 4.0 - 10.5 K/uL   RBC 3.48 (L) 4.22 - 5.81 MIL/uL   Hemoglobin 10.8 (L) 13.0 - 17.0 g/dL   HCT 66.8 (L) 60.9 - 47.9 %   MCV 95.1 80.0 - 100.0 fL   MCH 31.0 26.0 - 34.0 pg   MCHC 32.6 30.0 - 36.0 g/dL   RDW 85.3 88.4 - 84.4 %   Platelets 490 (H) 150 - 400 K/uL   nRBC 0.0 0.0 - 0.2 %    Comment: Performed at [facility] Lab, [address], [place], [place] [address]  Glucose, capillary     Status: Abnormal   Collection Time: 03/12/24  6:09 AM  Result Value Ref Range   Glucose-Capillary 336 (H) 70 - 99 mg/dL    Comment:  Glucose reference range applies only to samples taken after fasting for at least 8 hours.  Renal function panel     Status: Abnormal    Collection Time: 03/12/24  8:13 AM  Result Value Ref Range   Sodium 135 135 - 145 mmol/L   Potassium 4.6 3.5 - 5.1 mmol/L   Chloride 102 98 - 111 mmol/L   CO2 22 22 - 32 mmol/L   Glucose, Bld 276 (H) 70 - 99 mg/dL    Comment: Glucose reference range applies only to samples taken after fasting for at least 8 hours.   BUN 31 (H) 8 - 23 mg/dL   Creatinine, Ser 8.66 (H) 0.61 - 1.24 mg/dL   Calcium  8.6 (L) 8.9 - 10.3 mg/dL   Phosphorus 4.8 (H) 2.5 - 4.6 mg/dL   Albumin  2.5 (L) 3.5 - 5.0 g/dL   GFR, Estimated 58 (L) >60 mL/min    Comment: (NOTE) Calculated using the CKD-EPI Creatinine Equation (2021)    Anion gap 11 5 - 15    Comment: Performed at [facility] Lab, [address], [place], [place] [address]  Blood gas, venous     Status: Abnormal   Collection Time: 03/12/24  8:14 AM  Result Value Ref Range   pH, Ven 7.41 7.25 - 7.43   pCO2, Ven 41 (L) 44 - 60 mmHg   pO2, Ven <31 (LL) 32 - 45 mmHg    Comment: CRITICAL RESULT CALLED TO, READ BACK BY AND VERIFIED WITH: K.CLOER,RN [id number] 03/12/24 CLARK,S    Bicarbonate 26.0 20.0 - 28.0 mmol/L   Acid-Base Excess 1.2 0.0 - 2.0 mmol/L   O2 Saturation 43.4 %   Patient temperature 37.1    Drawn by BY [id number]     Comment: Performed at [facility] Lab, [address], [place], [place] [address]  Glucose, capillary     Status: Abnormal   Collection Time: 03/12/24  1:21 PM  Result Value Ref Range   Glucose-Capillary 218 (H) 70 - 99 mg/dL    Comment: Glucose reference range applies only to samples taken after fasting for at least 8 hours.  Glucose, capillary     Status: Abnormal   Collection Time: 03/12/24  4:23 PM  Result Value Ref Range   Glucose-Capillary 233 (H) 70 - 99 mg/dL    Comment: Glucose reference range applies only to samples taken after fasting for at least 8 hours.  Glucose, capillary     Status: Abnormal   Collection Time: 03/12/24  9:03 PM  Result Value Ref Range   Glucose-Capillary 243 (H) 70 - 99 mg/dL    Comment:  Glucose reference range applies only to samples taken after fasting for at least 8 hours.   Comment 1 Notify RN    Comment 2 Document in Chart   Renal function panel     Status: Abnormal   Collection Time: 03/13/24  3:01 AM  Result Value Ref Range   Sodium 135 135 - 145 mmol/L   Potassium 3.9 3.5 - 5.1 mmol/L   Chloride 102 98 - 111 mmol/L   CO2 25 22 - 32 mmol/L   Glucose, Bld 181 (H) 70 - 99 mg/dL    Comment: Glucose reference range applies only to samples taken after fasting for at least 8 hours.   BUN 29 (H) 8 - 23 mg/dL   Creatinine, Ser 8.55 (H) 0.61 - 1.24 mg/dL   Calcium  8.3 (L) 8.9 - 10.3 mg/dL   Phosphorus 4.5 2.5 - 4.6  mg/dL   Albumin  2.1 (L) 3.5 - 5.0 g/dL   GFR, Estimated 53 (L) >60 mL/min    Comment: (NOTE) Calculated using the CKD-EPI Creatinine Equation (2021)    Anion gap 8 5 - 15    Comment: Performed at [facility] Lab, [address] [place], [place] [address]  Magnesium      Status: None   Collection Time: 03/13/24  3:01 AM  Result Value Ref Range   Magnesium  1.9 1.7 - 2.4 mg/dL    Comment: Performed at [facility] Lab, [address], [place], [place] [address]  CBC     Status: Abnormal   Collection Time: 03/13/24  3:01 AM  Result Value Ref Range   WBC 13.1 (H) 4.0 - 10.5 K/uL   RBC 3.26 (L) 4.22 - 5.81 MIL/uL   Hemoglobin 10.2 (L) 13.0 - 17.0 g/dL   HCT 68.8 (L) 60.9 - 47.9 %   MCV 95.4 80.0 - 100.0 fL   MCH 31.3 26.0 - 34.0 pg   MCHC 32.8 30.0 - 36.0 g/dL   RDW 85.3 88.4 - 84.4 %   Platelets 492 (H) 150 - 400 K/uL   nRBC 0.0 0.0 - 0.2 %    Comment: Performed at [facility] Lab, [address] [place], [place] [address]  Glucose, capillary     Status: Abnormal   Collection Time: 03/13/24  6:18 AM  Result Value Ref Range   Glucose-Capillary 122 (H) 70 - 99 mg/dL    Comment: Glucose reference range applies only to samples taken after fasting for at least 8 hours.   Comment 1 Notify RN    Comment 2 Document in Chart   Glucose, capillary      Status: Abnormal   Collection Time: 03/13/24 12:06 PM  Result Value Ref Range   Glucose-Capillary 121 (H) 70 - 99 mg/dL    Comment: Glucose reference range applies only to samples taken after fasting for at least 8 hours.   No results found.    Blood pressure 129/79, pulse 77, temperature 99.1 F (37.3 C), temperature source Oral, resp. rate 16, height 5' 11 (1.803 m), weight 110.2 kg, SpO2 99%.  Medical Problem List and Plan: 1. Functional deficits secondary to ***  -patient may *** shower  -ELOS/Goals: *** 2.  Antithrombotics: -DVT/anticoagulation:  {VTE PROPHYLAXIS/ANTICOAGULATION - [id number]:[id number]}  -antiplatelet therapy: *** 3. Pain Management: *** 4. Mood/Behavior/Sleep: ***  -antipsychotic agents: *** 5. Neuropsych/cognition: This patient *** capable of making decisions on *** own behalf. 6. Skin/Wound Care: *** 7. Fluids/Electrolytes/Nutrition: ***     ***  Sharlet GORMAN Schmitz, PA-C 03/13/2024

## 2024-03-13 NOTE — Progress Notes (Signed)
 Physical Therapy Treatment Patient Details Name: Douglas Hunter MRN: [id number] DOB: February 25, 1956 Today's Date: 03/13/2024   History of Present Illness 68 yo male presenting with L foot pain. 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 PT outlined plan for session to work on increased ability to off weight his R LE to improve upright mobility. Pt is supervision for bed mobility and contact guard for sit to stand transfers. PT provided multimodal cuing and demonstration for use of triceps muscles to push down on RW and pt unable to replicate, however with transferring from bed to chair pt exhibits correct sequencing of triceps to be able to scoot hips. Pt unable to replicate with RW. Pt very emotionally labile about loss of limb and reports he is looking forward to his prosthesis to be able to move like he used to. PT provided increased education to patient and family that pt needs to work now to be strong enough to use prosthetic when time comes. Pt voices understanding.     If plan is discharge home, recommend the following: Help with stairs or ramp for entrance;Assist for transportation;Assistance with cooking/housework;Two people to help with walking and/or transfers;Direct supervision/assist for medications management;Direct supervision/assist for financial management;Supervision due to cognitive status   Can travel by private vehicle      Yes  Equipment Recommendations  Wheelchair cushion (measurements PT);Wheelchair (measurements PT);Hoyer lift;Hospital bed (20 inches (per TOC note cannot get because recently got a walker??))       Precautions / Restrictions Precautions Precautions: Fall Recall of Precautions/Restrictions: Impaired Precaution/Restrictions Comments: attempted  transfer with son 12/5 with near fall Restrictions Weight Bearing Restrictions Per Provider Order: Yes LLE Weight Bearing Per Provider Order: Non weight bearing Other Position/Activity Restrictions: AKA     Mobility  Bed Mobility Overal bed mobility: Needs Assistance Bed Mobility: Supine to Sit     Supine to sit: HOB elevated, Used rails, Supervision     General bed mobility comments: supervision for safety able to use bed rail to pull    Transfers Overall transfer level: Needs assistance Equipment used: Rolling walker (2 wheels), None Transfers: Sit to/from Stand, Bed to chair/wheelchair/BSC Sit to Stand: Min assist          Lateral/Scoot Transfers: Supervision General transfer comment: pt needs cuing for correct hand placement for power up and steadying in RW. supervision for lateral scooting along EoB and for transfer from bed to chair    Ambulation/Gait               General Gait Details: unable       Balance Overall balance assessment: Needs assistance Sitting-balance support: Feet supported Sitting balance-[name] Scale: Good     Standing balance support: Bilateral upper extremity supported, Reliant on assistive device for balance, During functional activity Standing balance-[name] Scale: Poor Standing balance comment: Reliant on RW                            Communication Communication Communication: Impaired Factors Affecting Communication: Difficulty expressing self (emontionally labile)  Cognition Arousal: Alert Behavior During Therapy: WFL for tasks assessed/performed   PT - Cognitive impairments: Awareness, Memory, Attention, Sequencing, Problem solving, Safety/Judgement  PT - Cognition Comments: poor translation of instructions into actions, despite multimodal cuing and demonstration Following commands: Impaired Following commands impaired: Only follows one step commands consistently    Cueing  Cueing Techniques: Verbal cues, Visual cues, Gestural cues, Tactile cues  Exercises Other Exercises Other Exercises: attempted triceps extensions in RW to be able to off weight R LE, PT provided multiple forms of education to keep arms by his hips, keep an upright posture and to not just raise his shoulders. Pt unable to carryover, however with pt scooting along the EoB able to push up significantly to offweight hips, pt unable to provide carryover between activities.    General Comments General comments (skin integrity, edema, etc.): son and later spouse present during session, VSS on RA      Pertinent Vitals/Pain Pain Assessment Pain Assessment: No/denies pain     PT Goals (current goals can now be found in the care plan section) Acute Rehab PT Goals Patient Stated Goal: to return home safety with family and start radiation PT Goal Formulation: With patient/family Time For Goal Achievement: 03/22/24 Progress towards PT goals: Progressing toward goals    Frequency    Min 3X/week       AM-PAC PT 6 Clicks Mobility   Outcome Measure  Help needed turning from your back to your side while in a flat bed without using bedrails?: A Little Help needed moving from lying on your back to sitting on the side of a flat bed without using bedrails?: A Little Help needed moving to and from a bed to a chair (including a wheelchair)?: Total Help needed standing up from a chair using your arms (e.g., wheelchair or bedside chair)?: A Lot Help needed to walk in hospital room?: Total Help needed climbing 3-5 steps with a railing? : Total 6 Click Score: 11    End of Session Equipment Utilized During Treatment: Gait belt Activity Tolerance: Patient tolerated treatment well Patient left: with call bell/phone within reach;in chair;with chair alarm set;with family/visitor present Nurse Communication: Mobility status PT Visit Diagnosis: Unsteadiness on feet (R26.81);Other abnormalities of gait and  mobility (R26.89);Muscle weakness (generalized) (M62.81)     Time: 8941-8865 PT Time Calculation (min) (ACUTE ONLY): 36 min  Charges:    $Therapeutic Exercise: 8-22 mins $Therapeutic Activity: 8-22 mins PT General Charges $$ ACUTE PT VISIT: 1 Visit                     Lindsey Hommel B. Fleeta Lapidus PT, DPT Acute Rehabilitation Services Please use secure chat or  Call Office [phone]    Douglas Hunter 03/13/2024, 12:01 PM

## 2024-03-13 NOTE — Progress Notes (Signed)
 Inpatient Rehab Admissions Coordinator:   Awaiting insurance determination from [facility] Medicare.  Will follow.   Reche Lowers, PT, DPT Admissions Coordinator [phone] 03/13/24 9:21 AM

## 2024-03-14 ENCOUNTER — Other Ambulatory Visit (HOSPITAL_COMMUNITY): Payer: Self-pay

## 2024-03-14 LAB — GLUCOSE, CAPILLARY
Glucose-Capillary: 183 mg/dL — ABNORMAL HIGH (ref 70–99)
Glucose-Capillary: 207 mg/dL — ABNORMAL HIGH (ref 70–99)
Glucose-Capillary: 273 mg/dL — ABNORMAL HIGH (ref 70–99)
Glucose-Capillary: 225 mg/dL — ABNORMAL HIGH (ref 70–99)

## 2024-03-14 NOTE — Progress Notes (Signed)
°  Progress Note    03/14/2024 7:41 AM 7 Days Post-Op  Subjective: Says he is feeling good, denies any pain at his amputation site    Vitals:   03/13/24 2329 03/14/24 0325  BP: (!) 175/69 (!) 162/77  Pulse: 95 66  Resp: 20 20  Temp: 98.1 F (36.7 C) 98.1 F (36.7 C)  SpO2: 94% 97%    Physical Exam: General: Resting comfortably in bed Cardiac: Regular Lungs: Nonlabored Incisions: VAC dressing taken off left AKA.  Left AKA incision looks great without dehiscence.  Minimal bloody drainage  CBC    Component Value Date/Time   WBC 13.1 (H) 03/13/2024 0301   RBC 3.26 (L) 03/13/2024 0301   HGB 10.2 (L) 03/13/2024 0301   HGB 13.8 06/28/2023 0946   HCT 31.1 (L) 03/13/2024 0301   HCT 41.7 06/28/2023 0946   PLT 492 (H) 03/13/2024 0301   PLT 232 06/28/2023 0946   MCV 95.4 03/13/2024 0301   MCV 93 06/28/2023 0946   MCV 95 03/02/2014 1416   MCH 31.3 03/13/2024 0301   MCHC 32.8 03/13/2024 0301   RDW 14.6 03/13/2024 0301   RDW 12.3 06/28/2023 0946   RDW 12.8 03/02/2014 1416   LYMPHSABS 0.9 02/09/2024 1347   LYMPHSABS 2.0 10/26/2016 1423   LYMPHSABS 1.5 03/02/2014 1416   MONOABS 0.6 02/09/2024 1347   MONOABS 1.3 (H) 03/02/2014 1416   EOSABS 0.0 02/09/2024 1347   EOSABS 0.3 10/26/2016 1423   EOSABS 0.3 03/02/2014 1416   BASOSABS 0.0 02/09/2024 1347   BASOSABS 0.0 10/26/2016 1423   BASOSABS 0.1 03/02/2014 1416    BMET    Component Value Date/Time   NA 135 03/13/2024 0301   NA 140 06/28/2023 0946   NA 131 (L) 03/02/2014 1416   K 3.9 03/13/2024 0301   K 4.1 03/02/2014 1416   CL 102 03/13/2024 0301   CL 98 03/02/2014 1416   CO2 25 03/13/2024 0301   CO2 24 03/02/2014 1416   GLUCOSE 181 (H) 03/13/2024 0301   GLUCOSE 374 (H) 03/02/2014 1416   BUN 29 (H) 03/13/2024 0301   BUN 26 06/28/2023 0946   BUN 18 03/02/2014 1416   CREATININE 1.44 (H) 03/13/2024 0301   CREATININE 1.27 03/02/2014 1416   CALCIUM  8.3 (L) 03/13/2024 0301   CALCIUM  8.7 03/02/2014 1416   GFRNONAA  53 (L) 03/13/2024 0301   GFRNONAA >60 03/02/2014 1416   GFRNONAA >60 01/12/2013 0013   GFRAA 53 (L) 02/20/2020 0925   GFRAA >60 03/02/2014 1416   GFRAA >60 01/12/2013 0013    INR    Component Value Date/Time   INR 1.0 03/07/2024 1405   INR 1.1 01/11/2013 1615     Intake/Output Summary (Last 24 hours) at 03/14/2024 0741 Last data filed at 03/14/2024 9361 Gross per 24 hour  Intake 0 ml  Output 300 ml  Net -300 ml      Assessment/Plan:  68 y.o. male is 7 days postop, s/p: Left AKA   - He is doing great this morning and denies any pain at his amputation site -I have removed the incisional VAC off of his amputation.  His incision looks great without concern for hematoma or infection. -He is pending CIR placement -Stable for discharge to rehab from vascular perspective.  He has a follow-up scheduled with our office for January 6th for staple removal   Ahmed Holster, [facility] [phone] 03/14/2024 7:41 AM

## 2024-03-14 NOTE — Progress Notes (Addendum)
 Mobility Specialist Progress Note;   03/14/24 0916  Mobility  Activity Pivoted/transferred from bed to chair  Level of Assistance Contact guard assist, steadying assist  Assistive Device None  LLE Weight Bearing Per Provider Order NWB  Activity Response Tolerated well  Mobility Referral Yes  Mobility visit 1 Mobility  Mobility Specialist Start Time (ACUTE ONLY) [id number]  Mobility Specialist Stop Time (ACUTE ONLY) 0931  Mobility Specialist Time Calculation (min) (ACUTE ONLY) 15 min   Pt eager for OOB mobility. Required light MinG assistance to lateral scoot from bed to chair. No c/o when asked. Pt requested to ride around unit in chair to get out of room, pt enjoyed this. Returned pt to room and left in chair with all needs met. Wife present.   Lauraine Erm Mobility Specialist Please contact via SecureChat or [employer] [phone]

## 2024-03-14 NOTE — Plan of Care (Signed)
   Problem: Health Behavior/Discharge Planning: Goal: Ability to manage health-related needs will improve Outcome: Progressing

## 2024-03-14 NOTE — Progress Notes (Signed)
 PROGRESS NOTE    Douglas Hunter  FMW:[id number] DOB: 1956-02-05 DOA: 03/07/2024 PCP: [name], MD  Subjective:   No acute events overnight. Seen and examined at bedside. Tolerating oral intake without n/v. Denies constipation.   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 No major postoperative complications other than hyperglycemia. Therapy recommended CIR. medically stable for discharge.    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 -Fall precaution   IDDM-2 Hyperglycemia - improving - A1c 8.0% on 10/28.  Hyperglycemia likely due to steroid.  - Uses Lantus  25 units twice daily and Humalog  16 units 3 times daily at home.  He is also on Jardiance  at home.   -Continue Semglee  40 units twice daily -Cont NovoLog  10 units 3 times daily with meals -Continue SSI-resistant scale -Discontinued Jardiance  - status post IV fluids  -Further adjustment as appropriate.   Atrial flutter/Afib/NSVT: Rate controlled. -Continue Toprol  and Tikosyn  -Continue Xarelto . -Optimize electrolytes   Temporal glioblastoma:  - Followed by oncology and radiation oncology. -Continue Keppra  and Decadron  -Chemo on hold until AKA heals.   Coronary artery disease: Stable.  No chest pain. -Continue Lipitor , Toprol -XL, benazepril  and Xarelto    Essential hypertension: Normotensive for most part. -Continue Toprol  XL -Continue home benazepril  at half home dose   Chronic kidney disease 3a  - Cr 1.44 < 1.33, baseline 0.9-1.5 - AKI ruled out after careful chart review  - status post IV fluids on 12/9 - avoid nephrotoxic agents -  renal dose meds  - monitor BMP as needed    OSA on CPAP -Refusing CPAP in the hospital.   Leukocytosis:  - Mild. Likely due to steroid and stress from surgery. - monitor clinically   Normocytic anemia: Stable after initial drop -Continue monitoring   Hyperlipidemia -Continue lipitor  80 mg po q day   Fall: Per wife, he went down on the floor when he tried to go to the bathroom the afternoon of 12/5.  No prodromes.  No apparent injury or focal deficit. -Fall precaution -PT/OT as above   Class I obesity Body mass index is 33.89 kg/m.  DVT prophylaxis: SCDs Start: 03/08/24 0006 rivaroxaban  (XARELTO ) tablet 20 mg  SCDs   Code Status: Full Code Disposition Plan: TBD Reason for continuing need for hospitalization: medically ready, dispo plan pending, appeal for CIR pending  Objective: Vitals:   03/13/24 2329 03/14/24 0325 03/14/24 0849 03/14/24 1112  BP: (!) 175/69 (!) 162/77 (!) 149/66 131/68  Pulse: 95 66 66 69  Resp: 20 20 15 15   Temp: 98.1 F (36.7 C) 98.1 F (36.7 C) 97.8 F (36.6 C) 98.4 F (36.9 C)  TempSrc: Oral Oral Oral Oral  SpO2: 94% 97% 99% 98%  Weight:      Height:        Intake/Output Summary (Last 24 hours) at 03/14/2024 1442 Last data filed at 03/14/2024 9361 Gross per 24 hour  Intake 0 ml  Output 300 ml  Net -300 ml   Filed Weights   03/07/24 1320  Weight: 110.2 kg    Examination:  Physical Exam Vitals and nursing note reviewed.  Constitutional:      General: He  is not in acute distress.    Appearance: He is ill-appearing.     Comments: Weak, frail  HENT:     Head: Normocephalic and atraumatic.  Cardiovascular:     Rate and Rhythm: Normal rate and regular rhythm.     Pulses: Normal pulses.     Heart sounds: Normal heart sounds.  Pulmonary:     Effort: Pulmonary effort is normal.     Breath sounds: Normal breath sounds.  Abdominal:     General: Bowel sounds are normal.     Palpations: Abdomen is soft.  Musculoskeletal:      Comments: L AKA stump healing well  Neurological:     Mental Status: He is alert.     Data Reviewed: I have personally reviewed following labs and imaging studies  CBC: Recent Labs  Lab 03/09/24 0311 03/10/24 0326 03/11/24 0350 03/12/24 0341 03/13/24 0301  WBC 14.6* 14.6* 13.7* 14.7* 13.1*  HGB 10.1* 9.8* 9.8* 10.8* 10.2*  HCT 30.6* 29.8* 29.7* 33.1* 31.1*  MCV 94.2 93.4 93.1 95.1 95.4  PLT 364 368 398 490* 492*   Basic Metabolic Panel: Recent Labs  Lab 03/08/24 0254 03/09/24 0311 03/10/24 0326 03/11/24 0350 03/12/24 0341 03/12/24 0813 03/13/24 0301  NA 137   < > 133* 135 134* 135 135  K 4.1   < > 3.9 4.2 4.2 4.6 3.9  CL 102   < > 102 101 102 102 102  CO2 26   < > 23 21* 15* 22 25  GLUCOSE 200*   < > 266* 312* 316* 276* 181*  BUN 23   < > 21 27* 31* 31* 29*  CREATININE 1.19   < > 1.06 1.05 1.31* 1.33* 1.44*  CALCIUM  8.3*   < > 8.3* 8.7* 8.5* 8.6* 8.3*  MG 1.9  --   --  1.8 2.0  --  1.9  PHOS 3.6  --   --  3.6 4.6 4.8* 4.5   < > = values in this interval not displayed.   GFR: Estimated Creatinine Clearance: 61.2 mL/min (A) (by C-G formula based on SCr of 1.44 mg/dL (H)). Liver Function Tests: Recent Labs  Lab 03/08/24 0254 03/11/24 0350 03/12/24 0341 03/12/24 0813 03/13/24 0301  AST 19  --   --   --   --   ALT 18  --   --   --   --   ALKPHOS 90  --   --   --   --   BILITOT 0.7  --   --   --   --   PROT 6.0*  --   --   --   --   ALBUMIN  2.2* 2.0* 2.2* 2.5* 2.1*   No results for input(s): LIPASE, AMYLASE in the last 168 hours. No results for input(s): AMMONIA in the last 168 hours. Coagulation Profile: No results for input(s): INR, PROTIME in the last 168 hours. Cardiac Enzymes: No results for input(s): CKTOTAL, CKMB, CKMBINDEX, TROPONINI in the last 168 hours. ProBNP, BNP (last 5 results) No results for input(s): PROBNP, BNP in the last 8760 hours. HbA1C: No results for input(s): HGBA1C in the last 72 hours. CBG: Recent  Labs  Lab 03/13/24 1206 03/13/24 1609 03/13/24 2101 03/14/24 0605 03/14/24 1108  GLUCAP 121* 197* 246* 183* 207*   Lipid Profile: No results for input(s): CHOL, HDL, LDLCALC, TRIG, CHOLHDL, LDLDIRECT in the last 72 hours. Thyroid  Function Tests: No results for input(s): TSH, T4TOTAL, FREET4, T3FREE, THYROIDAB in the last 72  hours. Anemia Panel: No results for input(s): VITAMINB12, FOLATE, FERRITIN, TIBC, IRON, RETICCTPCT in the last 72 hours. Sepsis Labs: No results for input(s): PROCALCITON, LATICACIDVEN in the last 168 hours.  No results found for this or any previous visit (from the past 240 hours).   Radiology Studies: No results found.  Scheduled Meds:  atorvastatin   80 mg Oral QHS   benazepril   20 mg Oral Daily   dexamethasone   2 mg Oral Daily   dofetilide   500 mcg Oral BID   ezetimibe   10 mg Oral Daily   insulin  aspart  0-20 Units Subcutaneous TID WC   insulin  aspart  0-5 Units Subcutaneous QHS   insulin  aspart  10 Units Subcutaneous TID WC   insulin  glargine  45 Units Subcutaneous BID   levETIRAcetam   500 mg Oral BID   metoprolol  succinate  25 mg Oral Daily   pantoprazole   40 mg Oral Daily   rivaroxaban   20 mg Oral Q supper   Continuous Infusions:   LOS: 7 days   Norval Bar, MD  [facility]  03/14/2024, 2:42 PM

## 2024-03-14 NOTE — Progress Notes (Signed)
 Inpatient Rehab Admissions Coordinator:   Expedited appeal pending.  Will follow for determination.   Reche Lowers, PT, DPT Admissions Coordinator [phone] 03/14/2024 10:17 AM

## 2024-03-15 ENCOUNTER — Other Ambulatory Visit (HOSPITAL_COMMUNITY): Payer: Self-pay

## 2024-03-15 ENCOUNTER — Encounter (HOSPITAL_COMMUNITY): Payer: Self-pay | Admitting: Physical Medicine and Rehabilitation

## 2024-03-15 ENCOUNTER — Inpatient Hospital Stay (HOSPITAL_COMMUNITY)
Admission: AD | Admit: 2024-03-15 | Discharge: 2024-03-26 | DRG: 559 | Disposition: A | Discharge status: Home / Self Care | Source: Intra-hospital | Attending: Physical Medicine and Rehabilitation | Admitting: Physical Medicine and Rehabilitation

## 2024-03-15 ENCOUNTER — Other Ambulatory Visit: Payer: Self-pay

## 2024-03-15 DIAGNOSIS — I129 Hypertensive chronic kidney disease with stage 1 through stage 4 chronic kidney disease, or unspecified chronic kidney disease: Secondary | ICD-10-CM | POA: Diagnosis present

## 2024-03-15 DIAGNOSIS — Z833 Family history of diabetes mellitus: Secondary | ICD-10-CM

## 2024-03-15 DIAGNOSIS — G4733 Obstructive sleep apnea (adult) (pediatric): Secondary | ICD-10-CM | POA: Diagnosis present

## 2024-03-15 DIAGNOSIS — Z7901 Long term (current) use of anticoagulants: Secondary | ICD-10-CM

## 2024-03-15 DIAGNOSIS — G546 Phantom limb syndrome with pain: Secondary | ICD-10-CM | POA: Diagnosis present

## 2024-03-15 DIAGNOSIS — Z823 Family history of stroke: Secondary | ICD-10-CM

## 2024-03-15 DIAGNOSIS — F4321 Adjustment disorder with depressed mood: Secondary | ICD-10-CM | POA: Diagnosis present

## 2024-03-15 DIAGNOSIS — E1122 Type 2 diabetes mellitus with diabetic chronic kidney disease: Secondary | ICD-10-CM | POA: Diagnosis present

## 2024-03-15 DIAGNOSIS — I4892 Unspecified atrial flutter: Secondary | ICD-10-CM | POA: Diagnosis present

## 2024-03-15 DIAGNOSIS — E1151 Type 2 diabetes mellitus with diabetic peripheral angiopathy without gangrene: Secondary | ICD-10-CM | POA: Diagnosis present

## 2024-03-15 DIAGNOSIS — Z4781 Encounter for orthopedic aftercare following surgical amputation: Secondary | ICD-10-CM | POA: Diagnosis present

## 2024-03-15 DIAGNOSIS — D496 Neoplasm of unspecified behavior of brain: Secondary | ICD-10-CM | POA: Diagnosis present

## 2024-03-15 DIAGNOSIS — Z981 Arthrodesis status: Secondary | ICD-10-CM

## 2024-03-15 DIAGNOSIS — G936 Cerebral edema: Secondary | ICD-10-CM | POA: Diagnosis present

## 2024-03-15 DIAGNOSIS — D75839 Thrombocytosis, unspecified: Secondary | ICD-10-CM | POA: Diagnosis present

## 2024-03-15 DIAGNOSIS — T380X5A Adverse effect of glucocorticoids and synthetic analogues, initial encounter: Secondary | ICD-10-CM | POA: Diagnosis present

## 2024-03-15 DIAGNOSIS — Z79899 Other long term (current) drug therapy: Secondary | ICD-10-CM

## 2024-03-15 DIAGNOSIS — Z794 Long term (current) use of insulin: Secondary | ICD-10-CM | POA: Diagnosis not present

## 2024-03-15 DIAGNOSIS — E66811 Obesity, class 1: Secondary | ICD-10-CM | POA: Diagnosis present

## 2024-03-15 DIAGNOSIS — F419 Anxiety disorder, unspecified: Secondary | ICD-10-CM | POA: Diagnosis present

## 2024-03-15 DIAGNOSIS — Z87891 Personal history of nicotine dependence: Secondary | ICD-10-CM

## 2024-03-15 DIAGNOSIS — I998 Other disorder of circulatory system: Secondary | ICD-10-CM | POA: Diagnosis present

## 2024-03-15 DIAGNOSIS — E1165 Type 2 diabetes mellitus with hyperglycemia: Secondary | ICD-10-CM | POA: Diagnosis present

## 2024-03-15 DIAGNOSIS — I70222 Atherosclerosis of native arteries of extremities with rest pain, left leg: Secondary | ICD-10-CM | POA: Diagnosis present

## 2024-03-15 DIAGNOSIS — D62 Acute posthemorrhagic anemia: Secondary | ICD-10-CM | POA: Diagnosis present

## 2024-03-15 DIAGNOSIS — E785 Hyperlipidemia, unspecified: Secondary | ICD-10-CM | POA: Diagnosis present

## 2024-03-15 DIAGNOSIS — I480 Paroxysmal atrial fibrillation: Secondary | ICD-10-CM | POA: Diagnosis present

## 2024-03-15 DIAGNOSIS — Z7984 Long term (current) use of oral hypoglycemic drugs: Secondary | ICD-10-CM

## 2024-03-15 DIAGNOSIS — D72829 Elevated white blood cell count, unspecified: Secondary | ICD-10-CM | POA: Diagnosis present

## 2024-03-15 DIAGNOSIS — R739 Hyperglycemia, unspecified: Secondary | ICD-10-CM | POA: Diagnosis not present

## 2024-03-15 DIAGNOSIS — I10 Essential (primary) hypertension: Secondary | ICD-10-CM | POA: Diagnosis present

## 2024-03-15 DIAGNOSIS — D649 Anemia, unspecified: Secondary | ICD-10-CM | POA: Diagnosis present

## 2024-03-15 DIAGNOSIS — N179 Acute kidney failure, unspecified: Secondary | ICD-10-CM | POA: Diagnosis present

## 2024-03-15 DIAGNOSIS — K219 Gastro-esophageal reflux disease without esophagitis: Secondary | ICD-10-CM | POA: Diagnosis present

## 2024-03-15 DIAGNOSIS — N1831 Chronic kidney disease, stage 3a: Secondary | ICD-10-CM | POA: Diagnosis present

## 2024-03-15 DIAGNOSIS — E11319 Type 2 diabetes mellitus with unspecified diabetic retinopathy without macular edema: Secondary | ICD-10-CM | POA: Diagnosis present

## 2024-03-15 DIAGNOSIS — S78112A Complete traumatic amputation at level between left hip and knee, initial encounter: Secondary | ICD-10-CM | POA: Diagnosis not present

## 2024-03-15 DIAGNOSIS — R569 Unspecified convulsions: Secondary | ICD-10-CM | POA: Diagnosis present

## 2024-03-15 DIAGNOSIS — E871 Hypo-osmolality and hyponatremia: Secondary | ICD-10-CM | POA: Diagnosis present

## 2024-03-15 DIAGNOSIS — Z8249 Family history of ischemic heart disease and other diseases of the circulatory system: Secondary | ICD-10-CM

## 2024-03-15 DIAGNOSIS — G4700 Insomnia, unspecified: Secondary | ICD-10-CM | POA: Diagnosis present

## 2024-03-15 DIAGNOSIS — I2510 Atherosclerotic heart disease of native coronary artery without angina pectoris: Secondary | ICD-10-CM | POA: Diagnosis present

## 2024-03-15 DIAGNOSIS — I252 Old myocardial infarction: Secondary | ICD-10-CM

## 2024-03-15 DIAGNOSIS — Z808 Family history of malignant neoplasm of other organs or systems: Secondary | ICD-10-CM

## 2024-03-15 DIAGNOSIS — Z89612 Acquired absence of left leg above knee: Secondary | ICD-10-CM | POA: Diagnosis not present

## 2024-03-15 DIAGNOSIS — C719 Malignant neoplasm of brain, unspecified: Secondary | ICD-10-CM | POA: Diagnosis present

## 2024-03-15 DIAGNOSIS — I4891 Unspecified atrial fibrillation: Secondary | ICD-10-CM | POA: Diagnosis present

## 2024-03-15 DIAGNOSIS — Z6833 Body mass index (BMI) 33.0-33.9, adult: Secondary | ICD-10-CM

## 2024-03-15 DIAGNOSIS — R413 Other amnesia: Secondary | ICD-10-CM

## 2024-03-15 DIAGNOSIS — E669 Obesity, unspecified: Secondary | ICD-10-CM | POA: Diagnosis present

## 2024-03-15 LAB — GLUCOSE, CAPILLARY
Glucose-Capillary: 115 mg/dL — ABNORMAL HIGH (ref 70–99)
Glucose-Capillary: 193 mg/dL — ABNORMAL HIGH (ref 70–99)
Glucose-Capillary: 247 mg/dL — ABNORMAL HIGH (ref 70–99)
Glucose-Capillary: 260 mg/dL — ABNORMAL HIGH (ref 70–99)
Glucose-Capillary: 275 mg/dL — ABNORMAL HIGH (ref 70–99)

## 2024-03-15 MED ORDER — DIPHENHYDRAMINE HCL 25 MG PO CAPS: 25.0000 mg | ORAL_CAPSULE | Freq: Four times a day (QID) | ORAL | Status: DC | PRN

## 2024-03-15 MED ORDER — BENAZEPRIL HCL 20 MG PO TABS
20.0000 mg | ORAL_TABLET | Freq: Every day | ORAL | Status: DC
  Administered 2024-03-16 – 2024-03-26 (×11): 20 mg via ORAL
  Filled 2024-03-15 (×11): qty 1

## 2024-03-15 MED ORDER — JUVEN PO PACK
1.0000 | PACK | Freq: Two times a day (BID) | ORAL | Status: DC
  Administered 2024-03-16 – 2024-03-25 (×16): 1 via ORAL
  Filled 2024-03-15 (×17): qty 1

## 2024-03-15 MED ORDER — LEVETIRACETAM 500 MG PO TABS
500.0000 mg | ORAL_TABLET | Freq: Two times a day (BID) | ORAL | Status: DC
  Administered 2024-03-15 – 2024-03-26 (×22): 500 mg via ORAL
  Filled 2024-03-15 (×22): qty 1

## 2024-03-15 MED ORDER — DOFETILIDE 500 MCG PO CAPS
500.0000 ug | ORAL_CAPSULE | Freq: Two times a day (BID) | ORAL | Status: DC
  Administered 2024-03-15 – 2024-03-26 (×22): 500 ug via ORAL
  Filled 2024-03-15 (×22): qty 1

## 2024-03-15 MED ORDER — OXYCODONE-ACETAMINOPHEN 5-325 MG PO TABS: 1.0000 | ORAL_TABLET | ORAL | Status: DC | PRN

## 2024-03-15 MED ORDER — PANTOPRAZOLE SODIUM 40 MG PO TBEC
40.0000 mg | DELAYED_RELEASE_TABLET | Freq: Every day | ORAL | Status: DC
  Administered 2024-03-16 – 2024-03-26 (×11): 40 mg via ORAL
  Filled 2024-03-15 (×11): qty 1

## 2024-03-15 MED ORDER — FLEET ENEMA RE ENEM: 1.0000 | ENEMA | Freq: Once | RECTAL | Status: DC | PRN

## 2024-03-15 MED ORDER — ESCITALOPRAM OXALATE 10 MG PO TABS
5.0000 mg | ORAL_TABLET | Freq: Every day | ORAL | Status: DC
  Administered 2024-03-16 – 2024-03-26 (×11): 5 mg via ORAL
  Filled 2024-03-15 (×11): qty 1

## 2024-03-15 MED ORDER — ATORVASTATIN CALCIUM 80 MG PO TABS
80.0000 mg | ORAL_TABLET | Freq: Every day | ORAL | Status: DC
  Administered 2024-03-15 – 2024-03-25 (×11): 80 mg via ORAL
  Filled 2024-03-15 (×11): qty 1

## 2024-03-15 MED ORDER — INSULIN ASPART 100 UNIT/ML IJ SOLN
0.0000 [IU] | Freq: Three times a day (TID) | INTRAMUSCULAR | Status: DC
  Administered 2024-03-16: 2 [IU] via SUBCUTANEOUS
  Administered 2024-03-16 – 2024-03-17 (×2): 8 [IU] via SUBCUTANEOUS
  Administered 2024-03-17: 2 [IU] via SUBCUTANEOUS
  Administered 2024-03-18 – 2024-03-20 (×5): 5 [IU] via SUBCUTANEOUS
  Administered 2024-03-20: 13:00:00 2 [IU] via SUBCUTANEOUS
  Administered 2024-03-21: 13:00:00 3 [IU] via SUBCUTANEOUS
  Administered 2024-03-21: 18:00:00 11 [IU] via SUBCUTANEOUS
  Administered 2024-03-22: 5 [IU] via SUBCUTANEOUS
  Administered 2024-03-22: 3 [IU] via SUBCUTANEOUS
  Administered 2024-03-23 (×3): 2 [IU] via SUBCUTANEOUS
  Administered 2024-03-24: 5 [IU] via SUBCUTANEOUS
  Administered 2024-03-24: 15 [IU] via SUBCUTANEOUS
  Administered 2024-03-25: 2 [IU] via SUBCUTANEOUS
  Administered 2024-03-25 (×2): 3 [IU] via SUBCUTANEOUS
  Filled 2024-03-15: qty 5
  Filled 2024-03-15: qty 12
  Filled 2024-03-15: qty 5
  Filled 2024-03-15: qty 2
  Filled 2024-03-15: qty 5
  Filled 2024-03-15: qty 3
  Filled 2024-03-15: qty 15
  Filled 2024-03-15: qty 3
  Filled 2024-03-15: qty 2
  Filled 2024-03-15: qty 13
  Filled 2024-03-15: qty 10
  Filled 2024-03-15: qty 15
  Filled 2024-03-15: qty 10
  Filled 2024-03-15: qty 5
  Filled 2024-03-15: qty 15
  Filled 2024-03-15: qty 10
  Filled 2024-03-15: qty 15
  Filled 2024-03-15: qty 10
  Filled 2024-03-15: qty 2
  Filled 2024-03-15: qty 3
  Filled 2024-03-15: qty 2
  Filled 2024-03-15: qty 15
  Filled 2024-03-15: qty 3

## 2024-03-15 MED ORDER — INSULIN GLARGINE 100 UNIT/ML ~~LOC~~ SOLN
45.0000 [IU] | Freq: Two times a day (BID) | SUBCUTANEOUS | Status: DC
  Administered 2024-03-15 – 2024-03-19 (×8): 45 [IU] via SUBCUTANEOUS
  Filled 2024-03-15 (×9): qty 0.45

## 2024-03-15 MED ORDER — ACETAMINOPHEN 325 MG PO TABS
325.0000 mg | ORAL_TABLET | ORAL | Status: DC | PRN
  Filled 2024-03-15: qty 2

## 2024-03-15 MED ORDER — METHOCARBAMOL 500 MG PO TABS: 500.0000 mg | ORAL_TABLET | Freq: Four times a day (QID) | ORAL | Status: DC | PRN

## 2024-03-15 MED ORDER — VITAMIN C 500 MG PO TABS
500.0000 mg | ORAL_TABLET | Freq: Every day | ORAL | Status: DC
  Administered 2024-03-16 – 2024-03-21 (×6): 500 mg via ORAL
  Filled 2024-03-15 (×6): qty 1

## 2024-03-15 MED ORDER — LORAZEPAM 0.5 MG PO TABS
0.5000 mg | ORAL_TABLET | Freq: Every day | ORAL | Status: DC
  Administered 2024-03-15 – 2024-03-20 (×6): 0.5 mg via ORAL
  Filled 2024-03-15 (×6): qty 1

## 2024-03-15 MED ORDER — DEXAMETHASONE 2 MG PO TABS
2.0000 mg | ORAL_TABLET | Freq: Every day | ORAL | Status: DC
  Administered 2024-03-16 – 2024-03-26 (×11): 2 mg via ORAL
  Filled 2024-03-15 (×11): qty 1

## 2024-03-15 MED ORDER — INSULIN ASPART 100 UNIT/ML IJ SOLN
10.0000 [IU] | Freq: Three times a day (TID) | INTRAMUSCULAR | Status: DC
  Administered 2024-03-16 – 2024-03-26 (×25): 10 [IU] via SUBCUTANEOUS
  Filled 2024-03-15 (×28): qty 10

## 2024-03-15 MED ORDER — ALUM & MAG HYDROXIDE-SIMETH 200-200-20 MG/5ML PO SUSP: 30.0000 mL | ORAL | Status: DC | PRN

## 2024-03-15 MED ORDER — MELATONIN 5 MG PO TABS: 5.0000 mg | ORAL_TABLET | Freq: Every evening | ORAL | Status: DC | PRN

## 2024-03-15 MED ORDER — BISACODYL 10 MG RE SUPP: 10.0000 mg | Freq: Every day | RECTAL | Status: DC | PRN

## 2024-03-15 MED ORDER — RIVAROXABAN 20 MG PO TABS
20.0000 mg | ORAL_TABLET | Freq: Every day | ORAL | Status: DC
  Administered 2024-03-16 – 2024-03-25 (×10): 20 mg via ORAL
  Filled 2024-03-15 (×11): qty 1

## 2024-03-15 MED ORDER — PROCHLORPERAZINE EDISYLATE 10 MG/2ML IJ SOLN: 5.0000 mg | Freq: Four times a day (QID) | INTRAMUSCULAR | Status: DC | PRN

## 2024-03-15 MED ORDER — INSULIN ASPART 100 UNIT/ML IJ SOLN
0.0000 [IU] | Freq: Every day | INTRAMUSCULAR | Status: DC
  Administered 2024-03-15 – 2024-03-17 (×2): 2 [IU] via SUBCUTANEOUS
  Filled 2024-03-15 (×2): qty 2

## 2024-03-15 MED ORDER — METOPROLOL SUCCINATE ER 25 MG PO TB24
25.0000 mg | ORAL_TABLET | Freq: Every day | ORAL | Status: DC
  Administered 2024-03-16 – 2024-03-26 (×11): 25 mg via ORAL
  Filled 2024-03-15 (×11): qty 1

## 2024-03-15 MED ORDER — GUAIFENESIN-DM 100-10 MG/5ML PO SYRP: 5.0000 mL | ORAL_SOLUTION | Freq: Four times a day (QID) | ORAL | Status: DC | PRN

## 2024-03-15 MED ORDER — PROCHLORPERAZINE MALEATE 5 MG PO TABS: 5.0000 mg | ORAL_TABLET | Freq: Four times a day (QID) | ORAL | Status: DC | PRN

## 2024-03-15 MED ORDER — PROCHLORPERAZINE 25 MG RE SUPP: 12.5000 mg | Freq: Four times a day (QID) | RECTAL | Status: DC | PRN

## 2024-03-15 MED ORDER — EZETIMIBE 10 MG PO TABS
10.0000 mg | ORAL_TABLET | Freq: Every day | ORAL | Status: DC
  Administered 2024-03-16 – 2024-03-26 (×11): 10 mg via ORAL
  Filled 2024-03-15 (×11): qty 1

## 2024-03-15 NOTE — Progress Notes (Signed)
 Mobility Specialist: Progress Note   03/15/24 1256  Mobility  Activity Pivoted/transferred from chair to bed  Level of Assistance Contact guard assist, steadying assist  Assistive Device None  LLE Weight Bearing Per Provider Order NWB  Activity Response Tolerated well  Mobility Referral Yes  Mobility visit 1 Mobility  Mobility Specialist Start Time (ACUTE ONLY) 1055  Mobility Specialist Stop Time (ACUTE ONLY) 1100  Mobility Specialist Time Calculation (min) (ACUTE ONLY) 5 min    Pt received in chair, requesting assistance back to bed. Dropped R arm of the recliner chair and pt was able to squat pivot into bed with CGA. No complaints. SV for sit>supine and pt able to slide himself up in bed well. Left in bed with all needs met, call bell in reach. Wife present.   Ileana Lute Mobility Specialist Please contact via SecureChat or Rehab office at [phone]

## 2024-03-15 NOTE — Progress Notes (Signed)
 Inpatient Rehabilitation Admission Medication Review by a Pharmacist  A complete drug regimen review was completed for this patient to identify any potential clinically significant medication issues.  High Risk Drug Classes Is patient taking? Indication by Medication  Antipsychotic No   Anticoagulant Yes Xarelto  - AFib  Antibiotic No   Opioid Yes Oxycodone /APAP - pain  Antiplatelet No   Hypoglycemics/insulin  Yes Lantus , novolog  - T2DM  Vasoactive Medication Yes Metoprolol , benazepril  - Afib, HTN  Chemotherapy No   Other Yes fleet enema , bisacodyl , and - constipation Maalox- indigestion Pantoprazole - reflux  Diphenhydramine- itching  Acetaminophen - pain  Robitussin- cough  Robaxin - muscle spasms   Vitamin C, Juven, Ensure - supplement Atorvastatin , Zetia  - HLP Dexamethasone  - Glioblastoma Levetiracetam  - seizure prophylaxis Dofetilide  - Afib Escitalopram - mood Lorazepam , melatonin - sleep      Type of Medication Issue Identified Description of Issue Recommendation(s)  Drug Interaction(s) (clinically significant)     Duplicate Therapy     Allergy     No Medication Administration End Date     Incorrect Dose     Additional Drug Therapy Needed     Significant med changes from prior encounter (inform family/care partners about these prior to discharge). Medications not reordered or substituted from prior to admit: -Furosemide  -Jardiance  -Ondansetron  Communicate relevant medication changes to patient/family members at discharge from CIR.   Restart or discontinue PTA meds not resumed in CIR at discharge if clinically indicated.   Other       Clinically significant medication issues were identified that warrant physician communication and completion of prescribed/recommended actions by midnight of the next day:  No  Name of provider notified for urgent issues identified:   Provider Method of Notification:     Pharmacist comments:   Time spent performing this  drug regimen review (minutes):  20    Larraine Brazier, PharmD Clinical Pharmacist 03/15/2024  8:20 PM **Pharmacist phone directory can now be found on [email or web address] ([email or web address]).  Listed under [facility].

## 2024-03-15 NOTE — Plan of Care (Signed)
   Problem: Health Behavior/Discharge Planning: Goal: Ability to manage health-related needs will improve Outcome: Progressing   Problem: Clinical Measurements: Goal: Will remain free from infection Outcome: Progressing

## 2024-03-15 NOTE — TOC Transition Note (Signed)
 Transition of Care (TOC) - Discharge Note Rayfield Gobble RN, BSN Inpatient Care Management Unit 4E- RN Case Manager See Treatment Team for direct phone #   Patient Details  Name: Douglas Hunter MRN: [id number] Date of Birth: [birth year]  Transition of Care [facility]) CM/SW Contact:  [name], RN Phone Number: 03/15/2024, 4:14 PM   Clinical Narrative:    Noted per CIR note that pt has received insurance approval on expedited appeal for INPT rehab. Bed available today for admit.   Pt has been cleared to transition to [facility] INPT rehab and will be transported once room assigned by unit staff.   [facility] liaison updated and will follow for [facility] needs post rehab stay.   Pt was provided a RW and BSC by [employer] last month. CIR will follow up on further DME needs when ready to return home from rehab.   IP CM interventions have been completed no further needs noted.   Final next level of care: IP Rehab Facility Barriers to Discharge: English As A Second Language Teacher, Barriers Resolved   Patient Goals and CMS Choice Patient states their goals for this hospitalization and ongoing recovery are:: return home and recover   Choice offered to / list presented to : Patient      Discharge Placement                 [facility] INPT rehab      Discharge Plan and Services Additional resources added to the After Visit Summary for     Discharge Planning Services: CM Consult Post Acute Care Choice: IP Rehab          DME Arranged: N/A DME Agency: NA       HH Arranged: PT, OT HH Agency: [facility] ([facility] Date HH Agency Contacted: 03/08/24 Time HH Agency Contacted: 1554 Representative spoke with at [facility] Agency: sent referrals in Hub  Social Drivers of Health (SDOH) Interventions SDOH Screenings   Food Insecurity: No Food Insecurity (03/08/2024)  Housing: Low Risk (03/08/2024)  Transportation Needs: No Transportation Needs (03/08/2024)  Utilities: At Risk (03/08/2024)   Depression (PHQ2-9): Low Risk (02/08/2024)  Financial Resource Strain: Low Risk  (01/31/2023)   Received from [facility]  Social Connections: Unknown (03/11/2024)  Tobacco Use: Medium Risk (03/07/2024)     Readmission Risk Interventions    03/15/2024    4:13 PM 02/12/2024    2:37 PM  Readmission Risk Prevention Plan  Transportation Screening Complete Complete  PCP or Specialist Appt within 3-5 Days  Complete  HRI or Home Care Consult  Complete  Social Work Consult for Recovery Care Planning/Counseling  Complete  Palliative Care Screening  Not Applicable  Medication Review Oceanographer) Complete Referral to Pharmacy  HRI or Home Care Consult Complete   SW Recovery Care/Counseling Consult Complete   Palliative Care Screening Not Applicable   Skilled Nursing Facility Not Applicable

## 2024-03-15 NOTE — Progress Notes (Signed)
 Douglas Prentice BRAVO, MD  Physician Physical Medicine and Rehabilitation   Consult Note    Signed   Date of Service: 03/11/2024  1:12 PM  Related encounter: Admission (Current) from 03/07/2024 in [facility] 4E CV SURGICAL PROGRESSIVE CARE   Signed     Expand All Collapse All           Physical Medicine and Rehabilitation Consult Reason for Consult:AKA,Left Referring Physician: [name]     HPI: Douglas Hunter is a 68 y.o. male with poorly controlled DM, Left temporal glioblastoma with Resection per Dr Rosslyn 01/29/24, developed critical limb ischemia with occlusion of Left femoral , tibial, popliteal and peroneal arteries, admitted to [facility] 12/4 for L AKA after failure of attempted revascularization procedure in November 2025.  The patient was to start chemotherapy with Temodar  as well as radiation therapy 5 days a week x 6 weeks.  Because of his arterial disease in the left lower extremity this was not initiated.  He follows up with neuro oncology Dr. [name] as an outpatient.  He lives with his wife who is able to assist this patient is obese and still needs a moderate degree of assistance with mobility.  He has a walker at home but no wheelchair  MinA /Mod Afor transfers but max A for peri care       Home: Home Living Family/patient expects to be discharged to:: Private residence Living Arrangements: Spouse/significant other, Children Available Help at Discharge: Family, Available 24 hours/day Type of Home: House Home Access: Stairs to enter, Ramped entrance (Ramp is in progress) Entrance Stairs-Number of Steps: 2 Entrance Stairs-Rails: Right, Left Home Layout: One level Bathroom Shower/Tub: Health Visitor: Handicapped height Bathroom Accessibility: Yes Home Equipment: Information systems manager - built in, [place] - single point, Crutches, Firefighter, Agricultural Consultant (2 wheels), BSC/3in1  Functional History: Prior Function Prior Level of Function : Needs assist Physical  Assist : Mobility (physical) Mobility (physical): Transfers, Gait Mobility Comments: prior to hospitalization pt was requiring some assistance with RW due to pain in the LLE ADLs Comments: Assist from family with LB ADLs Functional Status:  Mobility: Bed Mobility Overal bed mobility: Needs Assistance Bed Mobility: Supine to Sit, Sit to Supine Supine to sit: Contact guard Sit to supine: Contact guard assist General bed mobility comments: needs cues on best way to sequence Transfers Overall transfer level: Needs assistance Equipment used: Rolling walker (2 wheels), None Transfers: Sit to/from Stand, Bed to chair/wheelchair/BSC Sit to Stand: Mod assist Bed to/from chair/wheelchair/BSC transfer type:: Lateral/scoot transfer Stand pivot transfers: Contact guard assist, Mod assist Step pivot transfers:  (pt unable to advance foot with poor use of UEs on RW)  Lateral/Scoot Transfers: Contact guard assist General transfer comment: Pt completed multiple attempts at lateral transfers and when going to R side to chair was CGA to min assist but goig to L side from chair to bed required heavy mod assist. Ambulation/Gait General Gait Details: unable Pre-gait activities: Standing with RW and trying to get pt to use UEs to offload RLE with poor results   ADL: ADL Overall ADL's : Needs assistance/impaired Eating/Feeding: Set up, Sitting Grooming: Set up, Sitting Upper Body Bathing: Set up, Sitting Lower Body Bathing: Minimal assistance, Sitting/lateral leans Upper Body Dressing : Set up, Sitting Lower Body Dressing: Sitting/lateral leans, Sit to/from stand, Minimal assistance Lower Body Dressing Details (indicate cue type and reason): does best in recline attempted in bed and was unable to complete Toilet Transfer: Stand-pivot, Moderate assistance, Rolling walker (  2 wheels) Functional mobility during ADLs: Moderate assistance, Rolling walker (2 wheels) General ADL Comments: Educated pt and wife  on use of drop arm bariatric BSC for lateral scoots and other compensatory techniques   Cognition: Cognition Orientation Level: Oriented X4 Cognition Arousal: Alert Behavior During Therapy: WFL for tasks assessed/performed     Review of Systems  Constitutional:  Positive for malaise/fatigue. Negative for weight loss.  HENT:  Negative for congestion and sore throat.   Eyes:  Negative for pain and redness.  Respiratory:  Negative for shortness of breath, wheezing and stridor.   Cardiovascular:  Negative for chest pain and palpitations.  Gastrointestinal:  Negative for abdominal pain, constipation, nausea and vomiting.  Genitourinary:  Negative for dysuria and hematuria.  Musculoskeletal:  Negative for back pain and neck pain.  Skin:  Negative for itching and rash.       Past Medical History:  Diagnosis Date   AKI (acute kidney injury) 03/13/2020   Atrial flutter (HCC)     Coronary artery disease 08/2011    a. NSTEMI 08/2011, LHC w/ severe disease of LCx with good collaterals, lesion was high-risk for intervention due to a steep angulation of the LCx coming from the left main coronary artery as well as heavy calcifications, discused at cath conference w/ consensus advising med Rx; b. 03/2020 Cath: LM nl, LAD 23m, LCX 100p w/ R->L and L->L collats. RCA dominant, nl. EF 55%. PA 33/6/19, PCWP 8.   GERD (gastroesophageal reflux disease)     H/O brain tumor 01/2024   History of echocardiogram      a. 01/2020 Echo: EF 60-65%, no rwma. Sev elev PASP. Mod dil LA.   Hyperlipidemia     Hypertension     Myocardial infarction [facility])     Paroxysmal atrial fibrillation (HCC)      a. s/p ablation 01/2013; b CHADS2VASc => 3 (HTN, DM, vascular disease); c. on Coumadin ; d. 12/2019 Amio d/c'd 2/2 hyperthyroidism.   Poorly controlled diabetes mellitus (HCC)     RBBB     Sleep apnea      uses nightly             Past Surgical History:  Procedure Laterality Date   ABLATION   01/29/2013    PVI and  CTI by Dr Kelsie for atrial flutter and paroxysmal atrial fibrillation   AMPUTATION Left 03/07/2024    Procedure: AMPUTATION, ABOVE KNEE;  Surgeon: Magda Debby SAILOR, MD;  Location: [facility] OR;  Service: Vascular;  Laterality: Left;   ANTERIOR CERVICAL DECOMP/DISCECTOMY FUSION N/A 04/18/2018    Procedure: ANTERIOR CERVICAL DECOMPRESSION/DISCECTOMY FUSION 2 LEVELS C5-7;  Surgeon: Clois Fret, MD;  Location: [facility] ORS;  Service: Neurosurgery;  Laterality: N/A;   APPLICATION OF CRANIAL NAVIGATION Left 01/29/2024    Procedure: COMPUTER-ASSISTED NAVIGATION, FOR CRANIAL PROCEDURE;  Surgeon: Rosslyn Dino HERO, MD;  Location: [facility] OR;  Service: Neurosurgery;  Laterality: Left;   APPLICATION OF WOUND VAC Left 03/07/2024    Procedure: APPLICATION, WOUND VAC;  Surgeon: Magda Debby SAILOR, MD;  Location: [facility] OR;  Service: Vascular;  Laterality: Left;   ATRIAL FIBRILLATION ABLATION N/A 01/29/2013    Procedure: ATRIAL FIBRILLATION ABLATION;  Surgeon: Lynwood JONETTA Kelsie, MD;  Location: [facility] CATH LAB;  Service: Cardiovascular;  Laterality: N/A;   ATRIAL FIBRILLATION ABLATION N/A 11/15/2021    Procedure: ATRIAL FIBRILLATION ABLATION;  Surgeon: Cindie Ole DASEN, MD;  Location: [facility] INVASIVE CV LAB;  Service: Cardiovascular;  Laterality: N/A;   CARDIAC CATHETERIZATION   08/03/2011  COLONOSCOPY WITH PROPOFOL  N/A 02/02/2016    Procedure: COLONOSCOPY WITH PROPOFOL ;  Surgeon: Rogelia Copping, MD;  Location: [facility] ENDOSCOPY;  Service: Endoscopy;  Laterality: N/A;   COLONOSCOPY WITH PROPOFOL  N/A 04/20/2023    Procedure: COLONOSCOPY WITH PROPOFOL ;  Surgeon: Copping Rogelia, MD;  Location: [facility] ENDOSCOPY;  Service: Endoscopy;  Laterality: N/A;   CRANIOTOMY Left 01/29/2024    Procedure: CRANIOTOMY TUMOR EXCISION;  Surgeon: Rosslyn Dino HERO, MD;  Location: [facility] OR;  Service: Neurosurgery;  Laterality: Left;  LEFT TEMPORAL MASS RESCETION   ENDARTERECTOMY FEMORAL Left 02/09/2024    Procedure: LEFT FEMORAL ENDARTERECTOMY;  Surgeon: Magda Debby SAILOR, MD;   Location: [facility] OR;  Service: Vascular;  Laterality: Left;   FASCIECTOMY, LOWER EXTREMITY Left 02/09/2024    Procedure: 4 COMPARTMENT FASCIECTOMY, LEFT LOWER EXTREMITY;  Surgeon: Magda Debby SAILOR, MD;  Location: [facility] OR;  Service: Vascular;  Laterality: Left;   LOWER EXTREMITY ANGIOGRAM Left 02/09/2024    Procedure: INTRAOPERATIVE LEFT LOWER EXTREMITY ANGIOGRAM;  Surgeon: Magda Debby SAILOR, MD;  Location: [facility] OR;  Service: Vascular;  Laterality: Left;   LUMBAR LAMINECTOMY/DECOMPRESSION MICRODISCECTOMY N/A 06/07/2017    Procedure: LUMBAR LAMINECTOMY/DECOMPRESSION MICRODISCECTOMY 2 LEVELS-L3-4,L4-5;  Surgeon: Clois Fret, MD;  Location: [facility] ORS;  Service: Neurosurgery;  Laterality: N/A;   PATCH ANGIOPLASTY   02/09/2024    Procedure: ANGIOPLASTY LEFT FEMORAL ARTERY USING XENOSURE BOVINE PERICARDIUM PATCH;  Surgeon: Magda Debby SAILOR, MD;  Location: [facility] OR;  Service: Vascular;;   POLYPECTOMY   04/20/2023    Procedure: POLYPECTOMY;  Surgeon: Copping Rogelia, MD;  Location: [facility] ENDOSCOPY;  Service: Endoscopy;;   RIGHT/LEFT HEART CATH AND CORONARY ANGIOGRAPHY N/A 03/06/2020    Procedure: RIGHT/LEFT HEART CATH AND CORONARY ANGIOGRAPHY;  Surgeon: Perla Evalene PARAS, MD;  Location: [facility] INVASIVE CV LAB;  Service: Cardiovascular;  Laterality: N/A;   TEE WITHOUT CARDIOVERSION N/A 01/28/2013    Procedure: TRANSESOPHAGEAL ECHOCARDIOGRAM (TEE);  Surgeon: Redell GORMAN Shallow, MD;  Location: [facility] ENDOSCOPY;  Service: Cardiovascular;  Laterality: N/A;   THROMBECTOMY OF BYPASS GRAFT FEMORAL- PERONEAL ARTERY Left 02/09/2024    Procedure: THROMBECTOMY OF LEFT FEMORAL AND POTERIOR TIBIAL  ARTERY;  Surgeon: Magda Debby SAILOR, MD;  Location: [facility] OR;  Service: Vascular;  Laterality: Left;             Family History  Problem Relation Age of Onset   Stroke Mother     Heart disease Mother          pacemaker   Heart attack Father 68   Throat cancer Father          throat   Heart disease Paternal Grandfather          MI   Diabetes  Son     Heart attack Maternal Uncle          Social History:  reports that he quit smoking about 16 years ago. His smoking use included cigarettes. He started smoking about 51 years ago. He has a 52.5 pack-year smoking history. He has never used smokeless tobacco. He reports that he does not currently use alcohol. He reports that he does not use drugs. Allergies:  Allergies  No Known Allergies         Medications Prior to Admission  Medication Sig Dispense Refill   acetaminophen  (TYLENOL ) 500 MG tablet Take 500 mg by mouth 4 (four) times daily as needed for mild pain or moderate pain.       atorvastatin  (LIPITOR ) 80 MG tablet Take 80 mg by mouth at bedtime.  benazepril  (LOTENSIN ) 40 MG tablet TAKE 1 TABLET BY MOUTH ONCE DAILY (Patient taking differently: Take 40 mg by mouth daily.) 90 tablet 3   cholecalciferol  (VITAMIN D3) 25 MCG (1000 UNIT) tablet Take 500 Units by mouth daily.       dexamethasone  (DECADRON ) 2 MG tablet Take 1 tablet (2 mg total) by mouth daily. 30 tablet 0   dofetilide  (TIKOSYN ) 500 MCG capsule TAKE 1 CAPSULE BY MOUTH TWICE DAILY 180 capsule 2   ezetimibe  (ZETIA ) 10 MG tablet TAKE 1 TABLET BY MOUTH ONCE DAILY 90 tablet 3   furosemide  (LASIX ) 20 MG tablet TAKE 1 TABLET BY MOUTH TWICE DAILY AS NEEDED (Patient taking differently: Take 20 mg by mouth daily.) 180 tablet 0   insulin  lispro (HUMALOG ) 100 UNIT/ML KiwkPen Inject 16 Units into the skin 3 (three) times daily. Sliding Scale       JARDIANCE  25 MG TABS tablet Take 12.5 mg by mouth daily.       LANTUS  SOLOSTAR 100 UNIT/ML Solostar Pen Inject 25 Units into the skin 2 (two) times daily.       levETIRAcetam  (KEPPRA ) 500 MG tablet Take 1 tablet (500 mg total) by mouth 2 (two) times daily. 60 tablet 0   metoprolol  succinate (TOPROL -XL) 25 MG 24 hr tablet TAKE 1 TABLET BY MOUTH ONCE DAILY 90 tablet 1   nitroGLYCERIN  (NITROSTAT ) 0.4 MG SL tablet Place 1 tablet (0.4 mg total) under the tongue every 5 (five) minutes as  needed for chest pain. 25 tablet 1   OVER THE COUNTER MEDICATION Take 1 capsule by mouth daily. Omega XL       oxyCODONE -acetaminophen  (PERCOCET/ROXICET) 5-325 MG tablet Take 1 tablet by mouth every 4 (four) hours as needed for severe pain (pain score 7-10). 20 tablet 0   pantoprazole  (PROTONIX ) 40 MG tablet Take 1 tablet (40 mg total) by mouth daily. Stop taking if no longer on decadron . 30 tablet 3   rivaroxaban  (XARELTO ) 20 MG TABS tablet Take 1 tablet (20 mg total) by mouth daily with supper. 30 tablet 3   baclofen  (LIORESAL ) 10 MG tablet Take 10 mg by mouth 2 (two) times daily as needed (neck pain). (Patient not taking: Reported on 03/06/2024)       butalbital -acetaminophen -caffeine  (BAC, BUTALBITAL -ACETAMIN-CAFF,) 50-325-40 MG tablet Take 2 tablets by mouth every 4 (four) hours as needed for headache. (Patient not taking: Reported on 03/06/2024)       Continuous Blood Gluc Sensor (FREESTYLE LIBRE 14 DAY SENSOR) MISC SMARTSIG:1 Kit(s) Topical Every 2 Weeks       INSULIN  SYRINGE 1CC/29G 29G X 1/2 1 ML MISC USE AS DIRECTED 100 each PRN   [START ON 03/13/2024] ondansetron  (ZOFRAN ) 8 MG tablet Take 1 tablet (8 mg total) by mouth every 8 (eight) hours as needed for nausea or vomiting. May take 30-60 minutes prior to Temodar  administration if nausea/vomiting occurs as needed. (Patient not taking: Reported on 03/06/2024) 30 tablet 1   ONE TOUCH ULTRA TEST test strip TEST THREE TIMES A DAY AS DIRECTED. 100 each 12            Blood pressure (!) 129/55, pulse 69, temperature 98.4 F (36.9 C), temperature source Oral, resp. rate 15, height 5' 11 (1.803 m), weight 110.2 kg, SpO2 100%. Physical Exam Vitals and nursing note reviewed.  Constitutional:      Appearance: He is obese.  HENT:     Head: Normocephalic and atraumatic.  Eyes:     General: No visual field deficit.  Extraocular Movements: Extraocular movements intact.     Conjunctiva/sclera: Conjunctivae normal.     Pupils: Pupils are equal,  round, and reactive to light.  Cardiovascular:     Rate and Rhythm: Normal rate and regular rhythm.     Pulses: Normal pulses.     Heart sounds: Normal heart sounds.  Pulmonary:     Effort: Pulmonary effort is normal.     Breath sounds: Normal breath sounds.  Abdominal:     General: Abdomen is flat.  Musculoskeletal:     Right lower leg: No edema.     Left lower leg: Deformity present. No edema.     Comments: Left above-knee amputation with incisional VAC in place, no pain with range of motion Right lower limb has pedal edema 1+ to the ankle Ankle range of motion mildly reduced on the right side knee range of motion is normal without joint deformity.  No joint deformities noted in the upper limbs  Skin:    General: Skin is warm and dry.  Neurological:     Mental Status: He is alert and oriented to person, place, and time.     Cranial Nerves: No dysarthria or facial asymmetry.     Sensory: No sensory deficit.     Motor: No weakness, tremor or abnormal muscle tone.     Gait: Gait normal.     Comments: Motor strength is 5/5 bilateral deltoid, bicep, tricep, grip, hip flexor, knee extensor, ankle dorsiflexor and plantar flexor Sensations intact to light touch bilateral upper and lower limbs Finger-nose-finger testing is normal bilaterally Speech no dysarthria or aphasia but he does have mildly increased latency of response.  Appears to be mildly hard of hearing  Psychiatric:        Mood and Affect: Mood normal.        Behavior: Behavior normal.       Lab Results Last 24 Hours       Results for orders placed or performed during the hospital encounter of 03/07/24 (from the past 24 hours)  Glucose, capillary     Status: Abnormal    Collection Time: 03/10/24  4:27 PM  Result Value Ref Range    Glucose-Capillary 458 (H) 70 - 99 mg/dL  Glucose, capillary     Status: Abnormal    Collection Time: 03/10/24  9:10 PM  Result Value Ref Range    Glucose-Capillary 306 (H) 70 - 99 mg/dL     Comment 1 Notify RN      Comment 2 Document in Chart    Renal function panel     Status: Abnormal    Collection Time: 03/11/24  3:50 AM  Result Value Ref Range    Sodium 135 135 - 145 mmol/L    Potassium 4.2 3.5 - 5.1 mmol/L    Chloride 101 98 - 111 mmol/L    CO2 21 (L) 22 - 32 mmol/L    Glucose, Bld 312 (H) 70 - 99 mg/dL    BUN 27 (H) 8 - 23 mg/dL    Creatinine, Ser 8.94 0.61 - 1.24 mg/dL    Calcium  8.7 (L) 8.9 - 10.3 mg/dL    Phosphorus 3.6 2.5 - 4.6 mg/dL    Albumin  2.0 (L) 3.5 - 5.0 g/dL    GFR, Estimated >39 >39 mL/min    Anion gap 13 5 - 15  Magnesium      Status: None    Collection Time: 03/11/24  3:50 AM  Result Value Ref Range    Magnesium   1.8 1.7 - 2.4 mg/dL  CBC     Status: Abnormal    Collection Time: 03/11/24  3:50 AM  Result Value Ref Range    WBC 13.7 (H) 4.0 - 10.5 K/uL    RBC 3.19 (L) 4.22 - 5.81 MIL/uL    Hemoglobin 9.8 (L) 13.0 - 17.0 g/dL    HCT 70.2 (L) 60.9 - 52.0 %    MCV 93.1 80.0 - 100.0 fL    MCH 30.7 26.0 - 34.0 pg    MCHC 33.0 30.0 - 36.0 g/dL    RDW 85.3 88.4 - 84.4 %    Platelets 398 150 - 400 K/uL    nRBC 0.0 0.0 - 0.2 %  Glucose, capillary     Status: Abnormal    Collection Time: 03/11/24  5:56 AM  Result Value Ref Range    Glucose-Capillary 294 (H) 70 - 99 mg/dL    Comment 1 Notify RN      Comment 2 Document in Chart    Glucose, capillary     Status: Abnormal    Collection Time: 03/11/24  9:59 AM  Result Value Ref Range    Glucose-Capillary 312 (H) 70 - 99 mg/dL  Glucose, capillary     Status: Abnormal    Collection Time: 03/11/24 11:52 AM  Result Value Ref Range    Glucose-Capillary 282 (H) 70 - 99 mg/dL      Imaging Results (Last 48 hours)  No results found.     Assessment/Plan: Diagnosis: Left above-the-knee amputation due to peripheral vascular disease Does the need for close, 24 hr/day medical supervision in concert with the patient's rehab needs make it unreasonable for this patient to be served in a less intensive setting?  Yes Co-Morbidities requiring supervision/potential complications:  - Glioblastoma status postresection awaiting chemotherapy and radiation therapy, uncontrolled diabetes Due to bladder management, bowel management, safety, skin/wound care, disease management, medication administration, pain management, and patient education, does the patient require 24 hr/day rehab nursing? Yes Does the patient require coordinated care of a physician, rehab nurse, therapy disciplines of PT, OT to address physical and functional deficits in the context of the above medical diagnosis(es)? Yes Addressing deficits in the following areas: balance, endurance, locomotion, strength, transferring, bowel/bladder control, bathing, dressing, feeding, grooming, toileting, and psychosocial support Can the patient actively participate in an intensive therapy program of at least 3 hrs of therapy per day at least 5 days per week? Yes The potential for patient to make measurable gains while on inpatient rehab is good Anticipated functional outcomes upon discharge from inpatient rehab are modified independent and supervision  with PT, modified independent and supervision with OT, n/a with SLP. Estimated rehab length of stay to reach the above functional goals is: 10 to 14 days Anticipated discharge destination: Home Overall Rehab/Functional Prognosis: good   POST ACUTE RECOMMENDATIONS: This patient's condition is appropriate for continued rehabilitative care in the following setting: CIR Patient has agreed to participate in recommended program. Yes Note that insurance prior authorization may be required for reimbursement for recommended care.   Comment: Wife is interested in pursuing purchase or rental of a wheelchair     MEDICAL RECOMMENDATIONS: Discontinue IV pain medication     I have personally performed a face to face diagnostic evaluation of this patient. Additionally, I have examined the patient's medical record  including any pertinent labs and radiographic images.     Thanks,   Prentice FORBES Compton, MD 03/11/2024  Routing History

## 2024-03-15 NOTE — Progress Notes (Signed)
 PROGRESS NOTE    Douglas Hunter  FMW:[id number] DOB: 11-18-1955 DOA: 03/07/2024 PCP: [name], MD  Subjective: No acute events overnight. Seen and examined at bedside. No new complaints. Tolerating oral intake without n/v. Denies constipation.  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 No major postoperative complications other than hyperglycemia. Therapy recommended CIR. medically stable for discharge.    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 -Fall precaution   IDDM-2 Hyperglycemia - improving - A1c 8.0% on 10/28.  Hyperglycemia likely due to steroid.  - Uses Lantus  25 units twice daily and Humalog  16 units 3 times daily at home.  He is also on Jardiance  at home.   -Continue Semglee  40 units twice daily -Cont NovoLog  10 units 3 times daily with meals -Continue SSI-resistant scale -Discontinued Jardiance  - status post IV fluids  -Further adjustment as appropriate.   Atrial flutter/Afib/NSVT: Rate controlled. -Continue Toprol  and Tikosyn  -Continue Xarelto . -Optimize electrolytes   Temporal glioblastoma:  - Followed by oncology and radiation oncology. -Continue Keppra  and Decadron  -Chemo on hold until AKA heals.   Coronary artery disease: Stable.  No chest pain. -Continue Lipitor , Toprol -XL, benazepril  and Xarelto    Essential hypertension: Normotensive for most part. -Continue Toprol  XL -Continue home benazepril  at half home dose   Chronic kidney disease 3a  - Cr 1.44 < 1.33, baseline 0.9-1.5 - AKI ruled out after careful chart review  - status post IV fluids on 12/9 - avoid  nephrotoxic agents - renal dose meds  - monitor BMP as needed    OSA on CPAP -Refusing CPAP in the hospital.   Leukocytosis:  - Mild. Likely due to steroid and stress from surgery. - monitor clinically   Normocytic anemia: Stable after initial drop -Continue monitoring   Hyperlipidemia -Continue lipitor  80 mg po q day   Fall: Per wife, he went down on the floor when he tried to go to the bathroom the afternoon of 12/5.  No prodromes.  No apparent injury or focal deficit. -Fall precaution -PT/OT as above   Class I obesity Body mass index is 33.89 kg/m.  DVT prophylaxis: SCDs Start: 03/08/24 0006 rivaroxaban  (XARELTO ) tablet 20 mg  Xarelto    Code Status: Full Code  Disposition Plan: TBD Reason for continuing need for hospitalization: medically ready, CIR appeal pending, SW following  Objective: Vitals:   03/14/24 2300 03/15/24 0300 03/15/24 0853 03/15/24 1208  BP: (!) 146/75 (!) 141/73 (!) 155/92 138/80  Pulse: 65 65 68 66  Resp: 18 18 16 20   Temp: 97.8 F (36.6 C) 97.7 F (36.5 C) 98.7 F (37.1 C) 97.8 F (36.6 C)  TempSrc: Oral Oral Oral Oral  SpO2: 97% 98% 99% 98%  Weight:      Height:        Intake/Output Summary (Last 24 hours) at 03/15/2024 1249 Last data filed at 03/15/2024 0900 Gross per 24 hour  Intake 120 ml  Output 550 ml  Net -430 ml   Filed Weights   03/07/24 1320  Weight: 110.2 kg    Examination:  Physical Exam Vitals and nursing note reviewed.  Constitutional:      General: He is  not in acute distress.    Appearance: He is ill-appearing.  HENT:     Head: Normocephalic and atraumatic.  Cardiovascular:     Rate and Rhythm: Normal rate and regular rhythm.     Pulses: Normal pulses.     Heart sounds: Normal heart sounds.  Pulmonary:     Effort: Pulmonary effort is normal.     Breath sounds: Normal breath sounds.  Abdominal:     General: Bowel sounds are normal.     Palpations: Abdomen is soft.  Neurological:     Mental  Status: He is alert. Mental status is at baseline.     Data Reviewed: I have personally reviewed following labs and imaging studies  CBC: Recent Labs  Lab 03/09/24 0311 03/10/24 0326 03/11/24 0350 03/12/24 0341 03/13/24 0301  WBC 14.6* 14.6* 13.7* 14.7* 13.1*  HGB 10.1* 9.8* 9.8* 10.8* 10.2*  HCT 30.6* 29.8* 29.7* 33.1* 31.1*  MCV 94.2 93.4 93.1 95.1 95.4  PLT 364 368 398 490* 492*   Basic Metabolic Panel: Recent Labs  Lab 03/10/24 0326 03/11/24 0350 03/12/24 0341 03/12/24 0813 03/13/24 0301  NA 133* 135 134* 135 135  K 3.9 4.2 4.2 4.6 3.9  CL 102 101 102 102 102  CO2 23 21* 15* 22 25  GLUCOSE 266* 312* 316* 276* 181*  BUN 21 27* 31* 31* 29*  CREATININE 1.06 1.05 1.31* 1.33* 1.44*  CALCIUM  8.3* 8.7* 8.5* 8.6* 8.3*  MG  --  1.8 2.0  --  1.9  PHOS  --  3.6 4.6 4.8* 4.5   GFR: Estimated Creatinine Clearance: 61.2 mL/min (A) (by C-G formula based on SCr of 1.44 mg/dL (H)). Liver Function Tests: Recent Labs  Lab 03/11/24 0350 03/12/24 0341 03/12/24 0813 03/13/24 0301  ALBUMIN  2.0* 2.2* 2.5* 2.1*   No results for input(s): LIPASE, AMYLASE in the last 168 hours. No results for input(s): AMMONIA in the last 168 hours. Coagulation Profile: No results for input(s): INR, PROTIME in the last 168 hours. Cardiac Enzymes: No results for input(s): CKTOTAL, CKMB, CKMBINDEX, TROPONINI in the last 168 hours. ProBNP, BNP (last 5 results) No results for input(s): PROBNP, BNP in the last 8760 hours. HbA1C: No results for input(s): HGBA1C in the last 72 hours. CBG: Recent Labs  Lab 03/14/24 2053 03/15/24 0011 03/15/24 0600 03/15/24 0915 03/15/24 1207  GLUCAP 225* 193* 115* 247* 260*   Lipid Profile: No results for input(s): CHOL, HDL, LDLCALC, TRIG, CHOLHDL, LDLDIRECT in the last 72 hours. Thyroid  Function Tests: No results for input(s): TSH, T4TOTAL, FREET4, T3FREE, THYROIDAB in the last 72 hours. Anemia Panel: No  results for input(s): VITAMINB12, FOLATE, FERRITIN, TIBC, IRON, RETICCTPCT in the last 72 hours. Sepsis Labs: No results for input(s): PROCALCITON, LATICACIDVEN in the last 168 hours.  No results found for this or any previous visit (from the past 240 hours).   Radiology Studies: No results found.  Scheduled Meds:  atorvastatin   80 mg Oral QHS   benazepril   20 mg Oral Daily   dexamethasone   2 mg Oral Daily   dofetilide   500 mcg Oral BID   ezetimibe   10 mg Oral Daily   insulin  aspart  0-20 Units Subcutaneous TID WC   insulin  aspart  0-5 Units Subcutaneous QHS   insulin  aspart  10 Units Subcutaneous TID WC   insulin  glargine  45 Units Subcutaneous BID   levETIRAcetam   500 mg Oral BID   metoprolol  succinate  25 mg Oral Daily   pantoprazole   40 mg  Oral Daily   rivaroxaban   20 mg Oral Q supper   Continuous Infusions:   LOS: 8 days   Norval Bar, MD  [facility]  03/15/2024, 12:49 PM

## 2024-03-15 NOTE — Progress Notes (Signed)
 Physical Therapy Treatment Patient Details Name: Douglas Hunter MRN: [id number] DOB: 01-17-1956 Today's Date: 03/15/2024   History of Present Illness 68 yo male presenting with L foot pain. Pt s/p 12/4 L AKA PMH afib on xarleto,  DM2 HLD 10/27 resection of L temporal glioblastoma OSA on CPAP, AKI, CAD, MI, RBBB, 11/7 L fem-pop thrombectoendarterectomy    PT Comments  Pt awaiting word on Rehab appeal, so planning on discharge home if things fall through. PT introduced wheelchair and provided education on safety with transfers, propulsion, and turning. Pt excited to be out of room and feel the independence of being able to move on his own. Pt practiced propulsion forward and back as well as zero radius turning and navigating slight incline. Because insurance will not cover wheelchair, discussed with wife wheelchair features that would be helpful when she buys one. PT will continue to follow acutely but pt will either discharge to AIR or home over the weekend.    If plan is discharge home, recommend the following: Help with stairs or ramp for entrance;Assist for transportation;Assistance with cooking/housework;Two people to help with walking and/or transfers;Direct supervision/assist for medications management;Direct supervision/assist for financial management;Supervision due to cognitive status   Can travel by private vehicle      Yes  Equipment Recommendations  Wheelchair cushion (measurements PT);Wheelchair (measurements PT);Hoyer lift;Hospital bed (20 inches (per TOC note cannot get because recently got a walker??))       Precautions / Restrictions Precautions Precautions: Fall Recall of Precautions/Restrictions: Impaired Precaution/Restrictions Comments: attempted transfer with son 12/5 with near fall Restrictions Weight Bearing Restrictions Per Provider Order: Yes LLE Weight Bearing Per Provider Order: Non weight bearing Other Position/Activity Restrictions: AKA     Mobility  Bed  Mobility Overal bed mobility: Needs Assistance Bed Mobility: Supine to Sit     Supine to sit: HOB elevated, Used rails, Supervision     General bed mobility comments: supervision for safety able to use bed rail to pull    Transfers Overall transfer level: Needs assistance Equipment used: Rolling walker (2 wheels), None Transfers: Bed to chair/wheelchair/BSC            Lateral/Scoot Transfers: Supervision General transfer comment: vc for scooting as close to wheelchair as possible before making scooting transfer into wheelchair, cues for removing armrest and then pt able to scoot back to bed    Ambulation/Gait               General Gait Details: unable     Corporate Treasurer Wheelchair mobility: Yes Wheelchair propulsion: Both upper extremities Wheelchair parts: Needs assistance Distance: 100 Wheelchair Assistance Details (indicate cue type and reason): pt able to propel himself 100 feet multiple times, practiced zero radius turning and going down slight incline.         Balance Overall balance assessment: Needs assistance Sitting-balance support: Feet supported Sitting balance-[name] Scale: Good     Standing balance support: Bilateral upper extremity supported, Reliant on assistive device for balance, During functional activity Standing balance-[name] Scale: Poor Standing balance comment: Reliant on RW                            Communication Communication Communication: Impaired Factors Affecting Communication: Reduced clarity of speech  Cognition Arousal: Alert Behavior During Therapy: WFL for tasks assessed/performed   PT - Cognitive impairments: Awareness, Memory, Attention, Sequencing, Problem solving, Safety/Judgement  PT - Cognition Comments: better command follow today Following commands: Impaired Following commands impaired: Follows one step commands with increased time, Follows  multi-step commands with increased time    Cueing Cueing Techniques: Verbal cues, Visual cues, Gestural cues, Tactile cues     General Comments General comments (skin integrity, edema, etc.): spouse present during session, asking question about features to order on his wheelchair, pt obviously pleased to be off floor and to be able to move independently going to the end of hall and speaking with other patients.      Pertinent Vitals/Pain Pain Assessment Pain Assessment: No/denies pain     PT Goals (current goals can now be found in the care plan section) Acute Rehab PT Goals Patient Stated Goal: to return home safety with family and start radiation PT Goal Formulation: With patient/family Time For Goal Achievement: 03/22/24 Potential to Achieve Goals: Good    Frequency    Min 3X/week       AM-PAC PT 6 Clicks Mobility   Outcome Measure  Help needed turning from your back to your side while in a flat bed without using bedrails?: A Little Help needed moving from lying on your back to sitting on the side of a flat bed without using bedrails?: A Little Help needed moving to and from a bed to a chair (including a wheelchair)?: Total Help needed standing up from a chair using your arms (e.g., wheelchair or bedside chair)?: A Lot Help needed to walk in hospital room?: Total Help needed climbing 3-5 steps with a railing? : Total 6 Click Score: 11    End of Session Equipment Utilized During Treatment: Gait belt Activity Tolerance: Patient tolerated treatment well Patient left: with call bell/phone within reach;in chair;with chair alarm set;with family/visitor present Nurse Communication: Mobility status PT Visit Diagnosis: Unsteadiness on feet (R26.81);Other abnormalities of gait and mobility (R26.89);Muscle weakness (generalized) (M62.81)     Time: 8497-8449 PT Time Calculation (min) (ACUTE ONLY): 48 min  Charges:    $Therapeutic Activity: 8-22 mins $Wheel Chair  Management: 23-37 mins PT General Charges $$ ACUTE PT VISIT: 1 Visit                     Douglas Hunter B. Douglas Hunter PT, DPT Acute Rehabilitation Services Please use secure chat or  Call Office [phone]    Douglas Hunter Douglas Hunter 03/15/2024, 4:00 PM

## 2024-03-15 NOTE — TOC Progression Note (Signed)
 Transition of Care [facility]) - Progression Note    Patient Details  Name: Douglas Hunter MRN: [id number] Date of Birth: [birth year]  [facility] CM/SW Contact  Montie LOISE Louder, [place] Phone Number: 03/15/2024, 1:51 PM  Clinical Narrative:     CSW met with patient and spouse at bedside to discuss disposition plan if insurance does not approve CIR- patient and spouse confirmed if unable to d/c to CIR they plan will be to d/c home with outpatient PT/OT.  All questions answered.  CM updated  TOC will continue to follow and assist with discharge planning.  Montie Louder, MSW, LCSW Clinical Social Worker      Expected Discharge Plan: Home w Home Health Services Barriers to Discharge: Continued Medical Work up               Expected Discharge Plan and Services   Discharge Planning Services: CM Consult Post Acute Care Choice: Home Health, Durable Medical Equipment Living arrangements for the past 2 months: Single Family Home                           HH Arranged: PT, OT   Date HH Agency Contacted: 03/08/24 Time HH Agency Contacted: 1554 Representative spoke with at [facility] Agency: sent referrals in Hub   Social Drivers of Health (SDOH) Interventions SDOH Screenings   Food Insecurity: No Food Insecurity (03/08/2024)  Housing: Low Risk (03/08/2024)  Transportation Needs: No Transportation Needs (03/08/2024)  Utilities: At Risk (03/08/2024)  Depression (PHQ2-9): Low Risk (02/08/2024)  Financial Resource Strain: Low Risk  (01/31/2023)   Received from [facility]  Social Connections: Unknown (03/11/2024)  Tobacco Use: Medium Risk (03/07/2024)    Readmission Risk Interventions    02/12/2024    2:37 PM  Readmission Risk Prevention Plan  Transportation Screening Complete  PCP or Specialist Appt within 3-5 Days Complete  HRI or Home Care Consult Complete  Social Work Consult for Recovery Care Planning/Counseling Complete  Palliative Care  Screening Not Applicable  Medication Review Oceanographer) Referral to Pharmacy

## 2024-03-15 NOTE — Discharge Summary (Signed)
 [facility] Physician Discharge Summary   Patient name: Douglas Hunter  Admit date:     03/07/2024  Discharge date: 03/15/2024  Attending Physician: DOUTOVA, ANASTASSIA [[id number]]  Discharge Physician: Norval Bar   PCP: [name], MD  Admitted From: Home   Disposition:  Comprehensive Inpatient Rehab  Recommendations for Outpatient Follow-up:  Follow up with PCP in 1-2 weeks Follow up with Vascular surgery as scheduled  Home Health:No Equipment/Devices: @ECDMELIST @  Discharge Condition:Stable CODE STATUS:FULL Diet recommendation: Heart Healthy/Diabetic Fluid Restriction: None  Hospital Summary:  68 year old M with PMH of A-fib on Xarelto , 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 No major postoperative complications other than hyperglycemia. Vascular surgery followed the L AKA stump which initially had a wound vac that was later removed with closure and good healing. Patient was seen by PT and PM&R with plan for discharge to inpatient rehab facility at this time.  - will need to follow up with vascular surgery as scheduled 04/09/24 outpatient - consider repeat blood work to monitor renal function that seems to fluctuate but be near baseline   Discharge Diagnoses:  Principal Problem:   Above knee amputation of left lower extremity (HCC) Active Problems:   HTN (hypertension)   HLD (hyperlipidemia)   Atrial flutter (HCC)   Coronary artery disease   Obesity, unspecified   Type 2 diabetes mellitus with hyperglycemia (HCC)   OSA on CPAP   Brain tumor (HCC)   Glioblastoma (HCC)   Acute lower limb ischemia   Leukocytosis   Critical limb ischemia of left lower extremity (HCC)   Anemia   Chronic diastolic CHF (congestive heart failure) [facility])   Discharge Instructions  Discharge Instructions     Increase  activity slowly   Complete by: As directed    No dressing needed   Complete by: As directed    Follow up with vascular surgery for any wound care      Allergies as of 03/15/2024   No Known Allergies      Medication List     TAKE these medications    acetaminophen  500 MG tablet Commonly known as: TYLENOL  Take 500 mg by mouth 4 (four) times daily as needed for mild pain or moderate pain.   atorvastatin  80 MG tablet Commonly known as: LIPITOR  Take 80 mg by mouth at bedtime.   benazepril  40 MG tablet Commonly known as: LOTENSIN  TAKE 1 TABLET BY MOUTH ONCE DAILY   cholecalciferol  25 MCG (1000 UNIT) tablet Commonly known as: VITAMIN D3 Take 500 Units by mouth daily.   dexamethasone  2 MG tablet Commonly known as: DECADRON  Take 1 tablet (2 mg total) by mouth daily.   dofetilide  500 MCG capsule Commonly known as: TIKOSYN  TAKE 1 CAPSULE BY MOUTH TWICE DAILY   ezetimibe  10 MG tablet Commonly known as: ZETIA  TAKE 1 TABLET BY MOUTH ONCE DAILY   FreeStyle Libre 14 Day Sensor Misc SMARTSIG:1 Kit(s) Topical Every 2 Weeks   furosemide  20 MG tablet Commonly known as: LASIX  TAKE 1 TABLET BY MOUTH TWICE DAILY AS NEEDED What changed: when to take this   insulin  lispro 100 UNIT/ML KiwkPen Commonly known as: HUMALOG  Inject 16 Units into the skin 3 (three) times daily. Sliding Scale   INSULIN  SYRINGE 1CC/29G 29G X 1/2 1 ML Misc USE AS DIRECTED   Jardiance  25 MG Tabs tablet Generic drug: empagliflozin  Take 12.5  mg by mouth daily.   Lantus  SoloStar 100 UNIT/ML Solostar Pen Generic drug: insulin  glargine Inject 25 Units into the skin 2 (two) times daily.   levETIRAcetam  500 MG tablet Commonly known as: Keppra  Take 1 tablet (500 mg total) by mouth 2 (two) times daily.   metoprolol  succinate 25 MG 24 hr tablet Commonly known as: TOPROL -XL TAKE 1 TABLET BY MOUTH ONCE DAILY   nitroGLYCERIN  0.4 MG SL tablet Commonly known as: NITROSTAT  Place 1 tablet (0.4 mg total)  under the tongue every 5 (five) minutes as needed for chest pain.   ondansetron  8 MG tablet Commonly known as: ZOFRAN  Take 1 tablet (8 mg total) by mouth every 8 (eight) hours as needed for nausea or vomiting. May take 30-60 minutes prior to Temodar  administration if nausea/vomiting occurs as needed.   ONE TOUCH ULTRA TEST test strip Generic drug: glucose blood TEST THREE TIMES A DAY AS DIRECTED.   OVER THE COUNTER MEDICATION Take 1 capsule by mouth daily. Omega XL   oxyCODONE -acetaminophen  5-325 MG tablet Commonly known as: PERCOCET/ROXICET Take 1 tablet by mouth every 4 (four) hours as needed for severe pain (pain score 7-10).   pantoprazole  40 MG tablet Commonly known as: PROTONIX  Take 1 tablet (40 mg total) by mouth daily. Stop taking if no longer on decadron .   Xarelto  20 MG Tabs tablet Generic drug: rivaroxaban  Take 1 tablet (20 mg total) by mouth daily with supper.               Durable Medical Equipment  (From admission, onward)           Start     Ordered   03/09/24 0802  For home use only DME Bedside commode  Once       Comments: drop arm bariatric BSC  Question:  Patient needs a bedside commode to treat with the following condition  Answer:  Above knee amputation of left lower extremity (HCC)   03/09/24 0801   03/09/24 0802  For home use only DME standard manual wheelchair with seat cushion  Once       Comments: Patient suffers from left above-knee amputation which impairs their ability to perform daily activities like bathing, dressing, feeding, grooming, and toileting in the home.  A cane, crutch, or walker will not resolve issue with performing activities of daily living. A wheelchair will allow patient to safely perform daily activities. Patient can safely propel the wheelchair in the home or has a caregiver who can provide assistance. Length of need Lifetime. Accessories: elevating leg rests (ELRs), wheel locks, extensions and anti-tippers.   03/09/24  0802              Discharge Care Instructions  (From admission, onward)           Start     Ordered   03/15/24 0000  No dressing needed       Comments: Follow up with vascular surgery for any wound care   03/15/24 1602            Contact information for follow-up providers     [facility]. [employer] [employer]. Go to.   Specialty: Vascular Surgery Why: Appointment for staple removal on 04/09/2024 Contact information: [address], [address] [place]  [address] [phone]             Contact information for after-discharge care     Home Medical Care  [facility]) .   Service: Home Health Services Why: HH- PT/OT arranged- they will contact you to schedule Contact information: [address] [place] [facility] [place]  [address] [phone]                    Allergies[1]  Discharge Exam: Vitals:   03/15/24 0853 03/15/24 1208  BP: (!) 155/92 138/80  Pulse: 68 66  Resp: 16 20  Temp: 98.7 F (37.1 C) 97.8 F (36.6 C)  SpO2: 99% 98%    Physical Exam Vitals and nursing note reviewed.  Constitutional:      General: He is not in acute distress.    Appearance: He is ill-appearing.  HENT:     Head: Normocephalic and atraumatic.  Cardiovascular:     Rate and Rhythm: Normal rate and regular rhythm.     Pulses: Normal pulses.     Heart sounds: Normal heart sounds.  Pulmonary:     Effort: Pulmonary effort is normal.     Breath sounds: Normal breath sounds.  Abdominal:     General: Bowel sounds are normal.     Palpations: Abdomen is soft.  Musculoskeletal:     Comments: Healing L AKA with no dressing  Neurological:     Mental Status: He is alert. Mental status is at baseline.     The results of significant diagnostics from this hospitalization (including imaging, microbiology, ancillary and laboratory) are listed below for  reference.    Microbiology: No results found for this or any previous visit (from the past 240 hours).   Labs: ProBNP, BNP (last 5 results) No results for input(s): PROBNP, BNP in the last 8760 hours. Basic Metabolic Panel: Recent Labs  Lab 03/10/24 0326 03/11/24 0350 03/12/24 0341 03/12/24 0813 03/13/24 0301  NA 133* 135 134* 135 135  K 3.9 4.2 4.2 4.6 3.9  CL 102 101 102 102 102  CO2 23 21* 15* 22 25  GLUCOSE 266* 312* 316* 276* 181*  BUN 21 27* 31* 31* 29*  CREATININE 1.06 1.05 1.31* 1.33* 1.44*  CALCIUM  8.3* 8.7* 8.5* 8.6* 8.3*  MG  --  1.8 2.0  --  1.9  PHOS  --  3.6 4.6 4.8* 4.5   Liver Function Tests: Recent Labs  Lab 03/11/24 0350 03/12/24 0341 03/12/24 0813 03/13/24 0301  ALBUMIN  2.0* 2.2* 2.5* 2.1*   No results for input(s): LIPASE, AMYLASE in the last 168 hours. No results for input(s): AMMONIA in the last 168 hours. CBC: Recent Labs  Lab 03/09/24 0311 03/10/24 0326 03/11/24 0350 03/12/24 0341 03/13/24 0301  WBC 14.6* 14.6* 13.7* 14.7* 13.1*  HGB 10.1* 9.8* 9.8* 10.8* 10.2*  HCT 30.6* 29.8* 29.7* 33.1* 31.1*  MCV 94.2 93.4 93.1 95.1 95.4  PLT 364 368 398 490* 492*   Cardiac Enzymes: No results for input(s): CKTOTAL, CKMB, CKMBINDEX, TROPONINI, TROPONINIHS in the last 168 hours. BNP: No results for input(s): BNP in the last 168 hours. CBG: Recent Labs  Lab 03/14/24 2053 03/15/24 0011 03/15/24 0600 03/15/24 0915 03/15/24 1207  GLUCAP 225* 193* 115* 247* 260*   D-Dimer No results for input(s): DDIMER in the last 72 hours. Hgb A1c No results for input(s): HGBA1C in the last 72 hours. Lipid Profile No results for input(s): CHOL, HDL, LDLCALC, TRIG, CHOLHDL, LDLDIRECT in the last 72 hours. Thyroid  function studies No results for input(s): TSH, T4TOTAL, FREET4, T3FREE, THYROIDAB in the last 72 hours.  Invalid input(s): FREET3 Anemia work  up No results for input(s): VITAMINB12,  FOLATE, FERRITIN, TIBC, IRON, RETICCTPCT in the last 72 hours. Urinalysis    Component Value Date/Time   COLORURINE YELLOW 03/07/2024 1419   APPEARANCEUR CLEAR 03/07/2024 1419   APPEARANCEUR Clear 05/29/2020 0818   LABSPEC 1.020 03/07/2024 1419   LABSPEC 1.005 03/02/2014 1416   PHURINE 6.0 03/07/2024 1419   GLUCOSEU >=500 (A) 03/07/2024 1419   GLUCOSEU 300 mg/dL 88/70/7984 8583   HGBUR TRACE (A) 03/07/2024 1419   BILIRUBINUR NEGATIVE 03/07/2024 1419   BILIRUBINUR Negative 05/29/2020 0818   BILIRUBINUR NEGATIVE 03/02/2014 1416   KETONESUR NEGATIVE 03/07/2024 1419   PROTEINUR NEGATIVE 03/07/2024 1419   NITRITE NEGATIVE 03/07/2024 1419   LEUKOCYTESUR NEGATIVE 03/07/2024 1419   LEUKOCYTESUR NEGATIVE 03/02/2014 1416   Sepsis Labs Recent Labs  Lab 03/10/24 0326 03/11/24 0350 03/12/24 0341 03/13/24 0301  WBC 14.6* 13.7* 14.7* 13.1*    Procedures/Studies: VAS US  LOWER EXTREMITY ARTERIAL DUPLEX Result Date: 02/21/2024 LOWER EXTREMITY ARTERIAL DUPLEX STUDY Patient Name:  Douglas Hunter  Date of Exam:   02/21/2024 Medical Rec #: [id number]         Accession #:    [id number] Date of Birth: [birth year]         Patient Gender: M Patient Age:   25 years Exam Location:  [address] Procedure:      VAS US  LOWER EXTREMITY ARTERIAL DUPLEX Referring Phys: DEBBY ROBERTSON --------------------------------------------------------------------------------  Indications: Rest pain. High Risk Factors: Hypertension, hyperlipidemia, Diabetes, past history of                    smoking, coronary artery disease. Other Factors: Patient has left foot rest pain, which he says did not improve                artery surgery.  Vascular Interventions: 02/09/2024: left lower extremity common femoral                         thromboendarterectomy and bovine pericardial patch                         angioplasty, fem0pop thrombectomy, PTA thrombectomy, 4                         compartment fasciotomy. Current ABI:             Calcified unreliable Performing Technologist: Melissa Church BS, RVT, RDCS  Examination Guidelines: A complete evaluation includes B-mode imaging, spectral Doppler, color Doppler, and power Doppler as needed of all accessible portions of each vessel. Bilateral testing is considered an integral part of a complete examination. Limited examinations for reoccurring indications may be performed as noted.  +----------+--------+-----+--------+---------+-----------+ LEFT      PSV cm/sRatioStenosisWaveform Comments    +----------+--------+-----+--------+---------+-----------+ CFA Prox  208                  triphasic            +----------+--------+-----+--------+---------+-----------+ DFA                                     not seen    +----------+--------+-----+--------+---------+-----------+ SFA Prox  163                  triphasic            +----------+--------+-----+--------+---------+-----------+ SFA  Mid   93                   triphasic            +----------+--------+-----+--------+---------+-----------+ SFA Distal61                   triphasic            +----------+--------+-----+--------+---------+-----------+ POP Prox  126                  triphasic            +----------+--------+-----+--------+---------+-----------+ POP Distal40                            thumpy flow +----------+--------+-----+--------+---------+-----------+ TP Trunk  43                                        +----------+--------+-----+--------+---------+-----------+ ATA Mid                occluded                     +----------+--------+-----+--------+---------+-----------+ ATA Distal             occluded                     +----------+--------+-----+--------+---------+-----------+ PTA Prox               occluded                     +----------+--------+-----+--------+---------+-----------+ PTA Mid                occluded                      +----------+--------+-----+--------+---------+-----------+ PTA Distal             occluded                     +----------+--------+-----+--------+---------+-----------+ PERO Mid               occluded                     +----------+--------+-----+--------+---------+-----------+ 4.25 cm x 4.22 cm anterior to the CFA, without evidence of AVF or PSA. Heavily calcified CFA, SFA and popliteal arteries. No run-off noted below the tibial peroneal trunk.  Summary: Left: Total occlusion noted in the anterior tibial artery. Total occlusion noted in the posterior tibial artery. Total occlusion noted in the peroneal artery. CFA through the tibial peroneal trunk are patent with triphasic flow.  See table(s) above for measurements and observations. Electronically signed by Lonni Gaskins MD on 02/21/2024 at 3:32:02 PM.    Final    VAS US  ABI WITH/WO TBI Result Date: 02/21/2024  LOWER EXTREMITY DOPPLER STUDY Patient Name:  Douglas Hunter  Date of Exam:   02/21/2024 Medical Rec #: [id number]         Accession #:    [id number] Date of Birth: 09-24-1955         Patient Gender: M Patient Age:   37 years Exam Location:  [address] Procedure:      VAS US  ABI WITH/WO TBI Referring Phys: DEBBY ROBERTSON --------------------------------------------------------------------------------  Indications: Rest pain. High Risk Factors: Hypertension, hyperlipidemia, Diabetes, past history of  smoking, coronary artery disease. Other Factors: Left foot rest pain which never improved after surgery.  Vascular Interventions: 02/09/2024: left lower extremity common femoral                         thromboendarterectomy and bovine pericardial patch                         angioplasty, fem0pop thrombectomy, PTA thrombectomy, 4                         compartment fasciotomy. Comparison Study: None Performing Technologist: [employer] BS, RVT, RDCS  Examination Guidelines: A complete evaluation includes at minimum,  Doppler waveform signals and systolic blood pressure reading at the level of bilateral brachial, anterior tibial, and posterior tibial arteries, when vessel segments are accessible. Bilateral testing is considered an integral part of a complete examination. Photoelectric Plethysmograph (PPG) waveforms and toe systolic pressure readings are included as required and additional duplex testing as needed. Limited examinations for reoccurring indications may be performed as noted.  ABI Findings: +---------+------------------+-----+--------+--------+ Right    Rt Pressure (mmHg)IndexWaveformComment  +---------+------------------+-----+--------+--------+ Brachial 150                                     +---------+------------------+-----+--------+--------+ PTA      254               1.69                  +---------+------------------+-----+--------+--------+ DP       238               1.59                  +---------+------------------+-----+--------+--------+ Great Toe125               0.83                  +---------+------------------+-----+--------+--------+ +---------+------------------+-----+--------+-------+ Left     Lt Pressure (mmHg)IndexWaveformComment +---------+------------------+-----+--------+-------+ Brachial 148                                    +---------+------------------+-----+--------+-------+ PTA      254               1.69                 +---------+------------------+-----+--------+-------+ DP       82                0.55                 +---------+------------------+-----+--------+-------+ Great Toe                       Absent          +---------+------------------+-----+--------+-------+ +-------+-----------+-----------+------------+------------+ ABI/TBIToday's ABIToday's TBIPrevious ABIPrevious TBI +-------+-----------+-----------+------------+------------+ Right             .83                                  +-------+-----------+-----------+------------+------------+ Left              0                                   +-------+-----------+-----------+------------+------------+  Arterial wall calcification precludes accurate ankle pressures and ABIs.  Summary: Right: Resting right ankle-brachial index indicates noncompressible right lower extremity arteries. Right toe pressure is >60 mmHg which suggests adequate perfusion for healing. Left: Resting left ankle-brachial index indicates noncompressible left lower extremity arteries.  *See table(s) above for measurements and observations.  Electronically signed by Lonni Gaskins MD on 02/21/2024 at 3:31:26 PM.    Final     Time coordinating discharge: 50 mins  SIGNED:  Norval Bar, MD [facility] 03/15/2024, 4:02 PM     [1] No Known Allergies

## 2024-03-15 NOTE — Progress Notes (Signed)
 Inpatient Rehab Admissions Coordinator:   Awaiting determination from [facility] Medicare regarding expedited appeal request.  Will continue to follow.   Reche Lowers, PT, DPT Admissions Coordinator [phone] 03/15/2024 2:18 PM

## 2024-03-15 NOTE — Discharge Instructions (Addendum)
 Follow up with PCP within one week Follow up with Vascular Surgery as scheduled on 04/09/24 Follow up with Oncology and Radiation Oncology as scheduled

## 2024-03-15 NOTE — Plan of Care (Signed)
°  Problem: Health Behavior/Discharge Planning: Goal: Ability to manage health-related needs will improve 03/15/2024 0122 by Neville Arland SAUNDERS, RN Outcome: Progressing 03/15/2024 0111 by Neville Arland SAUNDERS, RN Outcome: Progressing   Problem: Clinical Measurements: Goal: Will remain free from infection Outcome: Progressing

## 2024-03-15 NOTE — Progress Notes (Signed)
 Inpatient Rehab Admissions Coordinator:    I have insurance approval and a bed available for pt to admit to CIR today. Dr. Cosette in agreement and [facility] aware.  I will notify pt/family and make arrangements.    Reche Lowers, PT, DPT Admissions Coordinator [phone] 03/15/2024 3:49 PM

## 2024-03-15 NOTE — H&P (Signed)
 Physical Medicine and Rehabilitation Admission H&P     CC: Functional decline due to AKA     HPI: Douglas Hunter. 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 He was admitted for thrombectomy of left femoral, femorotibial and posterior tibial thrombectomy with 4 compartment fasciotomy by Dr. Magda and discharged on Xarelto . He continued to have LLE pain affecting WB as well as sleep, dehiscence of fasciotomy wound and new interdigit and limb felt to be unsalvageable . He was admitted on 03/07/24 for L-AKA by Dr. Magda. Post op placed on heparin  drip while H/H was being monitored and stable in 9-10 range so transitioned to Xarelto    He has had issues with acute on chronic renal failure and was treated with IVF for hydration and jardiance  d/c. BS have been labile and insulin  glargline titrated up for tighter BS coverage. Wound VAC removed yesterday and wound reported to be healing well. Pain control improving but noted have adjustment reaction due to limb loss. PT/OT has been working with patient who requires multimodal cues with activity, min assist with SPT as well as cues for AE use and min to mod assist with ADL tasks. He was independent PTA and awaiting start of XRT/Chemo. CIR recommended due to functional decline.        Review of Systems  Constitutional:  Negative for chills and fever.  HENT:  Positive for hearing loss.   Eyes:  Negative for blurred vision and double vision.  Respiratory:  Negative for cough and shortness of breath.   Cardiovascular:  Negative for chest pain and claudication.  Gastrointestinal:  Negative for abdominal pain, constipation, heartburn and nausea.  Genitourinary:  Negative for dysuria and urgency.  Musculoskeletal:  Negative for back pain and neck pain.       Was in  process of having left shoulder surgery before GBM dx  Neurological:  Positive for speech change and weakness. Negative for dizziness and headaches.  Psychiatric/Behavioral:  Positive for depression. The patient is nervous/anxious and has insomnia (difficulty falling asleep).          Past Medical History:  Diagnosis Date   AKI (acute kidney injury) 03/13/2020   Atrial flutter (HCC)     Coronary artery disease 08/2011    a. NSTEMI 08/2011, LHC w/ severe disease of LCx with good collaterals, lesion was high-risk for intervention due to a steep angulation of the LCx coming from the left main coronary artery as well as heavy calcifications, discused at cath conference w/ consensus advising med Rx; b. 03/2020 Cath: LM nl, LAD 5m, LCX 100p w/ R->L and L->L collats. RCA dominant, nl. EF 55%. PA 33/6/19, PCWP 8.   GERD (gastroesophageal reflux disease)     H/O brain tumor 01/2024   History of echocardiogram      a. 01/2020 Echo: EF 60-65%, no rwma. Sev elev PASP. Mod dil LA.   Hyperlipidemia     Hypertension     Myocardial infarction [facility])     Paroxysmal atrial fibrillation (HCC)      a. s/p ablation 01/2013; b CHADS2VASc => 3 (HTN, DM, vascular disease); c. on Coumadin ; d. 12/2019 Amio d/c'd 2/2 hyperthyroidism.   Poorly controlled diabetes mellitus (HCC)     RBBB     Sleep apnea  uses nightly               Past Surgical History:  Procedure Laterality Date   ABLATION   01/29/2013    PVI and CTI by Dr Kelsie for atrial flutter and paroxysmal atrial fibrillation   AMPUTATION Left 03/07/2024    Procedure: AMPUTATION, ABOVE KNEE;  Surgeon: Magda Debby SAILOR, MD;  Location: [facility] OR;  Service: Vascular;  Laterality: Left;   ANTERIOR CERVICAL DECOMP/DISCECTOMY FUSION N/A 04/18/2018    Procedure: ANTERIOR CERVICAL DECOMPRESSION/DISCECTOMY FUSION 2 LEVELS C5-7;  Surgeon: Clois Fret, MD;  Location: [facility] ORS;  Service: Neurosurgery;  Laterality: N/A;   APPLICATION OF CRANIAL NAVIGATION  Left 01/29/2024    Procedure: COMPUTER-ASSISTED NAVIGATION, FOR CRANIAL PROCEDURE;  Surgeon: Rosslyn Dino HERO, MD;  Location: [facility] OR;  Service: Neurosurgery;  Laterality: Left;   APPLICATION OF WOUND VAC Left 03/07/2024    Procedure: APPLICATION, WOUND VAC;  Surgeon: Magda Debby SAILOR, MD;  Location: [facility] OR;  Service: Vascular;  Laterality: Left;   ATRIAL FIBRILLATION ABLATION N/A 01/29/2013    Procedure: ATRIAL FIBRILLATION ABLATION;  Surgeon: Lynwood JONETTA Kelsie, MD;  Location: [facility] CATH LAB;  Service: Cardiovascular;  Laterality: N/A;   ATRIAL FIBRILLATION ABLATION N/A 11/15/2021    Procedure: ATRIAL FIBRILLATION ABLATION;  Surgeon: Cindie Ole DASEN, MD;  Location: [facility] INVASIVE CV LAB;  Service: Cardiovascular;  Laterality: N/A;   CARDIAC CATHETERIZATION   08/03/2011   COLONOSCOPY WITH PROPOFOL  N/A 02/02/2016    Procedure: COLONOSCOPY WITH PROPOFOL ;  Surgeon: Rogelia Copping, MD;  Location: [facility] ENDOSCOPY;  Service: Endoscopy;  Laterality: N/A;   COLONOSCOPY WITH PROPOFOL  N/A 04/20/2023    Procedure: COLONOSCOPY WITH PROPOFOL ;  Surgeon: Copping Rogelia, MD;  Location: [facility] ENDOSCOPY;  Service: Endoscopy;  Laterality: N/A;   CRANIOTOMY Left 01/29/2024    Procedure: CRANIOTOMY TUMOR EXCISION;  Surgeon: Rosslyn Dino HERO, MD;  Location: [facility] OR;  Service: Neurosurgery;  Laterality: Left;  LEFT TEMPORAL MASS RESCETION   ENDARTERECTOMY FEMORAL Left 02/09/2024    Procedure: LEFT FEMORAL ENDARTERECTOMY;  Surgeon: Magda Debby SAILOR, MD;  Location: [facility] OR;  Service: Vascular;  Laterality: Left;   FASCIECTOMY, LOWER EXTREMITY Left 02/09/2024    Procedure: 4 COMPARTMENT FASCIECTOMY, LEFT LOWER EXTREMITY;  Surgeon: Magda Debby SAILOR, MD;  Location: [facility] OR;  Service: Vascular;  Laterality: Left;   LOWER EXTREMITY ANGIOGRAM Left 02/09/2024    Procedure: INTRAOPERATIVE LEFT LOWER EXTREMITY ANGIOGRAM;  Surgeon: Magda Debby SAILOR, MD;  Location: [facility] OR;  Service: Vascular;  Laterality: Left;   LUMBAR LAMINECTOMY/DECOMPRESSION MICRODISCECTOMY  N/A 06/07/2017    Procedure: LUMBAR LAMINECTOMY/DECOMPRESSION MICRODISCECTOMY 2 LEVELS-L3-4,L4-5;  Surgeon: Clois Fret, MD;  Location: [facility] ORS;  Service: Neurosurgery;  Laterality: N/A;   PATCH ANGIOPLASTY   02/09/2024    Procedure: ANGIOPLASTY LEFT FEMORAL ARTERY USING XENOSURE BOVINE PERICARDIUM PATCH;  Surgeon: Magda Debby SAILOR, MD;  Location: [facility] OR;  Service: Vascular;;   POLYPECTOMY   04/20/2023    Procedure: POLYPECTOMY;  Surgeon: Copping Rogelia, MD;  Location: [facility] ENDOSCOPY;  Service: Endoscopy;;   RIGHT/LEFT HEART CATH AND CORONARY ANGIOGRAPHY N/A 03/06/2020    Procedure: RIGHT/LEFT HEART CATH AND CORONARY ANGIOGRAPHY;  Surgeon: Perla Evalene PARAS, MD;  Location: [facility] INVASIVE CV LAB;  Service: Cardiovascular;  Laterality: N/A;   TEE WITHOUT CARDIOVERSION N/A 01/28/2013    Procedure: TRANSESOPHAGEAL ECHOCARDIOGRAM (TEE);  Surgeon: Redell GORMAN Shallow, MD;  Location: [facility] ENDOSCOPY;  Service: Cardiovascular;  Laterality: N/A;   THROMBECTOMY OF BYPASS GRAFT FEMORAL- PERONEAL ARTERY Left 02/09/2024    Procedure: THROMBECTOMY OF  LEFT FEMORAL AND POTERIOR TIBIAL  ARTERY;  Surgeon: Magda Debby SAILOR, MD;  Location: [facility] OR;  Service: Vascular;  Laterality: Left;               Family History  Problem Relation Age of Onset   Stroke Mother     Heart disease Mother          pacemaker   Heart attack Father 41   Throat cancer Father          throat   Heart disease Paternal Grandfather          MI   Diabetes Son     Heart attack Maternal Uncle            Social History:  Married. Independent PTA. He  reports that he quit smoking about 16 years ago. His smoking use included cigarettes. He started smoking about 51 years ago. He has a 52.5 pack-year smoking history. He has never used smokeless tobacco. He reports that he does not currently use alcohol. He reports that he does not use drugs.     Allergies:  Allergies  No Known Allergies             Medications Prior to Admission   Medication Sig Dispense Refill   acetaminophen  (TYLENOL ) 500 MG tablet Take 500 mg by mouth 4 (four) times daily as needed for mild pain or moderate pain.       atorvastatin  (LIPITOR ) 80 MG tablet Take 80 mg by mouth at bedtime.       benazepril  (LOTENSIN ) 40 MG tablet TAKE 1 TABLET BY MOUTH ONCE DAILY (Patient taking differently: Take 40 mg by mouth daily.) 90 tablet 3   cholecalciferol  (VITAMIN D3) 25 MCG (1000 UNIT) tablet Take 500 Units by mouth daily.       dexamethasone  (DECADRON ) 2 MG tablet Take 1 tablet (2 mg total) by mouth daily. 30 tablet 0   dofetilide  (TIKOSYN ) 500 MCG capsule TAKE 1 CAPSULE BY MOUTH TWICE DAILY 180 capsule 2   ezetimibe  (ZETIA ) 10 MG tablet TAKE 1 TABLET BY MOUTH ONCE DAILY 90 tablet 3   furosemide  (LASIX ) 20 MG tablet TAKE 1 TABLET BY MOUTH TWICE DAILY AS NEEDED (Patient taking differently: Take 20 mg by mouth daily.) 180 tablet 0   insulin  lispro (HUMALOG ) 100 UNIT/ML KiwkPen Inject 16 Units into the skin 3 (three) times daily. Sliding Scale       JARDIANCE  25 MG TABS tablet Take 12.5 mg by mouth daily.       LANTUS  SOLOSTAR 100 UNIT/ML Solostar Pen Inject 25 Units into the skin 2 (two) times daily.       levETIRAcetam  (KEPPRA ) 500 MG tablet Take 1 tablet (500 mg total) by mouth 2 (two) times daily. 60 tablet 0   metoprolol  succinate (TOPROL -XL) 25 MG 24 hr tablet TAKE 1 TABLET BY MOUTH ONCE DAILY 90 tablet 1   nitroGLYCERIN  (NITROSTAT ) 0.4 MG SL tablet Place 1 tablet (0.4 mg total) under the tongue every 5 (five) minutes as needed for chest pain. 25 tablet 1   OVER THE COUNTER MEDICATION Take 1 capsule by mouth daily. Omega XL       oxyCODONE -acetaminophen  (PERCOCET/ROXICET) 5-325 MG tablet Take 1 tablet by mouth every 4 (four) hours as needed for severe pain (pain score 7-10). 20 tablet 0   pantoprazole  (PROTONIX ) 40 MG tablet Take 1 tablet (40 mg total) by mouth daily. Stop taking if no longer on decadron . 30 tablet 3   rivaroxaban  (XARELTO )  20 MG TABS tablet  Take 1 tablet (20 mg total) by mouth daily with supper. 30 tablet 3   Continuous Blood Gluc Sensor (FREESTYLE LIBRE 14 DAY SENSOR) MISC SMARTSIG:1 Kit(s) Topical Every 2 Weeks       INSULIN  SYRINGE 1CC/29G 29G X 1/2 1 ML MISC USE AS DIRECTED 100 each PRN   ondansetron  (ZOFRAN ) 8 MG tablet Take 1 tablet (8 mg total) by mouth every 8 (eight) hours as needed for nausea or vomiting. May take 30-60 minutes prior to Temodar  administration if nausea/vomiting occurs as needed. (Patient not taking: Reported on 03/06/2024) 30 tablet 1   ONE TOUCH ULTRA TEST test strip TEST THREE TIMES A DAY AS DIRECTED. 100 each 12            Home: Home Living Family/patient expects to be discharged to:: Private residence Living Arrangements: Spouse/significant other, Children Available Help at Discharge: Family, Available 24 hours/day Type of Home: House Home Access: Stairs to enter, Ramped entrance (Ramp is in progress) Entrance Stairs-Number of Steps: 2 Entrance Stairs-Rails: Right, Left Home Layout: One level Bathroom Shower/Tub: Health Visitor: Handicapped height Bathroom Accessibility: Yes Home Equipment: Information systems manager - built in, [place] - single point, Crutches, Firefighter, Agricultural Consultant (2 wheels), BSC/3in1   Functional History: Prior Function Prior Level of Function : Needs assist Physical Assist : Mobility (physical) Mobility (physical): Transfers, Gait Mobility Comments: prior to hospitalization pt was requiring some assistance with RW due to pain in the LLE ADLs Comments: Assist from family with LB ADLs   Functional Status:  Mobility: Bed Mobility Overal bed mobility: Needs Assistance Bed Mobility: Supine to Sit Supine to sit: HOB elevated, Used rails, Supervision Sit to supine: Contact guard assist General bed mobility comments: supervision for safety able to use bed rail to pull Transfers Overall transfer level: Needs assistance Equipment used: Rolling walker (2  wheels), None Transfers: Sit to/from Stand, Bed to chair/wheelchair/BSC Sit to Stand: Min assist Bed to/from chair/wheelchair/BSC transfer type:: Lateral/scoot transfer Stand pivot transfers: Contact guard assist, Mod assist Step pivot transfers:  (pt unable to advance foot with poor use of UEs on RW)  Lateral/Scoot Transfers: Supervision General transfer comment: pt needs cuing for correct hand placement for power up and steadying in RW. supervision for lateral scooting along EoB and for transfer from bed to chair Ambulation/Gait General Gait Details: unable Pre-gait activities: Standing with RW, attempting heel raises with poor clearance, working on rotation of foot on heel R/L to prep for being able to the kroger heel/toe on RLE to pivot transfer   ADL: ADL Overall ADL's : Needs assistance/impaired Eating/Feeding: Set up, Sitting Grooming: Set up, Sitting Upper Body Bathing: Set up, Sitting Lower Body Bathing: Minimal assistance, Sitting/lateral leans Upper Body Dressing : Set up, Sitting Lower Body Dressing: Sitting/lateral leans, Sit to/from stand, Minimal assistance Lower Body Dressing Details (indicate cue type and reason): does best in recline attempted in bed and was unable to complete Toilet Transfer: Stand-pivot, Moderate assistance, Rolling walker (2 wheels) Functional mobility during ADLs: Moderate assistance, Rolling walker (2 wheels) General ADL Comments: Educated pt and wife on use of drop arm bariatric BSC for lateral scoots and other compensatory techniques   Cognition: Cognition Orientation Level: Oriented X4 Cognition Arousal: Alert Behavior During Therapy: [facility] for tasks assessed/performed   : Blood pressure 129/79, pulse 77, temperature 99.1 F (37.3 C), temperature source Oral, resp. rate 16, height 5' 11 (1.803 m), weight 110.2 kg, SpO2 99%. Physical Exam Vitals and nursing note reviewed.  Constitutional: No apparent distress. Appropriate appearance for age.   HENT: No JVD. Neck Supple. Trachea midline. Atraumatic, normocephalic. +HOH Eyes: PERRLA. EOMI. Visual fields grossly intact.  Cardiovascular: RRR, no murmurs/rub/gallops.   Respiratory: CTAB. No rales, rhonchi, or wheezing. On RA.  Abdomen: + bowel sounds, normoactive. No distention or tenderness.  Skin: C/D/I. No apparent lesions. AKA staple line well approximated, intact, no apparent drainage, open to air,  MSK:      + L AKA; diffuse 1+ edema but no drainage. Well formed      + L shoulder ROM deficit - prior RTC tear       Neurologic exam:  Cognition: AAO to person, place, time and event.   Language: Fluent, No substitutions or neoglisms. No dysarthria. Names 3/3 objects correctly.   Memory: Recalls 3/3 objects at 5 minutes. No apparent deficits   Insight: Good  insight into current condition.  Mood: Pleasant affect, appropriate mood.  Sensation: To light touch intact in BL UEs and LEs  Reflexes: 2+ in BL UE and LEs. Negative Hoffman's and babinski signs bilaterally.  CN: 2-12 grossly intact.  Coordination: No apparent tremors. No ataxia Spasticity: MAS 0 in all extremities.       Strength:                RUE: 5/5 SA, 5/5 EF, 5/5 EE, 5/5 WE, 5/5 FF, 5/5 FA                LUE:  5/5 SA, 5/5 EF, 5/5 EE, 5/5 WE, 5/5 FF, 5/5 FA                RLE: 5/5 HF, 5/5 KE, 5/5  DF, 5/5  EHL, 5/5  PF                 LLE:  5/5 HF           Lab Results Last 48 Hours        Results for orders placed or performed during the hospital encounter of 03/07/24 (from the past 48 hours)  Glucose, capillary     Status: Abnormal    Collection Time: 03/11/24  4:28 PM  Result Value Ref Range    Glucose-Capillary 211 (H) 70 - 99 mg/dL      Comment: Glucose reference range applies only to samples taken after fasting for at least 8 hours.  Glucose, capillary     Status: Abnormal    Collection Time: 03/11/24  9:20 PM  Result Value Ref Range    Glucose-Capillary 201 (H) 70 - 99 mg/dL      Comment:  Glucose reference range applies only to samples taken after fasting for at least 8 hours.  Renal function panel     Status: Abnormal    Collection Time: 03/12/24  3:41 AM  Result Value Ref Range    Sodium 134 (L) 135 - 145 mmol/L    Potassium 4.2 3.5 - 5.1 mmol/L    Chloride 102 98 - 111 mmol/L    CO2 15 (L) 22 - 32 mmol/L    Glucose, Bld 316 (H) 70 - 99 mg/dL      Comment: Glucose reference range applies only to samples taken after fasting for at least 8 hours.    BUN 31 (H) 8 - 23 mg/dL    Creatinine, Ser 8.68 (H) 0.61 - 1.24 mg/dL    Calcium  8.5 (L) 8.9 - 10.3 mg/dL    Phosphorus 4.6 2.5 - 4.6 mg/dL  Albumin  2.2 (L) 3.5 - 5.0 g/dL    GFR, Estimated 59 (L) >60 mL/min      Comment: (NOTE) Calculated using the CKD-EPI Creatinine Equation (2021)      Anion gap 17 (H) 5 - 15      Comment: Performed at [facility] Lab, [address], [name], [place] [address]  Magnesium      Status: None    Collection Time: 03/12/24  3:41 AM  Result Value Ref Range    Magnesium  2.0 1.7 - 2.4 mg/dL      Comment: Performed at [facility] Lab, [address], [place], [place] [address]  CBC     Status: Abnormal    Collection Time: 03/12/24  3:41 AM  Result Value Ref Range    WBC 14.7 (H) 4.0 - 10.5 K/uL    RBC 3.48 (L) 4.22 - 5.81 MIL/uL    Hemoglobin 10.8 (L) 13.0 - 17.0 g/dL    HCT 66.8 (L) 60.9 - 52.0 %    MCV 95.1 80.0 - 100.0 fL    MCH 31.0 26.0 - 34.0 pg    MCHC 32.6 30.0 - 36.0 g/dL    RDW 85.3 88.4 - 84.4 %    Platelets 490 (H) 150 - 400 K/uL    nRBC 0.0 0.0 - 0.2 %      Comment: Performed at [facility] Lab, [address] [place], [place] [address]  Glucose, capillary     Status: Abnormal    Collection Time: 03/12/24  6:09 AM  Result Value Ref Range    Glucose-Capillary 336 (H) 70 - 99 mg/dL      Comment: Glucose reference range applies only to samples taken after fasting for at least 8 hours.  Renal function panel     Status: Abnormal    Collection Time: 03/12/24  8:13  AM  Result Value Ref Range    Sodium 135 135 - 145 mmol/L    Potassium 4.6 3.5 - 5.1 mmol/L    Chloride 102 98 - 111 mmol/L    CO2 22 22 - 32 mmol/L    Glucose, Bld 276 (H) 70 - 99 mg/dL      Comment: Glucose reference range applies only to samples taken after fasting for at least 8 hours.    BUN 31 (H) 8 - 23 mg/dL    Creatinine, Ser 8.66 (H) 0.61 - 1.24 mg/dL    Calcium  8.6 (L) 8.9 - 10.3 mg/dL    Phosphorus 4.8 (H) 2.5 - 4.6 mg/dL    Albumin  2.5 (L) 3.5 - 5.0 g/dL    GFR, Estimated 58 (L) >60 mL/min      Comment: (NOTE) Calculated using the CKD-EPI Creatinine Equation (2021)      Anion gap 11 5 - 15      Comment: Performed at [facility] Lab, [address], [place], [place] [address]  Blood gas, venous     Status: Abnormal    Collection Time: 03/12/24  8:14 AM  Result Value Ref Range    pH, Ven 7.41 7.25 - 7.43    pCO2, Ven 41 (L) 44 - 60 mmHg    pO2, Ven <31 (LL) 32 - 45 mmHg      Comment: CRITICAL RESULT CALLED TO, READ BACK BY AND VERIFIED WITH: K.CLOER,RN [id number] 03/12/24 CLARK,S      Bicarbonate 26.0 20.0 - 28.0 mmol/L    Acid-Base Excess 1.2 0.0 - 2.0 mmol/L    O2 Saturation 43.4 %  Patient temperature 37.1      Drawn by BY [id number]        Comment: Performed at [facility] Lab, [address], [place], [place] [address]  Glucose, capillary     Status: Abnormal    Collection Time: 03/12/24  1:21 PM  Result Value Ref Range    Glucose-Capillary 218 (H) 70 - 99 mg/dL      Comment: Glucose reference range applies only to samples taken after fasting for at least 8 hours.  Glucose, capillary     Status: Abnormal    Collection Time: 03/12/24  4:23 PM  Result Value Ref Range    Glucose-Capillary 233 (H) 70 - 99 mg/dL      Comment: Glucose reference range applies only to samples taken after fasting for at least 8 hours.  Glucose, capillary     Status: Abnormal    Collection Time: 03/12/24  9:03 PM  Result Value Ref Range    Glucose-Capillary 243 (H) 70 - 99 mg/dL       Comment: Glucose reference range applies only to samples taken after fasting for at least 8 hours.    Comment 1 Notify RN      Comment 2 Document in Chart    Renal function panel     Status: Abnormal    Collection Time: 03/13/24  3:01 AM  Result Value Ref Range    Sodium 135 135 - 145 mmol/L    Potassium 3.9 3.5 - 5.1 mmol/L    Chloride 102 98 - 111 mmol/L    CO2 25 22 - 32 mmol/L    Glucose, Bld 181 (H) 70 - 99 mg/dL      Comment: Glucose reference range applies only to samples taken after fasting for at least 8 hours.    BUN 29 (H) 8 - 23 mg/dL    Creatinine, Ser 8.55 (H) 0.61 - 1.24 mg/dL    Calcium  8.3 (L) 8.9 - 10.3 mg/dL    Phosphorus 4.5 2.5 - 4.6 mg/dL    Albumin  2.1 (L) 3.5 - 5.0 g/dL    GFR, Estimated 53 (L) >60 mL/min      Comment: (NOTE) Calculated using the CKD-EPI Creatinine Equation (2021)      Anion gap 8 5 - 15      Comment: Performed at [facility] Lab, [address], [place], [place] [address]  Magnesium      Status: None    Collection Time: 03/13/24  3:01 AM  Result Value Ref Range    Magnesium  1.9 1.7 - 2.4 mg/dL      Comment: Performed at [facility], [address] [place], [place] [address]  CBC     Status: Abnormal    Collection Time: 03/13/24  3:01 AM  Result Value Ref Range    WBC 13.1 (H) 4.0 - 10.5 K/uL    RBC 3.26 (L) 4.22 - 5.81 MIL/uL    Hemoglobin 10.2 (L) 13.0 - 17.0 g/dL    HCT 68.8 (L) 60.9 - 52.0 %    MCV 95.4 80.0 - 100.0 fL    MCH 31.3 26.0 - 34.0 pg    MCHC 32.8 30.0 - 36.0 g/dL    RDW 85.3 88.4 - 84.4 %    Platelets 492 (H) 150 - 400 K/uL    nRBC 0.0 0.0 - 0.2 %      Comment: Performed at [facility] Lab, [address] [place], [place] [address]  Glucose, capillary  Status: Abnormal    Collection Time: 03/13/24  6:18 AM  Result Value Ref Range    Glucose-Capillary 122 (H) 70 - 99 mg/dL      Comment: Glucose reference range applies only to samples taken after fasting for at least 8 hours.    Comment 1 Notify RN       Comment 2 Document in Chart    Glucose, capillary     Status: Abnormal    Collection Time: 03/13/24 12:06 PM  Result Value Ref Range    Glucose-Capillary 121 (H) 70 - 99 mg/dL      Comment: Glucose reference range applies only to samples taken after fasting for at least 8 hours.      Imaging Results (Last 48 hours)  No results found.         Blood pressure 129/79, pulse 77, temperature 99.1 F (37.3 C), temperature source Oral, resp. rate 16, height 5' 11 (1.803 m), weight 110.2 kg, SpO2 99%.   Medical Problem List and Plan: 1. Functional deficits secondary to AKA             -patient may shower with limb covered             -ELOS/Goals: 10-14 days, SPV PT/OT   - stable for admission  2.  Antithrombotics: -DVT/anticoagulation:  Pharmaceutical: Xarelto              -antiplatelet therapy:  3. Pain Management: Denies any pain but starting to have phantom limb sensation --tylenol  prn mild pain and oxycodone  prn severe pain.  4. Mood/Behavior/Sleep: LCSW to follow for evaluation and support. Team to provide ego support as has been having a difficult time coming to grips with limb loss             --Ativan  scheduled for insomnia (has helped when given). Very anxious about lack of sleep             -antipsychotic agents: N/A 5. Neuropsych/cognition: This patient is capable of making decisions on his own behalf. 6. Skin/Wound Care: Monitor wound for healing. Juven and vitamin C added 7. Fluids/Electrolytes/Nutrition: Monitor I/O. Daily weights to monitor for signs of overload.  8.  L-AKA for limb ischemia:Monitor for healing. Staple removal appt Jan 6th. 9.  T2DM: w/retinopathy OD: HGb A1c- 8.0. PTA on Jardiance , Lantus  25U bid and humalog  16U TID             --currently on Insulin  glargine 45 units BID (adjusted to home regimen of b'fast and supper) with novolog  10 units TID             --change SSI to moderate as anticipate BS to improve with intensiver rehab program.  10.  GBM  Dx 01/2024: XRT/chemo on hold--continue Keppra  500 mg bid. On Decadron  2 mg daily. Monitor for neurologic symptoms.             --per Radiation Onc noted 12/01-->plans were to keep patient on decadron  2 mg BID and possibly increase over radiation Tx depending on symptoms.  11. H/o A fib/flutter/NSVT: Monitor HR TID and for symptoms with activity             --on Tikosyn , Toprol  and Xarelto  12.  CKD 3a: Baseline SCr @1 .3- 1.5 range for past couple of years             --acute on chronic renal failure improved with IVF and d/c of Jardiance .  13.  Leucocytosis: WBC 14.9 at admission and has been has  been in 13-14 range             --likely due to decadron  for cerebral edema.  --Continue to monitor for fevers and other signs of infection. 14. OSA: Compliant with CPAP use at home but refusing in hospital.  --wife to bring his machine at home which will help with sleep.  15. Acute blood loss anemia: Stable overall 9-10 range.             --monitor for signs of bleeding with Xarelto  on board 16. Situational depression w/anxiety: Lexapro added for mood stabilization.  --Both wife and patient emotional regarding health issues.  17. Class 1 obesity: BMI: 33.8 @ admission-->has been losing weight for past 2-3 months.       Sharlet GORMAN Schmitz, PA-C 03/13/2024  I have examined the patient independently and edited the note for HPI, ROS, exam, assessment, and plan as appropriate. I am in agreement with the above recommendations.   Joesph JAYSON Likes, DO 03/15/2024

## 2024-03-15 NOTE — Progress Notes (Signed)
 Emeline Joesph BROCKS, DO  Physician Physical Medicine and Rehabilitation   PMR Pre-admission    Signed   Date of Service: 03/15/2024  3:53 PM  Related encounter: Admission (Current) from 03/07/2024 in [facility] 4E CV SURGICAL PROGRESSIVE CARE   Signed     Expand All Collapse All  PMR Admission Coordinator Pre-Admission Assessment   Patient: Douglas Hunter is an 68 y.o., male MRN: [id number] DOB: 1956-03-05 Height: 5' 11 (180.3 cm) Weight: 110.2 kg   Insurance Information HMO: yes    PPO:      PCP:      IPA:      80/20:      OTHER:  PRIMARY: [employer] Medicare      Policy#: [id number]       Subscriber: pt CM Name: Maurilio      Phone#: [phone]     Fax#: [phone] Pre-Cert#: [id number]  auth for CIR from Maurilio with [facility] Medicare expedited appeals department for admit 12/12 with next review date 12/18.  Updates due to fax listed above.        Employer:  Benefits:  Phone #: [phone]     Name:  Eff. Date: 04/05/23     Deduct: $0      Out of Pocket Max: $4900 (met $1545.01)      Life Max: n/a CIR: $325/day for days 1-5      SNF: 20 full days Outpatient:      Co-Pay: $20/visit Home Health: 100%      Co-Pay:  DME: 80%     Co-Pay: 20% Providers:  SECONDARY:       Policy#:      Phone#:    Artist:       Phone#:    The Best Boy for patients in Inpatient Rehabilitation Facilities with attached Privacy Act Statement-Health Care Records was provided and verbally reviewed with: Patient and Family   Emergency Contact Information Contact Information       Name Relation Home Work Mobile    [place] W Spouse     [phone]    Eirik, Schueler     [phone]    Nghia, Mcentee     [phone]         Other Contacts   None on File        Current Medical History  Patient Admitting Diagnosis: L AKA, recent crani for glioblastoma   History of Present Illness: Pt is a 68 y/o male with PMH of afib (xarelto ), DM, OSA (cpap), CAD,  MI, RBBB, with 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 Rosslyn, and recent revascularization attempt of LLE on 11/7 who presented to [facility] on 03/07/24 with L foot plan and underwent L AKA per Dr. Magda for non salvageable LLE.  The pt was initially planned to start chemo/radiation but because of the LE arterial disease this is on hold.  To f/u with Dr. [name] and Dr. Ubaldo outpatient.  Hospital course hyperglycemia/DKA due to dexamethasone  for glio.  He did have a fall on 12/5 while attempting to mobilize with family.  Therapy recommendations are for CIR to maximize independence and prefunctional status for glio treatment and reduce caregiver burden.    Patient's medical record from [facility] has been reviewed by the rehabilitation admission coordinator and physician.   Past Medical History      Past Medical History:  Diagnosis Date   AKI (acute kidney injury) 03/13/2020   Atrial flutter (HCC)  Coronary artery disease 08/2011    a. NSTEMI 08/2011, LHC w/ severe disease of LCx with good collaterals, lesion was high-risk for intervention due to a steep angulation of the LCx coming from the left main coronary artery as well as heavy calcifications, discused at cath conference w/ consensus advising med Rx; b. 03/2020 Cath: LM nl, LAD 23m, LCX 100p w/ R->L and L->L collats. RCA dominant, nl. EF 55%. PA 33/6/19, PCWP 8.   GERD (gastroesophageal reflux disease)     H/O brain tumor 01/2024   History of echocardiogram      a. 01/2020 Echo: EF 60-65%, no rwma. Sev elev PASP. Mod dil LA.   Hyperlipidemia     Hypertension     Myocardial infarction [facility])     Paroxysmal atrial fibrillation (HCC)      a. s/p ablation 01/2013; b CHADS2VASc => 3 (HTN, DM, vascular disease); c. on Coumadin ; d. 12/2019 Amio d/c'd 2/2 hyperthyroidism.   Poorly controlled diabetes mellitus (HCC)     RBBB     Sleep apnea      uses nightly          Has the patient had major surgery during  100 days prior to admission? Yes   Family History   family history includes Diabetes in his son; Heart attack in his maternal uncle; Heart attack (age of onset: 47) in his father; Heart disease in his mother and paternal grandfather; Stroke in his mother; Throat cancer in his father.   Current Medications Current Medications    Current Facility-Administered Medications:    acetaminophen  (TYLENOL ) tablet 650 mg, 650 mg, Oral, Q6H PRN **OR** acetaminophen  (TYLENOL ) suppository 650 mg, 650 mg, Rectal, Q6H PRN, Doutova, Anastassia, MD   atorvastatin  (LIPITOR ) tablet 80 mg, 80 mg, Oral, QHS, Doutova, Anastassia, MD, 80 mg at 03/12/24 2156   benazepril  (LOTENSIN ) tablet 20 mg, 20 mg, Oral, Daily, Gonfa, Taye T, MD, 20 mg at 03/12/24 0850   dexamethasone  (DECADRON ) tablet 2 mg, 2 mg, Oral, Daily, Doutova, Anastassia, MD, 2 mg at 03/13/24 9140   dofetilide  (TIKOSYN ) capsule 500 mcg, 500 mcg, Oral, BID, Doutova, Anastassia, MD, 500 mcg at 03/13/24 0858   ezetimibe  (ZETIA ) tablet 10 mg, 10 mg, Oral, Daily, Doutova, Anastassia, MD, 10 mg at 03/13/24 0858   HYDROmorphone  (DILAUDID ) injection 0.5-1 mg, 0.5-1 mg, Intravenous, Q2H PRN, Eveland, Matthew, PA-C, 0.5 mg at 03/08/24 9166   insulin  aspart (novoLOG ) injection 0-20 Units, 0-20 Units, Subcutaneous, TID WC, Gonfa, Taye T, MD, 3 Units at 03/13/24 0646   insulin  aspart (novoLOG ) injection 0-5 Units, 0-5 Units, Subcutaneous, QHS, Gonfa, Taye T, MD, 2 Units at 03/12/24 2155   insulin  aspart (novoLOG ) injection 10 Units, 10 Units, Subcutaneous, TID WC, Gonfa, Taye T, MD, 10 Units at 03/13/24 0858   insulin  glargine (LANTUS ) injection 45 Units, 45 Units, Subcutaneous, BID, Gonfa, Taye T, MD, 45 Units at 03/13/24 0859   levETIRAcetam  (KEPPRA ) tablet 500 mg, 500 mg, Oral, BID, Doutova, Anastassia, MD, 500 mg at 03/13/24 0858   LORazepam  (ATIVAN ) tablet 0.5 mg, 0.5 mg, Oral, Q12H PRN, Gonfa, Taye T, MD, 0.5 mg at 03/12/24 1603   magnesium  sulfate IVPB 1 g 100  mL, 1 g, Intravenous, Once, Pham, Minh Q, RPH-CPP   metoprolol  succinate (TOPROL -XL) 24 hr tablet 25 mg, 25 mg, Oral, Daily, Doutova, Anastassia, MD, 25 mg at 03/13/24 0858   oxyCODONE -acetaminophen  (PERCOCET/ROXICET) 5-325 MG per tablet 1-2 tablet, 1-2 tablet, Oral, Q4H PRN, Eveland, Matthew, PA-C, 1 tablet at 03/08/24 1038  pantoprazole  (PROTONIX ) EC tablet 40 mg, 40 mg, Oral, Daily, Doutova, Anastassia, MD, 40 mg at 03/13/24 0901   rivaroxaban  (XARELTO ) tablet 20 mg, 20 mg, Oral, Q supper, Gonfa, Taye T, MD, 20 mg at 03/12/24 1714   Facility-Administered Medications Ordered in Other Encounters:    sodium chloride  flush (NS) 0.9 % injection 3 mL, 3 mL, Intravenous, Q12H, Visser, Jacquelyn D, PA-C      Patients Current Diet:  Diet Order                  Diet heart healthy/carb modified Room service appropriate? Yes; Fluid consistency: Thin  Diet effective now                         Precautions / Restrictions Precautions Precautions: Fall Precaution/Restrictions Comments: attempted transfer with son 12/5 with near fall Restrictions Weight Bearing Restrictions Per Provider Order: Yes LLE Weight Bearing Per Provider Order: Non weight bearing Other Position/Activity Restrictions: AKA    Has the patient had 2 or more falls or a fall with injury in the past year? Yes   Prior Activity Level Community (5-7x/wk): prior to October independent without DME, since crani has been requiring up to min assist for mobility/ADLs   Prior Functional Level Self Care: Did the patient need help bathing, dressing, using the toilet or eating? Needed some help   Indoor Mobility: Did the patient need assistance with walking from room to room (with or without device)? Needed some help   Stairs: Did the patient need assistance with internal or external stairs (with or without device)? Needed some help   Functional Cognition: Did the patient need help planning regular tasks such as shopping or  remembering to take medications? Needed some help   Patient Information Are you of Hispanic, Latino/a,or Spanish origin?: A. No, not of Hispanic, Latino/a, or Spanish origin What is your race?: A. White Do you need or want an interpreter to communicate with a doctor or health care staff?: 0. No   Patient's Response To:  Health Literacy and Transportation Is the patient able to respond to health literacy and transportation needs?: Yes Health Literacy - How often do you need to have someone help you when you read instructions, pamphlets, or other written material from your doctor or pharmacy?: Never In the past 12 months, has lack of transportation kept you from medical appointments or from getting medications?: No In the past 12 months, has lack of transportation kept you from meetings, work, or from getting things needed for daily living?: No   Journalist, Newspaper / Equipment Home Equipment: Information systems manager - built in, [place] - single point, Crutches, Firefighter, Agricultural Consultant (2 wheels), BSC/3in1   Prior Device Use: Indicate devices/aids used by the patient prior to current illness, exacerbation or injury? Walker   Current Functional Level Cognition   Orientation Level: Oriented X4    Extremity Assessment (includes Sensation/Coordination)   Upper Extremity Assessment: Overall WFL for tasks assessed (Pt reported he was going to have shoulder sx but has been acting good latley)  Lower Extremity Assessment: Defer to PT evaluation LLE Deficits / Details: AKA     ADLs   Overall ADL's : Needs assistance/impaired Eating/Feeding: Set up, Sitting Grooming: Set up, Sitting Upper Body Bathing: Set up, Sitting Lower Body Bathing: Minimal assistance, Sitting/lateral leans Upper Body Dressing : Set up, Sitting Lower Body Dressing: Sitting/lateral leans, Sit to/from stand, Minimal assistance Lower Body Dressing Details (indicate cue type  and reason): does best in recline attempted in bed  and was unable to complete Toilet Transfer: Stand-pivot, Moderate assistance, Rolling walker (2 wheels) Functional mobility during ADLs: Moderate assistance, Rolling walker (2 wheels) General ADL Comments: Educated pt and wife on use of drop arm bariatric BSC for lateral scoots and other compensatory techniques     Mobility   Overal bed mobility: Needs Assistance Bed Mobility: Supine to Sit Supine to sit: Contact guard Sit to supine: Contact guard assist General bed mobility comments: cues for sequencing and increased time to complete     Transfers   Overall transfer level: Needs assistance Equipment used: Rolling walker (2 wheels), None Transfers: Sit to/from Stand, Bed to chair/wheelchair/BSC Sit to Stand: Min assist Bed to/from chair/wheelchair/BSC transfer type:: Lateral/scoot transfer Stand pivot transfers: Contact guard assist, Mod assist Step pivot transfers:  (pt unable to advance foot with poor use of UEs on RW)  Lateral/Scoot Transfers: Contact guard assist General transfer comment: pt standing from EOB x3 with min A with dense cues for hand placement on each stand. Pt able to laterally scoot to L with CGA for safety from slightly elevated EOB     Ambulation / Gait / Stairs / Wheelchair Mobility   Ambulation/Gait General Gait Details: unable Pre-gait activities: Standing with RW, attempting heel raises with poor clearance, working on rotation of foot on heel R/L to prep for being able to the kroger heel/toe on RLE to pivot transfer     Posture / Balance Balance Overall balance assessment: Needs assistance Sitting-balance support: Feet supported Sitting balance-[name] Scale: Good Standing balance support: Bilateral upper extremity supported, Reliant on assistive device for balance, During functional activity Standing balance-[name] Scale: Poor Standing balance comment: Reliant on RW     Special considerations/life events  CPAP, Wound Vac LLE amputation, Skin L AKA site, L  temporal crani site (healing), and Diabetic management yes    Previous Home Environment (from acute therapy documentation) Living Arrangements: Spouse/significant other, Children Available Help at Discharge: Family, Available 24 hours/day Type of Home: House Home Layout: One level Home Access: Stairs to enter, Ramped entrance (Ramp is in progress) Entrance Stairs-Rails: Right, Left Entrance Stairs-Number of Steps: 2 Bathroom Shower/Tub: Health Visitor: Handicapped height Bathroom Accessibility: Yes Home Care Services: No   Discharge Living Setting Plans for Discharge Living Setting: Patient's home, Lives with (comment) (spouse Douglas Hunter) Type of Home at Discharge: House Discharge Home Layout: One level Discharge Home Access: Stairs to enter, Ramped entrance Entrance Stairs-Rails: Right, Left Entrance Stairs-Number of Steps: 2 Discharge Bathroom Shower/Tub: Walk-in shower Discharge Bathroom Toilet: Handicapped height Discharge Bathroom Accessibility: Yes How Accessible: Accessible via walker Does the patient have any problems obtaining your medications?: No   Social/Family/Support Systems Patient Roles: Spouse Anticipated Caregiver: Douglas Hunter Anticipated Caregiver's Contact Information: [phone] Ability/Limitations of Caregiver: none stated Caregiver Availability: 24/7 Discharge Plan Discussed with Primary Caregiver: Yes Is Caregiver In Agreement with Plan?: Yes Does Caregiver/Family have Issues with Lodging/Transportation while Pt is in Rehab?: No   Goals Patient/Family Goal for Rehab: PT/OT supervision, SLP n/a Expected length of stay: 10-14 days Additional Information: Discharge plan: home with spouse 24/7 and son PRN.  Can provide physical assist if needed, but anticipate supervision level goals Pt/Family Agrees to Admission and willing to participate: Yes Program Orientation Provided & Reviewed with Pt/Caregiver Including Roles  & Responsibilities:  Yes Additional Information Needs: Outpatient f/u with rad onc for radiation and onc for systemic treatment of glio (confirmed on 12/8) Information Needs to be Provided  By: Dr. [name] (rad/onc), Dr. Lenn (onc)   Decrease burden of Care through IP rehab admission: na/   Possible need for SNF placement upon discharge: Not anticipated.  Plan for discharge home with spouse and son providing 24/7 support as prior to admit.     Patient Condition: This patient's medical and functional status has changed since the consult dated: 03/11/2024 in which the Rehabilitation Physician determined and documented that the patient's condition is appropriate for intensive rehabilitative care in an inpatient rehabilitation facility. See History of Present Illness (above) for medical update. Functional changes are: pt is min assist for limited mobility and ADLs, has not been able to progress with ambulation. Patient's medical and functional status update has been discussed with the Rehabilitation physician and patient remains appropriate for inpatient rehabilitation. Will admit to inpatient rehab today.   Preadmission Screen Completed By:  Reche FORBES Lowers, PT, DPT 03/13/2024 9:13 AM ______________________________________________________________________   Discussed status with Dr. Emeline on 03/13/24  at 9:20 AM  and received approval for admission today.   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 Yes Co-Morbidities requiring supervision/potential complications:  - Glioblastoma status postresection awaiting chemotherapy and radiation therapy, uncontrolled diabetes Due to bladder management, bowel management,  safety, skin/wound care, disease management, medication administration, pain management, and patient education, does the patient require 24 hr/day rehab nursing? Yes Does the patient require coordinated care of a physician, rehab nurse, therapy disciplines of PT, OT to address physical and functional deficits in the context of the above medical diagnosis(es)? Yes Addressing deficits in the following areas: balance, endurance, locomotion, strength, transferring, bowel/bladder control, bathing, dressing, feeding, grooming, toileting, and psychosocial support Can the patient actively participate in an intensive therapy program of at least 3 hrs of therapy per day at least 5 days per week? Yes The potential for patient to make measurable gains while on inpatient rehab is good Anticipated functional outcomes upon discharge from inpatient rehab are modified independent and supervision  with PT, modified independent and supervision with OT, n/a with SLP. Estimated rehab length of stay to reach the above functional goals is: 10 to 14 days Anticipated discharge destination: Home Overall Rehab/Functional Prognosis: good     MD Signature:   Joesph JAYSON Emeline, DO 03/15/2024           Revision History  Date/Time User Provider Type Action  03/15/2024  4:05 PM Emeline Joesph JAYSON, DO Physician Sign  03/15/2024  3:53 PM Lowers Reche FORBES, PT Rehab Admission Coordinator Share  03/15/2024  2:18 PM Lowers Reche FORBES, PT Rehab Admission Coordinator Share  03/13/2024  9:21 AM Lowers Reche FORBES, PT Rehab Admission Coordinator Share   View Details Report

## 2024-03-16 DIAGNOSIS — D72829 Elevated white blood cell count, unspecified: Secondary | ICD-10-CM

## 2024-03-16 DIAGNOSIS — E1165 Type 2 diabetes mellitus with hyperglycemia: Secondary | ICD-10-CM

## 2024-03-16 DIAGNOSIS — Z794 Long term (current) use of insulin: Secondary | ICD-10-CM

## 2024-03-16 DIAGNOSIS — D62 Acute posthemorrhagic anemia: Secondary | ICD-10-CM

## 2024-03-16 DIAGNOSIS — Z89612 Acquired absence of left leg above knee: Secondary | ICD-10-CM | POA: Diagnosis not present

## 2024-03-16 LAB — COMPREHENSIVE METABOLIC PANEL WITH GFR
ALT: 24 U/L (ref 0–44)
AST: 23 U/L (ref 15–41)
Albumin: 2.5 g/dL — ABNORMAL LOW (ref 3.5–5.0)
Alkaline Phosphatase: 92 U/L (ref 38–126)
Anion gap: 9 (ref 5–15)
BUN: 26 mg/dL — ABNORMAL HIGH (ref 8–23)
CO2: 22 mmol/L (ref 22–32)
Calcium: 8.6 mg/dL — ABNORMAL LOW (ref 8.9–10.3)
Chloride: 104 mmol/L (ref 98–111)
Creatinine, Ser: 1.03 mg/dL (ref 0.61–1.24)
GFR, Estimated: >60 mL/min (ref >60)
Glucose, Bld: 151 mg/dL — ABNORMAL HIGH (ref 70–99)
Potassium: 3.8 mmol/L (ref 3.5–5.1)
Sodium: 135 mmol/L (ref 135–145)
Total Bilirubin: 0.7 mg/dL (ref 0.0–1.2)
Total Protein: 6.2 g/dL — ABNORMAL LOW (ref 6.5–8.1)

## 2024-03-16 LAB — CBC WITH DIFFERENTIAL/PLATELET
Abs Immature Granulocytes: 0.16 K/uL — ABNORMAL HIGH (ref 0.00–0.07)
Basophils Absolute: 0.1 K/uL (ref 0.0–0.1)
Basophils Relative: 1 %
Eosinophils Absolute: 0.1 K/uL (ref 0.0–0.5)
Eosinophils Relative: 1 %
HCT: 34.4 % — ABNORMAL LOW (ref 39.0–52.0)
Hemoglobin: 11.5 g/dL — ABNORMAL LOW (ref 13.0–17.0)
Immature Granulocytes: 1 %
Lymphocytes Relative: 27 %
Lymphs Abs: 3.6 K/uL (ref 0.7–4.0)
MCH: 31 pg (ref 26.0–34.0)
MCHC: 33.4 g/dL (ref 30.0–36.0)
MCV: 92.7 fL (ref 80.0–100.0)
Monocytes Absolute: 1 K/uL (ref 0.1–1.0)
Monocytes Relative: 8 %
Neutro Abs: 8.4 K/uL — ABNORMAL HIGH (ref 1.7–7.7)
Neutrophils Relative %: 62 %
Platelets: 510 K/uL — ABNORMAL HIGH (ref 150–400)
RBC: 3.71 MIL/uL — ABNORMAL LOW (ref 4.22–5.81)
RDW: 14.5 % (ref 11.5–15.5)
WBC: 13.4 K/uL — ABNORMAL HIGH (ref 4.0–10.5)
nRBC: 0 % (ref 0.0–0.2)

## 2024-03-16 MED ORDER — MELATONIN 5 MG PO TABS
5.0000 mg | ORAL_TABLET | Freq: Every day | ORAL | Status: DC
  Administered 2024-03-16 – 2024-03-25 (×10): 5 mg via ORAL
  Filled 2024-03-16 (×10): qty 1

## 2024-03-16 MED ORDER — MELATONIN 5 MG PO TABS: 5.0000 mg | ORAL_TABLET | Freq: Every day | ORAL | Status: DC

## 2024-03-16 MED ORDER — POTASSIUM CHLORIDE CRYS ER 20 MEQ PO TBCR
40.0000 meq | EXTENDED_RELEASE_TABLET | Freq: Once | ORAL | Status: AC
  Administered 2024-03-16: 40 meq via ORAL
  Filled 2024-03-16: qty 2

## 2024-03-16 NOTE — Plan of Care (Signed)
°  Problem: RH Balance Goal: LTG Patient will maintain dynamic sitting balance (PT) Description: LTG:  Patient will maintain dynamic sitting balance with assistance during mobility activities (PT) Flowsheets (Taken 03/16/2024 1708) LTG: Pt will maintain dynamic sitting balance during mobility activities with:: Independent with assistive device  Goal: LTG Patient will maintain dynamic standing balance (PT) Description: LTG:  Patient will maintain dynamic standing balance with assistance during mobility activities (PT) Flowsheets (Taken 03/16/2024 1708) LTG: Pt will maintain dynamic standing balance during mobility activities with:: Supervision/Verbal cueing   Problem: Sit to Stand Goal: LTG:  Patient will perform sit to stand with assistance level (PT) Description: LTG:  Patient will perform sit to stand with assistance level (PT) Flowsheets (Taken 03/16/2024 1708) LTG: PT will perform sit to stand in preparation for functional mobility with assistance level: Supervision/Verbal cueing   Problem: RH Bed Mobility Goal: LTG Patient will perform bed mobility with assist (PT) Description: LTG: Patient will perform bed mobility with assistance, with/without cues (PT). Flowsheets (Taken 03/16/2024 1708) LTG: Pt will perform bed mobility with assistance level of: Independent with assistive device    Problem: RH Bed to Chair Transfers Goal: LTG Patient will perform bed/chair transfers w/assist (PT) Description: LTG: Patient will perform bed to chair transfers with assistance (PT). Flowsheets (Taken 03/16/2024 1708) LTG: Pt will perform Bed to Chair Transfers with assistance level: Independent with assistive device    Problem: RH Car Transfers Goal: LTG Patient will perform car transfers with assist (PT) Description: LTG: Patient will perform car transfers with assistance (PT). Flowsheets (Taken 03/16/2024 1708) LTG: Pt will perform car transfers with assist:: Supervision/Verbal cueing    Problem: RH Ambulation Goal: LTG Patient will ambulate in home environment (PT) Description: LTG: Patient will ambulate in home environment, # of feet with assistance (PT). Flowsheets (Taken 03/16/2024 1708) LTG: Pt will ambulate in home environ  assist needed:: Supervision/Verbal cueing LTG: Ambulation distance in home environment: 25 ft using LRAD   Problem: RH Wheelchair Mobility Goal: LTG Patient will propel w/c in controlled environment (PT) Description: LTG: Patient will propel wheelchair in controlled environment, # of feet with assist (PT) Flowsheets (Taken 03/16/2024 1708) LTG: Pt will propel w/c in controlled environ  assist needed:: Independent with assistive device LTG: Propel w/c distance in controlled environment: 150 ft Goal: LTG Patient will propel w/c in home environment (PT) Description: LTG: Patient will propel wheelchair in home environment, # of feet with assistance (PT). Flowsheets (Taken 03/16/2024 1708) LTG: Pt will propel w/c in home environ  assist needed:: Independent with assistive device Distance: wheelchair distance in controlled environment: 50

## 2024-03-16 NOTE — Evaluation (Addendum)
 Occupational Therapy Assessment and Plan  Patient Details  Name: Douglas Hunter MRN: [id number] Date of Birth: Apr 27, 1955  OT Diagnosis: acute pain, muscle weakness (generalized), pain in joint, and amputation Rehab Potential: Rehab Potential (ACUTE ONLY): Good ELOS: 10-14 days   Today's Date: 03/16/2024 OT Individual Time: 8952-8779 OT Individual Time Calculation (min): 93 min     Hospital Problem: Principal Problem:   Critical limb ischemia of left lower extremity (HCC)   Past Medical History:  Past Medical History:  Diagnosis Date   AKI (acute kidney injury) 03/13/2020   Atrial flutter (HCC)    Coronary artery disease 08/2011   a. NSTEMI 08/2011, LHC w/ severe disease of LCx with good collaterals, lesion was high-risk for intervention due to a steep angulation of the LCx coming from the left main coronary artery as well as heavy calcifications, discused at cath conference w/ consensus advising med Rx; b. 03/2020 Cath: LM nl, LAD 9m, LCX 100p w/ R->L and L->L collats. RCA dominant, nl. EF 55%. PA 33/6/19, PCWP 8.   GERD (gastroesophageal reflux disease)    H/O brain tumor 01/2024   History of echocardiogram    a. 01/2020 Echo: EF 60-65%, no rwma. Sev elev PASP. Mod dil LA.   Hyperlipidemia    Hypertension    Myocardial infarction [facility])    Paroxysmal atrial fibrillation (HCC)    a. s/p ablation 01/2013; b CHADS2VASc => 3 (HTN, DM, vascular disease); c. on Coumadin ; d. 12/2019 Amio d/c'd 2/2 hyperthyroidism.   Poorly controlled diabetes mellitus (HCC)    RBBB    Sleep apnea    uses nightly   Past Surgical History:  Past Surgical History:  Procedure Laterality Date   ABLATION  01/29/2013   PVI and CTI by Dr Kelsie for atrial flutter and paroxysmal atrial fibrillation   AMPUTATION Left 03/07/2024   Procedure: AMPUTATION, ABOVE KNEE;  Surgeon: Magda Debby SAILOR, MD;  Location: [facility] OR;  Service: Vascular;  Laterality: Left;   ANTERIOR CERVICAL DECOMP/DISCECTOMY FUSION N/A  04/18/2018   Procedure: ANTERIOR CERVICAL DECOMPRESSION/DISCECTOMY FUSION 2 LEVELS C5-7;  Surgeon: Clois Fret, MD;  Location: [facility] ORS;  Service: Neurosurgery;  Laterality: N/A;   APPLICATION OF CRANIAL NAVIGATION Left 01/29/2024   Procedure: COMPUTER-ASSISTED NAVIGATION, FOR CRANIAL PROCEDURE;  Surgeon: Rosslyn Dino HERO, MD;  Location: [facility] OR;  Service: Neurosurgery;  Laterality: Left;   APPLICATION OF WOUND VAC Left 03/07/2024   Procedure: APPLICATION, WOUND VAC;  Surgeon: Magda Debby SAILOR, MD;  Location: [facility] OR;  Service: Vascular;  Laterality: Left;   ATRIAL FIBRILLATION ABLATION N/A 01/29/2013   Procedure: ATRIAL FIBRILLATION ABLATION;  Surgeon: Lynwood JONETTA Kelsie, MD;  Location: [facility] CATH LAB;  Service: Cardiovascular;  Laterality: N/A;   ATRIAL FIBRILLATION ABLATION N/A 11/15/2021   Procedure: ATRIAL FIBRILLATION ABLATION;  Surgeon: Cindie Ole DASEN, MD;  Location: [facility] INVASIVE CV LAB;  Service: Cardiovascular;  Laterality: N/A;   CARDIAC CATHETERIZATION  08/03/2011   COLONOSCOPY WITH PROPOFOL  N/A 02/02/2016   Procedure: COLONOSCOPY WITH PROPOFOL ;  Surgeon: Rogelia Copping, MD;  Location: [facility] ENDOSCOPY;  Service: Endoscopy;  Laterality: N/A;   COLONOSCOPY WITH PROPOFOL  N/A 04/20/2023   Procedure: COLONOSCOPY WITH PROPOFOL ;  Surgeon: Copping Rogelia, MD;  Location: [facility] ENDOSCOPY;  Service: Endoscopy;  Laterality: N/A;   CRANIOTOMY Left 01/29/2024   Procedure: CRANIOTOMY TUMOR EXCISION;  Surgeon: Rosslyn Dino HERO, MD;  Location: [facility] OR;  Service: Neurosurgery;  Laterality: Left;  LEFT TEMPORAL MASS RESCETION   ENDARTERECTOMY FEMORAL Left 02/09/2024   Procedure: LEFT FEMORAL ENDARTERECTOMY;  Surgeon: Magda Debby SAILOR, MD;  Location: [facility] OR;  Service: Vascular;  Laterality: Left;   FASCIECTOMY, LOWER EXTREMITY Left 02/09/2024   Procedure: 4 COMPARTMENT FASCIECTOMY, LEFT LOWER EXTREMITY;  Surgeon: Magda Debby SAILOR, MD;  Location: [facility] OR;  Service: Vascular;  Laterality: Left;   LOWER EXTREMITY ANGIOGRAM Left  02/09/2024   Procedure: INTRAOPERATIVE LEFT LOWER EXTREMITY ANGIOGRAM;  Surgeon: Magda Debby SAILOR, MD;  Location: [facility] OR;  Service: Vascular;  Laterality: Left;   LUMBAR LAMINECTOMY/DECOMPRESSION MICRODISCECTOMY N/A 06/07/2017   Procedure: LUMBAR LAMINECTOMY/DECOMPRESSION MICRODISCECTOMY 2 LEVELS-L3-4,L4-5;  Surgeon: Clois Fret, MD;  Location: [facility] ORS;  Service: Neurosurgery;  Laterality: N/A;   PATCH ANGIOPLASTY  02/09/2024   Procedure: ANGIOPLASTY LEFT FEMORAL ARTERY USING XENOSURE BOVINE PERICARDIUM PATCH;  Surgeon: Magda Debby SAILOR, MD;  Location: [facility] OR;  Service: Vascular;;   POLYPECTOMY  04/20/2023   Procedure: POLYPECTOMY;  Surgeon: Jinny Carmine, MD;  Location: [facility] ENDOSCOPY;  Service: Endoscopy;;   RIGHT/LEFT HEART CATH AND CORONARY ANGIOGRAPHY N/A 03/06/2020   Procedure: RIGHT/LEFT HEART CATH AND CORONARY ANGIOGRAPHY;  Surgeon: Perla Evalene PARAS, MD;  Location: [facility] INVASIVE CV LAB;  Service: Cardiovascular;  Laterality: N/A;   TEE WITHOUT CARDIOVERSION N/A 01/28/2013   Procedure: TRANSESOPHAGEAL ECHOCARDIOGRAM (TEE);  Surgeon: Redell GORMAN Shallow, MD;  Location: [facility] ENDOSCOPY;  Service: Cardiovascular;  Laterality: N/A;   THROMBECTOMY OF BYPASS GRAFT FEMORAL- PERONEAL ARTERY Left 02/09/2024   Procedure: THROMBECTOMY OF LEFT FEMORAL AND POTERIOR TIBIAL  ARTERY;  Surgeon: Magda Debby SAILOR, MD;  Location: [facility] OR;  Service: Vascular;  Laterality: Left;    Assessment & Plan Clinical Impression: Patient is a 68 y.o. 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 He was admitted for thrombectomy of left femoral, femorotibial and posterior tibial thrombectomy with 4 compartment fasciotomy by Dr. Magda and discharged on Xarelto . He continued to have LLE pain affecting WB as well as sleep, dehiscence of fasciotomy wound and new  interdigit and limb felt to be unsalvageable . He was admitted on 03/07/24 for L-AKA by Dr. Magda. Post op placed on heparin  drip while H/H was being monitored and stable in 9-10 range so transitioned to Xarelto    He has had issues with acute on chronic renal failure and was treated with IVF for hydration and jardiance  d/c. BS have been labile and insulin  glargline titrated up for tighter BS coverage. Wound VAC removed yesterday and wound reported to be healing well. Pain control improving but noted have adjustment reaction due to limb loss. PT/OT has been working with patient who requires multimodal cues with activity, min assist with SPT as well as cues for AE use and min to mod assist with ADL tasks. He was independent PTA and awaiting start of XRT/Chemo. CIR recommended due to functional decline.   Patient currently requires max with basic self-care skills secondary to muscle weakness, decreased cardiorespiratoy endurance, and decreased standing balance, decreased postural control, decreased balance strategies, and difficulty maintaining precautions.  Prior to hospitalization, patient could complete IADLS, ADLs with modified independent .  Patient will benefit from skilled intervention to decrease level of assist with basic self-care skills and increase independence with basic self-care skills prior to discharge home with care partner.  Anticipate patient will require 24 hour supervision and follow up home health.  OT - End of Session Activity Tolerance: Tolerates 30+ min  activity with multiple rests OT Assessment Rehab Potential (ACUTE ONLY): Good OT Barriers to Discharge: Decreased caregiver support OT Barriers to Discharge Comments: decreased caregiver support for current level of assist needed OT Patient demonstrates impairments in the following area(s): Balance;Perception;Safety;Cognition;Behavior;Skin Integrity OT Basic ADL's Functional Problem(s): Dressing;Bathing;Toileting OT Transfers  Functional Problem(s): Toilet OT Additional Impairment(s): None OT Plan OT Intensity: Minimum of 1-2 x/day, 45 to 90 minutes OT Frequency: 5 out of 7 days OT Treatment/Interventions: Balance/vestibular training;Discharge planning;Pain management;Self Care/advanced ADL retraining;Therapeutic Activities;UE/LE Coordination activities;Wheelchair propulsion/positioning;Visual/perceptual remediation/compensation;Therapeutic Exercise;Patient/family education;Skin care/wound managment;Functional mobility training;Disease mangement/prevention;Cognitive remediation/compensation;Community reintegration;DME/adaptive equipment instruction;Neuromuscular re-education;Psychosocial support;UE/LE Strength taining/ROM OT Self Feeding Anticipated Outcome(s): mod I OT Basic Self-Care Anticipated Outcome(s): SUP OT Toileting Anticipated Outcome(s): CGA OT Bathroom Transfers Anticipated Outcome(s): SUP OT Recommendation Recommendations for Other Services: None Patient destination: Home Follow Up Recommendations: Home health OT Equipment Recommended: 3 in 1 bedside comode;Rolling walker with 5 wheels;Sliding board Equipment Details: RW vs slide board for wc   OT Evaluation Precautions/Restrictions  Precautions Precautions: Fall Recall of Precautions/Restrictions: Intact Restrictions Weight Bearing Restrictions Per Provider Order: Yes LLE Weight Bearing Per Provider Order: Non weight bearing Other Position/Activity Restrictions: AKA General Chart Reviewed: Yes Response to Previous Treatment: Not applicable Family/Caregiver Present: Yes Vital Signs Therapy Vitals Pulse Rate: 60 BP: (!) 146/66 Pain Pain Assessment Pain Scale: 0-10 Pain Score: 0-No pain Home Living/Prior Functioning Home Living Family/patient expects to be discharged to:: Private residence Living Arrangements: Spouse/significant other Available Help at Discharge: Family, Available 24 hours/day Type of Home: House Home Access:  Stairs to enter, Ramped entrance Secretary/administrator of Steps: 2 Entrance Stairs-Rails: Right, Left Home Layout: One level Bathroom Shower/Tub: Health Visitor: Handicapped height Bathroom Accessibility: Yes  Lives With: Spouse IADL History Homemaking Responsibilities: Yes Current License: Yes Mode of Transportation: Set Designer Occupation: Retired Prior Function Level of Independence: Independent with gait, Independent with transfers, Independent with homemaking with ambulation  Able to Take Stairs?: Yes Driving: Yes Vocation: Retired Administrator, Sports Baseline Vision/History: 1 Wears glasses Ability to See in Adequate Light: 0 Adequate Patient Visual Report: No change from baseline Vision Assessment?: Wears glasses for reading Perception  Perception: Within Functional Limits Praxis Praxis: WFL Cognition Cognition Overall Cognitive Status: Within Functional Limits for tasks assessed Arousal/Alertness: Awake/alert Memory: Appears intact Awareness: Appears intact Problem Solving: Impaired Safety/Judgment: Appears intact Brief Interview for Mental Status (BIMS) Repetition of Three Words (First Attempt): 3 Temporal Orientation: Year: Correct Temporal Orientation: Month: Accurate within 5 days Temporal Orientation: Day: Correct Recall: Sock: Yes, no cue required Recall: Blue: Yes, no cue required Recall: Bed: Yes, after cueing (a piece of furniture) BIMS Summary Score: 14 Sensation Sensation Light Touch: Impaired by gross assessment (Simultaneous filing. User may not have seen previous data.) Proprioception: Appears Intact Coordination Gross Motor Movements are Fluid and Coordinated: No (Simultaneous filing. User may not have seen previous data.) Fine Motor Movements are Fluid and Coordinated: Yes Finger Nose Finger Test: [facility] Motor  Motor Motor: Within Functional Limits  Trunk/Postural Assessment  Cervical Assessment Cervical Assessment: Within Functional  Limits Thoracic Assessment Thoracic Assessment: Within Functional Limits Lumbar Assessment Lumbar Assessment: Within Functional Limits  Balance Balance Balance Assessed: Yes Dynamic Sitting Balance Dynamic Sitting - Balance Support: Feet supported Dynamic Sitting - Level of Assistance: 5: Stand by assistance Static Standing Balance Static Standing - Balance Support: Bilateral upper extremity supported Static Standing - Level of Assistance: 5: Stand by assistance Dynamic Standing Balance Dynamic Standing - Balance Support: Right upper extremity supported Dynamic Standing - Level  of Assistance: 4: Min assist Dynamic Standing - Balance Activities: Lateral lean/weight shifting Extremity/Trunk Assessment RUE Assessment RUE Assessment: Within Functional Limits Active Range of Motion (AROM) Comments: WFL LUE Assessment LUE Assessment: Within Functional Limits Active Range of Motion (AROM) Comments: WFL General Strength Comments: was going to recieve rotator cuff surgery before LE issues, however states is not currently bothering him  Care Tool Care Tool Self Care Eating   Eating Assist Level: Set up assist    Oral Care    Oral Care Assist Level: Supervision/Verbal cueing    Bathing   Body parts bathed by patient: Right arm;Left upper leg;Left arm;Chest;Abdomen;Face;Front perineal area;Buttocks;Right lower leg   Body parts n/a: Left lower leg Assist Level: Moderate Assistance - Patient 50 - 74%    Upper Body Dressing(including orthotics)       Assist Level: Supervision/Verbal cueing    Lower Body Dressing (excluding footwear)     Assist for lower body dressing: Maximal Assistance - Patient 25 - 49%    Putting on/Taking off footwear     Assist for footwear: Total Assistance - Patient < 25%       Care Tool Toileting Toileting activity   Assist for toileting: Total Assistance - Patient < 25%     Care Tool Bed Mobility Roll left and right activity   Roll left and  right assist level: Contact Guard/Touching assist    Sit to lying activity   Sit to lying assist level: Minimal Assistance - Patient > 75%    Lying to sitting on side of bed activity   Lying to sitting on side of bed assist level: the ability to move from lying on the back to sitting on the side of the bed with no back support.: Minimal Assistance - Patient > 75%     Care Tool Transfers Sit to stand transfer   Sit to stand assist level: Minimal Assistance - Patient > 75%    Chair/bed transfer   Chair/bed transfer assist level: Minimal Assistance - Patient > 75%     Toilet transfer   Assist Level: Minimal Assistance - Patient > 75%     Care Tool Cognition  Expression of Ideas and Wants Expression of Ideas and Wants: 4. Without difficulty (complex and basic) - expresses complex messages without difficulty and with speech that is clear and easy to understand  Understanding Verbal and Non-Verbal Content Understanding Verbal and Non-Verbal Content: 4. Understands (complex and basic) - clear comprehension without cues or repetitions   Memory/Recall Ability Memory/Recall Ability : Current season;Staff names and faces;That he or she is in a hospital/hospital unit;Location of own room   Refer to Care Plan for Long Term Goals  SHORT TERM GOAL WEEK 1 OT Short Term Goal 1 (Week 1): patient will complete LB dressing with mod A OT Short Term Goal 2 (Week 1): patient will complete toileting with LRAD with mod A OT Short Term Goal 3 (Week 1): patient will complete transfers with overall CGA  Recommendations for other services: None    Skilled Therapeutic Intervention Evaluation completed (see details above) with education on OT POC and goals and individual treatment initiated with focus on functional mobility/transfers, ADL re-training,  generalized strengthening and endurance, dynamic standing balance/coordination, pt/family education and discharge planning. Pt education provided on therapy  schedule and safety policy with use of chair alarm. Pt participated in goal setting. Pt participated in modified ADL task (See above for functional performance).  Patient able to complete bathing in shower with mod  A from OT for LB bathing. Patient trailed stand pivot transfers, squat pivot transfers , and lateral transfers. Patient reported increased confidence with squat pivot and lateral transfers, however understands the importance of standing for pressure relief. Patient then able to complete. Patient and spouse educated on importance of OOB, pressure relief, weight shifting. Patient left in bed with all needs in reach.     ADL ADL Eating: Modified independent Where Assessed-Eating: Wheelchair Grooming: Setup Where Assessed-Grooming: Sitting at sink Upper Body Bathing: Supervision/safety Where Assessed-Upper Body Bathing: Shower Lower Body Bathing: Moderate assistance Where Assessed-Lower Body Bathing: Shower Upper Body Dressing: Supervision/safety Where Assessed-Upper Body Dressing: Wheelchair Lower Body Dressing: Maximal assistance Where Assessed-Lower Body Dressing: Wheelchair Toileting: Maximal assistance Where Assessed-Toileting: Teacher, Adult Education: Minimal Dentist Method: Other (comment) (lateral/ squat pivot) Acupuncturist: Extra wide bedside commode;Drop arm bedside commode Film/video Editor: Minimal assistance Film/video Editor Method: Administrator: Transfer tub bench Mobility  Bed Mobility Bed Mobility: Rolling Right;Rolling Left;Sit to Supine;Supine to Sit Rolling Right: Contact Guard/Touching assist Rolling Left: Contact Guard/Touching assist Supine to Sit: Minimal Assistance - Patient > 75% Sit to Supine: Minimal Assistance - Patient > 75% Transfers Sit to Stand: Minimal Assistance - Patient > 75% Stand to Sit: Minimal Assistance - Patient > 75%   Discharge Criteria: Patient will be  discharged from OT if patient refuses treatment 3 consecutive times without medical reason, if treatment goals not met, if there is a change in medical status, if patient makes no progress towards goals or if patient is discharged from hospital.  The above assessment, treatment plan, treatment alternatives and goals were discussed and mutually agreed upon: by patient  D'mariea L Murrell Elizondo 03/16/2024, 1:24 PM

## 2024-03-16 NOTE — Evaluation (Signed)
 Physical Therapy Assessment and Plan  Patient Details  Name: Douglas Hunter MRN: [id number] Date of Birth: 1955-09-11  PT Diagnosis: Abnormality of gait, Edema, and Muscle weakness Rehab Potential: Good ELOS: 7-10 days   Today's Date: 03/16/2024 PT Individual Time: 1300-1415 PT Individual Time Calculation (min): 75 min    Hospital Problem: Principal Problem:   Critical limb ischemia of left lower extremity (HCC)   Past Medical History:  Past Medical History:  Diagnosis Date   AKI (acute kidney injury) 03/13/2020   Atrial flutter (HCC)    Coronary artery disease 08/2011   a. NSTEMI 08/2011, LHC w/ severe disease of LCx with good collaterals, lesion was high-risk for intervention due to a steep angulation of the LCx coming from the left main coronary artery as well as heavy calcifications, discused at cath conference w/ consensus advising med Rx; b. 03/2020 Cath: LM nl, LAD 20m, LCX 100p w/ R->L and L->L collats. RCA dominant, nl. EF 55%. PA 33/6/19, PCWP 8.   GERD (gastroesophageal reflux disease)    H/O brain tumor 01/2024   History of echocardiogram    a. 01/2020 Echo: EF 60-65%, no rwma. Sev elev PASP. Mod dil LA.   Hyperlipidemia    Hypertension    Myocardial infarction [facility])    Paroxysmal atrial fibrillation (HCC)    a. s/p ablation 01/2013; b CHADS2VASc => 3 (HTN, DM, vascular disease); c. on Coumadin ; d. 12/2019 Amio d/c'd 2/2 hyperthyroidism.   Poorly controlled diabetes mellitus (HCC)    RBBB    Sleep apnea    uses nightly   Past Surgical History:  Past Surgical History:  Procedure Laterality Date   ABLATION  01/29/2013   PVI and CTI by Dr Kelsie for atrial flutter and paroxysmal atrial fibrillation   AMPUTATION Left 03/07/2024   Procedure: AMPUTATION, ABOVE KNEE;  Surgeon: Magda Debby SAILOR, MD;  Location: [facility] OR;  Service: Vascular;  Laterality: Left;   ANTERIOR CERVICAL DECOMP/DISCECTOMY FUSION N/A 04/18/2018   Procedure: ANTERIOR CERVICAL DECOMPRESSION/DISCECTOMY  FUSION 2 LEVELS C5-7;  Surgeon: Clois Fret, MD;  Location: [facility] ORS;  Service: Neurosurgery;  Laterality: N/A;   APPLICATION OF CRANIAL NAVIGATION Left 01/29/2024   Procedure: COMPUTER-ASSISTED NAVIGATION, FOR CRANIAL PROCEDURE;  Surgeon: Rosslyn Dino HERO, MD;  Location: [facility] OR;  Service: Neurosurgery;  Laterality: Left;   APPLICATION OF WOUND VAC Left 03/07/2024   Procedure: APPLICATION, WOUND VAC;  Surgeon: Magda Debby SAILOR, MD;  Location: [facility] OR;  Service: Vascular;  Laterality: Left;   ATRIAL FIBRILLATION ABLATION N/A 01/29/2013   Procedure: ATRIAL FIBRILLATION ABLATION;  Surgeon: Lynwood JONETTA Kelsie, MD;  Location: [facility] CATH LAB;  Service: Cardiovascular;  Laterality: N/A;   ATRIAL FIBRILLATION ABLATION N/A 11/15/2021   Procedure: ATRIAL FIBRILLATION ABLATION;  Surgeon: Cindie Ole DASEN, MD;  Location: [facility] INVASIVE CV LAB;  Service: Cardiovascular;  Laterality: N/A;   CARDIAC CATHETERIZATION  08/03/2011   COLONOSCOPY WITH PROPOFOL  N/A 02/02/2016   Procedure: COLONOSCOPY WITH PROPOFOL ;  Surgeon: Rogelia Copping, MD;  Location: [facility] ENDOSCOPY;  Service: Endoscopy;  Laterality: N/A;   COLONOSCOPY WITH PROPOFOL  N/A 04/20/2023   Procedure: COLONOSCOPY WITH PROPOFOL ;  Surgeon: Copping Rogelia, MD;  Location: [facility] ENDOSCOPY;  Service: Endoscopy;  Laterality: N/A;   CRANIOTOMY Left 01/29/2024   Procedure: CRANIOTOMY TUMOR EXCISION;  Surgeon: Rosslyn Dino HERO, MD;  Location: [facility] OR;  Service: Neurosurgery;  Laterality: Left;  LEFT TEMPORAL MASS RESCETION   ENDARTERECTOMY FEMORAL Left 02/09/2024   Procedure: LEFT FEMORAL ENDARTERECTOMY;  Surgeon: Magda Debby SAILOR, MD;  Location:  [facility] OR;  Service: Vascular;  Laterality: Left;   FASCIECTOMY, LOWER EXTREMITY Left 02/09/2024   Procedure: 4 COMPARTMENT FASCIECTOMY, LEFT LOWER EXTREMITY;  Surgeon: Magda Debby SAILOR, MD;  Location: [facility] OR;  Service: Vascular;  Laterality: Left;   LOWER EXTREMITY ANGIOGRAM Left 02/09/2024   Procedure: INTRAOPERATIVE LEFT LOWER EXTREMITY  ANGIOGRAM;  Surgeon: Magda Debby SAILOR, MD;  Location: [facility] OR;  Service: Vascular;  Laterality: Left;   LUMBAR LAMINECTOMY/DECOMPRESSION MICRODISCECTOMY N/A 06/07/2017   Procedure: LUMBAR LAMINECTOMY/DECOMPRESSION MICRODISCECTOMY 2 LEVELS-L3-4,L4-5;  Surgeon: Clois Fret, MD;  Location: [facility] ORS;  Service: Neurosurgery;  Laterality: N/A;   PATCH ANGIOPLASTY  02/09/2024   Procedure: ANGIOPLASTY LEFT FEMORAL ARTERY USING XENOSURE BOVINE PERICARDIUM PATCH;  Surgeon: Magda Debby SAILOR, MD;  Location: [facility] OR;  Service: Vascular;;   POLYPECTOMY  04/20/2023   Procedure: POLYPECTOMY;  Surgeon: Jinny Carmine, MD;  Location: [facility] ENDOSCOPY;  Service: Endoscopy;;   RIGHT/LEFT HEART CATH AND CORONARY ANGIOGRAPHY N/A 03/06/2020   Procedure: RIGHT/LEFT HEART CATH AND CORONARY ANGIOGRAPHY;  Surgeon: Perla Evalene PARAS, MD;  Location: [facility] INVASIVE CV LAB;  Service: Cardiovascular;  Laterality: N/A;   TEE WITHOUT CARDIOVERSION N/A 01/28/2013   Procedure: TRANSESOPHAGEAL ECHOCARDIOGRAM (TEE);  Surgeon: Redell GORMAN Shallow, MD;  Location: [facility] ENDOSCOPY;  Service: Cardiovascular;  Laterality: N/A;   THROMBECTOMY OF BYPASS GRAFT FEMORAL- PERONEAL ARTERY Left 02/09/2024   Procedure: THROMBECTOMY OF LEFT FEMORAL AND POTERIOR TIBIAL  ARTERY;  Surgeon: Magda Debby SAILOR, MD;  Location: [facility] OR;  Service: Vascular;  Laterality: Left;    Assessment & Plan Clinical Impression: Patient is a 68 y.o. 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 He was admitted for thrombectomy of left femoral, femorotibial and posterior tibial thrombectomy with 4 compartment fasciotomy by Dr. Magda and discharged on Xarelto . He continued to have LLE pain affecting WB as well as sleep, dehiscence of fasciotomy wound and new interdigit and limb felt to be unsalvageable . He  was admitted on 03/07/24 for L-AKA by Dr. Magda. Post op placed on heparin  drip while H/H was being monitored and stable in 9-10 range so transitioned to Xarelto    He has had issues with acute on chronic renal failure and was treated with IVF for hydration and jardiance  d/c. BS have been labile and insulin  glargline titrated up for tighter BS coverage. Wound VAC removed yesterday and wound reported to be healing well. Pain control improving but noted have adjustment reaction due to limb loss. PT/OT has been working with patient who requires multimodal cues with activity, min assist with SPT as well as cues for AE use and min to mod assist with ADL tasks. He was independent PTA and awaiting start of XRT/Chemo. CIR recommended due to functional decline.  Patient transferred to CIR on 03/15/2024 .   Patient currently requires min with mobility secondary to muscle weakness and muscle joint tightness, decreased cardiorespiratoy endurance, and decreased sitting balance, decreased standing balance, decreased balance strategies, and difficulty maintaining precautions.  Prior to hospitalization, patient was independent  with mobility and lived with Spouse in a House home.  Home access is 2Stairs to enter, Ramped entrance.  Patient will benefit from skilled PT intervention to maximize safe functional mobility, minimize fall risk, and decrease caregiver burden for planned discharge home with 24 hour assist.  Anticipate patient will benefit from follow  up HH at discharge.  PT - End of Session Activity Tolerance: Tolerates 30+ min activity with multiple rests Endurance Deficit: Yes PT Assessment Rehab Potential (ACUTE/IP ONLY): Good PT Barriers to Discharge: None PT Patient demonstrates impairments in the following area(s): Balance;Edema;Endurance;Motor;Nutrition;Safety;Skin Integrity PT Transfers Functional Problem(s): Bed Mobility;Bed to Chair;Car;Furniture PT Locomotion Functional Problem(s):  Ambulation;Wheelchair Mobility;Stairs PT Plan PT Intensity: Minimum of 1-2 x/day ,45 to 90 minutes PT Frequency: 5 out of 7 days PT Duration Estimated Length of Stay: 7-10 PT Treatment/Interventions: Ambulation/gait training;Discharge planning;Functional mobility training;Psychosocial support;Therapeutic Activities;Visual/perceptual remediation/compensation;Wheelchair propulsion/positioning;Therapeutic Exercise;Skin care/wound management;Neuromuscular re-education;Disease management/prevention;Balance/vestibular training;Cognitive remediation/compensation;Pain management;DME/adaptive equipment instruction;Splinting/orthotics;UE/LE Strength taining/ROM;UE/LE Coordination activities;Stair training;Patient/family education;Functional electrical stimulation;Community reintegration PT Transfers Anticipated Outcome(s): supervision PT Locomotion Anticipated Outcome(s): supervision for [facility] PT Recommendation Recommendations for Other Services: Neuropsych consult Follow Up Recommendations: Home health PT Patient destination: Home Equipment Recommended: To be determined   PT Evaluation Precautions/Restrictions Precautions Precautions: Fall Recall of Precautions/Restrictions: Intact Restrictions Weight Bearing Restrictions Per Provider Order: Yes LLE Weight Bearing Per Provider Order: Non weight bearing Other Position/Activity Restrictions: AKA Pain Interference Pain Interference Pain Effect on Sleep: 1. Rarely or not at all Pain Interference with Therapy Activities: 1. Rarely or not at all Pain Interference with Day-to-Day Activities: 1. Rarely or not at all Home Living/Prior Functioning Home Living Available Help at Discharge: Family;Available 24 hours/day Type of Home: House Home Access: Stairs to enter;Ramped entrance Entrance Stairs-Number of Steps: 2 Entrance Stairs-Rails: Right;Left Home Layout: One level Bathroom Accessibility: Yes  Lives With: Spouse Prior Function Level of  Independence: Independent with gait;Independent with transfers;Independent with homemaking with ambulation  Able to Take Stairs?: Yes Driving: Yes Vocation: Retired Optometrist - History Ability to See in Adequate Light: 0 Adequate Perception Perception: Within Functional Limits Praxis Praxis: WFL  Cognition Overall Cognitive Status: Within Functional Limits for tasks assessed Arousal/Alertness: Awake/alert Orientation Level: Oriented X4 Memory: Appears intact Awareness: Appears intact Problem Solving: Impaired Safety/Judgment: Appears intact Sensation Sensation Light Touch: Impaired by gross assessment Proprioception: Appears Intact Coordination Gross Motor Movements are Fluid and Coordinated: No Fine Motor Movements are Fluid and Coordinated: Yes Finger Nose Finger Test: [facility] Motor  Motor Motor: Within Functional Limits   Trunk/Postural Assessment  Cervical Assessment Cervical Assessment: Within Functional Limits Thoracic Assessment Thoracic Assessment: Within Functional Limits Lumbar Assessment Lumbar Assessment: Within Functional Limits Postural Control Postural Control: Within Functional Limits  Balance Balance Balance Assessed: Yes Dynamic Sitting Balance Dynamic Sitting - Balance Support: Feet supported Dynamic Sitting - Level of Assistance: 5: Stand by assistance Static Standing Balance Static Standing - Balance Support: Bilateral upper extremity supported Static Standing - Level of Assistance: 5: Stand by assistance Dynamic Standing Balance Dynamic Standing - Balance Support: Right upper extremity supported Dynamic Standing - Level of Assistance: 4: Min assist Dynamic Standing - Balance Activities: Lateral lean/weight shifting Extremity Assessment  RUE Assessment RUE Assessment: Within Functional Limits Active Range of Motion (AROM) Comments: WFL LUE Assessment LUE Assessment: Within Functional Limits Active Range of Motion (AROM)  Comments: WFL General Strength Comments: was going to recieve rotator cuff surgery before LE issues, however states is not currently bothering him RLE Assessment RLE Assessment: Within Functional Limits General Strength Comments: grossly 5/5 LLE Assessment LLE Assessment: Exceptions to [facility] General Strength Comments: hip flex 4+/5  Care Tool Care Tool Bed Mobility Roll left and right activity   Roll left and right assist level: Contact Guard/Touching assist    Sit to lying activity   Sit to lying assist level: Contact Guard/Touching  assist    Lying to sitting on side of bed activity   Lying to sitting on side of bed assist level: the ability to move from lying on the back to sitting on the side of the bed with no back support.: Contact Guard/Touching assist     Care Tool Transfers Sit to stand transfer   Sit to stand assist level: Minimal Assistance - Patient > 75%    Chair/bed transfer   Chair/bed transfer assist level: Minimal Assistance - Patient > 75%    Car transfer   Car transfer assist level: Minimal Assistance - Patient > 75%      Care Tool Locomotion Ambulation   Assist level: Minimal Assistance - Patient > 75% Assistive device: Walker-rolling Max distance: 5 ft  Walk 10 feet activity Walk 10 feet activity did not occur: Safety/medical concerns (L AKA/weakness/fatigue)       Walk 50 feet with 2 turns activity Walk 50 feet with 2 turns activity did not occur: Safety/medical concerns (L AKA/weakness/fatigue)      Walk 150 feet activity Walk 150 feet activity did not occur: Safety/medical concerns (L AKA/weakness/fatigue)      Walk 10 feet on uneven surfaces activity Walk 10 feet on uneven surfaces activity did not occur: Safety/medical concerns (L AKA/weakness/fatigue)      Stairs Stair activity did not occur: Safety/medical concerns (L AKA/weakness/fatigue)        Walk up/down 1 step activity Walk up/down 1 step or curb (drop down) activity did not occur:  Safety/medical concerns (L AKA/weakness/fatigue)      Walk up/down 4 steps activity Walk up/down 4 steps activity did not occur: Safety/medical concerns (L AKA/weakness/fatigue)      Walk up/down 12 steps activity Walk up/down 12 steps activity did not occur: Safety/medical concerns (L AKA/weakness/fatigue)      Pick up small objects from floor Pick up small object from the floor (from standing position) activity did not occur: Safety/medical concerns (L AKA/weakness/fatigue)      Wheelchair Is the patient using a wheelchair?: Yes Type of Wheelchair: Manual   Wheelchair assist level: Moderate Assistance - Patient 50 - 74% Max wheelchair distance: 110 ft  Wheel 50 feet with 2 turns activity   Assist Level: Moderate Assistance - Patient 50 - 74%  Wheel 150 feet activity Wheelchair 150 feet activity did not occur: Safety/medical concerns (weakness/fatigue)      Refer to Care Plan for Long Term Goals  SHORT TERM GOAL WEEK 1 PT Short Term Goal 1 (Week 1): STG=LTG due to ELOS  Recommendations for other services: Neuropsych  Evaluation completed (see details above) with patient education regarding purpose of PT evaluation, PT POC and goals, therapy schedule, weekly team meetings, and other CIR information including safety plan and fall risk safety. Pt seated in [facility] with wife present upon arrival. Pt denies pain and agreeable to PT eval. Pt states L residual limb dressing was removed yesterday. PT switched out WC for improved fit/comfort and to prevent L residual limb from hanging off. Pt appreciative of this. Pt performed lateral scoot transfers WC <> EOB with min A. Pt performed squat pivot transfer WC <> mat when switching out WC. Pt able to propel WC using B UE max distance ~110 ft before stopping due to UE fatigue - pt with history of L RTC tear. Pt stood to RW and able to take 5 hop-steps before requiring seated rest break due to fatigue. Pt supine in bed and left in care of NT at end  of  session. Pt performed the below functional mobility tasks with the specified levels of skilled cuing and assistance.  Skilled Therapeutic Intervention Mobility Bed Mobility Bed Mobility: Rolling Right;Rolling Left;Sit to Supine;Supine to Sit Rolling Right: Contact Guard/Touching assist Rolling Left: Contact Guard/Touching assist Supine to Sit: Contact Guard/Touching assist (++ time) Sit to Supine: Contact Guard/Touching assist Transfers Transfers: Squat Pivot Transfers;Lateral/Scoot Transfers;Sit to Stand;Stand to Sit Sit to Stand: Minimal Assistance - Patient > 75% (RW) Stand to Sit: Minimal Assistance - Patient > 75% Squat Pivot Transfers: Minimal Assistance - Patient > 75% Lateral/Scoot Transfers: Minimal Assistance - Patient > 75% Locomotion  Gait Ambulation: Yes Gait Assistance: Minimal Assistance - Patient > 75% Gait Distance (Feet): 5 Feet Assistive device: Rolling walker Gait Assistance Details: Verbal cues for gait pattern;Verbal cues for safe use of DME/AE;Verbal cues for sequencing;Verbal cues for technique;Verbal cues for precautions/safety Gait Gait: Yes Gait Pattern: Impaired Gait Pattern: Decreased step length - right;Poor foot clearance - right (hopping in R LE due to L AKA) Gait velocity: decreased Stairs / Additional Locomotion Stairs: No Wheelchair Mobility Wheelchair Mobility: Yes Wheelchair Assistance: Moderate Assistance - Patient 50 - 74% Wheelchair Propulsion: Both upper extremities Wheelchair Parts Management: Needs assistance Distance: 110 ft   Discharge Criteria: Patient will be discharged from PT if patient refuses treatment 3 consecutive times without medical reason, if treatment goals not met, if there is a change in medical status, if patient makes no progress towards goals or if patient is discharged from hospital.  The above assessment, treatment plan, treatment alternatives and goals were discussed and mutually agreed upon: by patient and by  family  Comer CHRISTELLA Levora Comer Levora, PT, DPT 03/16/2024, 4:55 PM

## 2024-03-16 NOTE — Plan of Care (Signed)
°  Problem: RH Balance Goal: LTG: Patient will maintain dynamic sitting balance (OT) Description: LTG:  Patient will maintain dynamic sitting balance with assistance during activities of daily living (OT) Flowsheets (Taken 03/16/2024 1326) LTG: Pt will maintain dynamic sitting balance during ADLs with: Independent with assistive device Goal: LTG Patient will maintain dynamic standing with ADLs (OT) Description: LTG:  Patient will maintain dynamic standing balance with assist during activities of daily living (OT)  Flowsheets (Taken 03/16/2024 1326) LTG: Pt will maintain dynamic standing balance during ADLs with: Supervision/Verbal cueing   Problem: Sit to Stand Goal: LTG:  Patient will perform sit to stand in prep for activites of daily living with assistance level (OT) Description: LTG:  Patient will perform sit to stand in prep for activites of daily living with assistance level (OT) Flowsheets (Taken 03/16/2024 1326) LTG: PT will perform sit to stand in prep for activites of daily living with assistance level: Supervision/Verbal cueing   Problem: RH Bathing Goal: LTG Patient will bathe all body parts with assist levels (OT) Description: LTG: Patient will bathe all body parts with assist levels (OT) Flowsheets (Taken 03/16/2024 1326) LTG: Pt will perform bathing with assistance level/cueing: Supervision/Verbal cueing   Problem: RH Dressing Goal: LTG Patient will perform upper body dressing (OT) Description: LTG Patient will perform upper body dressing with assist, with/without cues (OT). Flowsheets (Taken 03/16/2024 1326) LTG: Pt will perform upper body dressing with assistance level of: Independent with assistive device Goal: LTG Patient will perform lower body dressing w/assist (OT) Description: LTG: Patient will perform lower body dressing with assist, with/without cues in positioning using equipment (OT) Flowsheets (Taken 03/16/2024 1326) LTG: Pt will perform lower body dressing  with assistance level of: Supervision/Verbal cueing   Problem: RH Toileting Goal: LTG Patient will perform toileting task (3/3 steps) with assistance level (OT) Description: LTG: Patient will perform toileting task (3/3 steps) with assistance level (OT)  Flowsheets (Taken 03/16/2024 1326) LTG: Pt will perform toileting task (3/3 steps) with assistance level: Supervision/Verbal cueing   Problem: RH Toilet Transfers Goal: LTG Patient will perform toilet transfers w/assist (OT) Description: LTG: Patient will perform toilet transfers with assist, with/without cues using equipment (OT) Flowsheets (Taken 03/16/2024 1326) LTG: Pt will perform toilet transfers with assistance level of: Supervision/Verbal cueing

## 2024-03-16 NOTE — Progress Notes (Signed)
 Orthopedic Tech Progress Note Patient Details:  Douglas Hunter Apr 16, 1955 [id number]  Routine order for an AK shrinker has been called into [facility].  Patient ID: Douglas Hunter, male   DOB: [birth year], 68 y.o.   MRN: [id number]  [name] 03/16/2024, 6:26 PM

## 2024-03-16 NOTE — Plan of Care (Signed)
  Problem: Consults Goal: RH LIMB LOSS PATIENT EDUCATION Description: Description: See Patient Education module for eduction specifics. Outcome: Progressing   Problem: RH SAFETY Goal: RH STG ADHERE TO SAFETY PRECAUTIONS W/ASSISTANCE/DEVICE Description: STG Adhere to Safety Precautions With  cues Assistance/Device. Outcome: Progressing   Problem: RH PAIN MANAGEMENT Goal: RH STG PAIN MANAGED AT OR BELOW PT'S PAIN GOAL Description: Pain < 4 with prns Outcome: Progressing

## 2024-03-16 NOTE — Progress Notes (Signed)
 Occupational Therapy Session Note  Patient Details  Name: Douglas Hunter MRN: [id number] Date of Birth: 04-30-1955  Today's Date: 03/16/2024 OT Individual Time: 1515-1610 OT Individual Time Calculation (min): 55 min    Short Term Goals: Week 1:  OT Short Term Goal 1 (Week 1): patient will complete LB dressing with mod A OT Short Term Goal 2 (Week 1): patient will complete toileting with LRAD with mod A OT Short Term Goal 3 (Week 1): patient will complete transfers with overall CGA  Skilled Therapeutic Interventions/Progress Updates:  Patient agreeable to participate in OT session. Reports 0/10 pain level.   Patient participated in skilled OT session focusing on wc mobility, transfer training, UE strengthening, education. Patient received in bed. Patient able to complete lateral transfer to wc with CGA to min A with verbal cueing. Patient educated on placement of chair, placement of foot, and safety during lateral transfers. Patient completed wc mobility 160 ft before fatigue utilizing b/l UE. Patient then total A for wc transport to ortho gym. Patient completed NuSTEP 4 minutes forward and backwards to increase UE strength level 5.  Patient and spouse educated on amputation process, safety, and needs with all questions being answered. Patient then transported back to room, completed functional transfer with CGA with wc set up to recliner to increase patient transfer efficiency to different surfaces. MD came into room with rounds, OT reported patients performance to MD. Patient left in room alarm on all needs in reach Therapy Documentation Precautions:  Precautions Precautions: Fall Recall of Precautions/Restrictions: Intact Restrictions Weight Bearing Restrictions Per Provider Order: Yes LLE Weight Bearing Per Provider Order: Non weight bearing Other Position/Activity Restrictions: AKA   Therapy/Group: Individual Therapy  D'mariea L Jone Panebianco 03/16/2024, 4:18 PM

## 2024-03-16 NOTE — Progress Notes (Signed)
 PROGRESS NOTE   Subjective/Complaints: Patient sitting in bedside chair, just finished working with OT.  Reports occasional phantom sensation/pain but not significant enough that he would like any medication changes.  Bowel movement today.  ROS: Patient denies fever, new vision changes, dizziness, nausea, vomiting, diarrhea,  shortness of breath or chest pain, headache, or mood change. + insomnia   Objective:   No results found. Recent Labs    03/16/24 0547  WBC 13.4*  HGB 11.5*  HCT 34.4*  PLT 510*   Recent Labs    03/16/24 0547  NA 135  K 3.8  CL 104  CO2 22  GLUCOSE 151*  BUN 26*  CREATININE 1.03  CALCIUM  8.6*    Intake/Output Summary (Last 24 hours) at 03/16/2024 1230 Last data filed at 03/16/2024 0740 Gross per 24 hour  Intake 360 ml  Output --  Net 360 ml        Physical Exam: Vital Signs Blood pressure (!) 146/66, pulse 60, temperature 98.1 F (36.7 C), temperature source Oral, resp. rate 18, height 5' 11 (1.803 m), weight 100.6 kg, SpO2 97%.  Constitutional: No apparent distress.  HENT: Atraumatic, normocephalic. +HOH Cardiovascular: RRR, no murmurs/rub/gallops.   Respiratory: CTAB. Nonlabored breathing Abdomen: + bowel sounds, normoactive. No distention or tenderness.  Skin: C/D/I. No apparent lesions. AKA staple line well approximated, intact,open to air   MSK:      + L AKA;  1+ edema but no drainage. Well formed      + L shoulder ROM deficit - prior RTC tear       Neurologic exam: Alert and oriented x 3, follows commands, no speech or language deficits noted, cranial nerves II through XII grossly intact Moving all extremities to gravity and resistance   Prior exam Cognition: AAO to person, place, time and event.   Language: Fluent, No substitutions or neoglisms. No dysarthria. Names 3/3 objects correctly.   Memory: Recalls 3/3 objects at 5 minutes. No apparent deficits   Insight:  Good  insight into current condition.  Mood: Pleasant affect, appropriate mood.  Sensation: To light touch intact in BL UEs and LEs  Reflexes: 2+ in BL UE and LEs. Negative Hoffman's and babinski signs bilaterally.  CN: 2-12 grossly intact.  Coordination: No apparent tremors. No ataxia Spasticity: MAS 0 in all extremities.       Strength:                RUE: 5/5 SA, 5/5 EF, 5/5 EE, 5/5 WE, 5/5 FF, 5/5 FA                LUE:  5/5 SA, 5/5 EF, 5/5 EE, 5/5 WE, 5/5 FF, 5/5 FA                RLE: 5/5 HF, 5/5 KE, 5/5  DF, 5/5  EHL, 5/5  PF                 LLE:  5/5 HF     Assessment/Plan: 1. Functional deficits which require 3+ hours per day of interdisciplinary therapy in a comprehensive inpatient rehab setting. Physiatrist is providing close team supervision and 24 hour management of  active medical problems listed below. Physiatrist and rehab team continue to assess barriers to discharge/monitor patient progress toward functional and medical goals  Care Tool:  Bathing              Bathing assist       Upper Body Dressing/Undressing Upper body dressing        Upper body assist      Lower Body Dressing/Undressing Lower body dressing            Lower body assist       Toileting Toileting    Toileting assist       Transfers Chair/bed transfer  Transfers assist           Locomotion Ambulation   Ambulation assist              Walk 10 feet activity   Assist           Walk 50 feet activity   Assist           Walk 150 feet activity   Assist           Walk 10 feet on uneven surface  activity   Assist           Wheelchair     Assist               Wheelchair 50 feet with 2 turns activity    Assist            Wheelchair 150 feet activity     Assist          Blood pressure (!) 146/66, pulse 60, temperature 98.1 F (36.7 C), temperature source Oral, resp. rate 18, height 5' 11 (1.803 m),  weight 100.6 kg, SpO2 97%.  Medical Problem List and Plan: 1. 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  Antithrombotics: -DVT/anticoagulation:  Pharmaceutical: Xarelto              -antiplatelet therapy:  3. Pain Management: Denies any pain but starting to have phantom limb sensation --tylenol  prn mild pain and oxycodone  prn severe pain.  -12/13 patient reports some mild phantom pain but overall under control continue to monitor for now 4. Mood/Behavior/Sleep: LCSW to follow for evaluation and support. Team to provide ego support as has been having a difficult time coming to grips with limb loss             --Ativan  scheduled for insomnia (has helped when given). Very anxious about lack of sleep             -antipsychotic agents: N/A 5. Neuropsych/cognition: This patient is capable of making decisions on his own behalf. 6. Skin/Wound Care: Monitor wound for healing. Juven and vitamin C  added 7. Fluids/Electrolytes/Nutrition: Monitor I/O. Daily weights to monitor for signs of overload.  8.  L-AKA for limb ischemia:Monitor for healing. Staple removal appt Jan 6th. 9.  T2DM: w/retinopathy OD: HGb A1c- 8.0. PTA on Jardiance , Lantus  25U bid and humalog  16U TID             --currently on Insulin  glargine 45 units BID (adjusted to home regimen of  b'fast and supper) with novolog  10 units TID             --change SSI to moderate as anticipate BS to improve with intensiver rehab program.   -12/13 CBGs appear improved today 114 at 6 AM, 151 at 931, 123 at 1210.  Continue to monitor trend (CBGs and flowsheet) 10.  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 On Decadron  2 mg daily. Monitor for neurologic symptoms.             --per Radiation Onc noted 12/01-->plans were to keep patient on decadron  2 mg BID  and possibly increase over radiation Tx depending on symptoms.   - 2/13 patient and spouse would like to know when chemo can be resumed, consider checking with oncologist next week 11. H/o A fib/flutter/NSVT: Monitor HR TID and for symptoms with activity             --on Tikosyn , Toprol  and Xarelto  12.  CKD 3a: Baseline SCr @1 .3- 1.5 range for past couple of years             --acute on chronic renal failure improved with IVF and d/c of Jardiance .   - 12/13 BUN and creatinine improved today to 26/1.3 13.  Leucocytosis: WBC 14.9 at admission and has been has been in 13-14 range             --likely due to decadron  for cerebral edema.  --Continue to monitor for fevers and other signs of infection. -12/13 WBC still elevated at 13.4 continue to monitor 14. OSA: Compliant with CPAP use at home but refusing in hospital.  --wife to bring his machine at home which will help with sleep.  15. Acute blood loss anemia: Stable overall 9-10 range.             --monitor for signs of bleeding with Xarelto  on board 16. Situational depression w/anxiety: Lexapro  added for mood stabilization.  --Both wife and patient emotional regarding health issues.  17. Class 1 obesity: BMI: 33.8 @ admission-->has been losing weight for past 2-3 months.         LOS: 1 days A FACE TO FACE EVALUATION WAS PERFORMED  Murray Collier 03/16/2024, 12:30 PM

## 2024-03-16 NOTE — Progress Notes (Signed)
 Called ortho tech to obtain brace per MD order.  Per tech brace would be delivered from [facility] on Monday 12/15.

## 2024-03-17 DIAGNOSIS — I10 Essential (primary) hypertension: Secondary | ICD-10-CM

## 2024-03-17 DIAGNOSIS — R739 Hyperglycemia, unspecified: Secondary | ICD-10-CM | POA: Diagnosis not present

## 2024-03-17 MED ADMIN — Nutritional Supplement Liquid: 11 [oz_av] | ORAL | NDC 7007466898

## 2024-03-17 NOTE — Progress Notes (Signed)
 PROGRESS NOTE   Subjective/Complaints:  Patient doing well, slept well, pain doing ok, LBM this morning, urinating fine. No other complaints or concerns.   ROS: as per HPI. Denies CP, SOB, abd pain, N/V/D/C, or any other complaints at this time.   + insomnia   Objective:   No results found. Recent Labs    03/16/24 0547  WBC 13.4*  HGB 11.5*  HCT 34.4*  PLT 510*   Recent Labs    03/16/24 0547  NA 135  K 3.8  CL 104  CO2 22  GLUCOSE 151*  BUN 26*  CREATININE 1.03  CALCIUM  8.6*    Intake/Output Summary (Last 24 hours) at 03/17/2024 1036 Last data filed at 03/17/2024 0736 Gross per 24 hour  Intake 720 ml  Output 1000 ml  Net -280 ml        Physical Exam: Vital Signs Blood pressure (!) 145/84, pulse 64, temperature 97.9 F (36.6 C), temperature source Oral, resp. rate 18, height 5' 11 (1.803 m), weight 102.2 kg, SpO2 97%.  Constitutional: No apparent distress. Resting comfortably in bed.  HENT: Atraumatic, normocephalic. +HOH Cardiovascular: RRR, no murmurs/rub/gallops.   Respiratory: CTAB. Nonlabored breathing Abdomen: + bowel sounds, normoactive. No distention though protuberant, nonTTP Skin: C/D/I. No apparent lesions. AKA staple line well approximated, intact,open to air   MSK:      + L AKA;  1+ edema but no drainage. Well formed      + L shoulder ROM deficit - prior RTC tear-- not reassessed       Neurologic exam: Alert and oriented x 3, follows commands, no speech or language deficits   Prior exam Cognition: AAO to person, place, time and event.   Language: Fluent, No substitutions or neoglisms. No dysarthria. Names 3/3 objects correctly.   Memory: Recalls 3/3 objects at 5 minutes. No apparent deficits   Insight: Good  insight into current condition.  Mood: Pleasant affect, appropriate mood.  Sensation: To light touch intact in BL UEs and LEs  Reflexes: 2+ in BL UE and LEs. Negative  Hoffman's and babinski signs bilaterally.  CN: 2-12 grossly intact.  Coordination: No apparent tremors. No ataxia Spasticity: MAS 0 in all extremities.       Strength:                RUE: 5/5 SA, 5/5 EF, 5/5 EE, 5/5 WE, 5/5 FF, 5/5 FA                LUE:  5/5 SA, 5/5 EF, 5/5 EE, 5/5 WE, 5/5 FF, 5/5 FA                RLE: 5/5 HF, 5/5 KE, 5/5  DF, 5/5  EHL, 5/5  PF                 LLE:  5/5 HF     Assessment/Plan: 1. Functional deficits which require 3+ hours per day of interdisciplinary therapy in a comprehensive inpatient rehab setting. Physiatrist is providing close team supervision and 24 hour management of active medical problems listed below. Physiatrist and rehab team continue to assess barriers to discharge/monitor patient progress toward functional and medical  goals  Care Tool:  Bathing    Body parts bathed by patient: Right arm, Left upper leg, Left arm, Chest, Abdomen, Face, Front perineal area, Buttocks, Right lower leg     Body parts n/a: Left lower leg   Bathing assist Assist Level: Moderate Assistance - Patient 50 - 74%     Upper Body Dressing/Undressing Upper body dressing        Upper body assist Assist Level: Supervision/Verbal cueing    Lower Body Dressing/Undressing Lower body dressing            Lower body assist Assist for lower body dressing: Maximal Assistance - Patient 25 - 49%     Toileting Toileting    Toileting assist Assist for toileting: Total Assistance - Patient < 25%     Transfers Chair/bed transfer  Transfers assist     Chair/bed transfer assist level: Minimal Assistance - Patient > 75%     Locomotion Ambulation   Ambulation assist      Assist level: Minimal Assistance - Patient > 75% Assistive device: Walker-rolling Max distance: 5 ft   Walk 10 feet activity   Assist  Walk 10 feet activity did not occur: Safety/medical concerns (L AKA/weakness/fatigue)        Walk 50 feet activity   Assist Walk 50 feet  with 2 turns activity did not occur: Safety/medical concerns (L AKA/weakness/fatigue)         Walk 150 feet activity   Assist Walk 150 feet activity did not occur: Safety/medical concerns (L AKA/weakness/fatigue)         Walk 10 feet on uneven surface  activity   Assist Walk 10 feet on uneven surfaces activity did not occur: Safety/medical concerns (L AKA/weakness/fatigue)         Wheelchair     Assist Is the patient using a wheelchair?: Yes Type of Wheelchair: Manual    Wheelchair assist level: Moderate Assistance - Patient 50 - 74% Max wheelchair distance: 110 ft    Wheelchair 50 feet with 2 turns activity    Assist        Assist Level: Moderate Assistance - Patient 50 - 74%   Wheelchair 150 feet activity     Assist  Wheelchair 150 feet activity did not occur: Safety/medical concerns (weakness/fatigue)       Blood pressure (!) 145/84, pulse 64, temperature 97.9 F (36.6 C), temperature source Oral, resp. rate 18, height 5' 11 (1.803 m), weight 102.2 kg, SpO2 97%.  Medical Problem List and Plan: 1. 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  Antithrombotics: -DVT/anticoagulation:  Pharmaceutical: Xarelto  20mg  daily             -antiplatelet therapy: n/a 3. Pain Management: Denies any pain but starting to have phantom limb sensation --tylenol  prn mild pain and oxycodone  prn severe pain.  -12/13 patient reports some mild phantom pain but overall under control continue to monitor for now  4. Mood/Behavior/Sleep: LCSW to follow for evaluation and support. Team to provide ego support as has been having a difficult time coming to grips with limb loss -Ativan  0.5mg  nightly scheduled for insomnia (has helped when given).  Very anxious about lack of sleep              -antipsychotic agents: N/A  -Melatonin 5mg  nightly  5. Neuropsych/cognition: This patient is capable of making decisions on his own behalf.  6. Skin/Wound Care: Monitor wound for healing. Juven and vitamin C  added  7. Fluids/Electrolytes/Nutrition: Monitor I/O. Daily weights to monitor for signs of overload. Continue vitamins/supplements. Protonix  40mg  daily.  -12/13 Tokosyn- 40 mEq potassium PO x 1 for K 3.8 ordered after discussion with pharmacy  8.  L-AKA for limb ischemia: Monitor for healing. Staple removal appt Jan 6th.  9.  T2DM: w/retinopathy OD: HGb A1c- 8.0. PTA on Jardiance , Lantus  25U bid and humalog  16U TID --currently on Insulin  glargine 45 units BID (adjusted to home regimen of b'fast and supper) with novolog  10 units TID --change SSI to moderate as anticipate BS to improve with intensiver rehab program.  -12/13 CBGs appear improved today 114 at 6 AM, 151 at 931, 123 at 1210.  Continue to monitor trend (CBGs and flowsheet) -03/17/24 CBGs good except 289 yesterday afternoon; continue monitoring trend  10.  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 On Decadron  2 mg daily. Monitor for neurologic symptoms. --per Radiation Onc noted 12/01-->plans were to keep patient on decadron  2 mg BID and possibly increase over radiation Tx depending on symptoms.  - 12/13 patient and spouse would like to know when chemo can be resumed, consider checking with oncologist next week  11. H/o A fib/flutter/NSVT: Monitor HR TID and for symptoms with activity             --on Tikosyn  500mcg BID, Toprol  25mg  daily and Xarelto   12.  CKD 3a: Baseline SCr @1 .3- 1.5 range for past couple of years             --acute on chronic renal failure improved with IVF and d/c of Jardiance .   - 12/13 BUN and creatinine improved today to 26/1.3  13.  Leucocytosis: WBC 14.9 at admission and has been has been in 13-14 range             --likely due to decadron  for cerebral edema.  --Continue to  monitor for fevers and other signs of infection. -12/13 WBC still elevated at 13.4 continue to monitor  14. OSA: Compliant with CPAP use at home but refusing in hospital.  --wife to bring his machine at home which will help with sleep.   15. Acute blood loss anemia: Stable overall 9-10 range.             --monitor for signs of bleeding with Xarelto  on board  -03/17/24 Hgb 11.5 yesterday, monitor routinely  16. Situational depression w/anxiety: Lexapro  5mg  daily added for mood stabilization.  --Both wife and patient emotional regarding health issues.   17. Class 1 obesity: BMI: 33.8 @ admission-->has been losing weight for past 2-3 months.    18. HLD: continue Lipitor  80mg  nightly and Zetia  10mg  daily  19. HTN: continue Benazepril  20mg  daily, Toprol  as above  -03/17/24 BPs fairly well controlled, monitor Vitals:   03/15/24 1942 03/16/24 0559 03/16/24 0931 03/16/24 1733  BP: 136/66 (!) 146/66 (!) 146/66 121/66   03/16/24 2200 03/17/24 0526  BP: 139/73 (!) 145/84       LOS: 2 days A FACE TO FACE EVALUATION WAS PERFORMED  [address] 03/17/2024, 10:36 AM

## 2024-03-17 NOTE — Evaluation (Signed)
 Speech Language Pathology Assessment and Plan  Patient Details  Name: Douglas Hunter MRN: [id number] Date of Birth: [birth year]  SLP Diagnosis: Aphasia;Cognitive Impairments  Rehab Potential: Good ELOS: 10 days    Today's Date: 03/17/2024 SLP Individual Time: 1100-1156 SLP Individual Time Calculation (min): 56 min   Hospital Problem: Principal Problem:   Critical limb ischemia of left lower extremity (HCC)  Past Medical History:  Past Medical History:  Diagnosis Date   AKI (acute kidney injury) 03/13/2020   Atrial flutter (HCC)    Coronary artery disease 08/2011   a. NSTEMI 08/2011, LHC w/ severe disease of LCx with good collaterals, lesion was high-risk for intervention due to a steep angulation of the LCx coming from the left main coronary artery as well as heavy calcifications, discused at cath conference w/ consensus advising med Rx; b. 03/2020 Cath: LM nl, LAD 83m, LCX 100p w/ R->L and L->L collats. RCA dominant, nl. EF 55%. PA 33/6/19, PCWP 8.   GERD (gastroesophageal reflux disease)    H/O brain tumor 01/2024   History of echocardiogram    a. 01/2020 Echo: EF 60-65%, no rwma. Sev elev PASP. Mod dil LA.   Hyperlipidemia    Hypertension    Myocardial infarction [facility])    Paroxysmal atrial fibrillation (HCC)    a. s/p ablation 01/2013; b CHADS2VASc => 3 (HTN, DM, vascular disease); c. on Coumadin ; d. 12/2019 Amio d/c'd 2/2 hyperthyroidism.   Poorly controlled diabetes mellitus (HCC)    RBBB    Sleep apnea    uses nightly   Past Surgical History:  Past Surgical History:  Procedure Laterality Date   ABLATION  01/29/2013   PVI and CTI by Dr Kelsie for atrial flutter and paroxysmal atrial fibrillation   AMPUTATION Left 03/07/2024   Procedure: AMPUTATION, ABOVE KNEE;  Surgeon: Magda Debby SAILOR, MD;  Location: [facility] OR;  Service: Vascular;  Laterality: Left;   ANTERIOR CERVICAL DECOMP/DISCECTOMY FUSION N/A 04/18/2018   Procedure: ANTERIOR CERVICAL DECOMPRESSION/DISCECTOMY FUSION  2 LEVELS C5-7;  Surgeon: Clois Fret, MD;  Location: [facility] ORS;  Service: Neurosurgery;  Laterality: N/A;   APPLICATION OF CRANIAL NAVIGATION Left 01/29/2024   Procedure: COMPUTER-ASSISTED NAVIGATION, FOR CRANIAL PROCEDURE;  Surgeon: Rosslyn Dino HERO, MD;  Location: [facility] OR;  Service: Neurosurgery;  Laterality: Left;   APPLICATION OF WOUND VAC Left 03/07/2024   Procedure: APPLICATION, WOUND VAC;  Surgeon: Magda Debby SAILOR, MD;  Location: [facility] OR;  Service: Vascular;  Laterality: Left;   ATRIAL FIBRILLATION ABLATION N/A 01/29/2013   Procedure: ATRIAL FIBRILLATION ABLATION;  Surgeon: Lynwood JONETTA Kelsie, MD;  Location: [facility] CATH LAB;  Service: Cardiovascular;  Laterality: N/A;   ATRIAL FIBRILLATION ABLATION N/A 11/15/2021   Procedure: ATRIAL FIBRILLATION ABLATION;  Surgeon: Cindie Ole DASEN, MD;  Location: [facility] INVASIVE CV LAB;  Service: Cardiovascular;  Laterality: N/A;   CARDIAC CATHETERIZATION  08/03/2011   COLONOSCOPY WITH PROPOFOL  N/A 02/02/2016   Procedure: COLONOSCOPY WITH PROPOFOL ;  Surgeon: Rogelia Copping, MD;  Location: [facility] ENDOSCOPY;  Service: Endoscopy;  Laterality: N/A;   COLONOSCOPY WITH PROPOFOL  N/A 04/20/2023   Procedure: COLONOSCOPY WITH PROPOFOL ;  Surgeon: Copping Rogelia, MD;  Location: [facility] ENDOSCOPY;  Service: Endoscopy;  Laterality: N/A;   CRANIOTOMY Left 01/29/2024   Procedure: CRANIOTOMY TUMOR EXCISION;  Surgeon: Rosslyn Dino HERO, MD;  Location: [facility] OR;  Service: Neurosurgery;  Laterality: Left;  LEFT TEMPORAL MASS RESCETION   ENDARTERECTOMY FEMORAL Left 02/09/2024   Procedure: LEFT FEMORAL ENDARTERECTOMY;  Surgeon: Magda Debby SAILOR, MD;  Location: [facility] OR;  Service:  Vascular;  Laterality: Left;   FASCIECTOMY, LOWER EXTREMITY Left 02/09/2024   Procedure: 4 COMPARTMENT FASCIECTOMY, LEFT LOWER EXTREMITY;  Surgeon: Magda Debby SAILOR, MD;  Location: [facility] OR;  Service: Vascular;  Laterality: Left;   LOWER EXTREMITY ANGIOGRAM Left 02/09/2024   Procedure: INTRAOPERATIVE LEFT LOWER EXTREMITY ANGIOGRAM;   Surgeon: Magda Debby SAILOR, MD;  Location: [facility] OR;  Service: Vascular;  Laterality: Left;   LUMBAR LAMINECTOMY/DECOMPRESSION MICRODISCECTOMY N/A 06/07/2017   Procedure: LUMBAR LAMINECTOMY/DECOMPRESSION MICRODISCECTOMY 2 LEVELS-L3-4,L4-5;  Surgeon: Clois Fret, MD;  Location: [facility] ORS;  Service: Neurosurgery;  Laterality: N/A;   PATCH ANGIOPLASTY  02/09/2024   Procedure: ANGIOPLASTY LEFT FEMORAL ARTERY USING XENOSURE BOVINE PERICARDIUM PATCH;  Surgeon: Magda Debby SAILOR, MD;  Location: [facility] OR;  Service: Vascular;;   POLYPECTOMY  04/20/2023   Procedure: POLYPECTOMY;  Surgeon: Jinny Carmine, MD;  Location: [facility] ENDOSCOPY;  Service: Endoscopy;;   RIGHT/LEFT HEART CATH AND CORONARY ANGIOGRAPHY N/A 03/06/2020   Procedure: RIGHT/LEFT HEART CATH AND CORONARY ANGIOGRAPHY;  Surgeon: Perla Evalene PARAS, MD;  Location: [facility] INVASIVE CV LAB;  Service: Cardiovascular;  Laterality: N/A;   TEE WITHOUT CARDIOVERSION N/A 01/28/2013   Procedure: TRANSESOPHAGEAL ECHOCARDIOGRAM (TEE);  Surgeon: Redell GORMAN Shallow, MD;  Location: [facility] ENDOSCOPY;  Service: Cardiovascular;  Laterality: N/A;   THROMBECTOMY OF BYPASS GRAFT FEMORAL- PERONEAL ARTERY Left 02/09/2024   Procedure: THROMBECTOMY OF LEFT FEMORAL AND POTERIOR TIBIAL  ARTERY;  Surgeon: Magda Debby SAILOR, MD;  Location: [facility] OR;  Service: Vascular;  Laterality: Left;    Assessment / Plan / Recommendation Clinical Impression Pt is a 68 year old male with history of CAD, A flutter, PAF, T2DM, speech difficulties with 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 He was admitted for thrombectomy of left femoral, femorotibial and posterior tibial thrombectomy with 4 compartment fasciotomy by Dr. Magda and discharged on Xarelto . He continued to have LLE pain affecting WB as well as sleep, dehiscence of fasciotomy wound and new interdigit and limb felt to be unsalvageable . He was  admitted on 03/07/24 for L-AKA by Dr. Magda. Post op placed on heparin  drip while H/H was being monitored and stable in 9-10 range so transitioned to Xarelto    He has had issues with acute on chronic renal failure and was treated with IVF for hydration and jardiance  d/c. BS have been labile and insulin  glargline titrated up for tighter BS coverage. Wound VAC removed yesterday and wound reported to be healing well. Pain control improving but noted have adjustment reaction due to limb loss. PT/OT has been working with patient who requires multimodal cues with activity, min assist with SPT as well as cues for AE use and min to mod assist with ADL tasks. He was independent PTA and awaiting start of XRT/Chemo. CIR recommended due to functional decline.   Cognitive-Linguistic: Pt presented w/ mild cognitive-linguistic deficits in the areas of complex auditory comprehension, specific word finding, thought formulation, sustained attention, and complex problem solving. No concerns re speech production. Pt noted to score an 18/30 on the [facility] Mental Status (SLUMS) Exam, however, recall sections significantly impacted success. Given word finding deficits, difficult to truly assess recall during structured sub tests provided. Functionally, he was able to recall therapists from yesterday and recent medical hx WFL. Pt reported cognitive changes began after tumor resection (GBM) in 01/2024. Did not receive ST after. No change in cognitive function this admission, though pt  would still benefit from skilled ST services given recent resection and subsequent cognitive-linguistic deficits. Recommend ST to target cognition and language, maximize pt independence, and facilitate return to prev roles/responsibilities.  Bedside Swallow: No concerns re oropharyngeal swallow function. No overt s/s of airway invasion noted w/ regular textures and thin liquids. Dysphagia intervention not warranted - continue w/ regular  textures and thin liquids.     Skilled Therapeutic Interventions          SLP facilitated a cognitive-linguistic evaluation and brief bedside swallow screen to assess pt's cognitive-communication skills and determine need for additional skilled ST services. See above for more information.    SLP Assessment  Patient will need skilled Speech Lanaguage Pathology Services during CIR admission    Recommendations  SLP Diet Recommendations: Thin;Age appropriate regular solids Liquid Administration via: Straw Medication Administration: Whole meds with liquid Supervision: Patient able to self feed Postural Changes and/or Swallow Maneuvers: Seated upright 90 degrees Oral Care Recommendations: Oral care BID Recommendations for Other Services: Therapeutic Recreation consult Therapeutic Recreation Interventions: Pet therapy Patient destination: Home Follow up Recommendations: Outpatient SLP Equipment Recommended: None recommended by SLP    SLP Frequency 3 to 5 out of 7 days   SLP Duration  SLP Intensity  SLP Treatment/Interventions 10 days  Minumum of 1-2 x/day, 30 to 90 minutes  Cognitive remediation/compensation;Speech/Language facilitation;Cueing hierarchy;Functional tasks;Therapeutic Activities;Therapeutic Exercise;Patient/family education;Internal/external aids    Pain  No pain reported  Prior Functioning  Some assistance from wife since 01/2024  SLP Evaluation Cognition Overall Cognitive Status: Within Functional Limits for tasks assessed Arousal/Alertness: Awake/alert Orientation Level: Oriented X4 Year: 2025 Month: December Day of Week: Correct Attention: Sustained Sustained Attention: Impaired Sustained Attention Impairment: Verbal complex Memory:  (difficult to assess givne language deficits) Awareness: Appears intact Problem Solving: Impaired Problem Solving Impairment: Functional basic;Functional complex Safety/Judgment: Appears intact Comments: anticipate [facility]   Comprehension Auditory Comprehension Overall Auditory Comprehension: Impaired Yes/No Questions: Impaired Basic Biographical Questions: 76-100% accurate Basic Immediate Environment Questions: 75-100% accurate Complex Questions: 75-100% accurate Commands: Within Functional Limits Conversation: Complex Interfering Components: Hearing;Attention EffectiveTechniques: Repetition Visual Recognition/Discrimination Discrimination: Not tested Reading Comprehension Reading Status: Not tested Expression Expression Primary Mode of Expression: Verbal Verbal Expression Overall Verbal Expression: Impaired Initiation: No impairment Level of Generative/Spontaneous Verbalization: Sentence Repetition: No impairment Naming: Impairment Responsive: 76-100% accurate Confrontation: Impaired Divergent: 75-100% accurate Verbal Errors: Semantic paraphasias Pragmatics: No impairment Effective Techniques: Semantic cues Non-Verbal Means of Communication: Not applicable Oral Motor Oral Motor/Sensory Function Overall Oral Motor/Sensory Function: Within functional limits Motor Speech Overall Motor Speech: Appears within functional limits for tasks assessed Intelligibility: Intelligible  Care Tool Care Tool Cognition Ability to hear (with hearing aid or hearing appliances if normally used Ability to hear (with hearing aid or hearing appliances if normally used): 0. Adequate - no difficulty in normal conservation, social interaction, listening to TV   Expression of Ideas and Wants Expression of Ideas and Wants: 3. Some difficulty - exhibits some difficulty with expressing needs and ideas (e.g, some words or finishing thoughts) or speech is not clear   Understanding Verbal and Non-Verbal Content Understanding Verbal and Non-Verbal Content: 3. Usually understands - understands most conversations, but misses some part/intent of message. Requires cues at times to understand  Memory/Recall Ability Memory/Recall  Ability : Current season;Staff names and faces;That he or she is in a hospital/hospital unit   Motor Speech Assessment  Intelligibility: Intelligible  Bedside Swallowing Assessment Thin Liquid Thin Liquid: Within functional limits Presentation: Straw Solid Solid: Within functional limits  Presentation: Self Fed   Short Term Goals: Week 1: SLP Short Term Goal 1 (Week 1): STGs = LTGs d/t ELOS  Refer to Care Plan for Long Term Goals  Recommendations for other services: Therapeutic Recreation  Pet therapy  Discharge Criteria: Patient will be discharged from SLP if patient refuses treatment 3 consecutive times without medical reason, if treatment goals not met, if there is a change in medical status, if patient makes no progress towards goals or if patient is discharged from hospital.  The above assessment, treatment plan, treatment alternatives and goals were discussed and mutually agreed upon: by patient  Douglas Hunter 03/17/2024, 1:04 PM

## 2024-03-17 NOTE — Plan of Care (Signed)
°  Problem: RH Comprehension Communication Goal: LTG Patient will comprehend basic/complex auditory (SLP) Description: LTG: Patient will comprehend basic/complex auditory information with cues (SLP). Flowsheets (Taken 03/17/2024 1755) LTG: Patient will comprehend: Complex auditory information LTG: Patient will comprehend auditory information with cueing (SLP): Supervision   Problem: RH Expression Communication Goal: LTG Patient will verbally express basic/complex needs(SLP) Description: LTG:  Patient will verbally express basic/complex needs, wants or ideas with cues  (SLP) Flowsheets (Taken 03/17/2024 1755) LTG: Patient will verbally express basic/complex needs, wants or ideas (SLP): Supervision Note: complex   Problem: RH Problem Solving Goal: LTG Patient will demonstrate problem solving for (SLP) Description: LTG:  Patient will demonstrate problem solving for basic/complex daily situations with cues  (SLP) Flowsheets (Taken 03/17/2024 1755) LTG: Patient will demonstrate problem solving for (SLP): Complex daily situations LTG Patient will demonstrate problem solving for: Supervision   Problem: RH Attention Goal: LTG Patient will demonstrate this level of attention during functional activites (SLP) Description: LTG:  Patient will will demonstrate this level of attention during functional activites (SLP) Flowsheets (Taken 03/17/2024 1755) Patient will demonstrate during cognitive/linguistic activities the attention type of: Sustained Patient will demonstrate this level of attention during cognitive/linguistic activities in: Controlled LTG: Patient will demonstrate this level of attention during cognitive/linguistic activities with assistance of (SLP): Supervision Number of minutes patient will demonstrate attention during cognitive/linguistic activities: 25

## 2024-03-18 ENCOUNTER — Ambulatory Visit: Admitting: Speech Pathology

## 2024-03-18 DIAGNOSIS — I70222 Atherosclerosis of native arteries of extremities with rest pain, left leg: Secondary | ICD-10-CM | POA: Diagnosis not present

## 2024-03-18 LAB — BASIC METABOLIC PANEL WITH GFR
Anion gap: 6 (ref 5–15)
BUN: 41 mg/dL — ABNORMAL HIGH (ref 8–23)
CO2: 26 mmol/L (ref 22–32)
Calcium: 8.9 mg/dL (ref 8.9–10.3)
Chloride: 101 mmol/L (ref 98–111)
Creatinine, Ser: 1.24 mg/dL (ref 0.61–1.24)
GFR, Estimated: >60 mL/min (ref >60)
Glucose, Bld: 168 mg/dL — ABNORMAL HIGH (ref 70–99)
Potassium: 4.3 mmol/L (ref 3.5–5.1)
Sodium: 133 mmol/L — ABNORMAL LOW (ref 135–145)

## 2024-03-18 LAB — CBC
HCT: 36.7 % — ABNORMAL LOW (ref 39.0–52.0)
Hemoglobin: 12.1 g/dL — ABNORMAL LOW (ref 13.0–17.0)
MCH: 31.5 pg (ref 26.0–34.0)
MCHC: 33 g/dL (ref 30.0–36.0)
MCV: 95.6 fL (ref 80.0–100.0)
Platelets: 490 K/uL — ABNORMAL HIGH (ref 150–400)
RBC: 3.84 MIL/uL — ABNORMAL LOW (ref 4.22–5.81)
RDW: 14.6 % (ref 11.5–15.5)
WBC: 13.5 K/uL — ABNORMAL HIGH (ref 4.0–10.5)
nRBC: 0 % (ref 0.0–0.2)

## 2024-03-18 LAB — GLUCOSE, CAPILLARY: Glucose-Capillary: 201 mg/dL — ABNORMAL HIGH (ref 70–99)

## 2024-03-18 LAB — MAGNESIUM: Magnesium: 1.8 mg/dL (ref 1.7–2.4)

## 2024-03-18 MED ORDER — MAGNESIUM SULFATE 2 GM/50ML IV SOLN
2.0000 g | Freq: Once | INTRAVENOUS | Status: AC
  Administered 2024-03-18: 20:00:00 2 g via INTRAVENOUS
  Filled 2024-03-18: qty 50

## 2024-03-18 MED ADMIN — Nutritional Supplement Liquid: 11 [oz_av] | ORAL | @ 21:00:00 | NDC 7007466898

## 2024-03-18 NOTE — IPOC Note (Signed)
 Overall Plan of Care [facility]) Patient Details Name: Douglas Hunter MRN: [id number] DOB: 10-21-1955  Admitting Diagnosis: Critical limb ischemia of left lower extremity [facility])  Hospital Problems: Principal Problem:   Critical limb ischemia of left lower extremity (HCC)     Functional Problem List: Nursing Endurance, Medication Management, Safety, Pain  PT Balance, Edema, Endurance, Motor, Nutrition, Safety, Skin Integrity  OT Balance, Perception, Safety, Cognition, Behavior, Skin Integrity  SLP Cognition, Linguistic, Motor  TR         Basic ADLs: OT Dressing, Bathing, Toileting     Advanced  ADLs: OT       Transfers: PT Bed Mobility, Bed to Chair, Car, Oncologist: PT Ambulation, Psychologist, [employer], Stairs     Additional Impairments: OT None  SLP Communication, Social Cognition expression, comprehension Problem Solving, Attention  TR      Anticipated Outcomes Item Anticipated Outcome  Self Feeding mod I  Swallowing      Basic self-care  SUP  Toileting  CGA   Bathroom Transfers SUP  Bowel/Bladder  n/a  Transfers  supervision  Locomotion  supervision for [facility]  Communication  supervision  Cognition  supervision  Pain  Pain < 4 with prns  Safety/Judgment  manage safety w cues   Therapy Plan: PT Intensity: Minimum of 1-2 x/day ,45 to 90 minutes PT Frequency: 5 out of 7 days PT Duration Estimated Length of Stay: 7-10 days OT Intensity: Minimum of 1-2 x/day, 45 to 90 minutes OT Frequency: 5 out of 7 days OT Duration/Estimated Length of Stay: 10-14 days SLP Intensity: Minumum of 1-2 x/day, 30 to 90 minutes SLP Frequency: 3 to 5 out of 7 days SLP Duration/Estimated Length of Stay: 10 days   Team Interventions: Nursing Interventions Patient/Family Education, Medication Management, Discharge Planning, Disease Management/Prevention, Skin Care/Wound Management, Pain Management  PT interventions Ambulation/gait training,  Discharge planning, Functional mobility training, Psychosocial support, Therapeutic Activities, Visual/perceptual remediation/compensation, Wheelchair propulsion/positioning, Therapeutic Exercise, Skin care/wound management, Neuromuscular re-education, Disease management/prevention, Metallurgist training, Cognitive remediation/compensation, Pain management, DME/adaptive equipment instruction, Splinting/orthotics, UE/LE Strength taining/ROM, UE/LE Coordination activities, Stair training, Patient/family education, Functional electrical stimulation, Community reintegration  OT Interventions Warden/ranger, Discharge planning, Pain management, Self Care/advanced ADL retraining, Therapeutic Activities, UE/LE Coordination activities, Wheelchair propulsion/positioning, Visual/perceptual remediation/compensation, Therapeutic Exercise, Patient/family education, Skin care/wound managment, Functional mobility training, Disease mangement/prevention, Cognitive remediation/compensation, Community reintegration, Fish [employer] Manager, Neuromuscular re-education, Psychosocial support, UE/LE Strength taining/ROM  SLP Interventions Cognitive remediation/compensation, Speech/Language facilitation, Cueing hierarchy, Functional tasks, Therapeutic Activities, Therapeutic Exercise, Patient/family education, Internal/external aids  TR Interventions    SW/CM Interventions     Barriers to Discharge MD  Medical stability  Nursing Decreased caregiver support, Home environment access/layout, Weight bearing restrictions, Pending chemo/radiation 1 level ramp (pending) bil rail w spouse; prior to October independent without DME, since crani has been requiring up to min assist for mobility/ADLs  PT None    OT Decreased caregiver support decreased caregiver support for current level of assist needed  SLP      SW       Team Discharge Planning: Destination: PT-Home ,OT- Home , SLP-Home Projected  Follow-up: PT-Home health PT, OT-  Home health OT, SLP-Outpatient SLP Projected Equipment Needs: PT-To be determined, OT- 3 in 1 bedside comode, Rolling walker with 5 wheels, Sliding board, SLP-None recommended by SLP Equipment Details: PT- , OT-RW vs slide board for wc Patient/family involved in discharge planning: PT- Patient, Family member/caregiver,  OT-Patient, Family member/caregiver, SLP-Patient  MD ELOS: 10-14 days Medical Rehab Prognosis:  Excellent Assessment: The patient has been admitted for CIR therapies with the diagnosis of left AKA. The team will be addressing functional mobility, strength, stamina, balance, safety, adaptive techniques and equipment, self-care, bowel and bladder mgt, patient and caregiver education. Goals have been set at supervision. Anticipated discharge destination is home.        See Team Conference Notes for weekly updates to the plan of care

## 2024-03-18 NOTE — Progress Notes (Signed)
 Inpatient Rehabilitation Care Coordinator Assessment and Plan Patient Details  Name: Douglas Hunter MRN: [id number] Date of Birth: October 31, 1955  Today's Date: 03/18/2024  Hospital Problems: Principal Problem:   Critical limb ischemia of left lower extremity [facility])  Past Medical History:  Past Medical History:  Diagnosis Date   AKI (acute kidney injury) 03/13/2020   Atrial flutter (HCC)    Coronary artery disease 08/2011   a. NSTEMI 08/2011, LHC w/ severe disease of LCx with good collaterals, lesion was high-risk for intervention due to a steep angulation of the LCx coming from the left main coronary artery as well as heavy calcifications, discused at cath conference w/ consensus advising med Rx; b. 03/2020 Cath: LM nl, LAD 63m, LCX 100p w/ R->L and L->L collats. RCA dominant, nl. EF 55%. PA 33/6/19, PCWP 8.   GERD (gastroesophageal reflux disease)    H/O brain tumor 01/2024   History of echocardiogram    a. 01/2020 Echo: EF 60-65%, no rwma. Sev elev PASP. Mod dil LA.   Hyperlipidemia    Hypertension    Myocardial infarction [facility])    Paroxysmal atrial fibrillation (HCC)    a. s/p ablation 01/2013; b CHADS2VASc => 3 (HTN, DM, vascular disease); c. on Coumadin ; d. 12/2019 Amio d/c'd 2/2 hyperthyroidism.   Poorly controlled diabetes mellitus (HCC)    RBBB    Sleep apnea    uses nightly   Past Surgical History:  Past Surgical History:  Procedure Laterality Date   ABLATION  01/29/2013   PVI and CTI by Dr Kelsie for atrial flutter and paroxysmal atrial fibrillation   AMPUTATION Left 03/07/2024   Procedure: AMPUTATION, ABOVE KNEE;  Surgeon: Magda Debby SAILOR, MD;  Location: [facility] OR;  Service: Vascular;  Laterality: Left;   ANTERIOR CERVICAL DECOMP/DISCECTOMY FUSION N/A 04/18/2018   Procedure: ANTERIOR CERVICAL DECOMPRESSION/DISCECTOMY FUSION 2 LEVELS C5-7;  Surgeon: Clois Fret, MD;  Location: [facility] ORS;  Service: Neurosurgery;  Laterality: N/A;   APPLICATION OF CRANIAL NAVIGATION Left  01/29/2024   Procedure: COMPUTER-ASSISTED NAVIGATION, FOR CRANIAL PROCEDURE;  Surgeon: Rosslyn Dino HERO, MD;  Location: [facility] OR;  Service: Neurosurgery;  Laterality: Left;   APPLICATION OF WOUND VAC Left 03/07/2024   Procedure: APPLICATION, WOUND VAC;  Surgeon: Magda Debby SAILOR, MD;  Location: [facility] OR;  Service: Vascular;  Laterality: Left;   ATRIAL FIBRILLATION ABLATION N/A 01/29/2013   Procedure: ATRIAL FIBRILLATION ABLATION;  Surgeon: Lynwood JONETTA Kelsie, MD;  Location: [facility] CATH LAB;  Service: Cardiovascular;  Laterality: N/A;   ATRIAL FIBRILLATION ABLATION N/A 11/15/2021   Procedure: ATRIAL FIBRILLATION ABLATION;  Surgeon: Cindie Ole DASEN, MD;  Location: [facility] INVASIVE CV LAB;  Service: Cardiovascular;  Laterality: N/A;   CARDIAC CATHETERIZATION  08/03/2011   COLONOSCOPY WITH PROPOFOL  N/A 02/02/2016   Procedure: COLONOSCOPY WITH PROPOFOL ;  Surgeon: Rogelia Copping, MD;  Location: [facility] ENDOSCOPY;  Service: Endoscopy;  Laterality: N/A;   COLONOSCOPY WITH PROPOFOL  N/A 04/20/2023   Procedure: COLONOSCOPY WITH PROPOFOL ;  Surgeon: Copping Rogelia, MD;  Location: [facility] ENDOSCOPY;  Service: Endoscopy;  Laterality: N/A;   CRANIOTOMY Left 01/29/2024   Procedure: CRANIOTOMY TUMOR EXCISION;  Surgeon: Rosslyn Dino HERO, MD;  Location: [facility] OR;  Service: Neurosurgery;  Laterality: Left;  LEFT TEMPORAL MASS RESCETION   ENDARTERECTOMY FEMORAL Left 02/09/2024   Procedure: LEFT FEMORAL ENDARTERECTOMY;  Surgeon: Magda Debby SAILOR, MD;  Location: [facility] OR;  Service: Vascular;  Laterality: Left;   FASCIECTOMY, LOWER EXTREMITY Left 02/09/2024   Procedure: 4 COMPARTMENT FASCIECTOMY, LEFT LOWER EXTREMITY;  Surgeon: Magda Debby SAILOR, MD;  Location: [facility] OR;  Service: Vascular;  Laterality: Left;   LOWER EXTREMITY ANGIOGRAM Left 02/09/2024   Procedure: INTRAOPERATIVE LEFT LOWER EXTREMITY ANGIOGRAM;  Surgeon: Magda Debby SAILOR, MD;  Location: [facility] OR;  Service: Vascular;  Laterality: Left;   LUMBAR LAMINECTOMY/DECOMPRESSION MICRODISCECTOMY N/A 06/07/2017    Procedure: LUMBAR LAMINECTOMY/DECOMPRESSION MICRODISCECTOMY 2 LEVELS-L3-4,L4-5;  Surgeon: Clois Fret, MD;  Location: [facility] ORS;  Service: Neurosurgery;  Laterality: N/A;   PATCH ANGIOPLASTY  02/09/2024   Procedure: ANGIOPLASTY LEFT FEMORAL ARTERY USING XENOSURE BOVINE PERICARDIUM PATCH;  Surgeon: Magda Debby SAILOR, MD;  Location: [facility] OR;  Service: Vascular;;   POLYPECTOMY  04/20/2023   Procedure: POLYPECTOMY;  Surgeon: Jinny Carmine, MD;  Location: [facility] ENDOSCOPY;  Service: Endoscopy;;   RIGHT/LEFT HEART CATH AND CORONARY ANGIOGRAPHY N/A 03/06/2020   Procedure: RIGHT/LEFT HEART CATH AND CORONARY ANGIOGRAPHY;  Surgeon: Perla Evalene PARAS, MD;  Location: [facility] INVASIVE CV LAB;  Service: Cardiovascular;  Laterality: N/A;   TEE WITHOUT CARDIOVERSION N/A 01/28/2013   Procedure: TRANSESOPHAGEAL ECHOCARDIOGRAM (TEE);  Surgeon: Redell GORMAN Shallow, MD;  Location: [facility] ENDOSCOPY;  Service: Cardiovascular;  Laterality: N/A;   THROMBECTOMY OF BYPASS GRAFT FEMORAL- PERONEAL ARTERY Left 02/09/2024   Procedure: THROMBECTOMY OF LEFT FEMORAL AND POTERIOR TIBIAL  ARTERY;  Surgeon: Magda Debby SAILOR, MD;  Location: [facility] OR;  Service: Vascular;  Laterality: Left;   Social History:  reports that he quit smoking about 16 years ago. His smoking use included cigarettes. He started smoking about 51 years ago. He has a 52.5 pack-year smoking history. He has never used smokeless tobacco. He reports that he does not currently use alcohol. He reports that he does not use drugs.  Family / Support Systems Marital Status: Married Patient Roles: Spouse, Parent Spouse/Significant Other: [name] Children: Elspeth and Penne Anticipated Caregiver: [name] Ability/Limitations of Caregiver: Some physical assistance Caregiver Availability: 24/7 Family Dynamics: Family supportive  Social History Preferred language: English Religion: None Education: High school Health Literacy - How often do you need to have someone help you when you read  instructions, pamphlets, or other written material from your doctor or pharmacy?: Never Writes: Yes Employment Status: Retired   Abuse/Neglect Abuse/Neglect Assessment Can Be Completed: Yes Physical Abuse: Denies Verbal Abuse: Denies Sexual Abuse: Denies Exploitation of patient/patient's resources: Denies Self-Neglect: Denies  Patient response to: Social Isolation - How often do you feel lonely or isolated from those around you?: Never  Emotional Status Pt's affect, behavior and adjustment status: Adjsuting very well to therapy Recent Psychosocial Issues: None Psychiatric History: None Substance Abuse History: Former smoker  Patient / Field Seismologist, Expectations & Goals Pt/Family understanding of illness & functional limitations: Patient/Family understansing of illness & funtional limitations Premorbid pt/family roles/activities: Active in the community Anticipated changes in roles/activities/participation: None 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, museum/gallery and goals  [employer] Agencies: None Premorbid Home Care/DME Agencies: Other (Comment) Animal Nutritionist - built in, [place] - single point, Crutches, Firefighter, Agricultural Consultant (2 wheels), BSC/3in1, walker) Transportation available at discharge: Yes Is the patient able to respond to transportation needs?: Yes In the past 12 months, has lack of transportation kept you from medical appointments or from getting medications?: No In the past 12 months, has lack of transportation kept you from meetings, work, or from getting things needed for daily living?: No  Discharge Planning Living Arrangements: Spouse/significant other Support Systems: Spouse/significant other, Children Type of Residence: Private residence Insurance Resources: Media Planner (specify) (ADVERTISING COPYWRITER MEDICARE / UHC MEDICARE) Financial Resources: Employment, Family Support Financial Screen Referred: Yes Living  Expenses:  Own Money Management: Spouse, Patient Does the patient have any problems obtaining your medications?: No Home Management: Patient/spouse manage home Patient/Family Preliminary Plans: Plans to return home with family support Care Coordinator Anticipated Follow Up Needs: HH/OP Expected length of stay: 10-14 days  Clinical Impression CSW met with patient/family to introduce herself and complete initial assessment. Patient presented to [facility] following Critical limb ischemia of left lower extremity. Patient is able to make needs known. Bruschi present during visit. Mr. Knebel lives [place] a farm with his wife [name] - they have 2 sons, Penne and Servellon. Serpe lives close by and Rockleigh lives on the property. Oluwademilade's home is already equipped with DME and a ramp. They would like to have OP therapy at [facility] since that is where the pateint goes for radiation. Declined neuropsych.   There were no further needs or concerns at present. CSW will follow up with family and continue to follow. Will provide patient/family with an update as soon as one becomes available.   Di'Asia  Loreli 03/18/2024, 3:14 PM

## 2024-03-18 NOTE — Progress Notes (Signed)
 Inpatient Rehabilitation Center Individual Statement of Services  Patient Name:  Douglas Hunter  Date:  03/18/2024  Welcome to the Inpatient Rehabilitation Center.  Our goal is to provide you with an individualized program based on your diagnosis and situation, designed to meet your specific needs.  With this comprehensive rehabilitation program, you will be expected to participate in at least 3 hours of rehabilitation therapies Monday-Friday, with modified therapy programming on the weekends.  Your rehabilitation program will include the following services:  Physical Therapy (PT), Occupational Therapy (OT), Speech Therapy (ST), 24 hour per day rehabilitation nursing, Therapeutic Recreaction (TR), Neuropsychology, Care Coordinator, Rehabilitation Medicine, Nutrition Services, and Pharmacy Services  Weekly team conferences will be held on Wednesday to discuss your progress.  Your Inpatient Rehabilitation Care Coordinator will talk with you frequently to get your input and to update you on team discussions.  Team conferences with you and your family in attendance may also be held.  Expected length of stay: 10-14 days  Overall anticipated outcome: Supervision/Verbal cueing   Depending on your progress and recovery, your program may change. Your Inpatient Rehabilitation Care Coordinator will coordinate services and will keep you informed of any changes. Your Inpatient Rehabilitation Care Coordinator's name and contact numbers are listed  below.  The following services may also be recommended but are not provided by the Inpatient Rehabilitation Center:  Driving Evaluations Home Health Rehabiltiation Services Outpatient Rehabilitation Services Vocational Rehabilitation   Arrangements will be made to provide these services after discharge if needed.  Arrangements include referral to agencies that provide these services.  Your insurance has been verified to be: UNITED [employer] MEDICARE / [facility]  MEDICARE  Your primary doctor is:  Feldpausch, Cheryl BRAVO, MD   Pertinent information will be shared with your doctor and your insurance company.  Inpatient Rehabilitation Care Coordinator:  Di'Asia Loreli SIERRAS [phone] or ELIGAH BRINKS  Information discussed with and copy given to patient by: Waverly Loreli, 03/18/2024, 11:20 AM

## 2024-03-18 NOTE — Progress Notes (Signed)
 Physical Therapy Session Note  Patient Details  Name: Douglas Hunter MRN: [id number] Date of Birth: 02-09-1956  Today's Date: 03/18/2024 PT Individual Time: 0900-0944 PT Individual Time Calculation (min): 44 min   Short Term Goals: Week 1:  PT Short Term Goal 1 (Week 1): STG=LTG due to ELOS  Skilled Therapeutic Interventions/Progress Updates:    Pt presents in bed, finishing using urinal with family assist. S to come to EOB with extra time and cues for technique to scoot to EOB. Donned new sock and assisted with donning of R shoe. Sit > stand with RW with min A and verbal cues for hand placement to perform pulling up pants - focused on dynamic standing balance with pt able to assist with RUE but difficult taking LUE off RW and maintaining balance with min A. Performed stand pivot transfer with RW to the R to the w/c with min A but uncontrolled descent to the w/c - educated on importance of lining up and reaching back to w/c prior to sitting. W/c mobilty for UE strengthening and functional mobilty training x 150' x2 with S and cues for efficient technique. Education on w/c parts management and set-up for transfers. Performed squat pivot transfer with min A to the L with focus on technique and eccentric control. Performed blocked practice sit <> stands with focus on technique and hand placement and progressed to dynamic standing balance training for carryover to functional tasks. Pt reports not particularly liking the Rw and doesn't feel like he can take his left hand off despite us  practicing several reps and pt able to do it. Performed core strengthening exercises with 2 kg weighted medicine ball for carryover with balance performing chop diagonals each direction x 10 reps each and then x trunk rotation x 10 reps each direction. Performed stand pivot transfer with RW back to w/c with emphasis on technique and making sure to reach back before sitting for eccentric control - much improved than initial  transfer in room. Returned back to room and left in care of son with safety alarm intact.   Therapy Documentation Precautions:  Precautions Precautions: Fall Recall of Precautions/Restrictions: Intact Restrictions Weight Bearing Restrictions Per Provider Order: Yes LLE Weight Bearing Per Provider Order: Non weight bearing Other Position/Activity Restrictions: AKA  Pain: Denies pain.    Therapy/Group: Individual Therapy  Elnor Pizza Sherrell Pizza WENDI Elnor, PT, DPT, CBIS  03/18/2024, 9:48 AM

## 2024-03-18 NOTE — Progress Notes (Signed)
 PROGRESS NOTE   Subjective/Complaints: No new complaints this morning Wife asks about suture on left side of his head, asked Pam to please consult surgery regarding whether we can remove this  ROS: denies pain   Objective:   No results found. Recent Labs    03/16/24 0547 03/18/24 0513  WBC 13.4* 13.5*  HGB 11.5* 12.1*  HCT 34.4* 36.7*  PLT 510* 490*   Recent Labs    03/16/24 0547 03/18/24 0513  NA 135 133*  K 3.8 4.3  CL 104 101  CO2 22 26  GLUCOSE 151* 168*  BUN 26* 41*  CREATININE 1.03 1.24  CALCIUM  8.6* 8.9    Intake/Output Summary (Last 24 hours) at 03/18/2024 0934 Last data filed at 03/18/2024 0518 Gross per 24 hour  Intake 236 ml  Output 150 ml  Net 86 ml        Physical Exam: Vital Signs Blood pressure 137/69, pulse 64, temperature 98 F (36.7 C), temperature source Oral, resp. rate 19, height 5' 11 (1.803 m), weight 103.4 kg, SpO2 99%. Gen: no distress, normal appearing HEENT: oral mucosa pink and moist, NCAT Cardio: Reg rate Chest: normal effort, normal rate of breathing Abd: soft, non-distended Ext: no edema Psych: pleasant, normal affect Skin: intact Neuro: Cognition: AAO to person, place, time and event.   Language: Fluent, No substitutions or neoglisms. No dysarthria. Names 3/3 objects correctly.   Memory: Recalls 3/3 objects at 5 minutes. No apparent deficits   Insight: Good  insight into current condition.  Mood: Pleasant affect, appropriate mood.  Sensation: To light touch intact in BL UEs and LEs  Reflexes: 2+ in BL UE and LEs. Negative Hoffman's and babinski signs bilaterally.  CN: 2-12 grossly intact.  Coordination: No apparent tremors. No ataxia Spasticity: MAS 0 in all extremities.       Strength:                RUE: 5/5 SA, 5/5 EF, 5/5 EE, 5/5 WE, 5/5 FF, 5/5 FA                LUE:  5/5 SA, 5/5 EF, 5/5 EE, 5/5 WE, 5/5 FF, 5/5 FA                RLE: 5/5 HF, 5/5 KE, 5/5   DF, 5/5  EHL, 5/5  PF                 LLE:  5/5 HF      Assessment/Plan: 1. Functional deficits which require 3+ hours per day of interdisciplinary therapy in a comprehensive inpatient rehab setting. Physiatrist is providing close team supervision and 24 hour management of active medical problems listed below. Physiatrist and rehab team continue to assess barriers to discharge/monitor patient progress toward functional and medical goals  Care Tool:  Bathing    Body parts bathed by patient: Right arm, Left upper leg, Left arm, Chest, Abdomen, Face, Front perineal area, Buttocks, Right lower leg     Body parts n/a: Left lower leg   Bathing assist Assist Level: Moderate Assistance - Patient 50 - 74%     Upper Body Dressing/Undressing Upper body dressing  Upper body assist Assist Level: Supervision/Verbal cueing    Lower Body Dressing/Undressing Lower body dressing            Lower body assist Assist for lower body dressing: Maximal Assistance - Patient 25 - 49%     Toileting Toileting    Toileting assist Assist for toileting: Total Assistance - Patient < 25%     Transfers Chair/bed transfer  Transfers assist     Chair/bed transfer assist level: Minimal Assistance - Patient > 75%     Locomotion Ambulation   Ambulation assist      Assist level: Minimal Assistance - Patient > 75% Assistive device: Walker-rolling Max distance: 5 ft   Walk 10 feet activity   Assist  Walk 10 feet activity did not occur: Safety/medical concerns (L AKA/weakness/fatigue)        Walk 50 feet activity   Assist Walk 50 feet with 2 turns activity did not occur: Safety/medical concerns (L AKA/weakness/fatigue)         Walk 150 feet activity   Assist Walk 150 feet activity did not occur: Safety/medical concerns (L AKA/weakness/fatigue)         Walk 10 feet on uneven surface  activity   Assist Walk 10 feet on uneven surfaces activity did not occur:  Safety/medical concerns (L AKA/weakness/fatigue)         Wheelchair     Assist Is the patient using a wheelchair?: Yes Type of Wheelchair: Manual    Wheelchair assist level: Moderate Assistance - Patient 50 - 74% Max wheelchair distance: 110 ft    Wheelchair 50 feet with 2 turns activity    Assist        Assist Level: Moderate Assistance - Patient 50 - 74%   Wheelchair 150 feet activity     Assist  Wheelchair 150 feet activity did not occur: Safety/medical concerns (weakness/fatigue)       Blood pressure 137/69, pulse 64, temperature 98 F (36.7 C), temperature source Oral, resp. rate 19, height 5' 11 (1.803 m), weight 103.4 kg, SpO2 99%.  Medical Problem List and Plan: 1. 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  Antithrombotics: -DVT/anticoagulation:  Pharmaceutical: Xarelto  20mg  daily             -antiplatelet therapy: n/a 3. Pain Management: Denies any pain but starting to have phantom limb sensation --tylenol  prn mild pain and oxycodone  prn severe pain.  -12/13 patient reports some mild phantom pain but overall under control continue to monitor for now   4. Mood/Behavior/Sleep: LCSW to follow for evaluation and support. Team to provide ego support as has been having a difficult time coming to grips with limb loss -Ativan  0.5mg  nightly scheduled for insomnia (has helped when given). Very anxious about lack of sleep             -antipsychotic agents: N/A             -Melatonin 5mg  nightly   5. Neuropsych/cognition: This patient is capable of making decisions on his own behalf.   6. Skin/Wound Care: Monitor wound for healing.  Juven and vitamin C  added   7. Fluids/Electrolytes/Nutrition: Monitor I/O. Daily weights to monitor for signs of overload. Continue  vitamins/supplements. Protonix  40mg  daily.  -12/13 Tokosyn- 40 mEq potassium PO x 1 for K 3.8 ordered after discussion with pharmacy   8.  L-AKA for limb ischemia: Monitor for healing. Staple removal appt Jan 6th.   9.  T2DM: w/retinopathy OD: HGb A1c- 8.0. PTA on Jardiance , Lantus  25U bid and humalog  16U TID --currently on Insulin  glargine 45 units BID (adjusted to home regimen of b'fast and supper) with novolog  10 units TID --change SSI to moderate as anticipate BS to improve with intensiver rehab program.  -12/13 CBGs appear improved today 114 at 6 AM, 151 at 931, 123 at 1210.  Continue to monitor trend (CBGs and flowsheet) -03/17/24 CBGs good except 289 yesterday afternoon; continue monitoring trend   10.  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 On Decadron  2 mg daily. Monitor for neurologic symptoms. --per Radiation Onc noted 12/01-->plans were to keep patient on decadron  2 mg BID and possibly increase over radiation Tx depending on symptoms.  - 12/13 patient and spouse would like to know when chemo can be resumed, consider checking with oncologist next week Asked Pam to please consult surgery regarding whether we can remove remaining suture on left side of head   11. H/o A fib/flutter/NSVT: Monitor HR TID and for symptoms with activity             --on Tikosyn  500mcg BID, Toprol  25mg  daily and Xarelto    12.  CKD 3a: Baseline SCr @1 .3- 1.5 range for past couple of years             --acute on chronic renal failure improved with IVF and d/c of Jardiance .              - 12/13 BUN and creatinine improved today to 26/1.3   13.  Leucocytosis: WBC 14.9 at admission and has been has been in 13-14 range             --likely due to decadron  for cerebral edema.  --Continue to monitor for fevers and other signs of infection. -12/13 WBC still elevated at 13.4 continue to monitor   14. OSA: Compliant with CPAP use at home but refusing in hospital.  --wife to bring his  machine at home which will help with sleep.    15. Acute blood loss anemia: Stable overall 9-10 range.             --monitor for signs of bleeding with Xarelto  on board             -03/17/24 Hgb 11.5 yesterday, monitor routinely   16. Situational depression w/anxiety: continue lexapro  5mg  daily- added this admission   17. Class 1 obesity: BMI: 33.8 @ admission-->has been losing weight for past 2-3 months.    18. HLD: continue Lipitor  80mg  nightly and Zetia  10mg  daily   19. HTN: continue Benazepril  20mg  daily, Toprol  as above, IV magnesium  ordered 12/15    LOS: 3 days A FACE TO FACE EVALUATION WAS PERFORMED  Sven SQUIBB Prescilla Monger 03/18/2024, 9:34 AM

## 2024-03-18 NOTE — Progress Notes (Signed)
 Occupational Therapy Session Note  Patient Details  Name: Douglas Hunter MRN: [id number] Date of Birth: April 30, 1955  Today's Date: 03/18/2024 OT Individual Time: 1020-1100 OT Individual Time Calculation (min): 40 min    Short Term Goals: Week 1:  OT Short Term Goal 1 (Week 1): patient will complete LB dressing with mod A OT Short Term Goal 2 (Week 1): patient will complete toileting with LRAD with mod A OT Short Term Goal 3 (Week 1): patient will complete transfers with overall CGA  Skilled Therapeutic Interventions/Progress Updates:  Pt greeted sitting in [facility], son present during session, skilled OT session with focus on functional transfers, standing tolerance/balance, and BUE strengthening.   Pain: Pt with no reports of pain, OT offering intermediate rest breaks and positioning suggestions throughout session to address pain/fatigue and maximize participation/safety in session.   Functional Transfers: Pt propels WC from room>day room with supervision, increased time for management of turns. Stand-pivot from [employer] with Min A, OT educating on proper/safe distance from one surface to another.   Self Care Tasks: No needs voiced this session.  Therapeutic Activities: Simulated LB dressing in standing with use of resistive clips placed on lower back. Pt stands with CGA-Min A, unilateral support on RW, increased difficulty with LUE posterior reach due to RTC pain/stiffness. OT provides tactile cues at pelvis and L-shoulder for anterior pelvic shift and upright posture in standing. Pt able to stand for ~2 mins, x1 premature sitting onto WC, anticipate due to fear of falling. Pt is able to hop ~3 steps forward/backward during task for improved positioning.   Therapeutic Exercise: Pt performs 1x10 reps of rows and internal/external rotation with use of red theraband. Multimodal cuing required for effective posture/muscular activation.    Education: Pt re-educated on purpose of shinker and  appropriate wearing schedule. Encouraged patient to attempt independence with donning.   Pt remained sitting in recliner  with 4Ps assessed and immediate needs met. Pt continues to be appropriate for skilled OT intervention to promote further functional independence in ADLs/IADLs.   Therapy Documentation Precautions:  Precautions Precautions: Fall Recall of Precautions/Restrictions: Intact Restrictions Weight Bearing Restrictions Per Provider Order: Yes LLE Weight Bearing Per Provider Order: Non weight bearing Other Position/Activity Restrictions: AKA   Therapy/Group: Individual Therapy  Nereida Habermann, OTR/L, MSOT  03/18/2024, 6:25 AM

## 2024-03-18 NOTE — Progress Notes (Signed)
 Speech Language Pathology Daily Session Note  Patient Details  Name: Douglas Hunter MRN: [id number] Date of Birth: 1956/01/07  Today's Date: 03/18/2024 SLP Individual Time: 0800-0857 SLP Individual Time Calculation (min): 57 min  Short Term Goals: Week 1: SLP Short Term Goal 1 (Week 1): STGs = LTGs d/t ELOS  Skilled Therapeutic Interventions:   Pt and his wife greeted at bedside for tx targeting communication and cognition. Of note, he appeared slightly more confused than day of eval. Attempted category/letter task, though pt became frustrated and task was discontinued. He stated I've never been through anything like this and required education and errorless learning to recall reason for ST and cognitive-linguistic domains associated w/ the L hemisphere damage. He required modA for information processing throughout. He demonstrated improved success and reduced frustration w/ mildly complex responsive naming task and benefited from modA overall. Few perseverations noted and he was most stimulable to semantic and phonemic cues. He then completed a verbal thought formulation task ID similarities and differences between household items. He required min-modA. At the end of tx tasks, he was left in his bed w/ the alarm set and call light within reach. Spouse and son remained present upon SLP departure. Recommend cont ST per POC.    Pain Pain Assessment Pain Scale: 0-10 Pain Score: 0-No pain  Therapy/Group: Individual Therapy  [name] 03/18/2024, 8:21 AM

## 2024-03-18 NOTE — Progress Notes (Signed)
 Inpatient Rehabilitation  Patient information reviewed and entered into eRehab system by Burnard Mealing, OTR/L, Rehab Quality Coordinator.   Information including medical coding, functional ability and quality indicators will be reviewed and updated through discharge.

## 2024-03-18 NOTE — Progress Notes (Signed)
 Patient ID: Douglas Hunter, male   DOB: Sep 15, 1955, 68 y.o.   MRN: [id number] Met with the patient and son to review current medical situation, rehab process, team conference and plan of care. Discussed current medications and dietary modification recommendations for /HH/CMM diet and low albumin  level for wound/incision healing. Son requesting information on care of residual limb/amputee sock don/doff, etc.; nurse made aware.  Continue to follow along to address educational needs to facilitate preparation for discharge. Douglas Hunter

## 2024-03-19 LAB — BASIC METABOLIC PANEL WITH GFR
Anion gap: 9 (ref 5–15)
BUN: 34 mg/dL — ABNORMAL HIGH (ref 8–23)
CO2: 23 mmol/L (ref 22–32)
Calcium: 9 mg/dL (ref 8.9–10.3)
Chloride: 101 mmol/L (ref 98–111)
Creatinine, Ser: 1.22 mg/dL (ref 0.61–1.24)
GFR, Estimated: >60 mL/min (ref >60)
Glucose, Bld: 139 mg/dL — ABNORMAL HIGH (ref 70–99)
Potassium: 4.2 mmol/L (ref 3.5–5.1)
Sodium: 133 mmol/L — ABNORMAL LOW (ref 135–145)

## 2024-03-19 LAB — MAGNESIUM: Magnesium: 2.1 mg/dL (ref 1.7–2.4)

## 2024-03-19 MED ORDER — INSULIN GLARGINE 100 UNIT/ML ~~LOC~~ SOLN
46.0000 [IU] | Freq: Two times a day (BID) | SUBCUTANEOUS | Status: DC
  Administered 2024-03-19 – 2024-03-25 (×13): 46 [IU] via SUBCUTANEOUS
  Filled 2024-03-19 (×14): qty 0.46

## 2024-03-19 MED ORDER — OXYCODONE-ACETAMINOPHEN 5-325 MG PO TABS: 1.0000 | ORAL_TABLET | ORAL | Status: DC | PRN

## 2024-03-19 NOTE — Progress Notes (Signed)
 PROGRESS NOTE   Subjective/Complaints: Patient requests shave and shower today- messaged OT Grounds pass placed No complaints this morning  ROS: denies pain, LBM 12/14   Objective:   No results found. Recent Labs    03/18/24 0513  WBC 13.5*  HGB 12.1*  HCT 36.7*  PLT 490*   Recent Labs    03/18/24 0513 03/19/24 0527  NA 133* 133*  K 4.3 4.2  CL 101 101  CO2 26 23  GLUCOSE 168* 139*  BUN 41* 34*  CREATININE 1.24 1.22  CALCIUM  8.9 9.0    Intake/Output Summary (Last 24 hours) at 03/19/2024 1044 Last data filed at 03/19/2024 0826 Gross per 24 hour  Intake 338 ml  Output --  Net 338 ml        Physical Exam: Vital Signs Blood pressure 139/85, pulse 65, temperature 97.9 F (36.6 C), temperature source Oral, resp. rate 19, height 5' 11 (1.803 m), weight 101.3 kg, SpO2 95%. Gen: no distress, normal appearing HEENT: oral mucosa pink and moist, NCAT Cardio: Reg rate Chest: normal effort, normal rate of breathing Abd: soft, non-distended Ext: no edema Psych: pleasant, normal affect Skin: intact Neuro: Cognition: AAO to person, place, time and event.   Language: Fluent, No substitutions or neoglisms. No dysarthria. Names 3/3 objects correctly.   Memory: Recalls 3/3 objects at 5 minutes. No apparent deficits   Insight: Good  insight into current condition.  Mood: Pleasant affect, appropriate mood.  Sensation: To light touch intact in BL UEs and LEs  Reflexes: 2+ in BL UE and LEs. Negative Hoffman's and babinski signs bilaterally.  CN: 2-12 grossly intact.  Coordination: No apparent tremors. No ataxia Spasticity: MAS 0 in all extremities.       Strength:                RUE: 5/5 SA, 5/5 EF, 5/5 EE, 5/5 WE, 5/5 FF, 5/5 FA                LUE:  5/5 SA, 5/5 EF, 5/5 EE, 5/5 WE, 5/5 FF, 5/5 FA                RLE: 5/5 HF, 5/5 KE, 5/5  DF, 5/5  EHL, 5/5  PF                 LLE:  5/5 HF, stable 12/16       Assessment/Plan: 1. Functional deficits which require 3+ hours per day of interdisciplinary therapy in a comprehensive inpatient rehab setting. Physiatrist is providing close team supervision and 24 hour management of active medical problems listed below. Physiatrist and rehab team continue to assess barriers to discharge/monitor patient progress toward functional and medical goals  Care Tool:  Bathing    Body parts bathed by patient: Right arm, Left upper leg, Left arm, Chest, Abdomen, Face, Front perineal area, Buttocks, Right lower leg     Body parts n/a: Left lower leg   Bathing assist Assist Level: Moderate Assistance - Patient 50 - 74%     Upper Body Dressing/Undressing Upper body dressing        Upper body assist Assist Level: Supervision/Verbal cueing  Lower Body Dressing/Undressing Lower body dressing            Lower body assist Assist for lower body dressing: Maximal Assistance - Patient 25 - 49%     Toileting Toileting    Toileting assist Assist for toileting: Total Assistance - Patient < 25%     Transfers Chair/bed transfer  Transfers assist     Chair/bed transfer assist level: Minimal Assistance - Patient > 75%     Locomotion Ambulation   Ambulation assist      Assist level: Minimal Assistance - Patient > 75% Assistive device: Walker-rolling Max distance: 5 ft   Walk 10 feet activity   Assist  Walk 10 feet activity did not occur: Safety/medical concerns (L AKA/weakness/fatigue)        Walk 50 feet activity   Assist Walk 50 feet with 2 turns activity did not occur: Safety/medical concerns (L AKA/weakness/fatigue)         Walk 150 feet activity   Assist Walk 150 feet activity did not occur: Safety/medical concerns (L AKA/weakness/fatigue)         Walk 10 feet on uneven surface  activity   Assist Walk 10 feet on uneven surfaces activity did not occur: Safety/medical concerns (L AKA/weakness/fatigue)          Wheelchair     Assist Is the patient using a wheelchair?: Yes Type of Wheelchair: Manual    Wheelchair assist level: Supervision/Verbal cueing Max wheelchair distance: 110 ft    Wheelchair 50 feet with 2 turns activity    Assist        Assist Level: Supervision/Verbal cueing   Wheelchair 150 feet activity     Assist  Wheelchair 150 feet activity did not occur:  (based on max distance of 110 feet per PT documentation)   Assist Level: Supervision/Verbal cueing   Blood pressure 139/85, pulse 65, temperature 97.9 F (36.6 C), temperature source Oral, resp. rate 19, height 5' 11 (1.803 m), weight 101.3 kg, SpO2 95%.  Medical Problem List and Plan: 1. 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              This patient is capable of making decisions on his own behalf  2. Phantom limb sensation --tylenol  prn mild pain, decrease perocet to 1 tab q4H prn  3. Insomnia: continue Ativan . Very anxious about lack of sleep             -antipsychotic agents: N/A             -Melatonin 5mg  nightly   4. H/o A fib/flutter/NSVT: Monitor HR TID and for symptoms with activity             --on Tikosyn  500mcg BID, Toprol  25mg  daily and Xarelto   -supplement potassium and magnesium  to goal of 4 and 2 respectively   5.  L-AKA for limb ischemia: Monitor for healing. Staple removal appt Jan 6th.   6.  T2DM: w/retinopathy OD: HGb A1c- 8.0. PTA on Jardiance , Lantus  25U bid and  humalog  16U TID --currently on Insulin  glargine 45 units BID (adjusted to home regimen of b'fast and supper) with novolog  10 units TID --change SSI to moderate as anticipate BS to improve with intensiver rehab program.  -d/c feeding  supplement -increase lantus  to 46U BID   7.  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 On Decadron  2 mg daily. Monitor for neurologic symptoms. --per Radiation Onc noted 12/01-->plans were to keep patient on decadron  2 mg BID and possibly increase over radiation Tx depending on symptoms.  - 12/13 patient and spouse would like to know when chemo can be resumed, consider checking with oncologist next week   8.  CKD 3a: Baseline SCr @1 .3- 1.5 range for past couple of years             --acute on chronic renal failure improved with IVF and d/c of Jardiance .    9.  Leucocytosis: WBC 14.9 at admission and has been has been in 13-14 range             --likely due to decadron  for cerebral edema.    10. OSA: Compliant with CPAP use at home but refusing in hospital.  --wife to bring his machine at home which will help with sleep.    11. Acute blood loss anemia: Stable overall 9-10 range.  12. Situational depression w/anxiety: continue lexapro  5mg  daily- added this admission   13. Class 1 obesity: BMI: 33.8 @ admission-->has been losing weight for past 2-3 months.    14. HLD: continue Lipitor  80mg  nightly and Zetia  10mg  daily   15. HTN: continue Benazepril  20mg  daily, Toprol  as above, IV magnesium  ordered 12/15, magnesium  level reviewed and is normal 12/16    LOS: 4 days A FACE TO FACE EVALUATION WAS PERFORMED  Douglas Hunter 03/19/2024, 10:44 AM

## 2024-03-19 NOTE — Progress Notes (Signed)
 Physical Therapy Session Note  Patient Details  Name: Douglas Hunter MRN: [id number] Date of Birth: 10-23-1955  Today's Date: 03/19/2024 PT Individual Time: 1100-1155 and 1417-1500 PT Individual Time Calculation (min): 55 min and 43 min  Short Term Goals: Week 1:  PT Short Term Goal 1 (Week 1): STG=LTG due to ELOS  Skilled Therapeutic Interventions/Progress Updates:   Treatment Session 1 Received pt sitting in WC, pt agreeable to PT treatment, and denied any pain during session. Session with emphasis on functional mobility/transfers, generalized strengthening and endurance, DME, and limb loss education. Pt performed WC mobility 13ft using BUE and supervision - limited by IV catching on WC rim (therapist wrapped in coband for comfort) and WC veering to R making propulsion difficult. Pt performed squat<>pivot transfers to/off mat with CGA (pt able to set up transfer with min cues). Transferred into prone with supervision and performed the following LE exercises while stretching hip flexors and educating pt/wife on contracture prevention: -R knee flexion 1x10 1x8  -L hip extension 2x10 -prone<>prone on elbows x10 -supine shoulder extensions into physioball 2x10x5 second hold Pt transferred semi-reclined<>sitting EOM with supervision and discussed DME for D/C. Pt/wife wanting custom WC. Discussed with Penne from [employer] and determined that pt's insurance will only pay for either custom WC or prosthetic - wife plans on purchasing custom WC OOP. Also discussed amputee support group and peer support and pt very interested. Returned to room and concluded session with pt sitting in [facility] with all needs within reach and wife at bedside.   Treatment Session 2 Received pt semi-reclined in bed with wife at bedside. Pt agreeable to PT treatment and denied any pain during session. Session with emphasis on functional mobility/transfers, generalized strengthening and endurance, dynamic standing  balance/coordination, and limb loss education. Pt transferred semi-reclined<>sitting R EOB with HOB elevated and supervision. Donned R shoe with max A and performed squat<>pivot into WC with CGA. Pt transported to/from room in [facility] dependently for time management purposes. Pt performed seated RLE strengthening on Kinetron at 20 cm/sec for 1 imute progressing to 15 cm/sec for 1 minute x 3 trials with therapist providing manual counter resistance with emphasis on glute/quad strength.   Pt performed the following exercises with emphasis on UE/LE strength/ROM: -seated WC push ups 1x8 and 1x5 -seated hip abduction with red TB 2x12 -standing L hip flexion x10 -standing L hip abduction x10 (Pt able to stand with RW and min A throughout session). Provided pt with balanced amputee support group, amputee support group of the triad , general rehab timeline, and HEP. Returned to room and concluded session with pt sitting in [facility] with all needs within reach and wife at bedside.   Therapy Documentation Precautions:  Precautions Precautions: Fall Recall of Precautions/Restrictions: Intact Restrictions Weight Bearing Restrictions Per Provider Order: Yes LLE Weight Bearing Per Provider Order: Non weight bearing Other Position/Activity Restrictions: AKA  Therapy/Group: Individual Therapy Therisa HERO Zaunegger Therisa Stains PT, DPT 03/19/2024, 7:09 AM

## 2024-03-19 NOTE — Progress Notes (Signed)
 Speech Language Pathology Daily Session Note  Patient Details  Name: Douglas Hunter MRN: [id number] Date of Birth: 11/07/1955  Today's Date: 03/19/2024 SLP Individual Time: 9269-9188 SLP Individual Time Calculation (min): 41 min  Short Term Goals: Week 1: SLP Short Term Goal 1 (Week 1): STGs = LTGs d/t ELOS  Skilled Therapeutic Interventions: SLP conducted skilled therapy session targeting cognitive goals. Patient awake, alert, and agreeable to all tasks. SLP facilitated education re: current medication names, times, and purposes. Patient often alluded to not knowing new medications since hospital admission and remarks that wife currently assists with mental management of all information re: medication changes. SLP provided patient with visual aid to assist with ongoing carryover of information provided. SLP then facilitated mildly complex math-based problem solving task where patient interpreted word problems and solved them accurately with min-mod assist. Patient was left in room with call bell in reach and alarm set. SLP will continue to target goals per plan of care.        Pain  None  Therapy/Group: Individual Therapy  [name], M.A., CCC-SLP  [name] 03/19/2024, 9:11 AM

## 2024-03-19 NOTE — Progress Notes (Signed)
 Physical Therapy Session Note  Patient Details  Name: Douglas Hunter MRN: [id number] Date of Birth: Mar 27, 1956  Today's Date: 03/18/2024 PT Individual Time: 1427-1536 PT Individual Time Calculation (min): 69 min  Short Term Goals: Week 1:  PT Short Term Goal 1 (Week 1): STG=LTG due to ELOS  Skilled Therapeutic Interventions/Progress Updates:  Patient supine in bed on entrance to room. Patient alert and agreeable to PT session. Son present.  Patient with no pain complaint at start of session.  Therapeutic Activity: Bed Mobility: Pt performed supine > sit with supervision and no indication of pain. Transfers: Pt performed lateral scoot and squat pivot transfer requiring 2 efforts to reach w/c seat with overall CGA/ MinA. Same throughout session.   Performed squat pivot into recliner with setup as per home environment and pt is able to perform with CGA into w/c and CGA/ MinA back into w/c.   Wheelchair Mobility:  Pt propelled wheelchair 130 feet with supervision. Educated on not reaching back past 12 o'clock on wheel in order to preserve shoulder integrity. Short, even strokes preferred. Took over propel of chair with fatigue that pt relates from all previous sessions this day.   Educated pt in pivot point of w/c and space chair takes up anteriorly and posteriorly. Guided pt in slalom throughout six cones placed 5 ft apart. Performs fwd well and returns through cones backward with slow but careful movement and no touches to cones or walls. Then progressed to 45ft apart which will more mimic spacing in home environment. Pt is able to perform out and back fwd then bkwd with minimal touches to cones and one touch to wall.   Son shows picture of ramp built at home which shows limited length compared to rise of 2+ steps. Pt wheeled to ramp (which is lower than home ramp) and educated pt on propel up slope with BUE and RLE. Guided in need for forward lean to maintain balance. Then guided in use  of RLE and LUE to hold in middle of ramp and set R hand brake. Then to set L hand brake in the event of fatigue while self-propelling up ramp. Next guided in succession to hold positoin while releasing brakes. Pt able to perform with supervision and no chair movement to set brakes, but then requires MinA to prevent movement with release of brakes.   Performs well with back lean on vc to maintain balance and controls decent well with BUE and RLE.   Patient supine in bed at end of session with brakes locked, bed alarm set, and all needs within reach.   Therapy Documentation Precautions:  Precautions Precautions: Fall Recall of Precautions/Restrictions: Intact Restrictions Weight Bearing Restrictions Per Provider Order: Yes LLE Weight Bearing Per Provider Order: Non weight bearing Other Position/Activity Restrictions: AKA  Pain:  No pain related during session. Does relate minimal phantom limb pain and educated on use of palmar pressure to assist with brain recognition of end of residual limb.   Therapy/Group: Individual Therapy  Mliss DELENA Milliner PT, DPT, CSRS 03/19/2024, 5:18 AM

## 2024-03-19 NOTE — Progress Notes (Signed)
 Patient ID: Douglas Hunter, male   DOB: 10/25/1955, 68 y.o.   MRN: [id number]  Douglas Hunter agrees to participate in our peer support program.

## 2024-03-19 NOTE — Progress Notes (Signed)
 Occupational Therapy Session Note  Patient Details  Name: Douglas Hunter MRN: [id number] Date of Birth: 1955/10/22  Today's Date: 03/19/2024 OT Individual Time: 0902-1000 OT Individual Time Calculation (min): 58 min    Short Term Goals: Week 1:  OT Short Term Goal 1 (Week 1): patient will complete LB dressing with mod A OT Short Term Goal 2 (Week 1): patient will complete toileting with LRAD with mod A OT Short Term Goal 3 (Week 1): patient will complete transfers with overall CGA Week 2:     Skilled Therapeutic Interventions/Progress Updates:    1:1 Pt received in the bed. Pt participated in bathing at shower level. Pt able to come to EOB with contact guard. Pt performed squat pivot transfer bed to w/c with contact guard. Pt stood with contact guard for therapist to doff underwear.  Pt performed contact guard transfer into shower and bathed all parts. Pt intermittent has trouble getting down to his right foot. Pt did stand in the shower with the grab bar and washed his buttocks with min A for balance. However at the sink with at least one upper body support pt was unsure about his ability to pull up pants in standing requiring A. Pt don sock on foot and shoe with min A. Pt able to shave and perform grooming at the sink with setup.  Wife did show pictures of bathroom setup and pt would be able to transfer squat pivot directly onto the built in bench from w/c and scoot over to be inside the shower.  Ramp outside the home is already setup. Wife was on the phone with insurance about equipment and discussed purchasing a manual w/ c if insurance doesn't pay.  Pt left at the sink finishing grooming with wife at his side.   Therapy Documentation Precautions:  Precautions Precautions: Fall Recall of Precautions/Restrictions: Intact Restrictions Weight Bearing Restrictions Per Provider Order: Yes LLE Weight Bearing Per Provider Order: Non weight bearing Other Position/Activity Restrictions:  AKA  Pain:     Therapy/Group: Individual Therapy  Claudene Nest [facility] 03/19/2024, 11:57 AM

## 2024-03-20 ENCOUNTER — Ambulatory Visit: Admitting: Speech Pathology

## 2024-03-20 DIAGNOSIS — R413 Other amnesia: Secondary | ICD-10-CM

## 2024-03-20 DIAGNOSIS — I70222 Atherosclerosis of native arteries of extremities with rest pain, left leg: Secondary | ICD-10-CM | POA: Diagnosis not present

## 2024-03-20 LAB — MAGNESIUM: Magnesium: 1.8 mg/dL (ref 1.7–2.4)

## 2024-03-20 MED ORDER — LORAZEPAM 0.5 MG PO TABS
0.5000 mg | ORAL_TABLET | Freq: Every day | ORAL | Status: DC | PRN
  Administered 2024-03-22 – 2024-03-25 (×5): 0.5 mg via ORAL
  Filled 2024-03-20 (×5): qty 1

## 2024-03-20 NOTE — Progress Notes (Signed)
 Hypoglycemic Event  CBG: 67  Treatment: 4 oz juice/soda  Symptoms: None  Follow-up CBG: Time:0600 CBG Result:125  Possible Reasons for Event: Unknown  Comments/MD notified:yes    Douglas Hunter E Tanaisha Pittman

## 2024-03-20 NOTE — Discharge Instructions (Signed)
 Inpatient Rehab Discharge Instructions  [facility] Discharge date and time: 03/26/24   Activities/Precautions/ Functional Status: Activity: no lifting, driving, or strenuous exercise till cleared by MD Diet: cardiac diet and diabetic diet Wound Care: keep wound clean and dry   Functional status:  ___ No restrictions     ___ Walk up steps independently _X__ 24/7 supervision/assistance   ___ Walk up steps with assistance ___ Intermittent supervision/assistance  ___ Bathe/dress independently ___ Walk with walker     ___ Bathe/dress with assistance ___ Walk Independently    ___ Shower independently ___ Walk with assistance    _X__ Shower with assistance _X__ No alcohol     ___ Return to work/school ________  Special Instructions: Need to wear CPAP when sleeping or napping as this helps with your heart/oxygen levels.   2. Insulin  has changed--be sure to follow current orders and follow up with endocrinologist.   My questions have been answered and I understand these instructions. I will adhere to these goals and the provided educational materials after my discharge from the hospital.  Patient/Caregiver Signature _______________________________ Date __________  Clinician Signature _______________________________________ Date __________  Please bring this form and your medication list with you to all your follow-up doctor's appointments.      COMMUNITY REFERRALS UPON DISCHARGE:    Outpatient: PT     OT                 Agency: [facility] Phone: [phone]              Appointment Date/Time: *Please expect follow-up within 7-10 business days to schedule your appointment. If you have not received follow-up, be sure to contact the site directly.*

## 2024-03-20 NOTE — Progress Notes (Signed)
 Occupational Therapy Note  Patient Details  Name: Douglas Hunter MRN: [id number] Date of Birth: [birth year]   Occupational Therapist participated in the interdisciplinary team conference, providing clinical information regarding the patient's current status, treatment goals, and weekly focus, including any barriers that need to be addressed. Please see the Inpatient Rehabilitation Team Conference and Plan of Care Update for further details.    Shabnam Ladd M 03/20/2024, 11:37 AM

## 2024-03-20 NOTE — Plan of Care (Signed)
°  Problem: Consults Goal: RH LIMB LOSS PATIENT EDUCATION Description: Description: See Patient Education module for eduction specifics. Outcome: Progressing Goal: Skin Care Protocol Initiated - if Braden Score 18 or less Description: If consults are not indicated, leave blank or document N/A Outcome: Progressing Goal: Nutrition Consult-if indicated Outcome: Progressing Goal: Diabetes Guidelines if Diabetic/Glucose > 140 Description: If diabetic or lab glucose is > 140 mg/dl - Initiate Diabetes/Hyperglycemia Guidelines & Document Interventions  Outcome: Progressing   Problem: RH SKIN INTEGRITY Goal: RH STG SKIN FREE OF INFECTION/BREAKDOWN Description: Manage w min assist Outcome: Progressing   Problem: RH SAFETY Goal: RH STG ADHERE TO SAFETY PRECAUTIONS W/ASSISTANCE/DEVICE Description: STG Adhere to Safety Precautions With cues Assistance/Device. Outcome: Progressing   Problem: RH PAIN MANAGEMENT Goal: RH STG PAIN MANAGED AT OR BELOW PT'S PAIN GOAL Description: Pain < 4 with prns Outcome: Progressing   Problem: RH KNOWLEDGE DEFICIT LIMB LOSS Goal: RH STG INCREASE KNOWLEDGE OF SELF CARE AFTER LIMB LOSS Description: Patient and wife will be able to manage care at discharge using educational resources for medications, dietary modification and skin care independently Outcome: Progressing   Problem: Education: Goal: Ability to describe self-care measures that may prevent or decrease complications (Diabetes Survival Skills Education) will improve Outcome: Progressing Goal: Individualized Educational Video(s) Outcome: Progressing   Problem: Coping: Goal: Ability to adjust to condition or change in health will improve Outcome: Progressing   Problem: Fluid Volume: Goal: Ability to maintain a balanced intake and output will improve Outcome: Progressing   Problem: Health Behavior/Discharge Planning: Goal: Ability to identify and utilize available resources and services will  improve Outcome: Progressing Goal: Ability to manage health-related needs will improve Outcome: Progressing   Problem: Metabolic: Goal: Ability to maintain appropriate glucose levels will improve Outcome: Progressing   Problem: Nutritional: Goal: Maintenance of adequate nutrition will improve Outcome: Progressing Goal: Progress toward achieving an optimal weight will improve Outcome: Progressing   Problem: Skin Integrity: Goal: Risk for impaired skin integrity will decrease Outcome: Progressing   Problem: Tissue Perfusion: Goal: Adequacy of tissue perfusion will improve Outcome: Progressing

## 2024-03-20 NOTE — Progress Notes (Signed)
 Speech Language Pathology Daily Session Note  Patient Details  Name: Douglas Hunter MRN: [id number] Date of Birth: 08-Nov-1955  Today's Date: 03/20/2024 SLP Individual Time: 0803-0901 SLP Individual Time Calculation (min): 58 min  Short Term Goals: Week 1: SLP Short Term Goal 1 (Week 1): STGs = LTGs d/t ELOS  Skilled Therapeutic Interventions:  Patient was seen in am to address cognitive re- training. Pt was alert and seated upright in bed upon SLP arrival. He was agreeable for session. Pt verbalized recent medical hx including report of brain tumor and recent left AKA. He reports speech appears back to baseline though son present later in session reports speech has improved significantly though is not back to baseline. 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 Pt noted to use filler words or refer to items as things. SLP instructed pt in strategies of circumlocution and provided examples. He was subsequently engaged in a structured generative naming and circumlocution task where he warranted min A for generative naming and sup- min A for use of circumlocution strategy. At conclusion of session, pt was left upright in bed with call button within reach and bed alarm active. SLP to continue POC.    Pain Pain Assessment Pain Scale: 0-10 Pain Score: 0-No pain  Therapy/Group: Individual Therapy  [name] 03/20/2024, 8:05 AM

## 2024-03-20 NOTE — Progress Notes (Signed)
 Occupational Therapy Session Note  Patient Details  Name: Douglas Hunter MRN: [id number] Date of Birth: [birth year]  Today's Date: 03/20/2024 OT Individual Time: 1120-1200 OT Individual Time Calculation (min): 40 min    Short Term Goals: Week 1:  OT Short Term Goal 1 (Week 1): patient will complete LB dressing with mod A OT Short Term Goal 2 (Week 1): patient will complete toileting with LRAD with mod A OT Short Term Goal 3 (Week 1): patient will complete transfers with overall CGA  Skilled Therapeutic Interventions/Progress Updates:    Pt received sitting up with no c/o pain, agreeable to OT session. He propelled w/c to the 100 ft with min cueing for techniques to maximize efficiency. He completed 2 trials of functional mobility, first trial was 10 ft, second trial was 25 ft! He required CGA- mod A with the RW, cueing provided for body mechanics to increase foot clearance. Carryover to home mobility and ADLs performance. Discussed carryover to home, rest breaks at home and building up endurance. He then completed seated BUE strengthening circuit with a 4 lb dowel, bicep curls and tricep push downs and then a level 3 theraband to complete lat pull downs. 3x10 repetitions. Carryover to increase UE support on the RW during mobility. He returned to his room and was left sitting up with all needs met.   Therapy Documentation Precautions:  Precautions Precautions: Fall Recall of Precautions/Restrictions: Intact Restrictions Weight Bearing Restrictions Per Provider Order: Yes LLE Weight Bearing Per Provider Order: Non weight bearing Other Position/Activity Restrictions: AKA  Therapy/Group: Individual Therapy  Nena VEAR Moats 03/20/2024, 8:59 AM

## 2024-03-20 NOTE — Progress Notes (Signed)
 Physical Therapy Note  Patient Details  Name: Douglas Hunter MRN: [id number] Date of Birth: July 29, 1955 Today's Date: 03/20/2024   Physical Therapist participated in the interdisciplinary team conference, providing clinical information regarding the patients current status, treatment goals, and weekly focus, including any barriers that need to be addressed. Please see the Inpatient Rehabilitation Team Conference and Plan of Care Update for further details.   Douglas Hunter Douglas Stains PT, DPT 03/20/2024, 11:36 AM

## 2024-03-20 NOTE — Progress Notes (Signed)
 PROGRESS NOTE   Subjective/Complaints: No new complaints this morning Team conference today D/c date of 12/23 determined, discussed with wife   ROS: denies pain, LBM 12/16, +situational depression   Objective:   No results found. Recent Labs    03/18/24 0513  WBC 13.5*  HGB 12.1*  HCT 36.7*  PLT 490*   Recent Labs    03/18/24 0513 03/19/24 0527  NA 133* 133*  K 4.3 4.2  CL 101 101  CO2 26 23  GLUCOSE 168* 139*  BUN 41* 34*  CREATININE 1.24 1.22  CALCIUM  8.9 9.0    Intake/Output Summary (Last 24 hours) at 03/20/2024 1525 Last data filed at 03/20/2024 0727 Gross per 24 hour  Intake 118 ml  Output 950 ml  Net -832 ml        Physical Exam: Vital Signs Blood pressure 123/66, pulse 72, temperature 98.8 F (37.1 C), temperature source Oral, resp. rate 18, height 5' 11 (1.803 m), weight 102.4 kg, SpO2 95%.   Gen: no distress, normal appearing HEENT: oral mucosa pink and moist, NCAT Cardio: Reg rate Chest: normal effort, normal rate of breathing Abd: soft, non-distended Ext: no edema Psych: pleasant, normal affect Skin: LLE AKA well healed Neuro: Aox4, no cognitive deficits, sensation intact, strength 5/5 throughout Musculoskeletal: LLE AKA with shrinker in place      Assessment/Plan: 1. Functional deficits which require 3+ hours per day of interdisciplinary therapy in a comprehensive inpatient rehab setting. Physiatrist is providing close team supervision and 24 hour management of active medical problems listed below. Physiatrist and rehab team continue to assess barriers to discharge/monitor patient progress toward functional and medical goals  Care Tool:  Bathing    Body parts bathed by patient: Right arm, Left upper leg, Left arm, Chest, Abdomen, Face, Front perineal area, Buttocks   Body parts bathed by helper: Left lower leg Body parts n/a: Left lower leg   Bathing assist Assist  Level: Moderate Assistance - Patient 50 - 74%     Upper Body Dressing/Undressing Upper body dressing   What is the patient wearing?: Pull over shirt    Upper body assist Assist Level: Supervision/Verbal cueing    Lower Body Dressing/Undressing Lower body dressing      What is the patient wearing?: Ace wrap/stump shrinker, Underwear/pull up, Pants     Lower body assist Assist for lower body dressing: Minimal Assistance - Patient > 75%     Toileting Toileting    Toileting assist Assist for toileting: Total Assistance - Patient < 25%     Transfers Chair/bed transfer  Transfers assist     Chair/bed transfer assist level: Contact Guard/Touching assist     Locomotion Ambulation   Ambulation assist      Assist level: Minimal Assistance - Patient > 75% Assistive device: Walker-rolling Max distance: 5 ft   Walk 10 feet activity   Assist  Walk 10 feet activity did not occur: Safety/medical concerns (L AKA/weakness/fatigue)        Walk 50 feet activity   Assist Walk 50 feet with 2 turns activity did not occur: Safety/medical concerns (L AKA/weakness/fatigue)         Walk 150 feet activity  Assist Walk 150 feet activity did not occur: Safety/medical concerns (L AKA/weakness/fatigue)         Walk 10 feet on uneven surface  activity   Assist Walk 10 feet on uneven surfaces activity did not occur: Safety/medical concerns (L AKA/weakness/fatigue)         Wheelchair     Assist Is the patient using a wheelchair?: Yes Type of Wheelchair: Manual    Wheelchair assist level: Supervision/Verbal cueing Max wheelchair distance: 110 ft    Wheelchair 50 feet with 2 turns activity    Assist        Assist Level: Supervision/Verbal cueing   Wheelchair 150 feet activity     Assist  Wheelchair 150 feet activity did not occur:  (based on max distance of 110 feet per PT documentation)   Assist Level: Supervision/Verbal cueing    Blood pressure 123/66, pulse 72, temperature 98.8 F (37.1 C), temperature source Oral, resp. rate 18, height 5' 11 (1.803 m), weight 102.4 kg, SpO2 95%.  Medical Problem List and Plan: 1. 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              This patient is capable of making decisions on his own behalf  2. Phantom limb sensation --tylenol  prn mild pain, decrease perocet to 1 tab q4H prn  3. Insomnia: continue Ativan . Very anxious about lack of sleep             -antipsychotic agents: N/A             -Melatonin 5mg  nightly   4. H/o A fib/flutter/NSVT: Monitor HR TID and for symptoms with activity             --on Tikosyn  500mcg BID, Toprol  25mg  daily and Xarelto   -supplement potassium and magnesium  to goal of 4 and 2 respectively   5.  L-AKA for limb ischemia: Monitor for healing. Staple removal appt Jan 6th.   6.  T2DM: w/retinopathy OD: HGb A1c- 8.0. PTA on Jardiance , Lantus  25U bid and humalog  16U TID --currently on Insulin  glargine 45 units BID (adjusted to home regimen of b'fast and supper) with novolog  10 units TID --change SSI to moderate as anticipate BS to improve with intensiver rehab program.  -d/c feeding supplement -increase lantus  to 46U BID   7.  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 On Decadron  2 mg daily. Monitor for neurologic symptoms. --per Radiation Onc noted 12/01-->plans were to keep patient on decadron  2 mg BID and possibly increase over radiation Tx depending on symptoms.  - 12/13 patient and spouse would like to know when chemo can be resumed, consider checking with oncologist next week   8.  CKD 3a: Baseline SCr @1 .3- 1.5 range for past couple of years              --acute on chronic renal failure improved with IVF and d/c of Jardiance .    9.  Leucocytosis: WBC 14.9 at admission and has been has been in  13-14 range             --likely due to decadron  for cerebral edema.    10. OSA: Compliant with CPAP use at home but refusing in hospital.  --wife to bring his machine at home which will help with sleep.    11. Acute blood loss anemia: Stable overall 9-10 range. Repeat Hgb tomorrow  12. Situational depression w/anxiety: continue lexapro  5mg  daily- added this admission   13. Class 1 obesity: BMI: 33.8 @ admission-->has been losing weight for past 2-3 months.    14. HLD: continue Lipitor  80mg  nightly and Zetia  10mg  daily   15. HTN: continue Benazepril  20mg  daily, Toprol  as above, IV magnesium  ordered 12/15, magnesium  level reviewed and is normal 12/16, magnesium  level ordered 12/17    LOS: 5 days A FACE TO FACE EVALUATION WAS PERFORMED  Sven P Dariyah Garduno 03/20/2024, 3:25 PM

## 2024-03-20 NOTE — Progress Notes (Signed)
 Physical Therapy Session Note  Patient Details  Name: Douglas Hunter MRN: [id number] Date of Birth: 08/17/1955  Today's Date: 03/20/2024 PT Individual Time: [phone] and [phone] PT Individual Time Calculation (min): 42 min and 55 min  Short Term Goals: Week 1:  PT Short Term Goal 1 (Week 1): STG=LTG due to ELOS  Skilled Therapeutic Interventions/Progress Updates:   Treatment Session 1 Received pt sitting in WC, pt agreeable to PT treatment, and denied any pain during session. Session with emphasis on functional mobility/transfers, generalized strengthening and endurance, dynamic standing balance/coordination, and limb loss education. Provided pt with home measurement sheet and pt transferred semi-reclined<>sitting R EOB with HOB elevated and supervision. Donned R shoe with max A and performed squat<>pivot into WC with CGA.  Pt performed WC mobility 18ft x 2 trials using BUE and supervision to/from dayroom. Pt able to set up transfer to/from mat with min cues and performed squat<>pivot on/off mat with CGA. Transitioned into supine and performed the following exercises with emphasis on LE strength, ROM, and contracture prevention: -R single leg bridge 3x8 -LLE SLR 2x10 -LLE hip abduction 2x10 -R sidelying L hip extension 2x10 Returned to room and concluded session with pt sitting in [facility] with all needs within reach and family at bedside.   Treatment Session 2 Received pt semi-reclined in bed with wife and son at bedside. Pt agreeable to PT treatment and denied any pain during session. Session with emphasis on functional mobility/transfers, generalized strengthening and endurance, dynamic standing balance/coordination, and limb loss education. [name] from [place] present with new shrinker. Removed old shrinker and donned and donned new black AKA shrinker with rubber grips with max A. Pt transferred semi-reclined<>sitting R EOB with HOB elevated and supervision. Donned R shoe with max A and  performed squat<>pivot transfer into WC with CGA. Pt performed WC mobility 15ft using BUE and supervision to dayroom with emphasis on UE strength/coordination.   Pt transferred WC<>mat via stand<>pivot with RW and min A (noted RLE giving put resulting in posterior LOB onto mat). Stood from [facility] with RW and CGA/min A (cues for hand placement) and performed standing L hip abduction 2x10 and L hip extension 2x10 with emphasis on LE strength/ROM. Pt then performed the following seated exercises with emphasis on UE/core strength: -seated trunk rotations with tidal tank 1 minute x 2 trials -overhead chest press with 7lb dowel 2x15 -horizontal chest press with 5lb dowel 2x10 -tricep extensions on yoga blocks 2x8  Transitioned into prone with supervision and performed the following exercises with emphasis on stretching hip flexors and LE strength/ROM: -prone L hip extension 2x10 -prone R hamstring curls 2x10 -prone<>prone on elbows 10x5 second hold  Pt transferred into sitting EOM with supervision and ++ time/effort then performed squat<>pivot into WC with CGA and transported back to room dependently. Pt requested to return to bed and performed squat<>pivot into bed with CGA and transitioned into supine with supervision. Concluded session with pt semi-reclined in bed with all needs within reach awaiting peer support visitor.  Therapy Documentation Precautions:  Precautions Precautions: Fall Recall of Precautions/Restrictions: Intact Restrictions Weight Bearing Restrictions Per Provider Order: Yes LLE Weight Bearing Per Provider Order: Non weight bearing Other Position/Activity Restrictions: AKA  Therapy/Group: Individual Therapy Therisa HERO Zaunegger Therisa Stains PT, DPT 03/20/2024, 6:54 AM

## 2024-03-20 NOTE — Consult Note (Signed)
 Neuropsychological Consultation Comprehensive Inpatient Rehab   Patient:   Douglas Hunter   DOB:   1956/01/08  MR Number:  [id number]  Location:  [facility] [address] [place] [address] Dept: [phone] Loc: [phone]           Date of Service:   03/20/2024  Start Time:   3 PM End Time:   4 PM  Provider/Observer:  Norleen Asa, Psy.D.       Clinical Neuropsychologist       Billing Code/Service: [phone]  Reason for Service:    Douglas Hunter is a 68 year old male referred for neuropsychological consultation during his ongoing admission to the comprehensive inpatient rehabilitation unit.  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 The assessment aims to clarify cognitive status, identify any deficits, and guide rehabilitation planning.  Coping and adjustment issues and managing recent diagnoses of glial cell blastoma as well as recent left AKA.  Presenting Concerns: Douglas Hunter reports a recent episode that occurred before the most significant development of problems with his left leg developed.  This includes episode of confusion and disorientation, which was the initial presentation of his brain tumor. He describes an inability to decide what to eat at a restaurant and feeling that something was wrong. His wife reports that she was out of town with one of their adult son but she and her son became very concerned as he had cognitive difficulties and confusion when they talked on the phone/text.  A neighbor was asked to go by and check on him and then EMS was called.  During this episode, he could not remember her name. He also reports ongoing difficulty with memory for timelines and recent events. He is experiencing an adjustment reaction to the limb loss, including  emotional distress.  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 He underwent a craniotomy for tumor resection. He has a history of speech difficulties postsurgery. He has had three sessions of speech therapy. He is scheduled for chemotherapy and radiation, which has been delayed due to the recent amputation. Medical: History of coronary artery disease, atrial flutter, paroxysmal atrial fibrillation, and type 2 diabetes mellitus. He developed left lower extremity pain on 02/09/2024, which was found to be due to occlusion of the left femoral, popliteal, posterior tibial, and peroneal arteries. He underwent a thrombectomy and fasciotomy on 02/09/2024. 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 Post-operatively, he experienced acute on chronic renal failure, which was treated with IV fluids. His blood sugars have been labile, requiring titration of insulin  glargine. He is currently on Xarelto . He reports no pain from the amputation site.  Functional Capacity: He is currently requiring minimal to moderate assistance with activities of daily living. He requires multimodal cues for activities and minimal assistance with sit-to-stand transfers. He is awaiting a prosthetic fitting, which is planned for approximately 3-4 months post-operatively. He is independent with transfers to a wheelchair. 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  Mental State Examination: Appearance and Behaviour: Appeared well-groomed and was cooperative throughout the interview. He maintained appropriate eye contact. Speech: Speech showed some significant impairments with clear word-finding difficulties, dysarthria and sentence structure deficits consistent with findings  pre and post craniotomy.  He is able to adequately express  himself as well as maintaining good receptive language capacity. Mood and Affect: Reported feeling emotional and overwhelmed at times. Affect was appropriate to the content of the discussion, though he became tearful when discussing his wife's feelings of guilt for not being at home when his symptoms first presented. Thought Process/Content: Thought process was linear and goal-directed. No evidence of disorganization or psychosis. He expressed some confusion regarding the sequence of recent events. Cognition: He was oriented to person and place. Attention appeared to be sustained. Recent memory for the sequence of recent hospitalizations and procedures is impaired. Remote memory appears intact. Verbal fluency was within normal limits. Insight and Judgement: Insight into his medical conditions appears to be developing. He demonstrates an understanding of the need for rehabilitation and future prosthetic use. Judgement appears to be fair.  Clinical Impressions: Mr. Rill presents with cognitive difficulties, particularly affecting recent memory and executive functioning, which are likely secondary to his temporal glioblastoma. He is also experiencing a significant adjustment reaction to his recent left above-knee amputation, with associated emotional distress. He is engaged in his rehabilitation but may be at risk for becoming overwhelmed.   Recommendations and Next Steps:  Psychoeducation was provided to Douglas Hunter and his wife regarding phantom limb syndrome, nature and expected symptoms associated with his glial cell blastoma/resection and beginning of interventions.  Today we worked on coping and adjustment issues with as straightforward a communication regarding the prognosis regarding his medical status as possible.  During today's visit, the patient's wife made it clear that the full nature of his brain tumor and what that means has not been completely explained to the patient and he has primary  been focused on what is happening to his leg.  The patient is aware he has a brain tumor that will require further ongoing care with the complete nature of his medical status has not been presented to the patient as that was planned to happen just before the development of his vascular issues in his left leg.    Continue to monitor for signs of emotional distress or being overwhelmed, and encourage him to communicate with the team if he feels he is hitting a wall.  Electronically Signed   _______________________ Norleen Asa, Psy.D. Clinical Neuropsychologist

## 2024-03-20 NOTE — Patient Care Conference (Signed)
 Inpatient RehabilitationTeam Conference and Plan of Care Update Date: 03/20/2024   Time: 11:34 AM    Patient Name: Douglas Hunter      Medical Record Number: [id number]  Date of Birth: Jul 08, 1955 Sex: Male         Room/Bed: 4W21C/4W21C-01 Payor Info: Payor: [employer] / Plan: [employer] MEDICARE / Product Type: *No Product type* /    Admit Date/Time:  03/15/2024  7:13 PM  Primary Diagnosis:  Critical limb ischemia of left lower extremity [facility])  Hospital Problems: Principal Problem:   Critical limb ischemia of left lower extremity [facility])    Expected Discharge Date: Expected Discharge Date: 03/27/24  Team Members Present: Physician leading conference: Dr. Sven Elks Social Worker Present: Graeme Jude, LCSW Nurse Present: Barnie Ronde, RN PT Present: Therisa Stains, PT OT Present: Monica Peacock, OT SLP Present: Recardo Mole, SLP     Current Status/Progress Goal Weekly Team Focus  Bowel/Bladder   Currently continent with B/B LBM 12/16   Will ramain continent with B/B   Assist with toilet needs qshift and prn    Swallow/Nutrition/ Hydration               ADL's   CGA to min for transfers, UB SU, LB max, toileting max to total   Supervision   transfers, wc mobility, sitting balance, AE use - discharge planning    Mobility   bed mobility supervision, squat<>pivot, sit<>stands, and stand<>pivot transfers with min A, WC mobility 156ft supervision   supervision/mod I  barriers: pain, limb loss education, weakness/deconditioning    Communication                Safety/Cognition/ Behavioral Observations  min assist for recall of detailed daily information, complex iADL completion (medication management, etc.) - suspect is more reliant on wife at baseline for iADLs than reported during evaluation, slowed processing speed, intellectual awareness of deficits   supervision   medication management, finance management, processing speed, awareness of  deficits    Pain   Denies pain at this time   Will be free from pain   Asess pain qshift/prn    Skin   Surgical incision to left leg (Left AKA)Shrinker in place   Will maintain skin intergrity with no s/s of infection at incision site.  Assess incision site for s/s of infection and promote healing      Discharge Planning:  Patient discharging home with spouse, [name] their [place]. Home equipped with accommodations (ramp, shower chair). Would like to have OP therapy at [facility] since that is where the pateint goes for radiation. Declined neuropsych.  Awaiting therapy follow-up recommendations.   Team Discussion: Patient admitted post critical limb ischemia with left AKA.  Patient limited by mil - moderate linguistic, cognitive deficits.  Reliant on spouse for IADLs PTA.  Patient on target to meet rehab goals: yes, currently needs supervision for bed mobility and min assist for transfers. Needs supervision for upper body care and max assist for lower body care.   *See Care Plan and progress notes for long and short-term goals.   Revisions to Treatment Plan:  MD adjusting insulin  Lexapro  added for depression Neuro - psych consult Peer support referral   Teaching Needs: Safety, medications, skin care, residual limb care, dietary modification, transfers, toileting ,etc.   Current Barriers to Discharge: Decreased caregiver support and Radiation Therapy pending  Possible Resolutions to Barriers: Family education Home Exercise Program W/C     Medical Summary Current Status: class 1 obesity,  HTN, situational depression, glioblastoma, HLD  Barriers to Discharge: Medical stability  Barriers to Discharge Comments: class 1 obesity, HTN, situational depression, glioblastoma, HLD Possible Resolutions to [employer] Focus: provide dietary education, continue to monitor BP TID, lexapro  started, peer support set up, will discuss with oncology establishing outpatient radiation  treatments, continue Lipitor    Continued Need for Acute Rehabilitation Level of Care: The patient requires daily medical management by a physician with specialized training in physical medicine and rehabilitation for the following reasons: Direction of a multidisciplinary physical rehabilitation program to maximize functional independence : Yes Medical management of patient stability for increased activity during participation in an intensive rehabilitation regime.: Yes Analysis of laboratory values and/or radiology reports with any subsequent need for medication adjustment and/or medical intervention. : Yes   I attest that I was present, lead the team conference, and concur with the assessment and plan of the team.   [name] 03/20/2024, 12:41 PM

## 2024-03-21 ENCOUNTER — Other Ambulatory Visit: Payer: Self-pay

## 2024-03-21 LAB — CBC
HCT: 35.1 % — ABNORMAL LOW (ref 39.0–52.0)
Hemoglobin: 11.6 g/dL — ABNORMAL LOW (ref 13.0–17.0)
MCH: 30.8 pg (ref 26.0–34.0)
MCHC: 33 g/dL (ref 30.0–36.0)
MCV: 93.1 fL (ref 80.0–100.0)
Platelets: 446 K/uL — ABNORMAL HIGH (ref 150–400)
RBC: 3.77 MIL/uL — ABNORMAL LOW (ref 4.22–5.81)
RDW: 14.5 % (ref 11.5–15.5)
WBC: 14.1 K/uL — ABNORMAL HIGH (ref 4.0–10.5)
nRBC: 0 % (ref 0.0–0.2)

## 2024-03-21 LAB — MAGNESIUM: Magnesium: 2 mg/dL (ref 1.7–2.4)

## 2024-03-21 LAB — BASIC METABOLIC PANEL WITH GFR
Anion gap: 8 (ref 5–15)
BUN: 29 mg/dL — ABNORMAL HIGH (ref 8–23)
CO2: 25 mmol/L (ref 22–32)
Calcium: 9 mg/dL (ref 8.9–10.3)
Chloride: 101 mmol/L (ref 98–111)
Creatinine, Ser: 1.1 mg/dL (ref 0.61–1.24)
GFR, Estimated: >60 mL/min (ref >60)
Glucose, Bld: 87 mg/dL (ref 70–99)
Potassium: 4.3 mmol/L (ref 3.5–5.1)
Sodium: 134 mmol/L — ABNORMAL LOW (ref 135–145)

## 2024-03-21 LAB — GLUCOSE, CAPILLARY
Glucose-Capillary: 82 mg/dL (ref 70–99)
Glucose-Capillary: 311 mg/dL — ABNORMAL HIGH (ref 70–99)

## 2024-03-21 MED ORDER — LORAZEPAM 0.5 MG PO TABS
0.5000 mg | ORAL_TABLET | Freq: Every day | ORAL | Status: DC
  Administered 2024-03-21 – 2024-03-25 (×5): 0.5 mg via ORAL
  Filled 2024-03-21 (×6): qty 1

## 2024-03-21 MED ORDER — LORAZEPAM 0.5 MG PO TABS: 0.5000 mg | ORAL_TABLET | Freq: Every day | ORAL | Status: DC

## 2024-03-21 MED ORDER — MAGNESIUM OXIDE -MG SUPPLEMENT 400 (240 MG) MG PO TABS
200.0000 mg | ORAL_TABLET | Freq: Every day | ORAL | Status: DC
  Administered 2024-03-21 – 2024-03-22 (×2): 200 mg via ORAL
  Filled 2024-03-21 (×2): qty 1

## 2024-03-21 NOTE — Progress Notes (Signed)
 PROGRESS NOTE   Subjective/Complaints: No new complaints this morning Continues to have anxiety that is worst after supper, and insomnia, added low dose ativan  after supper  ROS: denies pain, LBM 12/16, +situational depression, +anxiety   Objective:   No results found. Recent Labs    03/21/24 0524  WBC 14.1*  HGB 11.6*  HCT 35.1*  PLT 446*   Recent Labs    03/19/24 0527 03/21/24 0524  NA 133* 134*  K 4.2 4.3  CL 101 101  CO2 23 25  GLUCOSE 139* 87  BUN 34* 29*  CREATININE 1.22 1.10  CALCIUM  9.0 9.0    Intake/Output Summary (Last 24 hours) at 03/21/2024 1140 Last data filed at 03/21/2024 0846 Gross per 24 hour  Intake 480 ml  Output 450 ml  Net 30 ml        Physical Exam: Vital Signs Blood pressure (!) 156/81, pulse 70, temperature 97.7 F (36.5 C), resp. rate 18, height 5' 11 (1.803 m), weight 100.4 kg, SpO2 99%.   Gen: no distress, normal appearing HEENT: oral mucosa pink and moist, NCAT Cardio: Reg rate Chest: normal effort, normal rate of breathing Abd: soft, non-distended Ext: no edema Psych: pleasant, normal affect Skin: LLE AKA well healed Neuro: Aox4, no cognitive deficits, sensation intact, strength 5/5 throughout Musculoskeletal: LLE AKA with shrinker in place, stable 12/18  Assessment/Plan: 1. Functional deficits which require 3+ hours per day of interdisciplinary therapy in a comprehensive inpatient rehab setting. Physiatrist is providing close team supervision and 24 hour management of active medical problems listed below. Physiatrist and rehab team continue to assess barriers to discharge/monitor patient progress toward functional and medical goals  Care Tool:  Bathing    Body parts bathed by patient: Right arm, Left arm, Chest, Abdomen, Front perineal area, Buttocks, Right upper leg, Left upper leg, Right lower leg   Body parts bathed by helper: Left lower leg Body parts  n/a: Left lower leg   Bathing assist Assist Level: Minimal Assistance - Patient > 75%     Upper Body Dressing/Undressing Upper body dressing   What is the patient wearing?: Pull over shirt    Upper body assist Assist Level: Supervision/Verbal cueing    Lower Body Dressing/Undressing Lower body dressing      What is the patient wearing?: Ace wrap/stump shrinker, Pants, Underwear/pull up     Lower body assist Assist for lower body dressing: Minimal Assistance - Patient > 75%     Toileting Toileting    Toileting assist Assist for toileting: Total Assistance - Patient < 25%     Transfers Chair/bed transfer  Transfers assist     Chair/bed transfer assist level: Contact Guard/Touching assist     Locomotion Ambulation   Ambulation assist      Assist level: Minimal Assistance - Patient > 75% Assistive device: Walker-rolling Max distance: 5 ft   Walk 10 feet activity   Assist  Walk 10 feet activity did not occur: Safety/medical concerns (L AKA/weakness/fatigue)        Walk 50 feet activity   Assist Walk 50 feet with 2 turns activity did not occur: Safety/medical concerns (L AKA/weakness/fatigue)  Walk 150 feet activity   Assist Walk 150 feet activity did not occur: Safety/medical concerns (L AKA/weakness/fatigue)         Walk 10 feet on uneven surface  activity   Assist Walk 10 feet on uneven surfaces activity did not occur: Safety/medical concerns (L AKA/weakness/fatigue)         Wheelchair     Assist Is the patient using a wheelchair?: Yes Type of Wheelchair: Manual    Wheelchair assist level: Supervision/Verbal cueing Max wheelchair distance: 110 ft    Wheelchair 50 feet with 2 turns activity    Assist        Assist Level: Supervision/Verbal cueing   Wheelchair 150 feet activity     Assist  Wheelchair 150 feet activity did not occur:  (based on max distance of 110 feet per PT  documentation)   Assist Level: Supervision/Verbal cueing   Blood pressure (!) 156/81, pulse 70, temperature 97.7 F (36.5 C), resp. rate 18, height 5' 11 (1.803 m), weight 100.4 kg, SpO2 99%.  Medical Problem List and Plan: 1. 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              This patient is capable of making decisions on his own behalf  2. Phantom limb sensation --tylenol  prn mild pain, decrease perocet to 1 tab q4H prn  3. Insomnia: continue Ativan . Very anxious about lack of sleep             -antipsychotic agents: N/A             -Melatonin 5mg  nightly   4. H/o A fib/flutter/NSVT: Monitor HR TID and for symptoms with activity             --on Tikosyn  500mcg BID, Toprol  25mg  daily and Xarelto   -supplement potassium and magnesium  to goal of 4 and 2 respectively   5.  L-AKA for limb ischemia: Monitor for healing. Staple removal appt Jan 6th.   6.  T2DM: w/retinopathy OD: HGb A1c- 8.0. PTA on Jardiance , Lantus  25U bid and humalog  16U TID --currently on Insulin  glargine 45 units BID (adjusted to home regimen of b'fast and supper) with novolog  10 units TID --change SSI to moderate as anticipate BS to improve with intensiver rehab program.  -d/c feeding supplement -increase lantus  to 46U BID -discussed that yesterday's CBGs were stable   7.  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 On Decadron  2 mg daily. Monitor for neurologic symptoms. --per Radiation Onc noted 12/01-->plans were to keep patient on decadron  2 mg BID and possibly increase over radiation Tx depending on symptoms.  - 12/13 patient and spouse would like to know when chemo can be resumed, consider checking with oncologist  next week Vitamin C  supplement d/ced   8.  CKD 3a: Baseline SCr @1 .3- 1.5 range for past couple of years             --acute on chronic renal failure improved with IVF and d/c of Jardiance .  9.  Leucocytosis: WBC 14.9 at admission and has been has been in 13-14 range             --likely due to decadron  for cerebral edema.    10. OSA: Compliant with CPAP use at home but refusing in hospital.  --wife to bring his machine at home which will help with sleep.    11. Acute blood loss anemia: Stable overall 9-10 range. Repeat Hgb tomorrow  12. Situational depression w/anxiety: continue lexapro  5mg  daily- added this admission, ativan  added after supper   13. Class 1 obesity: BMI: 33.8 @ admission-->has been losing weight for past 2-3 months.    14. HLD: continue Lipitor  80mg  nightly and Zetia  10mg  daily   15. HTN: continue Benazepril  20mg  daily, Toprol  as above, IV magnesium  ordered 12/15, magnesium  level reviewed and is normal 12/16, magnesium  level ordered 12/18 and was normal at 2, magnesium  oxide 200mg  daily ordered    LOS: 6 days A FACE TO FACE EVALUATION WAS PERFORMED  Anneli Bing P Kaoru Benda 03/21/2024, 11:40 AM

## 2024-03-21 NOTE — Progress Notes (Signed)
 Physical Therapy Session Note  Patient Details  Name: Douglas Hunter MRN: [id number] Date of Birth: 18-Sep-1955  Today's Date: 03/21/2024 PT Individual Time: 1130-1200, 1330-1445 PT Individual Time Calculation (min): 30 min, 75 min   Short Term Goals: Week 1:  PT Short Term Goal 1 (Week 1): STG=LTG due to ELOS  Skilled Therapeutic Interventions/Progress Updates:    Session 1: Pt seated in w/c on arrival and agreeable to therapy. No complaint of pain. Pt propelled w/c with BUE during session, but fatigues quickly. Pt performed w/c push ups 4 x 10 for UE strength and safety with RW. Pt then performed sit up twists in w/c with 3 kg ball for core strength and stability, 3 x 10. Pt returned to room and remained in w/c, was left with all needs in reach and alarm active.    Session 2: pt received in bed and agreeable to therapy. No complaint of pain. Bed mobility with supervision. CGA for squat pivot EOB>w/c<>mat table. Pt cued for set up and w/c parts management.   Pt then performed amputee exercises 3 x 10 for each, instructed on technique throughout: SLR RLE only sidelying abduction Sidelying hip flexion/extension Prone hip extension   Extensive cueing and demo required for sidelying exercises. Discussed prone stretching for hip ROM.  Pt propelled w/c with cues for efficient propulsion, but poor carryover. Pt required encouragement to continue once fatigued, but was able to continue successfully.  Pt performed 3 x 10 mini squats for RLE strength and endurance, cues for full hip extension to maintain glute strength. Pt then performed RLE w/c propulsion for hamstring strength. Pt returned to room and to bed with squat pivot, was left with all needs in reach and alarm active.    Therapy Documentation Precautions:  Precautions Precautions: Fall Recall of Precautions/Restrictions: Intact Restrictions Weight Bearing Restrictions Per Provider Order: Yes LLE Weight Bearing Per Provider  Order: Non weight bearing Other Position/Activity Restrictions: AKA General:     Therapy/Group: Individual Therapy  [name] 03/21/2024, 1:46 PM

## 2024-03-21 NOTE — Progress Notes (Signed)
 Occupational Therapy Session Note  Patient Details  Name: Douglas Hunter MRN: [id number] Date of Birth: 29-Jun-1955  Today's Date: 03/21/2024 OT Individual Time: 1000-1040 OT Individual Time Calculation (min): 40 min    Short Term Goals: Week 1:  OT Short Term Goal 1 (Week 1): patient will complete LB dressing with mod A OT Short Term Goal 2 (Week 1): patient will complete toileting with LRAD with mod A OT Short Term Goal 3 (Week 1): patient will complete transfers with overall CGA  Skilled Therapeutic Interventions/Progress Updates:    Pt resting in bed upon arrival with wife present. Pt requesting to have a shower this morning. Skilled OT intervention with focus on functional transfers, sit<>stand, standing balance, bathing at shower level, dressing from EOB, and safety awareness to increase independence with bADLs. All squat pivot transfers with CGA. Pt requires min verbal cues for safety. Min A for bathing in shower when standing to wash buttocks. Dressing from EOB. Pt able to pull underpants over hips when standing with CGA but required min A for pulling pants over hips. Pt required assistance to don LLE shrinker. Pt educated on purpose of shrinker. Pt required assistance to don shoe but able to don sock at bed level without assistance. Pt transferred back to w/c. Wife present and all needs within reach.   Therapy Documentation Precautions:  Precautions Precautions: Fall Recall of Precautions/Restrictions: Intact Restrictions Weight Bearing Restrictions Per Provider Order: Yes LLE Weight Bearing Per Provider Order: Non weight bearing Other Position/Activity Restrictions: AKA   Pain:  Pt denies pain this morning   Therapy/Group: Individual Therapy  Maritza Debby Mare 03/21/2024, 10:55 AM

## 2024-03-21 NOTE — Plan of Care (Signed)
°  Problem: Consults Goal: RH LIMB LOSS PATIENT EDUCATION Description: Description: See Patient Education module for eduction specifics. Outcome: Progressing Goal: Skin Care Protocol Initiated - if Braden Score 18 or less Description: If consults are not indicated, leave blank or document N/A Outcome: Progressing Goal: Nutrition Consult-if indicated Outcome: Progressing Goal: Diabetes Guidelines if Diabetic/Glucose > 140 Description: If diabetic or lab glucose is > 140 mg/dl - Initiate Diabetes/Hyperglycemia Guidelines & Document Interventions  Outcome: Progressing   Problem: RH SKIN INTEGRITY Goal: RH STG SKIN FREE OF INFECTION/BREAKDOWN Description: Manage w min assist Outcome: Progressing   Problem: RH SAFETY Goal: RH STG ADHERE TO SAFETY PRECAUTIONS W/ASSISTANCE/DEVICE Description: STG Adhere to Safety Precautions With cues Assistance/Device. Outcome: Progressing   Problem: RH PAIN MANAGEMENT Goal: RH STG PAIN MANAGED AT OR BELOW PT'S PAIN GOAL Description: Pain < 4 with prns Outcome: Progressing   Problem: RH KNOWLEDGE DEFICIT LIMB LOSS Goal: RH STG INCREASE KNOWLEDGE OF SELF CARE AFTER LIMB LOSS Description: Patient and wife will be able to manage care at discharge using educational resources for medications, dietary modification and skin care independently Outcome: Progressing   Problem: Education: Goal: Ability to describe self-care measures that may prevent or decrease complications (Diabetes Survival Skills Education) will improve Outcome: Progressing Goal: Individualized Educational Video(s) Outcome: Progressing   Problem: Coping: Goal: Ability to adjust to condition or change in health will improve Outcome: Progressing   Problem: Fluid Volume: Goal: Ability to maintain a balanced intake and output will improve Outcome: Progressing   Problem: Health Behavior/Discharge Planning: Goal: Ability to identify and utilize available resources and services will  improve Outcome: Progressing Goal: Ability to manage health-related needs will improve Outcome: Progressing   Problem: Metabolic: Goal: Ability to maintain appropriate glucose levels will improve Outcome: Progressing   Problem: Nutritional: Goal: Maintenance of adequate nutrition will improve Outcome: Progressing Goal: Progress toward achieving an optimal weight will improve Outcome: Progressing   Problem: Skin Integrity: Goal: Risk for impaired skin integrity will decrease Outcome: Progressing   Problem: Tissue Perfusion: Goal: Adequacy of tissue perfusion will improve Outcome: Progressing

## 2024-03-21 NOTE — Progress Notes (Signed)
 Speech Language Pathology Daily Session Note  Patient Details  Name: Douglas Hunter MRN: [id number] Date of Birth: 07/07/1955  Today's Date: 03/21/2024 SLP Individual Time: 0900-1000 SLP Individual Time Calculation (min): 60 min  Short Term Goals: Week 1: SLP Short Term Goal 1 (Week 1): STGs = LTGs d/t ELOS  Skilled Therapeutic Interventions:   Pt and his wife greeted at bedside for tx targeting communication and cognition. He appeared in improved spirits as compared to 12/15. Anticipate notification of d/c date assisted w/ this, as he was reportedly excited about the idea of being home before Christmas. SLP facilitated functional problem solving/organization task relevant to pt routine. He required only supervision during verbal sequencing task re household routines and ADLs. After, he required modA for problem solving and information processing during verbal task adding an item to a (concrete) category. Only minA required for naming. He continues to demonstrate improved success w/ very functional tasks vs structured. At the end of tx tasks, he was left in his bed w/ the alarm set and call light within reach. Recommend cont ST per POC.    Pain  No pain reported  Therapy/Group: Individual Therapy  Douglas Hunter 03/21/2024, 9:40 AM

## 2024-03-22 LAB — CBC WITH DIFFERENTIAL/PLATELET
Abs Immature Granulocytes: 0.16 K/uL — ABNORMAL HIGH (ref 0.00–0.07)
Basophils Absolute: 0.1 K/uL (ref 0.0–0.1)
Basophils Relative: 0 %
Eosinophils Absolute: 0.1 K/uL (ref 0.0–0.5)
Eosinophils Relative: 1 %
HCT: 34.5 % — ABNORMAL LOW (ref 39.0–52.0)
Hemoglobin: 11.5 g/dL — ABNORMAL LOW (ref 13.0–17.0)
Immature Granulocytes: 1 %
Lymphocytes Relative: 28 %
Lymphs Abs: 3.9 K/uL (ref 0.7–4.0)
MCH: 30.9 pg (ref 26.0–34.0)
MCHC: 33.3 g/dL (ref 30.0–36.0)
MCV: 92.7 fL (ref 80.0–100.0)
Monocytes Absolute: 1.4 K/uL — ABNORMAL HIGH (ref 0.1–1.0)
Monocytes Relative: 10 %
Neutro Abs: 8.3 K/uL — ABNORMAL HIGH (ref 1.7–7.7)
Neutrophils Relative %: 60 %
Platelets: 415 K/uL — ABNORMAL HIGH (ref 150–400)
RBC: 3.72 MIL/uL — ABNORMAL LOW (ref 4.22–5.81)
RDW: 14.5 % (ref 11.5–15.5)
WBC: 13.9 K/uL — ABNORMAL HIGH (ref 4.0–10.5)
nRBC: 0 % (ref 0.0–0.2)

## 2024-03-22 LAB — GLUCOSE, CAPILLARY
Glucose-Capillary: 206 mg/dL — ABNORMAL HIGH (ref 70–99)
Glucose-Capillary: 162 mg/dL — ABNORMAL HIGH (ref 70–99)

## 2024-03-22 MED ORDER — MAGNESIUM OXIDE -MG SUPPLEMENT 400 (240 MG) MG PO TABS
400.0000 mg | ORAL_TABLET | Freq: Every day | ORAL | Status: DC
  Administered 2024-03-23 – 2024-03-26 (×4): 400 mg via ORAL
  Filled 2024-03-22 (×4): qty 1

## 2024-03-22 NOTE — Plan of Care (Signed)
" °  Problem: RH Comprehension Communication Goal: LTG Patient will comprehend basic/complex auditory (SLP) Description: LTG: Patient will comprehend basic/complex auditory information with cues (SLP). Flowsheets (Taken 03/22/2024 0747) LTG: Patient will comprehend:  Basic auditory information  Complex auditory information LTG: Patient will comprehend auditory information with cueing (SLP): Minimal Assistance - Patient > 75%   Problem: RH Expression Communication Goal: LTG Patient will verbally express basic/complex needs(SLP) Description: LTG:  Patient will verbally express basic/complex needs, wants or ideas with cues  (SLP) Flowsheets (Taken 03/22/2024 0747) LTG: Patient will verbally express basic/complex needs, wants or ideas (SLP): Minimal Assistance - Patient > 75%   Problem: RH Problem Solving Goal: LTG Patient will demonstrate problem solving for (SLP) Description: LTG:  Patient will demonstrate problem solving for basic/complex daily situations with cues  (SLP) 03/22/2024 0827 by Berna Recardo LABOR, CCC-SLP Flowsheets Taken 03/22/2024 0827 LTG: Patient will demonstrate problem solving for (SLP): Basic daily situations Taken 03/22/2024 0747 LTG Patient will demonstrate problem solving for: Minimal Assistance - Patient > 75%   Problem: RH Attention Goal: LTG Patient will demonstrate this level of attention during functional activites (SLP) Description: LTG:  Patient will will demonstrate this level of attention during functional activites (SLP) Flowsheets Taken 03/22/2024 0747 Patient will demonstrate this level of attention during cognitive/linguistic activities in: Controlled LTG: Patient will demonstrate this level of attention during cognitive/linguistic activities with assistance of (SLP): Supervision Number of minutes patient will demonstrate attention during cognitive/linguistic activities: 15 Taken 03/17/2024 1755 Patient will demonstrate during cognitive/linguistic  activities the attention type of: Sustained   "

## 2024-03-22 NOTE — Progress Notes (Signed)
 Physical Therapy Session Note  Patient Details  Name: Douglas Hunter MRN: [id number] Date of Birth: 1955/08/31  Today's Date: 03/22/2024 PT Individual Time: 9081-9054 and 1300-1413 PT Individual Time Calculation (min): 27 min and 73 min  Short Term Goals: Week 1:  PT Short Term Goal 1 (Week 1): STG=LTG due to ELOS  Skilled Therapeutic Interventions/Progress Updates:   Treatment Session 1 Received pt semi-reclined in bed with son at bedside. Pt agreeable to PT treatment and denied any pain (only phantom sensation). Session with emphasis on functional mobility/transfers, generalized strengthening and endurance, and dynamic standing balance/coordination. Assisted pt with pulling shrinker up higher and dressing placed on medial thigh to prevent further skin breakdown. Pt performed bed mobility with HOB elevated and use of bedrails with supervision and donned R shoe with max A. Stood from EOB with RW and CGA fading to very close supervision (cues for hand placement) and performed the following exercises with emphasis on LE strength, ROM, and balance: -L SLR 2x10 -L hip abduction 2x10 -L hip extension 2x10 -R single leg squats 2x10 -alternating UE raises 2x10 bilaterally (pt relying on posterior leg support for balance) Attempted R heel raises but pt unable to lift heel. Concluded session with pt sitting EOB with all needs within reach and son at bedside.   Treatment Session 2 Received pt semi-reclined in bed with son at bedside. Pt agreeable to PT treatment and denied any pain during session, just phantom limb sensation. Session with emphasis on functional mobility/transfers, generalized strengthening and endurance, simulated car transfers, dynamic standing balance/coordination, and ambulation. Adjusted shrinker and transitioned to sitting R EOB with supervision. Donned R shoe with max A and pt performed all squat<>pivot transfers with close supervision throughout session (min cues for management  of WC armrest). Cleared pt's son/wife to assist with transfers in room (safety plan updated and RN aware).   Pt performed WC mobility 153ft using BUE and supervision with emphasis on UE strength/coordination - limited by fatigue. Transported remainder of way to ortho gym for energy conservation purposes and pt performed simulated car transfer via stand<>pivot x 2 trials without AD and CGA fading to close supervision. Discussed having pt's wife bring in car tomorrow to practice. Pt then performed WC mobility 67ft on uneven surfaces x 2 trials (trial 1 ascending and descending forwards with BUE support and min A to ascend, trial 2 ascending backwards and descending forwards using BUE and RLE without assist). Pt reported going up backwards was much easier.   Transferred on/off Nustep with CGA via squat<>pivot and performed BUE and RLE strengthening on workload 4 on Pace Partner for 8 minutes with emphasis on cardiovascular endurance with 1 rest break. Total of 747 steps 0.4 miles, 74 SPM, and 2.7 METs. Stood in // bars with min A and ambulated 52ft x 2 trials forwards and 19ft x 2 trials backwards with CGA with emphasis on slow, controlled landing on R foot. Pt with increased difficulty with this and would benefit from continued practice. Pt then performed x10 hops on RLE and CGA again with emphasis on soft controlled landing. Returned to room and transferred back to bed with supervision (pt demo improvements with independence donning/doffing legrest). Concluded session with pt semi-reclined in bed, needs within reach, and bed alarm on.   Therapy Documentation Precautions:  Precautions Precautions: Fall Recall of Precautions/Restrictions: Intact Restrictions Weight Bearing Restrictions Per Provider Order: Yes LLE Weight Bearing Per Provider Order: Non weight bearing Other Position/Activity Restrictions: AKA  Therapy/Group: Individual Therapy Douglas Hunter  Douglas Hunter Stains PT, DPT 03/22/2024, 6:50  AM

## 2024-03-22 NOTE — Progress Notes (Signed)
 Speech Language Pathology Daily Session Note  Patient Details  Name: Douglas Hunter MRN: [id number] Date of Birth: 09-03-1955  Today's Date: 03/22/2024 SLP Individual Time: 9264-9166 SLP Individual Time Calculation (min): 58 min  Short Term Goals: Week 1: SLP Short Term Goal 1 (Week 1): STGs = LTGs d/t ELOS  Skilled Therapeutic Interventions:   Pt greeted at bedside for tx targeting cognition and communication. He was up in his WC upon SLP arrival, and reported he was ready for tx tasks. SLP facilitated design match task targeting sustained attention. He was able to independently sustain attention for ~12 mins during task and completed it @ modI. He then completed a written decoding task w/ supervisionA for information processing and attention to detail. He then completed very functional time management task w/ minA for processing/comprehension and functional problem solving. LTGs edited given baseline deficits and less responsibilities w/ IADLs as previously reported. At the end of tx tasks, he was left in his [facility] w/ the call light within reach. Recommend cont ST per updated POC.   Pain  None reported   Therapy/Group: Individual Therapy  [name] 03/22/2024, 7:49 AMh

## 2024-03-22 NOTE — Plan of Care (Signed)
" °  Problem: Consults Goal: RH LIMB LOSS PATIENT EDUCATION Description: Description: See Patient Education module for eduction specifics. Outcome: Progressing Goal: Skin Care Protocol Initiated - if Braden Score 18 or less Description: If consults are not indicated, leave blank or document N/A Outcome: Progressing Goal: Nutrition Consult-if indicated Outcome: Progressing Goal: Diabetes Guidelines if Diabetic/Glucose > 140 Description: If diabetic or lab glucose is > 140 mg/dl - Initiate Diabetes/Hyperglycemia Guidelines & Document Interventions  Outcome: Progressing   Problem: RH SKIN INTEGRITY Goal: RH STG SKIN FREE OF INFECTION/BREAKDOWN Description: Manage w min assist Outcome: Progressing   Problem: RH SAFETY Goal: RH STG ADHERE TO SAFETY PRECAUTIONS W/ASSISTANCE/DEVICE Description: STG Adhere to Safety Precautions With cues Assistance/Device. Outcome: Progressing   Problem: RH PAIN MANAGEMENT Goal: RH STG PAIN MANAGED AT OR BELOW PT'S PAIN GOAL Description: Pain < 4 with prns Outcome: Progressing   Problem: RH KNOWLEDGE DEFICIT LIMB LOSS Goal: RH STG INCREASE KNOWLEDGE OF SELF CARE AFTER LIMB LOSS Description: Patient and wife will be able to manage care at discharge using educational resources for medications, dietary modification and skin care independently Outcome: Progressing   Problem: Education: Goal: Ability to describe self-care measures that may prevent or decrease complications (Diabetes Survival Skills Education) will improve Outcome: Progressing Goal: Individualized Educational Video(s) Outcome: Progressing   Problem: Coping: Goal: Ability to adjust to condition or change in health will improve Outcome: Progressing   Problem: Fluid Volume: Goal: Ability to maintain a balanced intake and output will improve Outcome: Progressing   Problem: Health Behavior/Discharge Planning: Goal: Ability to identify and utilize available resources and services will  improve Outcome: Progressing Goal: Ability to manage health-related needs will improve Outcome: Progressing   Problem: Metabolic: Goal: Ability to maintain appropriate glucose levels will improve Outcome: Progressing   Problem: Nutritional: Goal: Maintenance of adequate nutrition will improve Outcome: Progressing Goal: Progress toward achieving an optimal weight will improve Outcome: Progressing   Problem: Skin Integrity: Goal: Risk for impaired skin integrity will decrease Outcome: Progressing   Problem: Tissue Perfusion: Goal: Adequacy of tissue perfusion will improve Outcome: Progressing

## 2024-03-22 NOTE — Progress Notes (Signed)
 Occupational Therapy Session Note  Patient Details  Name: Douglas Hunter MRN: [id number] Date of Birth: [birth year]  Today's Date: 03/22/2024 OT Individual Time: 1009-1050 OT Individual Time Calculation (min): 41 min    Short Term Goals: Week 1:  OT Short Term Goal 1 (Week 1): patient will complete LB dressing with mod A OT Short Term Goal 2 (Week 1): patient will complete toileting with LRAD with mod A OT Short Term Goal 3 (Week 1): patient will complete transfers with overall CGA  Skilled Therapeutic Interventions/Progress Updates:  Skilled OT intervention completed with focus on functional transfers, toileting management and DC planning. Pt received seated EOB, agreeable to session. No pain reported.  Declined ADL needs. Completed CGA squat pivot > w/c. Self-propelled in w/c > gym with supervision. Cues for efficiency with steering. Able to set up w/c next to mat with supervision with cues for arm rest removal and close supervision from w/c <> EOM. Discussed plans for toileting at home, with current plan reported to stand on my only leg and lean on RW that will be against the cabinet. OT advised against this method for fall prevention. Suggested completing lateral leans for clothing and hygiene management as family already purchased bariatric DABSC. Discussed with pt the increased independence with this method, however anticipate pt needs reinforcement and son present to confirm this.   Seated EOM, pt completed the following activities to simulate toileting including reaching/wiping with pericare and LB clothing management: -use BUE to retrieve colored discs scattered underneath buttocks/thighs; min difficulty noted. Cues needed for R<>L and anterior leaning however only within frame of wide BSC width not leaning onto elbow  Discussed with pt and son about having pt trial seated toileting in room for DC prep. Transported dependently in w/c > room. Pt remained seated in w/c, with chair  alarm on/activated, and with all needs in reach at end of session.   Therapy Documentation Precautions:  Precautions Precautions: Fall Recall of Precautions/Restrictions: Intact Restrictions Weight Bearing Restrictions Per Provider Order: Yes LLE Weight Bearing Per Provider Order: Non weight bearing Other Position/Activity Restrictions: AKA    Therapy/Group: Individual Therapy  Lorrayne FORBES Fritter, MS, OTR/L  03/22/2024, 12:16 PM

## 2024-03-22 NOTE — Plan of Care (Deleted)
" °  Problem: RH Comprehension Communication Goal: LTG Patient will comprehend basic/complex auditory (SLP) Description: LTG: Patient will comprehend basic/complex auditory information with cues (SLP). Flowsheets (Taken 03/22/2024 0747) LTG: Patient will comprehend:  Basic auditory information  Complex auditory information LTG: Patient will comprehend auditory information with cueing (SLP): Minimal Assistance - Patient > 75%   Problem: RH Expression Communication Goal: LTG Patient will verbally express basic/complex needs(SLP) Description: LTG:  Patient will verbally express basic/complex needs, wants or ideas with cues  (SLP) Flowsheets (Taken 03/22/2024 0747) LTG: Patient will verbally express basic/complex needs, wants or ideas (SLP): Minimal Assistance - Patient > 75%   Problem: RH Problem Solving Goal: LTG Patient will demonstrate problem solving for (SLP) Description: LTG:  Patient will demonstrate problem solving for basic/complex daily situations with cues  (SLP) Flowsheets (Taken 03/22/2024 0747) LTG: Patient will demonstrate problem solving for (SLP):  Basic daily situations  Complex daily situations LTG Patient will demonstrate problem solving for: Minimal Assistance - Patient > 75% Note: Mildly complex   Problem: RH Attention Goal: LTG Patient will demonstrate this level of attention during functional activites (SLP) Description: LTG:  Patient will will demonstrate this level of attention during functional activites (SLP) Flowsheets Taken 03/22/2024 0747 Patient will demonstrate this level of attention during cognitive/linguistic activities in: Controlled LTG: Patient will demonstrate this level of attention during cognitive/linguistic activities with assistance of (SLP): Supervision Number of minutes patient will demonstrate attention during cognitive/linguistic activities: 15 Taken 03/17/2024 1755 Patient will demonstrate during cognitive/linguistic activities the  attention type of: Sustained   "

## 2024-03-22 NOTE — Progress Notes (Signed)
 Patient ID: Douglas Hunter, male   DOB: 10/21/1955, 68 y.o.   MRN: [id number]  Discharge date updated to 12/23.

## 2024-03-22 NOTE — Progress Notes (Signed)
 "                                                        PROGRESS NOTE   Subjective/Complaints: No new complaints this morning WBC downtrending,  Patient's chart reviewed- No issues reported overnight Vitals signs stable   ROS: denies pain, LBM 12/16, +situational depression, +anxiety   Objective:   No results found. Recent Labs    03/21/24 0524 03/22/24 0620  WBC 14.1* 13.9*  HGB 11.6* 11.5*  HCT 35.1* 34.5*  PLT 446* 415*   Recent Labs    03/21/24 0524  NA 134*  K 4.3  CL 101  CO2 25  GLUCOSE 87  BUN 29*  CREATININE 1.10  CALCIUM  9.0    Intake/Output Summary (Last 24 hours) at 03/22/2024 1205 Last data filed at 03/22/2024 0738 Gross per 24 hour  Intake 600 ml  Output 2100 ml  Net -1500 ml        Physical Exam: Vital Signs Blood pressure (!) 145/78, pulse 63, temperature 98 F (36.7 C), temperature source Oral, resp. rate 18, height 5' 11 (1.803 m), weight 102.8 kg, SpO2 99%.   Gen: no distress, normal appearing HEENT: oral mucosa pink and moist, NCAT Cardio: Reg rate Chest: normal effort, normal rate of breathing Abd: soft, non-distended Ext: no edema Psych: pleasant, normal affect Skin: LLE AKA well healed Neuro: Aox4, no cognitive deficits, sensation intact, strength 5/5 throughout Musculoskeletal: LLE AKA with shrinker in place, stable 12/19  Assessment/Plan: 1. Functional deficits which require 3+ hours per day of interdisciplinary therapy in a comprehensive inpatient rehab setting. Physiatrist is providing close team supervision and 24 hour management of active medical problems listed below. Physiatrist and rehab team continue to assess barriers to discharge/monitor patient progress toward functional and medical goals  Care Tool:  Bathing    Body parts bathed by patient: Right arm, Left arm, Chest, Abdomen, Front perineal area, Buttocks, Right upper leg, Left upper leg, Right lower leg   Body parts bathed by helper: Left lower  leg Body parts n/a: Left lower leg   Bathing assist Assist Level: Minimal Assistance - Patient > 75%     Upper Body Dressing/Undressing Upper body dressing   What is the patient wearing?: Pull over shirt    Upper body assist Assist Level: Supervision/Verbal cueing    Lower Body Dressing/Undressing Lower body dressing      What is the patient wearing?: Ace wrap/stump shrinker, Pants, Underwear/pull up     Lower body assist Assist for lower body dressing: Minimal Assistance - Patient > 75%     Toileting Toileting    Toileting assist Assist for toileting: Total Assistance - Patient < 25%     Transfers Chair/bed transfer  Transfers assist     Chair/bed transfer assist level: Contact Guard/Touching assist     Locomotion Ambulation   Ambulation assist      Assist level: Minimal Assistance - Patient > 75% Assistive device: Walker-rolling Max distance: 5 ft   Walk 10 feet activity   Assist  Walk 10 feet activity did not occur: Safety/medical concerns (L AKA/weakness/fatigue)        Walk 50 feet activity   Assist Walk 50 feet with 2 turns activity did not occur: Safety/medical concerns (L AKA/weakness/fatigue)         Walk  150 feet activity   Assist Walk 150 feet activity did not occur: Safety/medical concerns (L AKA/weakness/fatigue)         Walk 10 feet on uneven surface  activity   Assist Walk 10 feet on uneven surfaces activity did not occur: Safety/medical concerns (L AKA/weakness/fatigue)         Wheelchair     Assist Is the patient using a wheelchair?: Yes Type of Wheelchair: Manual    Wheelchair assist level: Supervision/Verbal cueing Max wheelchair distance: 110 ft    Wheelchair 50 feet with 2 turns activity    Assist        Assist Level: Supervision/Verbal cueing   Wheelchair 150 feet activity     Assist  Wheelchair 150 feet activity did not occur:  (based on max distance of 110 feet per PT  documentation)   Assist Level: Supervision/Verbal cueing   Blood pressure (!) 145/78, pulse 63, temperature 98 F (36.7 C), temperature source Oral, resp. rate 18, height 5' 11 (1.803 m), weight 102.8 kg, SpO2 99%.  Medical Problem List and Plan: 1. 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              This patient is capable of making decisions on his own behalf  2. Phantom limb sensation --tylenol  prn mild pain, decrease perocet to 1 tab q4H prn  3. Insomnia: continue Ativan . Very anxious about lack of sleep             -antipsychotic agents: N/A             -Melatonin 5mg  nightly   4. H/o A fib/flutter/NSVT: Monitor HR TID and for symptoms with activity             --on Tikosyn  500mcg BID, Toprol  25mg  daily and Xarelto   -supplement potassium and magnesium  to goal of 4 and 2 respectively   5.  L-AKA for limb ischemia: Monitor for healing. Staple removal appt Jan 6th.   6.  T2DM: w/retinopathy OD: HGb A1c- 8.0. PTA on Jardiance , Lantus  25U bid and humalog  16U TID --currently on Insulin  glargine 45 units BID (adjusted to home regimen of b'fast and supper) with novolog  10 units TID --change SSI to moderate as anticipate BS to improve with intensiver rehab program.  -d/c feeding supplement -increase lantus  to 46U BID -discussed that yesterday's CBGs were stable   7.  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 On Decadron  2 mg daily. Monitor for neurologic symptoms. --per Radiation Onc noted 12/01-->plans were to keep patient on decadron  2 mg BID and possibly increase over radiation Tx depending on symptoms.  - 12/13 patient and spouse would like to know when chemo can be resumed, consider  checking with oncologist next week Vitamin C  supplement d/ced   8.  CKD 3a: Baseline SCr @1 .3- 1.5 range for past couple of years             --acute on chronic renal failure improved with IVF and d/c of Jardiance .  9.  Leucocytosis: WBC 14.9 at admission and has been has been in 13-14 range             --likely due to decadron  for cerebral edema.    10. OSA: Compliant with CPAP use at home but refusing in hospital.  --wife to bring his machine at home which will help with sleep.    11. Acute blood loss anemia: reviewed and is stable  12. Situational depression w/anxiety: continue lexapro  5mg  daily- added this admission, ativan  added after supper   13. Class 1 obesity: BMI: 33.8 @ admission-->has been losing weight for past 2-3 months.    14. HLD: continue Lipitor  80mg  nightly and Zetia  10mg  daily   15. HTN: continue Benazepril  20mg  daily, Toprol  as above, IV magnesium  ordered 12/15, magnesium  level reviewed and is normal 12/16, magnesium  level ordered 12/18 and was normal at 2, increase magnesium  oxide to 400mg  daily    LOS: 7 days A FACE TO FACE EVALUATION WAS PERFORMED  Sven P Juliany Daughety 03/22/2024, 12:05 PM     "

## 2024-03-23 DIAGNOSIS — R739 Hyperglycemia, unspecified: Secondary | ICD-10-CM

## 2024-03-23 DIAGNOSIS — I10 Essential (primary) hypertension: Secondary | ICD-10-CM

## 2024-03-23 LAB — GLUCOSE, CAPILLARY: Glucose-Capillary: 233 mg/dL — ABNORMAL HIGH (ref 70–99)

## 2024-03-23 NOTE — Plan of Care (Signed)
" °  Problem: Consults Goal: RH LIMB LOSS PATIENT EDUCATION Description: Description: See Patient Education module for eduction specifics. Outcome: Progressing Goal: Skin Care Protocol Initiated - if Braden Score 18 or less Description: If consults are not indicated, leave blank or document N/A Outcome: Progressing Goal: Nutrition Consult-if indicated Outcome: Progressing Goal: Diabetes Guidelines if Diabetic/Glucose > 140 Description: If diabetic or lab glucose is > 140 mg/dl - Initiate Diabetes/Hyperglycemia Guidelines & Document Interventions  Outcome: Progressing   Problem: RH SKIN INTEGRITY Goal: RH STG SKIN FREE OF INFECTION/BREAKDOWN Description: Manage w min assist Outcome: Progressing   Problem: RH SAFETY Goal: RH STG ADHERE TO SAFETY PRECAUTIONS W/ASSISTANCE/DEVICE Description: STG Adhere to Safety Precautions With cues Assistance/Device. Outcome: Progressing   Problem: RH PAIN MANAGEMENT Goal: RH STG PAIN MANAGED AT OR BELOW PT'S PAIN GOAL Description: Pain < 4 with prns Outcome: Progressing   Problem: RH KNOWLEDGE DEFICIT LIMB LOSS Goal: RH STG INCREASE KNOWLEDGE OF SELF CARE AFTER LIMB LOSS Description: Patient and wife will be able to manage care at discharge using educational resources for medications, dietary modification and skin care independently Outcome: Progressing   Problem: Education: Goal: Ability to describe self-care measures that may prevent or decrease complications (Diabetes Survival Skills Education) will improve Outcome: Progressing Goal: Individualized Educational Video(s) Outcome: Progressing   Problem: Coping: Goal: Ability to adjust to condition or change in health will improve Outcome: Progressing   Problem: Fluid Volume: Goal: Ability to maintain a balanced intake and output will improve Outcome: Progressing   Problem: Health Behavior/Discharge Planning: Goal: Ability to identify and utilize available resources and services will  improve Outcome: Progressing Goal: Ability to manage health-related needs will improve Outcome: Progressing   Problem: Metabolic: Goal: Ability to maintain appropriate glucose levels will improve Outcome: Progressing   Problem: Nutritional: Goal: Maintenance of adequate nutrition will improve Outcome: Progressing Goal: Progress toward achieving an optimal weight will improve Outcome: Progressing   Problem: Skin Integrity: Goal: Risk for impaired skin integrity will decrease Outcome: Progressing   Problem: Tissue Perfusion: Goal: Adequacy of tissue perfusion will improve Outcome: Progressing

## 2024-03-23 NOTE — Progress Notes (Signed)
 Physical Therapy Discharge Summary  Patient Details  Name: Douglas Hunter MRN: [id number] Date of Birth: 08/31/1955  Date of Discharge from PT service:March 25, 2024  Patient has met 7 of 9 long term goals due to improved activity tolerance, improved balance, improved postural control, increased strength, increased range of motion, ability to compensate for deficits, improved awareness, and improved coordination.  Patient to discharge at a wheelchair level Modified Independent. Patient's care partner is independent to provide the necessary physical assistance at discharge. Pt's wife and son attended multiple family education sessions throughout pt's stay.   Reasons goals not met: Pt did not meet ambulation goal of 36ft with supervision or dynamic standing goal of supervision due to altered balance strategies and global weakness/deconditioning.   Recommendation:  Patient will benefit from ongoing skilled PT services in outpatient setting to continue to advance safe functional mobility, address ongoing impairments in transfers, generalized strengthening and endurance, dynamic standing balance/coordination, limb loss education, and to minimize fall risk.  Equipment: No equipment provided  Reasons for discharge: treatment goals met and discharge from hospital  Patient/family agrees with progress made and goals achieved: Yes  PT Discharge Precautions/Restrictions Precautions Precautions: Fall Restrictions Weight Bearing Restrictions Per Provider Order: Yes LLE Weight Bearing Per Provider Order: Non weight bearing Pain Interference Pain Interference Pain Effect on Sleep: 1. Rarely or not at all Pain Interference with Therapy Activities: 1. Rarely or not at all Pain Interference with Day-to-Day Activities: 1. Rarely or not at all Cognition Overall Cognitive Status: Within Functional Limits for tasks assessed Arousal/Alertness: Awake/alert Orientation Level: Oriented X4 Awareness:  Appears intact Problem Solving: Appears intact Safety/Judgment: Appears intact Sensation Sensation Light Touch: Impaired by gross assessment Proprioception: Appears Intact Additional Comments: LLE Coordination Gross Motor Movements are Fluid and Coordinated: No Fine Motor Movements are Fluid and Coordinated: No Finger Nose Finger Test: [facility] Motor  Motor Motor: Within Functional Limits  Mobility Bed Mobility Bed Mobility: Rolling Right;Rolling Left;Sit to Supine;Supine to Sit Rolling Right: Independent with assistive device Rolling Left: Independent with assistive device Supine to Sit: Independent with assistive device Sit to Supine: Independent with assistive device Transfers Transfers: Sit to Stand;Stand to Sit;Stand Pivot Transfers;Squat Pivot Transfers Sit to Stand: Supervision/Verbal cueing Stand to Sit: Supervision/Verbal cueing Squat Pivot Transfers: Independent with assistive device Lateral/Scoot Transfers: Independent with assistive device Transfer (Assistive device): Rolling walker Locomotion  Gait Ambulation: Yes Gait Assistance: Contact Guard/Touching assist Gait Distance (Feet): 10 Feet Assistive device: Parallel bars Gait Assistance Details: Verbal cues for gait pattern;Verbal cues for sequencing;Verbal cues for technique;Verbal cues for precautions/safety Gait Assistance Details: verbal cues for soft landing on RLE Gait Gait: Yes Gait Pattern: Impaired (hop to) Gait velocity: decreased Stairs / Additional Locomotion Stairs: No Pick up small object from the floor assist level: Dependent - Patient 0% Wheelchair Mobility Wheelchair Mobility: Yes Wheelchair Assistance: Independent with Scientist, Research (life Sciences): Both upper extremities Wheelchair Parts Management: Independent Distance: 19ft  Trunk/Postural Assessment  Cervical Assessment Cervical Assessment: Within Functional Limits Thoracic Assessment Thoracic Assessment: Within  Functional Limits Lumbar Assessment Lumbar Assessment: Within Functional Limits Postural Control Postural Control: Deficits on evaluation Righting Reactions: delayed on L Protective Responses: delayed on L  Balance Balance Balance Assessed: Yes Static Sitting Balance Static Sitting - Balance Support: Feet supported;Bilateral upper extremity supported Static Sitting - Level of Assistance: 7: Independent Dynamic Sitting Balance Dynamic Sitting - Balance Support: Feet supported;No upper extremity supported Dynamic Sitting - Level of Assistance: 6: Modified independent (Device/Increase time) Static Standing Balance Static  Standing - Balance Support: Bilateral upper extremity supported;During functional activity (RW) Static Standing - Level of Assistance: 5: Stand by assistance (close supervision) Dynamic Standing Balance Dynamic Standing - Balance Support: Bilateral upper extremity supported;During functional activity (RW) Dynamic Standing - Level of Assistance: 5: Stand by assistance (CGA) Dynamic Standing - Comments: with transfers Extremity Assessment  RLE Assessment RLE Assessment: Within Functional Limits General Strength Comments: grossly 5/5 LLE Assessment LLE Assessment: Exceptions to [facility] General Strength Comments: hip flex 4+/5   Therisa HERO Zaunegger Therisa Stains PT, DPT 03/23/2024, 12:20 PM

## 2024-03-23 NOTE — Progress Notes (Signed)
 Occupational Therapy Weekly Progress Note  Patient Details  Name: Douglas Hunter MRN: [id number] Date of Birth: 11/02/1955  Beginning of progress report period: March 16, 2024 End of progress report period: March 23, 2024   Patient has met 3 of 3 short term goals.  Pt is making steady progress towards LTGs. He is able to bathe with min A, UB dress with set up A, LB dress with Min A and completes toileting with Min A. In the past week, pt has been mostly limited dynamic standing balance, GMC and decreased recall. In addition, pt continues to demonstrate mild cognitive and dynamic standing balance deficits resulting in difficulty completing BADL tasks without increased physical assist. Pt will benefit from continued skilled OT services to focus on mentioned deficits.   Patient continues to demonstrate the following deficits: muscle weakness, decreased cardiorespiratoy endurance, decreased coordination, decreased memory, and decreased standing balance and therefore will continue to benefit from skilled OT intervention to enhance overall performance with BADL and Reduce care partner burden.  Patient progressing toward long term goals..  Continue plan of care.  OT Short Term Goals Week 1:  OT Short Term Goal 1 (Week 1): patient will complete LB dressing with mod A OT Short Term Goal 1 - Progress (Week 1): Met OT Short Term Goal 2 (Week 1): patient will complete toileting with LRAD with mod A OT Short Term Goal 2 - Progress (Week 1): Met OT Short Term Goal 3 (Week 1): patient will complete transfers with overall CGA OT Short Term Goal 3 - Progress (Week 1): Met Week 2:  OT Short Term Goal 1 (Week 2): STG = LTG due to ELOS    Johara Lodwick E Danea Manter, MS, OTR/L  03/23/2024, 12:17 PM

## 2024-03-23 NOTE — Progress Notes (Signed)
 "                                                        PROGRESS NOTE   Subjective/Complaints:  Pt doing well, slept ok per pt once he got some meds last night. Per nursing, pt/family more concerned with sleep meds, but wife at bedside with me clarified orders but didn't express any other concerns.  Denies pain today. LBM yesterday. Urinating fine. No other complaints or concerns.   ROS: denies pain, LBM 12/16, +situational depression, +anxiety Denies CP, SOB, abd pain, n/v/d/c   Objective:   No results found. Recent Labs    03/21/24 0524 03/22/24 0620  WBC 14.1* 13.9*  HGB 11.6* 11.5*  HCT 35.1* 34.5*  PLT 446* 415*   Recent Labs    03/21/24 0524  NA 134*  K 4.3  CL 101  CO2 25  GLUCOSE 87  BUN 29*  CREATININE 1.10  CALCIUM  9.0    Intake/Output Summary (Last 24 hours) at 03/23/2024 1213 Last data filed at 03/23/2024 1151 Gross per 24 hour  Intake 360 ml  Output 950 ml  Net -590 ml        Physical Exam: Vital Signs Blood pressure (!) 144/77, pulse 64, temperature 97.9 F (36.6 C), resp. rate 18, height 5' 11 (1.803 m), weight 104.4 kg, SpO2 99%.   Gen: no distress, normal appearing, sitting on commode HEENT: oral mucosa pink and moist, NCAT Cardio: Reg rate and rhythm, no m/r/g appreciated Chest: normal effort, normal rate of breathing, CTAB Abd: soft, non-distended, nontender, +BS throughout.  Ext: no edema, LLE AKA covered Psych: pleasant, normal affect  PRIOR EXAMS: Skin: LLE AKA well healed Neuro: Aox4, no cognitive deficits, sensation intact, strength 5/5 throughout Musculoskeletal: LLE AKA with shrinker in place, stable 12/19  Assessment/Plan: 1. Functional deficits which require 3+ hours per day of interdisciplinary therapy in a comprehensive inpatient rehab setting. Physiatrist is providing close team supervision and 24 hour management of active medical problems listed below. Physiatrist and rehab team continue to assess barriers to  discharge/monitor patient progress toward functional and medical goals  Care Tool:  Bathing    Body parts bathed by patient: Right arm, Left arm, Chest, Abdomen, Front perineal area, Buttocks, Right upper leg, Left upper leg, Right lower leg   Body parts bathed by helper: Left lower leg Body parts n/a: Left lower leg   Bathing assist Assist Level: Minimal Assistance - Patient > 75%     Upper Body Dressing/Undressing Upper body dressing   What is the patient wearing?: Pull over shirt    Upper body assist Assist Level: Supervision/Verbal cueing    Lower Body Dressing/Undressing Lower body dressing      What is the patient wearing?: Ace wrap/stump shrinker, Pants, Underwear/pull up     Lower body assist Assist for lower body dressing: Minimal Assistance - Patient > 75%     Toileting Toileting    Toileting assist Assist for toileting: Total Assistance - Patient < 25%     Transfers Chair/bed transfer  Transfers assist     Chair/bed transfer assist level: Contact Guard/Touching assist     Locomotion Ambulation   Ambulation assist      Assist level: Minimal Assistance - Patient > 75% Assistive device: Walker-rolling Max distance: 5 ft   Walk 10  feet activity   Assist  Walk 10 feet activity did not occur: Safety/medical concerns (L AKA/weakness/fatigue)        Walk 50 feet activity   Assist Walk 50 feet with 2 turns activity did not occur: Safety/medical concerns (L AKA/weakness/fatigue)         Walk 150 feet activity   Assist Walk 150 feet activity did not occur: Safety/medical concerns (L AKA/weakness/fatigue)         Walk 10 feet on uneven surface  activity   Assist Walk 10 feet on uneven surfaces activity did not occur: Safety/medical concerns (L AKA/weakness/fatigue)         Wheelchair     Assist Is the patient using a wheelchair?: Yes Type of Wheelchair: Manual    Wheelchair assist level: Supervision/Verbal  cueing Max wheelchair distance: 110 ft    Wheelchair 50 feet with 2 turns activity    Assist        Assist Level: Supervision/Verbal cueing   Wheelchair 150 feet activity     Assist  Wheelchair 150 feet activity did not occur:  (based on max distance of 110 feet per PT documentation)   Assist Level: Supervision/Verbal cueing   Blood pressure (!) 144/77, pulse 64, temperature 97.9 F (36.6 C), resp. rate 18, height 5' 11 (1.803 m), weight 104.4 kg, SpO2 99%.  Medical Problem List and Plan: 1. 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              This patient is capable of making decisions on his own behalf  2. Phantom limb sensation --tylenol  prn mild pain, decrease perocet to 1 tab q4H prn  3. Insomnia: continue Ativan . Very anxious about lack of sleep             -antipsychotic agents: N/A             -Melatonin 5mg  nightly -03/23/24 on ativan  scheduled 0.5mg  at supper time, and 0.5mg  PRN, expressed he was able to sleep with night meds so will leave meds alone   4. H/o A fib/flutter/NSVT: Monitor HR TID and for symptoms with activity             --on Tikosyn  500mcg BID, Toprol  25mg  daily and Xarelto   -supplement potassium and magnesium  to goal of 4 and 2 respectively   5.  L-AKA for limb ischemia: Monitor for healing. Staple removal appt Jan 6th.   6.  T2DM: w/retinopathy OD: HGb A1c- 8.0. PTA on Jardiance , Lantus  25U bid and humalog  16U TID --currently on Insulin  glargine 45 units BID (adjusted to home regimen of b'fast and supper) with novolog  10 units TID --change SSI to moderate as anticipate BS to improve with intensiver rehab program.  -d/c feeding supplement -increase lantus  to 46U  BID -discussed that yesterday's CBGs were stable -03/23/24 some concern that his home meter isn't accurate per nursing, CBGs somewhat erratic but improving since Lantus  increase 12/16. Monitor.    CBG (last 3)-- from fingersticks, NOT from meter Recent Labs  03/21/24 1631 03/22/24 1258 03/22/24 2047  GLUCAP 311* 206* 162*    7.  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 On Decadron  2 mg daily. Monitor for neurologic symptoms. --per Radiation Onc noted 12/01-->plans were to keep patient on decadron  2 mg BID and possibly increase over radiation Tx depending on symptoms.  - 12/13 patient and spouse would like to know when chemo can be resumed, consider checking with oncologist next week Vitamin C  supplement d/ced   8.  CKD 3a: Baseline SCr @1 .3- 1.5 range for past couple of years             --acute on chronic renal failure improved with IVF and d/c of Jardiance .    9.  Leucocytosis: WBC 14.9 at admission and has been has been in 13-14 range             --likely due to decadron  for cerebral edema.    10. OSA: Compliant with CPAP use at home but refusing in hospital.  --wife to bring his machine at home which will help with sleep.    11. Acute blood loss anemia: reviewed and is stable  12. Situational depression w/anxiety: continue lexapro  5mg  daily- added this admission, ativan  added after supper   13. Class 1 obesity: BMI: 33.8 @ admission-->has been losing weight for past 2-3 months.    14. HLD: continue Lipitor  80mg  nightly and Zetia  10mg  daily   15. HTN: continue Benazepril  20mg  daily, Toprol  as above, IV magnesium  ordered 12/15, magnesium  level reviewed and is normal 12/16, magnesium  level ordered 12/18 and was normal at 2, increase magnesium  oxide to 400mg  daily  -03/23/24 BPs stable, monitor   Vitals:   03/19/24 1934 03/20/24 0505 03/20/24 1421 03/20/24 1928  BP: 121/74 (!) 144/78 123/66 (!) 149/75   03/21/24 0521 03/21/24 1629 03/21/24 2002  03/22/24 0559  BP: (!) 156/81 (!) 132/59 134/60 (!) 145/78   03/22/24 1515 03/22/24 1517 03/22/24 2012 03/23/24 0547  BP: 134/67 134/67 125/70 (!) 144/77     LOS: 8 days A FACE TO FACE EVALUATION WAS PERFORMED  [address] 03/23/2024, 12:13 PM     "

## 2024-03-23 NOTE — Progress Notes (Signed)
 Occupational Therapy Session Note  Patient Details  Name: Douglas Hunter MRN: [id number] Date of Birth: January 21, 1956  Today's Date: 03/23/2024 OT Individual Time: 0920-1015 OT Individual Time Calculation (min): 55 min    Short Term Goals: Week 1:  OT Short Term Goal 1 (Week 1): patient will complete LB dressing with mod A OT Short Term Goal 2 (Week 1): patient will complete toileting with LRAD with mod A OT Short Term Goal 3 (Week 1): patient will complete transfers with overall CGA  Skilled Therapeutic Interventions/Progress Updates:  Skilled OT session completed to address ADL retraining. Pt received seated EOB with wife present, agreeable to participate in therapy. Pt reports no pain.  Pt requesting to perform shower level ADLs. EOB>WC squat pivot CGA. Pt self-propelled to bathroom with Min A d/t incline entering bathroom. WC>B-DABSC squat pivot Min A for safety. Toileting hygiene with supervision, lateral weight shifting for clothing mgmt. B-DABSC>WC squat pivot Min A. Pt self-propelled to TTB, WC>TTB CGA. Pt doffed LB clothing with Min A to remove RLE from shorts. Pt completed shower level ADLs with CGA, pt standing intermittently to wash bottom, OT provided VC for safety to lateral weight shift for peri care to reduce risk for falls. TTB>WC squat pivot CGA. Pt completed LB dressing with Min A, UB dressing with set-up A, and shrinker mgmt with Mod A on LAKA. No skin integrity issues noted when donning/doffing shrinker. Pt self-propelled to sink to perform person grooming with set-up to open razor. Session concluded with pt in [facility] with all needs in reach.   Therapy Documentation Precautions:  Precautions Precautions: Fall Recall of Precautions/Restrictions: Intact Restrictions Weight Bearing Restrictions Per Provider Order: Yes LLE Weight Bearing Per Provider Order: Non weight bearing Other Position/Activity Restrictions: AKA    Therapy/Group: Individual Therapy  Joeangel Jeanpaul  Woods-Chance, MS, OTR/L 03/23/2024, 7:49 AM

## 2024-03-23 NOTE — Progress Notes (Addendum)
 Pt assigned tech informs clinical research associate that pt Libre glucose monitor begin alerting and upon checking pt informed her that his glucose was 86 at which time he begin drinking a Mello yellow. Writer enters room and notes pt to be sitting up in bed alert and oriented drinking a bottle of Mello Yellow. Son who is staying the night is at bsd.  Pt states  My son drinks Mello Yellow when his sugar is low so he gave me one. Oceanographer pt and son on the importance of informing staff when the [place] alerts him of any changes to his blood sugar to allow staff the opportunity to assess and follow MD ordered protocol. Pt and son verbalize understanding. Pt FSBS rechecked via libre and noted to be 168. Charge nurse made aware.

## 2024-03-24 NOTE — Progress Notes (Signed)
 CBG per patient's device=172.Douglas Hunter

## 2024-03-24 NOTE — Progress Notes (Signed)
 Occupational Therapy Session Note  Patient Details  Name: Douglas Hunter MRN: [id number] Date of Birth: [birth year]  Today's Date: 03/24/2024 OT Individual Time: 1001-1043 OT Individual Time Calculation (min): 42 min    Short Term Goals: Week 1:  OT Short Term Goal 1 (Week 1): patient will complete LB dressing with mod A OT Short Term Goal 1 - Progress (Week 1): Met OT Short Term Goal 2 (Week 1): patient will complete toileting with LRAD with mod A OT Short Term Goal 2 - Progress (Week 1): Met OT Short Term Goal 3 (Week 1): patient will complete transfers with overall CGA OT Short Term Goal 3 - Progress (Week 1): Met Week 2:  OT Short Term Goal 1 (Week 2): STG = LTG due to ELOS   Skilled Therapeutic Interventions/Progress Updates:    Pt up in wheelchair at time of session, spouse present and remained throughout session. No pain. Focused on ADL retraining for toilet transfers, LB dressing, and problem solving home set up. Pt performing toilet transfers with CGA with grab bar to/from toilet, standing with unilateral support while using other UE to don/doff clothing. Pt needing assist with shrinker, MOD for sock and shoe. Encouraged pt to attempt tasks instead of having wife automatically assist. Problem solve through use of BSC over toilet, bathroom set up, etc. At end of session pt up in wheelchair call bell in reach all needs met.   Therapy Documentation Precautions:  Precautions Precautions: Fall Recall of Precautions/Restrictions: Intact Restrictions Weight Bearing Restrictions Per Provider Order: Yes LLE Weight Bearing Per Provider Order: Non weight bearing Other Position/Activity Restrictions: AKA    Therapy/Group: Individual Therapy  Chiquita JAYSON Hopping 03/24/2024, 10:33 AM

## 2024-03-24 NOTE — Progress Notes (Addendum)
 "                                                        PROGRESS NOTE   Subjective/Complaints:  Pt doing well again, slept ok. Denies pain today. LBM this morning. Urinating fine. No other complaints or concerns.   ROS: denies pain, LBM 12/16, +situational depression, +anxiety Denies CP, SOB, abd pain, n/v/d/c   Objective:   No results found. Recent Labs    03/22/24 0620  WBC 13.9*  HGB 11.5*  HCT 34.5*  PLT 415*   No results for input(s): NA, K, CL, CO2, GLUCOSE, BUN, CREATININE, CALCIUM  in the last 72 hours.   Intake/Output Summary (Last 24 hours) at 03/24/2024 1217 Last data filed at 03/24/2024 9187 Gross per 24 hour  Intake 1680 ml  Output 1950 ml  Net -270 ml        Physical Exam: Vital Signs Blood pressure (!) 152/82, pulse 70, temperature 97.6 F (36.4 C), temperature source Oral, resp. rate 16, height 5' 11 (1.803 m), weight 104.6 kg, SpO2 97%.   Gen: no distress, normal appearing, sitting in w/c getting dressed HEENT: oral mucosa pink and moist, NCAT Cardio: Reg rate and rhythm, no m/r/g appreciated Chest: normal effort, normal rate of breathing, CTAB Abd: soft, non-distended, nontender, +BS throughout.  Ext: trace to 1+ RLE edema, LLE AKA covered Psych: pleasant, normal affect  PRIOR EXAMS: Skin: LLE AKA well healed Neuro: Aox4, no cognitive deficits, sensation intact, strength 5/5 throughout Musculoskeletal: LLE AKA with shrinker in place, stable 12/19  Assessment/Plan: 1. Functional deficits which require 3+ hours per day of interdisciplinary therapy in a comprehensive inpatient rehab setting. Physiatrist is providing close team supervision and 24 hour management of active medical problems listed below. Physiatrist and rehab team continue to assess barriers to discharge/monitor patient progress toward functional and medical goals  Care Tool:  Bathing    Body parts bathed by patient: Right arm, Left arm, Chest, Abdomen, Front  perineal area, Buttocks, Right upper leg, Left upper leg, Right lower leg   Body parts bathed by helper: Left lower leg Body parts n/a: Left lower leg   Bathing assist Assist Level: Minimal Assistance - Patient > 75%     Upper Body Dressing/Undressing Upper body dressing   What is the patient wearing?: Pull over shirt    Upper body assist Assist Level: Supervision/Verbal cueing    Lower Body Dressing/Undressing Lower body dressing      What is the patient wearing?: Ace wrap/stump shrinker, Pants, Underwear/pull up     Lower body assist Assist for lower body dressing: Minimal Assistance - Patient > 75%     Toileting Toileting    Toileting assist Assist for toileting: Total Assistance - Patient < 25%     Transfers Chair/bed transfer  Transfers assist     Chair/bed transfer assist level: Independent with assistive device     Locomotion Ambulation   Ambulation assist      Assist level: Contact Guard/Touching assist Assistive device: Parallel bars Max distance: 4ft   Walk 10 feet activity   Assist  Walk 10 feet activity did not occur: Safety/medical concerns (L AKA/weakness/fatigue)  Assist level: Contact Guard/Touching assist Assistive device: Parallel bars   Walk 50 feet activity   Assist Walk 50 feet with 2 turns activity did not  occur: Safety/medical concerns (L AKA/weakness/fatigue)         Walk 150 feet activity   Assist Walk 150 feet activity did not occur: Safety/medical concerns (L AKA/weakness/fatigue)         Walk 10 feet on uneven surface  activity   Assist Walk 10 feet on uneven surfaces activity did not occur: Safety/medical concerns (L AKA/weakness/fatigue)         Wheelchair     Assist Is the patient using a wheelchair?: Yes Type of Wheelchair: Manual    Wheelchair assist level: Independent Max wheelchair distance: 127ft    Wheelchair 50 feet with 2 turns activity    Assist        Assist Level:  Independent   Wheelchair 150 feet activity     Assist  Wheelchair 150 feet activity did not occur:  (based on max distance of 110 feet per PT documentation)   Assist Level: Independent   Blood pressure (!) 152/82, pulse 70, temperature 97.6 F (36.4 C), temperature source Oral, resp. rate 16, height 5' 11 (1.803 m), weight 104.6 kg, SpO2 97%.  Medical Problem List and Plan: 1. 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              This patient is capable of making decisions on his own behalf  2. Phantom limb sensation --tylenol  prn mild pain, decrease perocet to 1 tab q4H prn  3. Insomnia: continue Ativan . Very anxious about lack of sleep             -antipsychotic agents: N/A             -Melatonin 5mg  nightly -03/23/24 on ativan  scheduled 0.5mg  at supper time, and 0.5mg  PRN, expressed he was able to sleep with night meds so will leave meds alone   4. H/o A fib/flutter/NSVT: Monitor HR TID and for symptoms with activity             --on Tikosyn  500mcg BID, Toprol  25mg  daily and Xarelto   -supplement potassium and magnesium  to goal of 4 and 2 respectively   5.  L-AKA for limb ischemia: Monitor for healing. Staple removal appt Jan 6th.   6.  T2DM: w/retinopathy OD: HGb A1c- 8.0. PTA on Jardiance , Lantus  25U bid and humalog  16U TID --currently on Insulin  glargine 45 units BID (adjusted to home regimen of b'fast and supper) with novolog  10 units TID --change SSI to moderate as anticipate BS to improve with intensiver rehab program.  -d/c feeding supplement -increase lantus  to 46U BID -discussed that yesterday's CBGs were stable -03/23/24 some concern that his home meter isn't accurate per nursing, CBGs somewhat  erratic but improving since Lantus  increase 12/16. Monitor.  -03/24/24 CBGs still very erratic but maybe better overall; cont to monitor, but would maybe see if we can confirm accuracy of his home meter   CBG (last 3)-- from fingersticks, NOT from meter Recent Labs    03/22/24 1258 03/22/24 2047 03/23/24 2024  GLUCAP 206* 162* 233*    7.  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 On Decadron  2 mg daily. Monitor for neurologic symptoms. --per Radiation Onc noted 12/01-->plans were to keep patient on decadron  2 mg BID and possibly increase over radiation Tx depending on symptoms.  - 12/13 patient and spouse would like to know when chemo can be resumed, consider checking with oncologist next week Vitamin C  supplement d/ced   8.  CKD 3a: Baseline SCr @1 .3- 1.5 range for past couple of years             --acute on chronic renal failure improved with IVF and d/c of Jardiance .    9.  Leucocytosis: WBC 14.9 at admission and has been has been in 13-14 range             --likely due to decadron  for cerebral edema.    10. OSA: Compliant with CPAP use at home but refusing in hospital.  --wife to bring his machine at home which will help with sleep.    11. Acute blood loss anemia: reviewed and is stable  12. Situational depression w/anxiety: continue lexapro  5mg  daily- added this admission, ativan  added after supper   13. Class 1 obesity: BMI: 33.8 @ admission-->has been losing weight for past 2-3 months.    14. HLD: continue Lipitor  80mg  nightly and Zetia  10mg  daily   15. HTN: continue Benazepril  20mg  daily, Toprol  as above, IV magnesium  ordered 12/15, magnesium  level reviewed and is normal 12/16, magnesium  level ordered 12/18 and was normal at 2, increase magnesium  oxide to 400mg  daily  -12/20-21/25 BPs stable, monitor   Vitals:   03/21/24 0521 03/21/24 1629 03/21/24 2002 03/22/24 0559  BP: (!) 156/81 (!) 132/59 134/60 (!) 145/78   03/22/24 1515 03/22/24 1517  03/22/24 2012 03/23/24 0547  BP: 134/67 134/67 125/70 (!) 144/77   03/23/24 1534 03/23/24 1947 03/24/24 0319 03/24/24 0347  BP: 137/70 134/77 96/79 (!) 152/82     LOS: 9 days A FACE TO FACE EVALUATION WAS PERFORMED  [address] 03/24/2024, 12:17 PM     "

## 2024-03-24 NOTE — Progress Notes (Signed)
 Physical Therapy Session Note  Patient Details  Name: Douglas Hunter MRN: [id number] Date of Birth: 10/19/1955  Today's Date: 03/24/2024 PT Individual Time: 0800-0900 PT Individual Time Calculation (min): 60 min   Short Term Goals: Week 1:  PT Short Term Goal 1 (Week 1): STG=LTG due to ELOS  Skilled Therapeutic Interventions/Progress Updates:    pt received in bed and agreeable to therapy. No complaint of pain.  Bed mobility and squat pivots with supervision throughout session, pt able to set up for transfer and manage w/c parts without cueing.   Pt participated in standing therex with RW and CGA overall. Cues for RW safety, especially reaching back to sit. Pt performed 3 x 10 3-way hip with extensive cueing for upright posture. Then performed 4 x 5 Sit to stand with CGA. Pt able to reach back with cue before beginning. Discussed importance of doing standing exercise if he plans to use a prosthetic, pt resistant but willing to participate.   Pt used nustep on double hills profile, load interval 6-10. Pt cued to perform either x 10 minutes or 2 x 5 minutes, but stated I got to stop and required several short rest breaks throughout and 1 longer rest break in the middle.   Pt returned to room and remained in w/c, was left with needs in reach.  Therapy Documentation Precautions:  Precautions Precautions: Fall Recall of Precautions/Restrictions: Intact Restrictions Weight Bearing Restrictions Per Provider Order: Yes LLE Weight Bearing Per Provider Order: Non weight bearing Other Position/Activity Restrictions: AKA General:     Therapy/Group: Individual Therapy  [name] 03/24/2024, 8:21 AM

## 2024-03-24 NOTE — Progress Notes (Signed)
 Occupational Therapy Session Note  Patient Details  Name: Douglas Hunter MRN: [id number] Date of Birth: 1955/08/06  Today's Date: 03/24/2024 OT Individual Time: 0100-0145 OT Individual Time Calculation (min): 45 min    Short Term Goals: Week 1:  OT Short Term Goal 1 (Week 1): patient will complete LB dressing with mod A OT Short Term Goal 1 - Progress (Week 1): Met OT Short Term Goal 2 (Week 1): patient will complete toileting with LRAD with mod A OT Short Term Goal 2 - Progress (Week 1): Met OT Short Term Goal 3 (Week 1): patient will complete transfers with overall CGA OT Short Term Goal 3 - Progress (Week 1): Met  Skilled Therapeutic Interventions/Progress Updates:   Initial Encounter:  Patient resting in bed at the time of arrival with family present. Patient indicated that he sleep fairly well with no pain to report at the time of treatment. Patient in agreement with working on UB strength for gain in functional task performance.    UB Exercise: The pt went on to complete UB exercises using a 2lb dowel 2 sets of 13 for shld flexion, horizotonal abduction, shld rotation, and lg circles with rest breaks as needed, pt required 2 rest breaks. The pt went on to complete bicep curls and elbow extension using a 3lb dumb bell,  2 sets of 13 with rest breaks as needed, the  pt required 1 rest break. The pt was able to maintain upright positioning against opposing external force  directed from various planes for a count of 10. At the end of the session, the pt was able to transfer back to bed LOF from EOB at [facility]. Prior to me exiting the room, the call light and bedside table were placed within reach with all additional needs addressed.   Therapy Documentation Precautions:  Precautions Precautions: Fall Recall of Precautions/Restrictions: Intact Restrictions Weight Bearing Restrictions Per Provider Order: Yes LLE Weight Bearing Per Provider Order: Non weight bearing Other  Position/Activity Restrictions: AKA  Therapy/Group: Individual Therapy  Elvera JONETTA Mace 03/24/2024, 4:45 PM

## 2024-03-24 NOTE — Progress Notes (Addendum)
 Speech Language Pathology Daily Session Note  Patient Details  Name: Douglas Hunter MRN: [id number] Date of Birth: 07-26-1955  Today's Date: 03/24/2024 SLP Individual Time: 1400-1445 SLP Individual Time Calculation (min): 45 min  Short Term Goals: Week 1: SLP Short Term Goal 1 (Week 1): STGs = LTGs d/t ELOS  Skilled Therapeutic Interventions:  Pt seen for ST targeting communication and cognition goals. Denied pain. Wife present for session. SLP facilitated word finding tasks to improve verbal fluency and use of circumlocution. Pt generatively named words given a semantic or phonemic category when provided mod A this session.Occasional semantic and phonemic paraphasias noted and pt was unaware of his errors. He described images for his wife to guess to practice circumlocution strategy when provided min A. Pt left in bed with his call bell in reach, alarm active, and wife present. Continue ST POC.    Pain Pain Assessment Pain Scale: 0-10 Pain Score: 0-No pain  Therapy/Group: Individual Therapy  Waddell JONETTA Novak, MA CCC-SLP 03/24/2024, 3:54 PM

## 2024-03-24 NOTE — Progress Notes (Signed)
 Patient reports blood glucose was low this morning with a reading of 62 on home libra. Patient had self treated with snack at bedside and reports blood sugar up to 85 at time of notification.

## 2024-03-25 ENCOUNTER — Encounter: Payer: Self-pay | Admitting: Internal Medicine

## 2024-03-25 ENCOUNTER — Other Ambulatory Visit (HOSPITAL_COMMUNITY): Payer: Self-pay

## 2024-03-25 ENCOUNTER — Ambulatory Visit: Admitting: Speech Pathology

## 2024-03-25 DIAGNOSIS — G4700 Insomnia, unspecified: Secondary | ICD-10-CM | POA: Insufficient documentation

## 2024-03-25 LAB — CBC
HCT: 34.9 % — ABNORMAL LOW (ref 39.0–52.0)
Hemoglobin: 11.6 g/dL — ABNORMAL LOW (ref 13.0–17.0)
MCH: 31.1 pg (ref 26.0–34.0)
MCHC: 33.2 g/dL (ref 30.0–36.0)
MCV: 93.6 fL (ref 80.0–100.0)
Platelets: 333 K/uL (ref 150–400)
RBC: 3.73 MIL/uL — ABNORMAL LOW (ref 4.22–5.81)
RDW: 14.6 % (ref 11.5–15.5)
WBC: 15.9 K/uL — ABNORMAL HIGH (ref 4.0–10.5)
nRBC: 0 % (ref 0.0–0.2)

## 2024-03-25 LAB — MAGNESIUM: Magnesium: 1.8 mg/dL (ref 1.7–2.4)

## 2024-03-25 LAB — GLUCOSE, CAPILLARY
Glucose-Capillary: 144 mg/dL — ABNORMAL HIGH (ref 70–99)
Glucose-Capillary: 109 mg/dL — ABNORMAL HIGH (ref 70–99)
Glucose-Capillary: 132 mg/dL — ABNORMAL HIGH (ref 70–99)
Glucose-Capillary: 179 mg/dL — ABNORMAL HIGH (ref 70–99)
Glucose-Capillary: 217 mg/dL — ABNORMAL HIGH (ref 70–99)

## 2024-03-25 LAB — BASIC METABOLIC PANEL WITH GFR
Anion gap: 10 (ref 5–15)
BUN: 23 mg/dL (ref 8–23)
CO2: 22 mmol/L (ref 22–32)
Calcium: 8.5 mg/dL — ABNORMAL LOW (ref 8.9–10.3)
Chloride: 99 mmol/L (ref 98–111)
Creatinine, Ser: 1.04 mg/dL (ref 0.61–1.24)
GFR, Estimated: >60 mL/min
Glucose, Bld: 165 mg/dL — ABNORMAL HIGH (ref 70–99)
Potassium: 4.4 mmol/L (ref 3.5–5.1)
Sodium: 132 mmol/L — ABNORMAL LOW (ref 135–145)

## 2024-03-25 MED ORDER — INSULIN LISPRO 100 UNIT/ML (KWIKPEN): 10.0000 [IU] | PEN_INJECTOR | Freq: Three times a day (TID) | SUBCUTANEOUS | Status: AC

## 2024-03-25 MED ORDER — BENAZEPRIL HCL 20 MG PO TABS: 20.0000 mg | ORAL_TABLET | Freq: Every day | ORAL | Status: AC

## 2024-03-25 MED ORDER — INSULIN GLARGINE 100 UNIT/ML ~~LOC~~ SOLN
45.0000 [IU] | Freq: Two times a day (BID) | SUBCUTANEOUS | Status: DC
  Administered 2024-03-26: 45 [IU] via SUBCUTANEOUS
  Filled 2024-03-25 (×2): qty 0.45

## 2024-03-25 MED ORDER — LORAZEPAM 0.5 MG PO TABS
0.5000 mg | ORAL_TABLET | Freq: Every day | ORAL | 0 refills | Status: DC | PRN
  Filled 2024-03-25: qty 10, 10d supply, fill #0

## 2024-03-25 MED ORDER — INSULIN GLARGINE 100 UNIT/ML ~~LOC~~ SOLN: 46.0000 [IU] | Freq: Two times a day (BID) | SUBCUTANEOUS | Status: AC

## 2024-03-25 MED ORDER — DEXAMETHASONE 2 MG PO TABS
2.0000 mg | ORAL_TABLET | Freq: Every day | ORAL | 0 refills | Status: AC
  Filled 2024-03-25: qty 30, 30d supply, fill #0

## 2024-03-25 MED ORDER — MAGNESIUM OXIDE -MG SUPPLEMENT 400 (240 MG) MG PO TABS
400.0000 mg | ORAL_TABLET | Freq: Every day | ORAL | 0 refills | Status: AC
  Filled 2024-03-25: qty 30, 30d supply, fill #0

## 2024-03-25 MED ORDER — LORAZEPAM 0.5 MG PO TABS
0.5000 mg | ORAL_TABLET | Freq: Every day | ORAL | 0 refills | Status: AC
  Filled 2024-03-25: qty 25, 25d supply, fill #0
  Filled 2024-03-25: qty 15, 15d supply, fill #0

## 2024-03-25 MED ORDER — ESCITALOPRAM OXALATE 5 MG PO TABS
5.0000 mg | ORAL_TABLET | Freq: Every day | ORAL | 0 refills | Status: DC
  Filled 2024-03-25: qty 30, 30d supply, fill #0

## 2024-03-25 MED ORDER — EMPAGLIFLOZIN 10 MG PO TABS
10.0000 mg | ORAL_TABLET | Freq: Every day | ORAL | Status: DC
  Administered 2024-03-25 – 2024-03-26 (×2): 10 mg via ORAL
  Filled 2024-03-25 (×4): qty 1

## 2024-03-25 NOTE — Progress Notes (Signed)
 Occupational Therapy Session Note  Patient Details  Name: Douglas Hunter MRN: [id number] Date of Birth: 1955/04/08  Today's Date: 03/25/2024 OT Individual Time: 1000-1100 OT Individual Time Calculation (min): 60 min    Short Term Goals: Week 2:  OT Short Term Goal 1 (Week 2): STG = LTG due to ELOS  Skilled Therapeutic Interventions/Progress Updates:  Skilled OT intervention completed with focus on DC planning, car transfers and toileting. Pt received EOB, agreeable to session. No pain reported.  Son present requesting pt try real car transfer. Completed mod I squat pivot EOB > w/c. Transported dependently > outside vallet. Pt required min cueing for problem solving of hand placement, but was able to transfer via squat/stand pivot from w/c <> SUV height on passenger side with initial CGA fading to close supervision. Completed x5 for confidence with son present and demonstrated ability to assist pt with task.   Back in room, son requested discussing toileting plans for pt's home environment. Pt required cues for lifting arm rests prior to transfer, as well as to avoid use of grab bar to simulate pt's set up at home. Required CGA initially fading to supervision for w/c <> bari DABSC. Seated on toilet, pt was able to doff/donn shorts without assist seated, but did verbalize inability to complete hygiene. Pt with frustration and observed cognitive deficit of awareness about how it isn't recommended to stand for hygiene because he won't have staff member to wipe him and pt greater risk of falling with this strategy vs lateral leans. Demonstrated use of toileting tongs for self-wiping and pt was able to return demo with cues. Discussed hand placement and fall prevention strategies, with son present and aware for need of reminders/cueing at DC.  Pt remained seated in w/c, with family present and with all needs in reach at end of session.   Therapy Documentation Precautions:   Precautions Precautions: Fall Recall of Precautions/Restrictions: Intact Restrictions Weight Bearing Restrictions Per Provider Order: Yes LLE Weight Bearing Per Provider Order: Non weight bearing Other Position/Activity Restrictions: AKA    Therapy/Group: Individual Therapy  Lorrayne FORBES Fritter, MS, OTR/L  03/25/2024, 12:08 PM

## 2024-03-25 NOTE — Progress Notes (Addendum)
 "                                                        PROGRESS NOTE   Subjective/Complaints:  No events overnight.  No acute complaints. Family working with patient, asks about Onc follow up at discharge.  Hypertensive this a.m. 150s to 160s, on vitals reviewed generally stable in the 130s.  Other vital stable.    03/25/2024    4:56 AM 03/25/2024    4:18 AM 03/24/2024    7:27 PM  Vitals with BMI  Weight 230 lbs 13 oz    BMI 32.21    Systolic  161 145  Diastolic  77 75  Pulse  58 62    Recent Labs    03/22/24 2047 03/23/24 2024 03/25/24 0453  GLUCAP 162* 233* 144*    Blood sugars variable, generally 200s, report of low in the 70s this a.m. but seems there was some discrepancy between the patient's glucometer in the hospital glucometer which reads higher.  Labs with mild hyponatremia 132, improving BUN/creatinine, leukocytosis 15.9, and resolved thrombocytosis. P.o. intakes appropriate   Continent of bladder   Last BM 12/22, medium, continent  ROS: denies pain, +situational depression, +anxiety Denies CP, SOB, abd pain, n/v/d/c   Objective:   No results found. Recent Labs    03/25/24 0504  WBC 15.9*  HGB 11.6*  HCT 34.9*  PLT 333   Recent Labs    03/25/24 0504  NA 132*  K 4.4  CL 99  CO2 22  GLUCOSE 165*  BUN 23  CREATININE 1.04  CALCIUM  8.5*     Intake/Output Summary (Last 24 hours) at 03/25/2024 0809 Last data filed at 03/25/2024 0719 Gross per 24 hour  Intake 1720 ml  Output 2650 ml  Net -930 ml        Physical Exam: Vital Signs Blood pressure (!) 161/77, pulse (!) 58, temperature 97.9 F (36.6 C), temperature source Oral, resp. rate 16, height 5' 11 (1.803 m), weight 104.7 kg, SpO2 99%.   Gen: no distress, normal appearing, sitting in therapy gym.  HEENT: oral mucosa pink and moist, NCAT Cardio: Reg rate and rhythm, no m/r/g appreciated Chest: normal effort, normal rate of breathing, CTAB Abd: soft, non-distended, nontender, +BS  throughout.  Ext: trace to 1+ RLE edema, LLE AKA 1+, stable Psych: pleasant, normal affect  Skin: LLE AKA with staples intact - midline with some dry/flaking skin but otherwise appears to be healing very well Neuro: Aox4, no cognitive deficits, sensation intact, strength 5/5 throughout Musculoskeletal: LLE AKA with shrinker in place    Assessment/Plan: 1. Functional deficits which require 3+ hours per day of interdisciplinary therapy in a comprehensive inpatient rehab setting. Physiatrist is providing close team supervision and 24 hour management of active medical problems listed below. Physiatrist and rehab team continue to assess barriers to discharge/monitor patient progress toward functional and medical goals  Care Tool:  Bathing    Body parts bathed by patient: Right arm, Left arm, Chest, Abdomen, Front perineal area, Buttocks, Right upper leg, Left upper leg, Right lower leg, Face   Body parts bathed by helper: Left lower leg Body parts n/a: Left lower leg   Bathing assist Assist Level: Supervision/Verbal cueing     Upper Body Dressing/Undressing Upper body dressing   What is the  patient wearing?: Pull over shirt    Upper body assist Assist Level: Independent with assistive device    Lower Body Dressing/Undressing Lower body dressing      What is the patient wearing?: Pants, Underwear/pull up     Lower body assist Assist for lower body dressing: Supervision/Verbal cueing     Toileting Toileting    Toileting assist Assist for toileting: Minimal Assistance - Patient > 75%     Transfers Chair/bed transfer  Transfers assist     Chair/bed transfer assist level: Independent with assistive device     Locomotion Ambulation   Ambulation assist      Assist level: Contact Guard/Touching assist Assistive device: Parallel bars Max distance: 98ft   Walk 10 feet activity   Assist  Walk 10 feet activity did not occur: Safety/medical concerns (L  AKA/weakness/fatigue)  Assist level: Contact Guard/Touching assist Assistive device: Parallel bars   Walk 50 feet activity   Assist Walk 50 feet with 2 turns activity did not occur: Safety/medical concerns (L AKA/weakness/fatigue)         Walk 150 feet activity   Assist Walk 150 feet activity did not occur: Safety/medical concerns (L AKA/weakness/fatigue)         Walk 10 feet on uneven surface  activity   Assist Walk 10 feet on uneven surfaces activity did not occur: Safety/medical concerns (L AKA/weakness/fatigue)         Wheelchair     Assist Is the patient using a wheelchair?: Yes Type of Wheelchair: Manual    Wheelchair assist level: Independent Max wheelchair distance: 118ft    Wheelchair 50 feet with 2 turns activity    Assist        Assist Level: Independent   Wheelchair 150 feet activity     Assist  Wheelchair 150 feet activity did not occur:  (based on max distance of 110 feet per PT documentation)   Assist Level: Independent   Blood pressure (!) 161/77, pulse (!) 58, temperature 97.9 F (36.6 C), temperature source Oral, resp. rate 16, height 5' 11 (1.803 m), weight 104.7 kg, SpO2 99%.  Medical Problem List and Plan: 1. 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              This patient is capable of making decisions on his own behalf  2. Phantom limb sensation --tylenol  prn mild pain, decrease perocet to 1 tab q4H prn  - 12/22: Pain well-controlled on current regimen  3. Insomnia: continue Ativan . Very anxious about lack of sleep             -antipsychotic agents: N/A             -Melatonin 5mg  nightly -03/23/24 on ativan  scheduled 0.5mg  at  supper time, and 0.5mg  PRN, expressed he was able to sleep with night meds so will leave meds alone   4. H/o A fib/flutter/NSVT: Monitor HR TID and for symptoms with activity             --  on Tikosyn  500mcg BID, Toprol  25mg  daily and Xarelto   -supplement potassium and magnesium  to goal of 4 and 2 respectively   5.  L-AKA for limb ischemia: Monitor for healing. Staple removal appt Jan 6th.   6.  T2DM: w/retinopathy OD: HGb A1c- 8.0. PTA on Jardiance , Lantus  25U bid and humalog  16U TID --currently on Insulin  glargine 45 units BID (adjusted to home regimen of b'fast and supper) with novolog  10 units TID --change SSI to moderate as anticipate BS to improve with intensiver rehab program.  -d/c feeding supplement -increase lantus  to 46U BID -discussed that yesterday's CBGs were stable -03/23/24 some concern that his home meter isn't accurate per nursing, CBGs somewhat erratic but improving since Lantus  increase 12/16. Monitor.  -03/24/24 CBGs still very erratic but maybe better overall; cont to monitor, but would maybe see if we can confirm accuracy of his home meter  12/22: Blood sugars remain erratic; home meter read 115 while hospital meter read 144 this a.m.  Given improvement in creatinine, greater than 2 weeks postop, no signs of infection, will resume Jardiance  (takes 12.5 mg at home; closest hospital dose 10 mg daily)-reduce Lantus  to 45 U BID for simplicity of dosing-repeat BMP in AM prior to discharge   CBG (last 3)-- from fingersticks, NOT from meter Recent Labs    03/22/24 2047 03/23/24 2024 03/25/24 0453  GLUCAP 162* 233* 144*    7.  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 On Decadron  2 mg daily. Monitor for neurologic symptoms. --per Radiation Onc noted 12/01-->plans were to keep patient on decadron  2 mg BID and possibly increase over radiation Tx depending on symptoms.  - 12/13 patient and spouse would like to know when chemo can be resumed, consider  checking with oncologist next week Vitamin C  supplement d/ced --12/22:  Advised with family to call oncology on discharge for further recs   8.  CKD 3a: Baseline SCr @1 .3- 1.5 range for past couple of years             --acute on chronic renal failure improved with IVF and d/c of Jardiance .   - 12-22: BUN normalized, creatinine well below baseline at 1.04.  P.o. intakes adequate.  Resume Jardiance  as above, monitor in AM.   9.  Leucocytosis: WBC 14.9 at admission and has been has been in 13-14 range             --likely due to decadron  for cerebral edema.   12/22: Slightly up today to 15.9; no external signs of infection, likely variance.  Repeat in AM.   10. OSA: Compliant with CPAP use at home but refusing in hospital.  --wife to bring his machine at home which will help with sleep.    11. Acute blood loss anemia: reviewed and is stable  12. Situational depression w/anxiety: continue lexapro  5mg  daily- added this admission, ativan  added after supper   13. Class 1 obesity: BMI: 33.8 @ admission-->has been losing weight for past 2-3 months.    14. HLD: continue Lipitor  80mg  nightly and Zetia  10mg  daily   15. HTN: continue Benazepril  20mg  daily, Toprol  as above, IV magnesium  ordered 12/15, magnesium  level reviewed and is normal 12/16, magnesium  level ordered 12/18 and was normal at 2, increase magnesium  oxide to 400mg  daily  -12/20-21/25 BPs stable, monitor  12/22: BP slightly elevated this a.m.; monitor for trend as rest have been stable   Vitals:   03/22/24 0559 03/22/24 1515 03/22/24 1517 03/22/24 2012  BP: ROLLEN)  145/78 134/67 134/67 125/70   03/23/24 0547 03/23/24 1534 03/23/24 1947 03/24/24 0319  BP: (!) 144/77 137/70 134/77 96/79   03/24/24 0347 03/24/24 1518 03/24/24 1927 03/25/24 0418  BP: (!) 152/82 125/65 (!) 145/75 (!) 161/77     LOS: 10 days A FACE TO FACE EVALUATION WAS PERFORMED  Douglas Hunter 03/25/2024, 8:09 AM     "

## 2024-03-25 NOTE — Progress Notes (Signed)
 CBG per pt's device 67 at 0450. Patient requested 4oz juice. Used hospital meter for confirmation and CBG was 144 at 0453. Per patient's device, CBG 115 at 0453.

## 2024-03-25 NOTE — Progress Notes (Signed)
 Speech Language Pathology Daily Session Note  Patient Details  Name: Douglas Hunter MRN: [id number] Date of Birth: 01/13/1956  Today's Date: 03/25/2024 SLP Individual Time: 1430-1527 SLP Individual Time Calculation (min): 57 min  Short Term Goals: Week 1: SLP Short Term Goal 1 (Week 1): STGs = LTGs d/t ELOS  Skilled Therapeutic Interventions:  Patient was seen in PM to address cognition and communication. Pt was alert and seated upright in WC upon SLP arrival. He expressed excitement given upcoming discharge. Pt challenging pt in a variety of communication task. He completed a divergent naming task with min A and responsive naming task with mod A. During a guided conversational exchange, pt noted with overt instances of word finding difficulty requiring cues for circumlocution or substitution strategies. Pt was inconsistently aware of semantic errors. In other minutes of session, SLP challenging pt in problem solving after discussing PLOF. Pt aware of safety precautions and need for assist with physical and cognitive tasks. Pt appropriate from discharge at this time however, would benefit from continued skilled intervention in an outpatient setting. SLP to sign off.   Pain Pain Assessment Pain Scale: 0-10 Pain Score: 0-No pain  Therapy/Group: Individual Therapy  [name] 03/25/2024, 4:04 PM

## 2024-03-25 NOTE — Progress Notes (Signed)
 Physical Therapy Session Note  Patient Details  Name: [name] MRN: [id number] Date of Birth: October 23, 1955  Today's Date: 03/25/2024 PT Individual Time: 0805-0915 PT Individual Time Calculation (min): 70 min   Short Term Goals: Week 1:  PT Short Term Goal 1 (Week 1): STG=LTG due to ELOS  Skilled Therapeutic Interventions/Progress Updates:     Pt received seated at EOB with family present and assisting pt to get dressed. PT introduces self and pt agrees to therapy. No complaint of pain. Family attends session. Pt performs squat pivot transfer to [facility] with cues for sequencing and positioning. Pt self propels WC x150' with BUEs to work on mobility and endurance training. Pt completes simulated car transfer with cues for sequencing and safety (requests to defer actual car transfer due to cold temperature outside). Pt takes seated rest break. Pt's son asks about floor transfer and PT encourages pt to attempt, despite pt reporting that he does not plan to be on the ground. PT provides demonstration of floor transfer and education on safety checklist to assess if pt were to suffer a fall. Pt then bumps down from mat table on platforms of gradually decreasing heights. Once seated on ground mat, pt performs bump-ups onto platforms of gradually increasing height, with modA from PT and cues for body mechanics and head hips relationship. Pt performs squat pivot back to WC. Pt left seated in WC with all needs within reach.   Therapy Documentation Precautions:  Precautions Precautions: Fall Recall of Precautions/Restrictions: Intact Restrictions Weight Bearing Restrictions Per Provider Order: Yes LLE Weight Bearing Per Provider Order: Non weight bearing Other Position/Activity Restrictions: AKA   Therapy/Group: Individual Therapy  Elsie JAYSON Dawn, PT, DPT 03/25/2024, 4:17 PM

## 2024-03-25 NOTE — Plan of Care (Signed)
" °  Problem: RH Balance Goal: LTG Patient will maintain dynamic standing with ADLs (OT) Description: LTG:  Patient will maintain dynamic standing balance with assist during activities of daily living (OT)  Outcome: Not Met (add Reason) Flowsheets (Taken 03/25/2024 1538) LTG: Pt will maintain dynamic standing balance during ADLs with: (not met due to decreased balance and GMC) --   Problem: RH Balance Goal: LTG: Patient will maintain dynamic sitting balance (OT) Description: LTG:  Patient will maintain dynamic sitting balance with assistance during activities of daily living (OT) Outcome: Completed/Met   Problem: Sit to Stand Goal: LTG:  Patient will perform sit to stand in prep for activites of daily living with assistance level (OT) Description: LTG:  Patient will perform sit to stand in prep for activites of daily living with assistance level (OT) Outcome: Completed/Met   Problem: RH Bathing Goal: LTG Patient will bathe all body parts with assist levels (OT) Description: LTG: Patient will bathe all body parts with assist levels (OT) Outcome: Completed/Met   Problem: RH Dressing Goal: LTG Patient will perform upper body dressing (OT) Description: LTG Patient will perform upper body dressing with assist, with/without cues (OT). Outcome: Completed/Met Goal: LTG Patient will perform lower body dressing w/assist (OT) Description: LTG: Patient will perform lower body dressing with assist, with/without cues in positioning using equipment (OT) Outcome: Completed/Met   Problem: RH Toileting Goal: LTG Patient will perform toileting task (3/3 steps) with assistance level (OT) Description: LTG: Patient will perform toileting task (3/3 steps) with assistance level (OT)  Outcome: Completed/Met   Problem: RH Toilet Transfers Goal: LTG Patient will perform toilet transfers w/assist (OT) Description: LTG: Patient will perform toilet transfers with assist, with/without cues using equipment  (OT) Outcome: Completed/Met   "

## 2024-03-25 NOTE — Discharge Summary (Signed)
 Physician Discharge Summary  Patient ID: Douglas Hunter MRN: [id number] DOB/AGE: 10-10-1955 68 y.o.  Admit date: 03/15/2024 Discharge date: 03/25/2024  Discharge Diagnoses:  Principal Problem:   Critical limb ischemia of left lower extremity (HCC) Active Problems:   HTN (hypertension)   HLD (hyperlipidemia)   Atrial flutter (HCC)   Gastroesophageal reflux disease without esophagitis   Obesity, unspecified   Rapid atrial fibrillation (HCC)   Type 2 diabetes mellitus with hyperglycemia (HCC)   AKI (acute kidney injury)   OSA on CPAP   Brain tumor (HCC)   Glioblastoma (HCC)   Focal seizures (HCC)   Acute lower limb ischemia   Leukocytosis   Above knee amputation of left lower extremity (HCC)   Anemia   Memory loss due to medical condition   Insomnia   Discharged Condition: stable  Significant Diagnostic Studies: No results found.  Labs:  Basic Metabolic Panel: Recent Labs  Lab 03/19/24 0527 03/20/24 2030 03/21/24 0524 03/25/24 0504  NA 133*  --  134* 132*  K 4.2  --  4.3 4.4  CL 101  --  101 99  CO2 23  --  25 22  GLUCOSE 139*  --  87 165*  BUN 34*  --  29* 23  CREATININE 1.22  --  1.10 1.04  CALCIUM  9.0  --  9.0 8.5*  MG 2.1 1.8 2.0 1.8    CBC: Recent Labs  Lab 03/21/24 0524 03/22/24 0620 03/25/24 0504  WBC 14.1* 13.9* 15.9*  NEUTROABS  --  8.3*  --   HGB 11.6* 11.5* 11.6*  HCT 35.1* 34.5* 34.9*  MCV 93.1 92.7 93.6  PLT 446* 415* 333    CBG: Recent Labs  Lab 03/22/24 2047 03/23/24 2024 03/25/24 0453 03/25/24 0912 03/25/24 1141  GLUCAP 162* 233* 144* 109* 132*    Brief HPI:   Douglas Hunter is a 68 y.o. male  with history of CAD, A flutter, PAF, T2DM, speech difficulties with 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 He was admitted for thrombectomy of left femoral, femorotibial and posterior tibial thrombectomy with 4 compartment  fasciotomy by Dr. Magda and discharged on Xarelto . He continued to have LLE pain affecting WB as well as sleep, dehiscence of fasciotomy wound and new interdigit and limb felt to be unsalvageable . He was admitted on 03/07/24 for L-AKA by Dr. Magda. Post op placed on heparin  drip while H/H was being monitored and stable in 9-10 range so transitioned to Xarelto    He has had issues with acute on chronic renal failure and was treated with IVF for hydration and jardiance  d/c. BS have been labile and insulin  glargline titrated up for tighter BS coverage. Wound VAC removed yesterday and wound reported to be healing well. Pain control improving but noted have adjustment reaction due to limb loss. PT/OT has been working with patient who requires multimodal cues with activity, min assist with SPT as well as cues for AE use and min to mod assist with ADL tasks. He was independent PTA and awaiting start of XRT/Chemo. CIR recommended due to functional decline.     Inpatient Rehabilitation Course: Douglas Hunter was admitted to rehab 03/15/2024 for inpatient therapies to consist of PT, ST and OT at least three hours five days a week. Past admission physiatrist, therapy team and rehab RN have worked together to provide customized collaborative inpatient rehab.  Anticoagulation:  continue Xarelto  20mg  daily   2. Phantom limb sensation: Continue tylenol  prn mild pain, decrease perocet to 1 tab q4H prn.   3. Skin/Wound Care: Wound care/signs and symptoms of infection discussed at discharge.  Staple removal at outpatient appointment on April 09, 2024.   4. GERD: continue PPI for GI ppx.  5. Situational depression w/anxiety: continue lexapro  5mg  daily- added this admission. Insomnia: Patient expresses anxiety about lack of sleep, he will continue Ativan  0.5 mg scheduled at suppertime and 0.5 mg as needed.  He feels that symptoms will improve once he is home.  Discussed risks and benefits of his anxiety medications  and supplements.  Follow-up has been recommended with PCP for further evaluation.     6.  A-fib/flutter: Continue Tikosyn  500 mcg twice daily, Toprol  25 mg daily and Xarelto .  Continue supplement potassium and magnesium  to goal of 4 and 2 respectively. Magnesium  level ordered 12/18 and was normal at 2, increase magnesium  oxide to 400mg  daily.  7.  Hypertension: Continue Benazepril  20mg  daily, Toprol  and magnesium  as above.    8. OSA: Compliant with CPAP   9.  GBM Dx 01/2024: XRT/chemo is currently on hold. Continue Keppra  500 mg bid and Decadron  2 mg daily. Per Radiation Onc noted 12/01-->plans were to keep patient on decadron  2 mg BID and possibly increase over radiation Tx depending on symptoms.  Patient and family advised to call oncology at discharge for further recommendations.   10.  T2DM: Diabetes has been monitored with ac/hs CBG checks and SSI was use prn for tighter BS control. Diabetic teaching was completed.  Blood sugars remained erratic with home meter readings substantially different from inpatient glucometer.  Resume Jardiance  12.5 mg, Lantus  45 units twice daily.  Patient endocrinology/CP follow-up advised.  11. CKD 3a: Baseline SCr @1 .3- 1.5. acute on chronic renal failure improved with IVF and d/c of Jardiance . BUN normalized, creatinine well below baseline at 1.04.  P.o. intakes adequate.  Resume Jardiance  as above,   12.  Leucocytosis: WBC 14.9 at admission and has been has been in 13-14 range.  13. Acute blood loss anemia: reviewed and is stable.  14. HLD: continue Lipitor  80mg  nightly and Zetia  10mg  daily.  15. Class 1 obesity: BMI: 33.8 @ admission-->has been losing weight for past 2-3 months.     Planned Outpatient Follow-Up:   - Vascular surgery for hospital follow-up/staple removal -PCP -PM&R - Radiation oncology   Rehab course: During patient's stay in rehab weekly team conferences were held to monitor patient's progress, set goals and discuss barriers to  discharge. At admission, patient required multimodal cues with activity, min assist with SPT as well as cues for AE use and min to mod assist with ADL tasks.   Occupational Therapy: PPatient has met 7 of 8 long term goals due to improved activity tolerance, improved balance, ability to compensate for deficits, and improved coordination.  Patient to discharge at overall Supervision level.  Patient's care partner is independent to provide the necessary physical and cognitive assistance at discharge.   He/She will benefit from ongoing OT in outpatient setting to continue to advance functional skills in the area of BADL.   Physical Therapy: chip has met 7 of 9 long term goals due to improved activity tolerance, improved balance, improved postural control, increased strength, increased range of motion, ability to compensate for deficits, improved awareness, and improved coordination.  Patient to discharge at a wheelchair level Modified Independent. Patient's care partner is independent to provide the necessary  physical assistance at discharge. Pt's wife and son attended multiple family education sessions throughout pt's stay. Pt did not meet ambulation goal of 24ft with supervision or dynamic standing goal of supervision due to altered balance strategies and global weakness/deconditioning.  He/She will benefit from ongoing skilled PT services in outpatient setting to continue to advance safe functional mobility, address ongoing impairments in strength, ROM, balance, endurance, gait, and minimize fall risk.   Speech Therapy:  Discharging at overall all long term goals. He will benefit from home health/outpatient follow up.    Discharge plan was discussed with patient and/or family member and they verbalized understanding and agreed with it.      Disposition:  Discharge disposition: 01-Home or Self Care       Diet: Diabetic  Special Instructions:  -No driving or operating heavy machinery  until cleared by provider  -No smoking or alcohol or illicit drug use     Allergies as of 03/26/2024   No Known Allergies      Medication List     PAUSE taking these medications    furosemide  20 MG tablet Wait to take this until your doctor or other care provider tells you to start again. Commonly known as: LASIX  TAKE 1 TABLET BY MOUTH TWICE DAILY AS NEEDED What changed: when to take this       STOP taking these medications    Lantus  SoloStar 100 UNIT/ML Solostar Pen Generic drug: insulin  glargine Replaced by: insulin  glargine 100 UNIT/ML injection   ondansetron  8 MG tablet Commonly known as: ZOFRAN    OVER THE COUNTER MEDICATION   oxyCODONE -acetaminophen  5-325 MG tablet Commonly known as: PERCOCET/ROXICET       TAKE these medications    acetaminophen  500 MG tablet Commonly known as: TYLENOL  Take 500 mg by mouth 4 (four) times daily as needed for mild pain or moderate pain.   atorvastatin  80 MG tablet Commonly known as: LIPITOR  Take 80 mg by mouth at bedtime.   benazepril  20 MG tablet Commonly known as: LOTENSIN  Take 1 tablet (20 mg total) by mouth daily. What changed:  medication strength how much to take   cholecalciferol  25 MCG (1000 UNIT) tablet Commonly known as: VITAMIN D3 Take 500 Units by mouth daily.   dexamethasone  2 MG tablet Commonly known as: DECADRON  Take 1 tablet (2 mg total) by mouth daily.   dofetilide  500 MCG capsule Commonly known as: TIKOSYN  TAKE 1 CAPSULE BY MOUTH TWICE DAILY   escitalopram  5 MG tablet Commonly known as: LEXAPRO  Take 1 tablet (5 mg total) by mouth daily.   ezetimibe  10 MG tablet Commonly known as: ZETIA  TAKE 1 TABLET BY MOUTH ONCE DAILY   FreeStyle Libre 14 Day Sensor Misc SMARTSIG:1 Kit(s) Topical Every 2 Weeks   insulin  glargine 100 UNIT/ML injection Commonly known as: LANTUS  Inject 0.46 mLs (46 Units total) into the skin 2 (two) times daily. Replaces: Lantus  SoloStar 100 UNIT/ML Solostar Pen    insulin  lispro 100 UNIT/ML KiwkPen Commonly known as: HUMALOG  Inject 0.1 mLs (10 Units total) into the skin 3 (three) times daily. Sliding Scale What changed: how much to take   INSULIN  SYRINGE 1CC/29G 29G X 1/2 1 ML Misc USE AS DIRECTED   Jardiance  25 MG Tabs tablet Generic drug: empagliflozin  Take 12.5 mg by mouth daily.   levETIRAcetam  500 MG tablet Commonly known as: Keppra  Take 1 tablet (500 mg total) by mouth 2 (two) times daily.   LORazepam  0.5 MG tablet Commonly known as: ATIVAN  Take 1 tablet (0.5  mg total) by mouth daily after supper. May take an additional 1 tablet (0.5 mg total) by mouth daily as needed for anxiety.   magnesium  oxide 400 (240 Mg) MG tablet Commonly known as: MAG-OX Take 1 tablet (400 mg total) by mouth daily.   metoprolol  succinate 25 MG 24 hr tablet Commonly known as: TOPROL -XL TAKE 1 TABLET BY MOUTH ONCE DAILY   nitroGLYCERIN  0.4 MG SL tablet Commonly known as: NITROSTAT  Place 1 tablet (0.4 mg total) under the tongue every 5 (five) minutes as needed for chest pain.   ONE TOUCH ULTRA TEST test strip Generic drug: glucose blood TEST THREE TIMES A DAY AS DIRECTED.   pantoprazole  40 MG tablet Commonly known as: PROTONIX  Take 1 tablet (40 mg total) by mouth daily. Stop taking if no longer on decadron .   Xarelto  20 MG Tabs tablet Generic drug: rivaroxaban  Take 1 tablet (20 mg total) by mouth daily with supper.        Follow-up Information     Feldpausch, Cheryl BRAVO, MD Follow up.   Specialty: Family Medicine Why: Call in 1-2 days for post hospital follow up Contact information: [address] [place] [address] [phone]         Lorilee Sven SQUIBB, MD Follow up.   Specialty: Physical Medicine and Rehabilitation Why: office will call you with follow up appointment Contact information: [address] [place] [address] [phone]         Vaslow, Zachary K, MD Follow up.   Specialties: Psychiatry,  Neurology, Oncology Contact information: [address] [place] [address] [phone]         Lenn Aran, MD Follow up.   Specialty: Radiation Oncology Contact information: [address]   [address]   [place] [address] [phone]         [facility] [place] Follow up on 04/09/2024.   Specialty: Vascular Surgery Why: appointment at 2 pm for staple removal Contact information: [address] [place]  [address] [phone]                Signed: Daphne LOISE Satterfield 03/25/2024, 2:55 PM

## 2024-03-25 NOTE — Progress Notes (Signed)
 Speech Language Pathology Discharge Summary  Patient Details  Name: Douglas Hunter MRN: [id number] Date of Birth: [birth year]  Date of Discharge from SLP service:March 25, 2024  Today's Date: 03/25/2024 SLP Individual Time: 8569-8472 SLP Individual Time Calculation (min): 57 min  Patient has met 4 of 4 long term goals.  Patient to discharge at [facility] level.  Reasons goals not met:     Clinical Impression/Discharge Summary: Pt has made excellent gains and has met 4 of 4 LTG's this admission due to improved recall, problem solving, and attention. Pt is curently an overall min A for cognitive tasks and communication. Pt/ family education complete and pt will discharge home with supervision from family, friends, etc. Pt would benefit from home health/ outpatient f/u SLP to maximize cognition in order to maximize functional independence.  Care Partner:  Caregiver Able to Provide Assistance: Yes  Type of Caregiver Assistance: Cognitive;Physical  Recommendation:  Outpatient SLP  Rationale for SLP Follow Up: Maximize cognitive function and independence;Maximize functional communication   Equipment: none   Reasons for discharge: Treatment goals met;Discharged from hospital   Patient/Family Agrees with Progress Made and Goals Achieved: Yes    [name] 03/25/2024, 4:08 PM

## 2024-03-25 NOTE — Plan of Care (Signed)
" °  Problem: RH Comprehension Communication Goal: LTG Patient will comprehend basic/complex auditory (SLP) Description: LTG: Patient will comprehend basic/complex auditory information with cues (SLP). Outcome: Completed/Met Flowsheets (Taken 03/22/2024 0747 by Berna Recardo LABOR, CCC-SLP) LTG: Patient will comprehend:  Basic auditory information  Complex auditory information LTG: Patient will comprehend auditory information with cueing (SLP): Minimal Assistance - Patient > 75%   Problem: RH Expression Communication Goal: LTG Patient will verbally express basic/complex needs(SLP) Description: LTG:  Patient will verbally express basic/complex needs, wants or ideas with cues  (SLP) Outcome: Completed/Met Flowsheets (Taken 03/22/2024 0747 by Berna Recardo LABOR, CCC-SLP) LTG: Patient will verbally express basic/complex needs, wants or ideas (SLP): Minimal Assistance - Patient > 75%   Problem: RH Problem Solving Goal: LTG Patient will demonstrate problem solving for (SLP) Description: LTG:  Patient will demonstrate problem solving for basic/complex daily situations with cues  (SLP) Outcome: Completed/Met Flowsheets Taken 03/22/2024 0827 by Berna Recardo LABOR, CCC-SLP LTG: Patient will demonstrate problem solving for (SLP): Basic daily situations Taken 03/22/2024 0747 by Berna Recardo LABOR, CCC-SLP LTG Patient will demonstrate problem solving for: Minimal Assistance - Patient > 75%   Problem: RH Attention Goal: LTG Patient will demonstrate this level of attention during functional activites (SLP) Description: LTG:  Patient will will demonstrate this level of attention during functional activites (SLP) Outcome: Completed/Met Flowsheets Taken 03/22/2024 0747 by Berna Recardo LABOR, CCC-SLP Patient will demonstrate this level of attention during cognitive/linguistic activities in: Controlled LTG: Patient will demonstrate this level of attention during cognitive/linguistic activities with assistance of (SLP):  Supervision Number of minutes patient will demonstrate attention during cognitive/linguistic activities: 15 Taken 03/17/2024 1755 by Berna Recardo LABOR, CCC-SLP Patient will demonstrate during cognitive/linguistic activities the attention type of: Sustained   "

## 2024-03-25 NOTE — Progress Notes (Signed)
 Occupational Therapy Discharge Summary  Patient Details  Name: Douglas Hunter MRN: [id number] Date of Birth: 04/09/1955  Date of Discharge from OT service:March 25, 2024   Patient has met 7 of 8 long term goals due to improved activity tolerance, improved balance, ability to compensate for deficits, and improved coordination.  Patient to discharge at overall Supervision level.  Patient's care partner is independent to provide the necessary physical and cognitive assistance at discharge.    Reasons goals not met: Dynamic standing balance goal not met due to cognitive, gross motor and general strength deficits  Recommendation:  Patient will benefit from ongoing skilled OT services in outpatient setting to continue to advance functional skills in the area of BADL and Reduce care partner burden.  Equipment: No equipment provided  Reasons for discharge: treatment goals met and discharge from hospital  Patient/family agrees with progress made and goals achieved: Yes  OT Discharge Precautions/Restrictions  Precautions Precautions: Fall Restrictions Weight Bearing Restrictions Per Provider Order: Yes LLE Weight Bearing Per Provider Order: Non weight bearing ADL ADL Eating: Modified independent Where Assessed-Eating: Wheelchair Grooming: Modified independent Where Assessed-Grooming: Sitting at sink Upper Body Bathing: Setup Where Assessed-Upper Body Bathing: Shower Lower Body Bathing: Supervision/safety Where Assessed-Lower Body Bathing: Shower Upper Body Dressing: Modified independent (Device) Where Assessed-Upper Body Dressing: Wheelchair Lower Body Dressing: Supervision/safety Where Assessed-Lower Body Dressing: Edge of bed Toileting: Supervision/safety Where Assessed-Toileting: Bedside Commode Toilet Transfer: Modified independent Toilet Transfer Method: Ambulance Person: Extra wide drop arm bedside commode Tub/Shower Transfer: Close  supervison Web Designer Method: Conservation Officer, Historic Buildings: Insurance Underwriter: Close supervision Film/video Editor Method: Administrator: Sales Promotion Account Executive Baseline Vision/History: 1 Wears glasses Patient Visual Report: No change from baseline Vision Assessment?: Wears glasses for reading Perception  Perception: Within Functional Limits Praxis Praxis: WFL Cognition Cognition Overall Cognitive Status: Within Functional Limits for tasks assessed Arousal/Alertness: Awake/alert Orientation Level: Person;Place;Situation Person: Oriented Place: Oriented Situation: Oriented Memory: Impaired Awareness: Appears intact Problem Solving: Appears intact Safety/Judgment: Appears intact Brief Interview for Mental Status (BIMS) Repetition of Three Words (First Attempt): 3 Temporal Orientation: Year: Correct Temporal Orientation: Month: Accurate within 5 days Temporal Orientation: Day: Incorrect Recall: Sock: No, could not recall Recall: Blue: No, could not recall Recall: Bed: No, could not recall BIMS Summary Score: 8 Sensation Sensation Light Touch: Impaired by gross assessment Proprioception: Appears Intact Coordination Gross Motor Movements are Fluid and Coordinated: No Fine Motor Movements are Fluid and Coordinated: Yes Finger Nose Finger Test: [facility] Motor  Motor Motor: Within Functional Limits Mobility  Bed Mobility Bed Mobility: Rolling Right;Rolling Left;Sit to Supine;Supine to Sit Rolling Right: Independent with assistive device Rolling Left: Independent with assistive device Supine to Sit: Independent with assistive device Sit to Supine: Independent with assistive device Transfers Sit to Stand: Supervision/Verbal cueing Stand to Sit: Supervision/Verbal cueing  Trunk/Postural Assessment  Cervical Assessment Cervical Assessment: Within Functional Limits Thoracic Assessment Thoracic  Assessment: Within Functional Limits Lumbar Assessment Lumbar Assessment: Within Functional Limits Postural Control Postural Control: Deficits on evaluation Righting Reactions: delayed on L Protective Responses: delayed on L  Balance Balance Balance Assessed: Yes Dynamic Sitting Balance Dynamic Sitting - Balance Support: Feet supported;No upper extremity supported Dynamic Sitting - Level of Assistance: 6: Modified independent (Device/Increase time) Static Standing Balance Static Standing - Balance Support: Bilateral upper extremity supported Static Standing - Level of Assistance: 5: Stand by assistance (supervision) Dynamic Standing Balance Dynamic Standing - Balance Support: Bilateral upper  extremity supported;During functional activity Dynamic Standing - Level of Assistance: 5: Stand by assistance (CGA) Extremity/Trunk Assessment RUE Assessment RUE Assessment: Within Functional Limits LUE Assessment LUE Assessment: Within Functional Limits   Kaelyn Innocent E Ita Fritzsche, MS, OTR/L  03/25/2024, 3:41 PM

## 2024-03-26 ENCOUNTER — Other Ambulatory Visit (HOSPITAL_COMMUNITY): Payer: Self-pay

## 2024-03-26 ENCOUNTER — Ambulatory Visit: Admitting: Radiation Oncology

## 2024-03-26 LAB — CBC WITH DIFFERENTIAL/PLATELET
Abs Immature Granulocytes: 0.21 K/uL — ABNORMAL HIGH (ref 0.00–0.07)
Basophils Absolute: 0.1 K/uL (ref 0.0–0.1)
Basophils Relative: 0 %
Eosinophils Absolute: 0.1 K/uL (ref 0.0–0.5)
Eosinophils Relative: 1 %
HCT: 34.2 % — ABNORMAL LOW (ref 39.0–52.0)
Hemoglobin: 11.6 g/dL — ABNORMAL LOW (ref 13.0–17.0)
Immature Granulocytes: 1 %
Lymphocytes Relative: 22 %
Lymphs Abs: 3.4 K/uL (ref 0.7–4.0)
MCH: 31.3 pg (ref 26.0–34.0)
MCHC: 33.9 g/dL (ref 30.0–36.0)
MCV: 92.2 fL (ref 80.0–100.0)
Monocytes Absolute: 1.1 K/uL — ABNORMAL HIGH (ref 0.1–1.0)
Monocytes Relative: 7 %
Neutro Abs: 10.4 K/uL — ABNORMAL HIGH (ref 1.7–7.7)
Neutrophils Relative %: 69 %
Platelets: 358 K/uL (ref 150–400)
RBC: 3.71 MIL/uL — ABNORMAL LOW (ref 4.22–5.81)
RDW: 14.7 % (ref 11.5–15.5)
WBC: 15.2 K/uL — ABNORMAL HIGH (ref 4.0–10.5)
nRBC: 0 % (ref 0.0–0.2)

## 2024-03-26 LAB — GLUCOSE, CAPILLARY
Glucose-Capillary: 112 mg/dL — ABNORMAL HIGH (ref 70–99)
Glucose-Capillary: 142 mg/dL — ABNORMAL HIGH (ref 70–99)

## 2024-03-26 LAB — MAGNESIUM: Magnesium: 1.9 mg/dL (ref 1.7–2.4)

## 2024-03-26 LAB — BASIC METABOLIC PANEL WITH GFR
Anion gap: 10 (ref 5–15)
BUN: 30 mg/dL — ABNORMAL HIGH (ref 8–23)
CO2: 23 mmol/L (ref 22–32)
Calcium: 8.7 mg/dL — ABNORMAL LOW (ref 8.9–10.3)
Chloride: 100 mmol/L (ref 98–111)
Creatinine, Ser: 1.02 mg/dL (ref 0.61–1.24)
GFR, Estimated: >60 mL/min
Glucose, Bld: 152 mg/dL — ABNORMAL HIGH (ref 70–99)
Potassium: 4.6 mmol/L (ref 3.5–5.1)
Sodium: 133 mmol/L — ABNORMAL LOW (ref 135–145)

## 2024-03-26 NOTE — Progress Notes (Signed)
 Inpatient Rehabilitation Discharge Medication Review by a Pharmacist  A complete drug regimen review was completed for this patient to identify any potential clinically significant medication issues.  High Risk Drug Classes Is patient taking? Indication by Medication  Antipsychotic No   Anticoagulant Yes Xarelto - PAF  Antibiotic No   Opioid No   Antiplatelet No   Hypoglycemics/insulin  Yes Insulin - T2DM Jardiance - T2DM  Vasoactive Medication Yes Lotensin - HTN Dofetilide - PAF Toprol - rate control nitroSL- angina  Chemotherapy No   Other Yes Lipitor - HLD Zetia - HLD Lexapro - anxiety Keppra - seizure ppx Ativan - anxiety Protonix - GERD     Type of Medication Issue Identified Description of Issue Recommendation(s)  Drug Interaction(s) (clinically significant)     Duplicate Therapy     Allergy     No Medication Administration End Date     Incorrect Dose     Additional Drug Therapy Needed     Significant med changes from prior encounter (inform family/care partners about these prior to discharge).    Other       Clinically significant medication issues were identified that warrant physician communication and completion of prescribed/recommended actions by midnight of the next day:  No    Time spent performing this drug regimen review (minutes):  30   Cambri Plourde BS, PharmD, BCPS Clinical Pharmacist 03/26/2024 7:18 AM  Contact: [phone] after 3 PM

## 2024-03-26 NOTE — Progress Notes (Signed)
 Inpatient Rehabilitation Care Coordinator Discharge Note   Patient Details  Name: Douglas Hunter MRN: [id number] Date of Birth: 1955-10-23   Discharge location: Home with his wife and children providing support  Length of Stay: 10 days  Discharge activity level: Supervision/Verbal cueing  Home/community participation: Active in the community  Patient response un:Yzjouy Literacy - How often do you need to have someone help you when you read instructions, pamphlets, or other written material from your doctor or pharmacy?: Never  Patient response un:Dnrpjo Isolation - How often do you feel lonely or isolated from those around you?: Never  Services provided included: MD, RD, PT, OT, SLP, CM, RN, TR, Pharmacy, Neuropsych, SW (Peer support)  Field Seismologist:  Field Seismologist Utilized: Private Insurance [employer] MEDICARE / [facility] MEDICARE  Choices offered to/list presented to: Patient/Family  Follow-up services arranged:  Outpatient    Outpatient Servicies: [facility] PT/OT      Patient response to transportation need: Is the patient able to respond to transportation needs?: Yes In the past 12 months, has lack of transportation kept you from medical appointments or from getting medications?: No In the past 12 months, has lack of transportation kept you from meetings, work, or from getting things needed for daily living?: No   Patient/Family verbalized understanding of follow-up arrangements:  Yes  Individual responsible for coordination of the follow-up plan: Patient/Family  Confirmed correct DME delivered: Douglas  Hunter 03/26/2024    Comments (or additional information): Patient discharging home with wife and family able to address 24/7 care needs.   Summary of Stay    Date/Time Discharge Planning CSW  03/19/24 1351 Patient discharging home with [place], [place]. Home equipped with accommodations (ramp, shower chair). Would like to have OP therapy at  [facility] since that is where the pateint goes for radiation. Declined neuropsych.  Awaiting therapy follow-up recommendations. DS       Douglas  Hunter

## 2024-03-26 NOTE — Progress Notes (Signed)
 "                                                        PROGRESS NOTE   Subjective/Complaints: Discharging home today Wife has scheduled outpatient oncology f/u Discussed today's lab work Discussed risks and benefits of his anxiety medications  ROS: +anxiety   Objective:   No results found. Recent Labs    03/25/24 0504 03/26/24 0426  WBC 15.9* 15.2*  HGB 11.6* 11.6*  HCT 34.9* 34.2*  PLT 333 358   Recent Labs    03/25/24 0504 03/26/24 0426  NA 132* 133*  K 4.4 4.6  CL 99 100  CO2 22 23  GLUCOSE 165* 152*  BUN 23 30*  CREATININE 1.04 1.02  CALCIUM  8.5* 8.7*    Intake/Output Summary (Last 24 hours) at 03/26/2024 1007 Last data filed at 03/26/2024 0054 Gross per 24 hour  Intake 472 ml  Output 2875 ml  Net -2403 ml        Physical Exam: Vital Signs Blood pressure 137/75, pulse (!) 59, temperature 98 F (36.7 C), temperature source Oral, resp. rate 19, height 5' 11 (1.803 m), weight 105.6 kg, SpO2 96%. Gen: no distress, normal appearing HEENT: oral mucosa pink and moist, NCAT Cardio: Reg rate Chest: normal effort, normal rate of breathing Abd: soft, non-distended Ext: no edema Psych: pleasant, normal affect Skin: LLE AKA well healed Neuro: Aox4, no cognitive deficits, sensation intact, strength 5/5 throughout Musculoskeletal: LLE AKA with shrinker in place   Assessment/Plan: 1. Functional deficits which require 3+ hours per day of interdisciplinary therapy in a comprehensive inpatient rehab setting. Physiatrist is providing close team supervision and 24 hour management of active medical problems listed below. Physiatrist and rehab team continue to assess barriers to discharge/monitor patient progress toward functional and medical goals  Care Tool:  Bathing    Body parts bathed by patient: Right arm, Left arm, Chest, Abdomen, Front perineal area, Buttocks, Right upper leg, Left upper leg, Right lower leg, Face   Body parts bathed by helper: Left  lower leg Body parts n/a: Left lower leg   Bathing assist Assist Level: Supervision/Verbal cueing     Upper Body Dressing/Undressing Upper body dressing   What is the patient wearing?: Pull over shirt    Upper body assist Assist Level: Independent with assistive device    Lower Body Dressing/Undressing Lower body dressing      What is the patient wearing?: Pants, Underwear/pull up     Lower body assist Assist for lower body dressing: Supervision/Verbal cueing     Toileting Toileting    Toileting assist Assist for toileting: Supervision/Verbal cueing     Transfers Chair/bed transfer  Transfers assist     Chair/bed transfer assist level: Independent with assistive device Chair/bed transfer assistive device:  (squat<>pivot)   Locomotion Ambulation   Ambulation assist      Assist level: Contact Guard/Touching assist Assistive device: Parallel bars Max distance: 44ft   Walk 10 feet activity   Assist  Walk 10 feet activity did not occur: Safety/medical concerns (L AKA/weakness/fatigue)  Assist level: Contact Guard/Touching assist Assistive device: Parallel bars   Walk 50 feet activity   Assist Walk 50 feet with 2 turns activity did not occur: Safety/medical concerns (fatigue, weakness, decreased balance/coordination)         Walk  150 feet activity   Assist Walk 150 feet activity did not occur: Safety/medical concerns (fatigue, weakness, decreased balance/coordination)         Walk 10 feet on uneven surface  activity   Assist Walk 10 feet on uneven surfaces activity did not occur: Safety/medical concerns (fatigue, weakness, decreased balance/coordination)         Wheelchair     Assist Is the patient using a wheelchair?: Yes Type of Wheelchair: Manual    Wheelchair assist level: Independent Max wheelchair distance: 158ft    Wheelchair 50 feet with 2 turns activity    Assist        Assist Level: Independent    Wheelchair 150 feet activity     Assist  Wheelchair 150 feet activity did not occur:  (based on max distance of 110 feet per PT documentation)   Assist Level: Independent   Blood pressure 137/75, pulse (!) 59, temperature 98 F (36.7 C), temperature source Oral, resp. rate 19, height 5' 11 (1.803 m), weight 105.6 kg, SpO2 96%.  Medical Problem List and Plan: 1. Functional deficits secondary to AKA due to critical limb ischemia             -patient may shower with limb covered             -ELOS/Goals: 10-14 days, [facility] PT/OT-- d/c home             - Approved for shrinker             Grounds pass ordered -continue Xarelto  20mg  daily, continue protonix  for GI ppx.              This patient is capable of making decisions on his own behalf   2. Phantom limb sensation --tylenol  prn mild pain, decrease perocet to 1 tab q4H prn             - 12/22: Pain well-controlled on current regimen   3. Insomnia: continue Ativan . Very anxious about lack of sleep             -antipsychotic agents: N/A             -Melatonin 5mg  nightly -03/23/24 on ativan  scheduled 0.5mg  at supper time, and 0.5mg  PRN, expressed he was able to sleep with night meds so will leave meds alone   4. H/o A fib/flutter/NSVT: Monitor HR TID and for symptoms with activity             --on Tikosyn  500mcg BID, Toprol  25mg  daily and Xarelto              -supplement potassium and magnesium  to goal of 4 and 2 respectively   5.  L-AKA for limb ischemia: Monitor for healing. Staple removal appt Jan 6th.   6.  T2DM: w/retinopathy OD: HGb A1c- 8.0. PTA on Jardiance , Lantus  25U bid and humalog  16U TID --currently on Insulin  glargine 45 units BID (adjusted to home regimen of b'fast and supper) with novolog  10 units TID --change SSI to moderate as anticipate BS to improve with intensiver rehab program.  -d/c feeding supplement -increase lantus  to 46U BID -discussed that yesterday's CBGs were stable -03/23/24 some concern that his  home meter isn't accurate per nursing, CBGs somewhat erratic but improving since Lantus  increase 12/16. Monitor.  -03/24/24 CBGs still very erratic but maybe better overall; cont to monitor, but would maybe see if we can confirm accuracy of his home meter   12/22: Blood sugars remain erratic; home meter read  115 while hospital meter read 144 this a.m.  Given improvement in creatinine, greater than 2 weeks postop, no signs of infection, will resume Jardiance  (takes 12.5 mg at home; closest hospital dose 10 mg daily)-reduce Lantus  to 45 U BID for simplicity of dosing-repeat BMP in AM prior to discharge    CBG (last 3)-- from fingersticks, NOT from meter Recent Labs (last 2 labs)       Recent Labs    03/22/24 2047 03/23/24 2024 03/25/24 0453  GLUCAP 162* 233* 144*      7.  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 On Decadron  2 mg daily. Monitor for neurologic symptoms. --per Radiation Onc noted 12/01-->plans were to keep patient on decadron  2 mg BID and possibly increase over radiation Tx depending on symptoms.  - 12/13 patient and spouse would like to know when chemo can be resumed, consider checking with oncologist next week Vitamin C  supplement d/ced --12/22:  Advised with family to call oncology on discharge for further recs     8.  CKD 3a: Baseline SCr @1 .3- 1.5 range for past couple of years             --acute on chronic renal failure improved with IVF and d/c of Jardiance .              - 12-22: BUN normalized, creatinine well below baseline at 1.04.  P.o. intakes adequate.  Resume Jardiance  as above, monitor in AM.   9.  Leucocytosis: WBC 14.9 at admission and has been has been in 13-14 range             --likely due to decadron  for cerebral edema.              12/22: Slightly up today to 15.9; no external signs of infection, likely variance.  Repeat in AM.   10. OSA: Compliant with CPAP use at home but refusing in hospital.  --wife to bring his machine at  home which will help with sleep.    11. Acute blood loss anemia: reviewed and is stable   12. Situational depression w/anxiety: continue lexapro  5mg  daily- added this admission, ativan  added after supper, discussed risks and benefits of his anxiety medications and supplements   13. Class 1 obesity: BMI: 33.8 @ admission-->has been losing weight for past 2-3 months.    14. HLD: continue Lipitor  80mg  nightly and Zetia  10mg  daily   15. HTN: continue Benazepril  20mg  daily, Toprol  as above, IV magnesium  ordered 12/15, magnesium  level reviewed and is normal 12/16, magnesium  level ordered 12/18 and was normal at 2, increase magnesium  oxide to 400mg  daily   >30 minutes spent in discharge of patient including review of medications and follow-up appointments, physical examination, and in answering all patient's questions   LOS: 11 days A FACE TO FACE EVALUATION WAS PERFORMED  Namiah Dunnavant P Jami Ohlin 03/26/2024, 10:07 AM     "

## 2024-03-27 ENCOUNTER — Ambulatory Visit: Admitting: Speech Pathology

## 2024-04-02 ENCOUNTER — Other Ambulatory Visit: Payer: Self-pay | Admitting: Pharmacist

## 2024-04-02 ENCOUNTER — Other Ambulatory Visit: Payer: Self-pay | Admitting: Internal Medicine

## 2024-04-02 ENCOUNTER — Other Ambulatory Visit (HOSPITAL_COMMUNITY): Payer: Self-pay

## 2024-04-02 ENCOUNTER — Other Ambulatory Visit: Payer: Self-pay | Admitting: Pharmacy Technician

## 2024-04-02 ENCOUNTER — Encounter: Payer: Self-pay | Admitting: Internal Medicine

## 2024-04-02 ENCOUNTER — Encounter: Payer: Self-pay | Admitting: Radiation Oncology

## 2024-04-02 ENCOUNTER — Other Ambulatory Visit: Payer: Self-pay

## 2024-04-02 ENCOUNTER — Ambulatory Visit: Admitting: Speech Pathology

## 2024-04-02 ENCOUNTER — Ambulatory Visit
Admission: RE | Admit: 2024-04-02 | Discharge: 2024-04-02 | Disposition: A | Discharge status: Home / Self Care | Source: Ambulatory Visit | Attending: Radiation Oncology | Admitting: Radiation Oncology

## 2024-04-02 VITALS — BP 126/55 | HR 49 | Resp 16 | Ht 71.0 in

## 2024-04-02 DIAGNOSIS — C719 Malignant neoplasm of brain, unspecified: Secondary | ICD-10-CM | POA: Insufficient documentation

## 2024-04-02 MED ORDER — TEMOZOLOMIDE 20 MG PO CAPS
20.0000 mg | ORAL_CAPSULE | Freq: Every day | ORAL | 0 refills | Status: AC
  Filled 2024-04-02: qty 42, 42d supply, fill #0

## 2024-04-02 MED ORDER — TEMOZOLOMIDE 140 MG PO CAPS
140.0000 mg | ORAL_CAPSULE | Freq: Every day | ORAL | 0 refills | Status: AC
  Filled 2024-04-02: qty 42, 42d supply, fill #0

## 2024-04-02 NOTE — Progress Notes (Signed)
 Radiation Oncology Follow up Note  Name: Douglas Hunter   Date:   04/02/2024 MRN:  [id number] DOB: 13-Dec-1955    This 68 y.o. male presents to the clinic today for reevaluation to start radiation therapy partial brain radiation for GBM status post resection.  REFERRING PROVIDER: [name], MD  HPI: Patient is a 68 year old male originally consulted back in early December for a left temporal GBM status post resection.  We had planned ongoing ahead with IMRT radiation therapy to his resection cavity to deliver 60 Gray over 6 weeks with concurrent Temodar  therapy.  His simulation was interrupted by.  A left AKA by Dr. Vonzell.  He initially was admitted for a thrombectomy of the left femoral femoral orbital and posterior tibial thrombectomy with compartment fasciotomy.  His limb was not thought to be salvageable.  He is seen today and is doing well.  He is starting to have some more headaches although he is on extremely low-dose of Decadron  2 mg once a day.  I am upping that to 2 mg twice daily.  He is having no focal neurologic deficits no change in crude crude visual fields.  COMPLICATIONS OF TREATMENT: none  FOLLOW UP COMPLIANCE: keeps appointments   PHYSICAL EXAM:  BP (!) 126/55   Pulse (!) 49   Resp 16   Ht 5' 11 (1.803 m)   BMI 32.47 kg/m  Patient is status post a left AKA.  Crude visual fields are within normal range cranial nerves II through XII are grossly intact cardiac examination essentially unremarkable abdomen is benign lungs are clear.  RADIOLOGY RESULTS: No current films for review for review.  PLAN: At this time like to go ahead with his simulation for partial brain radiation to his postsurgical cavity with diagnosis of GBM.  Again risks and benefits of treatment including parse possible area of hair loss skin reaction fatigue alteration of blood counts all were reviewed in detail with the patient.  I have scheduled him for simulation early next week.  Patient  comprehends my recommendations well.  Again I am upping his Decadron  to 2 mg twice daily secondary to the headaches.  We can adjust that accordingly in the future.  Patient comprehends my recommendations well.  I would like to take this opportunity to thank you for allowing me to participate in the care of your patient.SABRA Marcey Penton, MD

## 2024-04-02 NOTE — Progress Notes (Signed)
 Specialty Pharmacy Initial Fill Coordination Note  Douglas Hunter is a 68 y.o. male contacted today regarding initial fill of specialty medication(s) Temozolomide  (TEMODAR )   Patient requested Delivery   Delivery date: 04/05/24   Verified address: [address] [place] [address]   Medication will be filled on: 04/03/24   Patient is aware of $111.83 & $42.92 copayment.   Initial fill was never sent out originally d/t surgery and potential dose change.  [name] ([name]) [name], CPhT  [facility], [name], [facility] - Oral Chemotherapy Patient Advocate Specialist III Phone: [phone]  Fax: [phone]

## 2024-04-03 ENCOUNTER — Other Ambulatory Visit: Payer: Self-pay

## 2024-04-05 ENCOUNTER — Other Ambulatory Visit: Payer: Self-pay

## 2024-04-08 ENCOUNTER — Other Ambulatory Visit (HOSPITAL_COMMUNITY): Payer: Self-pay

## 2024-04-08 DIAGNOSIS — C719 Malignant neoplasm of brain, unspecified: Secondary | ICD-10-CM

## 2024-04-08 MED ORDER — ONDANSETRON HCL 8 MG PO TABS: 8.0000 mg | ORAL_TABLET | Freq: Three times a day (TID) | ORAL | 1 refills | Status: DC | PRN

## 2024-04-09 ENCOUNTER — Ambulatory Visit
Admission: RE | Admit: 2024-04-09 | Discharge: 2024-04-09 | Disposition: A | Discharge status: Home / Self Care | Source: Ambulatory Visit | Attending: Radiation Oncology | Admitting: Radiation Oncology

## 2024-04-09 ENCOUNTER — Other Ambulatory Visit: Payer: Self-pay

## 2024-04-09 ENCOUNTER — Inpatient Hospital Stay: Admitting: Pharmacist

## 2024-04-09 ENCOUNTER — Ambulatory Visit: Admitting: Speech Pathology

## 2024-04-09 ENCOUNTER — Ambulatory Visit: Attending: Vascular Surgery | Admitting: Physician Assistant

## 2024-04-09 VITALS — BP 135/70 | HR 59 | Ht 71.0 in | Wt 233.0 lb

## 2024-04-09 VITALS — BP 115/68 | HR 52 | Temp 97.5°F

## 2024-04-09 DIAGNOSIS — R5383 Other fatigue: Secondary | ICD-10-CM | POA: Insufficient documentation

## 2024-04-09 DIAGNOSIS — C712 Malignant neoplasm of temporal lobe: Secondary | ICD-10-CM | POA: Insufficient documentation

## 2024-04-09 DIAGNOSIS — Z79899 Other long term (current) drug therapy: Secondary | ICD-10-CM | POA: Insufficient documentation

## 2024-04-09 DIAGNOSIS — M7989 Other specified soft tissue disorders: Secondary | ICD-10-CM | POA: Insufficient documentation

## 2024-04-09 DIAGNOSIS — C719 Malignant neoplasm of brain, unspecified: Secondary | ICD-10-CM | POA: Insufficient documentation

## 2024-04-09 DIAGNOSIS — R569 Unspecified convulsions: Secondary | ICD-10-CM | POA: Insufficient documentation

## 2024-04-09 DIAGNOSIS — Z89612 Acquired absence of left leg above knee: Secondary | ICD-10-CM

## 2024-04-09 DIAGNOSIS — Z7952 Long term (current) use of systemic steroids: Secondary | ICD-10-CM | POA: Insufficient documentation

## 2024-04-09 NOTE — Patient Instructions (Signed)
 [facility] - A DEPT OF [facility]    Thank you for choosing [facility] to provide your oncology/hematology care and for allowing us  to participate in your care today!  As a reminder, we spoke about the following today: Temodar  (temozolomide ) for the treatment of glioblastoma, MGMT methylated, IDH WT, in conjunction with radiation, planned duration 42 days.   Treatment goal: Control  Medication handout has been provided.   **For oral cancer medication prescription refill requests, contact your pharmacy and they will contact our office if needed. Allow 5-7 days for refills to be completed by your specialty pharmacy.    Cancer Center General Instructions:  If you have an appointment at the [facility], please go directly to the Cancer Center and check in at the registration area.  We strive to give you quality time with your provider. You may need to reschedule your appointment if you arrive late (15 or more minutes).  Arriving late affects you and other patients whose appointments are after yours.  Also, if you miss three or more appointments without notifying the office, you may be dismissed from the clinic at the provider's discretion.      BELOW ARE SYMPTOMS THAT SHOULD BE REPORTED IMMEDIATELY: *FEVER GREATER THAN 100.4 F (38 C) OR HIGHER *CHILLS OR SWEATING *NAUSEA AND VOMITING THAT IS NOT CONTROLLED WITH YOUR NAUSEA MEDICATION *UNUSUAL SHORTNESS OF BREATH *UNUSUAL BRUISING OR BLEEDING *URINARY PROBLEMS (pain or burning when urinating, or frequent urination) *BOWEL PROBLEMS (unusual diarrhea, constipation, pain near the anus) TENDERNESS IN MOUTH AND THROAT WITH OR WITHOUT PRESENCE OF ULCERS (sore throat, sores in mouth, or a toothache) UNUSUAL RASH, SWELLING OR PAIN  UNUSUAL VAGINAL DISCHARGE OR ITCHING   Items with * indicate a potential emergency and should be followed up as soon as possible or go to the Emergency Department if  any problems should occur.  Please show the CHEMOTHERAPY ALERT CARD at check-in to the Emergency Department and triage nurse.  Should you have questions after your visit or need to cancel or reschedule your appointment, please contact [facility] - A DEPT OF [facility]  [phone] and follow the prompts.  Office hours are 8:00 a.m. to 4:30 p.m. Monday - Friday. Please note that voicemails left after 4:00 p.m. may not be returned until the following business day.  We are closed weekends and major holidays. You have access to a nurse at all times for urgent questions. Please call the main number to the clinic [phone] and follow the prompts.  For any non-urgent questions, you may also contact your provider using MyChart. We now offer e-Visits for anyone 44 and older to request care online for non-urgent symptoms. For details visit [email or web address].   Also download the MyChart app! Go to the app store, search MyChart, open the app, select [facility], and log in with your MyChart username and password.

## 2024-04-09 NOTE — Progress Notes (Signed)
 "  Clinical Pharmacist Practitioner Clinic [facility]  [phone] [phone]  Patient Care Team: [name], MD as PCP - General (Family Medicine) Perla Evalene PARAS, MD as PCP - Cardiology (Cardiology) Cindie Ole DASEN, MD as PCP - Electrophysiology (Cardiology) Perla Evalene PARAS, MD as Consulting Physician (Cardiology) Lenn Aran, MD as Consulting Physician (Radiation Oncology)   Name of the patient: Douglas Hunter  [id number]  [birth year]   Date of visit: 04/09/2024   HPI: Patient is a 69 y.o. 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 Planned treatment with radiation and temozolomide .  Patient will start radiation on 04/22/2024, but begin his temozolomide  the evening before on 04/21/2024.  Reason for Consult: Temozolomide  oral chemotherapy education.   PAST MEDICAL HISTORY: Past Medical History:  Diagnosis Date   AKI (acute kidney injury) 03/13/2020   Atrial flutter (HCC)    Coronary artery disease 08/2011   a. NSTEMI 08/2011, LHC w/ severe disease of LCx with good collaterals, lesion was high-risk for intervention due to a steep angulation of the LCx coming from the left main coronary artery as well as heavy calcifications, discused at cath conference w/ consensus advising med Rx; b. 03/2020 Cath: LM nl, LAD 10m, LCX 100p w/ R->L and L->L collats. RCA dominant, nl. EF 55%. PA 33/6/19, PCWP 8.   GERD (gastroesophageal reflux disease)    H/O brain tumor 01/2024   History of echocardiogram    a. 01/2020 Echo: EF 60-65%, no rwma. Sev elev PASP. Mod dil LA.   Hyperlipidemia    Hypertension    Myocardial infarction [facility])    Paroxysmal atrial fibrillation (HCC)    a. s/p ablation 01/2013; b CHADS2VASc => 3 (HTN, DM, vascular disease); c. on Coumadin ; d. 12/2019 Amio d/c'd 2/2 hyperthyroidism.   Poorly controlled diabetes mellitus (HCC)    RBBB    Sleep apnea    uses nightly    HEMATOLOGY/ONCOLOGY HISTORY:  Oncology History   Glioblastoma (HCC)  01/29/2024 Surgery   Left temporal craniotomy, resection with Dr. Rosslyn; path is Grade 4 glioma, IDH pending   03/13/2024 -  Chemotherapy   Patient is on Treatment Plan : BRAIN GLIOBLASTOMA Radiation Therapy With Concurrent Temozolomide  75 mg/m2 Daily Followed By Sequential Maintenance Temozolomide  x 6-12 cycles       ALLERGIES:  has no known allergies.  MEDICATIONS:  Current Outpatient Medications  Medication Sig Dispense Refill   acetaminophen  (TYLENOL ) 500 MG tablet Take 500 mg by mouth 4 (four) times daily as needed for mild pain or moderate pain.     atorvastatin  (LIPITOR ) 80 MG tablet Take 80 mg by mouth at bedtime.     benazepril  (LOTENSIN ) 20 MG tablet Take 1 tablet (20 mg total) by mouth daily.     cholecalciferol  (VITAMIN D3) 25 MCG (1000 UNIT) tablet Take 500 Units by mouth daily.     Continuous Blood Gluc Sensor (FREESTYLE LIBRE 14 DAY SENSOR) MISC SMARTSIG:1 Kit(s) Topical Every 2 Weeks     dexamethasone  (DECADRON ) 2 MG tablet Take 1 tablet (2 mg total) by mouth daily. 30 tablet 0   dofetilide  (TIKOSYN ) 500 MCG capsule TAKE 1 CAPSULE BY MOUTH TWICE DAILY 180 capsule 2   escitalopram  (LEXAPRO ) 5 MG tablet Take 1 tablet (5 mg total) by mouth daily. (Patient not taking: Reported on 04/02/2024) 30 tablet 0   ezetimibe  (ZETIA ) 10 MG tablet TAKE 1 TABLET BY MOUTH ONCE DAILY 90 tablet 3   [Paused] furosemide  (LASIX ) 20 MG tablet TAKE 1 TABLET BY MOUTH  TWICE DAILY AS NEEDED (Patient taking differently: Take 20 mg by mouth daily.) 180 tablet 0   insulin  glargine (LANTUS ) 100 UNIT/ML injection Inject 0.46 mLs (46 Units total) into the skin 2 (two) times daily.     insulin  lispro (HUMALOG ) 100 UNIT/ML KiwkPen Inject 0.1 mLs (10 Units total) into the skin 3 (three) times daily. Sliding Scale     INSULIN  SYRINGE 1CC/29G 29G X 1/2 1 ML MISC USE AS DIRECTED 100 each PRN   JARDIANCE  25 MG TABS tablet Take 12.5 mg by mouth daily.     levETIRAcetam  (KEPPRA ) 500 MG  tablet Take 1 tablet (500 mg total) by mouth 2 (two) times daily. 60 tablet 0   LORazepam  (ATIVAN ) 0.5 MG tablet Take 1 tablet (0.5 mg total) by mouth daily after supper. May take an additional 1 tablet (0.5 mg total) by mouth daily as needed for anxiety. (Patient not taking: Reported on 04/02/2024) 25 tablet 0   magnesium  oxide (MAG-OX) 400 (240 Mg) MG tablet Take 1 tablet (400 mg total) by mouth daily. 30 tablet 0   metoprolol  succinate (TOPROL -XL) 25 MG 24 hr tablet TAKE 1 TABLET BY MOUTH ONCE DAILY 90 tablet 1   nitroGLYCERIN  (NITROSTAT ) 0.4 MG SL tablet Place 1 tablet (0.4 mg total) under the tongue every 5 (five) minutes as needed for chest pain. 25 tablet 1   ondansetron  (ZOFRAN ) 8 MG tablet Take 1 tablet (8 mg total) by mouth every 8 (eight) hours as needed for nausea or vomiting. 20 tablet 1   ONE TOUCH ULTRA TEST test strip TEST THREE TIMES A DAY AS DIRECTED. 100 each 12   pantoprazole  (PROTONIX ) 40 MG tablet Take 1 tablet (40 mg total) by mouth daily. Stop taking if no longer on decadron . 30 tablet 3   rivaroxaban  (XARELTO ) 20 MG TABS tablet Take 1 tablet (20 mg total) by mouth daily with supper. 30 tablet 3   temozolomide  (TEMODAR ) 140 MG capsule Take 1 capsule (140 mg total) by mouth daily. May take on an empty stomach to decrease nausea & vomiting. 42 capsule 0   temozolomide  (TEMODAR ) 20 MG capsule Take 1 capsule (20 mg total) by mouth daily. May take on an empty stomach to decrease nausea & vomiting. 42 capsule 0   No current facility-administered medications for this visit.   Facility-Administered Medications Ordered in Other Visits  Medication Dose Route Frequency Provider Last Rate Last Admin   sodium chloride  flush (NS) 0.9 % injection 3 mL  3 mL Intravenous Q12H Visser, Jacquelyn D, PA-C        VITAL SIGNS: BP 115/68   Pulse (!) 52   Temp (!) 97.5 F (36.4 C)  There were no vitals filed for this visit.  Estimated body mass index is 32.47 kg/m as calculated from the  following:   Height as of 04/02/24: 5' 11 (1.803 m).   Weight as of 03/26/24: 105.6 kg (232 lb 12.9 oz).  LABS: CBC:    Component Value Date/Time   WBC 15.2 (H) 03/26/2024 0426   HGB 11.6 (L) 03/26/2024 0426   HGB 13.8 06/28/2023 0946   HCT 34.2 (L) 03/26/2024 0426   HCT 41.7 06/28/2023 0946   PLT 358 03/26/2024 0426   PLT 232 06/28/2023 0946   MCV 92.2 03/26/2024 0426   MCV 93 06/28/2023 0946   MCV 95 03/02/2014 1416   NEUTROABS 10.4 (H) 03/26/2024 0426   NEUTROABS 5.5 10/26/2016 1423   NEUTROABS 10.1 (H) 03/02/2014 1416   LYMPHSABS 3.4 03/26/2024  0426   LYMPHSABS 2.0 10/26/2016 1423   LYMPHSABS 1.5 03/02/2014 1416   MONOABS 1.1 (H) 03/26/2024 0426   MONOABS 1.3 (H) 03/02/2014 1416   EOSABS 0.1 03/26/2024 0426   EOSABS 0.3 10/26/2016 1423   EOSABS 0.3 03/02/2014 1416   BASOSABS 0.1 03/26/2024 0426   BASOSABS 0.0 10/26/2016 1423   BASOSABS 0.1 03/02/2014 1416   Comprehensive Metabolic Panel:    Component Value Date/Time   NA 133 (L) 03/26/2024 0426   NA 140 06/28/2023 0946   NA 131 (L) 03/02/2014 1416   K 4.6 03/26/2024 0426   K 4.1 03/02/2014 1416   CL 100 03/26/2024 0426   CL 98 03/02/2014 1416   CO2 23 03/26/2024 0426   CO2 24 03/02/2014 1416   BUN 30 (H) 03/26/2024 0426   BUN 26 06/28/2023 0946   BUN 18 03/02/2014 1416   CREATININE 1.02 03/26/2024 0426   CREATININE 1.27 03/02/2014 1416   GLUCOSE 152 (H) 03/26/2024 0426   GLUCOSE 374 (H) 03/02/2014 1416   CALCIUM  8.7 (L) 03/26/2024 0426   CALCIUM  8.7 03/02/2014 1416   AST 23 03/16/2024 0547   AST 30 03/02/2014 1416   ALT 24 03/16/2024 0547   ALT 45 03/02/2014 1416   ALKPHOS 92 03/16/2024 0547   ALKPHOS 140 (H) 03/02/2014 1416   BILITOT 0.7 03/16/2024 0547   BILITOT 0.9 02/20/2020 0925   BILITOT 0.6 03/02/2014 1416   PROT 6.2 (L) 03/16/2024 0547   PROT 7.0 02/20/2020 0925   PROT 7.8 03/02/2014 1416   ALBUMIN  2.5 (L) 03/16/2024 0547   ALBUMIN  3.9 02/20/2020 0925   ALBUMIN  3.1 (L) 03/02/2014 1416      Present during today's visit: Patient and his wife  Start plan: Patient will start radiation on 04/22/2024, but begin his temozolomide  the evening before on 04/21/2024   Patient Education I spoke with patient for overview of new oral chemotherapy medication: Temozolomide   Treatment goal: Control  Administration: Counseled patient on administration, dosing, side effects, monitoring, drug-food interactions, safe handling, storage, and disposal. Patient will take one 140 mg capsule and one 20mg  capsule (total dose 160mg ) by mouth daily. May take on an empty stomach to decrease nausea & vomiting.  Side Effects: Side effects include but not limited to: Nausea, constipation, fatigue, hair thinning, decreased WBC/PLT.    Drug-drug Interactions (DDI): No current temozolomide  ddIs  Adherence: After discussion with patient no patient barriers to medication adherence identified.  Reviewed with patient importance of keeping a medication schedule and plan for any missed doses.  Communication and Learning Assessment Primary learner: Patient and his wife Barriers to learning: No barriers Preferred language: English Learning preferences: Listening Reading  [employer] [place] voiced understanding and appreciation. All questions answered. Medication handout provided.  Provided patient with Oral Chemotherapy Navigation Clinic phone number. Patient knows to call the office with questions or concerns. Oral Chemotherapy Navigation Clinic will continue to follow.  Patient expressed understanding and was in agreement with this plan. He also understands that He can call clinic at any time with any questions, concerns, or complaints.   Medication Access Issues: No issue patient has medication in hand from [facility]  Follow-up plan: Return to clinic as scheduled  Thank you for allowing me to participate in the care of this patient.   Time Total: 20 minutes  Visit consisted  of counseling and education on dealing with issues of symptom management in the setting of serious and potentially life-threatening illness.Greater than 50%  of this  time was spent counseling and coordinating care related to the above assessment and plan.  Signed by: Adasia Hoar N. Mariel Gaudin, PharmD, NEILA, CPP Hematology/Oncology Clinical Pharmacist Practitioner [place] [phone]  04/09/2024 10:43 AM  "

## 2024-04-09 NOTE — Progress Notes (Signed)
 " POST OPERATIVE OFFICE NOTE    CC:  F/u for surgery  HPI:  This is a 69 y.o. male who is s/p left above-the-knee amputation by Dr. Magda on 03/07/2024.  Prior to this he presented with an ischemic left lower extremity and underwent extensive femoral embolectomy of the left leg with fasciotomies.  Due to ongoing rest pain and ischemic foot he required amputation.  He is here today with his wife and his son.  He believes his amputation is healing well.  He is on Xarelto  daily.  Allergies[1]  Current Outpatient Medications  Medication Sig Dispense Refill   acetaminophen  (TYLENOL ) 500 MG tablet Take 500 mg by mouth 4 (four) times daily as needed for mild pain or moderate pain.     atorvastatin  (LIPITOR ) 80 MG tablet Take 80 mg by mouth at bedtime.     benazepril  (LOTENSIN ) 20 MG tablet Take 1 tablet (20 mg total) by mouth daily.     cholecalciferol  (VITAMIN D3) 25 MCG (1000 UNIT) tablet Take 500 Units by mouth daily.     Continuous Blood Gluc Sensor (FREESTYLE LIBRE 14 DAY SENSOR) MISC SMARTSIG:1 Kit(s) Topical Every 2 Weeks     dexamethasone  (DECADRON ) 2 MG tablet Take 1 tablet (2 mg total) by mouth daily. 30 tablet 0   dofetilide  (TIKOSYN ) 500 MCG capsule TAKE 1 CAPSULE BY MOUTH TWICE DAILY 180 capsule 2   ezetimibe  (ZETIA ) 10 MG tablet TAKE 1 TABLET BY MOUTH ONCE DAILY 90 tablet 3   [Paused] furosemide  (LASIX ) 20 MG tablet TAKE 1 TABLET BY MOUTH TWICE DAILY AS NEEDED (Patient taking differently: Take 20 mg by mouth daily.) 180 tablet 0   insulin  glargine (LANTUS ) 100 UNIT/ML injection Inject 0.46 mLs (46 Units total) into the skin 2 (two) times daily.     insulin  lispro (HUMALOG ) 100 UNIT/ML KiwkPen Inject 0.1 mLs (10 Units total) into the skin 3 (three) times daily. Sliding Scale     INSULIN  SYRINGE 1CC/29G 29G X 1/2 1 ML MISC USE AS DIRECTED 100 each PRN   JARDIANCE  25 MG TABS tablet Take 12.5 mg by mouth daily.     levETIRAcetam  (KEPPRA ) 500 MG tablet Take 1 tablet (500 mg total) by  mouth 2 (two) times daily. 60 tablet 0   magnesium  oxide (MAG-OX) 400 (240 Mg) MG tablet Take 1 tablet (400 mg total) by mouth daily. 30 tablet 0   metoprolol  succinate (TOPROL -XL) 25 MG 24 hr tablet TAKE 1 TABLET BY MOUTH ONCE DAILY 90 tablet 1   nitroGLYCERIN  (NITROSTAT ) 0.4 MG SL tablet Place 1 tablet (0.4 mg total) under the tongue every 5 (five) minutes as needed for chest pain. 25 tablet 1   ondansetron  (ZOFRAN ) 8 MG tablet Take 1 tablet (8 mg total) by mouth every 8 (eight) hours as needed for nausea or vomiting. 20 tablet 1   ONE TOUCH ULTRA TEST test strip TEST THREE TIMES A DAY AS DIRECTED. 100 each 12   pantoprazole  (PROTONIX ) 40 MG tablet Take 1 tablet (40 mg total) by mouth daily. Stop taking if no longer on decadron . 30 tablet 3   rivaroxaban  (XARELTO ) 20 MG TABS tablet Take 1 tablet (20 mg total) by mouth daily with supper. 30 tablet 3   temozolomide  (TEMODAR ) 140 MG capsule Take 1 capsule (140 mg total) by mouth daily. May take on an empty stomach to decrease nausea & vomiting. 42 capsule 0   temozolomide  (TEMODAR ) 20 MG capsule Take 1 capsule (20 mg total) by mouth daily. May take  on an empty stomach to decrease nausea & vomiting. 42 capsule 0   escitalopram  (LEXAPRO ) 5 MG tablet Take 1 tablet (5 mg total) by mouth daily. (Patient not taking: Reported on 04/09/2024) 30 tablet 0   LORazepam  (ATIVAN ) 0.5 MG tablet Take 1 tablet (0.5 mg total) by mouth daily after supper. May take an additional 1 tablet (0.5 mg total) by mouth daily as needed for anxiety. (Patient not taking: Reported on 04/09/2024) 25 tablet 0   No current facility-administered medications for this visit.   Facility-Administered Medications Ordered in Other Visits  Medication Dose Route Frequency Provider Last Rate Last Admin   sodium chloride  flush (NS) 0.9 % injection 3 mL  3 mL Intravenous Q12H Visser, Jacquelyn D, PA-C         ROS:  See HPI  Physical Exam:  Vitals:   04/09/24 1338  BP: 135/70  Pulse: (!) 59   Weight: 233 lb (105.7 kg)  Height: 5' 11 (1.803 m)    Incision: Left AKA well-appearing Extremities: Palpable right DP pulse; right lower extremity edema to the level of the mid shin  Assessment/Plan:  This is a 69 y.o. male who is s/p: Left above-the-knee amputation  Left AKA appears to be healing really well.  The every other staple was removed in the clinic today.  He will return in 1 to 2 weeks as a nurse visit for removal of the remainder of staples.  At that time we will also provide him with a prescription to begin the process of obtaining a prosthetic limb.  Reassured the family and the patient that there is no evidence of arterial insufficiency in the right lower extremity.  He has a palpable DP pulse.  He should focus on leg elevation and use of a compression sock to help manage edema.  The patient has a left Above Knee Amputation. The patient is well motivated to return to their prior functional status by utilizing a prosthesis to perform ADL's and maintain a healthy lifestyle. The patient has the physical and cognitive capacity to function with a prosthesis.   Functional Level: K2 Limited Community Ambulator: Has the ability or potential for ambulation and to traverse low environmental barriers such as curbs, stairs or uneven surfaces  Residual Limb History: The skin condition of the residual limb is good. The patient will continue to monitor the skin of the residual limb and follow hygiene instructions.  The patient is experiencing no pain related to amputation  Prosthetic Prescription Plan: Counseling and education regarding prosthetic management will be provided to the patient via a certified prosthetist. A multi-discipline team, including physical therapy, will manage the prosthetic fabrication, fitting and prosthetic gait training.     Donnice Sender, PA-C [facility] [phone]  Clinic MD:  Magda     [1] No Known Allergies  "

## 2024-04-11 ENCOUNTER — Telehealth: Payer: Self-pay

## 2024-04-11 ENCOUNTER — Ambulatory Visit: Admitting: Speech Pathology

## 2024-04-11 NOTE — Telephone Encounter (Signed)
 Received CMN from [facility] for CPAP supplies. CMN not signed, as pt has not been seen in over one year.

## 2024-04-12 ENCOUNTER — Other Ambulatory Visit: Payer: Self-pay | Admitting: *Deleted

## 2024-04-12 DIAGNOSIS — C719 Malignant neoplasm of brain, unspecified: Secondary | ICD-10-CM

## 2024-04-15 ENCOUNTER — Ambulatory Visit
Admission: RE | Admit: 2024-04-15 | Discharge: 2024-04-15 | Disposition: A | Source: Ambulatory Visit | Attending: Radiation Oncology | Admitting: Radiation Oncology

## 2024-04-15 ENCOUNTER — Other Ambulatory Visit: Payer: Self-pay | Admitting: Cardiology

## 2024-04-15 ENCOUNTER — Other Ambulatory Visit (HOSPITAL_COMMUNITY): Payer: Self-pay

## 2024-04-15 MED ORDER — DOFETILIDE 500 MCG PO CAPS
500.0000 ug | ORAL_CAPSULE | Freq: Two times a day (BID) | ORAL | 2 refills | Status: AC
  Filled 2024-04-15: qty 180, 90d supply, fill #0

## 2024-04-16 ENCOUNTER — Ambulatory Visit: Admitting: Speech Pathology

## 2024-04-17 ENCOUNTER — Ambulatory Visit: Attending: Vascular Surgery

## 2024-04-17 ENCOUNTER — Encounter: Payer: Self-pay | Admitting: Physician Assistant

## 2024-04-17 NOTE — Progress Notes (Signed)
 Patients left leg AKA staples removed.  [facility] order for prosthesis faxed to [facility] in [place].

## 2024-04-18 ENCOUNTER — Ambulatory Visit
Admission: RE | Admit: 2024-04-18 | Discharge: 2024-04-18 | Disposition: A | Source: Ambulatory Visit | Attending: Radiation Oncology | Admitting: Radiation Oncology

## 2024-04-18 ENCOUNTER — Ambulatory Visit: Admitting: Speech Pathology

## 2024-04-20 ENCOUNTER — Other Ambulatory Visit: Payer: Self-pay

## 2024-04-22 ENCOUNTER — Other Ambulatory Visit: Payer: Self-pay

## 2024-04-22 ENCOUNTER — Ambulatory Visit
Admission: RE | Admit: 2024-04-22 | Discharge: 2024-04-22 | Disposition: A | Source: Ambulatory Visit | Attending: Radiation Oncology | Admitting: Radiation Oncology

## 2024-04-22 LAB — RAD ONC ARIA SESSION SUMMARY
Course Elapsed Days: 0
Plan Fractions Treated to Date: 1
Plan Prescribed Dose Per Fraction: 2 Gy
Plan Total Fractions Prescribed: 30
Plan Total Prescribed Dose: 60 Gy
Reference Point Dosage Given to Date: 2 Gy
Reference Point Session Dosage Given: 2 Gy
Session Number: 1

## 2024-04-23 ENCOUNTER — Other Ambulatory Visit: Payer: Self-pay

## 2024-04-23 ENCOUNTER — Ambulatory Visit: Admitting: Speech Pathology

## 2024-04-23 ENCOUNTER — Ambulatory Visit
Admission: RE | Admit: 2024-04-23 | Discharge: 2024-04-23 | Disposition: A | Source: Ambulatory Visit | Attending: Radiation Oncology | Admitting: Radiation Oncology

## 2024-04-23 ENCOUNTER — Telehealth: Payer: Self-pay | Admitting: Cardiovascular Disease

## 2024-04-23 ENCOUNTER — Telehealth: Payer: Self-pay | Admitting: Neurosurgery

## 2024-04-23 DIAGNOSIS — G9389 Other specified disorders of brain: Secondary | ICD-10-CM

## 2024-04-23 LAB — RAD ONC ARIA SESSION SUMMARY
Course Elapsed Days: 1
Plan Fractions Treated to Date: 2
Plan Prescribed Dose Per Fraction: 2 Gy
Plan Total Fractions Prescribed: 30
Plan Total Prescribed Dose: 60 Gy
Reference Point Dosage Given to Date: 4 Gy
Reference Point Session Dosage Given: 2 Gy
Session Number: 2

## 2024-04-23 NOTE — Telephone Encounter (Signed)
 Pt spouse wanted Dr. Gollan to know that he had a tumor on his brain and ear and had his leg amputated and is going through therapy now. That is why his appt is out till April.

## 2024-04-23 NOTE — Telephone Encounter (Signed)
 Patient's wife called wanting to know if patient should continue with keppra , she would like a call back at your earliest convenience.

## 2024-04-24 ENCOUNTER — Other Ambulatory Visit: Payer: Self-pay | Admitting: Neurosurgery

## 2024-04-24 ENCOUNTER — Other Ambulatory Visit: Payer: Self-pay

## 2024-04-24 ENCOUNTER — Ambulatory Visit
Admission: RE | Admit: 2024-04-24 | Discharge: 2024-04-24 | Disposition: A | Source: Ambulatory Visit | Attending: Radiation Oncology | Admitting: Radiation Oncology

## 2024-04-24 LAB — RAD ONC ARIA SESSION SUMMARY
Course Elapsed Days: 2
Plan Fractions Treated to Date: 3
Plan Prescribed Dose Per Fraction: 2 Gy
Plan Total Fractions Prescribed: 30
Plan Total Prescribed Dose: 60 Gy
Reference Point Dosage Given to Date: 6 Gy
Reference Point Session Dosage Given: 2 Gy
Session Number: 3

## 2024-04-25 ENCOUNTER — Ambulatory Visit: Admitting: Speech Pathology

## 2024-04-25 ENCOUNTER — Other Ambulatory Visit: Payer: Self-pay

## 2024-04-25 ENCOUNTER — Ambulatory Visit
Admission: RE | Admit: 2024-04-25 | Discharge: 2024-04-25 | Disposition: A | Discharge status: Home / Self Care | Source: Ambulatory Visit | Attending: Radiation Oncology | Admitting: Radiation Oncology

## 2024-04-25 ENCOUNTER — Inpatient Hospital Stay

## 2024-04-25 LAB — RAD ONC ARIA SESSION SUMMARY
Course Elapsed Days: 3
Plan Fractions Treated to Date: 4
Plan Prescribed Dose Per Fraction: 2 Gy
Plan Total Fractions Prescribed: 30
Plan Total Prescribed Dose: 60 Gy
Reference Point Dosage Given to Date: 8 Gy
Reference Point Session Dosage Given: 2 Gy
Session Number: 4

## 2024-04-25 NOTE — Telephone Encounter (Signed)
 Called patient's wife back. Notified them that the medication was refilled and that I sent the referral to Dr. Lane for evaulation and medication management for Seizures.

## 2024-04-26 ENCOUNTER — Other Ambulatory Visit: Payer: Self-pay

## 2024-04-26 ENCOUNTER — Inpatient Hospital Stay

## 2024-04-26 ENCOUNTER — Ambulatory Visit
Admission: RE | Admit: 2024-04-26 | Discharge: 2024-04-26 | Disposition: A | Source: Ambulatory Visit | Attending: Radiation Oncology | Admitting: Radiation Oncology

## 2024-04-26 DIAGNOSIS — C719 Malignant neoplasm of brain, unspecified: Secondary | ICD-10-CM

## 2024-04-26 LAB — RAD ONC ARIA SESSION SUMMARY
Course Elapsed Days: 4
Plan Fractions Treated to Date: 5
Plan Prescribed Dose Per Fraction: 2 Gy
Plan Total Fractions Prescribed: 30
Plan Total Prescribed Dose: 60 Gy
Reference Point Dosage Given to Date: 10 Gy
Reference Point Session Dosage Given: 2 Gy
Session Number: 5

## 2024-04-26 LAB — CBC (CANCER CENTER ONLY)
HCT: 40.7 % (ref 39.0–52.0)
Hemoglobin: 13.4 g/dL (ref 13.0–17.0)
MCH: 30.9 pg (ref 26.0–34.0)
MCHC: 32.9 g/dL (ref 30.0–36.0)
MCV: 93.8 fL (ref 80.0–100.0)
Platelet Count: 213 K/uL (ref 150–400)
RBC: 4.34 MIL/uL (ref 4.22–5.81)
RDW: 14.9 % (ref 11.5–15.5)
WBC Count: 10.4 K/uL (ref 4.0–10.5)
nRBC: 0 % (ref 0.0–0.2)

## 2024-04-26 NOTE — Telephone Encounter (Signed)
 Duplicate

## 2024-04-26 NOTE — Telephone Encounter (Signed)
 Medication was filled yesterday by Sheppard SQUIBB NP.

## 2024-04-29 ENCOUNTER — Ambulatory Visit

## 2024-04-30 ENCOUNTER — Ambulatory Visit

## 2024-04-30 ENCOUNTER — Ambulatory Visit: Admitting: Speech Pathology

## 2024-05-01 ENCOUNTER — Ambulatory Visit

## 2024-05-02 ENCOUNTER — Ambulatory Visit
Admission: RE | Admit: 2024-05-02 | Discharge: 2024-05-02 | Disposition: A | Discharge status: Home / Self Care | Source: Ambulatory Visit | Attending: Radiation Oncology | Admitting: Radiation Oncology

## 2024-05-02 ENCOUNTER — Other Ambulatory Visit: Payer: Self-pay

## 2024-05-02 ENCOUNTER — Ambulatory Visit: Admitting: Speech Pathology

## 2024-05-02 ENCOUNTER — Inpatient Hospital Stay

## 2024-05-02 LAB — RAD ONC ARIA SESSION SUMMARY
Course Elapsed Days: 10
Plan Fractions Treated to Date: 6
Plan Prescribed Dose Per Fraction: 2 Gy
Plan Total Fractions Prescribed: 30
Plan Total Prescribed Dose: 60 Gy
Reference Point Dosage Given to Date: 12 Gy
Reference Point Session Dosage Given: 2 Gy
Session Number: 6

## 2024-05-03 ENCOUNTER — Inpatient Hospital Stay: Admitting: Internal Medicine

## 2024-05-03 ENCOUNTER — Encounter: Payer: Self-pay | Admitting: Internal Medicine

## 2024-05-03 ENCOUNTER — Other Ambulatory Visit: Payer: Self-pay

## 2024-05-03 ENCOUNTER — Ambulatory Visit
Admission: RE | Admit: 2024-05-03 | Discharge: 2024-05-03 | Disposition: A | Source: Ambulatory Visit | Attending: Radiation Oncology | Admitting: Radiation Oncology

## 2024-05-03 ENCOUNTER — Inpatient Hospital Stay

## 2024-05-03 VITALS — BP 136/62 | HR 77 | Temp 97.6°F | Resp 20

## 2024-05-03 DIAGNOSIS — C719 Malignant neoplasm of brain, unspecified: Secondary | ICD-10-CM | POA: Diagnosis not present

## 2024-05-03 DIAGNOSIS — R569 Unspecified convulsions: Secondary | ICD-10-CM | POA: Diagnosis not present

## 2024-05-03 LAB — CMP (CANCER CENTER ONLY)
ALT: 62 U/L — ABNORMAL HIGH (ref 0–44)
AST: 37 U/L (ref 15–41)
Albumin: 3.4 g/dL — ABNORMAL LOW (ref 3.5–5.0)
Alkaline Phosphatase: 133 U/L — ABNORMAL HIGH (ref 38–126)
Anion gap: 12 (ref 5–15)
BUN: 41 mg/dL — ABNORMAL HIGH (ref 8–23)
CO2: 19 mmol/L — ABNORMAL LOW (ref 22–32)
Calcium: 8.5 mg/dL — ABNORMAL LOW (ref 8.9–10.3)
Chloride: 100 mmol/L (ref 98–111)
Creatinine: 1.26 mg/dL — ABNORMAL HIGH (ref 0.61–1.24)
GFR, Estimated: >60 mL/min
Glucose, Bld: 449 mg/dL — ABNORMAL HIGH (ref 70–99)
Potassium: 4.7 mmol/L (ref 3.5–5.1)
Sodium: 131 mmol/L — ABNORMAL LOW (ref 135–145)
Total Bilirubin: 0.8 mg/dL (ref 0.0–1.2)
Total Protein: 6 g/dL — ABNORMAL LOW (ref 6.5–8.1)

## 2024-05-03 LAB — CBC WITH DIFFERENTIAL (CANCER CENTER ONLY)
Abs Immature Granulocytes: 0.09 10*3/uL — ABNORMAL HIGH (ref 0.00–0.07)
Basophils Absolute: 0 10*3/uL (ref 0.0–0.1)
Basophils Relative: 0 %
Eosinophils Absolute: 0 10*3/uL (ref 0.0–0.5)
Eosinophils Relative: 0 %
HCT: 40.7 % (ref 39.0–52.0)
Hemoglobin: 13.6 g/dL (ref 13.0–17.0)
Immature Granulocytes: 2 %
Lymphocytes Relative: 12 %
Lymphs Abs: 0.7 10*3/uL (ref 0.7–4.0)
MCH: 31.1 pg (ref 26.0–34.0)
MCHC: 33.4 g/dL (ref 30.0–36.0)
MCV: 92.9 fL (ref 80.0–100.0)
Monocytes Absolute: 0.5 10*3/uL (ref 0.1–1.0)
Monocytes Relative: 9 %
Neutro Abs: 4.8 10*3/uL (ref 1.7–7.7)
Neutrophils Relative %: 77 %
Platelet Count: 172 10*3/uL (ref 150–400)
RBC: 4.38 MIL/uL (ref 4.22–5.81)
RDW: 15.2 % (ref 11.5–15.5)
WBC Count: 6.2 10*3/uL (ref 4.0–10.5)
nRBC: 0 % (ref 0.0–0.2)

## 2024-05-03 LAB — RAD ONC ARIA SESSION SUMMARY
Course Elapsed Days: 11
Plan Fractions Treated to Date: 7
Plan Prescribed Dose Per Fraction: 2 Gy
Plan Total Fractions Prescribed: 30
Plan Total Prescribed Dose: 60 Gy
Reference Point Dosage Given to Date: 14 Gy
Reference Point Session Dosage Given: 2 Gy
Session Number: 7

## 2024-05-03 MED ORDER — DEXAMETHASONE 1 MG PO TABS: ORAL_TABLET | ORAL | 0 refills | Status: AC

## 2024-05-03 NOTE — Progress Notes (Signed)
 Patient states his right leg is swollen. Patient states both hands cramp at times.

## 2024-05-03 NOTE — Progress Notes (Signed)
 "  [facility] at [facility] [address]  [place], [place] [address] [phone]   Interval Evaluation  Date of Service: 05/03/24 Patient Name: Douglas Hunter Patient MRN: [id number] Patient DOB: 07-Feb-1956 Provider: Arthea MARLA Manns, MD  Identifying Statement:  Douglas Hunter is a 69 y.o. male with left temporal glioblastoma   Oncologic History: Oncology History  Glioblastoma (HCC)  01/29/2024 Surgery   Left temporal craniotomy, resection with Dr. Rosslyn; path is Grade 4 glioma, IDH pending   03/13/2024 -  Chemotherapy   Patient is on Treatment Plan : BRAIN GLIOBLASTOMA Radiation Therapy With Concurrent Temozolomide  75 mg/m2 Daily Followed By Sequential Maintenance Temozolomide  x 6-12 cycles       Biomarkers:  MGMT Methylated.  IDH 1/2 Wild type.  EGFR Unknown  TERT Unknown   Interval History: Douglas Hunter presents today having completed 2 weeks of radiation and Temodar .  He is tolerating treatment well overall with some modest fatigue reported.  He does describe worsening swelling in his left lower leg, without pain or redness.  Overall no major problem following leg amputation, although he does describe phantom sensations.  He will be evaluated for possible prosthetics upcoming with vascular surgery.  H+P (02/08/24) Patient presented to neurologic attention on 10/22 with episode of speech arrest.  Family notes that he was in normal state of health prior to the deficit, and that the onset was sudden.  He was evaluated for stroke in the ED, but CNS imaging demonstrated a left temporal mass c/w primary brain tumor.  He underwent craniotomy, resection on 01/29/24 with Dr. Rosslyn; path demonstrated grade 4 glioma.  Following surgery he had some difficulty with speech, but this is greatly improved at present.  He is walking on his own without issue, independent with activities of daily living.  Dosing decadron  4mg  twice per day, Keppra  500mg  twice per  day.  Medications: Current Outpatient Medications on File Prior to Visit  Medication Sig Dispense Refill   acetaminophen  (TYLENOL ) 500 MG tablet Take 500 mg by mouth 4 (four) times daily as needed for mild pain or moderate pain.     atorvastatin  (LIPITOR ) 80 MG tablet Take 80 mg by mouth at bedtime.     benazepril  (LOTENSIN ) 20 MG tablet Take 1 tablet (20 mg total) by mouth daily.     cholecalciferol  (VITAMIN D3) 25 MCG (1000 UNIT) tablet Take 500 Units by mouth daily.     Continuous Blood Gluc Sensor (FREESTYLE LIBRE 14 DAY SENSOR) MISC SMARTSIG:1 Kit(s) Topical Every 2 Weeks     dofetilide  (TIKOSYN ) 500 MCG capsule Take 1 capsule (500 mcg total) by mouth 2 (two) times daily. 180 capsule 2   ezetimibe  (ZETIA ) 10 MG tablet TAKE 1 TABLET BY MOUTH ONCE DAILY 90 tablet 3   [Paused] furosemide  (LASIX ) 20 MG tablet TAKE 1 TABLET BY MOUTH TWICE DAILY AS NEEDED 180 tablet 0   insulin  glargine (LANTUS ) 100 UNIT/ML injection Inject 0.46 mLs (46 Units total) into the skin 2 (two) times daily.     insulin  lispro (HUMALOG ) 100 UNIT/ML KiwkPen Inject 0.1 mLs (10 Units total) into the skin 3 (three) times daily. Sliding Scale     INSULIN  SYRINGE 1CC/29G 29G X 1/2 1 ML MISC USE AS DIRECTED 100 each PRN   JARDIANCE  25 MG TABS tablet Take 12.5 mg by mouth daily.     levETIRAcetam  (KEPPRA ) 500 MG tablet TAKE 1 TABLET BY MOUTH TWICE DAILY 60 tablet 0   magnesium  oxide (  MAG-OX) 400 (240 Mg) MG tablet Take 1 tablet (400 mg total) by mouth daily. 30 tablet 0   metoprolol  succinate (TOPROL -XL) 25 MG 24 hr tablet TAKE 1 TABLET BY MOUTH ONCE DAILY 90 tablet 1   nitroGLYCERIN  (NITROSTAT ) 0.4 MG SL tablet Place 1 tablet (0.4 mg total) under the tongue every 5 (five) minutes as needed for chest pain. 25 tablet 1   ondansetron  (ZOFRAN ) 8 MG tablet Take 1 tablet (8 mg total) by mouth every 8 (eight) hours as needed for nausea or vomiting. 20 tablet 1   ONE TOUCH ULTRA TEST test strip TEST THREE TIMES A DAY AS DIRECTED. 100  each 12   pantoprazole  (PROTONIX ) 40 MG tablet Take 1 tablet (40 mg total) by mouth daily. Stop taking if no longer on decadron . 30 tablet 3   rivaroxaban  (XARELTO ) 20 MG TABS tablet Take 1 tablet (20 mg total) by mouth daily with supper. 30 tablet 3   temozolomide  (TEMODAR ) 140 MG capsule Take 1 capsule (140 mg total) by mouth daily. May take on an empty stomach to decrease nausea & vomiting. 42 capsule 0   temozolomide  (TEMODAR ) 20 MG capsule Take 1 capsule (20 mg total) by mouth daily. May take on an empty stomach to decrease nausea & vomiting. 42 capsule 0   LORazepam  (ATIVAN ) 0.5 MG tablet Take 1 tablet (0.5 mg total) by mouth daily after supper. May take an additional 1 tablet (0.5 mg total) by mouth daily as needed for anxiety. (Patient not taking: Reported on 05/03/2024) 25 tablet 0   Current Facility-Administered Medications on File Prior to Visit  Medication Dose Route Frequency Provider Last Rate Last Admin   sodium chloride  flush (NS) 0.9 % injection 3 mL  3 mL Intravenous Q12H Visser, Jacquelyn D, PA-C        Allergies: No Known Allergies Past Medical History:  Past Medical History:  Diagnosis Date   AKI (acute kidney injury) 03/13/2020   Atrial flutter (HCC)    Coronary artery disease 08/2011   a. NSTEMI 08/2011, LHC w/ severe disease of LCx with good collaterals, lesion was high-risk for intervention due to a steep angulation of the LCx coming from the left main coronary artery as well as heavy calcifications, discused at cath conference w/ consensus advising med Rx; b. 03/2020 Cath: LM nl, LAD 35m, LCX 100p w/ R->L and L->L collats. RCA dominant, nl. EF 55%. PA 33/6/19, PCWP 8.   GERD (gastroesophageal reflux disease)    H/O brain tumor 01/2024   History of echocardiogram    a. 01/2020 Echo: EF 60-65%, no rwma. Sev elev PASP. Mod dil LA.   Hyperlipidemia    Hypertension    Myocardial infarction [facility])    Paroxysmal atrial fibrillation (HCC)    a. s/p ablation 01/2013; b  CHADS2VASc => 3 (HTN, DM, vascular disease); c. on Coumadin ; d. 12/2019 Amio d/c'd 2/2 hyperthyroidism.   Poorly controlled diabetes mellitus (HCC)    RBBB    Sleep apnea    uses nightly   Past Surgical History:  Past Surgical History:  Procedure Laterality Date   ABLATION  01/29/2013   PVI and CTI by Dr Kelsie for atrial flutter and paroxysmal atrial fibrillation   AMPUTATION Left 03/07/2024   Procedure: AMPUTATION, ABOVE KNEE;  Surgeon: Magda Debby SAILOR, MD;  Location: [facility] OR;  Service: Vascular;  Laterality: Left;   ANTERIOR CERVICAL DECOMP/DISCECTOMY FUSION N/A 04/18/2018   Procedure: ANTERIOR CERVICAL DECOMPRESSION/DISCECTOMY FUSION 2 LEVELS C5-7;  Surgeon: Clois Fret, MD;  Location: [facility] ORS;  Service: Neurosurgery;  Laterality: N/A;   APPLICATION OF CRANIAL NAVIGATION Left 01/29/2024   Procedure: COMPUTER-ASSISTED NAVIGATION, FOR CRANIAL PROCEDURE;  Surgeon: Rosslyn Dino HERO, MD;  Location: [facility] OR;  Service: Neurosurgery;  Laterality: Left;   APPLICATION OF WOUND VAC Left 03/07/2024   Procedure: APPLICATION, WOUND VAC;  Surgeon: Magda Debby SAILOR, MD;  Location: [facility] OR;  Service: Vascular;  Laterality: Left;   ATRIAL FIBRILLATION ABLATION N/A 01/29/2013   Procedure: ATRIAL FIBRILLATION ABLATION;  Surgeon: Lynwood JONETTA Rakers, MD;  Location: [facility] CATH LAB;  Service: Cardiovascular;  Laterality: N/A;   ATRIAL FIBRILLATION ABLATION N/A 11/15/2021   Procedure: ATRIAL FIBRILLATION ABLATION;  Surgeon: Cindie Ole DASEN, MD;  Location: [facility] INVASIVE CV LAB;  Service: Cardiovascular;  Laterality: N/A;   CARDIAC CATHETERIZATION  08/03/2011   COLONOSCOPY WITH PROPOFOL  N/A 02/02/2016   Procedure: COLONOSCOPY WITH PROPOFOL ;  Surgeon: Rogelia Copping, MD;  Location: [facility] ENDOSCOPY;  Service: Endoscopy;  Laterality: N/A;   COLONOSCOPY WITH PROPOFOL  N/A 04/20/2023   Procedure: COLONOSCOPY WITH PROPOFOL ;  Surgeon: Copping Rogelia, MD;  Location: [facility] ENDOSCOPY;  Service: Endoscopy;  Laterality: N/A;   CRANIOTOMY  Left 01/29/2024   Procedure: CRANIOTOMY TUMOR EXCISION;  Surgeon: Rosslyn Dino HERO, MD;  Location: [facility] OR;  Service: Neurosurgery;  Laterality: Left;  LEFT TEMPORAL MASS RESCETION   ENDARTERECTOMY FEMORAL Left 02/09/2024   Procedure: LEFT FEMORAL ENDARTERECTOMY;  Surgeon: Magda Debby SAILOR, MD;  Location: [facility] OR;  Service: Vascular;  Laterality: Left;   FASCIECTOMY, LOWER EXTREMITY Left 02/09/2024   Procedure: 4 COMPARTMENT FASCIECTOMY, LEFT LOWER EXTREMITY;  Surgeon: Magda Debby SAILOR, MD;  Location: [facility] OR;  Service: Vascular;  Laterality: Left;   LOWER EXTREMITY ANGIOGRAM Left 02/09/2024   Procedure: INTRAOPERATIVE LEFT LOWER EXTREMITY ANGIOGRAM;  Surgeon: Magda Debby SAILOR, MD;  Location: [facility] OR;  Service: Vascular;  Laterality: Left;   LUMBAR LAMINECTOMY/DECOMPRESSION MICRODISCECTOMY N/A 06/07/2017   Procedure: LUMBAR LAMINECTOMY/DECOMPRESSION MICRODISCECTOMY 2 LEVELS-L3-4,L4-5;  Surgeon: Clois Fret, MD;  Location: [facility] ORS;  Service: Neurosurgery;  Laterality: N/A;   PATCH ANGIOPLASTY  02/09/2024   Procedure: ANGIOPLASTY LEFT FEMORAL ARTERY USING XENOSURE BOVINE PERICARDIUM PATCH;  Surgeon: Magda Debby SAILOR, MD;  Location: [facility] OR;  Service: Vascular;;   POLYPECTOMY  04/20/2023   Procedure: POLYPECTOMY;  Surgeon: Copping Rogelia, MD;  Location: [facility] ENDOSCOPY;  Service: Endoscopy;;   RIGHT/LEFT HEART CATH AND CORONARY ANGIOGRAPHY N/A 03/06/2020   Procedure: RIGHT/LEFT HEART CATH AND CORONARY ANGIOGRAPHY;  Surgeon: Perla Evalene PARAS, MD;  Location: [facility] INVASIVE CV LAB;  Service: Cardiovascular;  Laterality: N/A;   TEE WITHOUT CARDIOVERSION N/A 01/28/2013   Procedure: TRANSESOPHAGEAL ECHOCARDIOGRAM (TEE);  Surgeon: Redell GORMAN Shallow, MD;  Location: [facility] ENDOSCOPY;  Service: Cardiovascular;  Laterality: N/A;   THROMBECTOMY OF BYPASS GRAFT FEMORAL- PERONEAL ARTERY Left 02/09/2024   Procedure: THROMBECTOMY OF LEFT FEMORAL AND POTERIOR TIBIAL  ARTERY;  Surgeon: Magda Debby SAILOR, MD;  Location: [facility] OR;  Service:  Vascular;  Laterality: Left;   Social History:  Social History   Socioeconomic History   Marital status: Married    Spouse name: Not on file   Number of children: 2   Years of education: Not on file   Highest education level: Not on file  Occupational History   Not on file  Tobacco Use   Smoking status: Former    Current packs/day: 0.00    Average packs/day: 1.5 packs/day for 35.0 years (52.5 ttl pk-yrs)    Types: Cigarettes    Start date: 01/15/1973  Quit date: 01/16/2008    Years since quitting: 16.3   Smokeless tobacco: Never   Tobacco comments:    Former smoker 12/09/21  Vaping Use   Vaping status: Never Used  Substance and Sexual Activity   Alcohol use: Not Currently    Comment: occasional   Drug use: No   Sexual activity: Not Currently  Other Topics Concern   Not on file  Social History Narrative   Pt lives in [place] (right outside of [place])   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 [place]   Social Drivers of Health   Tobacco Use: Medium Risk (05/03/2024)   Patient History    Smoking Tobacco Use: Former    Smokeless Tobacco Use: Never    Passive Exposure: Not on Actuary Strain: Low Risk  (01/31/2023)   Received from [facility]   Overall Financial Resource Strain (CARDIA)    Difficulty of Paying Living Expenses: Not hard at all  Food Insecurity: No Food Insecurity (03/08/2024)   Epic    Worried About Radiation Protection Practitioner of Food in the Last Year: Never true    Ran Out of Food in the Last Year: Never true  Transportation Needs: No Transportation Needs (03/08/2024)   Epic    Lack of Transportation (Medical): No    Lack of Transportation (Non-Medical): No  Physical Activity: Not on file  Stress: Not on file  Social Connections: Unknown (03/11/2024)   Social Connection and Isolation Panel    Frequency of Communication with Friends and Family: Not on file    Frequency of Social Gatherings with Friends and  Family: Once a week    Attends Religious Services: Not on file    Active Member of Clubs or Organizations: Yes    Attends Banker Meetings: Not on file    Marital Status: Not on file  Intimate Partner Violence: Not At Risk (03/08/2024)   Epic    Fear of Current or Ex-Partner: No    Emotionally Abused: No    Physically Abused: No    Sexually Abused: No  Depression (PHQ2-9): Low Risk (02/08/2024)   Depression (PHQ2-9)    PHQ-2 Score: 0  Alcohol Screen: Not on file  Housing: Low Risk (03/08/2024)   Epic    Unable to Pay for Housing in the Last Year: No    Number of Times Moved in the Last Year: 0    Homeless in the Last Year: No  Utilities: At Risk (03/08/2024)   Epic    Threatened with loss of utilities: Yes  Health Literacy: Not on file   Family History:  Family History  Problem Relation Age of Onset   Stroke Mother    Heart disease Mother        pacemaker   Heart attack Father 64   Throat cancer Father        throat   Heart disease Paternal Grandfather        MI   Diabetes Son    Heart attack Maternal Uncle     Review of Systems: Constitutional: Doesn't report fevers, chills or abnormal weight loss Eyes: Doesn't report blurriness of vision Ears, nose, mouth, throat, and face: Doesn't report sore throat Respiratory: Doesn't report cough, dyspnea or wheezes Cardiovascular: Doesn't report palpitation, chest discomfort  Gastrointestinal:  Doesn't report nausea, constipation, diarrhea GU: Doesn't report incontinence Skin: Doesn't report skin rashes Neurological: Per HPI Musculoskeletal: Doesn't report joint pain Behavioral/Psych: Doesn't report anxiety  Physical  Exam: Vitals:   05/03/24 0934  BP: 136/62  Pulse: 77  Resp: 20  Temp: 97.6 F (36.4 C)  SpO2: 100%    KPS: 80. General: Alert, cooperative, pleasant, in no acute distress Head: Normal EENT: No conjunctival injection or scleral icterus.  Lungs: Resp effort normal Cardiac: Regular  rate Abdomen: Non-distended abdomen Skin: No rashes cyanosis or petechiae. Extremities: Edema right lower leg, Left leg AKA amputation  Neurologic Exam: Mental Status: Awake, alert, attentive to examiner. Oriented to self and environment. Language is fluent with intact comprehension.  Cranial Nerves: Visual acuity is grossly normal. Visual fields are full. Extra-ocular movements intact. No ptosis. Face is symmetric Motor: Tone and bulk are normal. Power is full in both arms right leg. Reflexes are symmetric, no pathologic reflexes present.  Sensory: Intact to light touch Gait: Non ambulatory   Labs: I have reviewed the data as listed    Component Value Date/Time   NA 133 (L) 03/26/2024 0426   NA 140 06/28/2023 0946   NA 131 (L) 03/02/2014 1416   K 4.6 03/26/2024 0426   K 4.1 03/02/2014 1416   CL 100 03/26/2024 0426   CL 98 03/02/2014 1416   CO2 23 03/26/2024 0426   CO2 24 03/02/2014 1416   GLUCOSE 152 (H) 03/26/2024 0426   GLUCOSE 374 (H) 03/02/2014 1416   BUN 30 (H) 03/26/2024 0426   BUN 26 06/28/2023 0946   BUN 18 03/02/2014 1416   CREATININE 1.02 03/26/2024 0426   CREATININE 1.27 03/02/2014 1416   CALCIUM  8.7 (L) 03/26/2024 0426   CALCIUM  8.7 03/02/2014 1416   PROT 6.2 (L) 03/16/2024 0547   PROT 7.0 02/20/2020 0925   PROT 7.8 03/02/2014 1416   ALBUMIN  2.5 (L) 03/16/2024 0547   ALBUMIN  3.9 02/20/2020 0925   ALBUMIN  3.1 (L) 03/02/2014 1416   AST 23 03/16/2024 0547   AST 30 03/02/2014 1416   ALT 24 03/16/2024 0547   ALT 45 03/02/2014 1416   ALKPHOS 92 03/16/2024 0547   ALKPHOS 140 (H) 03/02/2014 1416   BILITOT 0.7 03/16/2024 0547   BILITOT 0.9 02/20/2020 0925   BILITOT 0.6 03/02/2014 1416   GFRNONAA >60 03/26/2024 0426   GFRNONAA >60 03/02/2014 1416   GFRNONAA >60 01/12/2013 0013   GFRAA 53 (L) 02/20/2020 0925   GFRAA >60 03/02/2014 1416   GFRAA >60 01/12/2013 0013   Lab Results  Component Value Date   WBC 6.2 05/03/2024   NEUTROABS 4.8 05/03/2024   HGB  13.6 05/03/2024   HCT 40.7 05/03/2024   MCV 92.9 05/03/2024   PLT 172 05/03/2024     Assessment/Plan Glioblastoma (HCC)  Focal seizures (HCC)  We appreciate the opportunity to participate in the care of HEYDEN JABER.  We are encouraged to see him again, after significant delay in treatment plan due to medical comorbidity, left leg amputation.  He is tolerating treatment well thus far, labs are within normal limits.  We ultimately recommended continuing with course of intensity modulated radiation therapy and concurrent daily Temozolomide .  Radiation will be administered Mon-Fri over 6 weeks, Temodar  will be dosed at 75mg /m2 to be given daily over 42 days.  We reviewed side effects of temodar , including fatigue, nausea/vomiting, constipation, and cytopenias.  Chemotherapy should be held for the following:  ANC less than 1,000  Platelets less than 100,000  LFT or creatinine greater than 2x ULN  If clinical concerns/contraindications develop  Every 2 weeks during radiation, labs will be checked accompanied by a clinical evaluation  in the brain tumor clinic.  Recommended decreasing decadron  by 1mg  each week, starting with 3mg  daily tomorrow.  Dose may be modified if focal symptoms recur.    Will otherwise continue Keppra  500mg  BID as AED.  If mood swings persist after decadron  taper, we can consider alternate AED regimen.  All questions were answered. The patient knows to call the clinic with any problems, questions or concerns. No barriers to learning were detected.  I personally spent a total of 40 minutes in the care of the patient today, including counseling and review of test results.       Samar Venneman K Chrystopher Stangl, MD Medical Director of Neuro-Oncology [facility] [place] 05/03/24 9:46 AM "

## 2024-05-06 ENCOUNTER — Ambulatory Visit

## 2024-05-06 ENCOUNTER — Other Ambulatory Visit: Payer: Self-pay | Admitting: Internal Medicine

## 2024-05-06 DIAGNOSIS — C719 Malignant neoplasm of brain, unspecified: Secondary | ICD-10-CM

## 2024-05-07 ENCOUNTER — Ambulatory Visit: Admitting: Speech Pathology

## 2024-05-07 ENCOUNTER — Other Ambulatory Visit: Payer: Self-pay

## 2024-05-07 ENCOUNTER — Ambulatory Visit
Admission: RE | Admit: 2024-05-07 | Discharge: 2024-05-07 | Attending: Radiation Oncology | Admitting: Radiation Oncology

## 2024-05-07 LAB — RAD ONC ARIA SESSION SUMMARY
Course Elapsed Days: 15
Plan Fractions Treated to Date: 8
Plan Prescribed Dose Per Fraction: 2 Gy
Plan Total Fractions Prescribed: 30
Plan Total Prescribed Dose: 60 Gy
Reference Point Dosage Given to Date: 16 Gy
Reference Point Session Dosage Given: 2 Gy
Session Number: 8

## 2024-05-08 ENCOUNTER — Other Ambulatory Visit: Payer: Self-pay

## 2024-05-08 ENCOUNTER — Ambulatory Visit
Admission: RE | Admit: 2024-05-08 | Discharge: 2024-05-08 | Disposition: A | Source: Ambulatory Visit | Attending: Radiation Oncology | Admitting: Radiation Oncology

## 2024-05-08 ENCOUNTER — Other Ambulatory Visit (HOSPITAL_COMMUNITY): Payer: Self-pay

## 2024-05-08 LAB — RAD ONC ARIA SESSION SUMMARY
Course Elapsed Days: 16
Plan Fractions Treated to Date: 9
Plan Prescribed Dose Per Fraction: 2 Gy
Plan Total Fractions Prescribed: 30
Plan Total Prescribed Dose: 60 Gy
Reference Point Dosage Given to Date: 18 Gy
Reference Point Session Dosage Given: 2 Gy
Session Number: 9

## 2024-05-09 ENCOUNTER — Inpatient Hospital Stay

## 2024-05-09 ENCOUNTER — Other Ambulatory Visit: Payer: Self-pay

## 2024-05-09 ENCOUNTER — Ambulatory Visit
Admission: RE | Admit: 2024-05-09 | Discharge: 2024-05-09 | Disposition: A | Source: Ambulatory Visit | Attending: Radiation Oncology | Admitting: Radiation Oncology

## 2024-05-09 ENCOUNTER — Ambulatory Visit: Admitting: Speech Pathology

## 2024-05-09 DIAGNOSIS — C719 Malignant neoplasm of brain, unspecified: Secondary | ICD-10-CM

## 2024-05-09 LAB — RAD ONC ARIA SESSION SUMMARY
Course Elapsed Days: 17
Plan Fractions Treated to Date: 10
Plan Prescribed Dose Per Fraction: 2 Gy
Plan Total Fractions Prescribed: 30
Plan Total Prescribed Dose: 60 Gy
Reference Point Dosage Given to Date: 20 Gy
Reference Point Session Dosage Given: 2 Gy
Session Number: 10

## 2024-05-09 LAB — CBC (CANCER CENTER ONLY)
HCT: 43.1 % (ref 39.0–52.0)
Hemoglobin: 13.9 g/dL (ref 13.0–17.0)
MCH: 30.4 pg (ref 26.0–34.0)
MCHC: 32.3 g/dL (ref 30.0–36.0)
MCV: 94.3 fL (ref 80.0–100.0)
Platelet Count: 204 10*3/uL (ref 150–400)
RBC: 4.57 MIL/uL (ref 4.22–5.81)
RDW: 15.2 % (ref 11.5–15.5)
WBC Count: 7.7 10*3/uL (ref 4.0–10.5)
nRBC: 0 % (ref 0.0–0.2)

## 2024-05-10 ENCOUNTER — Ambulatory Visit: Admission: RE | Admit: 2024-05-10 | Source: Ambulatory Visit

## 2024-05-10 ENCOUNTER — Other Ambulatory Visit: Payer: Self-pay

## 2024-05-10 LAB — RAD ONC ARIA SESSION SUMMARY
Course Elapsed Days: 18
Plan Fractions Treated to Date: 11
Plan Prescribed Dose Per Fraction: 2 Gy
Plan Total Fractions Prescribed: 30
Plan Total Prescribed Dose: 60 Gy
Reference Point Dosage Given to Date: 22 Gy
Reference Point Session Dosage Given: 2 Gy
Session Number: 11

## 2024-05-13 ENCOUNTER — Ambulatory Visit

## 2024-05-14 ENCOUNTER — Ambulatory Visit: Admitting: Speech Pathology

## 2024-05-14 ENCOUNTER — Ambulatory Visit

## 2024-05-15 ENCOUNTER — Ambulatory Visit

## 2024-05-16 ENCOUNTER — Ambulatory Visit: Admitting: Speech Pathology

## 2024-05-16 ENCOUNTER — Inpatient Hospital Stay

## 2024-05-16 ENCOUNTER — Ambulatory Visit

## 2024-05-17 ENCOUNTER — Ambulatory Visit

## 2024-05-17 ENCOUNTER — Inpatient Hospital Stay

## 2024-05-17 ENCOUNTER — Inpatient Hospital Stay: Admitting: Internal Medicine

## 2024-05-20 ENCOUNTER — Ambulatory Visit

## 2024-05-21 ENCOUNTER — Ambulatory Visit

## 2024-05-21 ENCOUNTER — Ambulatory Visit: Admitting: Speech Pathology

## 2024-05-22 ENCOUNTER — Ambulatory Visit

## 2024-05-23 ENCOUNTER — Ambulatory Visit

## 2024-05-23 ENCOUNTER — Inpatient Hospital Stay

## 2024-05-23 ENCOUNTER — Ambulatory Visit: Admitting: Speech Pathology

## 2024-05-24 ENCOUNTER — Ambulatory Visit

## 2024-05-27 ENCOUNTER — Ambulatory Visit

## 2024-05-28 ENCOUNTER — Ambulatory Visit

## 2024-05-29 ENCOUNTER — Ambulatory Visit

## 2024-05-30 ENCOUNTER — Ambulatory Visit

## 2024-05-31 ENCOUNTER — Inpatient Hospital Stay

## 2024-05-31 ENCOUNTER — Ambulatory Visit

## 2024-05-31 ENCOUNTER — Inpatient Hospital Stay: Admitting: Internal Medicine

## 2024-06-03 ENCOUNTER — Ambulatory Visit

## 2024-06-04 ENCOUNTER — Ambulatory Visit

## 2024-06-05 ENCOUNTER — Ambulatory Visit

## 2024-06-06 ENCOUNTER — Ambulatory Visit

## 2024-07-15 ENCOUNTER — Ambulatory Visit: Admitting: Cardiovascular Disease
